# Patient Record
Sex: Female | Born: 1935 | Race: Black or African American | Hispanic: No | Smoking: Former smoker
Health system: Southern US, Community
[De-identification: ages and names within clinical notes are randomized; demographics above are authoritative.]

## PROBLEM LIST (undated history)

## (undated) DIAGNOSIS — K5792 Diverticulitis of intestine, part unspecified, without perforation or abscess without bleeding: Secondary | ICD-10-CM

## (undated) DIAGNOSIS — I350 Nonrheumatic aortic (valve) stenosis: Secondary | ICD-10-CM

## (undated) DIAGNOSIS — D649 Anemia, unspecified: Secondary | ICD-10-CM

## (undated) DIAGNOSIS — R351 Nocturia: Secondary | ICD-10-CM

## (undated) DIAGNOSIS — M2550 Pain in unspecified joint: Secondary | ICD-10-CM

## (undated) DIAGNOSIS — N183 Chronic kidney disease, stage 3 unspecified: Secondary | ICD-10-CM

## (undated) DIAGNOSIS — Z9289 Personal history of other medical treatment: Secondary | ICD-10-CM

## (undated) DIAGNOSIS — IMO0001 Reserved for inherently not codable concepts without codable children: Secondary | ICD-10-CM

## (undated) DIAGNOSIS — K219 Gastro-esophageal reflux disease without esophagitis: Secondary | ICD-10-CM

## (undated) DIAGNOSIS — R609 Edema, unspecified: Secondary | ICD-10-CM

## (undated) DIAGNOSIS — R39198 Other difficulties with micturition: Secondary | ICD-10-CM

## (undated) DIAGNOSIS — M2540 Effusion, unspecified joint: Secondary | ICD-10-CM

## (undated) DIAGNOSIS — M1990 Unspecified osteoarthritis, unspecified site: Secondary | ICD-10-CM

## (undated) DIAGNOSIS — E7800 Pure hypercholesterolemia, unspecified: Secondary | ICD-10-CM

## (undated) DIAGNOSIS — R600 Localized edema: Secondary | ICD-10-CM

## (undated) DIAGNOSIS — Z8719 Personal history of other diseases of the digestive system: Secondary | ICD-10-CM

## (undated) DIAGNOSIS — K921 Melena: Secondary | ICD-10-CM

## (undated) DIAGNOSIS — I10 Essential (primary) hypertension: Secondary | ICD-10-CM

## (undated) DIAGNOSIS — R200 Anesthesia of skin: Secondary | ICD-10-CM

## (undated) HISTORY — PX: ABCESS DRAINAGE: SHX399

## (undated) HISTORY — PX: OTHER SURGICAL HISTORY: SHX169

## (undated) HISTORY — PX: CHOLECYSTECTOMY: SHX55

## (undated) HISTORY — PX: DILATION AND CURETTAGE OF UTERUS: SHX78

---

## 1997-05-21 ENCOUNTER — Other Ambulatory Visit: Admission: RE | Admit: 1997-05-21 | Discharge: 1997-05-21 | Payer: Self-pay | Admitting: Family Medicine

## 1997-06-28 ENCOUNTER — Ambulatory Visit (HOSPITAL_COMMUNITY): Admission: RE | Admit: 1997-06-28 | Discharge: 1997-06-28 | Payer: Self-pay | Admitting: Family Medicine

## 1997-06-28 ENCOUNTER — Other Ambulatory Visit: Admission: RE | Admit: 1997-06-28 | Discharge: 1997-06-28 | Payer: Self-pay | Admitting: Family Medicine

## 1997-08-04 ENCOUNTER — Ambulatory Visit (HOSPITAL_COMMUNITY): Admission: RE | Admit: 1997-08-04 | Discharge: 1997-08-04 | Payer: Self-pay | Admitting: Family Medicine

## 1997-08-27 ENCOUNTER — Encounter: Admission: RE | Admit: 1997-08-27 | Discharge: 1997-11-25 | Payer: Self-pay | Admitting: Specialist

## 1998-03-09 ENCOUNTER — Inpatient Hospital Stay (HOSPITAL_COMMUNITY): Admission: EM | Admit: 1998-03-09 | Discharge: 1998-03-13 | Payer: Self-pay | Admitting: Emergency Medicine

## 1998-03-09 ENCOUNTER — Encounter: Payer: Self-pay | Admitting: Emergency Medicine

## 1998-03-11 ENCOUNTER — Encounter: Payer: Self-pay | Admitting: Internal Medicine

## 1998-05-26 ENCOUNTER — Encounter: Payer: Self-pay | Admitting: Internal Medicine

## 1998-05-26 ENCOUNTER — Ambulatory Visit (HOSPITAL_COMMUNITY): Admission: RE | Admit: 1998-05-26 | Discharge: 1998-05-26 | Payer: Self-pay | Admitting: Internal Medicine

## 1998-10-04 ENCOUNTER — Ambulatory Visit (HOSPITAL_COMMUNITY): Admission: RE | Admit: 1998-10-04 | Discharge: 1998-10-04 | Payer: Self-pay | Admitting: Gastroenterology

## 1999-02-25 ENCOUNTER — Emergency Department (HOSPITAL_COMMUNITY): Admission: EM | Admit: 1999-02-25 | Discharge: 1999-02-25 | Payer: Self-pay | Admitting: Emergency Medicine

## 2001-04-26 ENCOUNTER — Inpatient Hospital Stay (HOSPITAL_COMMUNITY): Admission: EM | Admit: 2001-04-26 | Discharge: 2001-04-27 | Payer: Self-pay

## 2001-05-14 ENCOUNTER — Encounter: Payer: Self-pay | Admitting: Internal Medicine

## 2001-05-14 ENCOUNTER — Ambulatory Visit (HOSPITAL_COMMUNITY): Admission: RE | Admit: 2001-05-14 | Discharge: 2001-05-14 | Payer: Self-pay | Admitting: Internal Medicine

## 2001-05-15 ENCOUNTER — Encounter: Admission: RE | Admit: 2001-05-15 | Discharge: 2001-05-15 | Payer: Self-pay | Admitting: Family Medicine

## 2001-05-21 ENCOUNTER — Encounter: Payer: Self-pay | Admitting: Cardiology

## 2001-05-21 ENCOUNTER — Ambulatory Visit (HOSPITAL_COMMUNITY): Admission: RE | Admit: 2001-05-21 | Discharge: 2001-05-22 | Payer: Self-pay | Admitting: Cardiology

## 2001-10-30 ENCOUNTER — Encounter: Admission: RE | Admit: 2001-10-30 | Discharge: 2001-11-25 | Payer: Self-pay | Admitting: Internal Medicine

## 2002-10-02 ENCOUNTER — Encounter: Admission: RE | Admit: 2002-10-02 | Discharge: 2002-10-02 | Payer: Self-pay | Admitting: Specialist

## 2002-10-02 ENCOUNTER — Encounter: Payer: Self-pay | Admitting: Specialist

## 2002-11-19 ENCOUNTER — Ambulatory Visit (HOSPITAL_COMMUNITY): Admission: RE | Admit: 2002-11-19 | Discharge: 2002-11-19 | Payer: Self-pay | Admitting: Internal Medicine

## 2002-11-19 ENCOUNTER — Encounter: Payer: Self-pay | Admitting: Internal Medicine

## 2003-05-31 ENCOUNTER — Emergency Department (HOSPITAL_COMMUNITY): Admission: EM | Admit: 2003-05-31 | Discharge: 2003-05-31 | Payer: Self-pay | Admitting: Family Medicine

## 2003-06-01 ENCOUNTER — Emergency Department (HOSPITAL_COMMUNITY): Admission: EM | Admit: 2003-06-01 | Discharge: 2003-06-01 | Payer: Self-pay | Admitting: Family Medicine

## 2003-06-04 ENCOUNTER — Emergency Department (HOSPITAL_COMMUNITY): Admission: EM | Admit: 2003-06-04 | Discharge: 2003-06-04 | Payer: Self-pay | Admitting: Family Medicine

## 2003-09-14 ENCOUNTER — Ambulatory Visit (HOSPITAL_COMMUNITY): Admission: RE | Admit: 2003-09-14 | Discharge: 2003-09-14 | Payer: Self-pay | Admitting: Internal Medicine

## 2003-12-21 ENCOUNTER — Ambulatory Visit: Payer: Self-pay | Admitting: Nurse Practitioner

## 2003-12-21 ENCOUNTER — Ambulatory Visit (HOSPITAL_COMMUNITY): Admission: RE | Admit: 2003-12-21 | Discharge: 2003-12-21 | Payer: Self-pay | Admitting: Nurse Practitioner

## 2004-01-20 ENCOUNTER — Ambulatory Visit: Payer: Self-pay | Admitting: Nurse Practitioner

## 2004-02-15 ENCOUNTER — Ambulatory Visit: Payer: Self-pay | Admitting: Nurse Practitioner

## 2004-04-21 ENCOUNTER — Ambulatory Visit (HOSPITAL_COMMUNITY): Admission: RE | Admit: 2004-04-21 | Discharge: 2004-04-21 | Payer: Self-pay | Admitting: Nurse Practitioner

## 2004-10-25 ENCOUNTER — Observation Stay (HOSPITAL_COMMUNITY): Admission: EM | Admit: 2004-10-25 | Discharge: 2004-10-27 | Payer: Self-pay | Admitting: Emergency Medicine

## 2005-01-08 ENCOUNTER — Encounter: Admission: RE | Admit: 2005-01-08 | Discharge: 2005-01-08 | Payer: Self-pay | Admitting: Cardiovascular Disease

## 2005-01-11 ENCOUNTER — Encounter: Admission: RE | Admit: 2005-01-11 | Discharge: 2005-01-11 | Payer: Self-pay | Admitting: Gastroenterology

## 2005-03-10 ENCOUNTER — Emergency Department (HOSPITAL_COMMUNITY): Admission: EM | Admit: 2005-03-10 | Discharge: 2005-03-10 | Payer: Self-pay | Admitting: Emergency Medicine

## 2005-04-11 ENCOUNTER — Ambulatory Visit (HOSPITAL_COMMUNITY): Admission: RE | Admit: 2005-04-11 | Discharge: 2005-04-11 | Payer: Self-pay | Admitting: Cardiovascular Disease

## 2005-05-22 ENCOUNTER — Ambulatory Visit (HOSPITAL_COMMUNITY): Admission: RE | Admit: 2005-05-22 | Discharge: 2005-05-23 | Payer: Self-pay | Admitting: General Surgery

## 2005-05-22 ENCOUNTER — Encounter (INDEPENDENT_AMBULATORY_CARE_PROVIDER_SITE_OTHER): Payer: Self-pay | Admitting: *Deleted

## 2005-09-21 ENCOUNTER — Ambulatory Visit: Payer: Self-pay | Admitting: Nurse Practitioner

## 2005-12-03 ENCOUNTER — Inpatient Hospital Stay (HOSPITAL_COMMUNITY): Admission: EM | Admit: 2005-12-03 | Discharge: 2005-12-05 | Payer: Self-pay | Admitting: Emergency Medicine

## 2006-05-26 ENCOUNTER — Inpatient Hospital Stay (HOSPITAL_COMMUNITY): Admission: EM | Admit: 2006-05-26 | Discharge: 2006-05-28 | Payer: Self-pay | Admitting: Emergency Medicine

## 2006-05-27 ENCOUNTER — Encounter (INDEPENDENT_AMBULATORY_CARE_PROVIDER_SITE_OTHER): Payer: Self-pay | Admitting: Specialist

## 2007-01-21 ENCOUNTER — Encounter: Admission: RE | Admit: 2007-01-21 | Discharge: 2007-01-21 | Payer: Self-pay | Admitting: Cardiovascular Disease

## 2007-03-29 ENCOUNTER — Observation Stay (HOSPITAL_COMMUNITY): Admission: EM | Admit: 2007-03-29 | Discharge: 2007-04-01 | Payer: Self-pay | Admitting: Emergency Medicine

## 2007-03-29 ENCOUNTER — Encounter: Admission: RE | Admit: 2007-03-29 | Discharge: 2007-03-29 | Payer: Self-pay | Admitting: Cardiovascular Disease

## 2007-08-25 ENCOUNTER — Encounter: Admission: RE | Admit: 2007-08-25 | Discharge: 2007-08-25 | Payer: Self-pay | Admitting: Cardiovascular Disease

## 2007-10-03 ENCOUNTER — Ambulatory Visit (HOSPITAL_COMMUNITY): Admission: RE | Admit: 2007-10-03 | Discharge: 2007-10-04 | Payer: Self-pay | Admitting: Cardiovascular Disease

## 2007-12-22 ENCOUNTER — Inpatient Hospital Stay (HOSPITAL_COMMUNITY): Admission: AD | Admit: 2007-12-22 | Discharge: 2007-12-26 | Payer: Self-pay | Admitting: Cardiovascular Disease

## 2007-12-24 ENCOUNTER — Encounter (INDEPENDENT_AMBULATORY_CARE_PROVIDER_SITE_OTHER): Payer: Self-pay | Admitting: Gastroenterology

## 2008-04-23 ENCOUNTER — Emergency Department (HOSPITAL_COMMUNITY): Admission: EM | Admit: 2008-04-23 | Discharge: 2008-04-23 | Payer: Self-pay | Admitting: Emergency Medicine

## 2008-05-06 ENCOUNTER — Inpatient Hospital Stay (HOSPITAL_COMMUNITY): Admission: EM | Admit: 2008-05-06 | Discharge: 2008-05-12 | Payer: Self-pay | Admitting: Emergency Medicine

## 2008-05-07 ENCOUNTER — Encounter (INDEPENDENT_AMBULATORY_CARE_PROVIDER_SITE_OTHER): Payer: Self-pay | Admitting: Cardiovascular Disease

## 2008-05-11 ENCOUNTER — Encounter (INDEPENDENT_AMBULATORY_CARE_PROVIDER_SITE_OTHER): Payer: Self-pay | Admitting: Gastroenterology

## 2008-07-30 ENCOUNTER — Ambulatory Visit (HOSPITAL_COMMUNITY): Admission: RE | Admit: 2008-07-30 | Discharge: 2008-07-30 | Payer: Self-pay | Admitting: *Deleted

## 2008-07-30 ENCOUNTER — Encounter (INDEPENDENT_AMBULATORY_CARE_PROVIDER_SITE_OTHER): Payer: Self-pay | Admitting: Gastroenterology

## 2009-01-05 ENCOUNTER — Inpatient Hospital Stay (HOSPITAL_COMMUNITY): Admission: EM | Admit: 2009-01-05 | Discharge: 2009-01-08 | Payer: Self-pay | Admitting: Emergency Medicine

## 2009-05-11 ENCOUNTER — Encounter: Admission: RE | Admit: 2009-05-11 | Discharge: 2009-05-11 | Payer: Self-pay | Admitting: Cardiovascular Disease

## 2009-05-21 ENCOUNTER — Encounter: Admission: RE | Admit: 2009-05-21 | Discharge: 2009-05-21 | Payer: Self-pay | Admitting: Cardiovascular Disease

## 2010-01-19 ENCOUNTER — Emergency Department (HOSPITAL_COMMUNITY): Admission: EM | Admit: 2010-01-19 | Discharge: 2009-11-20 | Payer: Self-pay | Admitting: Emergency Medicine

## 2010-04-26 ENCOUNTER — Other Ambulatory Visit: Payer: Self-pay | Admitting: Gastroenterology

## 2010-04-26 DIAGNOSIS — R195 Other fecal abnormalities: Secondary | ICD-10-CM

## 2010-04-27 LAB — POCT I-STAT, CHEM 8
BUN: 19 mg/dL (ref 6–23)
Calcium, Ion: 1.11 mmol/L — ABNORMAL LOW (ref 1.12–1.32)
Chloride: 105 mEq/L (ref 96–112)
Creatinine, Ser: 1.5 mg/dL — ABNORMAL HIGH (ref 0.4–1.2)
Glucose, Bld: 115 mg/dL — ABNORMAL HIGH (ref 70–99)
HCT: 35 % — ABNORMAL LOW (ref 36.0–46.0)
Hemoglobin: 11.9 g/dL — ABNORMAL LOW (ref 12.0–15.0)
Potassium: 3.5 mEq/L (ref 3.5–5.1)
Sodium: 140 mEq/L (ref 135–145)
TCO2: 24 mmol/L (ref 0–100)

## 2010-04-27 LAB — POCT CARDIAC MARKERS
CKMB, poc: <1 ng/mL — ABNORMAL LOW (ref 1.0–8.0)
Myoglobin, poc: 62 ng/mL (ref 12–200)
Troponin i, poc: <0.05 ng/mL (ref 0.00–0.09)

## 2010-05-02 ENCOUNTER — Ambulatory Visit
Admission: RE | Admit: 2010-05-02 | Discharge: 2010-05-02 | Disposition: A | Discharge status: Home / Self Care | Payer: Medicare Other | Source: Ambulatory Visit | Attending: Gastroenterology | Admitting: Gastroenterology

## 2010-05-02 ENCOUNTER — Other Ambulatory Visit: Payer: Self-pay | Admitting: Gastroenterology

## 2010-05-02 DIAGNOSIS — R195 Other fecal abnormalities: Secondary | ICD-10-CM

## 2010-05-17 LAB — LIPID PANEL
Cholesterol: 209 mg/dL — ABNORMAL HIGH (ref 0–200)
HDL: 56 mg/dL (ref >39)
LDL Cholesterol: 143 mg/dL — ABNORMAL HIGH (ref 0–99)
Total CHOL/HDL Ratio: 3.7 RATIO
Triglycerides: 51 mg/dL (ref <150)
VLDL: 10 mg/dL (ref 0–40)

## 2010-05-17 LAB — GLUCOSE, CAPILLARY
Glucose-Capillary: 119 mg/dL — ABNORMAL HIGH (ref 70–99)
Glucose-Capillary: 124 mg/dL — ABNORMAL HIGH (ref 70–99)
Glucose-Capillary: 158 mg/dL — ABNORMAL HIGH (ref 70–99)
Glucose-Capillary: 82 mg/dL (ref 70–99)
Glucose-Capillary: 86 mg/dL (ref 70–99)
Glucose-Capillary: 95 mg/dL (ref 70–99)
Glucose-Capillary: 98 mg/dL (ref 70–99)

## 2010-05-17 LAB — COMPREHENSIVE METABOLIC PANEL
ALT: 13 U/L (ref 0–35)
AST: 20 U/L (ref 0–37)
Albumin: 3.5 g/dL (ref 3.5–5.2)
Alkaline Phosphatase: 71 U/L (ref 39–117)
BUN: 18 mg/dL (ref 6–23)
CO2: 29 mEq/L (ref 19–32)
Calcium: 8.9 mg/dL (ref 8.4–10.5)
Chloride: 105 mEq/L (ref 96–112)
Creatinine, Ser: 1.25 mg/dL — ABNORMAL HIGH (ref 0.4–1.2)
GFR calc non Af Amer: 41 mL/min — ABNORMAL LOW (ref >60)
Glucose, Bld: 110 mg/dL — ABNORMAL HIGH (ref 70–99)
Potassium: 4.8 mEq/L (ref 3.5–5.1)
Sodium: 140 mEq/L (ref 135–145)
Total Bilirubin: 0.5 mg/dL (ref 0.3–1.2)
Total Protein: 7.1 g/dL (ref 6.0–8.3)

## 2010-05-17 LAB — CBC
HCT: 29.6 % — ABNORMAL LOW (ref 36.0–46.0)
HCT: 30.6 % — ABNORMAL LOW (ref 36.0–46.0)
HCT: 34.4 % — ABNORMAL LOW (ref 36.0–46.0)
Hemoglobin: 10.4 g/dL — ABNORMAL LOW (ref 12.0–15.0)
Hemoglobin: 11.6 g/dL — ABNORMAL LOW (ref 12.0–15.0)
Hemoglobin: 9.8 g/dL — ABNORMAL LOW (ref 12.0–15.0)
MCHC: 33 g/dL (ref 30.0–36.0)
MCHC: 33.8 g/dL (ref 30.0–36.0)
MCHC: 34 g/dL (ref 30.0–36.0)
MCV: 92.3 fL (ref 78.0–100.0)
MCV: 92.6 fL (ref 78.0–100.0)
MCV: 93.3 fL (ref 78.0–100.0)
Platelets: 251 10*3/uL (ref 150–400)
Platelets: 258 10*3/uL (ref 150–400)
Platelets: 270 10*3/uL (ref 150–400)
RBC: 3.17 MIL/uL — ABNORMAL LOW (ref 3.87–5.11)
RBC: 3.32 MIL/uL — ABNORMAL LOW (ref 3.87–5.11)
RBC: 3.72 MIL/uL — ABNORMAL LOW (ref 3.87–5.11)
RDW: 14.8 % (ref 11.5–15.5)
RDW: 14.8 % (ref 11.5–15.5)
RDW: 14.9 % (ref 11.5–15.5)
WBC: 4 10*3/uL (ref 4.0–10.5)
WBC: 4 10*3/uL (ref 4.0–10.5)
WBC: 4.2 10*3/uL (ref 4.0–10.5)

## 2010-05-17 LAB — CK TOTAL AND CKMB (NOT AT ARMC)
CK, MB: 0.7 ng/mL (ref 0.3–4.0)
CK, MB: 0.7 ng/mL (ref 0.3–4.0)
CK, MB: 0.9 ng/mL (ref 0.3–4.0)
Relative Index: INVALID (ref 0.0–2.5)
Relative Index: INVALID (ref 0.0–2.5)
Total CK: 46 U/L (ref 7–177)
Total CK: 57 U/L (ref 7–177)
Total CK: 62 U/L (ref 7–177)

## 2010-05-17 LAB — PROTIME-INR
INR: 1 (ref 0.00–1.49)
Prothrombin Time: 13.1 seconds (ref 11.6–15.2)

## 2010-05-17 LAB — BASIC METABOLIC PANEL
BUN: 18 mg/dL (ref 6–23)
BUN: 20 mg/dL (ref 6–23)
CO2: 25 mEq/L (ref 19–32)
CO2: 28 mEq/L (ref 19–32)
Calcium: 8.7 mg/dL (ref 8.4–10.5)
Calcium: 9 mg/dL (ref 8.4–10.5)
Chloride: 104 mEq/L (ref 96–112)
Chloride: 104 mEq/L (ref 96–112)
Creatinine, Ser: 1.18 mg/dL (ref 0.4–1.2)
Creatinine, Ser: 1.28 mg/dL — ABNORMAL HIGH (ref 0.4–1.2)
GFR calc Af Amer: 53 mL/min — ABNORMAL LOW (ref >60)
GFR calc non Af Amer: 40 mL/min — ABNORMAL LOW (ref >60)
GFR calc non Af Amer: 44 mL/min — ABNORMAL LOW (ref >60)
Glucose, Bld: 117 mg/dL — ABNORMAL HIGH (ref 70–99)
Glucose, Bld: 158 mg/dL — ABNORMAL HIGH (ref 70–99)
Potassium: 3.6 mEq/L (ref 3.5–5.1)
Potassium: 3.9 mEq/L (ref 3.5–5.1)
Sodium: 137 mEq/L (ref 135–145)
Sodium: 138 mEq/L (ref 135–145)

## 2010-05-17 LAB — APTT

## 2010-05-17 LAB — RETICULOCYTES
RBC.: 3.39 MIL/uL — ABNORMAL LOW (ref 3.87–5.11)
Retic Count, Absolute: 27.1 10*3/uL (ref 19.0–186.0)
Retic Ct Pct: 0.8 % (ref 0.4–3.1)

## 2010-05-17 LAB — DIFFERENTIAL
Basophils Absolute: 0 10*3/uL (ref 0.0–0.1)
Basophils Relative: 1 % (ref 0–1)
Eosinophils Absolute: 0.2 10*3/uL (ref 0.0–0.7)
Eosinophils Relative: 4 % (ref 0–5)
Lymphocytes Relative: 31 % (ref 12–46)
Lymphs Abs: 1.3 10*3/uL (ref 0.7–4.0)
Monocytes Absolute: 0.3 10*3/uL (ref 0.1–1.0)
Monocytes Relative: 8 % (ref 3–12)
Neutro Abs: 2.4 10*3/uL (ref 1.7–7.7)
Neutrophils Relative %: 57 % (ref 43–77)

## 2010-05-17 LAB — FERRITIN: Ferritin: 57 ng/mL (ref 10–291)

## 2010-05-17 LAB — TROPONIN I
Troponin I: 0.02 ng/mL (ref 0.00–0.06)
Troponin I: 0.02 ng/mL (ref 0.00–0.06)

## 2010-05-17 LAB — D-DIMER, QUANTITATIVE

## 2010-05-17 LAB — HEMOCCULT GUIAC POC 1CARD (OFFICE): Fecal Occult Bld: POSITIVE

## 2010-05-17 LAB — IRON AND TIBC
Iron: 59 ug/dL (ref 42–135)
Saturation Ratios: 26 % (ref 20–55)
TIBC: 227 ug/dL — ABNORMAL LOW (ref 250–470)
UIBC: 168 ug/dL

## 2010-05-17 LAB — HEPARIN LEVEL (UNFRACTIONATED)
Heparin Unfractionated: 0.67 IU/mL (ref 0.30–0.70)
Heparin Unfractionated: 0.75 IU/mL — ABNORMAL HIGH (ref 0.30–0.70)
Heparin Unfractionated: 1.03 IU/mL — ABNORMAL HIGH (ref 0.30–0.70)

## 2010-05-17 LAB — BRAIN NATRIURETIC PEPTIDE: Pro B Natriuretic peptide (BNP): 67 pg/mL (ref 0.0–100.0)

## 2010-05-17 LAB — HEMOGLOBIN A1C: Mean Plasma Glucose: 131 mg/dL

## 2010-05-17 LAB — VITAMIN B12: Vitamin B-12: 457 pg/mL (ref 211–911)

## 2010-05-17 LAB — FOLATE: Folate: 16.1 ng/mL

## 2010-05-25 LAB — DIFFERENTIAL
Basophils Absolute: 0 10*3/uL (ref 0.0–0.1)
Basophils Absolute: 0.1 10*3/uL (ref 0.0–0.1)
Basophils Relative: 1 % (ref 0–1)
Basophils Relative: 1 % (ref 0–1)
Eosinophils Absolute: 0.2 10*3/uL (ref 0.0–0.7)
Eosinophils Absolute: 0.1 10*3/uL (ref 0.0–0.7)
Eosinophils Relative: 3 % (ref 0–5)
Eosinophils Relative: 2 % (ref 0–5)
Lymphocytes Relative: 25 % (ref 12–46)
Lymphocytes Relative: 34 % (ref 12–46)
Lymphs Abs: 1.3 10*3/uL (ref 0.7–4.0)
Lymphs Abs: 1.8 10*3/uL (ref 0.7–4.0)
Monocytes Absolute: 0.3 10*3/uL (ref 0.1–1.0)
Monocytes Absolute: 0.4 10*3/uL (ref 0.1–1.0)
Monocytes Relative: 6 % (ref 3–12)
Monocytes Relative: 8 % (ref 3–12)
Neutro Abs: 3.4 10*3/uL (ref 1.7–7.7)
Neutro Abs: 2.8 10*3/uL (ref 1.7–7.7)
Neutrophils Relative %: 66 % (ref 43–77)
Neutrophils Relative %: 55 % (ref 43–77)

## 2010-05-25 LAB — CBC
HCT: 26 % — ABNORMAL LOW (ref 36.0–46.0)
HCT: 26.6 % — ABNORMAL LOW (ref 36.0–46.0)
HCT: 28.2 % — ABNORMAL LOW (ref 36.0–46.0)
HCT: 35.5 % — ABNORMAL LOW (ref 36.0–46.0)
HCT: 33.1 % — ABNORMAL LOW (ref 36.0–46.0)
Hemoglobin: 11.8 g/dL — ABNORMAL LOW (ref 12.0–15.0)
Hemoglobin: 8.8 g/dL — ABNORMAL LOW (ref 12.0–15.0)
Hemoglobin: 9 g/dL — ABNORMAL LOW (ref 12.0–15.0)
Hemoglobin: 9.6 g/dL — ABNORMAL LOW (ref 12.0–15.0)
Hemoglobin: 11.2 g/dL — ABNORMAL LOW (ref 12.0–15.0)
MCHC: 33.3 g/dL (ref 30.0–36.0)
MCHC: 33.9 g/dL (ref 30.0–36.0)
MCHC: 33.9 g/dL (ref 30.0–36.0)
MCHC: 34 g/dL (ref 30.0–36.0)
MCHC: 33.8 g/dL (ref 30.0–36.0)
MCV: 91.8 fL (ref 78.0–100.0)
MCV: 92.2 fL (ref 78.0–100.0)
MCV: 92.3 fL (ref 78.0–100.0)
MCV: 92.6 fL (ref 78.0–100.0)
MCV: 92.1 fL (ref 78.0–100.0)
Platelets: 255 10*3/uL (ref 150–400)
Platelets: 255 10*3/uL (ref 150–400)
Platelets: 265 10*3/uL (ref 150–400)
Platelets: 307 10*3/uL (ref 150–400)
Platelets: 319 10*3/uL (ref 150–400)
RBC: 2.82 MIL/uL — ABNORMAL LOW (ref 3.87–5.11)
RBC: 2.87 MIL/uL — ABNORMAL LOW (ref 3.87–5.11)
RBC: 3.07 MIL/uL — ABNORMAL LOW (ref 3.87–5.11)
RBC: 3.85 MIL/uL — ABNORMAL LOW (ref 3.87–5.11)
RBC: 3.6 MIL/uL — ABNORMAL LOW (ref 3.87–5.11)
RDW: 14.3 % (ref 11.5–15.5)
RDW: 14.5 % (ref 11.5–15.5)
RDW: 14.7 % (ref 11.5–15.5)
RDW: 15.3 % (ref 11.5–15.5)
RDW: 14.9 % (ref 11.5–15.5)
WBC: 4.3 10*3/uL (ref 4.0–10.5)
WBC: 4.3 10*3/uL (ref 4.0–10.5)
WBC: 4.4 10*3/uL (ref 4.0–10.5)
WBC: 5.1 10*3/uL (ref 4.0–10.5)
WBC: 5.2 10*3/uL (ref 4.0–10.5)

## 2010-05-25 LAB — BASIC METABOLIC PANEL
BUN: 15 mg/dL (ref 6–23)
BUN: 17 mg/dL (ref 6–23)
CO2: 28 mEq/L (ref 19–32)
CO2: 28 mEq/L (ref 19–32)
Calcium: 8.7 mg/dL (ref 8.4–10.5)
Calcium: 9.1 mg/dL (ref 8.4–10.5)
Chloride: 106 mEq/L (ref 96–112)
Chloride: 109 mEq/L (ref 96–112)
Creatinine, Ser: 1.03 mg/dL (ref 0.4–1.2)
Creatinine, Ser: 1.13 mg/dL (ref 0.4–1.2)
GFR calc Af Amer: 56 mL/min — ABNORMAL LOW (ref >60)
GFR calc Af Amer: >60 mL/min (ref >60)
GFR calc non Af Amer: 46 mL/min — ABNORMAL LOW (ref >60)
GFR calc non Af Amer: 51 mL/min — ABNORMAL LOW (ref >60)
Glucose, Bld: 102 mg/dL — ABNORMAL HIGH (ref 70–99)
Glucose, Bld: 90 mg/dL (ref 70–99)
Potassium: 4.1 mEq/L (ref 3.5–5.1)
Potassium: 4.5 mEq/L (ref 3.5–5.1)
Sodium: 141 mEq/L (ref 135–145)
Sodium: 141 mEq/L (ref 135–145)

## 2010-05-25 LAB — LIPID PANEL
Cholesterol: 226 mg/dL — ABNORMAL HIGH (ref 0–200)
HDL: 54 mg/dL (ref >39)
LDL Cholesterol: 154 mg/dL — ABNORMAL HIGH (ref 0–99)
Total CHOL/HDL Ratio: 4.2 RATIO
Triglycerides: 92 mg/dL (ref <150)
VLDL: 18 mg/dL (ref 0–40)

## 2010-05-25 LAB — COMPREHENSIVE METABOLIC PANEL
ALT: 11 U/L (ref 0–35)
ALT: 10 U/L (ref 0–35)
AST: 21 U/L (ref 0–37)
AST: 19 U/L (ref 0–37)
Albumin: 3.7 g/dL (ref 3.5–5.2)
Albumin: 3.5 g/dL (ref 3.5–5.2)
Alkaline Phosphatase: 77 U/L (ref 39–117)
Alkaline Phosphatase: 76 U/L (ref 39–117)
BUN: 13 mg/dL (ref 6–23)
BUN: 19 mg/dL (ref 6–23)
CO2: 25 mEq/L (ref 19–32)
CO2: 26 mEq/L (ref 19–32)
Calcium: 9.6 mg/dL (ref 8.4–10.5)
Calcium: 8.9 mg/dL (ref 8.4–10.5)
Chloride: 105 mEq/L (ref 96–112)
Chloride: 103 mEq/L (ref 96–112)
Creatinine, Ser: 0.98 mg/dL (ref 0.4–1.2)
Creatinine, Ser: 1.35 mg/dL — ABNORMAL HIGH (ref 0.4–1.2)
GFR calc Af Amer: >60 mL/min (ref >60)
GFR calc Af Amer: 45 mL/min — ABNORMAL LOW (ref >60)
GFR calc non Af Amer: 54 mL/min — ABNORMAL LOW (ref >60)
GFR calc non Af Amer: 38 mL/min — ABNORMAL LOW (ref >60)
Glucose, Bld: 112 mg/dL — ABNORMAL HIGH (ref 70–99)
Glucose, Bld: 100 mg/dL — ABNORMAL HIGH (ref 70–99)
Potassium: 3.7 mEq/L (ref 3.5–5.1)
Potassium: 4.7 mEq/L (ref 3.5–5.1)
Sodium: 137 mEq/L (ref 135–145)
Sodium: 137 mEq/L (ref 135–145)
Total Bilirubin: 0.5 mg/dL (ref 0.3–1.2)
Total Bilirubin: 0.3 mg/dL (ref 0.3–1.2)
Total Protein: 6.8 g/dL (ref 6.0–8.3)
Total Protein: 6.7 g/dL (ref 6.0–8.3)

## 2010-05-25 LAB — URINALYSIS, ROUTINE W REFLEX MICROSCOPIC
Bilirubin Urine: NEGATIVE
Glucose, UA: NEGATIVE mg/dL
Hgb urine dipstick: NEGATIVE
Ketones, ur: NEGATIVE mg/dL
Nitrite: NEGATIVE
Protein, ur: NEGATIVE mg/dL
Specific Gravity, Urine: 1.006 (ref 1.005–1.030)
Urobilinogen, UA: 0.2 mg/dL (ref 0.0–1.0)
pH: 6.5 (ref 5.0–8.0)

## 2010-05-25 LAB — URINE CULTURE: Colony Count: 9000

## 2010-05-25 LAB — POCT I-STAT, CHEM 8
BUN: 17 mg/dL (ref 6–23)
BUN: 23 mg/dL (ref 6–23)
Calcium, Ion: 1.21 mmol/L (ref 1.12–1.32)
Calcium, Ion: 1.14 mmol/L (ref 1.12–1.32)
Chloride: 106 mEq/L (ref 96–112)
Chloride: 105 mEq/L (ref 96–112)
Creatinine, Ser: 1.2 mg/dL (ref 0.4–1.2)
Creatinine, Ser: 1.7 mg/dL — ABNORMAL HIGH (ref 0.4–1.2)
Glucose, Bld: 84 mg/dL (ref 70–99)
Glucose, Bld: 100 mg/dL — ABNORMAL HIGH (ref 70–99)
HCT: 33 % — ABNORMAL LOW (ref 36.0–46.0)
HCT: 35 % — ABNORMAL LOW (ref 36.0–46.0)
Hemoglobin: 11.2 g/dL — ABNORMAL LOW (ref 12.0–15.0)
Hemoglobin: 11.9 g/dL — ABNORMAL LOW (ref 12.0–15.0)
Potassium: 4.9 mEq/L (ref 3.5–5.1)
Potassium: 4.8 mEq/L (ref 3.5–5.1)
Sodium: 139 mEq/L (ref 135–145)
Sodium: 137 mEq/L (ref 135–145)
TCO2: 25 mmol/L (ref 0–100)
TCO2: 26 mmol/L (ref 0–100)

## 2010-05-25 LAB — POCT CARDIAC MARKERS
CKMB, poc: <1 ng/mL — ABNORMAL LOW (ref 1.0–8.0)
Myoglobin, poc: 58.6 ng/mL (ref 12–200)
Troponin i, poc: <0.05 ng/mL (ref 0.00–0.09)

## 2010-05-25 LAB — CARDIAC PANEL(CRET KIN+CKTOT+MB+TROPI)
CK, MB: 0.6 ng/mL (ref 0.3–4.0)
CK, MB: 0.7 ng/mL (ref 0.3–4.0)
CK, MB: 0.8 ng/mL (ref 0.3–4.0)
Relative Index: INVALID (ref 0.0–2.5)
Relative Index: INVALID (ref 0.0–2.5)
Relative Index: INVALID (ref 0.0–2.5)
Total CK: 41 U/L (ref 7–177)
Total CK: 52 U/L (ref 7–177)
Total CK: 82 U/L (ref 7–177)
Troponin I: <0.01 ng/mL (ref 0.00–0.06)
Troponin I: <0.01 ng/mL (ref 0.00–0.06)
Troponin I: <0.01 ng/mL (ref 0.00–0.06)

## 2010-05-25 LAB — TSH: TSH: 1.144 u[IU]/mL (ref 0.350–4.500)

## 2010-05-25 LAB — HEMOCCULT GUIAC POC 1CARD (OFFICE)
Fecal Occult Bld: NEGATIVE
Fecal Occult Bld: POSITIVE
Fecal Occult Bld: POSITIVE

## 2010-05-25 LAB — BRAIN NATRIURETIC PEPTIDE: Pro B Natriuretic peptide (BNP): 97 pg/mL (ref 0.0–100.0)

## 2010-05-25 LAB — PROTIME-INR
INR: 1 (ref 0.00–1.49)
INR: 1 (ref 0.00–1.49)
Prothrombin Time: 13 seconds (ref 11.6–15.2)
Prothrombin Time: 12.9 seconds (ref 11.6–15.2)

## 2010-05-25 LAB — APTT: aPTT: 31 seconds (ref 24–37)

## 2010-06-27 NOTE — Op Note (Signed)
NAME:  DOMINIQUE, RESSEL                ACCOUNT NO.:  [id number]   MEDICAL RECORD NO.:  [id number]          PATIENT TYPE:  AMB   LOCATION:  ENDO                         FACILITY:  [facility]   PHYSICIAN:  James L. Malon Kindle., M.D.DATE OF BIRTH:  [birth year]   DATE OF PROCEDURE:  07/30/2008  DATE OF DISCHARGE:  07/30/2008                               OPERATIVE REPORT   PROCEDURE:  Colonoscopy and biopsy.   MEDICATIONS:  1. Fentanyl 100 mcg.  2. Versed 10 mg IV.   INDICATION:  An 77 year old woman with heme-positive stools, has known  severe diverticular disease.  Her hemoglobin had come back up with iron,  but her stools had remained positive.  Colonoscopy in 2008 revealed a  lipoma in the ascending colon and possibly AVMs.  Due to her continued  positive stools, colonoscopy is repeated.   DESCRIPTION OF PROCEDURE:  Procedure explained to the patient and  consent obtained.  In the left lateral decubitus position, the pediatric  colonoscope was inserted and advanced.  The prep was adequate and really  fairly good.  There were just some retained hard balls of stool from her  diverticular disease.  The patient had severe 4/4 diverticular disease  particularly severe in the left colon.  It took approximately 20 minutes  in different positions to get through the sigmoid and left colon.  She  had pandiverticulosis with diverticuli even in the cecum.  We reached  the cecum, identified the appendiceal orifice and entered the ileocecal  valve for the short distance of approximately 2 cm and could not advance  further.  The patient had a massive lipoma approximately 2-2.5 cm  probably on a small stalk above the ileocecal valve.  This had a  positive pillow sign and I photographed it and took some biopsies to be  certain, there was not anything else.  It was not ulcerated.  The scope  was withdrawn and no polyps or other lesions were seen.  Again, severe  diverticular disease throughout the entire  colon.  Internal hemorrhoids  seen into the rectum.  The scope was withdrawn.  The patient tolerated  the procedure well.   ASSESSMENT:  1. Severe diverticular disease.  2. Ascending colon polyp approximately 2-2.5 cm, very likely a lipoma      has been there for some time.  3. Heme-positive stools.   PLAN:  We will check path, see her back in the office in 2-3 months and  will give a diverticulosis handout try to keep her stools soft.           ______________________________  Llana Aliment. Malon Kindle., M.D.     Waldron Session  D:  07/30/2008  T:  07/31/2008  Job:  [id number]   cc:   Ricki Rodriguez, M.D.

## 2010-06-27 NOTE — Consult Note (Signed)
[name], [name] NO.:  [id number]   MEDICAL RECORD NO.:  [id number]          PATIENT TYPE:  INP   LOCATION:  4738                         FACILITY:  [facility]   PHYSICIAN:  Danise Edge, M.D.   DATE OF BIRTH:  11/12/1923   DATE OF CONSULTATION:  05/10/2008  DATE OF DISCHARGE:                                 CONSULTATION   We were asked to see [name] today in consultation for heme-positive  anemia by Dr. Orpah Cobb.   HISTORY OF PRESENT ILLNESS:  This is an 77 year old female with a  history of chronic anemia.  She also has a history of large cecal  lipoma.  She was out paying her bills and developed dizziness.  She went  to urgent care and found that she was orthostatic.  She came to [facility], was admitted by Dr. Algie Coffer who has requested that we see  her for rectal bleeding.  The patient reports some blood on her tissue  over the last few days, as well as some epigastric pain that started  this morning.  With regards to the bleeding, the patient has daily bowel  movement with no constipation, no pain.  She has a good appetite.  She  is on iron for chronic anemia.  She tells me that she changed the type  of iron that she takes, and her rectal bleeding started.  She then  switched back to her regional type of iron.  With regards to the  epigastric pain, she has known GERD, for which she takes Nexium.  The  epigastric pain started this morning.  She does have a large hiatal  hernia.  She denies any NSAID use.   Her last colonoscopy was in November 2009 by Dr. Madilyn Fireman who found the  cecal lipoma and pan diverticulosis.  Her last upper endoscopy was in  April 2008, by Dr. Matthias Hughs who found a large hiatal hernia.  Her primary  gastroenterologist is Dr. Carman Ching.   Her past medical history is significant for:  1. GI bleeding.  2. Intra-cecal lipoma.  3. Hypertension.  4. Hyperlipidemia.  5. Obesity.  6. Coronary artery disease.   SURGERIES:  1. Cholecystectomy in 2007.  2. Knee surgery in 1997.  3. Abdominal abscess surgery in 1969.   She has a drug allergy to LIPITOR.   CURRENT MEDICATIONS:  Nexium, Micardis, potassium, isosorbide  mononitrate, Tylenol, nitroglycerin sublingual, Percocet, amlodipine,  and ferrous sulfate.  She is not on an 81 mg aspirin.  She takes no  NSAIDs.   REVIEW OF SYSTEMS:  Negative for weight loss.  Negative for palpitations  or shortness of breath but is positive for dizziness.   SOCIAL HISTORY:  She is an ex-smoker.  She has a 60-pack year smoking  history.  She denies ever drinking alcohol.   FAMILY HISTORY:  Significant for a brother who had multiple abdominal  surgeries, possibly for ulcers.   PHYSICAL EXAMINATION:  GENERAL:  She is alert and oriented, pleasant to  speak with.  VITAL SIGNS:  Her temperature is 98, pulse 57, respirations  19, blood  pressure is 102/69.  HEART:  Regular rate and rhythm.  LUNGS:  Clear.  ABDOMEN:  Obese, nontender, nondistended with good bowel sounds.  RECTAL:  External rectal exam shows no frank blood.  She does have a  skin tag.  There are no obvious hernias.   LABORATORY DATA:  Hemoglobin of 9.6 with an MCV value of 91.8.  Looking  back through E-chart, it appears that her baseline hemoglobin is  somewhere between 11 and 11.4, hematocrit 28.2, white count 4.4,  platelets 265,000.  BUN 15, creatinine 1.03, glucose 102.  She has had 2  guaiac-positive stools in the hospital.   ASSESSMENT:  Dr. Danise Edge has seen and examined the patient,  collected history, and reviewed her chart.  His impression is that this  is an 77 year old female with heme-positive anemia, likely caused by  erosions from her large hiatal hernia.  We will proceed tomorrow with an  upper endoscopy and flexible sigmoidoscopy to evaluate her heme-positive  anemia.   Thanks very much for this consultation.      Stephani Police, PA     ______________________________  Danise Edge, M.D.    MLY/MEDQ  D:  05/10/2008  T:  05/11/2008  Job:  [id number]   cc:   Fayrene Fearing L. Malon Kindle., M.D.  Ricki Rodriguez, M.D.

## 2010-06-27 NOTE — Consult Note (Signed)
NAME:  MARCHE, HOTTENSTEIN                ACCOUNT NO.:  [id number]   MEDICAL RECORD NO.:  [id number]          PATIENT TYPE:  INP   LOCATION:  4743                         FACILITY:  [facility]   PHYSICIAN:  John C. Madilyn Fireman, M.D.    DATE OF BIRTH:  Aug 12, 1923   DATE OF CONSULTATION:  12/23/2007  DATE OF DISCHARGE:                                 CONSULTATION   We were asked to see [name] today in consultation for GI bleeding by  Dr. Orpah Cobb.   HISTORY OF PRESENT ILLNESS:  This is an 77 year old female with a  history of chronic iron deficiency anemia, large hiatal hernia, and  large cecal polyp who reports minimal lower abdominal pain and passing  of large dark red jelly-like bowel movements that started Sunday at  6:00 p.m., she has been having about twice a day since.  She admits to  taking two aspirin yesterday even though she has an allergy to aspirin  but states that she has not taken more than that.  She denies anorexia,  recent illness, vomiting, heartburn, and weight loss.  Her bowel  movements are usually brown and relatively easy to pass.  She has  chronic GERD that is controlled with Nexium.  Her gastroenterologist is  Dr. Carman Ching.   PAST MEDICAL HISTORY:  1. Significant for coronary artery disease.  2. Hypertension.  3. Hyperlipidemia.  4. Obesity.  5. Chronic back pain.  6. Chronic iron deficiency anemia.   She has had a cholecystectomy in 2007.  She had knee surgery in 1997 and  she had an abdominal abscess surgery in 1969.  Her last colonoscopy was  done in April 2008 by Dr. Bernette Redbird.  This was for GI bleed.  He  found severe sigmoid diverticulosis, also large cecal polyp that was 6-8  cm in length and 3 cm across.  Biopsies of this polyp revealed that it  was a polypoid mass, hyperplastic in nature.  Her last upper endoscopy  was the same day May 27, 2006, it was normal other than a large hiatal  hernia.   CURRENT MEDICATIONS:  Include Nexium,  Micardis, Centrum, Norvasc,  isosorbide mononitrate, iron, hydrocodone, and potassium.  [name]  reports that she took two aspirin yesterday because she does not like  the way the hydrocodone makes her feel.   She has allergies to with LIPITOR, LOPID, CELEBREX, ZETIA, and CRESTOR.   REVIEW OF SYSTEMS:  Negative other than HPI.   SOCIAL HISTORY:  Positive for 60-pack-year of tobacco history.  However,  she no longer smokes.  She denies using any alcohol.   Family history is significant for a brother who had abdominal surgeries.  She thinks this may have been due to ulcers, but is not sure.   PHYSICAL EXAM:  She is alert and oriented in no apparent distress.  Her  temperature is 98.5, pulse is 78, respirations 20, blood pressure is  124/69.  Her heart has a regular rate and rhythm with no murmurs, rubs  or gallops appreciated.  Her lungs are clear to auscultation.  Her  abdomen is soft, mildly tender in the left lower quadrant, it is not  distended.  She has normal bowel sounds.  The room does smell of bloody  stool.  Hemoglobin when she was admitted was 11.0, it is now 9.6.  Her  hematocrit 29.3, white count 4.3, platelets 282,000.  Her MCV value is  94.1.  Her BMET is within normal limits.  She has a BUN of 15,  creatinine 1.18.  PT is 13.3, INR 1.0, cholesterol 276.  She is guaiac  positive.   ASSESSMENT AND PLAN:  Dr. Dorena Cookey was seen and examined the patient,  collected a history and reviewed her chart.  This is an 77 year old  female with a gastrointestinal bleed and acute blood loss anemia.  We  are not entirely certain if the gastrointestinal bleed as upper or  lower, but suspect it may be due to her polypoid cecal mass.  Plan will  give the patient clear liquids and began gentle colon clean out.  We  will consider CT scan versus colonoscopy to further evaluate the  gastrointestinal bleed.  May consider a surgical consult inpatient if  the cecal mass does turn out to be  the culprit.  Thanks very much for  this consultation.      Stephani Police, PA    ______________________________  Everardo All Madilyn Fireman, M.D.    MLY/MEDQ  D:  12/23/2007  T:  12/23/2007  Job:  [id number]   cc:   Ricki Rodriguez, M.D.  James L. Malon Kindle., M.D.

## 2010-06-27 NOTE — Discharge Summary (Signed)
NAMEBRITTONY, [name]                ACCOUNT NO.:  [id number]   MEDICAL RECORD NO.:  [id number]          PATIENT TYPE:  OIB   LOCATION:  5504                         FACILITY:  [facility]   PHYSICIAN:  Ricki Rodriguez, M.D.  DATE OF BIRTH:  [birth year]   DATE OF ADMISSION:  10/03/2007  DATE OF DISCHARGE:  10/04/2007                               DISCHARGE SUMMARY   FINAL DIAGNOSES:  1. Multivessel native vessel coronary artery disease.  2. Exertional dyspnea.  3. Hypertensive heart disease.  4. Obesity.  5. Hyperlipidemia.  6. Hypertension  7. Chronic back pain.  8. Chronic iron-deficiency anemia.   DISCHARGE MEDICATIONS:  1. Amlodipine 5 mg 1 daily.  2. Imdur 30 mg 1 daily.  3. Nexium 40 mg 1 daily.  4. Micardis/HCT 80/25 mg half daily.  5. KCl (potassium) 10 mEq 1 daily.  6. Ferrous sulfate 1 daily in a.m.  7. Multivitamin 1 daily in p.m.  8. Calcium plus vitamin D 2 daily in p.m.  9. Patanol eye drops as directed.  10.Flexeril 10 mg half twice daily as needed.  11.Vicodin 5/500 mg 1 twice daily as needed.   CONDITION ON DISCHARGE:  Stable.   DISCHARGE DIET:  Low-sodium, heart-healthy diet.   DISCHARGE ACTIVITY:  The patient to increase activity slowly.   WOUND CARE:  The patient to notify right groin pain, swelling, or  discharge.   SPECIAL INSTRUCTION:  The patient to stop any activity that causes chest  pain, shortness of breath, dizziness, sweating, or excessive weakness.   FOLLOWUP:  By Dr. Orpah Cobb in 2 weeks.  The patient to call [phone]  for appointment.   HISTORY:  This 77 year old black female presented with exertional  dyspnea.  She had a known history of coronary artery disease.  She had  hypertension, hyperlipidemia, and abnormal EKG.   PHYSICAL EXAMINATION:  VITAL SIGNS:  Pulse 70, respirations 14, blood  pressure 120/70, height 5 feet 4 inches, weight 213 pounds, and BMI of  33.  GENERAL:  The patient is well-built, well-nourished black female in  no  acute distress.  HEENT:  The patient is normocephalic and atraumatic.  Has brown eyes.  Positive arcus senilis.  Left lens implant.  Full upper and lower  dentures.  NECK:  No JVD.  LUNGS:  Clear bilaterally.  HEART:  Normal S1 and S2 with grade 2/6 systolic murmur.  ABDOMEN:  Soft and mildly distended.  EXTREMITIES:  No edema.  CNS:  Bilateral equal grips.  The patient moves all 4 extremities.  She  is right-handed.  Cranial nerves grossly intact.   LABORATORY DATA:  Hemoglobin low at 10.8 and hematocrit 33.1, unchanged  from 6 months.  Normal WBC count and platelet count.  Electrolytes:  BUN  19, creatinine borderline at 1.4, sugar borderline at 101, cholesterol  elevated at 292, LDL cholesterol of 205, HDL cholesterol and  triglycerides were normal.   EKG revealed a normal sinus rhythm with a possible septal infarct.   Cardiac catheterization showed mild multivessel native vessel coronary  artery disease.   HOSPITAL COURSE:  The patient was  taken to the cardiac catheterization  laboratory.  Cardiac cath was performed which showed noncritical  coronary artery disease.  Because of the patient's advanced age and no  assistance at home, she was placed in observation for 24 hours and with  no additional complaints.  The patient was discharged in satisfactory  condition with follow up by me in 2 weeks.      Ricki Rodriguez, M.D.  Electronically Signed     ASK/MEDQ  D:  10/04/2007  T:  10/04/2007  Job:  [id number]

## 2010-06-27 NOTE — Discharge Summary (Signed)
[name], [name]                ACCOUNT NO.:  [id number]   MEDICAL RECORD NO.:  [id number]          PATIENT TYPE:  INP   LOCATION:  4738                         FACILITY:  [facility]   PHYSICIAN:  Ricki Rodriguez, M.D.  DATE OF BIRTH:  1923/03/19   DATE OF ADMISSION:  05/06/2008  DATE OF DISCHARGE:  05/12/2008                               DISCHARGE SUMMARY   FINAL DIAGNOSES:  1. Dehydration.  2. Chronic iron deficiency anemia.  3. Chronic lower gastrointestinal bleed.  4. Hypertension.  5. Obesity.   DISCHARGE MEDICATIONS:  1. Ferrous sulfate 325 mg one daily.  2. Multivitamin one daily.  3. Meclizine 12.5 mg one twice daily.  4. Metoprolol 25 mg one twice daily.  5. Protonix 40 mg one daily.  6. Nitroglycerin 0.4 mg tablet one sublingual every 5 minutes x 3 as      needed for chest pain.  7. Tylenol 500 mg 4 times daily as needed for pain.  8. Colace 100 mg one twice daily as needed for constipation.  9. Micardis/hydrochlorothiazide 80/25 mg. half daily.  10.KCl 10 meq. one daily.  11.Albuterol meter dose inhaler 2 puffs four times daily as needed.  12.Patanol 0.1% eye drops 1 drop each eye twice daily as needed.  13.Ciprodex otic solution 1 drop each ear 3 times daily as needed.   DISCHARGE DIET:  Low-sodium, heart-healthy diet.   DISCHARGE ACTIVITY:  The patient to increase activity slowly.   SPECIAL INSTRUCTION:  The patient to stop any activity that causes chest  pain, shortness of breath, dizziness, sweating or excessive weakness.   FOLLOWUP:  By Dr. Orpah Cobb in 1 week, the patient to call [phone]  for appointment and by Dr. Carman Ching in 1 month.   HISTORY:  This 77 year old black female presented with weakness and  dizziness.  The patient had some orthostatic changes at the [facility] the [facility].   She has a past medical history of:  1. Hypertension.  2. Iron deficiency anemia.  3. Chronic GI bleed.   She had past surgical history  of cholecystectomy in 2007, knee surgery  in 1997, abdominal abscess surgery in 1969.  She had a colonoscopy done  in April 2008 and November 2009.   PHYSICAL EXAMINATION:  VITAL SIGNS:  Temperature 98.1, pulse 87,  respirations 16, blood pressure 144/80, and oxygen saturation 97% on  room air.  GENERAL:  The patient is well-built, well-nourished black female in no  acute distress.  HEENT:  The patient is normocephalic and atraumatic.  Pupils equally  reacting to light.  Extraocular movement intact.  Sclerae clear.  Conjunctivae pink.  Eyes brown.  NECK:  Supple.  No JVD.  LUNGS:  Clear bilaterally.  HEART:  Normal S1 and S2.  ABDOMEN:  Soft and nontender.  EXTREMITIES:  No edema, cyanosis, or clubbing.  NEUROLOGIC:  Cranial nerves grossly intact.  The patient is alert and  oriented x 3 and she moves all 4 extremities.  She is right-handed.   LABORATORY DATA:  Hemoglobin 11.8, hematocrit 35.5.  Normal WBC count  and  platelet count.  Normal electrolytes, BUN, and creatinine.  Normal  liver enzymes.  Thyroid stimulating hormone was 1.14.  CK-MB and  troponin I normal.  Post IV fluids, hemoglobin came down to 9.8,  subsequent hemoglobin was 9.   EGD showed normal esophagus, large hiatal hernia of the stomach,  duodenum normal and flex sigmoidoscopy examination of the sigmoid colon  revealed internal hemorrhoids, otherwise normal.   HOSPITAL COURSE:  The patient was admitted to telemetry unit.  She  received IV fluids for her dehydration.  Her condition gradually  improved, but her hemoglobin dropped to 9.8 from 11.8, hence GI consult  was obtained.  For her positional vertigo, she got Antivert at 12.5 mg  twice daily with significant improvement.  Her GI workup was essentially  unremarkable.  Her hemoglobin remained around 9 mg/dL.  Her activity was  gradually increased, and she was discharged home in satisfactory  condition with follow up by GI doctor in 1 month and by me in 1  week.      Ricki Rodriguez, M.D.  Electronically Signed     ASK/MEDQ  D:  05/12/2008  T:  05/13/2008  Job:  [id number]

## 2010-06-27 NOTE — Op Note (Signed)
NAME:  [name], [name]                ACCOUNT NO.:  [id number]   MEDICAL RECORD NO.:  [id number]          PATIENT TYPE:  INP   LOCATION:  4743                         FACILITY:  [facility]   PHYSICIAN:  John C. Madilyn Fireman, M.D.    DATE OF BIRTH:  [birth year]   DATE OF PROCEDURE:  12/24/2007  DATE OF DISCHARGE:                               OPERATIVE REPORT   INDICATION FOR PROCEDURE:  GI bleeding in a patient with history of  vaguely characterized cecal polyp or mass on colonoscopy 2 years ago  with biopsies inconclusive for etiology felt possibly to represent a  lipoma at that time.   PROCEDURE:  The patient was placed in the left lateral decubitus  position and placed on the pulse monitor with continuous low-flow oxygen  delivered by nasal cannula.  She was sedated with 60 mcg IV fentanyl and  6 mg IV Versed.  The Olympus video colonoscope was inserted into the  rectum and advanced to the cecum, confirmed by transillumination of  McBurney's point and visualization of the ileocecal valve and  appendiceal orifice.  The prep was somewhat suboptimal but overall  allowed for adequate visualization of the colon.  Within the cecum,  there was a broad stalk terminating in an oblong ovoid shaped protruding  lesion with no clear delineation between the stalk and the tip which  appeared to be covered by normal mucosa.  I did not see any definite  submucosal yellow fat to directly suggest a lipoma but it was soft and  pillowy.  I did take several biopsies of the tip.  It was also felt this  could represent some other submucosal lesion such as a lipoma.  There  was no ulceration or any stigma of hemorrhage associated with it.  The  remainder of the cecum, ascending, transverse, descending and sigmoid  colon was mainly remarkable for diverticulosis throughout especially in  the sigmoid and descending colon but not limited to the segment.  There  was some possible narrowing with some resistance to passage of  the scope  through the sigmoid colon but there was no definite appreciation of any  neoplasm or inflammation as had been possibly suggested on CT scan.  The  rectum appeared normal and retroflexed view of the anus revealed no  obvious internal hemorrhoids.  The scope was then withdrawn and the  patient returned to the recovery room in stable condition.  She  tolerated the procedure well, and there were no immediate complications.   IMPRESSION:  1. Atypical cecal polyp versus lipoma, biopsied.  2. Pan diverticulosis.  3. No evidence of active bleeding.   PLAN:  We will await biopsy results and observe for any signs of further  blood loss.           ______________________________  Everardo All Madilyn Fireman, M.D.     JCH/MEDQ  D:  12/24/2007  T:  12/24/2007  Job:  [id number]   cc:   Ricki Rodriguez, M.D.

## 2010-06-27 NOTE — Op Note (Signed)
NAMEGRETEL, CANTU NO.:  [id number]   MEDICAL RECORD NO.:  [id number]          PATIENT TYPE:  INP   LOCATION:  4738                         FACILITY:  [facility]   PHYSICIAN:  Danise Edge, M.D.   DATE OF BIRTH:  1923/08/12   DATE OF PROCEDURE:  05/11/2008  DATE OF DISCHARGE:                               OPERATIVE REPORT   REFERRING PHYSICIAN:  Fayrene Fearing L. Randa Evens, MD   PROCEDURE INDICATIONS:  [name] is an 77 year old female born  on 13-Jul-1923.  Ms. Tafolla has chronic anemia and chronic  gastrointestinal bleeding.  In April 2008, her  esophagogastroduodenoscopy revealed a large hiatal hernia.  Her  colonoscopy revealed a large lipoma in the cecum and colonic  diverticulosis.   In November 2009, her colonoscopy revealed a large lipoma in the cecum  and universal colonic diverticulosis.   The patient is experiencing some mild epigastric discomfort and spots a  small amount of blood on the toilet tissue with bowel movements.  For  evaluation, she is scheduled for an esophagogastroduodenoscopy and  flexible proctosigmoidoscopy.   MEDICATIONS:  1. Nexium.  2. Micardis.  3. Potassium chloride.  4. Isosorbide.  5. Tylenol.  6. Nitroglycerin.  7. Amlodipine.  8. Iron sulfate.  9. Percocet.   PAST MEDICAL HISTORY:  1. Chronic gastrointestinal bleeding.  2. Large lipoma in the cecum of her colon.  3. Hypertension.  4. Obesity.  5. Coronary artery disease.  6. Cholecystectomy.  7. Knee surgery.  8. Abdominal abscess surgery performed in 1969.   ENDOSCOPIST:  Danise Edge, MD   PREMEDICATION:  Fentanyl 25 mcg and Versed 5 mg.   PROCEDURES:  Diagnostic esophagogastroduodenoscopy:  After obtaining  informed consent, Ms. Matson was placed in the left lateral decubitus  position.  I administered intravenous fentanyl and intravenous Versed to  achieve conscious sedation for the procedure.  The patient's blood  pressure, oxygen saturation, and  cardiac rhythm were monitored  throughout the procedure and documented in the medical record.   The Pentax gastroscope was passed through the posterior hypopharynx into  the proximal esophagus without difficulty.  The vocal cords were not  visualized.   Esophagoscopy:  The proximal mid and lower segments of the esophageal  mucosa appeared normal.  The squamocolumnar junction and esophagogastric  junction are noted at approximately 32 cm from the incisor teeth.  There  is no endoscopic evidence for the presence of erosive esophagitis,  Barrett esophagus, or esophageal varices.   Gastroscopy:  The patient has a large hiatal hernia.  Mucosa within the  hernia is somewhat pale compared to the normal mucosa and somewhat  friable.  There are prominent flat bluish-appearing veins in the hiatal  hernia.  The mucosa was biopsied.  I do not detect Cameron erosions at  the diaphragmatic hiatus.  The gastric body, antrum, and pylorus  appeared normal.   Duodenoscopy:  The duodenal bulb and descending duodenum appeared  normal.   ASSESSMENT:  1. Large hiatal hernia associated with somewhat friable pale overlying      mucosa and prominent flat veins.  Hiatal hernia mucosa was      biopsied.  2. Otherwise normal esophagogastroduodenoscopy.   PROCEDURE:  Diagnostic flexible proctosigmoidoscopy to 30 cm.  The  patient was given a Fleet enema prior to undergoing flexible  proctosigmoidoscopy.  Only the rectum and very distal sigmoid colon was  adequately cleansed for evaluation.  Anal inspection and digital rectal  exam were normal.  Endoscopic appearance of the rectum and distal  sigmoid colon was normal.  Retroflex view of the distal rectum revealed  moderate-sized nonbleeding internal hemorrhoids.   RECOMMENDATIONS:  The patient should be on a proton pump inhibitor.           ______________________________  Danise Edge, M.D.     MJ/MEDQ  D:  05/11/2008  T:  05/11/2008  Job:   [id number]   cc:   Fayrene Fearing L. Malon Kindle., M.D.

## 2010-06-30 NOTE — Op Note (Signed)
[name], RAFFEL NO.:  [id number]   MEDICAL RECORD NO.:  [id number]          PATIENT TYPE:  INP   LOCATION:  4705                         FACILITY:  [facility]   PHYSICIAN:  Bernette Redbird, M.D.   DATE OF BIRTH:  1923/03/15   DATE OF PROCEDURE:  05/27/2006  DATE OF DISCHARGE:                               OPERATIVE REPORT   PROCEDURE:  Colonoscopy with biopsy.   INDICATIONS:  An 77 year old female with anemia,  heme-positive stool,  and recent small-volume hematochezia.   FINDINGS:  Moderate internal hemorrhoids.  Severe diverticulosis,  especially in the sigmoid region.  Pedunculated mass near the cecum,  suggestive of a lipoma.  Diminutive polyp, not biopsied.   PROCEDURE:  The nature, purpose, risks of the procedure had been  reviewed with the patient, who provided written consent.  The Pentax  pediatric video colonoscope was advanced with enormous difficulty  through very strictured, somewhat angulated sigmoid region with  extensive diverticular change but by water irrigation, turning the  patient in the supine position, and persistence, I was able to gradually  advance through that area, which took approximately 30-40 minutes.  Thereafter, I could easily advanced to the cecum as identified by  visualization of the appendiceal orifice and I even entered the terminal  ileum for short distance and it appeared normal except for probably some  diverticular change there too.  Pullback was then performed.   Adjacent to the ileocecal valve was a large, long, smooth, pedunculated  lesion suggestive of a lipoma, probably measuring about 6-8 cm in  length, perhaps even longer, and about 3-4 cm across.  The surface of  this in some areas was slightly erythematous but not frankly ulcerated.  Overall it was very smooth and I feel it was most likely a lipoma, but  several biopsies were obtained to help confirm the absence of any  neoplastic change.  Nearby was a 2-mm  sessile diminutive polyp, which I  did not biopsy since it was not felt to be of concern in this 82-year-  old patient and I was concerned it might even be an inverted  diverticulum, which would be dangerous to biopsy.   There were diverticula throughout the colon, although not with the same  density or severe degree of muscular hypertrophy and stricturing as was  seen in the sigmoid region.   No other polyps or masses were seen, although realistically, the exam of  the colon was suboptimal due to fixation and residual stool from  diverticular extrusion.  Nonetheless, it is not felt that any large  polyps or masses would have been missed.   Retroflexion in the rectum showed moderate internal hemorrhoids, also  seen during pullout through the anal canal.   The patient tolerated this procedure quite well and there no apparent  complications.   IMPRESSION:  1. No definite source of heme positivity or anemia evident on this      exam.  2. Polypoid mass, probably a lipoma, in the cecal region.  3. Extensive diverticular change.  4. Possible diminutive proximal colonic polyp, which I elected  not to      biopsy.   PLAN:  Await pathology on the biopsies of the cecal mass.           ______________________________  Bernette Redbird, M.D.     RB/MEDQ  D:  05/27/2006  T:  05/28/2006  Job:  [id number]   cc:   Fayrene Fearing L. Malon Kindle., M.D.  Ricki Rodriguez, M.D.

## 2010-06-30 NOTE — H&P (Signed)
[name], [name] NO.:  [id number]   MEDICAL RECORD NO.:  [id number]          PATIENT TYPE:  INP   LOCATION:  6703                         FACILITY:  [facility]   PHYSICIAN:  Ricki Rodriguez, M.D.  DATE OF BIRTH:  05-17-1923   DATE OF ADMISSION:  10/25/2004  DATE OF DISCHARGE:                                HISTORY & PHYSICAL   HOSPITAL LOCATION:  6703, Bed 1.   CHIEF COMPLAINT:  Nausea, vomiting.   HISTORY OF PRESENT ILLNESS:  This 77 year old black female complained of  nausea, vomiting since Tuesday night and some weird feeling in the stomach  but denies pain, fever, coughing, cold or urinary symptoms.  Admitted to no  bowel movement for 2 days.   PAST MEDICAL HISTORY:  Negative for diabetes.  Positive for hypertension,  hyperlipidemia.  Recently quit smoking for the last 1 year, after smoking  for 50-60 years.  Negative history of alcohol intake and no family history  of premature coronary artery disease.  No history of drug use.  Positive  history of hiatal hernia and iron deficiency anemia.   PAST SURGICAL HISTORY:  1.  Spontaneous abortion with complications in 1950.  2.  Abdominal abscess surgery in 1969.  3.  Knee surgery in 1997.  4.  Cardiac catheterization in 2003.   CURRENT MEDICATIONS:  Include:  1.  Norvasc 5 mg one p.o. daily.  2.  Nexium 40 mg one daily.  3.  Hydrochlorothiazide 25/triamterene 37.5 1/2 tablet daily.  4.  Potassium 10 mEq twice daily.  5.  Celebrex 200 mg daily.  6.  Nitroglycerin 0.4 mg as needed.  7.  Flexeril 10 mg at bedtime has been discontinued.  8.  Aspirin 81 mg twice daily.  9.  Albuterol metered dose inhaler two puffs at bedtime.  10. Calcium and Vitamin D daily.   ALLERGY:  1.  CRESTOR.  2.  FLOVENT.  3.  GEMFIBROZIL.  4.  LIPITOR.  5.  VICODIN.  6.  ZETIA.   REVIEW OF SYSTEMS:  Negative for headache, tinnitus, dysphagia, runny nose.  Positive for nausea, abdominal pain.  Negative for chest pain,  dyspnea,  palpitation.  Negative for stroke, seizure disorder or psychiatric  admissions.   SOCIAL HISTORY:  Patient lives alone.  Has been a widow since 46.  She had  4 children who all live in [place].  She is a retired Advertising copywriter.   FAMILY HISTORY:  Mother died at age 87 from diabetes and status post below  the knee amputation.  Father not known.  Nephew had diabetes.  Sister with  diabetes.  Patient had 3 brothers, all are dead, and 2 died of cancer, 1 of  unknown cause.  Patient had six sisters, 3 of whom have died and 1 sister is  living with cancer and other 2 are living but have advanced age.   PHYSICAL EXAMINATION:  Temperature 98.7, pulse 88, respirations 23, blood  pressure 138/70, oxygen saturation 99% on room air.  Patient is alert,  oriented x3.  HEENT:  Patient is normocephalic, atraumatic with brown eyes.  Conjunctiva  pink, sclera nonicteric.  NECK:  Supple, no lymphadenopathy.  Mild left sided carotid bruit.  LUNGS:  Clear bilaterally.  HEART:  Normal S1, S2 with grade 2/6 systolic murmur.  ABDOMEN:  Soft, nontender but slightly distended.  EXTREMITIES:  Negative edema, cyanosis, clubbing.  NEUROLOGICALLY:  Cranial nerves grossly intact and patient moves all four  extremities.   LABORATORY DATA:  Normal WBC count with hemoglobin borderline at 11.7,  hematocrit 34.9, normal platelet count, normal sodium, potassium, BUN  borderline at 21, creatinine borderline at 1.3, glucose elevated at 189.  Liver enzymes normal.  Urinalysis negative.  X-ray abdomen:  Nonspecific  bowel gas pattern.   PLAN:  Is to give IV fluid, use Zofran for nausea, vomiting and if condition  persists consider GI evaluation, otherwise outpatient workup.      Ricki Rodriguez, M.D.  Electronically Signed     ASK/MEDQ  D:  10/25/2004  T:  10/26/2004  Job:  [id number]

## 2010-06-30 NOTE — Cardiovascular Report (Signed)
[place]. [facility]  Patient:    [name], [name] Visit Number: [id number] MRN: [id number]          Service Type: CAT Location: 6500 [id number] Attending Physician:  Rollene Rotunda Dictated by:   Daisey Must, M.D. [facility] Proc. Date: 05/21/01 Admit Date:  05/21/2001   CC:         Charolette Forward, M.D.  Rollene Rotunda, M.D. [facility]  Cardiac Catheterization Laboratory   Cardiac Catheterization  PROCEDURES PERFORMED: Intravascular ultrasound of the distal right coronary artery.  INDICATIONS: The patient is a 77 year old woman with chest pain. A Cardiolite scan showed inferior ischemia. Cardiac catheterization by Dr. Antoine Poche revealed questionable plaque in the distal right coronary artery with an area of abnormal flow. It was not clear whether this was due to plaque versus thrombus versus layering of contrast. Because this is a territory where ischemia was seen on the Cardiolite, at the request of Dr. Antoine Poche, we opted to proceed with intravascular ultrasound of the distal vessel.  DESCRIPTION OF PROCEDURE: We utilized a preexisting 6 French sheath in the right femoral artery. Heparin was administered to achieve an ACT of greater than 200 seconds. We used a 6 [place] JR4 guiding catheter and a BMW wire. Intravascular ultrasound was performed with an Atlantis catheter. Images were recorded on video tape while the catheter was slowly pulled back by the automatic pullback mechanism. At the conclusion of the procedure, the IVUS catheter and guide wire were removed. Final angiographic images revealed patency of the right coronary artery with no evidence of vessel damage.  COMPLICATIONS: None.  RESULTS: Intravascular ultrasound of the distal right coronary artery revealing the presence of mild concentric plaque in the distal vessel which was not obstructed. In the mid to distal vessel, there was mild to moderate degree of eccentric plaque with significant vessel  remodeling, but an adequate vessel lumen. There were no obstructive lesions identified.  PLAN: The patient will be managed medically. Dictated by:   Daisey Must, M.D. [facility] Attending Physician:  Rollene Rotunda DD:  05/21/01 TD:  05/22/01 Job: [id number]

## 2010-06-30 NOTE — Consult Note (Signed)
NAME:  RONNAE, KASER NO.:  [id number]   MEDICAL RECORD NO.:  [id number]          PATIENT TYPE:  INP   LOCATION:  1823                         FACILITY:  [facility]   PHYSICIAN:  Bernette Redbird, M.D.   DATE OF BIRTH:  November 02, 1923   DATE OF CONSULTATION:  05/26/2006  DATE OF DISCHARGE:                                 CONSULTATION   Dr. Sharyn Lull, covering for Dr. Algie Coffer, asked me see this 77 year old  African-American female because of rectal bleeding.  She is known to my  partner, Dr. Vilinda Boehringer, who apparently did a colonoscopy on her  perhaps 5-7 years ago, although the findings of that are not currently  known.  There is nothing recent on the electronic chart.  He also has  seen her for GERD, hiatal hernia and gallstones which were removed via  cholecystectomy a year ago.   The patient has no prior history of GI bleeding or bleeding ulcer.  With  that background, last night she had what felt like was a bowel movement  but nothing but liquid red blood came out.  The same thing happened  later yesterday evening and again several times today, at which time it  was mixed with stool to some degree.  There is been no associated  abdominal pain nor any weakness, dizziness, chest pain or shortness of  breath, and in the emergency room, her hemoglobin was 11.5.  Nonetheless, she had some further rectal bleeding in the emergency room,  and she expressed a desire to have this evaluated before going home.  Accordingly, arrangements were made for admission.   Note that the patient did take one BC powder yesterday before the  bleeding started, but there is no history of any ongoing exposure to  ulcerogenic medications.   PAST MEDICAL HISTORY:  Allergies or intolerance to multiple medications  including STATINS..   OUTPATIENT MEDICATIONS:  Micardis, Nexium, KCL, Imdur, Norvasc, Feosol,  Vicodin.   OPERATIONS:  1. Knee replacement.  2. Laparoscopic cholecystectomy.  3.  Abdominal wall abscess remotely.  4. Apparently also multiple gynecologic operations following a      stillbirth many years ago.   MEDICAL ILLNESSES:  1. Hypertension.  2. Dyslipidemia.  3. GERD.  4. Hiatal hernia.  5. Previous smoking.   HABITS:  Stopped smoking several years ago, nondrinker.   FAMILY HISTORY:  Per review of the electronic chart, it appears negative  for GI illnesses.   SOCIAL HISTORY:  Widowed, retired Advertising copywriter.   REVIEW OF SYSTEMS:  No ongoing GI tract symptoms.  GERD is well  controlled by Nexium.  She does not periodically experience rectal  bleeding ordinarily.   PHYSICAL EXAMINATION:  GENERAL:  A pleasant, healthy-appearing well-  nourished African-American female in no evident distress.  Neither  anxious nor depressed.  VITAL SIGNS:  Blood pressure 140/78, pulse 66, afebrile.  HEENT: Anicteric.  No frank pallor.  CHEST:  Few rhonchi.  HEART:  1/6 systolic ejection murmur, possibly.  Regular rhythm.  No  gallops heard.  ABDOMEN:  Normal bowel sounds.  No organomegaly, mass or tenderness.  RECTAL:  No obvious perianal disease.  The stool was liquid or very  soft, brown, without any visible blood, but does test hemoccult  positive.   IMPRESSION:  A suspect that this is rectal outlet bleeding or perhaps a  diverticular hemorrhage which has already started to resolve.  Note that  there is a discrepancy in reported hemoglobin levels.  The printout I  have on the emergency room on this patient for today shows a hemoglobin  of 11.5, whereas on the electronic chart, there is a hemoglobin of 12.6.  Both of these were CBCs, not hemoglobin and hematocrits.  The hemogram  is otherwise the same for things like white count and platelet count, so  I am at a loss to explain the variance in hemoglobin.  In any event, the  patient had a discharge hemoglobin of 10.4 last October following her  laparoscopic cholecystectomy, but her baseline hemoglobin was 14.3  just  prior to surgery.  Her MCV is normal at 90, and RDW this is minimally  elevated 14.4.   PLAN:  I feel the patient should have colonoscopy and probably should  have endoscopic evaluation as well given the heme positivity and her  prior history of GERD and hiatal hernia.  The nature, purpose, risks of  these tests were reviewed with the patient and her daughter, who is at  bedside, and they are agreeable to proceed.  We will initiate the prep  this evening and plan to do the procedures tomorrow afternoon as long as  the prep goes okay.  I do not see indication for urgent endoscopic  evaluation at this time.   I appreciate the opportunity of seeing this patient consultation with  you.           ______________________________  Bernette Redbird, M.D.     RB/MEDQ  D:  05/26/2006  T:  05/26/2006  Job:  [id number]   cc:   Ricki Rodriguez, M.D.  James L. Malon Kindle., M.D.

## 2010-06-30 NOTE — Discharge Summary (Signed)
[name], [name]                ACCOUNT NO.:  [id number]   MEDICAL RECORD NO.:  [id number]          PATIENT TYPE:  INP   LOCATION:  4733                         FACILITY:  [facility]   PHYSICIAN:  Ricki Rodriguez, M.D.  DATE OF BIRTH:  [birth year]   DATE OF ADMISSION:  12/03/2005  DATE OF DISCHARGE:  12/05/2005                                 DISCHARGE SUMMARY   PRINCIPAL DIAGNOSES:  1. Food poisoning.  2. Nausea and vomiting.  3. Dehydration.  4. Hypokalemia.  5. Hypertension.   DISCHARGE MEDICATIONS:  1. Nexium 40 mg 1 p.o. daily.  2. Norvasc 5 mg 1 p.o. daily.  3. Micardis HCT 80/1.5 mg 1 daily.  4. KCl 10 mEq 1 daily.  5. Imdur 30 mg 1 daily.   DISCHARGE ACTIVITY:  As tolerated.   DISCHARGE DIET:  Low-fat, low-salt diet.   CONDITION ON DISCHARGE:  Improved.   FOLLOWUP:  Dr. Orpah Cobb in 2 weeks.   HISTORY:  This 77 year old black female presented with nausea and vomiting  after eating frozen food imported from [place].  The patient had mild  epigastric discomfort and has a history of gastroduodenitis with a similar  complaint.   PHYSICAL EXAMINATION:  VITAL SIGNS:  Temperature 100.3, pulse 77,  respirations 22, blood pressure 115/61.  GENERAL:  Patient is 5 feet, 6 inches tall and weighs approximately 209  pounds.  HEENT:  Patient is normocephalic, atraumatic with brown eyes.  NECK:  No JVD.  No carotid bruit.  LUNGS:  Clear bilaterally.  HEART:  Normal S1, S2.  ABDOMEN:  Mild epigastric tenderness.  EXTREMITIES:  No edema, cyanosis, or clubbing.   LABORATORY DATA:  Hemoglobin 12.5, hematocrit 38.7, normal WBC count and  platelet count.  Normal electrolytes, BUN, creatinine, potassium borderline  at 3.4.  Creatinine 1.2.  Urinalysis unremarkable.  Subsequent potassium,  after replacement, was 4.9.   Chest x-ray of the abdomen:  No acute abnormality.   EKG.  Normal sinus rhythm with nonspecific T-wave changes.   HOSPITAL COURSE:  The patient was admitted to  the telemetry unit.  She was  given IV fluids, potassium replacement, her diet was gradually advanced as  tolerated and, on December 05, 2005, she was discharged home in satisfactory  condition with resuming her home medications, diet, and activity.      Ricki Rodriguez, M.D.  Electronically Signed     ASK/MEDQ  D:  12/05/2005  T:  12/06/2005  Job:  [id number]

## 2010-06-30 NOTE — Discharge Summary (Signed)
[place]. [facility]  Patient:    [name], [name] Visit Number: [id number] MRN: [id number]          Service Type: CAT Location: 6500 [id number] Attending Physician:  Rollene Rotunda Dictated by:   Lavella Hammock, P.A. Admit Date:  05/21/2001 Disc. Date: 05/22/01   CC:         Juluis Mire, M.D.   Discharge Summary  DATE OF BIRTH: 1924/01/02  PROCEDURES:  1. Cardiac catheterization.  2. Coronary arteriogram.  3. Left ventriculogram.  HISTORY OF PRESENT ILLNESS: [name] is a 77 year old female, with no known history of coronary artery disease, who has risk factors that include tobacco use, hypertension, and hyperlipidemia.  She had hospitalization for chest pain in mid March (2003) and ruled out for an MI at that time.  An outpatient Cardiolite scan was arranged and done on May 01, 2001.  It showed mild ischemia in the inferior wall at the mid ventricular level and at the apex, with an EF of 78%.  She was seen in the office on May 16, 2001 and scheduled for cardiac catheterization on May 21, 2001.  HOSPITAL COURSE: She came in for cardiac catheterization on May 21, 2001 and it showed a left main with some luminal irregularities and an LAD with diffuse luminal irregularities, with proximal and mid 25% stenosis.  The circumflex and the RCA both had diffuse luminal irregularities, with the possibility of a plaque in the distal RCA proximal to the PDA.  There was some question about this lesion so she had IVUS of the RCA.  It shoulder mild nonobstructive plaque in the mid to distal RCA.  The patient tolerated the procedure well, and the sheath was removed without difficulty.  However, she developed a hematoma at the catheterization site and complained of burning in her leg.  Pressure was applied for approximately 25 minutes with resolution of the hematoma.  There was some tenderness at the site but pulses were intact.  She rested comfortably  overnight and her leg the next day had some ecchymosis but was without hematoma or bruit.  The patient was ambulating without difficulty and considered stable for discharge on May 22, 2001.  DISCHARGE CONDITION: Stable.  DISCHARGE DIAGNOSES:  1. Chest pain.  No significant coronary artery disease by catheterization     this admission.  2. Preserved left ventricular function with an ejection fraction of 78% by     Cardiolite.  3. Current history of tobacco use.  4. Hypertension.  5. Hyperlipidemia.  6. History of hiatal hernia with gastroesophageal reflux disease symptoms.  7. History of pancreatitis.  8. Iron deficiency anemia, with negative colonoscopy.  9. Abdominal abscess in 1969. 10. Knee surgery in 1997.  DISCHARGE INSTRUCTIONS:  1. Her activity level is to include no driving, sexual activity, or strenuous     activity for two days.  2. She is to stick to a low-fat diet.  3. She is to call the office for bleeding, swelling, or drainage at the     catheterization site.  4. She has an appointment with the P.A. for Dr. Antoine Poche on June 05, 2001 at     9:15 a.m.  5. She is to follow up with Dr. Emeline Darling as scheduled.  DISCHARGE MEDICATIONS:  1. Celebrex 200 mg q.d.  2. Hydrochlorothiazide 25 mg q.d.  3. Norvasc 2.5 mg q.d.  4. Nexium 40 mg q.d.  5. Iron q.d.  6. Aspirin 81 mg q.d.  7.  Zocor 20 mg q.d.  8. Lopid 600 mg b.i.d. Dictated by:   Lavella Hammock, P.A. Attending Physician:  Rollene Rotunda DD:  05/22/01 TD:  05/22/01 Job: [id number]

## 2010-06-30 NOTE — Op Note (Signed)
NAMEMARIANGEL, RINGLEY NO.:  [id number]   MEDICAL RECORD NO.:  [id number]          PATIENT TYPE:  INP   LOCATION:  4705                         FACILITY:  [facility]   PHYSICIAN:  Bernette Redbird, M.D.   DATE OF BIRTH:  [birth year]   DATE OF PROCEDURE:  05/27/2006  DATE OF DISCHARGE:                               OPERATIVE REPORT   PROCEDURE:  Upper endoscopy.   INDICATIONS:  An 77 year old female with anemia and heme-positive stool.  Minimal aspirin exposure.   FINDINGS:  Normal except for 10 cm hiatal hernia.   PROCEDURE:  The nature, purpose, risks of the procedure had been  reviewed with the patient who provided written consent.  Sedation was  fentanyl 50 mcg and Versed 6 mg IV without arrhythmias or desaturation.  The Pentax video endoscope was passed under direct vision.  The vocal  cords were not well seen.  The esophagus was readily entered.  There was  some free reflux occurring in the distal esophagus.  However, there was  no reflux esophagitis nor any evidence of Barrett's esophagus, varices,  infection, neoplasia, or any Mallory Weiss tear.  There was a fairly  sharp Z-line and perhaps a minimal Schatzki's ring.  This was at 30 cm.  Below this was a 10 cm very capacious hiatal hernia.  The diaphragmatic  hiatus was very patulous.  I did not see any evidence of Cameron lesions  or erosions.  The stomach contained no significant residual, no blood or  coffee-ground material.  There was no antral gastritis.  No erosions,  ulcers, polyps or masses were seen anywhere in the stomach and the  pylorus, duodenal bulb and proximal second duodenum looked normal.   The scope was removed from the patient.  She tolerated the procedure  well.  No biopsies were obtained.  There were no apparent complications.   IMPRESSION:  1. Large hiatal hernia.  2. Heme-positive stool, without any evident source seen on current      examination (792.1).   PLAN:  Proceed with  colonoscopic evaluation today.           ______________________________  Bernette Redbird, M.D.     RB/MEDQ  D:  05/27/2006  T:  05/28/2006  Job:  [id number]   cc:   Ricki Rodriguez, M.D.  James L. Malon Kindle., M.D.

## 2010-06-30 NOTE — Cardiovascular Report (Signed)
[place]  Patient:    [name], [name] Visit Number: [id number] MRN: [id number]          Service Type: CAT Location: [facility] 2855 [id number] Attending Physician:  Rollene Rotunda Dictated by:   Rollene Rotunda, M.D. [facility] Proc. Date: 05/21/01 Admit Date:  05/21/2001   CC:         Juluis Mire, M.D.   Cardiac Catheterization  DATE OF BIRTH:  1923/06/11  PRIMARY CARE PHYSICIAN:  Juluis Mire, M.D.  PROCEDURE:  Left heart catheterization/coronary arteriography.  INDICATIONS:  Evaluate patient with chest pain and a Cardiolite suggesting mild inferior ischemia.  PROCEDURAL NOTE:  Left heart catheterization was performed via the right femoral artery.  The artery was cannulated using an anterior wall puncture.  A #6 French arterial sheath was inserted via the modified Seldinger technique. Preformed Judkins and a pigtail catheter were utilized.  The patient tolerated the procedure well.  HEMODYNAMICS: 1. LV 109/12. 2. AO 113/65.  CORONARIES: 1. The left main had luminal irregularities.  2. The LAD had diffuse luminal irregularities.  There were focal proximal and    mid 25% lesions.  3. The circumflex had diffuse luminal irregularities.  4. The right coronary artery had diffuse luminal irregularities.  There was    questionable obstructive plaque in the distal RCA proximal to the PDA.    This could simply represent layering of blood flow; however, it was    apparent on multiple images in different views with some haziness.  LEFT VENTRICULOGRAM:  The left ventriculogram was obtained in the RAO projection.  The EF was 65% with normal wall motion.  CONCLUSIONS:  Diffuse coronary plaques with mildly ectatic coronary arteries and slow flow.  Questionable obstructive right coronary artery lesion.  PLAN:  The patient will have intravascular ultrasound to evaluate the distal right coronary artery. Dictated by:   Rollene Rotunda, M.D. [facility] Attending  Physician:  Rollene Rotunda DD:  05/21/01 TD:  05/21/01 Job: [id number]

## 2010-06-30 NOTE — Op Note (Signed)
NAME:  [name], [name]                ACCOUNT NO.:  [id number]   MEDICAL RECORD NO.:  [id number]          PATIENT TYPE:  OIB   LOCATION:  1007                         [facility]ACILITY:  [facility]   PHYSICIAN:  Angelia Mould. Derrell Lolling, M.D.DATE OF BIRTH:  November 30, 1923   DATE OF PROCEDURE:  05/22/2005  DATE OF DISCHARGE:                                 OPERATIVE REPORT   PREOPERATIVE DIAGNOSIS:  Chronic cholecystitis with cholelithiasis.   POSTOPERATIVE DIAGNOSIS:  Chronic cholecystitis with cholelithiasis.   OPERATION PERFORMED:  Laparoscopic cholecystectomy with intraoperative  cholangiogram.   SURGEON:  Angelia Mould. Derrell Lolling, M.D.   FIRST ASSISTANT:  Wilmon Arms. Tsuei, M.D.   OPERATIVE INDICATIONS:  This is a 77 year old black female with coronary  artery disease, hypertension, gastroesophageal reflux disease and multiple  pelvic operations.  For the past two years, she has had intermittent  episodes of upper abdominal pain, occasionally has had nausea and vomiting.  She has an undocumented episode of being hospitalized for pancreatitis.  Because of continued symptoms, she was evaluated by Dr. Carman Ching and  ultrasound was obtained which shows multiple gallstones and a normal common  bile duct.  Her liver function tests are normal.  Because of ongoing  symptoms, she was counseled as an outpatient regarding elective  cholecystectomy.  She is operated upon electively.   OPERATIVE FINDINGS:  The gallbladder actually was thin-walled and did not  look severely inflamed.  The anatomy of the cystic duct, cystic artery and  common bile duct were conventional.  The cholangiogram was normal showing  normal intrahepatic and extrahepatic bile ducts, no filling defects, and no  obstruction with good flow of contrast into the duodenum.  She had fairly  extensive adhesions in the lower half the abdomen, but the right upper  quadrant was free of adhesions, allowing for laparoscopic procedure.  The  liver, stomach,  duodenum and omentum otherwise looked normal.   OPERATIVE TECHNIQUE:  Following induction of general endotracheal  anesthesia, the patient's abdomen was prepped and draped in a sterile  fashion.  Marcaine 0.5% with epinephrine was used as a local infiltration  anesthetic.  A vertically oriented incision was made at the upper rim of the  umbilicus.  The fascia was incised in the midline and the abdominal cavity  entered under direct vision.  I carefully examined with my finger and found  that there were no adhesions in the right upper quadrant, but there were  some adhesions to the left and below.  A 10 mm Hassan trocar was carefully  inserted and secured with a pursestring suture of 0 Vicryl.  Pneumoperitoneum was created.  Video camera was inserted with visualization  and findings as described above.  Around the umbilicus, there were only  omental adhesions, but in the lower abdomen and we could see small bowel  adhesions to the anterior abdominal wall.  A 10 mm trocar was placed in the  subxiphoid region and two 5 mm trocars placed in the right mid abdomen.  The  gallbladder fundus elevated, the infundibulum was dissected free and  retracted laterally.  We dissected out the  cystic duct and the cystic  artery.  We isolated the cystic artery, secured it with multiple metal clips  and divided it as it went onto the wall of the gallbladder.  The cystic duct  was then further dissected and we created a large window behind the  gallbladder with a good critical view of the cystic duct.  The cystic duct  was then secured with a metal clip close to the infundibulum.  A  cholangiogram catheter was inserted into the cystic duct.  Cholangiogram was  obtained using the C-arm.  This was a normal cholangiogram showing no  filling defects, no obstruction and good representation of the intrahepatic  and extrahepatic biliary anatomy.  We were able to see both the right and  left hepatic ducts as well as  the common hepatic duct and common bile duct.  Cholangiogram catheter was removed.  The cystic duct was secured with  multiple metal clips and divided.  Gallbladder was dissected from its bed  with electrocautery, placed in the specimen bag and removed.  We made one  tiny hole in the gallbladder spilling some bile and so we spent some time  irrigating that out until the irrigation fluid was crystal clear.  We then  inspected the operative field and there was no bleeding and no bile leak  whatsoever.  After evacuating all the irrigation fluid, we removed the  trocars under direct vision.  There was no bleeding from the trocar sites.  The pneumoperitoneum was released.  The fascia at the umbilicus was closed  with two sutures of 0 Vicryl, one as a pursestring and one as an interrupted  suture.  The wounds were irrigated with saline.  Skin incisions were closed  with subcuticular sutures of 4-0 Monocryl and Steri-Strips.  Clean bandages  were placed and the patient was taken to the recovery room in stable  condition.   ESTIMATED BLOOD LOSS:  About 10-15 mL.   COMPLICATIONS:  None   Sponge, needle and instrument counts were correct.      Angelia Mould. Derrell Lolling, M.D.  Electronically Signed     HMI/MEDQ  D:  05/22/2005  T:  05/22/2005  Job:  [id number]   cc:   Fayrene Fearing L. Malon Kindle., M.D.  Fax: [phone]   Ricki Rodriguez, M.D.  Fax: [phone]

## 2010-06-30 NOTE — Discharge Summary (Signed)
[name], CHIUSANO                ACCOUNT NO.:  [id number]   MEDICAL RECORD NO.:  [id number]          PATIENT TYPE:  OBV   LOCATION:  6703                         FACILITY:  [facility]   PHYSICIAN:  Ricki Rodriguez, M.D.  DATE OF BIRTH:  [birth year]   DATE OF ADMISSION:  10/25/2004  DATE OF DISCHARGE:  10/27/2004                                 DISCHARGE SUMMARY   PRINCIPAL DIAGNOSES:  1.  Gastroduodenitis.  2.  Dehydration.  3.  Chronic anemia.  4.  Hypertension.  5.  Arthritis.   DISCHARGE MEDICATIONS:  1.  Norvasc 5 mg daily.  2.  Triamterine/HCTZ 37.5/25 1/2 tablet daily.  3.  Albuterol metered dose inhaler two puffs at bedtime.  4.  K-Dur 10 mg one daily.  5.  Nitroglycerin 0.4 mg tablet one sublingual q.5 minutes x3 as needed for      chest pain and as directed.  6.  Multivitamin one daily.  7.  Calcium plus vitamin D 600 mg one daily.  8.  Ferrous sulfate 325 mg one on Monday, Wednesday and Friday only.  9.  Tylenol 650 mg four times daily.   SPECIAL INSTRUCTIONS:  The patient to stop aspirin and Celebrex until  hemoglobin is rechecked in 1-2 months.   CONDITION ON DISCHARGE:  Improved.   DISCHARGE DIET:  Low fat low salt diet.   ACTIVITY:  Increase activity slowly as tolerated.   HISTORY:  This 77 year old black female presented with acute onset of nausea  and vomiting along with black-colored vomitus and black stools.  The patient  has been on iron pills for some time, had similar episode one year ago and  she has undergone colonoscopy four years ago, but was told no abnormality  was found however, those records cannot be verified in the computer and she  had no fever, urinary symptoms, or chest pain.   HISTORY:  Temperature 98, pulse 67, blood pressure 100/57, oxygen saturation  95% on room air.  The patient is well-developed black female in no acute distress.  HEENT:  Grossly unremarkable.  LUNGS:  Clear to auscultation bilaterally.  HEART:  Normal S1, S2, and  regular rhythm.  ABDOMEN:  Soft and nontender, active bowel sounds, healed surgical scar in  lower midline.  EXTREMITIES:  No edema.   LABORATORY DATA:  Hemoglobin 9.9, hematocrit 29 from 39, platelet count  291,000, white count 5300, sodium 138, potassium 3.5, subsequent potassium  4.2, chloride normal, BUN 16, creatinine 1.5, subsequent creatinine 1.4,  glucose elevated at 122, iron level normal, saturation 51%,  esophagogastroduodenoscopy by Dr. Luther Parody showed mild gastroduodenitis  with a CLOtest pending.  She also has 8-9 cm inflamed hiatal hernia.   HOSPITAL COURSE:  The patient was admitted to the regular floor, she was  started on IV fluids, bowel rest was given, she had a GI consult with Dr.  Roosvelt Harps. She had EGD which showed a mild duodenitis and inflamed  hiatal hernia.  She was treated with IV Protonix and her aspirin and  Celebrex were held.  Her condition improved in 24-48 hours and her diet  was  advanced to low fat low salt diet as tolerated, and she was discharged home  in satisfactory condition with followup by me in 1-2 weeks, and by GI  doctors as arranged.      Ricki Rodriguez, M.D.  Electronically Signed     ASK/MEDQ  D:  10/27/2004  T:  10/28/2004  Job:  [id number]

## 2010-06-30 NOTE — Discharge Summary (Signed)
[name], [name]                ACCOUNT NO.:  [id number]   MEDICAL RECORD NO.:  [id number]          PATIENT TYPE:  INP   LOCATION:  4743                         FACILITY:  [facility]   PHYSICIAN:  Ricki Rodriguez, M.D.  DATE OF BIRTH:  1923/08/26   DATE OF ADMISSION:  12/22/2007  DATE OF DISCHARGE:  12/26/2007                               DISCHARGE SUMMARY   HOSPITAL LOCATION:  4743, bed 1.   FINAL DIAGNOSES:  1. Acute gastrointestinal hemorrhage, nonspecific.  2. Intra-cecal lipoma.  3. Post hemorrhagic anemia.  4. Diverticulosis.  5. Iron-deficiency anemia.  6. Hypertension.  7. Hyperlipidemia.  8. Obesity.  9. Nonspecific backache.   DISCHARGE MEDICATIONS:  1. Micardis/HCT 80/25 half daily.  2. Nexium 40 mg 1 daily.  3. Centrum Silver 1 daily.  4. Perfect Iron 1 daily.  5. Hydrocodone 5/500 mg 1 twice daily as needed.  6. Potassium 10 mEq 1 daily.  7. Patanol eye drops twice daily as needed.  8. Ciprodex Otic solution 1 drop each ear twice daily as needed.  9. Tylenol Arthritis twice daily as needed.  10.Isosorbide mononitrate 60 mg half a tablet daily after 1 week.  11.Amlodipine 5 mg half a tablet daily after 1 week.   Followup by Dr. Orpah Cobb in 2 weeks.  The patient will call [phone]  and by GI physician from [facility] in 1 month.   DISCHARGE DIET:  Low-sodium heart-healthy diet.   DISCHARGE ACTIVITY:  The patient is to increase activity slowly.   SPECIAL INSTRUCTIONS:  The patient is to stop any activity that causes  chest pain, shortness of breath, dizziness, sweating, or excessive  weakness.   HISTORY:  This 77 year old black female presented with bleeding from  rectum without abdominal pain, fever, or nausea.  The patient did admit  to taking aspirin for 1 day.   PHYSICAL EXAMINATION:  VITAL SIGNS:  Pulse 55, respirations 18, blood  pressure 118/70, height 5 feet 4 inches, weight 210 pounds.  HEENT:  The patient is normocephalic, atraumatic.  Eyes,  brown eyes,  wears reading glasses, and has upper and lower full dentures.  NECK:  No JVD.  No positive bruits.  Full range of motion.  LUNGS:  Clear bilaterally.  HEART:  Normal S1 and S2 with grade 2/6 systolic murmur.  ABDOMEN:  Soft.  Left lower quadrant tenderness.  BACK:  Mild lower back tenderness.  RECTAL:  Dark stool, positive for blood.  EXTREMITIES:  Left knee scar without edema, cyanosis, or clubbing.  CNS: Cranial nerves grossly intact.  Bilateral equal grips.  The patient  is right handed, and she is alert and oriented x3.   LABORATORY DATA:  Normal WBC count, platelet count, slightly low  hemoglobin of 11, hematocrit 33.4.  Electrolytes normal.  BUN and  creatinine normal.  Amylase and lipase normal.   Colonoscopy revealed cecal lipoma as before without any evidence of  hemorrhage.   EKG revealed sinus rhythm with sinus arrhythmia with occasional  premature ventricular complexes.   Chest x-ray revealed no acute disease with a positive hiatal hernia.   HOSPITAL  COURSE:  The patient was admitted to telemetry unit.  She  received IV fluids.  She had a GI consult.  She underwent colonoscopy  that showed stable cecal colonic benign mass.  There was no evidence of  active hemorrhage and diverticular bleed was suspected.  The patient's  condition remained stable, and she was discharged home on December 26, 2007, with follow up by me in 2 weeks.      Ricki Rodriguez, M.D.  Electronically Signed     ASK/MEDQ  D:  02/20/2008  T:  02/20/2008  Job:  [id number]

## 2010-06-30 NOTE — Consult Note (Signed)
[name], [name] NO.:  [id number]   MEDICAL RECORD NO.:  [id number]          PATIENT TYPE:  INP   LOCATION:  6703                         FACILITY:  [facility]   PHYSICIAN:  Shirley Friar, MDDATE OF BIRTH:  May 16, 1923   DATE OF CONSULTATION:  10/26/2004  DATE OF DISCHARGE:                                   CONSULTATION   REFERRING PHYSICIAN:  Dr. Ricki Rodriguez.   BRIEF INDICATION/REASON OF CONSULTATION:  Nausea and vomiting.   HISTORY OF PRESENT ILLNESS:  The patient is a 77 year old black female with  past medical history of gastroesophageal reflux disease, iron deficiency  anemia, osteoarthritis, hypertension, hypercholesterolemia, who presented on  October 25, 2004 with acute onset of nausea and vomiting.  She states that  she was in her usual state of health when she had the acute onset of  vomiting black-colored vomitus and this was in association with dizziness  and lightheadedness.  She states that the vomiting was at least a cupful,  each episode.  She denied any abdominal pain, melena, hematochezia, fever,  chills or night sweats.   She reports a similar episode a year ago, but states at that time she had  bright red blood, vomiting, but does not recall having an upper endoscopy at  that time.  She does report having a colonoscopy 4 years ago, but was not  told of any abnormalities based on that.  She takes Celebrex chronically  along with baby aspirin, but denies any other NSAIDS.  She is chronically on  Nexium for several years to help with her reflux which she has had for  approximately 6 years.  She denies any dysphagia, odynophagia or weight  loss.   In the emergency department, she had hemodynamically stable vital signs with  initial vitals of pulse of 91, systolic blood pressure of 143 with diastolic  of 78.  She was not hypoxic and had a temperature of 99.6.  She was noted to  have a BUN of 21 with creatinine of 1.3 on admission  with hematocrit of 39,  hemoglobin 13.3 and normal platelet count.  Her LFTs were normal and her  albumin is 3.9.   PAST MEDICAL HISTORY:  1.  Gastroesophageal reflux disease.  2.  Hypertension.  3.  Hypercholesterolemia.  4.  Osteoarthritis.  5.  Iron deficiency anemia.   PAST SURGICAL HISTORY:  1.  Status post abdominal abscess surgery in 1969.  2.  Status post knee surgery in 1997.  3.  Status post spontaneous abortion with complications in 1950.  4.  Status post cardiac catheterization in 2003.   MEDICATIONS ON ADMISSION:  1.  Norvasc 5 mg daily.  2.  Nexium 40 mg daily.  3.  Hydrochlorothiazide 12.5 mg daily.  4.  Celebrex 200 mg daily.  5.  Nitroglycerin p.r.n.  6.  Iron sulfate 325 mg daily.  7.  Flexeril 5 mg nightly (not taking).  8.  Aspirin 81 mg daily.  9.  Albuterol two puffs 4 times daily.  10. Calcium and vitamin D daily.  11. Potassium chloride 10 mEq  b.i.d. (not taking).   ALLERGIES:  FLOVENT, VICODIN, LIPITOR, CRESTOR, GEMFIBROZIL, ZETIA.   FAMILY HISTORY:  Family history positive for stomach cancer in her brother,  denies colorectal cancer.   SOCIAL HISTORY:  Former alcohol abuse, quit 1 year ago, former tobacco  abuse, quit 1 year ago, widowed, worked as a Advertising copywriter.   PHYSICAL EXAMINATION:  VITAL SIGNS:  Temperature 98.4, pulse 67, blood  pressure 100/57.  O2 SATS 95% on room air.  GENERAL:  Well-developed, in no acute distress, pleasant.  HEENT:  Oropharynx clear.  CHEST:  Clear to auscultation bilaterally.  CARDIOVASCULAR:  Regular rate and rhythm without murmurs.  ABDOMEN:  Soft, nontender and non-distended, active bowel sounds, healed  surgical scar in lower midline.  EXTREMITIES:  No edema.   LABORATORY DATA:  Hemoglobin 9.9, hematocrit 29, that is down from  hematocrit of 39, MCV 91, platelet count 291,000, white blood count 5.3.  Sodium 138, potassium 3.5, chloride 106, CO2 25, BUN 16, creatinine 1.5,  glucose 122, calcium of 8.6.  Iron  level 116, ferritin 78, TIBC 227, percent  saturation 51%.   ASSESSMENT:  This is a 77 year old black female who presents with acute  onset of nausea and vomiting with coffee-grounds emesis in the setting of  chronic nonsteroidal anti-inflammatory drug use and chronic gastroesophageal  reflux disease.  Differential diagnosis includes peptic ulcer disease versus  esophagitis versus hemorrhagic gastritis versus arteriovenous malformation  versus Mallory-Weiss tear versus malignancy.  I suspect the patient may have  gastritis versus peptic ulcer disease as a cause of her symptoms.  She  denies any retching prior to the vomiting, which makes Mallory-Weiss tear  less likely, although not impossible.  She does have risk factors for  malignancy, based on her alcohol and tobacco history as well as her  ethnicity, although I think this is less likely due to the fact that she is  not having dysphagia or weight loss.  I do feel that she is in need of an  upper endoscopy prior to discharge and will recommend that along with the  following:   PLAN:  1.  Will EGD in a.m. to further assess the cause of this upper GI bleeding.  2.  Continue IV PPI tonight and then change over to oral after her      procedure.  3.  Continue to hold NSAIDS.  4.  N.p.o. after midnight, except medicines.     Shirley Friar, MD  Electronically Signed    VCS/MEDQ  D:  10/26/2004  T:  10/27/2004  Job:  [id number]

## 2010-06-30 NOTE — H&P (Signed)
. [facility]  Patient:    [name], [name] Visit Number: [id number] MRN: [id number]          Service Type: MED Location: [phone] Attending Physician:  Doneta Public Dictated by:   Vear Clock, M.D. Admit Date:  04/26/2001   CC:         Rocco Pauls, M.D.   History and Physical  PRIMARY PHYSICIAN:  [facility], Dr. Rocco Pauls, M.D.  CHIEF COMPLAINT:  Chest pain.  HISTORY OF PRESENT ILLNESS:  This is a 77 year old African-American female who awoke from sleep this evening with left-sided chest pain that started approximately 11:30 p.m. and stopped several hours later and was described as tightness "hard to breath".  The patient has had problems with this in the past, several times last about a month ago.  The patient was not seen at that time.  Denies history of cardiac disease.  Denies diaphoresis.  Pain worsened but there was no radiation.  The patient did have shortness of breath which resolved with 02.  The pain has now resolved.  RISK FACTORS: 1. The patients risk factors include hypertension. 2. Hypercholesterolemia, with poor control. 3. Significant tobacco history. 4. Unsure of paternal family history. 5. Postmenopausal female and her age which gives her approximately 26%    coronary artery disease risk and her a high probability.  PAST MEDICAL HISTORY: 1. Hiatal hernia with gastroesophageal reflux disease. 2. Pancreatitis in 1997. 3. Hypercholesterolemia with poor control with cholesterol staying at    approximately 240. 4. Hypertension for approximately 10 years for greater than 10 years with    good control. 5. Iron deficiency anemia.  The patient did have a negative colonoscopy in    2002, and serial guaiacs that were within normal limits.  PAST SURGICAL HISTORY: 1. Spontaneous abortion with complications in 1950. 2. Some kind of abdominal abscess in 1969. 3. Knee surgery in  1997.  MEDICATIONS: 1. Zocor 20 mg p.o. q. day. 2. Celebrex 200 mg p.o. b.i.d. p.r.n. 3. Gemfibrozil 600 mg p.o. b.i.d. 4. Hydrochlorothiazide 25 mg p.o. q. day. 5. Norvasc 2.5 mg p.o. q. day. 6. Nexium 40 mg p.o. q. day. 7. Premarin 0.625 mg p.o. q. day. 8. FeSO4 p.o. ______ 325 mg.  ALLERGIES:  No known drug allergies.  REVIEW OF SYSTEMS:  Negative for headache, tinnitus, dysphagia, runny nose, nausea or vomiting, diarrhea, abdominal pain or blood stools.  The patient does note vision problems, cough, minimal exertional shortness of breath and arthritis.  SOCIAL HISTORY:  The patient lives alone and has been widow since 73.  She has 4 children who all live in [place].  Positive tobacco history 1 pack a day for 60 years.  The patient is trying to decrease this and is now at 1/3 pack a day. Denies alcohol or drug use. Retired Advertising copywriter.  FAMILY HISTORY:  Mother deceased at age 32 from diabetes, status post BKA. Paternal history unknown.  Nephew with diabetes, sister with diabetes, deceased.  PHYSICAL EXAMINATION:  VITAL SIGNS:  Afebrile, pulse 85, blood pressure 151/76, respirations 20, 99% on room air.  GENERAL:  Comfortable appearing, sleepy but conversant.  HEENT:  TMs clear bilaterally.  Dentures in place.  Oropharynx without erythema.  NECK:  Supple no lymphadenopathy.  Left carotid bruit.  Mild.  CARDIOVASCULAR:  Regular rate and rhythm, 2/6 systolic murmur.  LUNGS:  End expiratory wheezes with a few scattered crackles.  ABDOMEN:  Soft, nontender, nondistended.  Slightly obese.  NEUROLOGIC:  Cranial nerves grossly intact.  Strength, tone and range of motion within normal limits.  LABORATORY DATA:  CBC:  WBCs 4.8, hemoglobin 10.8, platelets 317.  Sodium 141, potassium 3.9, chloride 106, CO2 28, BUN 25, creatinine 1.2.  Glucose 133, calcium 8.9, CK 75, MB 0.7.  Troponin 0.01.  CHEST X-RAY:  Shows mild COPD.  EKG: Shows normal sinus rhythm with first  degree AV block.  ASSESSMENT: 1. CHEST PAIN:  A 77 year old African-American female with chest pain    recurrent, now resolved.  Will plan to admit for rule out myocardial    infarction, serials, cardiac enzymes, a.m. EKG, fasting lipid panel.    Observe on telemetry.  The patient is a high risk for coronary disease yet    her symptoms and objective data are reassuring.  The patient likely needs    a stress test but this can probably be set up as an outpatient if the    patient is ruled out. 2. RESPIRATORY:  The patient with likely new onset, mild COPD.  Will give    albuterol and Atrovent nebulizers at arrival on the floor and then p.r.n..    The patient may need nebulizers versus MDIs on a long term basis as well. 3. HYPERGLYCEMIA:  The patient is a African-American female, elderly, with a    family history positive for diabetes.  She may well have diabetes.  Will    monitor while she is an inpatient.  Check hemoglobin A1c and follow up as    an outpatient. 4. OTHER MEDICAL ISSUES: Stable. Dictated by:   Vear Clock, M.D. Attending Physician:  Doneta Public DD:  04/26/01 TD:  04/28/01 Job: [id number]

## 2010-06-30 NOTE — Discharge Summary (Signed)
NAMEKHRYSTYNA, [name]                ACCOUNT NO.:  [id number]   MEDICAL RECORD NO.:  [id number]          PATIENT TYPE:  INP   LOCATION:  4705                         FACILITY:  [facility]   PHYSICIAN:  Ricki Rodriguez, M.D.  DATE OF BIRTH:  Oct 25, 1923   DATE OF ADMISSION:  05/26/2006  DATE OF DISCHARGE:  05/28/2006                               DISCHARGE SUMMARY   Patient admitted by Dr. Sharyn Lull.  The patient discharged by Dr. Orpah Cobb.   PRIMARY CARE PHYSICIAN:  Dr. Algie Coffer.   PRINCIPAL DIAGNOSES:  1. Acute on-chronic gastrointestinal bleed.  2. Severe diverticulosis of the colon.  3. Hypertension.  4. Anemia of blood loss.   PRINCIPAL PROCEDURE:  Colonoscopy done by Dr. Matthias Hughs on May 27, 2006.   DISCHARGE MEDICATIONS:  1. Nexium 40 mg one twice daily.  2. Norvasc 5 mg one daily.  3. Imdur 30 mg one daily.  4. Ferrous sulfate 325 mg, or Feosol one daily.  5. Robitussin DM, two teaspoons 4 times daily as needed for cough.  6. Docusate sodium 100 mg one daily, as stool softener.   DISCHARGE DIET:  Low-sodium heart-healthy diet.   DISCHARGE ACTIVITY:  The patient to increase activity slowly as  tolerated.   PATIENT INSTRUCTIONS:  The patient to avoid any activity that may cause  chest pain, shortness of breath or dizziness or sweating.  Follow-up by Dr. Orpah Cobb in 2-4 weeks and by [facility] in 1 month,  as arranged.   CONDITION ON DISCHARGE:  Improved.   HISTORY:  This 77 year old black female presented with fresh bleeding  from rectum, without abdominal pain, nausea, vomiting.  The patient had  chronic history of anemia, along with hypertension.   PHYSICAL EXAMINATION:  GENERAL:  The patient was alert, oriented x3 and  in no acute distress.  VITAL SIGNS:  Blood pressure was 123 x 79, pulse was 89.  HEENT:  Conjunctivae pink.  NECK:  Supple, negative JVD, negative bruits.  LUNGS:  Clear to auscultation.  HEART:  Normal S1-S2 and regular rhythm.  Systolic  murmur at left  sternal border, no S3 or S4 gallop.  ABDOMEN:  Soft.  Bowel sounds present.  EXTREMITIES:  No edema, cyanosis, clubbing.   LABORATORY DATA:  Revealed a hemoglobin of 11.5, hematocrit of 34.6, WBC  count 4000, platelet count 366,000, creatinine borderline at 1.3,  electrolytes normal.  Sugar borderline at 107.  Liver enzymes normal,  INR and PTT normal, blood group O+.   X-ray of the abdomen:  Diffuse diverticulosis, otherwise unremarkable.  EKG revealed normal sinus rhythm.  Colonoscopy revealed severe  diverticulosis and colonic polyp.   HOSPITAL COURSE:  The patient was admitted to telemetry unit.  She had  GI consult.  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  The patient remained stable throughout her hospital stay.  She was discharged home in satisfactory condition on May 28, 2006 with  follow-up by me in 2-4 weeks and by GI doctor in one month as arranged.  Ricki Rodriguez, M.D.  Electronically Signed     ASK/MEDQ  D:  05/28/2006  T:  05/28/2006  Job:  [id number]

## 2010-06-30 NOTE — Discharge Summary (Signed)
[facility]  Patient:    [name], MURDY Visit Number: [id number] MRN: [id number]          Service Type: MED Location: [phone] Attending Physician:  Doneta Public Dictated by:   Ocie Doyne, M.D. Admit Date:  04/26/2001 Discharge Date: 04/27/2001   CC:         Theron Arista C. Eden Emms, M.D. [facility]  Dr. Emeline Darling, [facility]   Discharge Summary  DISCHARGE DIAGNOSES: 1. Chest pain, noncardiac. 2. Hypercholesterolemia. 3. Hypertension. 4. Iron deficiency anemia. 5. Chronic obstructive pulmonary disease.  DISCHARGE MEDICATIONS: 1. Zocor 20 mg p.o. q.d. 2. Celebrex 200 mg p.o. b.i.d. 3. Gemfibrozil 600 mg p.o. b.i.d. 4. Hydrochlorothiazide 25 mg p.o. q.d. 5. Norvasc 2.5 mg p.o. q.d. 6. Nexium 40 mg p.o. q.h.s. 7. Ferrous sulfate 325 mg p.o. t.i.d. 8. Aspirin 81 mg p.o. q.d. 9. Premarin taper 0.3 mg p.o. q.d. for three weeks, then discontinue.  HISTORY OF PRESENT ILLNESS:  A 77 year old African-American female awoke from sleep with left-sided chest pain lasting several hours.  She described a tightness in her chest accompanied by dyspnea.  She has had several episodes of chest pain over the last one month but has not been evaluated.  She has no history of cardiac disease, no diaphoresis.  The pain was not radiating.  Her dyspnea resolved with oxygen, and the pain has completely resolved by the time she was evaluated in the emergency department.  Her risk factors include age, postmenopausal status although she is on hormone replacement therapy, history of tobacco abuse, hypertension, hypercholesterolemia, and she was noted to have hyperglycemia on admission, concerning for diabetes.  She does have a strong family history.  ADMISSION PHYSICAL EXAMINATION:  VITAL SIGNS:  Temperature 98.1, blood pressure 151/76, heart rate 85, respiratory rate 20, oxygen saturation 99% on room air.  GENERAL:  She appeared comfortable.  CHEST:  Few  scattered crackles, end-expiratory wheezes, few rhonchi.  CARDIAC:  Regular rate and rhythm with a 2/6 systolic ejection murmur.  ABDOMEN:  Soft, nontender, nondistended.  ADMISSION LABORATORY DATA:  White blood cells 4.8, hemoglobin 10.8, platelets 317.  BUN 25, creatinine 1.2, glucose 133.  Chest x-ray showed mild COPD.  EKG showed normal sinus rhythm with a first degree AV block.  HOSPITAL COURSE:  Ms. Walthour was admitted to telemetry and followed with serial cardiac enzymes and EKGs.  Her EKG remained in normal sinus.  She did not have any ST-T segment changes or Q-waves.  Her cardiac enzymes were unremarkable throughout.  A fasting lipid panel showed total cholesterol of 199, triglycerides of 113, HDL of 84, and LDL of 92.  Her hemoglobin a1C was 6.2, which may suggest borderline glucose intolerance.  She was treated with albuterol and Atrovent nebulizers for some dyspnea and responded well to these.  At the time of discharge she was saturating well on room air with no dyspnea.  Further history revealed that she has had claudication-type symptoms concerning for peripheral vascular disease.  Due to her multiple risk factors, I asked [facility] to see her for risk stratification.  Because her hospital course was unremarkable, they felt that this could be done as an outpatient, and [facility] will contact her on Monday to schedule an outpatient adenosine Cardiolite.  Her medication regimen was continued throughout with the exception of Premarin.  Based on the results of the [facility] study and her personal risk factors, I feel that the risk of cardiac disease outweighs the preventive effects  of hormone replacement therapy and for osteoporosis.  She denies having had severe hot flushes in some time, and multiple other treatment modalities are now becoming available for the treatment of vasomotor symptoms from estrogen withdrawal.  I have asked her to change to a  lower dose of Premarin, and she will take two weeks of 0.3 mg, then discontinue this medication.  If she experiences vasomotor symptoms from estrogen withdrawal, she will contact her primary physician at [facility] for a slower taper.  Given the chest x-ray showing hyperinflation consistent with COPD, may also want to consider outpatient pulmonary function tests to determine the extent of her pulmonary disease.  FOLLOW-UP: 1. [facility] will contact her when the office opens tomorrow to    schedule an outpatient adenosine Cardiolite. 2. The patient will contact [facility] for an appointment with Dr. Emeline Darling.    Issues to follow up include tapering hormone replacement therapy and    following up the results of her adenosine Cardiolite at [facility]. Dictated by:   Ocie Doyne, M.D. Attending Physician:  Doneta Public DD:  04/27/01 TD:  04/28/01 Job: [id number]

## 2010-06-30 NOTE — H&P (Signed)
NAMEANNASTON, UPHAM                ACCOUNT NO.:  [id number]   MEDICAL RECORD NO.:  [id number]          PATIENT TYPE:  INP   LOCATION:  1823                         FACILITY:  [facility]   PHYSICIAN:  Ricki Rodriguez, M.D.  DATE OF BIRTH:  1923/04/26   DATE OF ADMISSION:  12/03/2005  DATE OF DISCHARGE:                                HISTORY & PHYSICAL   CHIEF COMPLAINT:  Nausea and vomiting.   HISTORY OF PRESENT ILLNESS:  This 77 year old African American female  complains of nausea and vomiting since last 24 hours with mild epigastric  area discomfort.  No fever, cough, cold or urinary symptoms.   PAST MEDICAL HISTORY:  Negative for diabetes.  Positive for hypertension,  hyperlipidemia.  Patient recently quit smoking for the last 2 years after  smoking for 50 to 60 years.  No history of alcohol intake and no family  history of premature coronary artery disease.  No history of drug use.  The  patient has a positive history of hiatal hernia and iron deficiency anemia.   PAST SURGICAL HISTORY:  1. Spontaneous abortion with complications in 1950.  2. Abdominal abscess surgery in 1969.  3. Knee surgery in 1997.  4. Cardiac catheterization in 2003.  5. Gallbladder surgery in October of 2007.   CURRENT MEDICATIONS:  1. Amlodipine 5 mg daily.  2. Imdur 30 mg daily.  3. Omeprazole 40 mg daily.  4. Pepcid 20 mg in the evening.  5. Micardis/HCT 80/12.5 mg half daily.  6. KCL 10 mEq daily.  7. Nitroglycerin 0.4 mg sublingual every 5 mints x3 as needed for chest      pain.  8. Ferrous sulfate 325 mg two daily.  9. Promethazine 25 mg one daily as needed for nausea.  10.Hyoscyamine 0.15 mg sublingual as needed for stomach pain and spasm.  11.Albuterol metered dose inhaler two puffs 4 times daily as needed for      asthma.  12.Calcium 250 mg plus vitamin D 125 IU two daily.  13.Multivitamin one daily.  14.Vicodin/APAP 5/500 mg half of one daily as needed.   ALLERGIES:  INCLUDE:  1.  FLOVENT CAUSING SORE MOUTH.  2. LIPITOR.  3. CRESTOR.  4. GEMFIBROZIL.  5. ZETIA.  6. ASPIRIN AND CELEBREX CAUSING GI BLEED.  7. XANAX GIVING BAD DREAMS.   SOCIAL HISTORY:  Patient lives alone, has been widowed since 48.  Patient  has 4 children who all live in [place].  She is retired Advertising copywriter.   FAMILY HISTORY:  Mother died at age 66 from diabetes and status post below  the knee amputation.  Father not known.  Nephew had diabetes.  Sister has  diabetes.  Patient had 3 brothers, all of whom are dead.  Two died of  cancer, one of unknown cause.  Patient had 6 sisters, three of whom have  died.  One sister is living with cancer and two others are living but have  advanced age.   REVIEW OF SYSTEMS:  Patient denies recent weight gain, weight loss.  No  headache, tinnitus, dysphagia or runny nose.  Positive for nausea,  abdominal  pain.  Negative for chest pain, dyspnea, palpitations, stroke, seizure or  psychiatric admissions.  Also negative for kidney stone and positive for  joint pains.   PHYSICAL EXAMINATION:  VITAL SIGNS:  Temperature 97.0.  Pulse 80.  Respirations 20.  Blood pressure 130/70.  Oxygen saturation 98% on room air.  GENERAL:  The patient is alert and oriented x3.  HEENT:  Patient is normocephalic, atraumatic with brown eyes.  Conjunctivae  is pink.  Sclerae nonicteric.  Tongue pink and dry.  NECK:  Supple.  No  lymphadenopathy.  Mild left-sided carotid bruit.  LUNGS:  Clear bilaterally.  HEART:  Normal S1 and S2 with a grade 2/6 systolic murmur.  ABDOMEN:  Soft, nontender, except in epigastric area.  EXTREMITIES:  Negative edema, cyanosis or clubbing.  NEUROLOGICAL:  Cranial nerves grossly intact.  The patient moves all 4  extremities.   LABORATORY DATA:  Normal WBC count, hemoglobin and hematocrit.  Normal  electrolytes.  BUN and creatinine borderline.  Glucose slightly high.  Liver  enzymes normal.  X-ray of abdomen, nonspecific bowel gas pattern.   Urinalysis pending.   IMPRESSION:  1. Nausea, vomiting, rule out abdominal obstruction.  2. Dehydration.  3. Hypertension.  4. Hyperlipidemia.  5. Gastroesophagitis.  6. Chronic obstructive lung disease.   PLAN:  To admit the patient to telemetry bed, give IV fluids.      Ricki Rodriguez, M.D.  Electronically Signed     ASK/MEDQ  D:  12/03/2005  T:  12/04/2005  Job:  [id number]

## 2010-11-06 LAB — COMPREHENSIVE METABOLIC PANEL
ALT: 12
AST: 14
Albumin: 3.7
Alkaline Phosphatase: 69
BUN: 16
CO2: 28
Calcium: 9.1
Chloride: 105
Creatinine, Ser: 1.18
GFR calc Af Amer: 53 — ABNORMAL LOW
GFR calc non Af Amer: 44 — ABNORMAL LOW
Glucose, Bld: 141 — ABNORMAL HIGH
Potassium: 3.5
Sodium: 139
Total Bilirubin: 0.4
Total Protein: 6.9

## 2010-11-06 LAB — LIPID PANEL
Cholesterol: 238 — ABNORMAL HIGH
HDL: 46
LDL Cholesterol: 176 — ABNORMAL HIGH
Total CHOL/HDL Ratio: 5.2
Triglycerides: 79
VLDL: 16

## 2010-11-06 LAB — IRON AND TIBC
Iron: 41 — ABNORMAL LOW
Saturation Ratios: 16 — ABNORMAL LOW
TIBC: 254
UIBC: 213

## 2010-11-06 LAB — I-STAT 8, (EC8 V) (CONVERTED LAB)
BUN: 19
Bicarbonate: 28 — ABNORMAL HIGH
Chloride: 107
Glucose, Bld: 105 — ABNORMAL HIGH
HCT: 38
Hemoglobin: 12.9
Operator id: 192461
Potassium: 4.2
Sodium: 141
TCO2: 30
pCO2, Ven: 59.3 — ABNORMAL HIGH
pH, Ven: 7.282

## 2010-11-06 LAB — POCT CARDIAC MARKERS
CKMB, poc: <1 — ABNORMAL LOW
Myoglobin, poc: 52.4
Operator id: 192461
Troponin i, poc: <0.05

## 2010-11-06 LAB — MAGNESIUM: Magnesium: 1.7

## 2010-11-06 LAB — CBC
HCT: 32.9 — ABNORMAL LOW
HCT: 34.8 — ABNORMAL LOW
Hemoglobin: 10.8 — ABNORMAL LOW
Hemoglobin: 11.5 — ABNORMAL LOW
MCHC: 32.7
MCHC: 33.1
MCV: 91.4
MCV: 91.7
Platelets: 282
Platelets: 299
RBC: 3.59 — ABNORMAL LOW
RBC: 3.81 — ABNORMAL LOW
RDW: 14.6
RDW: 14.7
WBC: 3.7 — ABNORMAL LOW
WBC: 4.5

## 2010-11-06 LAB — CARDIAC PANEL(CRET KIN+CKTOT+MB+TROPI)
CK, MB: 0.7
CK, MB: 0.8
Relative Index: INVALID
Relative Index: INVALID
Total CK: 54
Total CK: 55
Troponin I: 0.03
Troponin I: 0.04

## 2010-11-06 LAB — DIFFERENTIAL
Basophils Absolute: 0
Basophils Relative: 0
Eosinophils Absolute: 0.1
Eosinophils Relative: 3
Lymphocytes Relative: 29
Lymphs Abs: 1.3
Monocytes Absolute: 0.3
Monocytes Relative: 7
Neutro Abs: 2.8
Neutrophils Relative %: 61

## 2010-11-06 LAB — POCT I-STAT CREATININE
Creatinine, Ser: 1.4 — ABNORMAL HIGH
Operator id: 192461

## 2010-11-06 LAB — CK TOTAL AND CKMB (NOT AT ARMC)
CK, MB: 0.9
Relative Index: INVALID
Total CK: 63

## 2010-11-06 LAB — BASIC METABOLIC PANEL
BUN: 14
CO2: 27
Calcium: 8.7
Chloride: 103
Creatinine, Ser: 1.2
GFR calc Af Amer: 52 — ABNORMAL LOW
GFR calc non Af Amer: 43 — ABNORMAL LOW
Glucose, Bld: 150 — ABNORMAL HIGH
Potassium: 4.3
Sodium: 136

## 2010-11-06 LAB — FERRITIN: Ferritin: 66 (ref 10–291)

## 2010-11-06 LAB — FOLATE: Folate: 10

## 2010-11-06 LAB — RETICULOCYTES
RBC.: 3.74 — ABNORMAL LOW
Retic Count, Absolute: 26.2
Retic Ct Pct: 0.7

## 2010-11-06 LAB — HEMOGLOBIN A1C
Hgb A1c MFr Bld: 6.3 — ABNORMAL HIGH
Mean Plasma Glucose: 147

## 2010-11-06 LAB — TROPONIN I: Troponin I: 0.04

## 2010-11-06 LAB — PROTIME-INR
INR: 0.9
Prothrombin Time: 12.3

## 2010-11-06 LAB — APTT: aPTT: 28

## 2010-11-06 LAB — TSH: TSH: 0.98

## 2010-11-15 LAB — CBC
HCT: 26.3 — ABNORMAL LOW
HCT: 28.8 — ABNORMAL LOW
HCT: 29.3 — ABNORMAL LOW
HCT: 29.7 — ABNORMAL LOW
HCT: 30.3 — ABNORMAL LOW
HCT: 33.4 — ABNORMAL LOW
Hemoglobin: 10 — ABNORMAL LOW
Hemoglobin: 10 — ABNORMAL LOW
Hemoglobin: 11 — ABNORMAL LOW
Hemoglobin: 8.7 — ABNORMAL LOW
Hemoglobin: 9.6 — ABNORMAL LOW
Hemoglobin: 9.6 — ABNORMAL LOW
MCHC: 32.8
MCHC: 33
MCHC: 33
MCHC: 33.2
MCHC: 33.5
MCHC: 33.7
MCV: 93.8
MCV: 93.9
MCV: 94.1
MCV: 94.3
MCV: 95.1
MCV: 95.6
Platelets: 133 — ABNORMAL LOW
Platelets: 249
Platelets: 271
Platelets: 282
Platelets: 291
Platelets: 299
RBC: 2.75 — ABNORMAL LOW
RBC: 3.07 — ABNORMAL LOW
RBC: 3.08 — ABNORMAL LOW
RBC: 3.17 — ABNORMAL LOW
RBC: 3.21 — ABNORMAL LOW
RBC: 3.55 — ABNORMAL LOW
RDW: 14.2
RDW: 14.2
RDW: 14.2
RDW: 14.2
RDW: 14.4
RDW: 14.5
WBC: 3.7 — ABNORMAL LOW
WBC: 4.2
WBC: 4.3
WBC: 4.3
WBC: 4.5
WBC: 5.1

## 2010-11-15 LAB — BASIC METABOLIC PANEL
BUN: 10
BUN: 11
BUN: 15
BUN: 6
CO2: 25
CO2: 27
CO2: 27
CO2: 28
Calcium: 8.5
Calcium: 8.7
Calcium: 9
Calcium: 9.1
Chloride: 105
Chloride: 108
Chloride: 109
Chloride: 96
Creatinine, Ser: 0.94
Creatinine, Ser: 1
Creatinine, Ser: 1.12
Creatinine, Ser: 1.18
GFR calc Af Amer: 53 — ABNORMAL LOW
GFR calc Af Amer: 56 — ABNORMAL LOW
GFR calc Af Amer: >60
GFR calc Af Amer: >60
GFR calc non Af Amer: 44 — ABNORMAL LOW
GFR calc non Af Amer: 47 — ABNORMAL LOW
GFR calc non Af Amer: 53 — ABNORMAL LOW
GFR calc non Af Amer: 57 — ABNORMAL LOW
Glucose, Bld: 106 — ABNORMAL HIGH
Glucose, Bld: 107 — ABNORMAL HIGH
Glucose, Bld: 116 — ABNORMAL HIGH
Glucose, Bld: 95
Potassium: 3.9
Potassium: 4.2
Potassium: 4.3
Potassium: 4.7
Sodium: 127 — ABNORMAL LOW
Sodium: 139
Sodium: 140
Sodium: 140

## 2010-11-15 LAB — DIFFERENTIAL
Basophils Absolute: 0
Basophils Relative: 1
Eosinophils Absolute: 0.1
Eosinophils Relative: 3
Lymphocytes Relative: 29
Lymphs Abs: 1.5
Monocytes Absolute: 0.3
Monocytes Relative: 5
Neutro Abs: 3.2
Neutrophils Relative %: 63

## 2010-11-15 LAB — LIPID PANEL
Cholesterol: 276 — ABNORMAL HIGH
HDL: 52
LDL Cholesterol: 210 — ABNORMAL HIGH
Total CHOL/HDL Ratio: 5.3
Triglycerides: 69
VLDL: 14

## 2010-11-15 LAB — COMPREHENSIVE METABOLIC PANEL
ALT: 11
AST: 17
Albumin: 3.6
Alkaline Phosphatase: 63
BUN: 18
CO2: 27
Calcium: 9.6
Chloride: 106
Creatinine, Ser: 1.19
GFR calc Af Amer: 53 — ABNORMAL LOW
GFR calc non Af Amer: 43 — ABNORMAL LOW
Glucose, Bld: 88
Potassium: 4.8
Sodium: 138
Total Bilirubin: 0.4
Total Protein: 6.4

## 2010-11-15 LAB — PROTIME-INR
INR: 1
Prothrombin Time: 13.3

## 2010-11-15 LAB — OCCULT BLOOD X 1 CARD TO LAB, STOOL
Fecal Occult Bld: POSITIVE
Fecal Occult Bld: POSITIVE

## 2010-11-15 LAB — AMYLASE: Amylase: 154 — ABNORMAL HIGH

## 2010-11-15 LAB — LIPASE, BLOOD: Lipase: 36

## 2010-11-15 LAB — APTT: aPTT: 25

## 2011-05-29 ENCOUNTER — Emergency Department (HOSPITAL_COMMUNITY)
Admission: EM | Admit: 2011-05-29 | Discharge: 2011-05-29 | Disposition: A | Discharge status: Home / Self Care | Payer: Medicare Other | Attending: Emergency Medicine | Admitting: Emergency Medicine

## 2011-05-29 ENCOUNTER — Emergency Department (HOSPITAL_COMMUNITY): Payer: Medicare Other

## 2011-05-29 ENCOUNTER — Encounter (HOSPITAL_COMMUNITY): Payer: Self-pay | Admitting: Emergency Medicine

## 2011-05-29 DIAGNOSIS — R112 Nausea with vomiting, unspecified: Secondary | ICD-10-CM | POA: Insufficient documentation

## 2011-05-29 DIAGNOSIS — R1110 Vomiting, unspecified: Secondary | ICD-10-CM

## 2011-05-29 DIAGNOSIS — Z79899 Other long term (current) drug therapy: Secondary | ICD-10-CM | POA: Insufficient documentation

## 2011-05-29 DIAGNOSIS — R10819 Abdominal tenderness, unspecified site: Secondary | ICD-10-CM | POA: Insufficient documentation

## 2011-05-29 DIAGNOSIS — R109 Unspecified abdominal pain: Secondary | ICD-10-CM | POA: Insufficient documentation

## 2011-05-29 LAB — COMPREHENSIVE METABOLIC PANEL
ALT: 8 U/L (ref 0–35)
AST: 26 U/L (ref 0–37)
Albumin: 3.7 g/dL (ref 3.5–5.2)
Alkaline Phosphatase: 67 U/L (ref 39–117)
BUN: 20 mg/dL (ref 6–23)
CO2: 25 mEq/L (ref 19–32)
Calcium: 9.4 mg/dL (ref 8.4–10.5)
Chloride: 101 mEq/L (ref 96–112)
Creatinine, Ser: 1.27 mg/dL — ABNORMAL HIGH (ref 0.50–1.10)
GFR calc Af Amer: 43 mL/min — ABNORMAL LOW (ref >90)
GFR calc non Af Amer: 37 mL/min — ABNORMAL LOW (ref >90)
Glucose, Bld: 169 mg/dL — ABNORMAL HIGH (ref 70–99)
Potassium: 3.9 mEq/L (ref 3.5–5.1)
Sodium: 138 mEq/L (ref 135–145)
Total Bilirubin: 0.2 mg/dL — ABNORMAL LOW (ref 0.3–1.2)
Total Protein: 7.3 g/dL (ref 6.0–8.3)

## 2011-05-29 LAB — CBC
HCT: 34.8 % — ABNORMAL LOW (ref 36.0–46.0)
Hemoglobin: 11.6 g/dL — ABNORMAL LOW (ref 12.0–15.0)
MCH: 30.5 pg (ref 26.0–34.0)
MCHC: 33.3 g/dL (ref 30.0–36.0)
MCV: 91.6 fL (ref 78.0–100.0)
Platelets: 326 10*3/uL (ref 150–400)
RBC: 3.8 MIL/uL — ABNORMAL LOW (ref 3.87–5.11)
RDW: 13.9 % (ref 11.5–15.5)
WBC: 5.1 10*3/uL (ref 4.0–10.5)

## 2011-05-29 LAB — LIPASE, BLOOD: Lipase: 127 U/L — ABNORMAL HIGH (ref 11–59)

## 2011-05-29 MED ORDER — ONDANSETRON HCL 4 MG/2ML IJ SOLN
INTRAMUSCULAR | Status: AC
  Administered 2011-05-29: 4 mg
  Filled 2011-05-29: qty 2

## 2011-05-29 MED ORDER — MORPHINE SULFATE 4 MG/ML IJ SOLN
6.0000 mg | Freq: Once | INTRAMUSCULAR | Status: AC
  Administered 2011-05-29: 6 mg via INTRAVENOUS
  Filled 2011-05-29: qty 2

## 2011-05-29 MED ORDER — IOHEXOL 300 MG/ML  SOLN
100.0000 mL | Freq: Once | INTRAMUSCULAR | Status: AC | PRN
  Administered 2011-05-29: 100 mL via INTRAVENOUS

## 2011-05-29 MED ORDER — MORPHINE SULFATE 4 MG/ML IJ SOLN
4.0000 mg | Freq: Once | INTRAMUSCULAR | Status: AC
  Administered 2011-05-29: 4 mg via INTRAVENOUS
  Filled 2011-05-29: qty 1

## 2011-05-29 MED ORDER — SODIUM CHLORIDE 0.9 % IV BOLUS (SEPSIS): 1000.0000 mL | Freq: Once | INTRAVENOUS | Status: AC

## 2011-05-29 MED ORDER — ONDANSETRON HCL 4 MG/2ML IJ SOLN
4.0000 mg | Freq: Once | INTRAMUSCULAR | Status: AC
  Administered 2011-05-29: 4 mg via INTRAVENOUS
  Filled 2011-05-29: qty 2

## 2011-05-29 MED ORDER — ONDANSETRON HCL 4 MG PO TABS: 4.0000 mg | ORAL_TABLET | Freq: Four times a day (QID) | ORAL | Status: DC

## 2011-05-29 MED ORDER — ONDANSETRON 8 MG/NS 50 ML IVPB
8.0000 mg | Freq: Once | INTRAVENOUS | Status: DC
  Filled 2011-05-29: qty 8

## 2011-05-29 MED ORDER — OXYCODONE-ACETAMINOPHEN 5-325 MG PO TABS: 1.0000 | ORAL_TABLET | ORAL | Status: AC | PRN

## 2011-05-29 NOTE — ED Provider Notes (Signed)
History    78 year old female with vomiting. Acute onset around 2:00 this morning. Nonbloody. Multiple episodes since onset. Diffuse abdominal pain. Patient cannot remember what started first, the pain or vomiting. No fevers or chills. Patient went to bed in her usual state of health. No sick contacts. No diarrhea.  CSN: [id number]  Arrival date & time 05/29/11  0604   First MD Initiated Contact with Patient 05/29/11 0701      Chief Complaint  Patient presents with  . Emesis    (Consider location/radiation/quality/duration/timing/severity/associated sxs/prior treatment) HPI  History reviewed. No pertinent past medical history.  History reviewed. No pertinent past surgical history.  History reviewed. No pertinent family history.  History  Substance Use Topics  . Smoking status: Not on file  . Smokeless tobacco: Not on file  . Alcohol Use: Not on file    OB History    Grav Para Term Preterm Abortions TAB SAB Ect Mult Living                  Review of Systems   Review of symptoms negative unless otherwise noted in HPI.   Allergies  Review of patient's allergies indicates no known allergies.  Home Medications   Current Outpatient Rx  Name Route Sig Dispense Refill  . ACETAMINOPHEN 325 MG PO TABS Oral Take 650 mg by mouth every 6 (six) hours as needed.    Marland Kitchen VITAMIN D 1000 UNITS PO TABS Oral Take 2,000 Units by mouth daily.    Marland Kitchen CIPROFLOXACIN-DEXAMETHASONE 0.3-0.1 % OT SUSP Both Ears Place 4 drops into both ears 2 (two) times daily as needed.    Marland Kitchen ESOMEPRAZOLE MAGNESIUM 40 MG PO CPDR Oral Take 40 mg by mouth daily before breakfast.    . FERROUS SULFATE 325 (65 FE) MG PO TABS Oral Take 325 mg by mouth daily with breakfast.    . HYDROCHLOROTHIAZIDE 25 MG PO TABS Oral Take 12.5 mg by mouth daily.    Marland Kitchen HYDROCODONE-ACETAMINOPHEN 5-500 MG PO TABS Oral Take 1 tablet by mouth daily as needed.    Marland Kitchen LOSARTAN POTASSIUM 100 MG PO TABS Oral Take 100 mg by mouth daily.    Marland Kitchen  METOPROLOL TARTRATE 25 MG PO TABS Oral Take 25 mg by mouth 2 (two) times daily.    . ADULT MULTIVITAMIN W/MINERALS CH Oral Take 1 tablet by mouth daily.    Marland Kitchen NITROGLYCERIN 0.4 MG SL SUBL Sublingual Place 0.4 mg under the tongue every 5 (five) minutes as needed.    Marland Kitchen POLYETHYLENE GLYCOL 3350 PO PACK Oral Take 17 g by mouth daily.    Marland Kitchen POTASSIUM CHLORIDE ER 10 MEQ PO TBCR Oral Take 10 mEq by mouth daily.      BP 180/82  Resp 20  Physical Exam  Nursing note and vitals reviewed. Constitutional: She appears well-developed and well-nourished. No distress.       Sitting up in bed. Mildly uncomfortable appearing but not toxic.  HENT:  Head: Normocephalic and atraumatic.  Eyes: Conjunctivae are normal. Pupils are equal, round, and reactive to light. Right eye exhibits no discharge. Left eye exhibits no discharge.  Neck: Neck supple.  Cardiovascular: Normal rate, regular rhythm and normal heart sounds.  Exam reveals no gallop and no friction rub.   No murmur heard. Pulmonary/Chest: Effort normal and breath sounds normal. No respiratory distress.  Abdominal: Soft. She exhibits no distension and no mass. There is tenderness. There is no rebound and no guarding.  Musculoskeletal: She exhibits no edema and no  tenderness.  Neurological: She is alert.  Skin: Skin is warm and dry. She is not diaphoretic.  Psychiatric: She has a normal mood and affect. Her behavior is normal. Thought content normal.    ED Course  Procedures (including critical care time)  Labs Reviewed  LIPASE, BLOOD - Abnormal; Notable for the following:    Lipase 127 (*)    All other components within normal limits  COMPREHENSIVE METABOLIC PANEL - Abnormal; Notable for the following:    Glucose, Bld 169 (*)    Creatinine, Ser 1.27 (*)    Total Bilirubin 0.2 (*)    GFR calc non Af Amer 37 (*)    GFR calc Af Amer 43 (*)    All other components within normal limits  CBC - Abnormal; Notable for the following:    RBC 3.80 (*)      Hemoglobin 11.6 (*)    HCT 34.8 (*)    All other components within normal limits  URINALYSIS, ROUTINE W REFLEX MICROSCOPIC   No results found.  Ct Abdomen Pelvis W Contrast  05/29/2011  *RADIOLOGY REPORT*  Clinical Data: Abdominal pain, nausea and vomiting.  CT ABDOMEN AND PELVIS WITH CONTRAST  Technique:  Multidetector CT imaging of the abdomen and pelvis was performed following the standard protocol during bolus administration of intravenous contrast.  Contrast: OMNIPAQUE IOHEXOL 300 MG/ML  SOLN  Comparison: Multiple exams, including 05/02/2010 and 05/26/2006  Findings: Coronary artery atherosclerotic calcification noted with a moderate sized hiatal hernia.  There is linear subsegmental atelectasis in the right lower lobe medially.  There is nodularity in the posterior basal segment right lower lobe which is less striking on the multiplanar reconstructed images and likely postinflammatory, but with a nodular component measuring 0.4 x 0.7 cm on image 6 of series 4. This is stable from 01/05/2009.  Small cysts noted in the lateral segment left hepatic lobe, stable. There is mild focal fatty infiltration in the right hepatic lobe on image 27 of series 2.  Minimal intrahepatic biliary dilatation noted.  Prior cholecystectomy is present.  Borderline prominent dorsal pancreatic duct noted.  Several renal cysts are present.  Vascular calcification noted in the right renal hilum.  There is also likely a 2 mm right kidney lower pole nonobstructive calculus.  No definite ureteral calculus, hydroureter, or hydronephrosis.  Aortoiliac atherosclerotic calcification noted.  Intraluminal lipoma noted in the proximal ascending colon.  Diffuse colonic diverticulosis is present.  No pathologic retroperitoneal or porta hepatis adenopathy is identified.  No pathologic pelvic adenopathy is identified.  No free pelvic fluid noted.  Urinary bladder appears unremarkable.  There is severe degenerative arthropathy of the  left hip. Considerable lumbar spondylosis noted.  IMPRESSION:  1.  Cecal lipoma likely accounts for the finding on recent barium enema. 2.  Considerable lumbar spondylosis and degenerative disc disease.  3.  Severe degenerative arthropathy of the left hip. 4.  Hiatal hernia. 5.  Bilateral renal cysts.  Hepatic cyst noted. 6.  Increase in mild intrahepatic biliary dilatation and slight dilatation of the dorsal pancreatic duct, of uncertain etiology. 7.  Atherosclerosis. 8.  Diffuse colonic diverticulosis. 9.  Nonobstructive right kidney lower pole nephrolithiasis. 10.  Small pulmonary nodule in the posterior basal segment right lower lobe is stable from 01/05/2009 and accordingly likely benign.  Original Report Authenticated By: Dellia Cloud, M.D.   1. Vomiting       MDM  87yF with vomiting and diffuse crampy abdominal pain. Mild tenderness on exam. Doubt surgical  abdomen. CT unrevealing as to etiology.  Labs fairly unremarkable. Clinically hydrated. Return precautions discussed. Outpt fu otherwise.        Raeford Razor, MD 06/07/11 [phone]

## 2011-05-29 NOTE — ED Notes (Signed)
Per ems: Pt comes from home with c/o nausea and vomiting which began around 2am this morning. Pt is vomiting yellowish-green sputum. Pt describes stomach discomfort as cramping.

## 2011-05-29 NOTE — ED Notes (Signed)
Called [facility] for transport home.  

## 2011-05-29 NOTE — ED Notes (Signed)
EAV:[id number]> Expected date:<BR> Expected time:<BR> Means of arrival:<BR> Comments:<BR> Elderly/nausea and vomiting

## 2011-05-29 NOTE — Discharge Instructions (Signed)
Nausea and Vomiting   Nausea means you feel sick to your stomach. Throwing up (vomiting) is a reflex where stomach contents come out of your mouth.   HOME CARE   Take medicine as told by your doctor.   Do not force yourself to eat. However, you do need to drink fluids.   If you feel like eating, eat a normal diet as told by your doctor.   Eat rice, wheat, potatoes, bread, lean meats, yogurt, fruits, and vegetables.   Avoid high-fat foods.   Drink enough fluids to keep your pee (urine) clear or pale yellow.   Ask your doctor how to replace body fluid losses (rehydrate). Signs of body fluid loss (dehydration) include:   Feeling very thirsty.   Dry lips and mouth.   Feeling dizzy.   Dark pee.   Peeing less than normal.   Feeling confused.   Fast breathing or heart rate.   GET HELP RIGHT AWAY IF:   You have blood in your throw up.   You have black or bloody poop (stool).   You have a bad headache or stiff neck.   You feel confused.   You have bad belly (abdominal) pain.   You have chest pain or trouble breathing.   You do not pee at least once every 8 hours.   You have cold, clammy skin.   You keep throwing up after 24 to 48 hours.   You have a fever.   MAKE SURE YOU:   Understand these instructions.   Will watch your condition.   Will get help right away if you are not doing well or get worse.   Document Released: 07/18/2007 Document Revised: 01/18/2011 Document Reviewed: 06/30/2010   [employer] Patient Information 2012 [employer].

## 2011-05-29 NOTE — ED Notes (Signed)
Pt c/o NV since 2am this morning. Pt states she "felt fine" before going to bed.  Pt states she was awoken this morning with hip pain so she went to get a drink of water and then vomited a few minutes afterwards. Pt is currently spitting up but no vomiting witnessed during assessment. Pt is A&O x4.

## 2011-11-29 ENCOUNTER — Other Ambulatory Visit: Payer: Self-pay | Admitting: Gastroenterology

## 2011-11-29 DIAGNOSIS — R1032 Left lower quadrant pain: Secondary | ICD-10-CM

## 2011-12-03 ENCOUNTER — Ambulatory Visit
Admission: RE | Admit: 2011-12-03 | Discharge: 2011-12-03 | Disposition: A | Discharge status: Home / Self Care | Payer: Medicare Other | Source: Ambulatory Visit | Attending: Gastroenterology | Admitting: Gastroenterology

## 2011-12-03 DIAGNOSIS — R1032 Left lower quadrant pain: Secondary | ICD-10-CM

## 2011-12-03 MED ORDER — IOHEXOL 300 MG/ML  SOLN
125.0000 mL | Freq: Once | INTRAMUSCULAR | Status: AC | PRN
  Administered 2011-12-03: 125 mL via INTRAVENOUS

## 2012-02-23 ENCOUNTER — Inpatient Hospital Stay (HOSPITAL_COMMUNITY)
Admission: EM | Admit: 2012-02-23 | Discharge: 2012-02-25 | DRG: 379 | Disposition: A | Discharge status: Home / Self Care | Payer: Medicare Other | Attending: Internal Medicine | Admitting: Internal Medicine

## 2012-02-23 ENCOUNTER — Encounter (HOSPITAL_COMMUNITY): Payer: Self-pay | Admitting: Emergency Medicine

## 2012-02-23 ENCOUNTER — Emergency Department (HOSPITAL_COMMUNITY): Payer: Medicare Other

## 2012-02-23 DIAGNOSIS — K922 Gastrointestinal hemorrhage, unspecified: Secondary | ICD-10-CM | POA: Diagnosis present

## 2012-02-23 DIAGNOSIS — K625 Hemorrhage of anus and rectum: Secondary | ICD-10-CM

## 2012-02-23 DIAGNOSIS — R6889 Other general symptoms and signs: Secondary | ICD-10-CM

## 2012-02-23 DIAGNOSIS — K5731 Diverticulosis of large intestine without perforation or abscess with bleeding: Principal | ICD-10-CM | POA: Diagnosis present

## 2012-02-23 DIAGNOSIS — Z8719 Personal history of other diseases of the digestive system: Secondary | ICD-10-CM

## 2012-02-23 DIAGNOSIS — Z79899 Other long term (current) drug therapy: Secondary | ICD-10-CM

## 2012-02-23 DIAGNOSIS — K5730 Diverticulosis of large intestine without perforation or abscess without bleeding: Secondary | ICD-10-CM

## 2012-02-23 DIAGNOSIS — K5790 Diverticulosis of intestine, part unspecified, without perforation or abscess without bleeding: Secondary | ICD-10-CM | POA: Diagnosis present

## 2012-02-23 DIAGNOSIS — I10 Essential (primary) hypertension: Secondary | ICD-10-CM | POA: Diagnosis present

## 2012-02-23 HISTORY — DX: Essential (primary) hypertension: I10

## 2012-02-23 HISTORY — DX: Diverticulosis of intestine, part unspecified, without perforation or abscess without bleeding: K57.90

## 2012-02-23 HISTORY — DX: Personal history of other diseases of the digestive system: Z87.19

## 2012-02-23 HISTORY — DX: Diverticulitis of intestine, part unspecified, without perforation or abscess without bleeding: K57.92

## 2012-02-23 LAB — CBC WITH DIFFERENTIAL/PLATELET
Basophils Absolute: 0 10*3/uL (ref 0.0–0.1)
Basophils Relative: 1 % (ref 0–1)
Eosinophils Absolute: 0.1 10*3/uL (ref 0.0–0.7)
Eosinophils Relative: 3 % (ref 0–5)
HCT: 34.4 % — ABNORMAL LOW (ref 36.0–46.0)
Hemoglobin: 11.4 g/dL — ABNORMAL LOW (ref 12.0–15.0)
Lymphocytes Relative: 34 % (ref 12–46)
Lymphs Abs: 1.4 10*3/uL (ref 0.7–4.0)
MCH: 29.8 pg (ref 26.0–34.0)
MCHC: 33.1 g/dL (ref 30.0–36.0)
MCV: 90.1 fL (ref 78.0–100.0)
Monocytes Absolute: 0.3 10*3/uL (ref 0.1–1.0)
Monocytes Relative: 8 % (ref 3–12)
Neutro Abs: 2.2 10*3/uL (ref 1.7–7.7)
Neutrophils Relative %: 54 % (ref 43–77)
Platelets: 269 10*3/uL (ref 150–400)
RBC: 3.82 MIL/uL — ABNORMAL LOW (ref 3.87–5.11)
RDW: 13.7 % (ref 11.5–15.5)
WBC: 4.1 10*3/uL (ref 4.0–10.5)

## 2012-02-23 LAB — TYPE AND SCREEN
ABO/RH(D): B POS
Antibody Screen: NEGATIVE

## 2012-02-23 LAB — POCT I-STAT, CHEM 8
BUN: 18 mg/dL (ref 6–23)
Calcium, Ion: 1.22 mmol/L (ref 1.13–1.30)
Chloride: 102 mEq/L (ref 96–112)
Creatinine, Ser: 1.2 mg/dL — ABNORMAL HIGH (ref 0.50–1.10)
Glucose, Bld: 109 mg/dL — ABNORMAL HIGH (ref 70–99)
HCT: 36 % (ref 36.0–46.0)
Hemoglobin: 12.2 g/dL (ref 12.0–15.0)
Potassium: 4.1 mEq/L (ref 3.5–5.1)
Sodium: 138 mEq/L (ref 135–145)
TCO2: 27 mmol/L (ref 0–100)

## 2012-02-23 LAB — HEMOGLOBIN AND HEMATOCRIT, BLOOD
HCT: 35 % — ABNORMAL LOW (ref 36.0–46.0)
HCT: 32.5 % — ABNORMAL LOW (ref 36.0–46.0)
HCT: 29.1 % — ABNORMAL LOW (ref 36.0–46.0)
Hemoglobin: 11.6 g/dL — ABNORMAL LOW (ref 12.0–15.0)
Hemoglobin: 10.6 g/dL — ABNORMAL LOW (ref 12.0–15.0)
Hemoglobin: 9.8 g/dL — ABNORMAL LOW (ref 12.0–15.0)

## 2012-02-23 LAB — OCCULT BLOOD, POC DEVICE: Fecal Occult Bld: POSITIVE — AB

## 2012-02-23 LAB — PROTIME-INR
INR: 0.98 (ref 0.00–1.49)
Prothrombin Time: 12.9 seconds (ref 11.6–15.2)

## 2012-02-23 LAB — APTT: aPTT: 31 seconds (ref 24–37)

## 2012-02-23 MED ORDER — ASPIRIN EC 81 MG PO TBEC: 81.0000 mg | DELAYED_RELEASE_TABLET | Freq: Every day | ORAL | Status: DC

## 2012-02-23 MED ORDER — METOPROLOL TARTRATE 12.5 MG HALF TABLET
12.5000 mg | ORAL_TABLET | Freq: Two times a day (BID) | ORAL | Status: DC
  Administered 2012-02-23 – 2012-02-25 (×5): 12.5 mg via ORAL
  Filled 2012-02-23 (×6): qty 1

## 2012-02-23 MED ORDER — SODIUM CHLORIDE 0.9 % IV BOLUS (SEPSIS)
500.0000 mL | Freq: Once | INTRAVENOUS | Status: AC
  Administered 2012-02-23: 500 mL via INTRAVENOUS

## 2012-02-23 MED ORDER — FERROUS SULFATE 325 (65 FE) MG PO TABS
325.0000 mg | ORAL_TABLET | Freq: Every day | ORAL | Status: DC
  Administered 2012-02-23 – 2012-02-25 (×3): 325 mg via ORAL
  Filled 2012-02-23 (×4): qty 1

## 2012-02-23 MED ORDER — SODIUM CHLORIDE 0.9 % IV SOLN
INTRAVENOUS | Status: DC
  Administered 2012-02-23 (×2): via INTRAVENOUS

## 2012-02-23 MED ORDER — PANTOPRAZOLE SODIUM 40 MG PO TBEC
80.0000 mg | DELAYED_RELEASE_TABLET | Freq: Every day | ORAL | Status: DC
  Administered 2012-02-24 – 2012-02-25 (×2): 80 mg via ORAL
  Filled 2012-02-23 (×3): qty 2

## 2012-02-23 MED ORDER — CIPROFLOXACIN-DEXAMETHASONE 0.3-0.1 % OT SUSP: 2.0000 [drp] | Freq: Every day | OTIC | Status: DC | PRN

## 2012-02-23 MED ORDER — OLOPATADINE HCL 0.1 % OP SOLN: 1.0000 [drp] | Freq: Every day | OPHTHALMIC | Status: DC | PRN

## 2012-02-23 MED ORDER — POLYETHYLENE GLYCOL 3350 17 G PO PACK
17.0000 g | PACK | Freq: Every day | ORAL | Status: DC | PRN
  Filled 2012-02-23: qty 1

## 2012-02-23 MED ORDER — LOSARTAN POTASSIUM 50 MG PO TABS
100.0000 mg | ORAL_TABLET | Freq: Every day | ORAL | Status: DC
  Administered 2012-02-23 – 2012-02-25 (×3): 100 mg via ORAL
  Filled 2012-02-23 (×3): qty 2

## 2012-02-23 MED ORDER — HYDROCODONE-ACETAMINOPHEN 5-325 MG PO TABS: 1.0000 | ORAL_TABLET | Freq: Four times a day (QID) | ORAL | Status: DC | PRN

## 2012-02-23 MED ORDER — ACETAMINOPHEN 325 MG PO TABS: 650.0000 mg | ORAL_TABLET | Freq: Four times a day (QID) | ORAL | Status: DC | PRN

## 2012-02-23 MED ORDER — METOPROLOL TARTRATE 25 MG PO TABS
25.0000 mg | ORAL_TABLET | Freq: Two times a day (BID) | ORAL | Status: DC
  Filled 2012-02-23 (×2): qty 1

## 2012-02-23 MED ORDER — SODIUM CHLORIDE 0.9 % IJ SOLN
3.0000 mL | Freq: Two times a day (BID) | INTRAMUSCULAR | Status: DC
  Administered 2012-02-23 – 2012-02-25 (×3): 3 mL via INTRAVENOUS

## 2012-02-23 NOTE — ED Notes (Signed)
Attempt times 4 to start IV.  Unable to start IV by 2 staff members

## 2012-02-23 NOTE — H&P (Signed)
[facility] History and Physical  [name]:[id number] DOB: January 12, 1924 DOA: 02/23/2012  Referring physician: ED PCP: No primary provider on file.  Specialists: Dr. Randa Evens GI  Chief Complaint: BRBPR  HPI: [name] is a 79 y.o. female who presents with 2 episodes of maroon colored stool over the past 48 hours with the most recent episode at 9:30 pm last night.  The patient has a long history of diverticulosis with previous diverticular bleeds, last episode of this occuring about 2 years ago.  Dr. Randa Evens has done work up in the past.  The patient knew to come in to the hospital after this episode because of her long history of the same.  She is having no abdominal pain at this time, no changes in eating habits, stool described as soft, no constipation at this time.  In the ED patient was confirmed to be guiac positive, thankfully her HGB looked very good on initial lab workup (12.2 which is the highest it has been since Feb of 2009).  Hospitalist has been asked to admit the patient.  Review of Systems: 12 systems reviewed and negative.  Past Medical History  Diagnosis Date  . Diverticulitis    History reviewed. No pertinent past surgical history. Social History:  reports that she has never smoked. She has never used smokeless tobacco. She reports that she does not drink alcohol or use illicit drugs.   Allergies  Allergen Reactions  . Ezetimibe Anaphylaxis  . Cholestyramine Nausea And Vomiting  . Lipitor (Atorvastatin) Swelling  . Pravachol (Pravastatin) Swelling  . Statins Swelling    Swelling of mouth and lips  . Zocor (Simvastatin) Swelling    History reviewed. No pertinent family history.   Prior to Admission medications   Medication Sig Start Date End Date Taking? Authorizing Provider  acetaminophen (TYLENOL) 325 MG tablet Take 650 mg by mouth every 6 (six) hours as needed. pain   Yes Historical Provider, MD  aspirin EC 81 MG tablet Take 81 mg by mouth  daily.   Yes Historical Provider, MD  cholecalciferol (VITAMIN D) 1000 UNITS tablet Take 2,000 Units by mouth daily.   Yes Historical Provider, MD  ciprofloxacin-dexamethasone (CIPRODEX) otic suspension Place 2 drops into both ears daily as needed. For ear dryness and discomfort   Yes Historical Provider, MD  esomeprazole (NEXIUM) 40 MG capsule Take 40 mg by mouth daily before breakfast.   Yes Historical Provider, MD  ferrous sulfate 325 (65 FE) MG tablet Take 325 mg by mouth daily with breakfast.   Yes Historical Provider, MD  hydrochlorothiazide (HYDRODIURIL) 25 MG tablet Take 12.5 mg by mouth daily.   Yes Historical Provider, MD  HYDROcodone-acetaminophen (VICODIN) 5-500 MG per tablet Take 1 tablet by mouth daily as needed. pain   Yes Historical Provider, MD  losartan (COZAAR) 100 MG tablet Take 100 mg by mouth daily.   Yes Historical Provider, MD  metoprolol tartrate (LOPRESSOR) 25 MG tablet Take 25 mg by mouth 2 (two) times daily.   Yes Historical Provider, MD  Multiple Vitamin (MULITIVITAMIN WITH MINERALS) TABS Take 1 tablet by mouth daily.   Yes Historical Provider, MD  nitroGLYCERIN (NITROSTAT) 0.4 MG SL tablet Place 0.4 mg under the tongue every 5 (five) minutes as needed.   Yes Historical Provider, MD  Olopatadine HCl (PATADAY OP) Apply 1 drop to eye daily as needed. For allergy dryness   Yes Historical Provider, MD  polyethylene glycol (MIRALAX / GLYCOLAX) packet Take 17 g by mouth daily as  needed. constipation   Yes Historical Provider, MD  potassium chloride (K-DUR) 10 MEQ tablet Take 10 mEq by mouth daily.   Yes Historical Provider, MD   Physical Exam: Filed Vitals:   02/23/12 0258 02/23/12 0259 02/23/12 0301 02/23/12 0302  BP: 134/89 134/89 149/61 130/70  Pulse: 51 51 53 58  Temp: 98.5 F (36.9 C)     TempSrc: Oral     Resp: 20     SpO2: 99%       General:  NAD, resting comfortably in bed Eyes: PEERLA EOMI ENT: mucous membranes moist Neck: supple w/o JVD Cardiovascular:  RRR w/o MRG Respiratory: CTA B Abdomen: soft, nt, nd, bs+ Skin: no rash nor lesion Musculoskeletal: MAE, full ROM all 4 extremities Psychiatric: normal tone and affect Neurologic: AAOx3, grossly non-focal  Labs on Admission:  Basic Metabolic Panel:  Lab 02/23/12 1610  NA 138  K 4.1  CL 102  CO2 --  GLUCOSE 109*  BUN 18  CREATININE 1.20*  CALCIUM --  MG --  PHOS --   Liver Function Tests: No results found for this basename: AST:5,ALT:5,ALKPHOS:5,BILITOT:5,PROT:5,ALBUMIN:5 in the last 168 hours No results found for this basename: LIPASE:5,AMYLASE:5 in the last 168 hours No results found for this basename: AMMONIA:5 in the last 168 hours CBC:  Lab 02/23/12 0536 02/23/12 0124 02/23/12 0106  WBC -- -- 4.1  NEUTROABS -- -- 2.2  HGB 11.6* 12.2 11.4*  HCT 35.0* 36.0 34.4*  MCV -- -- 90.1  PLT -- -- 269   Cardiac Enzymes: No results found for this basename: CKTOTAL:5,CKMB:5,CKMBINDEX:5,TROPONINI:5 in the last 168 hours  BNP (last 3 results) No results found for this basename: PROBNP:3 in the last 8760 hours CBG: No results found for this basename: GLUCAP:5 in the last 168 hours  Radiological Exams on Admission: Dg Abd Acute W/chest  02/23/2012  *RADIOLOGY REPORT*  Clinical Data: Bloody stool.  ACUTE ABDOMEN SERIES (ABDOMEN 2 VIEW & CHEST 1 VIEW)  Comparison: CT 12/03/2011  Findings: Heart is normal size.  Small to moderate sized hiatal hernia.  Tortuosity of the thoracic aorta.  Lungs are clear.  No effusions.  Nonobstructive bowel gas pattern.  No free air.  No organomegaly or suspicious calcification.  Prior cholecystectomy.  Degenerative changes in the lumbar spine and hips.  IMPRESSION: No evidence of bowel obstruction or free air.  No acute findings.   Original Report Authenticated By: Charlett Nose, M.D.     EKG: Independently reviewed.  Assessment/Plan Principal Problem:  *Lower GI bleed Active Problems:  History of GI diverticular bleed   Diverticulosis   1. LGIB - admitting patient, q8h H/Hs, NS at 75 cc/hr, holding her HCTZ, NPO except for ice chips, getting 500 cc bolus in ED, not hypotensive at all at present time, no evidence of tachycardia but will put on tele monitor to be cautious.  No evidence to transfuse at this point with HGB of 12.2.  Will hold her ASA 81 as well. 2. History of diverticular bleeds - long and recurrent history of diverticular bleeds in the past, very likely that this is also a diverticular bleed.  Consider GI consultation if needed but apparently per patient they are unable to do a colonoscopy on her.  Last time they performed a barium enema which demonstrated 1 polyp and numerous diverticula. 3. HTN - continue home meds    Code Status: Full Code (must indicate code status--if unknown or must be presumed, indicate so) Family Communication: No family in room (indicate person spoken  with, if applicable, with phone number if by telephone) Disposition Plan: Admit to obs (indicate anticipated LOS)  Time spent: 50 min  Lexis Potenza M. [facility] Pager [phone]  If 7PM-7AM, please contact night-coverage [email or web address] Password [email or web address] 02/23/2012, 6:06 AM

## 2012-02-23 NOTE — ED Notes (Signed)
Patient states that she has been having bloody stools off and on.  Tonight she noticed some dark red and bright red blood in her stool

## 2012-02-23 NOTE — ED Notes (Signed)
Pt states she is having dark rectal bleeding. Pt states she has had this before a year ago and they diagnosed her with diverticulitis.  Pt is ambulatory, alert and oriented. No signs of distress. Pt denies abdominal pain.

## 2012-02-23 NOTE — Progress Notes (Signed)
Pt had one BM earlier today-stool brownish/greenish with halo of dark pink in hat. Tol liquids well without complaints

## 2012-02-23 NOTE — ED Provider Notes (Signed)
History     CSN: [id number]  Arrival date & time 02/23/12  0014   First MD Initiated Contact with Patient 02/23/12 0017      Chief Complaint  Patient presents with  . Rectal Bleeding    (Consider location/radiation/quality/duration/timing/severity/associated sxs/prior treatment) Patient is a 79 y.o. female presenting with hematochezia. The history is provided by the patient. No language interpreter was used.  Rectal Bleeding  The current episode started yesterday. The onset was sudden. The problem occurs continuously. The problem has been unchanged. The patient is experiencing no pain. The stool is described as soft. There was no prior successful therapy. There was no prior unsuccessful therapy. Pertinent negatives include no anorexia, no abdominal pain, no diarrhea and no hemorrhoids. She has been behaving normally. She has been eating and drinking normally. Her past medical history does not include inflammatory bowel disease or recent abdominal injury. There were no sick contacts. She has received no recent medical care.    Past Medical History  Diagnosis Date  . Diverticulitis     History reviewed. No pertinent past surgical history.  History reviewed. No pertinent family history.  History  Substance Use Topics  . Smoking status: Never Smoker   . Smokeless tobacco: Never Used  . Alcohol Use: No    OB History    Grav Para Term Preterm Abortions TAB SAB Ect Mult Living                  Review of Systems  Gastrointestinal: Positive for hematochezia and anal bleeding. Negative for abdominal pain, diarrhea, constipation, anorexia and hemorrhoids.  All other systems reviewed and are negative.    Allergies  Review of patient's allergies indicates no known allergies.  Home Medications   Current Outpatient Rx  Name  Route  Sig  Dispense  Refill  . ACETAMINOPHEN 325 MG PO TABS   Oral   Take 650 mg by mouth every 6 (six) hours as needed.         Marland Kitchen VITAMIN D 1000  UNITS PO TABS   Oral   Take 2,000 Units by mouth daily.         Marland Kitchen CIPROFLOXACIN-DEXAMETHASONE 0.3-0.1 % OT SUSP   Both Ears   Place 4 drops into both ears 2 (two) times daily as needed.         Marland Kitchen ESOMEPRAZOLE MAGNESIUM 40 MG PO CPDR   Oral   Take 40 mg by mouth daily before breakfast.         . FERROUS SULFATE 325 (65 FE) MG PO TABS   Oral   Take 325 mg by mouth daily with breakfast.         . HYDROCHLOROTHIAZIDE 25 MG PO TABS   Oral   Take 12.5 mg by mouth daily.         Marland Kitchen HYDROCODONE-ACETAMINOPHEN 5-500 MG PO TABS   Oral   Take 1 tablet by mouth daily as needed.         Marland Kitchen LOSARTAN POTASSIUM 100 MG PO TABS   Oral   Take 100 mg by mouth daily.         Marland Kitchen METOPROLOL TARTRATE 25 MG PO TABS   Oral   Take 25 mg by mouth 2 (two) times daily.         . ADULT MULTIVITAMIN W/MINERALS CH   Oral   Take 1 tablet by mouth daily.         Marland Kitchen NITROGLYCERIN 0.4 MG SL SUBL  Sublingual   Place 0.4 mg under the tongue every 5 (five) minutes as needed.         Marland Kitchen POLYETHYLENE GLYCOL 3350 PO PACK   Oral   Take 17 g by mouth daily.         Marland Kitchen POTASSIUM CHLORIDE ER 10 MEQ PO TBCR   Oral   Take 10 mEq by mouth daily.           BP 164/77  Pulse 59  Temp 99.2 F (37.3 C)  Resp 20  SpO2 100%  Physical Exam  Constitutional: She is oriented to person, place, and time. She appears well-developed and well-nourished. No distress.  HENT:  Head: Normocephalic and atraumatic.  Mouth/Throat: Oropharynx is clear and moist.  Eyes: Conjunctivae normal are normal. Pupils are equal, round, and reactive to light.  Neck: Normal range of motion. Neck supple.  Cardiovascular: Normal rate, regular rhythm and intact distal pulses.   Pulmonary/Chest: Effort normal and breath sounds normal. She has no wheezes.  Abdominal: Soft. Bowel sounds are normal. There is no tenderness. There is no rebound and no guarding.  Genitourinary: Guaiac positive stool.  Musculoskeletal: Normal  range of motion.  Neurological: She is alert and oriented to person, place, and time.  Skin: Skin is warm and dry.  Psychiatric: She has a normal mood and affect.    ED Course  Procedures (including critical care time)   Labs Reviewed  CBC WITH DIFFERENTIAL  TYPE AND SCREEN   No results found.   No diagnosis found.    MDM  H/o of diverticular bleeding will keep NPO and admit        Dimitrious Micciche K Harlow Carrizales-Rasch, MD 02/23/12 1610

## 2012-02-23 NOTE — Progress Notes (Signed)
[facility] PROGRESS NOTE  [name] ZOX:[id number] DOB: 1923-09-01 DOA: 02/23/2012 PCP: No primary provider on file.  Assessment/Plan: 1. LCIB: probably a diverticular bleed.  - admitted to telemetry  And getting serial H&H . Currently her hemoglobin is stable at between 10 to 11. . She is on IV fluids, and clear liquid diet. She was previously worked up by Dr Randa Evens. As per the conversation, she was not a candidate for colonoscopy. If her h&H drops dramatically, we will call GI for further recommendations.   2. Hypertension: controlled.   3. DVT prophylaxis: SCD'S  Code Status: full code Family Communication: none at bedside Disposition Plan: pending.    Consultants:  none  Procedures:  none  HPI/Subjective: NO new complaints.   Objective: Filed Vitals:   02/23/12 0615 02/23/12 0630 02/23/12 0645 02/23/12 0812  BP: 135/65 125/74 128/59 152/68  Pulse: 53 55 54 57  Temp:    97.8 F (36.6 C)  TempSrc:    Oral  Resp:    20  Height:    5\' 4"  (1.626 m)  Weight:    93.713 kg (206 lb 9.6 oz)  SpO2: 100% 100% 99% 100%   No intake or output data in the 24 hours ending 02/23/12 0902 Filed Weights   02/23/12 0812  Weight: 93.713 kg (206 lb 9.6 oz)    Exam:   General:  Alert afebrile comfortable  Cardiovascular: s1s2  Respiratory: CTAB  Abdomen: soft NT NDBS+  Data Reviewed: Basic Metabolic Panel:  Lab 02/23/12 8119  NA 138  K 4.1  CL 102  CO2 --  GLUCOSE 109*  BUN 18  CREATININE 1.20*  CALCIUM --  MG --  PHOS --   Liver Function Tests: No results found for this basename: AST:5,ALT:5,ALKPHOS:5,BILITOT:5,PROT:5,ALBUMIN:5 in the last 168 hours No results found for this basename: LIPASE:5,AMYLASE:5 in the last 168 hours No results found for this basename: AMMONIA:5 in the last 168 hours CBC:  Lab 02/23/12 0536 02/23/12 0124 02/23/12 0106  WBC -- -- 4.1  NEUTROABS -- -- 2.2  HGB 11.6* 12.2 11.4*  HCT 35.0* 36.0 34.4*  MCV -- -- 90.1    PLT -- -- 269   Cardiac Enzymes: No results found for this basename: CKTOTAL:5,CKMB:5,CKMBINDEX:5,TROPONINI:5 in the last 168 hours BNP (last 3 results) No results found for this basename: PROBNP:3 in the last 8760 hours CBG: No results found for this basename: GLUCAP:5 in the last 168 hours  No results found for this or any previous visit (from the past 240 hour(s)).   Studies: Dg Abd Acute W/chest  02/23/2012  *RADIOLOGY REPORT*  Clinical Data: Bloody stool.  ACUTE ABDOMEN SERIES (ABDOMEN 2 VIEW & CHEST 1 VIEW)  Comparison: CT 12/03/2011  Findings: Heart is normal size.  Small to moderate sized hiatal hernia.  Tortuosity of the thoracic aorta.  Lungs are clear.  No effusions.  Nonobstructive bowel gas pattern.  No free air.  No organomegaly or suspicious calcification.  Prior cholecystectomy.  Degenerative changes in the lumbar spine and hips.  IMPRESSION: No evidence of bowel obstruction or free air.  No acute findings.   Original Report Authenticated By: Charlett Nose, M.D.     Scheduled Meds:   . ferrous sulfate  325 mg Oral Q breakfast  . losartan  100 mg Oral Daily  . metoprolol tartrate  25 mg Oral BID  . pantoprazole  80 mg Oral Q1200  . sodium chloride  3 mL Intravenous Q12H   Continuous Infusions:   .  sodium chloride 75 mL/hr at 02/23/12 1610    Principal Problem:  *Lower GI bleed Active Problems:  History of GI diverticular bleed  Diverticulosis        [facility]  [facility] Pager [phone]. If 8PM-8AM, please contact night-coverage at [email or web address], password [facility] 02/23/2012, 9:02 AM  LOS: 0 days

## 2012-02-24 ENCOUNTER — Encounter (HOSPITAL_COMMUNITY): Payer: Self-pay | Admitting: Gastroenterology

## 2012-02-24 LAB — BASIC METABOLIC PANEL WITH GFR
BUN: 11 mg/dL (ref 6–23)
CO2: 21 meq/L (ref 19–32)
Calcium: 8.6 mg/dL (ref 8.4–10.5)
Chloride: 106 meq/L (ref 96–112)
Creatinine, Ser: 1 mg/dL (ref 0.50–1.10)
GFR calc Af Amer: 57 mL/min — ABNORMAL LOW
GFR calc non Af Amer: 50 mL/min — ABNORMAL LOW
Glucose, Bld: 84 mg/dL (ref 70–99)
Potassium: 3.6 meq/L (ref 3.5–5.1)
Sodium: 138 meq/L (ref 135–145)

## 2012-02-24 LAB — HEMOGLOBIN AND HEMATOCRIT, BLOOD
HCT: 29.7 % — ABNORMAL LOW (ref 36.0–46.0)
HCT: 30 % — ABNORMAL LOW (ref 36.0–46.0)
Hemoglobin: 9.8 g/dL — ABNORMAL LOW (ref 12.0–15.0)
Hemoglobin: 9.9 g/dL — ABNORMAL LOW (ref 12.0–15.0)

## 2012-02-24 LAB — CBC
HCT: 29.5 % — ABNORMAL LOW (ref 36.0–46.0)
Hemoglobin: 9.8 g/dL — ABNORMAL LOW (ref 12.0–15.0)
MCH: 30.2 pg (ref 26.0–34.0)
MCHC: 33.2 g/dL (ref 30.0–36.0)
MCV: 90.8 fL (ref 78.0–100.0)
Platelets: 248 10*3/uL (ref 150–400)
RBC: 3.25 MIL/uL — ABNORMAL LOW (ref 3.87–5.11)
RDW: 14 % (ref 11.5–15.5)
WBC: 3 10*3/uL — ABNORMAL LOW (ref 4.0–10.5)

## 2012-02-24 LAB — GLUCOSE, CAPILLARY: Glucose-Capillary: 90 mg/dL (ref 70–99)

## 2012-02-24 NOTE — Care Management (Signed)
UR completed 

## 2012-02-24 NOTE — Progress Notes (Signed)
[facility] PROGRESS NOTE  [name] ZOX:[id number] DOB: 06/07/1923 DOA: 02/23/2012 PCP: No primary provider on file.  Assessment/Plan: 1. LCIB: probably a diverticular bleed.  - admitted to telemetry  And getting serial H&H . She is on IV fluids, and clear liquid diet. She was previously worked up by Dr Randa Evens. As per the conversation, she was not a candidate for colonoscopy. Her H&h dropped to 9.8 from 12.2. As per the patient and the RN, she had one bloody bowel movement early yesterday morning and a normal BM this am. If she doesn't have any more bleeding per rectum and her H&H doesn't drop will advance her diet, but if she continues to bleed, we will order RBC scan later today. GI recommendations from Dr Ewing Schlein appreciated.   2. Hypertension: controlled.   3. DVT prophylaxis: SCD'S  Code Status: full code Family Communication: none at bedside Disposition Plan: pending.    Consultants:  none  Procedures:  none  HPI/Subjective: NO new complaints.   Objective: Filed Vitals:   02/24/12 0125 02/24/12 0404 02/24/12 0900 02/24/12 1300  BP: 102/55 104/52 122/55 116/61  Pulse: 59 56  58  Temp: 98.1 F (36.7 C) 98 F (36.7 C) 98.2 F (36.8 C) 98.6 F (37 C)  TempSrc: Oral Oral Oral Oral  Resp: 20 20 20 20   Height:      Weight:  95.029 kg (209 lb 8 oz)    SpO2: 100% 100% 100% 98%    Intake/Output Summary (Last 24 hours) at 02/24/12 1540 Last data filed at 02/24/12 1413  Gross per 24 hour  Intake    843 ml  Output   1151 ml  Net   -308 ml   Filed Weights   02/23/12 0812 02/24/12 0404  Weight: 93.713 kg (206 lb 9.6 oz) 95.029 kg (209 lb 8 oz)    Exam:   General:  Alert afebrile comfortable  Cardiovascular: s1s2  Respiratory: CTAB  Abdomen: soft NT NDBS+  Data Reviewed: Basic Metabolic Panel:  Lab 02/24/12 8119 02/23/12 0124  NA 138 138  K 3.6 4.1  CL 106 102  CO2 21 --  GLUCOSE 84 109*  BUN 11 18  CREATININE 1.00 1.20*  CALCIUM 8.6 --    MG -- --  PHOS -- --   Liver Function Tests: No results found for this basename: AST:5,ALT:5,ALKPHOS:5,BILITOT:5,PROT:5,ALBUMIN:5 in the last 168 hours No results found for this basename: LIPASE:5,AMYLASE:5 in the last 168 hours No results found for this basename: AMMONIA:5 in the last 168 hours CBC:  Lab 02/24/12 1424 02/24/12 0545 02/23/12 2102 02/23/12 1217 02/23/12 0536 02/23/12 0106  WBC -- 3.0* -- -- -- 4.1  NEUTROABS -- -- -- -- -- 2.2  HGB 9.8* 9.8* 9.8* 10.6* 11.6* --  HCT 29.7* 29.5* 29.1* 32.5* 35.0* --  MCV -- 90.8 -- -- -- 90.1  PLT -- 248 -- -- -- 269   Cardiac Enzymes: No results found for this basename: CKTOTAL:5,CKMB:5,CKMBINDEX:5,TROPONINI:5 in the last 168 hours BNP (last 3 results) No results found for this basename: PROBNP:3 in the last 8760 hours CBG:  Lab 02/24/12 0559  GLUCAP 90    No results found for this or any previous visit (from the past 240 hour(s)).   Studies: Dg Abd Acute W/chest  02/23/2012  *RADIOLOGY REPORT*  Clinical Data: Bloody stool.  ACUTE ABDOMEN SERIES (ABDOMEN 2 VIEW & CHEST 1 VIEW)  Comparison: CT 12/03/2011  Findings: Heart is normal size.  Small to moderate sized hiatal hernia.  Tortuosity  of the thoracic aorta.  Lungs are clear.  No effusions.  Nonobstructive bowel gas pattern.  No free air.  No organomegaly or suspicious calcification.  Prior cholecystectomy.  Degenerative changes in the lumbar spine and hips.  IMPRESSION: No evidence of bowel obstruction or free air.  No acute findings.   Original Report Authenticated By: Charlett Nose, M.D.     Scheduled Meds:    . ferrous sulfate  325 mg Oral Q breakfast  . losartan  100 mg Oral Daily  . metoprolol tartrate  12.5 mg Oral BID  . pantoprazole  80 mg Oral Q1200  . sodium chloride  3 mL Intravenous Q12H   Continuous Infusions:   Principal Problem:  *Lower GI bleed Active Problems:  History of GI diverticular bleed  Diverticulosis        [facility]  [facility] Pager [phone]. If 8PM-8AM, please contact night-coverage at [email or web address], password [facility] 02/24/2012, 3:40 PM  LOS: 1 day

## 2012-02-24 NOTE — Consult Note (Signed)
Reason for Consult: Lower GI bleeding Referring Physician: Hospital team  [name] is an 78 y.o. female.  HPI: Patient seen at the request of the hospital team for almost certainly diverticular bleeding. She has a long history of diverticular problems and unfortunately her sigmoid is still filled with diverticuli that on last colonoscopy attempt in 2012 or 13 my partner Dr. Randa Evens was unable to get past it and a barium enema was done which was okay and in the past colonoscopies have shown a lipoma on the right side but no other obvious abnormalities. She's had no pain no nausea or vomiting and no hemorrhoidal complaints and her blood was all bright red and mixed in her stool but she has not passed any blood since he's been here and wants to know when she can advance her diet and when she came home. Her family history is negative but she was on an aspirin a day at home but no other blood thinners and she has no other complaints. Past Medical History  Diagnosis Date  . Diverticulitis   . History of GI diverticular bleed   . HTN (hypertension)     History reviewed. No pertinent past surgical history.  History reviewed. No pertinent family history.  Social History:  reports that she has never smoked. She has never used smokeless tobacco. She reports that she does not drink alcohol or use illicit drugs.  Allergies:  Allergies  Allergen Reactions  . Ezetimibe Anaphylaxis  . Cholestyramine Nausea And Vomiting  . Lipitor (Atorvastatin) Swelling  . Pravachol (Pravastatin) Swelling  . Statins Swelling    Swelling of mouth and lips  . Zocor (Simvastatin) Swelling    Medications: I have reviewed the patient's current medications.  Results for orders placed during the hospital encounter of 02/23/12 (from the past 48 hour(s))  TYPE AND SCREEN     Status: Normal   Collection Time   02/23/12  1:04 AM      Component Value Range Comment   ABO/RH(D) B POS      Antibody Screen NEG      Sample  Expiration 02/26/2012     CBC WITH DIFFERENTIAL     Status: Abnormal   Collection Time   02/23/12  1:06 AM      Component Value Range Comment   WBC 4.1  4.0 - 10.5 K/uL    RBC 3.82 (*) 3.87 - 5.11 MIL/uL    Hemoglobin 11.4 (*) 12.0 - 15.0 g/dL    HCT 16.1 (*) 09.6 - 46.0 %    MCV 90.1  78.0 - 100.0 fL    MCH 29.8  26.0 - 34.0 pg    MCHC 33.1  30.0 - 36.0 g/dL    RDW 04.5  40.9 - 81.1 %    Platelets 269  150 - 400 K/uL    Neutrophils Relative 54  43 - 77 %    Neutro Abs 2.2  1.7 - 7.7 K/uL    Lymphocytes Relative 34  12 - 46 %    Lymphs Abs 1.4  0.7 - 4.0 K/uL    Monocytes Relative 8  3 - 12 %    Monocytes Absolute 0.3  0.1 - 1.0 K/uL    Eosinophils Relative 3  0 - 5 %    Eosinophils Absolute 0.1  0.0 - 0.7 K/uL    Basophils Relative 1  0 - 1 %    Basophils Absolute 0.0  0.0 - 0.1 K/uL   POCT I-STAT, CHEM  8     Status: Abnormal   Collection Time   02/23/12  1:24 AM      Component Value Range Comment   Sodium 138  135 - 145 mEq/L    Potassium 4.1  3.5 - 5.1 mEq/L    Chloride 102  96 - 112 mEq/L    BUN 18  6 - 23 mg/dL    Creatinine, Ser 1.30 (*) 0.50 - 1.10 mg/dL    Glucose, Bld 865 (*) 70 - 99 mg/dL    Calcium, Ion 7.84  6.96 - 1.30 mmol/L    TCO2 27  0 - 100 mmol/L    Hemoglobin 12.2  12.0 - 15.0 g/dL    HCT 29.5  28.4 - 13.2 %   OCCULT BLOOD, POC DEVICE     Status: Abnormal   Collection Time   02/23/12  1:26 AM      Component Value Range Comment   Fecal Occult Bld POSITIVE (*) NEGATIVE   HEMOGLOBIN AND HEMATOCRIT, BLOOD     Status: Abnormal   Collection Time   02/23/12  5:36 AM      Component Value Range Comment   Hemoglobin 11.6 (*) 12.0 - 15.0 g/dL    HCT 44.0 (*) 10.2 - 46.0 %   PROTIME-INR     Status: Normal   Collection Time   02/23/12  5:36 AM      Component Value Range Comment   Prothrombin Time 12.9  11.6 - 15.2 seconds    INR 0.98  0.00 - 1.49   APTT     Status: Normal   Collection Time   02/23/12  5:36 AM      Component Value Range Comment   aPTT 31  24  - 37 seconds   HEMOGLOBIN AND HEMATOCRIT, BLOOD     Status: Abnormal   Collection Time   02/23/12 12:17 PM      Component Value Range Comment   Hemoglobin 10.6 (*) 12.0 - 15.0 g/dL    HCT 72.5 (*) 36.6 - 46.0 %   HEMOGLOBIN AND HEMATOCRIT, BLOOD     Status: Abnormal   Collection Time   02/23/12  9:02 PM      Component Value Range Comment   Hemoglobin 9.8 (*) 12.0 - 15.0 g/dL    HCT 44.0 (*) 34.7 - 46.0 %   CBC     Status: Abnormal   Collection Time   02/24/12  5:45 AM      Component Value Range Comment   WBC 3.0 (*) 4.0 - 10.5 K/uL    RBC 3.25 (*) 3.87 - 5.11 MIL/uL    Hemoglobin 9.8 (*) 12.0 - 15.0 g/dL    HCT 42.5 (*) 95.6 - 46.0 %    MCV 90.8  78.0 - 100.0 fL    MCH 30.2  26.0 - 34.0 pg    MCHC 33.2  30.0 - 36.0 g/dL    RDW 38.7  56.4 - 33.2 %    Platelets 248  150 - 400 K/uL   BASIC METABOLIC PANEL     Status: Abnormal   Collection Time   02/24/12  5:45 AM      Component Value Range Comment   Sodium 138  135 - 145 mEq/L    Potassium 3.6  3.5 - 5.1 mEq/L    Chloride 106  96 - 112 mEq/L    CO2 21  19 - 32 mEq/L    Glucose, Bld 84  70 - 99 mg/dL  BUN 11  6 - 23 mg/dL    Creatinine, Ser 2.95  0.50 - 1.10 mg/dL    Calcium 8.6  8.4 - 62.1 mg/dL    GFR calc non Af Amer 50 (*) >90 mL/min    GFR calc Af Amer 57 (*) >90 mL/min   GLUCOSE, CAPILLARY     Status: Normal   Collection Time   02/24/12  5:59 AM      Component Value Range Comment   Glucose-Capillary 90  70 - 99 mg/dL    Comment 1 Notify RN       Dg Abd Acute W/chest  02/23/2012  *RADIOLOGY REPORT*  Clinical Data: Bloody stool.  ACUTE ABDOMEN SERIES (ABDOMEN 2 VIEW & CHEST 1 VIEW)  Comparison: CT 12/03/2011  Findings: Heart is normal size.  Small to moderate sized hiatal hernia.  Tortuosity of the thoracic aorta.  Lungs are clear.  No effusions.  Nonobstructive bowel gas pattern.  No free air.  No organomegaly or suspicious calcification.  Prior cholecystectomy.  Degenerative changes in the lumbar spine and hips.   IMPRESSION: No evidence of bowel obstruction or free air.  No acute findings.   Original Report Authenticated By: Charlett Nose, M.D.     ROS negative except above Blood pressure 122/55, pulse 56, temperature 98.2 F (36.8 C), temperature source Oral, resp. rate 20, height 5\' 4"  (1.626 m), weight 95.029 kg (209 lb 8 oz), SpO2 100.00%. Physical Exam vital signs stable afebrile no acute distress abdomen is soft nontender hospital computer and office computer reviewed and case discussed with hospital team hemoglobin fairly stable BUN and creatinine normal  Assessment/Plan: Diverticular bleeding currently stable Plan: Continue clear liquids for now but if no signs of bleeding today can advance diet either this evening or tomorrow when necessary signs of increased bleeding would probably proceed with a nuclear scan first and will follow with you and my partner Dr. Randa Evens happy to see back when necessary and as an aside we might reevaluate whether she needs an aspirin a day and when to restart it and I would schedule her for a followup chest CT later in January or February based on her last abdominal CT report [facility] E 02/24/2012, 10:32 AM

## 2012-02-25 LAB — HEMOGLOBIN AND HEMATOCRIT, BLOOD
HCT: 29.3 % — ABNORMAL LOW (ref 36.0–46.0)
HCT: 31.7 % — ABNORMAL LOW (ref 36.0–46.0)
Hemoglobin: 9.6 g/dL — ABNORMAL LOW (ref 12.0–15.0)
Hemoglobin: 10.5 g/dL — ABNORMAL LOW (ref 12.0–15.0)

## 2012-02-25 LAB — GLUCOSE, CAPILLARY: Glucose-Capillary: 88 mg/dL (ref 70–99)

## 2012-02-25 NOTE — Discharge Summary (Signed)
Physician Discharge Summary  [name] ZOX:[id number] DOB: Jun 15, 1923 DOA: 02/23/2012  PCP: No primary provider on file.  Admit date: 02/23/2012 Discharge date: 02/25/2012  Time spent: 30 minutes  Recommendations for Outpatient Follow-up:  1. Follow up with Dr Randa Evens in one week and evaluate to see if she can be restarted on aspirin.   Discharge Diagnoses:  Principal Problem:  *Lower GI bleed Active Problems:  History of GI diverticular bleed  Diverticulosis   Discharge Condition: stable  Diet recommendation: low salt diet  Filed Weights   02/23/12 0812 02/24/12 0404 02/25/12 0514  Weight: 93.713 kg (206 lb 9.6 oz) 95.029 kg (209 lb 8 oz) 94.394 kg (208 lb 1.6 oz)    History of present illness:  [name] is a 79 y.o. female who presents with 2 episodes of maroon colored stool over the past 48 hours with the most recent episode at 9:30 pm last night. The patient has a long history of diverticulosis with previous diverticular bleeds, last episode of this occuring about 2 years ago. Dr. Randa Evens has done work up in the past. The patient knew to come in to the hospital after this episode because of her long history of the same. She is having no abdominal pain at this time, no changes in eating habits, stool described as soft, no constipation at this time.  In the ED patient was confirmed to be guiac positive, thankfully her HGB looked very good on initial lab workup (12.2 which is the highest it has been since Feb of 2009). Hospitalist has been asked to admit the patient.   Hospital Course:    1. LCIB: probably a diverticular bleed.  - admitted to telemetry . She was on IV fluids, and clear liquid diet. She was previously worked up by Dr Randa Evens. As per the conversation, she was not a candidate for colonoscopy. Her H&h dropped to 9.8 from 12.2. As per the patient and the RN, she had one bloody bowel movement on the day of admission morning and a normal BM this am. she didn't  have any more bleeding per rectum and her H&H did not drop,,her diet was advanced and she tolerated regular diet without any issues. GI recommendations from Dr Ewing Schlein called. Recommended to follow up with Dr Randa Evens as outpatient, if there is no bleeding.  We will hold aspirin for now .  2. Hypertension: controlled.    Consultations:  GI  Discharge Exam: Filed Vitals:   02/24/12 1300 02/24/12 2044 02/25/12 0514 02/25/12 1037  BP: 116/61 121/57 124/64 122/55  Pulse: 58 65 63 60  Temp: 98.6 F (37 C) 98.2 F (36.8 C) 98 F (36.7 C) 98.4 F (36.9 C)  TempSrc: Oral Oral Oral Oral  Resp: 20 18 18    Height:      Weight:   94.394 kg (208 lb 1.6 oz)   SpO2: 98% 100% 97%     General: alert afebrile comfortable Cardiovascular: s1s2 RRR Respiratory: CTAB Abdomen : soft NT ND BS+ Extremties: no pedal edema.   Discharge Instructions  Discharge Orders    Future Orders Please Complete By Expires   Diet - low sodium heart healthy      Discharge instructions      Comments:   Follow up with Dr Randa Evens i one week.   Activity as tolerated - No restrictions      Call MD for:  persistant nausea and vomiting          Medication List  As of 02/25/2012  3:28 PM    STOP taking these medications         aspirin EC 81 MG tablet      TAKE these medications         acetaminophen 325 MG tablet   Commonly known as: TYLENOL   Take 650 mg by mouth every 6 (six) hours as needed. pain      cholecalciferol 1000 UNITS tablet   Commonly known as: VITAMIN D   Take 2,000 Units by mouth daily.      ciprofloxacin-dexamethasone otic suspension   Commonly known as: CIPRODEX   Place 2 drops into both ears daily as needed. For ear dryness and discomfort      esomeprazole 40 MG capsule   Commonly known as: NEXIUM   Take 40 mg by mouth daily before breakfast.      ferrous sulfate 325 (65 FE) MG tablet   Take 325 mg by mouth daily with breakfast.      hydrochlorothiazide 25 MG tablet    Commonly known as: HYDRODIURIL   Take 12.5 mg by mouth daily.      HYDROcodone-acetaminophen 5-500 MG per tablet   Commonly known as: VICODIN   Take 1 tablet by mouth daily as needed. pain      losartan 100 MG tablet   Commonly known as: COZAAR   Take 100 mg by mouth daily.      metoprolol tartrate 25 MG tablet   Commonly known as: LOPRESSOR   Take 25 mg by mouth 2 (two) times daily.      multivitamin with minerals Tabs   Take 1 tablet by mouth daily.      nitroGLYCERIN 0.4 MG SL tablet   Commonly known as: NITROSTAT   Place 0.4 mg under the tongue every 5 (five) minutes as needed.      PATADAY OP   Apply 1 drop to eye daily as needed. For allergy dryness      polyethylene glycol packet   Commonly known as: MIRALAX / GLYCOLAX   Take 17 g by mouth daily as needed. constipation      potassium chloride 10 MEQ tablet   Commonly known as: K-DUR   Take 10 mEq by mouth daily.          The results of significant diagnostics from this hospitalization (including imaging, microbiology, ancillary and laboratory) are listed below for reference.    Significant Diagnostic Studies: Dg Abd Acute W/chest  02/23/2012  *RADIOLOGY REPORT*  Clinical Data: Bloody stool.  ACUTE ABDOMEN SERIES (ABDOMEN 2 VIEW & CHEST 1 VIEW)  Comparison: CT 12/03/2011  Findings: Heart is normal size.  Small to moderate sized hiatal hernia.  Tortuosity of the thoracic aorta.  Lungs are clear.  No effusions.  Nonobstructive bowel gas pattern.  No free air.  No organomegaly or suspicious calcification.  Prior cholecystectomy.  Degenerative changes in the lumbar spine and hips.  IMPRESSION: No evidence of bowel obstruction or free air.  No acute findings.   Original Report Authenticated By: Charlett Nose, M.D.     Microbiology: No results found for this or any previous visit (from the past 240 hour(s)).   Labs: Basic Metabolic Panel:  Lab 02/24/12 1610 02/23/12 0124  NA 138 138  K 3.6 4.1  CL 106 102  CO2 21  --  GLUCOSE 84 109*  BUN 11 18  CREATININE 1.00 1.20*  CALCIUM 8.6 --  MG -- --  PHOS -- --   Liver  Function Tests: No results found for this basename: AST:5,ALT:5,ALKPHOS:5,BILITOT:5,PROT:5,ALBUMIN:5 in the last 168 hours No results found for this basename: LIPASE:5,AMYLASE:5 in the last 168 hours No results found for this basename: AMMONIA:5 in the last 168 hours CBC:  Lab 02/25/12 1336 02/25/12 0536 02/24/12 2112 02/24/12 1424 02/24/12 0545 02/23/12 0106  WBC -- -- -- -- 3.0* 4.1  NEUTROABS -- -- -- -- -- 2.2  HGB 10.5* 9.6* 9.9* 9.8* 9.8* --  HCT 31.7* 29.3* 30.0* 29.7* 29.5* --  MCV -- -- -- -- 90.8 90.1  PLT -- -- -- -- 248 269   Cardiac Enzymes: No results found for this basename: CKTOTAL:5,CKMB:5,CKMBINDEX:5,TROPONINI:5 in the last 168 hours BNP: BNP (last 3 results) No results found for this basename: PROBNP:3 in the last 8760 hours CBG:  Lab 02/25/12 0649 02/24/12 0559  GLUCAP 88 90       Signed:  Maydell Knoebel  [facility] 02/25/2012, 3:28 PM

## 2012-02-25 NOTE — Progress Notes (Signed)
Patient seen earlier today, doing well with a stable hemoglobin, in fact a slight increase from 9.9 to a current level of 10.5. No bowel movements for the past 48 hours. Agree with plan for discharge.  Florencia Reasons, M.D. [phone]

## 2012-02-25 NOTE — Progress Notes (Signed)
[name] to be D/C'd Home per MD order.  Discussed with the patient and all questions fully answered.   [name], [name]  Home Medication Instructions [id number]   Printed on:02/25/12 1837  Medication Information                    metoprolol tartrate (LOPRESSOR) 25 MG tablet Take 25 mg by mouth 2 (two) times daily.           potassium chloride (K-DUR) 10 MEQ tablet Take 10 mEq by mouth daily.           losartan (COZAAR) 100 MG tablet Take 100 mg by mouth daily.           ferrous sulfate 325 (65 FE) MG tablet Take 325 mg by mouth daily with breakfast.           cholecalciferol (VITAMIN D) 1000 UNITS tablet Take 2,000 Units by mouth daily.           hydrochlorothiazide (HYDRODIURIL) 25 MG tablet Take 12.5 mg by mouth daily.           esomeprazole (NEXIUM) 40 MG capsule Take 40 mg by mouth daily before breakfast.           Multiple Vitamin (MULITIVITAMIN WITH MINERALS) TABS Take 1 tablet by mouth daily.           polyethylene glycol (MIRALAX / GLYCOLAX) packet Take 17 g by mouth daily as needed. constipation           ciprofloxacin-dexamethasone (CIPRODEX) otic suspension Place 2 drops into both ears daily as needed. For ear dryness and discomfort           HYDROcodone-acetaminophen (VICODIN) 5-500 MG per tablet Take 1 tablet by mouth daily as needed. pain           nitroGLYCERIN (NITROSTAT) 0.4 MG SL tablet Place 0.4 mg under the tongue every 5 (five) minutes as needed.           acetaminophen (TYLENOL) 325 MG tablet Take 650 mg by mouth every 6 (six) hours as needed. pain           Olopatadine HCl (PATADAY OP) Apply 1 drop to eye daily as needed. For allergy dryness             VVS, Skin clean, dry and intact without evidence of skin break down, no evidence of skin tears noted. IV catheter discontinued intact. Site without signs and symptoms of complications. Dressing and pressure applied.  An After Visit Summary was printed and given to the  patient. Patient escorted via WC, and D/C home via private auto.  [name] 02/25/2012 6:37 PM

## 2012-11-13 ENCOUNTER — Observation Stay (HOSPITAL_COMMUNITY): Payer: Medicare Other

## 2012-11-13 ENCOUNTER — Encounter (HOSPITAL_COMMUNITY): Payer: Self-pay | Admitting: *Deleted

## 2012-11-13 ENCOUNTER — Inpatient Hospital Stay (HOSPITAL_COMMUNITY)
Admission: EM | Admit: 2012-11-13 | Discharge: 2012-11-17 | DRG: 378 | Disposition: A | Discharge status: Home Health | Payer: Medicare Other | Attending: Internal Medicine | Admitting: Internal Medicine

## 2012-11-13 DIAGNOSIS — K219 Gastro-esophageal reflux disease without esophagitis: Secondary | ICD-10-CM | POA: Diagnosis present

## 2012-11-13 DIAGNOSIS — K625 Hemorrhage of anus and rectum: Secondary | ICD-10-CM

## 2012-11-13 DIAGNOSIS — E7800 Pure hypercholesterolemia, unspecified: Secondary | ICD-10-CM | POA: Diagnosis present

## 2012-11-13 DIAGNOSIS — K449 Diaphragmatic hernia without obstruction or gangrene: Secondary | ICD-10-CM | POA: Diagnosis present

## 2012-11-13 DIAGNOSIS — I10 Essential (primary) hypertension: Secondary | ICD-10-CM | POA: Diagnosis present

## 2012-11-13 DIAGNOSIS — K5790 Diverticulosis of intestine, part unspecified, without perforation or abscess without bleeding: Secondary | ICD-10-CM | POA: Diagnosis present

## 2012-11-13 DIAGNOSIS — K5731 Diverticulosis of large intestine without perforation or abscess with bleeding: Principal | ICD-10-CM | POA: Diagnosis present

## 2012-11-13 DIAGNOSIS — K922 Gastrointestinal hemorrhage, unspecified: Secondary | ICD-10-CM

## 2012-11-13 DIAGNOSIS — K5730 Diverticulosis of large intestine without perforation or abscess without bleeding: Secondary | ICD-10-CM

## 2012-11-13 DIAGNOSIS — J209 Acute bronchitis, unspecified: Secondary | ICD-10-CM | POA: Diagnosis present

## 2012-11-13 DIAGNOSIS — D500 Iron deficiency anemia secondary to blood loss (chronic): Secondary | ICD-10-CM | POA: Diagnosis present

## 2012-11-13 DIAGNOSIS — Z79899 Other long term (current) drug therapy: Secondary | ICD-10-CM

## 2012-11-13 DIAGNOSIS — Z8719 Personal history of other diseases of the digestive system: Secondary | ICD-10-CM

## 2012-11-13 DIAGNOSIS — D62 Acute posthemorrhagic anemia: Secondary | ICD-10-CM | POA: Diagnosis present

## 2012-11-13 DIAGNOSIS — Z23 Encounter for immunization: Secondary | ICD-10-CM

## 2012-11-13 DIAGNOSIS — Z7982 Long term (current) use of aspirin: Secondary | ICD-10-CM

## 2012-11-13 HISTORY — DX: Gastro-esophageal reflux disease without esophagitis: K21.9

## 2012-11-13 HISTORY — DX: Hemorrhage of anus and rectum: K62.5

## 2012-11-13 HISTORY — DX: Pure hypercholesterolemia, unspecified: E78.00

## 2012-11-13 LAB — CBC
HCT: 31.4 % — ABNORMAL LOW (ref 36.0–46.0)
Hemoglobin: 10.3 g/dL — ABNORMAL LOW (ref 12.0–15.0)
MCH: 29.9 pg (ref 26.0–34.0)
MCHC: 32.8 g/dL (ref 30.0–36.0)
MCV: 91 fL (ref 78.0–100.0)
Platelets: 266 10*3/uL (ref 150–400)
RBC: 3.45 MIL/uL — ABNORMAL LOW (ref 3.87–5.11)
RDW: 15 % (ref 11.5–15.5)
WBC: 5.3 10*3/uL (ref 4.0–10.5)

## 2012-11-13 LAB — COMPREHENSIVE METABOLIC PANEL
ALT: 7 U/L (ref 0–35)
AST: 17 U/L (ref 0–37)
Albumin: 3.6 g/dL (ref 3.5–5.2)
Alkaline Phosphatase: 67 U/L (ref 39–117)
BUN: 21 mg/dL (ref 6–23)
CO2: 26 mEq/L (ref 19–32)
Calcium: 9.4 mg/dL (ref 8.4–10.5)
Chloride: 104 mEq/L (ref 96–112)
Creatinine, Ser: 1.23 mg/dL — ABNORMAL HIGH (ref 0.50–1.10)
GFR calc Af Amer: 45 mL/min — ABNORMAL LOW (ref >90)
GFR calc non Af Amer: 39 mL/min — ABNORMAL LOW (ref >90)
Glucose, Bld: 107 mg/dL — ABNORMAL HIGH (ref 70–99)
Potassium: 4.5 mEq/L (ref 3.5–5.1)
Sodium: 139 mEq/L (ref 135–145)
Total Bilirubin: 0.3 mg/dL (ref 0.3–1.2)
Total Protein: 7.1 g/dL (ref 6.0–8.3)

## 2012-11-13 LAB — PROTIME-INR
INR: 1.02 (ref 0.00–1.49)
Prothrombin Time: 13.2 seconds (ref 11.6–15.2)

## 2012-11-13 MED ORDER — IOHEXOL 300 MG/ML  SOLN
80.0000 mL | Freq: Once | INTRAMUSCULAR | Status: AC | PRN
  Administered 2012-11-13: 80 mL via INTRAVENOUS

## 2012-11-13 MED ORDER — SODIUM CHLORIDE 0.9 % IJ SOLN
3.0000 mL | Freq: Two times a day (BID) | INTRAMUSCULAR | Status: DC
  Administered 2012-11-14 – 2012-11-16 (×4): 3 mL via INTRAVENOUS

## 2012-11-13 MED ORDER — METOPROLOL TARTRATE 25 MG PO TABS
25.0000 mg | ORAL_TABLET | Freq: Two times a day (BID) | ORAL | Status: DC
  Administered 2012-11-13 – 2012-11-14 (×2): 25 mg via ORAL
  Filled 2012-11-13 (×3): qty 1

## 2012-11-13 MED ORDER — HYDROCODONE-ACETAMINOPHEN 5-325 MG PO TABS
1.0000 | ORAL_TABLET | Freq: Four times a day (QID) | ORAL | Status: DC | PRN
  Administered 2012-11-14 – 2012-11-16 (×3): 1 via ORAL
  Filled 2012-11-13 (×3): qty 1

## 2012-11-13 MED ORDER — ONDANSETRON HCL 4 MG PO TABS: 4.0000 mg | ORAL_TABLET | Freq: Four times a day (QID) | ORAL | Status: DC | PRN

## 2012-11-13 MED ORDER — NITROGLYCERIN 0.4 MG SL SUBL: 0.4000 mg | SUBLINGUAL_TABLET | SUBLINGUAL | Status: DC | PRN

## 2012-11-13 MED ORDER — IOHEXOL 300 MG/ML  SOLN
25.0000 mL | INTRAMUSCULAR | Status: DC
  Administered 2012-11-13: 25 mL via ORAL

## 2012-11-13 MED ORDER — SODIUM CHLORIDE 0.9 % IV SOLN
250.0000 mL | INTRAVENOUS | Status: DC | PRN
  Administered 2012-11-13: 250 mL via INTRAVENOUS

## 2012-11-13 MED ORDER — PANTOPRAZOLE SODIUM 40 MG IV SOLR
40.0000 mg | Freq: Two times a day (BID) | INTRAVENOUS | Status: DC
  Administered 2012-11-13 – 2012-11-16 (×7): 40 mg via INTRAVENOUS
  Filled 2012-11-13 (×9): qty 40

## 2012-11-13 MED ORDER — ONDANSETRON HCL 4 MG/2ML IJ SOLN
4.0000 mg | Freq: Four times a day (QID) | INTRAMUSCULAR | Status: DC | PRN
  Administered 2012-11-14: 4 mg via INTRAVENOUS
  Filled 2012-11-13: qty 2

## 2012-11-13 MED ORDER — SODIUM CHLORIDE 0.9 % IJ SOLN
3.0000 mL | INTRAMUSCULAR | Status: DC | PRN
  Administered 2012-11-14 – 2012-11-16 (×2): 3 mL via INTRAVENOUS

## 2012-11-13 MED ORDER — INFLUENZA VAC SPLIT QUAD 0.5 ML IM SUSP
0.5000 mL | INTRAMUSCULAR | Status: AC
  Administered 2012-11-14: 0.5 mL via INTRAMUSCULAR
  Filled 2012-11-13: qty 0.5

## 2012-11-13 NOTE — Consult Note (Signed)
Referring Provider: Dr. Penny Pia ([facility]) Primary Care Physician:  No primary provider on file. Primary Gastroenterologist:  Dr. Carman Ching  Reason for Consultation:  GI bleeding  HPI: [name] is a 79 y.o. female with a history of recurrent diverticular hemorrhage, most recently in January of this year, never requiring transfusion. She has had several such episodes in recent years.   She has documented severe, extensive, pancolonic diverticulosis by multiple previous colonoscopies. Her most recent attempted colonoscopy, in March of 2012, was unsuccessful due to severe sigmoid stricturing associated with her diverticular disease, but she had colonoscopy in April of 2008 and again in June of 2010, negative for any polyps or definite vascular ectasia, but positive only for her extensive diverticulosis and a large, pedunculated lipomatous lesion (biopsies negative in 2008) in the region of the ileal cecal valve.  Of note, the patient has been repeatedly Hemoccult negative in the past, including following her most recent bleed in January of this year.  With that background, the patient had 2 bloody bowel movements last night, initially somewhat darker in color, but brighter red when she had several additional episodes this morning. She has had, in addition, a couple of bouts of since coming into the hospital, most recently several hours ago. She has not had any associated syncope or significant abdominal pain, just some low-grade abdominal "rumbling" prior to her bowel movements.  The patient does take a daily 81 mg aspirin, uses small tear in gel a fair amount for hip pain, and has a known large hiatal hernia.   Her admission hemoglobin was 10.3.   Past Medical History  Diagnosis Date  . Diverticulitis   . History of GI diverticular bleed   . HTN (hypertension)   . High cholesterol   . Acid reflux     History reviewed. No pertinent past surgical history.  Prior to  Admission medications   Medication Sig Start Date End Date Taking? Authorizing Provider  acetaminophen (TYLENOL) 325 MG tablet Take 650 mg by mouth every 6 (six) hours as needed. pain   Yes Historical Provider, MD  aspirin EC 81 MG tablet Take 81 mg by mouth daily.   Yes Historical Provider, MD  cholecalciferol (VITAMIN D) 1000 UNITS tablet Take 2,000 Units by mouth daily.   Yes Historical Provider, MD  ciprofloxacin-dexamethasone (CIPRODEX) otic suspension Place 2 drops into both ears daily as needed. For ear dryness and discomfort   Yes Historical Provider, MD  esomeprazole (NEXIUM) 40 MG capsule Take 40 mg by mouth daily before breakfast.   Yes Historical Provider, MD  ferrous sulfate 325 (65 FE) MG tablet Take 325 mg by mouth daily with breakfast.   Yes Historical Provider, MD  HYDROcodone-acetaminophen (VICODIN) 5-500 MG per tablet Take 1 tablet by mouth daily as needed. pain   Yes Historical Provider, MD  losartan-hydrochlorothiazide (HYZAAR) 100-12.5 MG per tablet Take 1 tablet by mouth daily.   Yes Historical Provider, MD  metoprolol tartrate (LOPRESSOR) 25 MG tablet Take 25 mg by mouth 2 (two) times daily.   Yes Historical Provider, MD  Multiple Vitamin (MULITIVITAMIN WITH MINERALS) TABS Take 1 tablet by mouth daily.   Yes Historical Provider, MD  nitroGLYCERIN (NITROSTAT) 0.4 MG SL tablet Place 0.4 mg under the tongue every 5 (five) minutes as needed.   Yes Historical Provider, MD  Olopatadine HCl (PATADAY OP) Apply 1 drop to eye daily as needed. For allergy dryness   Yes Historical Provider, MD  polyethylene glycol (MIRALAX / GLYCOLAX)  packet Take 17 g by mouth daily as needed. constipation   Yes Historical Provider, MD  potassium chloride (K-DUR) 10 MEQ tablet Take 10 mEq by mouth daily.   Yes Historical Provider, MD    Current Facility-Administered Medications  Medication Dose Route Frequency Provider Last Rate Last Dose  . 0.9 %  sodium chloride infusion  250 mL Intravenous PRN  Penny Pia, MD 10 mL/hr at 11/13/12 1556 250 mL at 11/13/12 1556  . HYDROcodone-acetaminophen (NORCO/VICODIN) 5-325 MG per tablet 1 tablet  1 tablet Oral Q6H PRN Penny Pia, MD      . Melene Muller ON 11/14/2012] influenza vac split quadrivalent PF (FLUARIX) injection 0.5 mL  0.5 mL Intramuscular Tomorrow-1000 Penny Pia, MD      . metoprolol tartrate (LOPRESSOR) tablet 25 mg  25 mg Oral BID Penny Pia, MD      . nitroGLYCERIN (NITROSTAT) SL tablet 0.4 mg  0.4 mg Sublingual Q5 min PRN Penny Pia, MD      . ondansetron (ZOFRAN) tablet 4 mg  4 mg Oral Q6H PRN Penny Pia, MD       Or  . ondansetron (ZOFRAN) injection 4 mg  4 mg Intravenous Q6H PRN Penny Pia, MD      . pantoprazole (PROTONIX) injection 40 mg  40 mg Intravenous Q12H Penny Pia, MD   40 mg at 11/13/12 1723  . sodium chloride 0.9 % injection 3 mL  3 mL Intravenous Q12H Penny Pia, MD      . sodium chloride 0.9 % injection 3 mL  3 mL Intravenous PRN Penny Pia, MD        Allergies as of 11/13/2012 - Review Complete 11/13/2012  Allergen Reaction Noted  . Ezetimibe Anaphylaxis 02/23/2012  . Cholestyramine Nausea And Vomiting 02/23/2012  . Lipitor [atorvastatin] Swelling 02/23/2012  . Pravachol [pravastatin] Swelling 02/23/2012  . Statins Swelling 02/23/2012  . Zocor [simvastatin] Swelling 02/23/2012    History reviewed. No pertinent family history.  History   Social History  . Marital Status: Widowed    Spouse Name: N/A    Number of Children: N/A  . Years of Education: N/A   Occupational History  . Not on file.   Social History Main Topics  . Smoking status: Never Smoker   . Smokeless tobacco: Never Used  . Alcohol Use: No  . Drug Use: No  . Sexual Activity: Not Currently   Other Topics Concern  . Not on file   Social History Narrative  . No narrative on file    Review of Systems: The patient denies constipation, using MiraLAX several times a week to help keep soft stools. No problem with ongoing  upper tract symptoms. .  Physical Exam: Vital signs in last 24 hours: Temp:  [97.7 F (36.5 C)-98.5 F (36.9 C)] 97.7 F (36.5 C) (10/02 1539) Pulse Rate:  [52-92] 65 (10/02 1539) Resp:  [18] 18 (10/02 1539) BP: (122-163)/(51-84) 163/77 mmHg (10/02 1539) SpO2:  [91 %-100 %] 100 % (10/02 1539) Weight:  [96.163 kg (212 lb)-96.5 kg (212 lb 11.9 oz)] 96.163 kg (212 lb) (10/02 1215) Last BM Date: 11/13/12 General:   Alert,  Well-developed, well preserved, well-nourished, pleasant and cooperative African American female in NAD Head:  Normocephalic and atraumatic. Eyes:  Sclera clear, no icterus.   Conjunctiva pink. Mouth:   No ulcerations or lesions.  Oropharynx pink & moist. Lungs:  Clear throughout to auscultation.   No wheezes, crackles, or rhonchi. No evident respiratory distress. Heart:   Regular rate and  rhythm; no murmurs, clicks, rubs,  or gallops. Abdomen:  Soft, nontender, nontympanitic, and nondistended. No masses, hepatosplenomegaly or ventral hernias noted. Normal bowel sounds, without bruits, guarding, or rebound.  Mild tympany present. Msk:   Symmetrical without gross deformities. Pulses:  Normal radial pulse noted. Extremities:   Without clubbing, cyanosis, or edema. Neurologic:  Alert and coherent;  grossly normal neurologically. Skin:  Intact without significant lesions or rashes. The skin is slightly cool but not clammy. Psych:   Alert and cooperative. Normal mood and affect.  Intake/Output from previous day:   Intake/Output this shift:    Lab Results:  Recent Labs  11/13/12 1230  WBC 5.3  HGB 10.3*  HCT 31.4*  PLT 266   BMET  Recent Labs  11/13/12 1230  NA 139  K 4.5  CL 104  CO2 26  GLUCOSE 107*  BUN 21  CREATININE 1.23*  CALCIUM 9.4   LFT  Recent Labs  11/13/12 1230  PROT 7.1  ALBUMIN 3.6  AST 17  ALT 7  ALKPHOS 67  BILITOT 0.3   PT/INR  Recent Labs  11/13/12 1220  LABPROT 13.2  INR 1.02     Studies/Results: Ct Abdomen  Pelvis W Contrast  11/13/2012   CLINICAL DATA:  79 year old female with blood in stool. Rectal bleeding and left lower quadrant pain. Initial encounter.  EXAM: CT ABDOMEN AND PELVIS WITH CONTRAST  TECHNIQUE: Multidetector CT imaging of the abdomen and pelvis was performed using the standard protocol following bolus administration of intravenous contrast.  CONTRAST:  80mL OMNIPAQUE IOHEXOL 300 MG/ML  SOLN  COMPARISON:  CT Abdomen and Pelvis 12/03/2011.  FINDINGS: Chronic moderate to large retrocardiac gastric hernia not significantly changed. No pericardial or pleural effusion. Negative lung bases.  Diffuse degenerative disc and endplate changes in the visible spine. No acute osseous abnormality identified. advanced degenerative changes at the left hip.  Mildly distended but otherwise negative bladder. No pelvic free fluid. Rectum within normal limits. Uterus surgically absent.  Severe diverticulosis of the sigmoid colon which also is redundant. No active inflammation. Severe diverticulosis throughout the descending and transverse colon segments. Severe diverticulosis continues to the cecum. No focal colonic inflammation.  Mildly if loculated material in the terminal ileum. There also appear to be several distal ileum diverticula, unchanged (series 2, image 46, 48, 54). Oral contrast has just reached the distal small bowel. No dilated small bowel. Negative intra abdominal stomach. Negative duodenum.  Gallbladder surgically absent. Stable liver with small subcapsular cyst in the left lobe. Negative spleen, pancreas, and portal venous system. Stable adrenal hyperplasia. Stable kidneys with multiple small low-density areas compatible with simple cysts and nonobstructing right nephrolithiasis versus is calcified atherosclerosis.  No abdominal free fluid. Major arterial structures in the abdomen and pelvis are patent despite widespread atherosclerosis.  IMPRESSION: 1. Diffuse severe colonic diverticulosis. No active  inflammation to suggest diverticulitis, but consider diverticular bleeding as a source of lower GI blood.  2. Otherwise stable abdomen and pelvis.   Electronically Signed   By: Augusto Gamble M.D.   On: 11/13/2012 15:08    Impression: 1. Hematochezia, most likely of diverticular origin. Based on previous evaluation, neoplasia or vascular ectasia are very unlikely. Other possibilities would include ulceration of her lipoma or proximal colonic mucosal ulcerations from exposure to her Voltaren gel, but I think these are also much less likely than diverticular bleeding. 2. I doubt that this patient has an upper tract source of bleeding, despite her large hiatal hernia and history of  exposure to aspirin and Voltaren gel, and consideration of her normal BUN on admission, and her hemodynamic stability despite hematochezia. 3. Posthemorrhagic anemia  Plan: 1. Observation, with transfusion support as necessary. Diverticular bleeding, as well as most of her alternative potential etiologies, would tend to be self-limited.  2. I do not feel that endoscopic evaluation to rule out an upper tract source as needed, see above discussion.  3. Most recently, approximately a year ago, this patient could not be colonoscoped even with the pediatric colonoscope, because of her severe sigmoid stricturing. However, it is conceivable that she could be successfully colonoscoped by use of the new, ultraslim colonoscope but I do not feel that such a procedure would be necessary unless she has persistent, recurring, or destabilizing hemorrhage on this admission.   LOS: 0 days   Roye Gustafson V  11/13/2012, 7:23 PM

## 2012-11-13 NOTE — ED Provider Notes (Signed)
CSN: [id number]     Arrival date & time 11/13/12  0865 History   First MD Initiated Contact with Patient 11/13/12 1121     Chief Complaint  Patient presents with  . Rectal Bleeding    HPI  PCP Dr. Howie Ill.  GI DR. Edwards.  Elderly female for history of diverticulitis and diverticuli in several episodes of diverticular bleeding. Most recently in January she was admitted. Her bleeding stopped. She did not require a procedure for transfusions. Has been in her normal state of health until last night. She noted bright red blood per. Fairly frequently but small volume. She is not dizzy lightheaded syncopal or presyncopal. She is in chronic pain in her left back hip and left leg is unchanged. She states she has a little bit of pain in her left lower abdomen she is uncertain when this started. She denies fever slightly decreased appetite for a few days but no nausea or vomiting. shakes chills. She denies urinary symptoms.   Past Medical History  Diagnosis Date  . Diverticulitis   . History of GI diverticular bleed   . HTN (hypertension)   . High cholesterol   . Acid reflux    History reviewed. No pertinent past surgical history. History reviewed. No pertinent family history. History  Substance Use Topics  . Smoking status: Never Smoker   . Smokeless tobacco: Never Used  . Alcohol Use: No   OB History   Grav Para Term Preterm Abortions TAB SAB Ect Mult Living                 Review of Systems  Constitutional: Negative for fever, chills, diaphoresis, appetite change and fatigue.  HENT: Negative for sore throat, mouth sores and trouble swallowing.   Eyes: Negative for visual disturbance.  Respiratory: Negative for cough, chest tightness, shortness of breath and wheezing.   Cardiovascular: Negative for chest pain.  Gastrointestinal: Positive for abdominal pain, blood in stool and anal bleeding. Negative for nausea, vomiting, diarrhea, abdominal distention and rectal pain.  Endocrine:  Negative for polydipsia, polyphagia and polyuria.  Genitourinary: Negative for dysuria, frequency and hematuria.  Musculoskeletal: Positive for myalgias and back pain. Negative for gait problem.  Skin: Negative for color change, pallor and rash.  Neurological: Negative for dizziness, syncope, light-headedness and headaches.  Hematological: Does not bruise/bleed easily.  Psychiatric/Behavioral: Negative for behavioral problems and confusion.    Allergies  Ezetimibe; Cholestyramine; Lipitor; Pravachol; Statins; and Zocor  Home Medications   Current Outpatient Rx  Name  Route  Sig  Dispense  Refill  . acetaminophen (TYLENOL) 325 MG tablet   Oral   Take 650 mg by mouth every 6 (six) hours as needed. pain         . aspirin EC 81 MG tablet   Oral   Take 81 mg by mouth daily.         . cholecalciferol (VITAMIN D) 1000 UNITS tablet   Oral   Take 2,000 Units by mouth daily.         . ciprofloxacin-dexamethasone (CIPRODEX) otic suspension   Both Ears   Place 2 drops into both ears daily as needed. For ear dryness and discomfort         . esomeprazole (NEXIUM) 40 MG capsule   Oral   Take 40 mg by mouth daily before breakfast.         . ferrous sulfate 325 (65 FE) MG tablet   Oral   Take 325 mg by mouth daily  with breakfast.         . HYDROcodone-acetaminophen (VICODIN) 5-500 MG per tablet   Oral   Take 1 tablet by mouth daily as needed. pain         . losartan-hydrochlorothiazide (HYZAAR) 100-12.5 MG per tablet   Oral   Take 1 tablet by mouth daily.         . metoprolol tartrate (LOPRESSOR) 25 MG tablet   Oral   Take 25 mg by mouth 2 (two) times daily.         . Multiple Vitamin (MULITIVITAMIN WITH MINERALS) TABS   Oral   Take 1 tablet by mouth daily.         . nitroGLYCERIN (NITROSTAT) 0.4 MG SL tablet   Sublingual   Place 0.4 mg under the tongue every 5 (five) minutes as needed.         . Olopatadine HCl (PATADAY OP)   Ophthalmic   Apply 1  drop to eye daily as needed. For allergy dryness         . polyethylene glycol (MIRALAX / GLYCOLAX) packet   Oral   Take 17 g by mouth daily as needed. constipation         . potassium chloride (K-DUR) 10 MEQ tablet   Oral   Take 10 mEq by mouth daily.          BP 134/84  Pulse 67  Temp(Src) 98.5 F (36.9 C) (Oral)  Resp 18  Ht 5\' 5"  (1.651 m)  Wt 212 lb (96.163 kg)  BMI 35.28 kg/m2  SpO2 100% Physical Exam  Constitutional: She is oriented to person, place, and time. She appears well-developed and well-nourished. No distress.  HENT:  Head: Normocephalic.  Eyes: Conjunctivae are normal. Pupils are equal, round, and reactive to light. No scleral icterus.  Conjunctiva are not pale  Neck: Normal range of motion. Neck supple. No thyromegaly present.  Cardiovascular: Normal rate and regular rhythm.  Exam reveals no gallop and no friction rub.   No murmur heard. Pulmonary/Chest: Effort normal and breath sounds normal. No respiratory distress. She has no wheezes. She has no rales.  Abdominal: Soft. Bowel sounds are normal. She exhibits no distension. There is no tenderness. There is no rebound.  Minimal left lower quadrant tenderness no rebound.  Musculoskeletal: Normal range of motion.  Neurological: She is alert and oriented to person, place, and time.  Skin: Skin is warm and dry. No rash noted.  Nailbeds are not pale  Psychiatric: She has a normal mood and affect. Her behavior is normal.    ED Course  Procedures (including critical care time) Labs Review Labs Reviewed  CBC - Abnormal; Notable for the following:    RBC 3.45 (*)    Hemoglobin 10.3 (*)    HCT 31.4 (*)    All other components within normal limits  COMPREHENSIVE METABOLIC PANEL - Abnormal; Notable for the following:    Glucose, Bld 107 (*)    Creatinine, Ser 1.23 (*)    GFR calc non Af Amer 39 (*)    GFR calc Af Amer 45 (*)    All other components within normal limits  PROTIME-INR  TYPE AND SCREEN    Imaging Review No results found.  MDM   1. GI bleed    Hemoglobins at 10.3 which is her baseline. She is not hypotensive or tachycardic. Discussed this with Dr. Cena Benton try hospitalist. He is agreeable to admission for serial hemoglobins. She's done well and has not  required transfusions procedures for her episodes of bleeding. Hopefully this will be the case this admission as well. She is stable here    Roney Marion, MD 11/13/12 1404

## 2012-11-13 NOTE — ED Notes (Signed)
Pt reports large amounts of dark red rectal bleeding that started last night and continues this morning, reports stomach feels sick and uneasy but no pain or n/v. Reports hx of same that resolved itself.

## 2012-11-13 NOTE — ED Notes (Signed)
Pt reports she has been "pooping blood" that started last night, and has brought a sample in a cup with her today.

## 2012-11-13 NOTE — ED Notes (Signed)
Pt is finished drinking her contrast. CT staff Britta Mccreedy made aware.

## 2012-11-13 NOTE — H&P (Signed)
[facility] History and Physical  AUNESTI PELLEGRINO ZOX:[id number] DOB: 02/06/1924 DOA: 11/13/2012  Referring physician: Dr. Fayrene Fearing PCP: No primary provider on file.  Specialists: Dr. Ross Ludwig  Chief Complaint: Saw blood in BM  HPI: [name] is a 79 y.o. female  With h/o diverticulosis and prior admission for lower GI bleeding who presents to the hospital complaining of painless bloody BMs for the past 2 days.  Nothing she is aware of has made it better or worse.  She has had this in the past and the bleeding has resolved spontaneously before.  She denies any nausea, fever, or chills.  States that she takes aspirin daily.  Reports noticing red blood from her rectum with bowel movement.  In the ED patient was evaluated and found to have a hgb of 10.3.  Given her presenting complaint we were consulted for further evaluation and recommendations regarding her painless GI bleed  Review of Systems: 10 point review of system reviewed   Past Medical History  Diagnosis Date  . Diverticulitis   . History of GI diverticular bleed   . HTN (hypertension)   . High cholesterol   . Acid reflux    History reviewed. No pertinent past surgical history. Social History:  reports that she has never smoked. She has never used smokeless tobacco. She reports that she does not drink alcohol or use illicit drugs. Pt lives at home  Can patient participate in ADLs? yes  Allergies  Allergen Reactions  . Ezetimibe Anaphylaxis  . Cholestyramine Nausea And Vomiting  . Lipitor [Atorvastatin] Swelling  . Pravachol [Pravastatin] Swelling  . Statins Swelling    Swelling of mouth and lips  . Zocor [Simvastatin] Swelling    History reviewed. No pertinent family history. None other reported  Prior to Admission medications   Medication Sig Start Date End Date Taking? Authorizing Provider  acetaminophen (TYLENOL) 325 MG tablet Take 650 mg by mouth every 6 (six) hours as needed. pain   Yes Historical  Provider, MD  aspirin EC 81 MG tablet Take 81 mg by mouth daily.   Yes Historical Provider, MD  cholecalciferol (VITAMIN D) 1000 UNITS tablet Take 2,000 Units by mouth daily.   Yes Historical Provider, MD  ciprofloxacin-dexamethasone (CIPRODEX) otic suspension Place 2 drops into both ears daily as needed. For ear dryness and discomfort   Yes Historical Provider, MD  esomeprazole (NEXIUM) 40 MG capsule Take 40 mg by mouth daily before breakfast.   Yes Historical Provider, MD  ferrous sulfate 325 (65 FE) MG tablet Take 325 mg by mouth daily with breakfast.   Yes Historical Provider, MD  HYDROcodone-acetaminophen (VICODIN) 5-500 MG per tablet Take 1 tablet by mouth daily as needed. pain   Yes Historical Provider, MD  losartan-hydrochlorothiazide (HYZAAR) 100-12.5 MG per tablet Take 1 tablet by mouth daily.   Yes Historical Provider, MD  metoprolol tartrate (LOPRESSOR) 25 MG tablet Take 25 mg by mouth 2 (two) times daily.   Yes Historical Provider, MD  Multiple Vitamin (MULITIVITAMIN WITH MINERALS) TABS Take 1 tablet by mouth daily.   Yes Historical Provider, MD  nitroGLYCERIN (NITROSTAT) 0.4 MG SL tablet Place 0.4 mg under the tongue every 5 (five) minutes as needed.   Yes Historical Provider, MD  Olopatadine HCl (PATADAY OP) Apply 1 drop to eye daily as needed. For allergy dryness   Yes Historical Provider, MD  polyethylene glycol (MIRALAX / GLYCOLAX) packet Take 17 g by mouth daily as needed. constipation   Yes Historical Provider,  MD  potassium chloride (K-DUR) 10 MEQ tablet Take 10 mEq by mouth daily.   Yes Historical Provider, MD   Physical Exam: Filed Vitals:   11/13/12 1500  BP: 122/51  Pulse: 73  Temp:   Resp:      General:  Pt in NAD, Alert and awake  Eyes: EOMI, non icteric  ENT: normal exterior appearance, no masses on visual examination  Neck: supple, no goiter  Cardiovascular: RRR, no MRG  Respiratory: CTA BL, no wheezes  Abdomen: soft, NT, ND  Skin: warm and  dry  Musculoskeletal: no cyanosis or clubbing  Psychiatric: mood and affect appropriate  Neurologic: answers questions appropriately and moves all extremities equally  Labs on Admission:  Basic Metabolic Panel:  Recent Labs Lab 11/13/12 1230  NA 139  K 4.5  CL 104  CO2 26  GLUCOSE 107*  BUN 21  CREATININE 1.23*  CALCIUM 9.4   Liver Function Tests:  Recent Labs Lab 11/13/12 1230  AST 17  ALT 7  ALKPHOS 67  BILITOT 0.3  PROT 7.1  ALBUMIN 3.6   No results found for this basename: LIPASE, AMYLASE,  in the last 168 hours No results found for this basename: AMMONIA,  in the last 168 hours CBC:  Recent Labs Lab 11/13/12 1230  WBC 5.3  HGB 10.3*  HCT 31.4*  MCV 91.0  PLT 266   Cardiac Enzymes: No results found for this basename: CKTOTAL, CKMB, CKMBINDEX, TROPONINI,  in the last 168 hours  BNP (last 3 results) No results found for this basename: PROBNP,  in the last 8760 hours CBG: No results found for this basename: GLUCAP,  in the last 168 hours  Radiological Exams on Admission: Ct Abdomen Pelvis W Contrast  11/13/2012   CLINICAL DATA:  79 year old female with blood in stool. Rectal bleeding and left lower quadrant pain. Initial encounter.  EXAM: CT ABDOMEN AND PELVIS WITH CONTRAST  TECHNIQUE: Multidetector CT imaging of the abdomen and pelvis was performed using the standard protocol following bolus administration of intravenous contrast.  CONTRAST:  80mL OMNIPAQUE IOHEXOL 300 MG/ML  SOLN  COMPARISON:  CT Abdomen and Pelvis 12/03/2011.  FINDINGS: Chronic moderate to large retrocardiac gastric hernia not significantly changed. No pericardial or pleural effusion. Negative lung bases.  Diffuse degenerative disc and endplate changes in the visible spine. No acute osseous abnormality identified. advanced degenerative changes at the left hip.  Mildly distended but otherwise negative bladder. No pelvic free fluid. Rectum within normal limits. Uterus surgically absent.   Severe diverticulosis of the sigmoid colon which also is redundant. No active inflammation. Severe diverticulosis throughout the descending and transverse colon segments. Severe diverticulosis continues to the cecum. No focal colonic inflammation.  Mildly if loculated material in the terminal ileum. There also appear to be several distal ileum diverticula, unchanged (series 2, image 46, 48, 54). Oral contrast has just reached the distal small bowel. No dilated small bowel. Negative intra abdominal stomach. Negative duodenum.  Gallbladder surgically absent. Stable liver with small subcapsular cyst in the left lobe. Negative spleen, pancreas, and portal venous system. Stable adrenal hyperplasia. Stable kidneys with multiple small low-density areas compatible with simple cysts and nonobstructing right nephrolithiasis versus is calcified atherosclerosis.  No abdominal free fluid. Major arterial structures in the abdomen and pelvis are patent despite widespread atherosclerosis.  IMPRESSION: 1. Diffuse severe colonic diverticulosis. No active inflammation to suggest diverticulitis, but consider diverticular bleeding as a source of lower GI blood.  2. Otherwise stable abdomen and pelvis.  Electronically Signed   By: Augusto Gamble M.D.   On: 11/13/2012 15:08    Assessment/Plan Active Problems:   History of GI diverticular bleed   Diverticulosis   Hypertension   1. BRBPR - Most likely due to diverticular bleed given history - at this point will hold aspirin - check cbc daily. No active bleeding currently as such will check cbc next am. - Consulted GI (discussed with Dr. Matthias Hughs) - monitor on telemetry  2. HTN - plan on continuing metoprolol - continue to monitor blood pressures  3. DVT prophylaxis - SCD's   Code Status: full Family Communication: discussed with patient at bedside Disposition Plan: Pending cessation of bleeding and plans by specialist  Time spent: > 60 minutes  Penny Pia [facility] Pager [phone]  If 7PM-7AM, please contact night-coverage [email or web address] Password [facility] 11/13/2012, 3:18 PM

## 2012-11-14 DIAGNOSIS — I10 Essential (primary) hypertension: Secondary | ICD-10-CM

## 2012-11-14 DIAGNOSIS — Z8719 Personal history of other diseases of the digestive system: Secondary | ICD-10-CM

## 2012-11-14 DIAGNOSIS — K625 Hemorrhage of anus and rectum: Secondary | ICD-10-CM

## 2012-11-14 DIAGNOSIS — K922 Gastrointestinal hemorrhage, unspecified: Secondary | ICD-10-CM

## 2012-11-14 LAB — CBC
HCT: 22.6 % — ABNORMAL LOW (ref 36.0–46.0)
HCT: 24.3 % — ABNORMAL LOW (ref 36.0–46.0)
HCT: 30.2 % — ABNORMAL LOW (ref 36.0–46.0)
Hemoglobin: 7.8 g/dL — ABNORMAL LOW (ref 12.0–15.0)
Hemoglobin: 8.1 g/dL — ABNORMAL LOW (ref 12.0–15.0)
Hemoglobin: 10.2 g/dL — ABNORMAL LOW (ref 12.0–15.0)
MCH: 30.7 pg (ref 26.0–34.0)
MCH: 30 pg (ref 26.0–34.0)
MCH: 29.7 pg (ref 26.0–34.0)
MCHC: 34.5 g/dL (ref 30.0–36.0)
MCHC: 33.3 g/dL (ref 30.0–36.0)
MCHC: 33.8 g/dL (ref 30.0–36.0)
MCV: 89 fL (ref 78.0–100.0)
MCV: 90 fL (ref 78.0–100.0)
MCV: 87.8 fL (ref 78.0–100.0)
Platelets: 222 10*3/uL (ref 150–400)
Platelets: 207 10*3/uL (ref 150–400)
Platelets: 204 10*3/uL (ref 150–400)
RBC: 2.54 MIL/uL — ABNORMAL LOW (ref 3.87–5.11)
RBC: 2.7 MIL/uL — ABNORMAL LOW (ref 3.87–5.11)
RBC: 3.44 MIL/uL — ABNORMAL LOW (ref 3.87–5.11)
RDW: 14.8 % (ref 11.5–15.5)
RDW: 14.7 % (ref 11.5–15.5)
RDW: 14.7 % (ref 11.5–15.5)
WBC: 3.7 10*3/uL — ABNORMAL LOW (ref 4.0–10.5)
WBC: 3.4 10*3/uL — ABNORMAL LOW (ref 4.0–10.5)
WBC: 6 10*3/uL (ref 4.0–10.5)

## 2012-11-14 LAB — BASIC METABOLIC PANEL
BUN: 20 mg/dL (ref 6–23)
CO2: 26 mEq/L (ref 19–32)
Calcium: 8.7 mg/dL (ref 8.4–10.5)
Chloride: 104 mEq/L (ref 96–112)
Creatinine, Ser: 1.26 mg/dL — ABNORMAL HIGH (ref 0.50–1.10)
GFR calc Af Amer: 43 mL/min — ABNORMAL LOW (ref >90)
GFR calc non Af Amer: 37 mL/min — ABNORMAL LOW (ref >90)
Glucose, Bld: 92 mg/dL (ref 70–99)
Potassium: 4.2 mEq/L (ref 3.5–5.1)
Sodium: 138 mEq/L (ref 135–145)

## 2012-11-14 LAB — PREPARE RBC (CROSSMATCH)

## 2012-11-14 LAB — OCCULT BLOOD X 1 CARD TO LAB, STOOL: Fecal Occult Bld: POSITIVE — AB

## 2012-11-14 MED ORDER — FUROSEMIDE 10 MG/ML IJ SOLN
20.0000 mg | Freq: Once | INTRAMUSCULAR | Status: AC
  Administered 2012-11-14: 20 mg via INTRAVENOUS

## 2012-11-14 MED ORDER — FUROSEMIDE 10 MG/ML IJ SOLN
INTRAMUSCULAR | Status: AC
  Filled 2012-11-14: qty 4

## 2012-11-14 MED ORDER — SODIUM CHLORIDE 0.9 % IV SOLN
INTRAVENOUS | Status: AC
  Administered 2012-11-14: 21:00:00 via INTRAVENOUS

## 2012-11-14 MED ORDER — MENTHOL 3 MG MT LOZG
1.0000 | LOZENGE | OROMUCOSAL | Status: DC | PRN
  Administered 2012-11-14: 3 mg via ORAL
  Filled 2012-11-14 (×2): qty 9

## 2012-11-14 MED ORDER — BENZONATATE 100 MG PO CAPS
100.0000 mg | ORAL_CAPSULE | Freq: Three times a day (TID) | ORAL | Status: DC | PRN
  Administered 2012-11-15 – 2012-11-16 (×2): 100 mg via ORAL
  Filled 2012-11-14 (×4): qty 1

## 2012-11-14 MED ORDER — PHENOL 1.4 % MT LIQD
1.0000 | OROMUCOSAL | Status: DC | PRN
  Administered 2012-11-14: 1 via OROMUCOSAL
  Filled 2012-11-14 (×2): qty 177

## 2012-11-14 NOTE — Progress Notes (Addendum)
[facility] PROGRESS NOTE  [name] WUJ:[id number] DOB: 1923/07/05 DOA: 11/13/2012 PCP: No primary provider on file.  HPI/Subjective: 79 yo AAF with pertinent history of recurrent diverticular hemorrhage, never requiring transfusion, with the most recent episode in January 2014.  She also has history of HTN, large hiatal hernia with GERD, and hyperlipidemia.  Presented to [facility] 11/14/12 due to multiple episodes of BRBPR over the last two days.  ED labs revealed iron-def anemia and CT abdomen showed severe pancolonic diverticulosis without inflammation, thus she was admitted for observation.  She has no complaints today of pain, nausea, or vomiting.  She reports continuation of bloody bowel movements with the most recent one being at 0600 on 11/14/12.  Assessment/Plan:  Hematochezia/Diverticulosis: - Secondary to severe diverticular disease noted on CT - D/C ASA until bleeding subsides - IVF - GI consult appreciated (Dr. Matthias Hughs)- agree with observation and attempt colonoscopy if bleeding persists or if she becomes hemodynamically unstable. - Transfuse 2 units PRBC 11/14/12; HgB decreased to 7.8 from 10.3 on admission - Will continue to monitor CBC and transfuse as necessary  Hypertension: - Stable - hold home med Metoprolol for now.  Will resume when BP is stronger. - Will continue to monitor  Hiatal Hernia/GERD: - Stable - IV Protonix 40mg  q 12h - Appreciate GI consult and agree - Will continue to monitor;  EGD unlikely, but may be necessary if signs of upper GI bleed present due to ASA and Voltaren gel exposures.  DVT Prophylaxis: - GI Bleed - SCDs as can tolerate  Code Status: Full Family Communication: None at bedside  Disposition Plan: Remain inpatient   Consultants:  GI  Procedures:  None  Antibiotics:  None    Objective: Filed Vitals:   11/14/12 0944  BP: 114/49  Pulse: 60  Temp: 98.6 F (37 C)  Resp: 16   No intake or output data in the 24  hours ending 11/14/12 1039 Filed Weights   11/13/12 0958 11/13/12 1215  Weight: 96.5 kg (212 lb 11.9 oz) 96.163 kg (212 lb)    Exam:   General:  Resting comfortably in bed and in no apparent distress  Cardiovascular: RRR, No murmurs, gallops, or rubs. Distal pulses intact and symmetrical.  Cap refill < 3 sec.  Respiratory: Clear to auscultation bilaterally.  No rales, rhonchi, or wheezes.  Abdomen: Soft, non-tender.  No masses or hernias felt.  No organomegaly.  Bowel sounds heard in all 4 quadrants.  Musculoskeletal: Full ROM.  Strength 5/5 all extremities.    Psychiatric: Alert and oriented x 3.  Good affect.  Responds appropriately.  Smiling, very cooperative.   Data Reviewed: Basic Metabolic Panel:  Recent Labs Lab 11/13/12 1230 11/14/12 0451  NA 139 138  K 4.5 4.2  CL 104 104  CO2 26 26  GLUCOSE 107* 92  BUN 21 20  CREATININE 1.23* 1.26*  CALCIUM 9.4 8.7   Liver Function Tests:  Recent Labs Lab 11/13/12 1230  AST 17  ALT 7  ALKPHOS 67  BILITOT 0.3  PROT 7.1  ALBUMIN 3.6   CBC:  Recent Labs Lab 11/13/12 1230 11/14/12 0451 11/14/12 0809  WBC 5.3 3.7* 3.4*  HGB 10.3* 7.8* 8.1*  HCT 31.4* 22.6* 24.3*  MCV 91.0 89.0 90.0  PLT 266 222 207     Studies: Ct Abdomen Pelvis W Contrast  11/13/2012   CLINICAL DATA:  79 year old female with blood in stool. Rectal bleeding and left lower quadrant pain. Initial encounter.  EXAM: CT ABDOMEN AND  PELVIS WITH CONTRAST  TECHNIQUE: Multidetector CT imaging of the abdomen and pelvis was performed using the standard protocol following bolus administration of intravenous contrast.  CONTRAST:  80mL OMNIPAQUE IOHEXOL 300 MG/ML  SOLN  COMPARISON:  CT Abdomen and Pelvis 12/03/2011.  FINDINGS: Chronic moderate to large retrocardiac gastric hernia not significantly changed. No pericardial or pleural effusion. Negative lung bases.  Diffuse degenerative disc and endplate changes in the visible spine. No acute osseous abnormality  identified. advanced degenerative changes at the left hip.  Mildly distended but otherwise negative bladder. No pelvic free fluid. Rectum within normal limits. Uterus surgically absent.  Severe diverticulosis of the sigmoid colon which also is redundant. No active inflammation. Severe diverticulosis throughout the descending and transverse colon segments. Severe diverticulosis continues to the cecum. No focal colonic inflammation.  Mildly if loculated material in the terminal ileum. There also appear to be several distal ileum diverticula, unchanged (series 2, image 46, 48, 54). Oral contrast has just reached the distal small bowel. No dilated small bowel. Negative intra abdominal stomach. Negative duodenum.  Gallbladder surgically absent. Stable liver with small subcapsular cyst in the left lobe. Negative spleen, pancreas, and portal venous system. Stable adrenal hyperplasia. Stable kidneys with multiple small low-density areas compatible with simple cysts and nonobstructing right nephrolithiasis versus is calcified atherosclerosis.  No abdominal free fluid. Major arterial structures in the abdomen and pelvis are patent despite widespread atherosclerosis.  IMPRESSION: 1. Diffuse severe colonic diverticulosis. No active inflammation to suggest diverticulitis, but consider diverticular bleeding as a source of lower GI blood.  2. Otherwise stable abdomen and pelvis.   Electronically Signed   By: Augusto Gamble M.D.   On: 11/13/2012 15:08    Scheduled Meds: . influenza vac split quadrivalent PF  0.5 mL Intramuscular Tomorrow-1000  . metoprolol tartrate  25 mg Oral BID  . pantoprazole (PROTONIX) IV  40 mg Intravenous Q12H  . sodium chloride  3 mL Intravenous Q12H    Active Problems:   History of GI diverticular bleed   Diverticulosis   Hypertension   Bright red blood per rectum    Joycelyn Man, PA-S  Algis Downs PA-C [facility] Pager [phone]. If 7PM-7AM, please contact night-coverage at  [email or web address], password [facility] 11/14/2012, 10:39 AM  LOS: 1 day     Addendum  Patient seen and examined, chart and data base reviewed.  I agree with the above assessment and plan.  For full details please see Mrs. Algis Downs PA note.   Clint Lipps, MD [facility] Pager: [phone] 11/14/2012, 12:55 PM

## 2012-11-14 NOTE — Progress Notes (Signed)
UR COMPLETED  

## 2012-11-14 NOTE — Progress Notes (Signed)
It appears that the patient's bleeding is slowing down. Although she has had several bloody bowel movements over the past 12 hours, the amount seems to be becoming smaller. Her hemoglobin has leveled off at 8.1, but she is receiving a transfusion of a unit of packed cells at this time.  Please see my note from yesterday regarding differential diagnosis and recommendations. I still feel that we are doing the correct thing by observing and supporting, rather than doing diagnostic testing. However, we may need to adjust that strategy, if she continues to bleed.  Florencia Reasons, M.D. [phone]

## 2012-11-14 NOTE — Progress Notes (Signed)
Pt complaining of a cough, nausea , and throat pain. MD ordered tessalon PRN. Pt to be given something for pain and nausea. RN will continue to monitor pt. Blood transfusionis now complete.

## 2012-11-15 ENCOUNTER — Inpatient Hospital Stay (HOSPITAL_COMMUNITY): Payer: Medicare Other

## 2012-11-15 DIAGNOSIS — K5730 Diverticulosis of large intestine without perforation or abscess without bleeding: Secondary | ICD-10-CM

## 2012-11-15 DIAGNOSIS — J209 Acute bronchitis, unspecified: Secondary | ICD-10-CM

## 2012-11-15 DIAGNOSIS — Z8719 Personal history of other diseases of the digestive system: Secondary | ICD-10-CM

## 2012-11-15 DIAGNOSIS — K625 Hemorrhage of anus and rectum: Secondary | ICD-10-CM

## 2012-11-15 DIAGNOSIS — I10 Essential (primary) hypertension: Secondary | ICD-10-CM

## 2012-11-15 LAB — CBC
HCT: 30.8 % — ABNORMAL LOW (ref 36.0–46.0)
HCT: 31.1 % — ABNORMAL LOW (ref 36.0–46.0)
Hemoglobin: 10.6 g/dL — ABNORMAL LOW (ref 12.0–15.0)
Hemoglobin: 10.6 g/dL — ABNORMAL LOW (ref 12.0–15.0)
MCH: 30.5 pg (ref 26.0–34.0)
MCH: 30.2 pg (ref 26.0–34.0)
MCHC: 34.4 g/dL (ref 30.0–36.0)
MCHC: 34.1 g/dL (ref 30.0–36.0)
MCV: 88.8 fL (ref 78.0–100.0)
MCV: 88.6 fL (ref 78.0–100.0)
Platelets: 228 10*3/uL (ref 150–400)
Platelets: 235 10*3/uL (ref 150–400)
RBC: 3.47 MIL/uL — ABNORMAL LOW (ref 3.87–5.11)
RBC: 3.51 MIL/uL — ABNORMAL LOW (ref 3.87–5.11)
RDW: 15 % (ref 11.5–15.5)
RDW: 14.6 % (ref 11.5–15.5)
WBC: 8.7 10*3/uL (ref 4.0–10.5)
WBC: 9.8 10*3/uL (ref 4.0–10.5)

## 2012-11-15 LAB — TYPE AND SCREEN
ABO/RH(D): B POS
Antibody Screen: NEGATIVE
Unit division: 0
Unit division: 0

## 2012-11-15 LAB — PRO B NATRIURETIC PEPTIDE: Pro B Natriuretic peptide (BNP): 133.2 pg/mL (ref 0–450)

## 2012-11-15 MED ORDER — IOHEXOL 300 MG/ML  SOLN
64.0000 mL | Freq: Once | INTRAMUSCULAR | Status: AC | PRN
  Administered 2012-11-15: 64 mL via INTRAVENOUS

## 2012-11-15 NOTE — Progress Notes (Signed)
Patient ID: [name], female   DOB: [birth year], 79 y.o.   MRN: [id number] [facility] Gastroenterology Progress Note  [name] 79 y.o. 07/10/1923   Subjective: Reports last bloody stool was yesterday evening. Coughing a lot. Denies abdominal pain.  Objective: Vital signs in last 24 hours: Filed Vitals:   11/15/12 1421  BP: 115/69  Pulse: 67  Temp: 99.1 F (37.3 C)  Resp: 18    Physical Exam: Gen: alert, no acute distress Abd: soft, nontender, nondistended, +BS  Lab Results:  Recent Labs  11/13/12 1230 11/14/12 0451  NA 139 138  K 4.5 4.2  CL 104 104  CO2 26 26  GLUCOSE 107* 92  BUN 21 20  CREATININE 1.23* 1.26*  CALCIUM 9.4 8.7    Recent Labs  11/13/12 1230  AST 17  ALT 7  ALKPHOS 67  BILITOT 0.3  PROT 7.1  ALBUMIN 3.6    Recent Labs  11/14/12 2232 11/15/12 0800  WBC 6.0 8.7  HGB 10.2* 10.6*  HCT 30.2* 30.8*  MCV 87.8 88.8  PLT 204 228    Recent Labs  11/13/12 1220  LABPROT 13.2  INR 1.02      Assessment/Plan: Resolving diverticular bleed - Hgb stable from yesterday and s/p transfusion yesterday when Hgb was 8.1 (10.2). Continue supportive care. Cough per primary team and patient was about to go to get a chest CT when I saw her. Ok to advance diet as tolerated.   Dalesha Stanback C. 11/15/2012, 4:12 PM

## 2012-11-15 NOTE — Progress Notes (Signed)
[facility] PROGRESS NOTE  [name] WUJ:[id number] DOB: May 24, 1923 DOA: 11/13/2012 PCP: No primary provider on file.  HPI/Subjective: 79 yo AAF with pertinent history of recurrent diverticular hemorrhage, never requiring transfusion, with the most recent episode in January 2014.  She also has history of HTN, large hiatal hernia with GERD, and hyperlipidemia.  Presented to [facility] 11/14/12 due to multiple episodes of BRBPR over the last two days.  ED labs revealed iron-def anemia and CT abdomen showed severe pancolonic diverticulosis without inflammation, thus she was admitted for observation.  She has no complaints today of pain, nausea, or vomiting.  She reports continuation of bloody bowel movements with the most recent one being at 0600 on 11/14/12.  Assessment/Plan:  Hematochezia/Diverticulosis: - Secondary to severe diverticular disease noted on CT - D/C ASA until bleeding subsides - IVF - GI consult appreciated (Dr. Matthias Hughs)- agree with observation and attempt colonoscopy if bleeding persists or if she becomes hemodynamically unstable. - Transfuse 2 units PRBC 11/14/12; HgB decreased to 7.8 from 10.3 on admission - Will continue to monitor CBC and transfuse as necessary - Today the hb is 10.6  Cough - Patient says she has been coughing after she got blood transfusion - BNP is pending - CXR shows no edema but has a upper thoracic  radiographic density, and radiologist recommend to get CT Chest - Will get CT chest   Hypertension: - Stable - hold home med Metoprolol for now.  Will resume when BP is stronger. - Will continue to monitor  Hiatal Hernia/GERD: - Stable - IV Protonix 40mg  q 12h - Appreciate GI consult and agree - Will continue to monitor;  EGD unlikely, but may be necessary if signs of upper GI bleed present due to ASA and Voltaren gel exposures.  DVT Prophylaxis: - GI Bleed - SCDs as can tolerate  Code Status: Full Family Communication: None at bedside   Disposition Plan: Remain inpatient   Consultants:  GI  Procedures:  None  Antibiotics:  None    Objective: Filed Vitals:   11/15/12 0602  BP: 119/62  Pulse: 63  Temp: 98.9 F (37.2 C)  Resp: 22    Intake/Output Summary (Last 24 hours) at 11/15/12 1256 Last data filed at 11/15/12 0636  Gross per 24 hour  Intake 1067.5 ml  Output      0 ml  Net 1067.5 ml   Filed Weights   11/13/12 0958 11/13/12 1215  Weight: 96.5 kg (212 lb 11.9 oz) 96.163 kg (212 lb)    Exam:   General:  Patient starts coughing while talking  Cardiovascular: RRR  Respiratory: Clear to auscultation bilaterally.  No rales, rhonchi, or wheezes.  Abdomen: Soft, non-tender.  No masses No organomegaly  Musculoskeletal: Full ROM.  Strength 5/5 all extremities.      Data Reviewed: Basic Metabolic Panel:  Recent Labs Lab 11/13/12 1230 11/14/12 0451  NA 139 138  K 4.5 4.2  CL 104 104  CO2 26 26  GLUCOSE 107* 92  BUN 21 20  CREATININE 1.23* 1.26*  CALCIUM 9.4 8.7   Liver Function Tests:  Recent Labs Lab 11/13/12 1230  AST 17  ALT 7  ALKPHOS 67  BILITOT 0.3  PROT 7.1  ALBUMIN 3.6   CBC:  Recent Labs Lab 11/13/12 1230 11/14/12 0451 11/14/12 0809 11/14/12 2232 11/15/12 0800  WBC 5.3 3.7* 3.4* 6.0 8.7  HGB 10.3* 7.8* 8.1* 10.2* 10.6*  HCT 31.4* 22.6* 24.3* 30.2* 30.8*  MCV 91.0 89.0 90.0 87.8 88.8  PLT 266 222 207 204 228     Studies: Dg Chest 2 View  11/15/2012   CLINICAL DATA:  Cough  EXAM: CHEST  2 VIEW  COMPARISON:  Chest radiograph 02/23/2012  FINDINGS: Stable enlarged cardiac silhouette. No effusion, infiltrate, or pneumothorax. Moderate hiatal hernia noted. Ectatic aorta.  There is extra density over the upper mediastinum above the aorta. Additionally, there is small gas collection left adjacent to the trachea at the level of the thoracic inlet corresponds to a probable Zenker's diverticulum seen on CT of 01/05/2009. Marland Kitchen  IMPRESSION: Extra density in upper  mediastinum. This may be projectional. Recommend attention on followup radiographs or CT thorax.  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   Electronically Signed   By: Genevive Bi M.D.   On: 11/15/2012 10:43   Ct Abdomen Pelvis W Contrast  11/13/2012   CLINICAL DATA:  79 year old female with blood in stool. Rectal bleeding and left lower quadrant pain. Initial encounter.  EXAM: CT ABDOMEN AND PELVIS WITH CONTRAST  TECHNIQUE: Multidetector CT imaging of the abdomen and pelvis was performed using the standard protocol following bolus administration of intravenous contrast.  CONTRAST:  80mL OMNIPAQUE IOHEXOL 300 MG/ML  SOLN  COMPARISON:  CT Abdomen and Pelvis 12/03/2011.  FINDINGS: Chronic moderate to large retrocardiac gastric hernia not significantly changed. No pericardial or pleural effusion. Negative lung bases.  Diffuse degenerative disc and endplate changes in the visible spine. No acute osseous abnormality identified. advanced degenerative changes at the left hip.  Mildly distended but otherwise negative bladder. No pelvic free fluid. Rectum within normal limits. Uterus surgically absent.  Severe diverticulosis of the sigmoid colon which also is redundant. No active inflammation. Severe diverticulosis throughout the descending and transverse colon segments. Severe diverticulosis continues to the cecum. No focal colonic inflammation.  Mildly if loculated material in the terminal ileum. There also appear to be several distal ileum diverticula, unchanged (series 2, image 46, 48, 54). Oral contrast has just reached the distal small bowel. No dilated small bowel. Negative intra abdominal stomach. Negative duodenum.  Gallbladder surgically absent. Stable liver with small subcapsular cyst in the left lobe. Negative spleen, pancreas, and portal venous system. Stable adrenal hyperplasia. Stable kidneys with multiple small low-density areas compatible with simple  cysts and nonobstructing right nephrolithiasis versus is calcified atherosclerosis.  No abdominal free fluid. Major arterial structures in the abdomen and pelvis are patent despite widespread atherosclerosis.  IMPRESSION: 1. Diffuse severe colonic diverticulosis. No active inflammation to suggest diverticulitis, but consider diverticular bleeding as a source of lower GI blood.  2. Otherwise stable abdomen and pelvis.   Electronically Signed   By: Augusto Gamble M.D.   On: 11/13/2012 15:08    Scheduled Meds: . pantoprazole (PROTONIX) IV  40 mg Intravenous Q12H  . sodium chloride  3 mL Intravenous Q12H    Principal Problem:   Bright red blood per rectum Active Problems:   History of GI diverticular bleed   Diverticulosis   Hypertension    Mauro Kaufmann [facility] Pager [phone]. If 7PM-7AM, please contact night-coverage at [email or web address], password [facility] 11/15/2012, 12:56 PM  LOS: 2 days

## 2012-11-16 DIAGNOSIS — J209 Acute bronchitis, unspecified: Secondary | ICD-10-CM | POA: Diagnosis present

## 2012-11-16 LAB — BASIC METABOLIC PANEL
BUN: 12 mg/dL (ref 6–23)
CO2: 28 mEq/L (ref 19–32)
Calcium: 8.5 mg/dL (ref 8.4–10.5)
Chloride: 104 mEq/L (ref 96–112)
Creatinine, Ser: 1.23 mg/dL — ABNORMAL HIGH (ref 0.50–1.10)
GFR calc Af Amer: 44 mL/min — ABNORMAL LOW (ref >90)
GFR calc non Af Amer: 38 mL/min — ABNORMAL LOW (ref >90)
Glucose, Bld: 107 mg/dL — ABNORMAL HIGH (ref 70–99)
Potassium: 4 mEq/L (ref 3.5–5.1)
Sodium: 138 mEq/L (ref 135–145)

## 2012-11-16 LAB — CBC
HCT: 27.9 % — ABNORMAL LOW (ref 36.0–46.0)
HCT: 31.9 % — ABNORMAL LOW (ref 36.0–46.0)
Hemoglobin: 9.5 g/dL — ABNORMAL LOW (ref 12.0–15.0)
Hemoglobin: 10.8 g/dL — ABNORMAL LOW (ref 12.0–15.0)
MCH: 30.4 pg (ref 26.0–34.0)
MCH: 30.3 pg (ref 26.0–34.0)
MCHC: 34.1 g/dL (ref 30.0–36.0)
MCHC: 33.9 g/dL (ref 30.0–36.0)
MCV: 89.4 fL (ref 78.0–100.0)
MCV: 89.6 fL (ref 78.0–100.0)
Platelets: 227 10*3/uL (ref 150–400)
Platelets: 243 10*3/uL (ref 150–400)
RBC: 3.12 MIL/uL — ABNORMAL LOW (ref 3.87–5.11)
RBC: 3.56 MIL/uL — ABNORMAL LOW (ref 3.87–5.11)
RDW: 14.8 % (ref 11.5–15.5)
RDW: 14.5 % (ref 11.5–15.5)
WBC: 7.6 10*3/uL (ref 4.0–10.5)
WBC: 8.1 10*3/uL (ref 4.0–10.5)

## 2012-11-16 MED ORDER — IPRATROPIUM BROMIDE 0.02 % IN SOLN
0.5000 mg | Freq: Two times a day (BID) | RESPIRATORY_TRACT | Status: DC
  Administered 2012-11-17: 0.5 mg via RESPIRATORY_TRACT
  Filled 2012-11-16: qty 2.5

## 2012-11-16 MED ORDER — IPRATROPIUM BROMIDE 0.02 % IN SOLN: 0.5000 mg | Freq: Two times a day (BID) | RESPIRATORY_TRACT | Status: DC

## 2012-11-16 MED ORDER — GUAIFENESIN ER 600 MG PO TB12
600.0000 mg | ORAL_TABLET | Freq: Two times a day (BID) | ORAL | Status: DC
  Administered 2012-11-16 – 2012-11-17 (×3): 600 mg via ORAL
  Filled 2012-11-16 (×6): qty 1

## 2012-11-16 MED ORDER — LEVOFLOXACIN 500 MG PO TABS
500.0000 mg | ORAL_TABLET | Freq: Every day | ORAL | Status: DC
  Administered 2012-11-16 – 2012-11-17 (×2): 500 mg via ORAL
  Filled 2012-11-16 (×2): qty 1

## 2012-11-16 MED ORDER — FUROSEMIDE 10 MG/ML IJ SOLN
20.0000 mg | Freq: Once | INTRAMUSCULAR | Status: AC
  Administered 2012-11-16: 20 mg via INTRAVENOUS
  Filled 2012-11-16: qty 2

## 2012-11-16 MED ORDER — ALBUTEROL SULFATE (5 MG/ML) 0.5% IN NEBU: 2.5000 mg | INHALATION_SOLUTION | RESPIRATORY_TRACT | Status: DC

## 2012-11-16 MED ORDER — PANTOPRAZOLE SODIUM 40 MG PO TBEC
40.0000 mg | DELAYED_RELEASE_TABLET | Freq: Two times a day (BID) | ORAL | Status: DC
  Administered 2012-11-17: 40 mg via ORAL
  Filled 2012-11-16: qty 1

## 2012-11-16 MED ORDER — ALBUTEROL SULFATE (5 MG/ML) 0.5% IN NEBU
2.5000 mg | INHALATION_SOLUTION | Freq: Two times a day (BID) | RESPIRATORY_TRACT | Status: DC
  Administered 2012-11-17: 2.5 mg via RESPIRATORY_TRACT
  Filled 2012-11-16: qty 0.5

## 2012-11-16 MED ORDER — ALBUTEROL SULFATE (5 MG/ML) 0.5% IN NEBU
2.5000 mg | INHALATION_SOLUTION | RESPIRATORY_TRACT | Status: DC
  Administered 2012-11-16 (×3): 2.5 mg via RESPIRATORY_TRACT
  Filled 2012-11-16 (×3): qty 0.5

## 2012-11-16 MED ORDER — IPRATROPIUM BROMIDE 0.02 % IN SOLN
0.5000 mg | RESPIRATORY_TRACT | Status: DC
  Administered 2012-11-16 (×3): 0.5 mg via RESPIRATORY_TRACT
  Filled 2012-11-16 (×3): qty 2.5

## 2012-11-16 NOTE — Progress Notes (Signed)
[facility] PROGRESS NOTE  [name] WUX:[id number] DOB: 12-Jan-1924 DOA: 11/13/2012 PCP: No primary provider on file.  HPI/Subjective: 79 yo AAF with pertinent history of recurrent diverticular hemorrhage, never requiring transfusion, with the most recent episode in January 2014.  She also has history of HTN, large hiatal hernia with GERD, and hyperlipidemia.  Presented to [facility] 11/14/12 due to multiple episodes of BRBPR over the last two days.  ED labs revealed iron-def anemia and CT abdomen showed severe pancolonic diverticulosis without inflammation, thus she was admitted for observation.  Patient complains of cough, and clear phlegm.  Assessment/Plan:  Hematochezia/Diverticulosis: - Secondary to severe diverticular disease noted on CT - D/C ASA until bleeding subsides - IVF - GI consult appreciated (Dr. Matthias Hughs)- agree with observation and attempt colonoscopy if bleeding persists or if she becomes hemodynamically unstable. - Transfuse 2 units PRBC 11/14/12; HgB decreased to 7.8 from 10.3 on admission - Will continue to monitor CBC and transfuse as necessary - Today the hb is 9.5  Acute Bronchitis - Patient says she has been coughing after she got blood transfusion - CXR shows no edema but has a upper thoracic  radiographic density, and radiologist recommend to get CT Chest -CT chest showed 4-5 mm benign nodule over the left apex. Recommend 12 month follow up CT chest. - She has bilateral wheezing, will start Duonebs, levaquin.  Hypertension: - Stable - hold home med Metoprolol for now.  Will resume when BP is stronger. - Will continue to monitor  Hiatal Hernia/GERD: - Stable - IV Protonix 40mg  q 12h - Appreciate GI consult and agree -.  DVT Prophylaxis: - GI Bleed - SCDs as can tolerate  Code Status: Full Family Communication: None at bedside  Disposition Plan: Remain  inpatient   Consultants:  GI  Procedures:  None  Antibiotics:  None    Objective: Filed Vitals:   11/16/12 1434  BP: 121/70  Pulse: 106  Temp: 98.8 F (37.1 C)  Resp: 20    Intake/Output Summary (Last 24 hours) at 11/16/12 1511 Last data filed at 11/16/12 0600  Gross per 24 hour  Intake    180 ml  Output      0 ml  Net    180 ml   Filed Weights   11/13/12 0958 11/13/12 1215  Weight: 96.5 kg (212 lb 11.9 oz) 96.163 kg (212 lb)    Exam:   General:  Patient starts coughing while talking  Cardiovascular: RRR  Respiratory: Bilateral wheezing.  Abdomen: Soft, non-tender.  No masses No organomegaly  Musculoskeletal: Full ROM.  Strength 5/5 all extremities.      Data Reviewed: Basic Metabolic Panel:  Recent Labs Lab 11/13/12 1230 11/14/12 0451 11/16/12 0442  NA 139 138 138  K 4.5 4.2 4.0  CL 104 104 104  CO2 26 26 28   GLUCOSE 107* 92 107*  BUN 21 20 12   CREATININE 1.23* 1.26* 1.23*  CALCIUM 9.4 8.7 8.5   Liver Function Tests:  Recent Labs Lab 11/13/12 1230  AST 17  ALT 7  ALKPHOS 67  BILITOT 0.3  PROT 7.1  ALBUMIN 3.6   CBC:  Recent Labs Lab 11/14/12 0809 11/14/12 2232 11/15/12 0800 11/15/12 1811 11/16/12 0810  WBC 3.4* 6.0 8.7 9.8 7.6  HGB 8.1* 10.2* 10.6* 10.6* 9.5*  HCT 24.3* 30.2* 30.8* 31.1* 27.9*  MCV 90.0 87.8 88.8 88.6 89.4  PLT 207 204 228 235 227     Studies: Dg Chest 2 View  11/15/2012   CLINICAL  DATA:  Cough  EXAM: CHEST  2 VIEW  COMPARISON:  Chest radiograph 02/23/2012  FINDINGS: Stable enlarged cardiac silhouette. No effusion, infiltrate, or pneumothorax. Moderate hiatal hernia noted. Ectatic aorta.  There is extra density over the upper mediastinum above the aorta. Additionally, there is small gas collection left adjacent to the trachea at the level of the thoracic inlet corresponds to a probable Zenker's diverticulum seen on CT of 01/05/2009. Marland Kitchen  IMPRESSION: Extra density in upper mediastinum. This may be  projectional. Recommend attention on followup radiographs or CT thorax.  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   Electronically Signed   By: Genevive Bi M.D.   On: 11/15/2012 10:43   Ct Chest W Contrast  11/15/2012   CLINICAL DATA:  Cough, upper mediastinal density on recent chest x-ray.  EXAM: CT CHEST WITH CONTRAST  TECHNIQUE: Multidetector CT imaging of the chest was performed during intravenous contrast administration.  CONTRAST:  64mL OMNIPAQUE IOHEXOL 300 MG/ML  SOLN  COMPARISON:  CT chest 01/05/2009 and CT abdomen 11/13/2012.  FINDINGS: Lungs are adequately inflated. There is a 4-5 mm nodule over the left apex. The remainder of the lungs are clear. Item there is a moderate size hiatal hernia. There is an air-fluid level over the distal esophagus likely due to reflux. Heart is normal in size. There is calcification of the coronary arteries. Remaining mediastinal structures are within normal. There is no evidence of a superior mediastinal mass.  Images through the upper abdomen demonstrate diverticulosis of the colon as well as bilateral renal cysts unchanged. There is spondylosis of the spine.  IMPRESSION: No acute findings. No focal abnormality over the upper mediastinum.  4-5 mm nodule over the left apex likely benign. If the patient is at high risk for bronchogenic carcinoma, follow-up chest CT at 6-12 months is recommended. If the patient is at low risk for bronchogenic carcinoma, follow-up chest CT at 12 months is recommended. This recommendation follows the consensus statement: Guidelines for Management of Small Pulmonary Nodules Detected on CT Scans: A Statement from the Fleischner Society as published in Radiology 2005;237:395-400.  Bilateral renal cysts.  Diverticulosis of the colon.  Moderate size hiatal hernia.   Electronically Signed   By: Elberta Fortis M.D.   On: 11/15/2012 16:28    Scheduled Meds: . ipratropium  0.5 mg Nebulization Q4H    And  . albuterol  2.5 mg Nebulization Q4H  . guaiFENesin  600 mg Oral BID  . levofloxacin  500 mg Oral Daily  . pantoprazole (PROTONIX) IV  40 mg Intravenous Q12H  . sodium chloride  3 mL Intravenous Q12H    Principal Problem:   Bright red blood per rectum Active Problems:   History of GI diverticular bleed   Diverticulosis   Hypertension    Mauro Kaufmann [facility] Pager [phone]. If 7PM-7AM, please contact night-coverage at [email or web address], password [facility] 11/16/2012, 3:11 PM  LOS: 3 days

## 2012-11-16 NOTE — Progress Notes (Signed)
Patient ID: [name], female   DOB: May 05, 1923, 79 y.o.   MRN: [id number] [facility] Gastroenterology Progress Note  [name] 79 y.o. [birth year]   Subjective: Wants to eat. Feels fair. Denies any BMs overnight (1 BM recorded -appearance not known)  Objective: Vital signs: Filed Vitals:   11/16/12 1434  BP: 121/70  Pulse: 106  Temp: 98.8 F (37.1 C)  Resp: 20    Physical Exam: Gen: alert, no acute distress  Abd: soft, nontender, nondistended  Lab Results:  Recent Labs  11/14/12 0451 11/16/12 0442  NA 138 138  K 4.2 4.0  CL 104 104  CO2 26 28  GLUCOSE 92 107*  BUN 20 12  CREATININE 1.26* 1.23*  CALCIUM 8.7 8.5   No results found for this basename: AST, ALT, ALKPHOS, BILITOT, PROT, ALBUMIN,  in the last 72 hours  Recent Labs  11/15/12 1811 11/16/12 0810  WBC 9.8 7.6  HGB 10.6* 9.5*  HCT 31.1* 27.9*  MCV 88.6 89.4  PLT 235 227      Assessment/Plan: Resolving diverticular bleed - advance diet. Hgb 9.5 (10.6). Reports hiccups with pudding but denies any problems eating at home. Will give soft diet and can advance further if tolerated. Will sign off. Call if questions.   [name] C. 11/16/2012, 4:12 PM

## 2012-11-17 DIAGNOSIS — K5730 Diverticulosis of large intestine without perforation or abscess without bleeding: Secondary | ICD-10-CM

## 2012-11-17 DIAGNOSIS — J209 Acute bronchitis, unspecified: Secondary | ICD-10-CM

## 2012-11-17 LAB — CBC
HCT: 32.6 % — ABNORMAL LOW (ref 36.0–46.0)
Hemoglobin: 11.1 g/dL — ABNORMAL LOW (ref 12.0–15.0)
MCH: 30.6 pg (ref 26.0–34.0)
MCHC: 34 g/dL (ref 30.0–36.0)
MCV: 89.8 fL (ref 78.0–100.0)
Platelets: 255 10*3/uL (ref 150–400)
RBC: 3.63 MIL/uL — ABNORMAL LOW (ref 3.87–5.11)
RDW: 14.6 % (ref 11.5–15.5)
WBC: 8.6 10*3/uL (ref 4.0–10.5)

## 2012-11-17 MED ORDER — GUAIFENESIN ER 600 MG PO TB12: 1200.0000 mg | ORAL_TABLET | Freq: Two times a day (BID) | ORAL | Status: DC

## 2012-11-17 MED ORDER — LEVOFLOXACIN 500 MG PO TABS: 500.0000 mg | ORAL_TABLET | Freq: Every day | ORAL | Status: DC

## 2012-11-17 NOTE — Progress Notes (Signed)
[name] to be D/C'd Home per MD order.  Discussed with the patient and all questions fully answered.    Medication List         acetaminophen 325 MG tablet  Commonly known as:  TYLENOL  Take 650 mg by mouth every 6 (six) hours as needed. pain     aspirin EC 81 MG tablet  Take 81 mg by mouth daily.     cholecalciferol 1000 UNITS tablet  Commonly known as:  VITAMIN D  Take 2,000 Units by mouth daily.     ciprofloxacin-dexamethasone otic suspension  Commonly known as:  CIPRODEX  Place 2 drops into both ears daily as needed. For ear dryness and discomfort     esomeprazole 40 MG capsule  Commonly known as:  NEXIUM  Take 40 mg by mouth daily before breakfast.     ferrous sulfate 325 (65 FE) MG tablet  Take 325 mg by mouth daily with breakfast.     guaiFENesin 600 MG 12 hr tablet  Commonly known as:  MUCINEX  Take 2 tablets (1,200 mg total) by mouth 2 (two) times daily.     HYDROcodone-acetaminophen 5-500 MG per tablet  Commonly known as:  VICODIN  Take 1 tablet by mouth daily as needed. pain     levofloxacin 500 MG tablet  Commonly known as:  LEVAQUIN  Take 1 tablet (500 mg total) by mouth daily.     losartan-hydrochlorothiazide 100-12.5 MG per tablet  Commonly known as:  HYZAAR  Take 1 tablet by mouth daily.     metoprolol tartrate 25 MG tablet  Commonly known as:  LOPRESSOR  Take 25 mg by mouth 2 (two) times daily.     multivitamin with minerals Tabs tablet  Take 1 tablet by mouth daily.     nitroGLYCERIN 0.4 MG SL tablet  Commonly known as:  NITROSTAT  Place 0.4 mg under the tongue every 5 (five) minutes as needed.     PATADAY OP  Apply 1 drop to eye daily as needed. For allergy dryness     polyethylene glycol packet  Commonly known as:  MIRALAX / GLYCOLAX  Take 17 g by mouth daily as needed. constipation     potassium chloride 10 MEQ tablet  Commonly known as:  K-DUR  Take 10 mEq by mouth daily.        VVS, Skin clean, dry and intact without  evidence of skin break down, no evidence of skin tears noted.   An After Visit Summary was printed and given to the patient. Patient escorted via WC, and D/C home via private auto.  Burt Ek 11/17/2012 2:34 PM

## 2012-11-17 NOTE — Discharge Summary (Signed)
Physician Discharge Summary  [name] ZOX:[id number] DOB: 1923-08-29 DOA: 11/13/2012  PCP: No primary provider on file.  Admit date: 11/13/2012 Discharge date: 11/17/2012  Time spent: 40 minutes  Recommendations for Outpatient Follow-up:  1. Followup with primary care physician within one week. 2. Check CBC in one week to ensure stable hemoglobin.  Discharge Diagnoses:  Principal Problem:   Bright red blood per rectum Active Problems:   History of GI diverticular bleed   Diverticulosis   Hypertension   Acute bronchitis   Discharge Condition: Stable  Diet recommendation: Heart healthy  Filed Weights   11/13/12 0958 11/13/12 1215  Weight: 96.5 kg (212 lb 11.9 oz) 96.163 kg (212 lb)    History of present illness:  [name] is a 79 y.o. female  With h/o diverticulosis and prior admission for lower GI bleeding who presents to the hospital complaining of painless bloody BMs for the past 2 days. Nothing she is aware of has made it better or worse. She has had this in the past and the bleeding has resolved spontaneously before. She denies any nausea, fever, or chills. States that she takes aspirin daily. Reports noticing red blood from her rectum with bowel movement.  In the ED patient was evaluated and found to have a hgb of 10.3. Given her presenting complaint we were consulted for further evaluation and recommendations regarding her painless GI bleed  Hospital Course:   1. Rectal bleeding: Likely secondary to diverticular disease noted on CT scan. Patient came to the hospital on aspirin that was discontinued and GI was consulted. Recommended observation and only time, his bleeding persists. Bleeding improved and hemoglobin currently stable as well or her vitals, so patient was felt stable to be discharged home  2. Anemia: Acute blood loss anemia on top of chronic anemia, status post transfusion of 2 units of packed RBCs on 11/14/2012, hemoglobin hit a nadir of 7.8, today  is 11.1.  3. Acute bronchitis: While patient in the hospital she developed some cough and shortness of breath, this is thought initially to be fluid overload but chest x-ray did not show any fluids. Patient also has. Mucous no antibiotics with Mucinex was started. Please note that patient has 45 mm benign nodule on the left fracture, recommend a CT scan followup in 12 months.  4. Hypertension: Stable, all medications all the time of admission restarted on discharge.  5. Hiatal hernia: Stable, patient was on Protonix IV and restarted back on her home medications.  Procedures:  None  Consultations:  [facility]  Discharge Exam: Filed Vitals:   11/17/12 0515  BP: 114/64  Pulse: 84  Temp: 98.6 F (37 C)  Resp: 20   General: Alert and awake, oriented x3, not in any acute distress. HEENT: anicteric sclera, pupils reactive to light and accommodation, EOMI CVS: S1-S2 clear, no murmur rubs or gallops Chest: clear to auscultation bilaterally, no wheezing, rales or rhonchi Abdomen: soft nontender, nondistended, normal bowel sounds, no organomegaly Extremities: no cyanosis, clubbing or edema noted bilaterally Neuro: Cranial nerves II-XII intact, no focal neurological deficits  Discharge Instructions  Discharge Orders   Future Orders Complete By Expires   Diet - low sodium heart healthy  As directed    Increase activity slowly  As directed        Medication List         acetaminophen 325 MG tablet  Commonly known as:  TYLENOL  Take 650 mg by mouth every 6 (six) hours as needed.  pain     aspirin EC 81 MG tablet  Take 81 mg by mouth daily.     cholecalciferol 1000 UNITS tablet  Commonly known as:  VITAMIN D  Take 2,000 Units by mouth daily.     ciprofloxacin-dexamethasone otic suspension  Commonly known as:  CIPRODEX  Place 2 drops into both ears daily as needed. For ear dryness and discomfort     esomeprazole 40 MG capsule  Commonly known as:  NEXIUM  Take 40 mg by mouth  daily before breakfast.     ferrous sulfate 325 (65 FE) MG tablet  Take 325 mg by mouth daily with breakfast.     guaiFENesin 600 MG 12 hr tablet  Commonly known as:  MUCINEX  Take 2 tablets (1,200 mg total) by mouth 2 (two) times daily.     HYDROcodone-acetaminophen 5-500 MG per tablet  Commonly known as:  VICODIN  Take 1 tablet by mouth daily as needed. pain     levofloxacin 500 MG tablet  Commonly known as:  LEVAQUIN  Take 1 tablet (500 mg total) by mouth daily.     losartan-hydrochlorothiazide 100-12.5 MG per tablet  Commonly known as:  HYZAAR  Take 1 tablet by mouth daily.     metoprolol tartrate 25 MG tablet  Commonly known as:  LOPRESSOR  Take 25 mg by mouth 2 (two) times daily.     multivitamin with minerals Tabs tablet  Take 1 tablet by mouth daily.     nitroGLYCERIN 0.4 MG SL tablet  Commonly known as:  NITROSTAT  Place 0.4 mg under the tongue every 5 (five) minutes as needed.     PATADAY OP  Apply 1 drop to eye daily as needed. For allergy dryness     polyethylene glycol packet  Commonly known as:  MIRALAX / GLYCOLAX  Take 17 g by mouth daily as needed. constipation     potassium chloride 10 MEQ tablet  Commonly known as:  K-DUR  Take 10 mEq by mouth daily.       Allergies  Allergen Reactions  . Ezetimibe Anaphylaxis  . Cholestyramine Nausea And Vomiting  . Lipitor [Atorvastatin] Swelling  . Pravachol [Pravastatin] Swelling  . Statins Swelling    Swelling of mouth and lips  . Zocor [Simvastatin] Swelling      The results of significant diagnostics from this hospitalization (including imaging, microbiology, ancillary and laboratory) are listed below for reference.    Significant Diagnostic Studies: Dg Chest 2 View  11/15/2012   CLINICAL DATA:  Cough  EXAM: CHEST  2 VIEW  COMPARISON:  Chest radiograph 02/23/2012  FINDINGS: Stable enlarged cardiac silhouette. No effusion, infiltrate, or pneumothorax. Moderate hiatal hernia noted. Ectatic aorta.   There is extra density over the upper mediastinum above the aorta. Additionally, there is small gas collection left adjacent to the trachea at the level of the thoracic inlet corresponds to a probable Zenker's diverticulum seen on CT of 01/05/2009. Marland Kitchen  IMPRESSION: Extra density in upper mediastinum. This may be projectional. Recommend attention on followup radiographs or CT thorax.  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   Electronically Signed   By: Genevive Bi M.D.   On: 11/15/2012 10:43   Ct Chest W Contrast  11/15/2012   CLINICAL DATA:  Cough, upper mediastinal density on recent chest x-ray.  EXAM: CT CHEST WITH CONTRAST  TECHNIQUE: Multidetector CT imaging of the chest was performed during intravenous contrast administration.  CONTRAST:  64mL OMNIPAQUE  IOHEXOL 300 MG/ML  SOLN  COMPARISON:  CT chest 01/05/2009 and CT abdomen 11/13/2012.  FINDINGS: Lungs are adequately inflated. There is a 4-5 mm nodule over the left apex. The remainder of the lungs are clear. Item there is a moderate size hiatal hernia. There is an air-fluid level over the distal esophagus likely due to reflux. Heart is normal in size. There is calcification of the coronary arteries. Remaining mediastinal structures are within normal. There is no evidence of a superior mediastinal mass.  Images through the upper abdomen demonstrate diverticulosis of the colon as well as bilateral renal cysts unchanged. There is spondylosis of the spine.  IMPRESSION: No acute findings. No focal abnormality over the upper mediastinum.  4-5 mm nodule over the left apex likely benign. If the patient is at high risk for bronchogenic carcinoma, follow-up chest CT at 6-12 months is recommended. If the patient is at low risk for bronchogenic carcinoma, follow-up chest CT at 12 months is recommended. This recommendation follows the consensus statement: Guidelines for Management of Small Pulmonary Nodules Detected on  CT Scans: A Statement from the Fleischner Society as published in Radiology 2005;237:395-400.  Bilateral renal cysts.  Diverticulosis of the colon.  Moderate size hiatal hernia.   Electronically Signed   By: Elberta Fortis M.D.   On: 11/15/2012 16:28   Ct Abdomen Pelvis W Contrast  11/13/2012   CLINICAL DATA:  79 year old female with blood in stool. Rectal bleeding and left lower quadrant pain. Initial encounter.  EXAM: CT ABDOMEN AND PELVIS WITH CONTRAST  TECHNIQUE: Multidetector CT imaging of the abdomen and pelvis was performed using the standard protocol following bolus administration of intravenous contrast.  CONTRAST:  80mL OMNIPAQUE IOHEXOL 300 MG/ML  SOLN  COMPARISON:  CT Abdomen and Pelvis 12/03/2011.  FINDINGS: Chronic moderate to large retrocardiac gastric hernia not significantly changed. No pericardial or pleural effusion. Negative lung bases.  Diffuse degenerative disc and endplate changes in the visible spine. No acute osseous abnormality identified. advanced degenerative changes at the left hip.  Mildly distended but otherwise negative bladder. No pelvic free fluid. Rectum within normal limits. Uterus surgically absent.  Severe diverticulosis of the sigmoid colon which also is redundant. No active inflammation. Severe diverticulosis throughout the descending and transverse colon segments. Severe diverticulosis continues to the cecum. No focal colonic inflammation.  Mildly if loculated material in the terminal ileum. There also appear to be several distal ileum diverticula, unchanged (series 2, image 46, 48, 54). Oral contrast has just reached the distal small bowel. No dilated small bowel. Negative intra abdominal stomach. Negative duodenum.  Gallbladder surgically absent. Stable liver with small subcapsular cyst in the left lobe. Negative spleen, pancreas, and portal venous system. Stable adrenal hyperplasia. Stable kidneys with multiple small low-density areas compatible with simple cysts and  nonobstructing right nephrolithiasis versus is calcified atherosclerosis.  No abdominal free fluid. Major arterial structures in the abdomen and pelvis are patent despite widespread atherosclerosis.  IMPRESSION: 1. Diffuse severe colonic diverticulosis. No active inflammation to suggest diverticulitis, but consider diverticular bleeding as a source of lower GI blood.  2. Otherwise stable abdomen and pelvis.   Electronically Signed   By: Augusto Gamble M.D.   On: 11/13/2012 15:08    Microbiology: No results found for this or any previous visit (from the past 240 hour(s)).   Labs: Basic Metabolic Panel:  Recent Labs Lab 11/13/12 1230 11/14/12 0451 11/16/12 0442  NA 139 138 138  K 4.5 4.2 4.0  CL 104 104 104  CO2 26 26 28   GLUCOSE 107* 92 107*  BUN 21 20 12   CREATININE 1.23* 1.26* 1.23*  CALCIUM 9.4 8.7 8.5   Liver Function Tests:  Recent Labs Lab 11/13/12 1230  AST 17  ALT 7  ALKPHOS 67  BILITOT 0.3  PROT 7.1  ALBUMIN 3.6   No results found for this basename: LIPASE, AMYLASE,  in the last 168 hours No results found for this basename: AMMONIA,  in the last 168 hours CBC:  Recent Labs Lab 11/15/12 0800 11/15/12 1811 11/16/12 0810 11/16/12 1838 11/17/12 0921  WBC 8.7 9.8 7.6 8.1 8.6  HGB 10.6* 10.6* 9.5* 10.8* 11.1*  HCT 30.8* 31.1* 27.9* 31.9* 32.6*  MCV 88.8 88.6 89.4 89.6 89.8  PLT 228 235 227 243 255   Cardiac Enzymes: No results found for this basename: CKTOTAL, CKMB, CKMBINDEX, TROPONINI,  in the last 168 hours BNP: BNP (last 3 results)  Recent Labs  11/15/12 1220  PROBNP 133.2   CBG: No results found for this basename: GLUCAP,  in the last 168 hours     Signed:  Malka Bocek A  [facility] 11/17/2012, 11:02 AM

## 2012-11-17 NOTE — Progress Notes (Signed)
Patient has slept through-out night with easy respirations.

## 2012-11-17 NOTE — Progress Notes (Signed)
Patient to be discharged this afternoon.  Patient states that she needs a "bath bench" and a "cane" before she goes home.  PT consulted and case worker notified.

## 2012-11-17 NOTE — Care Management Note (Signed)
    Page 1 of 2   11/17/2012     2:47:02 PM   CARE MANAGEMENT NOTE 11/17/2012  Patient:  [name], [name]   Account Number:  [id number]  Date Initiated:  11/17/2012  Documentation initiated by:  Letha Cape  Subjective/Objective Assessment:   dx gib  admit- lives alone.     Action/Plan:   pt eval-   Anticipated DC Date:  11/17/2012   Anticipated DC Plan:  HOME W HOME HEALTH SERVICES      DC Planning Services  CM consult      Choice offered to / List presented to:     DME arranged  CANE  SHOWER STOOL      DME agency  [facility]     HH arranged  HH-2 PT  HH-4 NURSE'S AIDE  HH-6 SOCIAL WORKER      HH agency  [facility]   Status of service:  Completed, signed off Medicare Important Message given?   (If response is "NO", the following Medicare IM given date fields will be blank) Date Medicare IM given:   Date Additional Medicare IM given:    Discharge Disposition:  HOME W HOME HEALTH SERVICES  Per UR Regulation:  Reviewed for med. necessity/level of care/duration of stay  If discussed at Long Length of Stay Meetings, dates discussed:    Comments:  11/17/12 13:47 Letha Cape RN,BSN [phone] patient lives alone, await pt eval, patien is for dc today, Patient has medication coverage and transportation at dc. Per physical therapy patient has a rolling walker at home. Set pt up with [facility] for HHpt, aide and social worker

## 2012-11-17 NOTE — Evaluation (Signed)
Physical Therapy One Time Evaluation Patient Details Name: [name] MRN: [id number] DOB: 03-28-1923 Today's Date: 11/17/2012 Time: 1351-1410 PT Time Calculation (min): 19 min  PT Assessment / Plan / Recommendation History of Present Illness  Pt is a 79 y/o female with h/o diverticulosis and prior admission for lower GI bleeding who presents to the hospital complaining of painless bloody BMs for the past 2 days. Nothing she is aware of has made it better or worse. She has had this in the past and the bleeding has resolved spontaneously before. She denies any nausea, fever, or chills. States that she takes aspirin daily. Reports noticing red blood from her rectum with bowel movement.    Clinical Impression  Pt admitted with GI bleed. Pt currently with functional limitations due to the deficits listed below ( see PT Problem List). Pt able to ambulate with supervision with RW, however, pt requesting SPC at home because she is unable to use walker inside house due to "not enough room, there's stuff all over". PT recommended ST-SNF in order to improve safety and functional mobility as pt states she is not able to cook for herself and has a difficult time getting around but "does the best she can" and she does not want to go to rehab.  Therefore, PT recommending HHPT to improve LE strength and endurance.  Pt states she does not want people in her home, so PT recommend OPPT if pt denies HHPT.  Since pt discharging today and recommend follow-up with HHPT or OPPT for deficits listed below.    PT Assessment  All further PT needs can be met in the next venue of care    Follow Up Recommendations  Home health PT    Does the patient have the potential to tolerate intense rehabilitation      Barriers to Discharge Decreased caregiver support      Equipment Recommendations  Cane;Other (comment) (shower chair)    Recommendations for Other Services     Frequency      Precautions / Restrictions  Precautions Precautions: Fall Restrictions Weight Bearing Restrictions: No   Pertinent Vitals/Pain Pt reports L hip/knee pain at rest with no increase during ambulation, but unable to rate. Pt states L LE pain is always present and that she is follow-up with PCP.  Pt positioned to comfort at end of session.      Mobility  Bed Mobility Bed Mobility: Supine to Sit;Sitting - Scoot to Edge of Bed Supine to Sit: 6: Modified independent (Device/Increase time) Sitting - Scoot to Edge of Bed: 6: Modified independent (Device/Increase time) Details for Bed Mobility Assistance: Pt able to perform bed mobility at MOD I level in safe manner. Transfers Transfers: Sit to Stand;Stand to Sit Sit to Stand: From bed;With upper extremity assist;5: Supervision Stand to Sit: 5: Supervision;To chair/3-in-1;With upper extremity assist Details for Transfer Assistance: Supervision to ensure safety. Ambulation/Gait Ambulation/Gait Assistance: 4: Min guard Ambulation Distance (Feet): 150 Feet Assistive device: Rolling walker Ambulation/Gait Assistance Details: Pt trialed 1 HHA (R hand) with min guard during first 10' of ambulation, pt grabbing counter and other objects to steady herself. PT encouraged pt to use RW instead and pt able to progress to supervision as pt was much more steady with RW vs. 1 HHA. PT educated pt on the importance of using standard walker at home for safety vs. cane, however, pt reports she does not have enough space at home to use walker and requests SPC. Gait Pattern: Step-through pattern;Antalgic;Decreased stride length;Trunk  flexed Gait velocity: Decreased    Exercises     PT Diagnosis: Difficulty walking;Generalized weakness  PT Problem List: Decreased strength;Decreased activity tolerance;Decreased mobility;Decreased knowledge of use of DME;Pain PT Treatment Interventions:       PT Goals(Current goals can be found in the care plan section) Acute Rehab PT Goals PT Goal  Formulation: No goals set, d/c therapy  Visit Information  Last PT Received On: 11/17/12 Assistance Needed: +1 History of Present Illness: Pt is a 79 y/o female with h/o diverticulosis and prior admission for lower GI bleeding who presents to the hospital complaining of painless bloody BMs for the past 2 days. Nothing she is aware of has made it better or worse. She has had this in the past and the bleeding has resolved spontaneously before. She denies any nausea, fever, or chills. States that she takes aspirin daily. Reports noticing red blood from her rectum with bowel movement.         Prior Functioning  Home Living Family/patient expects to be discharged to:: Private residence Living Arrangements: Alone Type of Home: House Home Access: Stairs to enter [employer] of Steps: 4 Entrance Stairs-Rails: Can reach both Home Layout: One level Home Equipment: Walker - standard;Grab bars - tub/shower;Shower seat (shower seat legs are buckling) Prior Function Level of Independence: Independent with assistive device(s) Communication Communication: No difficulties    Cognition  Cognition Arousal/Alertness: Awake/alert Behavior During Therapy: WFL for tasks assessed/performed Overall Cognitive Status: Within Functional Limits for tasks assessed    Extremity/Trunk Assessment Lower Extremity Assessment Lower Extremity Assessment: LLE deficits/detail;RLE deficits/detail RLE Deficits / Details: Pt reports bil numbness/tingling in feet when standing for prolonged periods of time. LLE Deficits / Details: Pt reports LLE sensation is decreased, unable to assess MMT due to pain. Pt reports bil numbness/tingling in feet when standing for prolonged periods of time. LLE: Unable to fully assess due to pain LLE Sensation: decreased light touch   Balance    End of Session PT - End of Session Equipment Utilized During Treatment: Gait belt Activity Tolerance: Patient limited by fatigue;Patient  limited by pain Patient left: in chair;with call bell/phone within reach Nurse Communication: Mobility status  GP     Sol Blazing 11/17/2012, 2:37 PM

## 2012-11-17 NOTE — Evaluation (Signed)
I have reviewed this note and agree with all findings. Kati Galya Dunnigan, PT, DPT Pager: [phone]   

## 2014-09-08 ENCOUNTER — Emergency Department (HOSPITAL_COMMUNITY)
Admission: EM | Admit: 2014-09-08 | Discharge: 2014-09-08 | Disposition: A | Discharge status: Home / Self Care | Payer: Medicare Other | Attending: Emergency Medicine | Admitting: Emergency Medicine

## 2014-09-08 ENCOUNTER — Encounter (HOSPITAL_COMMUNITY): Payer: Self-pay | Admitting: Emergency Medicine

## 2014-09-08 DIAGNOSIS — Z8639 Personal history of other endocrine, nutritional and metabolic disease: Secondary | ICD-10-CM | POA: Diagnosis not present

## 2014-09-08 DIAGNOSIS — K922 Gastrointestinal hemorrhage, unspecified: Secondary | ICD-10-CM | POA: Insufficient documentation

## 2014-09-08 DIAGNOSIS — Z7982 Long term (current) use of aspirin: Secondary | ICD-10-CM | POA: Insufficient documentation

## 2014-09-08 DIAGNOSIS — I10 Essential (primary) hypertension: Secondary | ICD-10-CM | POA: Diagnosis not present

## 2014-09-08 DIAGNOSIS — R109 Unspecified abdominal pain: Secondary | ICD-10-CM | POA: Diagnosis present

## 2014-09-08 DIAGNOSIS — K219 Gastro-esophageal reflux disease without esophagitis: Secondary | ICD-10-CM | POA: Insufficient documentation

## 2014-09-08 DIAGNOSIS — M25552 Pain in left hip: Secondary | ICD-10-CM | POA: Insufficient documentation

## 2014-09-08 DIAGNOSIS — Z79899 Other long term (current) drug therapy: Secondary | ICD-10-CM | POA: Insufficient documentation

## 2014-09-08 DIAGNOSIS — R42 Dizziness and giddiness: Secondary | ICD-10-CM | POA: Insufficient documentation

## 2014-09-08 LAB — CBC WITH DIFFERENTIAL/PLATELET
Basophils Absolute: 0 10*3/uL (ref 0.0–0.1)
Basophils Relative: 0 % (ref 0–1)
Eosinophils Absolute: 0.2 10*3/uL (ref 0.0–0.7)
Eosinophils Relative: 4 % (ref 0–5)
HCT: 34.5 % — ABNORMAL LOW (ref 36.0–46.0)
Hemoglobin: 11.1 g/dL — ABNORMAL LOW (ref 12.0–15.0)
Lymphocytes Relative: 30 % (ref 12–46)
Lymphs Abs: 1.4 10*3/uL (ref 0.7–4.0)
MCH: 30.2 pg (ref 26.0–34.0)
MCHC: 32.2 g/dL (ref 30.0–36.0)
MCV: 93.8 fL (ref 78.0–100.0)
Monocytes Absolute: 0.3 10*3/uL (ref 0.1–1.0)
Monocytes Relative: 7 % (ref 3–12)
Neutro Abs: 2.9 10*3/uL (ref 1.7–7.7)
Neutrophils Relative %: 59 % (ref 43–77)
Platelets: 248 10*3/uL (ref 150–400)
RBC: 3.68 MIL/uL — ABNORMAL LOW (ref 3.87–5.11)
RDW: 14.9 % (ref 11.5–15.5)
WBC: 4.8 10*3/uL (ref 4.0–10.5)

## 2014-09-08 LAB — URINALYSIS, ROUTINE W REFLEX MICROSCOPIC
Bilirubin Urine: NEGATIVE
Glucose, UA: NEGATIVE mg/dL
Ketones, ur: NEGATIVE mg/dL
Leukocytes, UA: NEGATIVE
Nitrite: NEGATIVE
Protein, ur: NEGATIVE mg/dL
Specific Gravity, Urine: 1.018 (ref 1.005–1.030)
Urobilinogen, UA: 1 mg/dL (ref 0.0–1.0)
pH: 7 (ref 5.0–8.0)

## 2014-09-08 LAB — URINE MICROSCOPIC-ADD ON

## 2014-09-08 LAB — COMPREHENSIVE METABOLIC PANEL
ALT: 12 U/L — ABNORMAL LOW (ref 14–54)
AST: 24 U/L (ref 15–41)
Albumin: 3.6 g/dL (ref 3.5–5.0)
Alkaline Phosphatase: 60 U/L (ref 38–126)
Anion gap: 9 (ref 5–15)
BUN: 22 mg/dL — ABNORMAL HIGH (ref 6–20)
CO2: 23 mmol/L (ref 22–32)
Calcium: 9.2 mg/dL (ref 8.9–10.3)
Chloride: 108 mmol/L (ref 101–111)
Creatinine, Ser: 1.25 mg/dL — ABNORMAL HIGH (ref 0.44–1.00)
GFR calc Af Amer: 43 mL/min — ABNORMAL LOW (ref >60)
GFR calc non Af Amer: 37 mL/min — ABNORMAL LOW (ref >60)
Glucose, Bld: 97 mg/dL (ref 65–99)
Potassium: 4.3 mmol/L (ref 3.5–5.1)
Sodium: 140 mmol/L (ref 135–145)
Total Bilirubin: 0.3 mg/dL (ref 0.3–1.2)
Total Protein: 6.7 g/dL (ref 6.5–8.1)

## 2014-09-08 LAB — POC OCCULT BLOOD, ED: Fecal Occult Bld: POSITIVE — AB

## 2014-09-08 MED ORDER — SODIUM CHLORIDE 0.9 % IV BOLUS (SEPSIS)
500.0000 mL | Freq: Once | INTRAVENOUS | Status: AC
  Administered 2014-09-08: 500 mL via INTRAVENOUS

## 2014-09-08 NOTE — Discharge Instructions (Signed)
°Emergency Department Resource Guide °1) Find a Doctor and Pay Out of Pocket °Although you won't have to find out who is covered by your insurance plan, it is a good idea to ask around and get recommendations. You will then need to call the office and see if the doctor you have chosen will accept you as a new patient and what types of options they offer for patients who are self-pay. Some doctors offer discounts or will set up payment plans for their patients who do not have insurance, but you will need to ask so you aren't surprised when you get to your appointment. ° °2) Contact Your Local Health Department °Not all health departments have doctors that can see patients for sick visits, but many do, so it is worth a call to see if yours does. If you don't know where your local health department is, you can check in your phone book. The CDC also has a tool to help you locate your state's health department, and many state websites also have listings of all of their local health departments. ° °3) Find a Walk-in Clinic °If your illness is not likely to be very severe or complicated, you may want to try a walk in clinic. These are popping up all over the country in pharmacies, drugstores, and shopping centers. They're usually staffed by nurse practitioners or physician assistants that have been trained to treat common illnesses and complaints. They're usually fairly quick and inexpensive. However, if you have serious medical issues or chronic medical problems, these are probably not your best option. ° °No Primary Care Doctor: °- Call [facility] at  832-8000 - they can help you locate a primary care doctor that  accepts your insurance, provides certain services, etc. °- Physician Referral Service- 1-800-533-3463 ° °Chronic Pain Problems: °Organization         Address  Phone   Notes  °[facility]  (336) 297-2271 Patients need to be referred by their primary care doctor.  ° °Medication  Assistance: °Organization         Address  Phone   Notes  °[place] [facility] [address] °[place], [place] [address] (336) 641-8030 --Must be a resident of [place] °-- Must have NO insurance coverage whatsoever (no Medicaid/ Medicare, etc.) °-- The pt. MUST have a primary care doctor that directs their care regularly and follows them in the community °  °[facility]  (866) 331-1348   °[employer]  (888) 892-1162   ° °Agencies that provide inexpensive medical care: °Organization         Address  Phone   Notes  °[facility]  (336) 832-8035   °[facility]    (336) 832-7272   °[facility] [address] °[place], [place] [address] (336) 832-4777   °[facility] [address], °[place] (336) 271-4999   °[facility]    (336) 373-0678   °[facility]    (336) 272-1050   °[facility] ° [address], [place] Phone:  (336) 832-4444, Fax:  (336) 832-4440 Hours of Operation:  9 am - 6 pm, M-F.  Also accepts Medicaid/Medicare and self-pay.  °[facility] ° [address], [place] Phone: (336) 832-3150, Fax: (336) [phone]. Hours of Operation:  8:30 am - 5:30 pm, M-F.  Also accepts Medicaid and self-pay.  °[facility] [place] [address], [place] Phone: (336) 878-6027   °[facility] [address], [place], [place] (336)723-1848, Ext. 123 Mondays & Thursdays: 7-9 AM.  First 15 patients are seen on a first come, first serve basis. °  ° °Medicaid-accepting [place] Providers: ° °Organization         Address  Phone   Notes  °[facility] [address], [place] (336) 641-2100 Also accepts self-pay patients.  °[facility] [address], [place] ° (336) 856-9996   °[facility] [address], [place]  (336) 288-8857   °[facility] [address] [place] [address], [place] (336) 299-7000   °Veita Bland [address], [place]  ° (336) 373-1557 Only accepts [place] Access Medicaid patients after they have their name applied to their card.  ° °Self-Pay (no insurance) in [place]: ° °Organization         Address  Phone   Notes  °Sickle Cell Patients, [facility] [address], [place] (336) 832-1970   °[facility] [address], [place] (336) 832-4400   °[facility] ° [address], [place] (336) 992-4800   °[facility]/Dr. Osei-Bonsu ° [address] [place] [address], [place] or [address], [place] (336) [phone] Phone number for both [place] and [place] locations is the same.  °[facility] [address], [place] (336) 299-0000   °[facility] [address] [place] [address], [place] or [address] (336) 852-7530 °(336) 878-2260   °[facility] [address], [place] (336) 350-1642, phone; (336) 294-5005, fax Sees patients 1st and 3rd Saturday of every month.  Must not qualify for public or private insurance (i.e. Medicaid, Medicare, [place] Health Choice, Veterans' Benefits) • Household income should be no more than 200% of the poverty level •The clinic cannot treat you if you are pregnant or think you are pregnant • Sexually transmitted diseases are not treated at the clinic.  ° ° °Dental Care: °Organization         Address  Phone  Notes  °[place] [facility] [address], [place] (336) 641-6152 Accepts children up to age 21 who are enrolled in Medicaid or [place] Health Choice; pregnant women with a Medicaid card; and children who have applied for Medicaid or [place] Health Choice, but were declined, whose parents can pay a reduced fee at time of service.  °[place]  [facility] [place]  [address], [place] (336) 641-7733 Accepts children up to age 21 who are enrolled in Medicaid or [place] Health Choice; pregnant women with a Medicaid card; and children who have applied for Medicaid or [place] Health Choice, but were declined, whose parents can pay a reduced fee at time of service.  °[facility] ° [address], [place] (336) 641-4533 Patients are seen by appointment only. Walk-ins are not accepted. [facility] will see patients 18 years of age and older. °Monday - Tuesday (8am-5pm) °Most Wednesdays (8:30-5pm) °$30 per visit, cash only  °[facility] ° [address], [place] (336) 641-4533 Patients are seen by appointment only. Walk-ins are not accepted. [facility] will see patients 18 years of age and older. °One   Wednesday Evening (Monthly: Volunteer Based).  $30 per visit, cash only  °[facility]  (919) 537-3737 for adults; Children under age 4, call Graduate Pediatric Dentistry at (919) [phone]. Children aged 4-14, please call (919) [phone] to request a pediatric application. ° Dental services are provided in all areas of dental care including fillings, crowns and bridges, complete and partial dentures, implants, gum treatment, root canals, and extractions. Preventive care is also provided. Treatment is provided to both adults and children. °Patients are selected via a lottery and there is often a waiting list. °  °[facility] [address], °[place] ° (336) 763-8833 [email or web address] °  °[facility] [address], [place], [place] (336)723-1848, Ext. 123 Second and Fourth Thursday of each month, opens at 6:30 AM; Clinic ends at 9 AM.  Patients are seen on a first-come first-served basis, and a limited number are seen during each clinic.  ° °[facility] ° [address], [place], [place] (336) 723-7904    Eligibility Requirements °You must have lived in [place], or [place] counties for at least the last three months. °  You cannot be eligible for state or federal sponsored healthcare insurance, including Veterans Administration, Medicaid, or Medicare. °  You generally cannot be eligible for healthcare insurance through your employer.  °  How to apply: °Eligibility screenings are held every Tuesday and Wednesday afternoon from 1:00 pm until 4:00 pm. You do not need an appointment for the interview!  °[facility] [address], [place], [place] 336-631-2330   °[facility]  336-342-8273   °[facility]  336-703-3100   °[facility]  336-570-6415   ° °Behavioral Health Resources in the Community: °Intensive Outpatient Programs °Organization         Address  Phone  Notes  °[facility] [address], [place], [place] 336-878-6098   °[facility] [address], [place], [place] 336-832-9800   °[facility] [address], [place], [place] ° 336-882-2125   °[facility] [address],  °[place], [place] 1-800-853-5163 or 336-641-4981   °Substance Abuse Resources °Organization         Address  Phone  Notes  °[facility]  336-882-2125   °[facility]  336-784-9470   °[facility]  336-285-9073   °[facility]  336-845-3988   °Residential & Outpatient Substance Abuse Program  1-800-659-3381   °Psychological Services °Organization         Address  Phone  Notes  °[facility]  336- 832-9600   °[facility]  336- 378-7881   °[facility] [address], [place] 1-800-853-5163 or 336-641-4981   ° °Mobile Crisis Teams °Organization         Address  Phone  Notes  °[facility], Mobile Crisis Care Unit  1-877-626-1772   °Assertive °[facility] ° [address]  [place], [place] 336-834-9664   °Sharon DeEsch [address] °[place] 336-554-5454   ° °Self-Help/Support Groups °Organization         Address  Phone             Notes  °[facility]  - variety of support groups  336- 373-1402 Call for more information  °Narcotics Anonymous (NA), [facility] [address], °[place]  2 meetings at this location  ° °  Residential Treatment Programs Organization         Address  Phone  Notes  [facility] [address]    [place]  1-610-960-4540   [facility]  [address] [place] [address], [place], [place] 478-295-6213   [facility] [address] [place], [place] 086-578-4696 Admissions: 8am-3pm M-F  [facility] [address],    [place], [place] 295-284-1324   [facility] [address] [place], [place], [place] 401-027-2536   [facility] [address]  [place], [place] 644-034-7425   [facility] Intensive Outpatient [address], [place], [place] 956-387-5643   [facility] ([facility] [address] [place]  [place], [place] 3-295-188-4166 or (234)491-6268   [facility] [address] [place], [place] 323-557-3220 Accepts Medicaid  [facility] [address]  [place] 2-542-706-2376 Substance Abuse/Addiction Treatment   [facility] Organization         Address  Phone  Notes  [facility]  (670) 293-8794   Angie Fava, PhD [address] [place], [place]   870-225-3833 or 480-453-4026   [facility]   [address] [place], [place] 684-448-3952   [facility] [address], [place], [place] (769)203-9681 Insurance/Medicaid/sponsorship through [facility] and Families [address]                                    [place], [place] [phone] Therapy/tele-psych/case    [facility] [address], [place] [phone]    Dr. Lolly Mustache  6180955219   [facility]  [employer] [facility] Dept. 1) [address] [place] 2) [address], [place] 3)  [address], [place] [phone] 361-347-1904  513 217 3848   [facility] Child Abuse Hotline (416)314-4656 or (708) 626-5411 (After Hours)       Bloody Stools Bloody stools means there is blood in your poop (stool). It is a sign that there is a problem somewhere in the digestive system. It is important for your doctor to find the cause of your bleeding, so the problem can be treated.  HOME CARE  Only take medicine as told by your doctor.  Eat foods with fiber (prunes, bran cereals).  Drink enough fluids to keep your pee (urine) clear or pale yellow.  Sit in warm water (sitz bath) for 10 to 15 minutes as told by your doctor.  Know how to take your medicines (enemas, suppositories) if advised by your doctor.  Watch for signs that you are getting better or getting worse. GET HELP RIGHT AWAY IF:   You are not getting better.  You start to get better but then get worse again.  You have new problems.  You have severe bleeding from the place where poop comes out (rectum) that does not stop.  You throw up (vomit) blood.  You feel weak or pass out (faint).  You have a fever. MAKE SURE YOU:   Understand these instructions.  Will watch your condition.  Will get help right away if you are not doing well or get worse. Document Released: 01/17/2009 Document Revised: 04/23/2011 Document Reviewed: 06/16/2010 [facility] Patient Information 2015 [place], [place]. This information is  not intended to replace advice given to you by your health care provider. Make sure you discuss any questions you have with your health care provider. ° °

## 2014-09-08 NOTE — ED Provider Notes (Signed)
CSN: [id number]     Arrival date & time 09/08/14  1112 History   First MD Initiated Contact with Patient 09/08/14 1307     Chief Complaint  Patient presents with  . Abdominal Pain  . Blood In Stools  . Hip Pain     (Consider location/radiation/quality/duration/timing/severity/associated sxs/prior Treatment) HPI [name] is an 81 yo AA female presenting to the emergency department today with complaint of dark colored stool since Sunday.  Also complains of intermittment left sided abdominal pain during this time.  She reports recently seeing her PCP on 7/14 for hip pain and was prescribed diclofenac which she has been taking since and discontinued on Sunday.  Reports that eating and drinking have increased her need to have a bowel movement so she has not been eating much at all the past few days.  Reports some dizziness that is probably related to lack of oral intake.  Patient denies any n/v, urinary complaints, chest pain, shortness of breath.   Past Medical History  Diagnosis Date  . Diverticulitis   . History of GI diverticular bleed   . HTN (hypertension)   . High cholesterol   . Acid reflux    History reviewed. No pertinent past surgical history. No family history on file. History  Substance Use Topics  . Smoking status: Never Smoker   . Smokeless tobacco: Never Used  . Alcohol Use: No   OB History    No data available     Review of Systems  Constitutional: Negative for fever and chills.  Respiratory: Negative for cough and shortness of breath.   Cardiovascular: Positive for leg swelling. Negative for chest pain.  Gastrointestinal: Positive for abdominal pain and blood in stool. Negative for nausea and vomiting.  Genitourinary: Negative for dysuria, flank pain and difficulty urinating.  Musculoskeletal:       Left hip pain  Skin: Negative for pallor.  Neurological: Positive for dizziness. Negative for facial asymmetry, speech difficulty, weakness and numbness.       Allergies  Ezetimibe; Cholestyramine; Lipitor; Pravachol; Statins; and Zocor  Home Medications   Prior to Admission medications   Medication Sig Start Date End Date Taking? Authorizing Provider  acetaminophen (TYLENOL) 325 MG tablet Take 650 mg by mouth every 6 (six) hours as needed. pain   Yes Historical Provider, MD  aspirin EC 81 MG tablet Take 81 mg by mouth daily.   Yes Historical Provider, MD  cholecalciferol (VITAMIN D) 1000 UNITS tablet Take 2,000 Units by mouth daily.   Yes Historical Provider, MD  ciprofloxacin-dexamethasone (CIPRODEX) otic suspension Place 2 drops into both ears daily as needed. For ear dryness and discomfort   Yes Historical Provider, MD  esomeprazole (NEXIUM) 40 MG capsule Take 40 mg by mouth daily before breakfast.   Yes Historical Provider, MD  ferrous sulfate 325 (65 FE) MG tablet Take 325 mg by mouth daily with breakfast.   Yes Historical Provider, MD  guaiFENesin (MUCINEX) 600 MG 12 hr tablet Take 2 tablets (1,200 mg total) by mouth 2 (two) times daily. Patient not taking: Reported on 09/08/2014 11/17/12   Clydia Llano, MD  HYDROcodone-acetaminophen (VICODIN) 5-500 MG per tablet Take 1 tablet by mouth daily as needed. pain   Yes Historical Provider, MD  levofloxacin (LEVAQUIN) 500 MG tablet Take 1 tablet (500 mg total) by mouth daily. Patient not taking: Reported on 09/08/2014 11/17/12   Clydia Llano, MD  losartan-hydrochlorothiazide (HYZAAR) 100-12.5 MG per tablet Take 1 tablet by mouth daily.  Yes Historical Provider, MD  metoprolol tartrate (LOPRESSOR) 25 MG tablet Take 25 mg by mouth 2 (two) times daily.   Yes Historical Provider, MD  Multiple Vitamin (MULITIVITAMIN WITH MINERALS) TABS Take 1 tablet by mouth daily.   Yes Historical Provider, MD  nitroGLYCERIN (NITROSTAT) 0.4 MG SL tablet Place 0.4 mg under the tongue every 5 (five) minutes as needed.   Yes Historical Provider, MD  Olopatadine HCl (PATADAY OP) Apply 1 drop to eye daily as needed. For  allergy dryness   Yes Historical Provider, MD  polyethylene glycol (MIRALAX / GLYCOLAX) packet Take 17 g by mouth daily as needed. constipation   Yes Historical Provider, MD  potassium chloride (K-DUR) 10 MEQ tablet Take 10 mEq by mouth daily.   Yes Historical Provider, MD   BP 129/93 mmHg  Pulse 88  Temp(Src) 98.3 F (36.8 C) (Oral)  Resp 16  Ht 5\' 5"  (1.651 m)  Wt 212 lb 5 oz (96.304 kg)  BMI 35.33 kg/m2  SpO2 100% Physical Exam  Constitutional: She is oriented to person, place, and time. She appears well-developed and well-nourished.  HENT:  Head: Normocephalic and atraumatic.  Eyes: EOM are normal.  Cardiovascular: Normal rate and regular rhythm.   Bilateral lower extremity edema noted.  Pulmonary/Chest: Effort normal and breath sounds normal.  Abdominal: Soft. There is tenderness.  Neurological: She is alert and oriented to person, place, and time. She has normal strength. No cranial nerve deficit.  Pt able to ambulate well with use of cane.  No symptoms upon going from lying to standing.    ED Course  Procedures (including critical care time) Labs Review Labs Reviewed  CBC WITH DIFFERENTIAL/PLATELET - Abnormal; Notable for the following:    RBC 3.68 (*)    Hemoglobin 11.1 (*)    HCT 34.5 (*)    All other components within normal limits  COMPREHENSIVE METABOLIC PANEL - Abnormal; Notable for the following:    BUN 22 (*)    Creatinine, Ser 1.25 (*)    ALT 12 (*)    GFR calc non Af Amer 37 (*)    GFR calc Af Amer 43 (*)    All other components within normal limits  URINALYSIS, ROUTINE W REFLEX MICROSCOPIC (NOT AT [facility]) - Abnormal; Notable for the following:    Hgb urine dipstick TRACE (*)    All other components within normal limits  URINE MICROSCOPIC-ADD ON - Abnormal; Notable for the following:    Squamous Epithelial / LPF FEW (*)    Bacteria, UA FEW (*)    All other components within normal limits  POC OCCULT BLOOD, ED - Abnormal; Notable for the following:     Fecal Occult Bld POSITIVE (*)    All other components within normal limits    Imaging Review No results found.   EKG Interpretation None      MDM   Final diagnoses:  Lower GI bleed   81 yo AA female presenting with dark color stool since Sunday with associated dizziness and left sided abdominal pain.  Pt is hemodynamically stable, H/H of 11.1/34.5, WBC 4.8.  Hemoccult cards positive for fecal occult blood.  Urinalysis without evidence of any infection.  Think left sided pain maybe gas because pt reported having pain and then having flatus after.  Less concern for infection with pt being afebrile, normal WBC.  No CN II-XII deficits, strength 5/5 in upper and lower extremities, pt alert and oriented x 3.  Will give NS fluids for  repletion, discontinue diclofenac and recommend close outpatient follow up with PCP.  Understand pt needs to be seen within next few days by PCP but reports she is unhappy with current PCP.  Will provide resource guide at discharge as well as have social work place phone call in the next couple days to help with placement of new PCP.    Gwynn Burly, DO 09/08/14 1639  Marily Memos, MD 09/08/14 [phone]

## 2014-09-08 NOTE — ED Notes (Signed)
Patient states L abdominal pain and blood in stool since Sunday.   Patient states she has been taking pain killers for hip pain and "they make me bleed".  Patient complains of blood in stool that is dark maroon color.  Patient states a lot in stool on Sunday, but has lessened since that time.

## 2014-09-08 NOTE — ED Notes (Signed)
Pt left with all personal belongings including cell phone, home meds, clothing and cane.

## 2014-09-08 NOTE — ED Provider Notes (Signed)
I saw and evaluated the patient, reviewed the resident's note and I agree with the findings and plan.  81 year old female with some dark stools for the last 5-6 days. Hemodynamically stable, no conjunctival pallor normal cap refill. Vital signs normal without any tachycardia or hypotension. Patient has some intermittent dizziness when walking but is able to ambulate without difficulty. Myoglobin similar to previous no other evidence of end organ damage. The patient is taking diclofenac and this is a recent prescription and likely contributing to her dark colored stools. She is unhappy with her current primary care provider so we have given her a list of other providers in the area and social work will call to make sure she get a follow-up appointment within a week. Patient is otherwise stable for discharge.   EKG Interpretation None        Marily Memos, MD 09/08/14 1746

## 2015-02-21 ENCOUNTER — Observation Stay (HOSPITAL_COMMUNITY)
Admission: EM | Admit: 2015-02-21 | Discharge: 2015-02-24 | Disposition: A | Discharge status: Home / Self Care | Payer: Medicare Other | Attending: Internal Medicine | Admitting: Internal Medicine

## 2015-02-21 ENCOUNTER — Encounter (HOSPITAL_COMMUNITY): Payer: Self-pay

## 2015-02-21 DIAGNOSIS — K219 Gastro-esophageal reflux disease without esophagitis: Secondary | ICD-10-CM | POA: Diagnosis not present

## 2015-02-21 DIAGNOSIS — I10 Essential (primary) hypertension: Secondary | ICD-10-CM | POA: Diagnosis present

## 2015-02-21 DIAGNOSIS — K5791 Diverticulosis of intestine, part unspecified, without perforation or abscess with bleeding: Secondary | ICD-10-CM | POA: Diagnosis not present

## 2015-02-21 DIAGNOSIS — K259 Gastric ulcer, unspecified as acute or chronic, without hemorrhage or perforation: Secondary | ICD-10-CM | POA: Diagnosis not present

## 2015-02-21 DIAGNOSIS — Z7982 Long term (current) use of aspirin: Secondary | ICD-10-CM | POA: Insufficient documentation

## 2015-02-21 DIAGNOSIS — Z6838 Body mass index (BMI) 38.0-38.9, adult: Secondary | ICD-10-CM | POA: Insufficient documentation

## 2015-02-21 DIAGNOSIS — E7800 Pure hypercholesterolemia, unspecified: Secondary | ICD-10-CM | POA: Insufficient documentation

## 2015-02-21 DIAGNOSIS — K922 Gastrointestinal hemorrhage, unspecified: Principal | ICD-10-CM | POA: Diagnosis present

## 2015-02-21 DIAGNOSIS — K449 Diaphragmatic hernia without obstruction or gangrene: Secondary | ICD-10-CM | POA: Diagnosis not present

## 2015-02-21 DIAGNOSIS — Z8719 Personal history of other diseases of the digestive system: Secondary | ICD-10-CM | POA: Diagnosis not present

## 2015-02-21 DIAGNOSIS — D62 Acute posthemorrhagic anemia: Secondary | ICD-10-CM | POA: Insufficient documentation

## 2015-02-21 LAB — COMPREHENSIVE METABOLIC PANEL
ALT: 11 U/L — ABNORMAL LOW (ref 14–54)
AST: 20 U/L (ref 15–41)
Albumin: 3.2 g/dL — ABNORMAL LOW (ref 3.5–5.0)
Alkaline Phosphatase: 61 U/L (ref 38–126)
Anion gap: 11 (ref 5–15)
BUN: 17 mg/dL (ref 6–20)
CO2: 26 mmol/L (ref 22–32)
Calcium: 9.4 mg/dL (ref 8.9–10.3)
Chloride: 104 mmol/L (ref 101–111)
Creatinine, Ser: 1.4 mg/dL — ABNORMAL HIGH (ref 0.44–1.00)
GFR calc Af Amer: 37 mL/min — ABNORMAL LOW (ref >60)
GFR calc non Af Amer: 32 mL/min — ABNORMAL LOW (ref >60)
Glucose, Bld: 128 mg/dL — ABNORMAL HIGH (ref 65–99)
Potassium: 4.3 mmol/L (ref 3.5–5.1)
Sodium: 141 mmol/L (ref 135–145)
Total Bilirubin: 0.3 mg/dL (ref 0.3–1.2)
Total Protein: 6.1 g/dL — ABNORMAL LOW (ref 6.5–8.1)

## 2015-02-21 LAB — CBC WITH DIFFERENTIAL/PLATELET
Basophils Absolute: 0 10*3/uL (ref 0.0–0.1)
Basophils Relative: 0 %
Eosinophils Absolute: 0.1 10*3/uL (ref 0.0–0.7)
Eosinophils Relative: 3 %
HCT: 27.8 % — ABNORMAL LOW (ref 36.0–46.0)
Hemoglobin: 9 g/dL — ABNORMAL LOW (ref 12.0–15.0)
Lymphocytes Relative: 25 %
Lymphs Abs: 0.9 10*3/uL (ref 0.7–4.0)
MCH: 30 pg (ref 26.0–34.0)
MCHC: 32.4 g/dL (ref 30.0–36.0)
MCV: 92.7 fL (ref 78.0–100.0)
Monocytes Absolute: 0.4 10*3/uL (ref 0.1–1.0)
Monocytes Relative: 10 %
Neutro Abs: 2.2 10*3/uL (ref 1.7–7.7)
Neutrophils Relative %: 62 %
Platelets: 296 10*3/uL (ref 150–400)
RBC: 3 MIL/uL — ABNORMAL LOW (ref 3.87–5.11)
RDW: 14.9 % (ref 11.5–15.5)
WBC: 3.6 10*3/uL — ABNORMAL LOW (ref 4.0–10.5)

## 2015-02-21 LAB — CBC
HCT: 26.6 % — ABNORMAL LOW (ref 36.0–46.0)
Hemoglobin: 8.6 g/dL — ABNORMAL LOW (ref 12.0–15.0)
MCH: 29.9 pg (ref 26.0–34.0)
MCHC: 32.3 g/dL (ref 30.0–36.0)
MCV: 92.4 fL (ref 78.0–100.0)
Platelets: 289 10*3/uL (ref 150–400)
RBC: 2.88 MIL/uL — ABNORMAL LOW (ref 3.87–5.11)
RDW: 14.9 % (ref 11.5–15.5)
WBC: 3.3 10*3/uL — ABNORMAL LOW (ref 4.0–10.5)

## 2015-02-21 LAB — BASIC METABOLIC PANEL
Anion gap: 8 (ref 5–15)
BUN: 14 mg/dL (ref 6–20)
CO2: 25 mmol/L (ref 22–32)
Calcium: 9.1 mg/dL (ref 8.9–10.3)
Chloride: 108 mmol/L (ref 101–111)
Creatinine, Ser: 1.19 mg/dL — ABNORMAL HIGH (ref 0.44–1.00)
GFR calc Af Amer: 46 mL/min — ABNORMAL LOW (ref >60)
GFR calc non Af Amer: 39 mL/min — ABNORMAL LOW (ref >60)
Glucose, Bld: 88 mg/dL (ref 65–99)
Potassium: 3.8 mmol/L (ref 3.5–5.1)
Sodium: 141 mmol/L (ref 135–145)

## 2015-02-21 LAB — POC OCCULT BLOOD, ED: Fecal Occult Bld: POSITIVE — AB

## 2015-02-21 LAB — PROTIME-INR
INR: 1.18 (ref 0.00–1.49)
Prothrombin Time: 15.2 seconds (ref 11.6–15.2)

## 2015-02-21 MED ORDER — SODIUM CHLORIDE 0.9 % IV BOLUS (SEPSIS)
1000.0000 mL | Freq: Once | INTRAVENOUS | Status: AC
  Administered 2015-02-21: 1000 mL via INTRAVENOUS

## 2015-02-21 MED ORDER — HYDROCODONE-ACETAMINOPHEN 5-325 MG PO TABS: 1.0000 | ORAL_TABLET | Freq: Four times a day (QID) | ORAL | Status: DC | PRN

## 2015-02-21 MED ORDER — HYDROCHLOROTHIAZIDE 12.5 MG PO CAPS
12.5000 mg | ORAL_CAPSULE | Freq: Every day | ORAL | Status: DC
Start: 2015-02-21 — End: 2015-02-24
  Administered 2015-02-21 – 2015-02-24 (×4): 12.5 mg via ORAL
  Filled 2015-02-21 (×5): qty 1

## 2015-02-21 MED ORDER — PANTOPRAZOLE SODIUM 40 MG PO TBEC
80.0000 mg | DELAYED_RELEASE_TABLET | Freq: Every day | ORAL | Status: DC
  Administered 2015-02-21 – 2015-02-24 (×3): 80 mg via ORAL
  Filled 2015-02-21 (×4): qty 2

## 2015-02-21 MED ORDER — LOSARTAN POTASSIUM 50 MG PO TABS
100.0000 mg | ORAL_TABLET | Freq: Every day | ORAL | Status: DC
  Administered 2015-02-21 – 2015-02-24 (×4): 100 mg via ORAL
  Filled 2015-02-21 (×6): qty 2

## 2015-02-21 MED ORDER — METOPROLOL TARTRATE 25 MG PO TABS
25.0000 mg | ORAL_TABLET | Freq: Two times a day (BID) | ORAL | Status: DC
  Administered 2015-02-21 – 2015-02-23 (×6): 25 mg via ORAL
  Filled 2015-02-21 (×6): qty 1

## 2015-02-21 MED ORDER — FERROUS SULFATE 325 (65 FE) MG PO TABS
325.0000 mg | ORAL_TABLET | Freq: Every day | ORAL | Status: DC
  Administered 2015-02-21 – 2015-02-22 (×2): 325 mg via ORAL
  Filled 2015-02-21 (×4): qty 1

## 2015-02-21 NOTE — H&P (Signed)
History and Physical  Patient Name: Brenda GladeBertha C Husak     NWG:[id number]    DOB: 02/19/1923    DOA: 02/21/2015 Referring physician: Dutch Quinthris Pollina, MD PCP: No primary care provider on file.      Chief Complaint: Rectal bleeding  HPI: [name] is a 82 y.o. female with a past medical history significant for HTN and recurrent diverticular bleeding with extensive diverticulosis and known stricturing from this disease who presents with 7 days painless rectal bleeding.  The patient has extensive diverticulosis last colonoscopy attempted in 2012 unsuccessful due to severe sigmoid stricturing associated with diverticular disease (previous colonoscopies showed known vascular ectasia and a pedunculated lipomatous lesion near the ileocecal valve years ago).  She has had numerous diverticular bleeds over the years, only once (in 2014) requiring transfusion. Her last hospitalization was 2 years ago. Her last bleed was 6 months ago (at which time she got IV fluids in the ER and was sent home). Her bleeding episodes typically last about 3 days, are painless bright red bleeding in the toilet bowl, and resolve spontaneously.  The current episode started a week ago Monday, with painless rectal bleeding. She wanted to wait it out, but in the last 3 days she has had 3 bright red blood stools per day and now new weakness, dizziness, unsteadiness on her feet, shortness of breath, and feeling winded with her cane and walker, so she came to get checked out.  In the ED, her hemoglobin was 9 from a baseline in the mid 11's and she was hemodynamically stable.  TRH were asked to evaluate for admission.  She is hoping to be evaluated soon for right hip replacement.      Review of Systems:  Pt complains of rectal bleeding, weakness, unsteady gait, dizziness, shortness of breath, windedness, chronic hip pain. Pt denies any abdominal pain, hematemesis, NSAID use.  All other systems negative except as just noted or noted  in the history of present illness.  Allergies  Allergen Reactions  . Ezetimibe Anaphylaxis  . Cholestyramine Nausea And Vomiting  . Lipitor [Atorvastatin] Swelling  . Pravachol [Pravastatin] Swelling  . Statins Swelling    Swelling of mouth and lips  . Zocor [Simvastatin] Swelling    Prior to Admission medications   Medication Sig Start Date End Date Taking? Authorizing Provider  acetaminophen (TYLENOL) 325 MG tablet Take 650 mg by mouth every 6 (six) hours as needed. pain    Historical Provider, MD  aspirin EC 81 MG tablet Take 81 mg by mouth daily.    Historical Provider, MD  cholecalciferol (VITAMIN D) 1000 UNITS tablet Take 2,000 Units by mouth daily.    Historical Provider, MD  ciprofloxacin-dexamethasone (CIPRODEX) otic suspension Place 2 drops into both ears daily as needed. For ear dryness and discomfort    Historical Provider, MD  esomeprazole (NEXIUM) 40 MG capsule Take 40 mg by mouth daily before breakfast.    Historical Provider, MD  ferrous sulfate 325 (65 FE) MG tablet Take 325 mg by mouth daily with breakfast.    Historical Provider, MD  guaiFENesin (MUCINEX) 600 MG 12 hr tablet Take 2 tablets (1,200 mg total) by mouth 2 (two) times daily. Patient not taking: Reported on 09/08/2014 11/17/12   Clydia LlanoMutaz Elmahi, MD  HYDROcodone-acetaminophen (VICODIN) 5-500 MG per tablet Take 1 tablet by mouth daily as needed. pain    Historical Provider, MD  levofloxacin (LEVAQUIN) 500 MG tablet Take 1 tablet (500 mg total) by mouth daily. Patient not taking: Reported  on 09/08/2014 11/17/12   Clydia Llano, MD  losartan-hydrochlorothiazide (HYZAAR) 100-12.5 MG per tablet Take 1 tablet by mouth daily.    Historical Provider, MD  metoprolol tartrate (LOPRESSOR) 25 MG tablet Take 25 mg by mouth 2 (two) times daily.    Historical Provider, MD  Multiple Vitamin (MULITIVITAMIN WITH MINERALS) TABS Take 1 tablet by mouth daily.    Historical Provider, MD  nitroGLYCERIN (NITROSTAT) 0.4 MG SL tablet Place 0.4  mg under the tongue every 5 (five) minutes as needed.    Historical Provider, MD  Olopatadine HCl (PATADAY OP) Apply 1 drop to eye daily as needed. For allergy dryness    Historical Provider, MD  polyethylene glycol (MIRALAX / GLYCOLAX) packet Take 17 g by mouth daily as needed. constipation    Historical Provider, MD  potassium chloride (K-DUR) 10 MEQ tablet Take 10 mEq by mouth daily.    Historical Provider, MD    Past Medical History  Diagnosis Date  . Diverticulitis   . History of GI diverticular bleed   . HTN (hypertension)   . High cholesterol   . Acid reflux     History reviewed. No pertinent past surgical history.  Family history: family history includes Cancer in her other; Diabetes in her other; Heart disease in her other; Hypertension in her other.  Social History: Patient lives alone. She has 2 children [place]. She is from [place] originally. She is not a smoker. She used to work and must service. Does not drink alcohol.       Physical Exam: BP 121/57 mmHg  Pulse 59  Temp(Src) 98.5 F (36.9 C)  Resp 16  SpO2 100% General appearance: Well-developed, elderly female, alert and in no acute distress.   Eyes: Anicteric, conjunctiva pink, lids and lashes normal.     ENT: No nasal deformity, discharge, or epistaxis.  OP moist without lesions.  Dentures. Lymph: No cervical, supraclavicular lymphadenopathy. Skin: Warm and dry.   Cardiac: RRR, nl S1-S2, soft SEM.  Capillary refill is brisk.  JVP normal.  No LE edema.  Radial pulses 2+ and symmetric. Respiratory: Normal respiratory rate and rhythm.  CTAB without rales or wheezes. Abdomen: Abdomen soft without rigidity.  Diffuse mild TTP without guarding. No ascites, distension.  Many large old scars. MSK: No deformities or effusions. Neuro: Sensorium intact and responding to questions, attention normal.  Speech is fluent.  Moves all extremities equally and with normal coordination.    Psych: Behavior appropriate.  Affect  normal.  No evidence of aural or visual hallucinations or delusions.       Labs on Admission:  The metabolic panel shows normal electrolytes.  Chronic renal disease, stable. Transaminases and bilirubin are normal. The complete blood count shows mild leukopenia and anemia.   EKG: Independently reviewed. Rate 59, sinus, no STTW changes.    Assessment/Plan 1. Recurrent diverticular bleeding with dizziness:  This is a chronic not acute problem, for which the patient presents with >6 days bleeding, and new onset dizziness and lightheadedness.   -I recommend observation in med-surg bed given dizziness and gait instability -Check orthostatic VS -Fluid resuscitation -Repeat Hgb after fluids -If still dizzy after fluids, or if Hgb <7 g/dL after fluids, will transfuse, hold diet and consult GI, although if history is any guide, this may not be necessary   2. HTN:  Normotensive at admission. -Continue home losartan-HCTZ and metoprolol -Hold aspirin at admission, consider discontinuing permanently  3. GERD and hiatal hernia:  -Continue home PPI  DVT PPx: SCDs Diet: Regular Consultants: None Code Status: Full Family Communication: None  Medical decision making: What exists of the patient's previous chart was reviewed in depth and the case was discussed with Dr. Blinda Leatherwood. Patient seen 4:21 AM on 02/21/2015.  Disposition Plan:  Admit to med surg for observation of chronic diverticular bleeding, IV fluids and repeat Hgb.  If Hgb stable and dizziness resolved, home tomorrow.      Alberteen Sam [facility] Pager [phone]

## 2015-02-21 NOTE — Progress Notes (Signed)
[facility] PROGRESS NOTE   [name] ZOX:[id number] DOB: 1923-06-13 DOA: 02/21/2015 PCP: No primary care provider on file.  HPI/Subjective: No bloody bowel movements since yesterday.  Assessment/Plan: Principal Problem:   Lower GI bleed Active Problems:   History of GI diverticular bleed   Hypertension   Patient admitted earlier today by my colleague Dr. Maryfrances Bunnell, this is a no charge note. Patient seen and examined, and data base reviewed. Patient presented with painless rectal bleeding for about 7 days. Hemoglobin initially was 9.0, decreased to 8.6 after initiation of IV fluids. Check hemoglobin in AM  Code Status: Full Code Family Communication: Plan discussed with the patient. Disposition Plan: Remains inpatient Diet: Diet regular Room service appropriate?: Yes; Fluid consistency:: Thin  Consultants:  GI  Procedures:  None  Antibiotics:  None   Objective: Filed Vitals:   02/21/15 0815 02/21/15 0935  BP:  146/54  Pulse:  60  Temp: 98.2 F (36.8 C) 98 F (36.7 C)  Resp:  18    Intake/Output Summary (Last 24 hours) at 02/21/15 1408 Last data filed at 02/21/15 0900  Gross per 24 hour  Intake      0 ml  Output      0 ml  Net      0 ml   Filed Weights   02/21/15 0935  Weight: 95.2 kg (209 lb 14.1 oz)    Exam: General: Alert and awake, oriented x3, not in any acute distress. HEENT: anicteric sclera, pupils reactive to light and accommodation, EOMI CVS: S1-S2 clear, no murmur rubs or gallops Chest: clear to auscultation bilaterally, no wheezing, rales or rhonchi Abdomen: soft nontender, nondistended, normal bowel sounds, no organomegaly Extremities: no cyanosis, clubbing or edema noted bilaterally Neuro: Cranial nerves II-XII intact, no focal neurological deficits  Data Reviewed: Basic Metabolic Panel:  Recent Labs Lab 02/21/15 0210 02/21/15 1054  NA 141 141  K 4.3 3.8  CL 104 108  CO2 26 25  GLUCOSE 128* 88  BUN 17 14    CREATININE 1.40* 1.19*  CALCIUM 9.4 9.1   Liver Function Tests:  Recent Labs Lab 02/21/15 0210  AST 20  ALT 11*  ALKPHOS 61  BILITOT 0.3  PROT 6.1*  ALBUMIN 3.2*   No results for input(s): LIPASE, AMYLASE in the last 168 hours. No results for input(s): AMMONIA in the last 168 hours. CBC:  Recent Labs Lab 02/21/15 0210 02/21/15 1054  WBC 3.6* 3.3*  NEUTROABS 2.2  --   HGB 9.0* 8.6*  HCT 27.8* 26.6*  MCV 92.7 92.4  PLT 296 289   Cardiac Enzymes: No results for input(s): CKTOTAL, CKMB, CKMBINDEX, TROPONINI in the last 168 hours. BNP (last 3 results) No results for input(s): BNP in the last 8760 hours.  ProBNP (last 3 results) No results for input(s): PROBNP in the last 8760 hours.  CBG: No results for input(s): GLUCAP in the last 168 hours.  Micro No results found for this or any previous visit (from the past 240 hour(s)).   Studies: No results found.  Scheduled Meds: . ferrous sulfate  325 mg Oral Q breakfast  . losartan  100 mg Oral Daily   And  . hydrochlorothiazide  12.5 mg Oral Daily  . metoprolol tartrate  25 mg Oral BID  . pantoprazole  80 mg Oral Q1200   Continuous Infusions:      Time spent: 35 minutes    [facility]  [facility] Pager [phone] If 7PM-7AM, please contact night-coverage at [email or web address], password  [email or web address] 02/21/2015, 2:08 PM

## 2015-02-21 NOTE — ED Notes (Signed)
Admitting MD at bedside.

## 2015-02-21 NOTE — ED Notes (Signed)
Per EMS pt has had dark red stool everyday since January 2nd; Pt feels BM is more blood than actually stool; pt started getting weak; pt has hx of the same which lasted 3 days; pt has hx of blood transfusion, Symtoms started while at PCP;Pt denies pain on arrival; Pt a&ox 4 on arrival.

## 2015-02-21 NOTE — ED Notes (Signed)
MD at bedside. 

## 2015-02-21 NOTE — ED Notes (Addendum)
Pt has live bedbugs crawling on her; sample collected and taken to charge by EMT

## 2015-02-21 NOTE — ED Provider Notes (Addendum)
CSN: [id number]     Arrival date & time 02/21/15  0131 History   First MD Initiated Contact with Patient 02/21/15 0133     Chief Complaint  Patient presents with  . Rectal Bleeding     (Consider location/radiation/quality/duration/timing/severity/associated sxs/prior Treatment) HPI Comments: Patient presents to the emergency department for evaluation of rectal bleeding. Patient reports that she has had recurrent rectal bleeding in the past. She has required blood transfusions previously. Patient reports that symptoms began one week ago. She does report that sometimes she has bleeding for several days and then it stops, but this is longer than she is used to having bleeding. She has now started to develop mild weakness, dizziness and shortness of breath with ambulation. This concerns her that she may becoming anemic and may need blood transfusion. There is no chest pain. Patient reports dark red maroon rectal bleeding without pain.  Patient is a 82 y.o. female presenting with hematochezia.  Rectal Bleeding   Past Medical History  Diagnosis Date  . Diverticulitis   . History of GI diverticular bleed   . HTN (hypertension)   . High cholesterol   . Acid reflux    History reviewed. No pertinent past surgical history. History reviewed. No pertinent family history. Social History  Substance Use Topics  . Smoking status: Never Smoker   . Smokeless tobacco: Never Used  . Alcohol Use: No   OB History    No data available     Review of Systems  Gastrointestinal: Positive for blood in stool, hematochezia and anal bleeding.  All other systems reviewed and are negative.     Allergies  Ezetimibe; Cholestyramine; Lipitor; Pravachol; Statins; and Zocor  Home Medications   Prior to Admission medications   Medication Sig Start Date End Date Taking? Authorizing Provider  acetaminophen (TYLENOL) 325 MG tablet Take 650 mg by mouth every 6 (six) hours as needed. pain    Historical Provider,  MD  aspirin EC 81 MG tablet Take 81 mg by mouth daily.    Historical Provider, MD  cholecalciferol (VITAMIN D) 1000 UNITS tablet Take 2,000 Units by mouth daily.    Historical Provider, MD  ciprofloxacin-dexamethasone (CIPRODEX) otic suspension Place 2 drops into both ears daily as needed. For ear dryness and discomfort    Historical Provider, MD  esomeprazole (NEXIUM) 40 MG capsule Take 40 mg by mouth daily before breakfast.    Historical Provider, MD  ferrous sulfate 325 (65 FE) MG tablet Take 325 mg by mouth daily with breakfast.    Historical Provider, MD  guaiFENesin (MUCINEX) 600 MG 12 hr tablet Take 2 tablets (1,200 mg total) by mouth 2 (two) times daily. Patient not taking: Reported on 09/08/2014 11/17/12   Clydia Llano, MD  HYDROcodone-acetaminophen (VICODIN) 5-500 MG per tablet Take 1 tablet by mouth daily as needed. pain    Historical Provider, MD  levofloxacin (LEVAQUIN) 500 MG tablet Take 1 tablet (500 mg total) by mouth daily. Patient not taking: Reported on 09/08/2014 11/17/12   Clydia Llano, MD  losartan-hydrochlorothiazide (HYZAAR) 100-12.5 MG per tablet Take 1 tablet by mouth daily.    Historical Provider, MD  metoprolol tartrate (LOPRESSOR) 25 MG tablet Take 25 mg by mouth 2 (two) times daily.    Historical Provider, MD  Multiple Vitamin (MULITIVITAMIN WITH MINERALS) TABS Take 1 tablet by mouth daily.    Historical Provider, MD  nitroGLYCERIN (NITROSTAT) 0.4 MG SL tablet Place 0.4 mg under the tongue every 5 (five) minutes as needed.  Historical Provider, MD  Olopatadine HCl (PATADAY OP) Apply 1 drop to eye daily as needed. For allergy dryness    Historical Provider, MD  polyethylene glycol (MIRALAX / GLYCOLAX) packet Take 17 g by mouth daily as needed. constipation    Historical Provider, MD  potassium chloride (K-DUR) 10 MEQ tablet Take 10 mEq by mouth daily.    Historical Provider, MD   BP 121/57 mmHg  Pulse 59  Temp(Src) 98.5 F (36.9 C)  Resp 16  SpO2 100% Physical Exam   Constitutional: She is oriented to person, place, and time. She appears well-developed and well-nourished. No distress.  HENT:  Head: Normocephalic and atraumatic.  Right Ear: Hearing normal.  Left Ear: Hearing normal.  Nose: Nose normal.  Mouth/Throat: Oropharynx is clear and moist and mucous membranes are normal.  Eyes: Conjunctivae and EOM are normal. Pupils are equal, round, and reactive to light.  Neck: Normal range of motion. Neck supple.  Cardiovascular: Regular rhythm, S1 normal and S2 normal.  Exam reveals no gallop and no friction rub.   No murmur heard. Pulmonary/Chest: Effort normal and breath sounds normal. No respiratory distress. She exhibits no tenderness.  Abdominal: Soft. Normal appearance and bowel sounds are normal. There is no hepatosplenomegaly. There is no tenderness. There is no rebound, no guarding, no tenderness at McBurney's point and negative Murphy's sign. No hernia.  Genitourinary: Rectal exam shows no mass and anal tone normal. Guaiac positive stool (gross blood, dark/maroon).  Musculoskeletal: Normal range of motion.  Neurological: She is alert and oriented to person, place, and time. She has normal strength. No cranial nerve deficit or sensory deficit. Coordination normal. GCS eye subscore is 4. GCS verbal subscore is 5. GCS motor subscore is 6.  Skin: Skin is warm, dry and intact. No rash noted. No cyanosis.  Psychiatric: She has a normal mood and affect. Her speech is normal and behavior is normal. Thought content normal.  Nursing note and vitals reviewed.   ED Course  Procedures (including critical care time) Labs Review Labs Reviewed  CBC WITH DIFFERENTIAL/PLATELET - Abnormal; Notable for the following:    WBC 3.6 (*)    RBC 3.00 (*)    Hemoglobin 9.0 (*)    HCT 27.8 (*)    All other components within normal limits  COMPREHENSIVE METABOLIC PANEL - Abnormal; Notable for the following:    Glucose, Bld 128 (*)    Creatinine, Ser 1.40 (*)    Total  Protein 6.1 (*)    Albumin 3.2 (*)    ALT 11 (*)    GFR calc non Af Amer 32 (*)    GFR calc Af Amer 37 (*)    All other components within normal limits  PROTIME-INR  POC OCCULT BLOOD, ED  TYPE AND SCREEN    Imaging Review No results found. I have personally reviewed and evaluated these images and lab results as part of my medical decision-making.   EKG Interpretation None      ED ECG REPORT   Date: 02/21/2015  Rate: 89  Rhythm: normal sinus rhythm  QRS Axis: normal  Intervals: PR prolonged  ST/T Wave abnormalities: nonspecific T wave changes  Conduction Disutrbances:none  Narrative Interpretation:   Old EKG Reviewed: none available  I have personally reviewed the EKG tracing and agree with the computerized printout as noted.   MDM   Final diagnoses:  None   GI bleed  Patient presents to the ER for evaluation of one week of rectal bleeding. Patient does  have a history of diverticular bleed in the past. She has required blood transfusions previously. Patient reports that over the last 24-48 hours she has continued to bleed and has now noticed dizziness, weakness and shortness of breath with walking. She is not hypotensive or tachycardic here in the ER. CBC reveals hemoglobin of 9. Reviewing her records reveals that her baseline is 11. Based on her symptomatology and 2 g drop in hemoglobin, will have hospitalist to observe her overnight for repeat H&H to ensure that she does not drop further require transfusion.    Gilda Creasehristopher J Elizibeth Breau, MD 02/21/15 [id number]  Gilda Creasehristopher J Sylina Henion, MD 02/21/15 [phone]

## 2015-02-22 DIAGNOSIS — K922 Gastrointestinal hemorrhage, unspecified: Secondary | ICD-10-CM | POA: Diagnosis not present

## 2015-02-22 DIAGNOSIS — I10 Essential (primary) hypertension: Secondary | ICD-10-CM | POA: Diagnosis not present

## 2015-02-22 DIAGNOSIS — Z8719 Personal history of other diseases of the digestive system: Secondary | ICD-10-CM | POA: Diagnosis not present

## 2015-02-22 LAB — CBC
HCT: 24.1 % — ABNORMAL LOW (ref 36.0–46.0)
Hemoglobin: 7.7 g/dL — ABNORMAL LOW (ref 12.0–15.0)
MCH: 30 pg (ref 26.0–34.0)
MCHC: 32 g/dL (ref 30.0–36.0)
MCV: 93.8 fL (ref 78.0–100.0)
Platelets: 266 10*3/uL (ref 150–400)
RBC: 2.57 MIL/uL — ABNORMAL LOW (ref 3.87–5.11)
RDW: 15.1 % (ref 11.5–15.5)
WBC: 4.2 10*3/uL (ref 4.0–10.5)

## 2015-02-22 LAB — COMPREHENSIVE METABOLIC PANEL
ALT: 12 U/L — ABNORMAL LOW (ref 14–54)
AST: 19 U/L (ref 15–41)
Albumin: 2.7 g/dL — ABNORMAL LOW (ref 3.5–5.0)
Alkaline Phosphatase: 49 U/L (ref 38–126)
Anion gap: 7 (ref 5–15)
BUN: 18 mg/dL (ref 6–20)
CO2: 27 mmol/L (ref 22–32)
Calcium: 8.3 mg/dL — ABNORMAL LOW (ref 8.9–10.3)
Chloride: 108 mmol/L (ref 101–111)
Creatinine, Ser: 1.48 mg/dL — ABNORMAL HIGH (ref 0.44–1.00)
GFR calc Af Amer: 35 mL/min — ABNORMAL LOW (ref >60)
GFR calc non Af Amer: 30 mL/min — ABNORMAL LOW (ref >60)
Glucose, Bld: 109 mg/dL — ABNORMAL HIGH (ref 65–99)
Potassium: 4 mmol/L (ref 3.5–5.1)
Sodium: 142 mmol/L (ref 135–145)
Total Bilirubin: 0.3 mg/dL (ref 0.3–1.2)
Total Protein: 4.8 g/dL — ABNORMAL LOW (ref 6.5–8.1)

## 2015-02-22 LAB — PREPARE RBC (CROSSMATCH)

## 2015-02-22 MED ORDER — POLYETHYLENE GLYCOL 3350 17 G PO PACK
17.0000 g | PACK | Freq: Three times a day (TID) | ORAL | Status: DC
Start: 2015-02-22 — End: 2015-02-23
  Administered 2015-02-22 – 2015-02-23 (×2): 17 g via ORAL
  Filled 2015-02-22 (×2): qty 1

## 2015-02-22 MED ORDER — SODIUM CHLORIDE 0.9 % IV SOLN
Freq: Once | INTRAVENOUS | Status: AC
  Administered 2015-02-22: 12:00:00 via INTRAVENOUS

## 2015-02-22 NOTE — Progress Notes (Signed)
Consulted with Okey Regalarol from [facility], pt is being dc'd from [facility], Consultant says bed bugs only require standard precautions along with bath and full linen change. [employer] consulted says bugs found in room around window (per night NT) are drain flies, NOT bed bugs.

## 2015-02-22 NOTE — Consult Note (Signed)
[facility] Reason for consult: G.I. bleeding Referring Physician: [facility]. PCP: Dr.Avbuerre. Primary G.I.: Dr. Shelbie Proctor is an 82 y.o. female.  HPI: we have been seeing her for some time. She has had chronic problems with G.I. bleeding. 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 She also has had extensive diverticular disease. Very minimum 2012 showed lipoma in the cecum extensive diverticular disease. She also has had multiple EGD showing hiatal hernia. She's had quite a few episodes of G.I. bleeding assumed to be due to diverticulosis. She had a recent episode of bleeding started off with maroon stools subsequently turn dark with no significant abdominal pain. Her hemoglobin had been 11 and was down to 9 and she has been hemodynamically stable. Drop today to 7.7 and she is being transfused. She takes hydrocodone for her hip pain and denies taking any NSAIDs on a regular basis. She denies constipation with the regular use of Miralax.  Past Medical History  Diagnosis Date  . Diverticulitis   . History of GI diverticular bleed   . HTN (hypertension)   . High cholesterol   . Acid reflux     History reviewed. No pertinent past surgical history.  Family History  Problem Relation Age of Onset  . Diabetes Other   . Hypertension Other   . Cancer Other   . Heart disease Other     Social History:  reports that she has never smoked. She has never used smokeless tobacco. She reports that she does not drink alcohol or use illicit drugs.  Allergies:  Allergies  Allergen Reactions  . Ezetimibe Anaphylaxis  . Cholestyramine Nausea And Vomiting  . Lipitor [Atorvastatin] Swelling  . Pravachol [Pravastatin] Swelling  . Statins Swelling    Swelling of mouth and lips  . Zocor [Simvastatin]  Swelling    Medications; Prior to Admission medications   Medication Sig Start Date End Date Taking? Authorizing Provider  acetaminophen (TYLENOL) 325 MG tablet Take 650 mg by mouth every 6 (six) hours as needed. pain   Yes Historical Provider, MD  aspirin EC 81 MG tablet Take 81 mg by mouth daily.   Yes Historical Provider, MD  cholecalciferol (VITAMIN D) 1000 UNITS tablet Take 2,000 Units by mouth daily.   Yes Historical Provider, MD  ciprofloxacin-dexamethasone (CIPRODEX) otic suspension Place 2 drops into both ears daily as needed. For ear dryness and discomfort   Yes Historical Provider, MD  esomeprazole (NEXIUM) 40 MG capsule Take 40 mg by mouth daily before breakfast.   Yes Historical Provider, MD  ferrous sulfate 325 (65 FE) MG tablet Take 325 mg by mouth daily with breakfast.   Yes Historical Provider, MD  HYDROcodone-acetaminophen (VICODIN) 5-500 MG per tablet Take 1 tablet by mouth daily as needed. pain   Yes Historical Provider, MD  losartan-hydrochlorothiazide (HYZAAR) 100-12.5 MG per tablet Take 1 tablet by mouth daily.   Yes Historical Provider, MD  metoprolol tartrate (LOPRESSOR) 25 MG tablet Take 25 mg by mouth 2 (two) times daily.   Yes Historical Provider, MD  Multiple Vitamin (MULITIVITAMIN WITH MINERALS) TABS Take 1 tablet by mouth daily.   Yes Historical Provider, MD  nitroGLYCERIN (NITROSTAT) 0.4 MG SL tablet Place 0.4 mg under the tongue every 5 (five) minutes as needed.   Yes Historical Provider, MD  Olopatadine HCl (PATADAY OP) Apply  1 drop to eye daily as needed. For allergy dryness   Yes Historical Provider, MD  polyethylene glycol (MIRALAX / GLYCOLAX) packet Take 17 g by mouth daily as needed. constipation   Yes Historical Provider, MD  potassium chloride (K-DUR) 10 MEQ tablet Take 10 mEq by mouth daily.   Yes Historical Provider, MD   . losartan  100 mg Oral Daily   And  . hydrochlorothiazide  12.5 mg Oral Daily  . metoprolol tartrate  25 mg Oral BID  .  pantoprazole  80 mg Oral Q1200   PRN Meds HYDROcodone-acetaminophen Results for orders placed or performed during the hospital encounter of 02/21/15 (from the past 48 hour(s))  CBC with Differential/Platelet     Status: Abnormal   Collection Time: 02/21/15  2:10 AM  Result Value Ref Range   WBC 3.6 (L) 4.0 - 10.5 K/uL   RBC 3.00 (L) 3.87 - 5.11 MIL/uL   Hemoglobin 9.0 (L) 12.0 - 15.0 g/dL   HCT 27.8 (L) 36.0 - 46.0 %   MCV 92.7 78.0 - 100.0 fL   MCH 30.0 26.0 - 34.0 pg   MCHC 32.4 30.0 - 36.0 g/dL   RDW 14.9 11.5 - 15.5 %   Platelets 296 150 - 400 K/uL   Neutrophils Relative % 62 %   Neutro Abs 2.2 1.7 - 7.7 K/uL   Lymphocytes Relative 25 %   Lymphs Abs 0.9 0.7 - 4.0 K/uL   Monocytes Relative 10 %   Monocytes Absolute 0.4 0.1 - 1.0 K/uL   Eosinophils Relative 3 %   Eosinophils Absolute 0.1 0.0 - 0.7 K/uL   Basophils Relative 0 %   Basophils Absolute 0.0 0.0 - 0.1 K/uL  Comprehensive metabolic panel     Status: Abnormal   Collection Time: 02/21/15  2:10 AM  Result Value Ref Range   Sodium 141 135 - 145 mmol/L   Potassium 4.3 3.5 - 5.1 mmol/L   Chloride 104 101 - 111 mmol/L   CO2 26 22 - 32 mmol/L   Glucose, Bld 128 (H) 65 - 99 mg/dL   BUN 17 6 - 20 mg/dL   Creatinine, Ser 1.40 (H) 0.44 - 1.00 mg/dL   Calcium 9.4 8.9 - 10.3 mg/dL   Total Protein 6.1 (L) 6.5 - 8.1 g/dL   Albumin 3.2 (L) 3.5 - 5.0 g/dL   AST 20 15 - 41 U/L   ALT 11 (L) 14 - 54 U/L   Alkaline Phosphatase 61 38 - 126 U/L   Total Bilirubin 0.3 0.3 - 1.2 mg/dL   GFR calc non Af Amer 32 (L) >60 mL/min   GFR calc Af Amer 37 (L) >60 mL/min    Comment: (NOTE) The eGFR has been calculated using the CKD EPI equation. This calculation has not been validated in all clinical situations. eGFR's persistently <60 mL/min signify possible Chronic Kidney Disease.    Anion gap 11 5 - 15  Protime-INR     Status: None   Collection Time: 02/21/15  2:10 AM  Result Value Ref Range   Prothrombin Time 15.2 11.6 - 15.2  seconds   INR 1.18 0.00 - 1.49  Type and screen     Status: None (Preliminary result)   Collection Time: 02/21/15  2:20 AM  Result Value Ref Range   ABO/RH(D) B POS    Antibody Screen NEG    Sample Expiration 02/24/2015    Unit Number [id number]    Blood Component Type RED CELLS,LR    Unit division  00    Status of Unit ISSUED    Transfusion Status OK TO TRANSFUSE    Crossmatch Result Compatible   POC occult blood, ED Provider will collect     Status: Abnormal   Collection Time: 02/21/15  3:10 AM  Result Value Ref Range   Fecal Occult Bld POSITIVE (A) NEGATIVE  CBC     Status: Abnormal   Collection Time: 02/21/15 10:54 AM  Result Value Ref Range   WBC 3.3 (L) 4.0 - 10.5 K/uL   RBC 2.88 (L) 3.87 - 5.11 MIL/uL   Hemoglobin 8.6 (L) 12.0 - 15.0 g/dL   HCT 26.6 (L) 36.0 - 46.0 %   MCV 92.4 78.0 - 100.0 fL   MCH 29.9 26.0 - 34.0 pg   MCHC 32.3 30.0 - 36.0 g/dL   RDW 14.9 11.5 - 15.5 %   Platelets 289 150 - 400 K/uL  Basic metabolic panel     Status: Abnormal   Collection Time: 02/21/15 10:54 AM  Result Value Ref Range   Sodium 141 135 - 145 mmol/L   Potassium 3.8 3.5 - 5.1 mmol/L   Chloride 108 101 - 111 mmol/L   CO2 25 22 - 32 mmol/L   Glucose, Bld 88 65 - 99 mg/dL   BUN 14 6 - 20 mg/dL   Creatinine, Ser 1.19 (H) 0.44 - 1.00 mg/dL   Calcium 9.1 8.9 - 10.3 mg/dL   GFR calc non Af Amer 39 (L) >60 mL/min   GFR calc Af Amer 46 (L) >60 mL/min    Comment: (NOTE) The eGFR has been calculated using the CKD EPI equation. This calculation has not been validated in all clinical situations. eGFR's persistently <60 mL/min signify possible Chronic Kidney Disease.    Anion gap 8 5 - 15  CBC     Status: Abnormal   Collection Time: 02/22/15  5:44 AM  Result Value Ref Range   WBC 4.2 4.0 - 10.5 K/uL   RBC 2.57 (L) 3.87 - 5.11 MIL/uL   Hemoglobin 7.7 (L) 12.0 - 15.0 g/dL   HCT 24.1 (L) 36.0 - 46.0 %   MCV 93.8 78.0 - 100.0 fL   MCH 30.0 26.0 - 34.0 pg   MCHC 32.0 30.0 - 36.0 g/dL    RDW 15.1 11.5 - 15.5 %   Platelets 266 150 - 400 K/uL  Comprehensive metabolic panel     Status: Abnormal   Collection Time: 02/22/15  5:44 AM  Result Value Ref Range   Sodium 142 135 - 145 mmol/L   Potassium 4.0 3.5 - 5.1 mmol/L   Chloride 108 101 - 111 mmol/L   CO2 27 22 - 32 mmol/L   Glucose, Bld 109 (H) 65 - 99 mg/dL   BUN 18 6 - 20 mg/dL   Creatinine, Ser 1.48 (H) 0.44 - 1.00 mg/dL   Calcium 8.3 (L) 8.9 - 10.3 mg/dL   Total Protein 4.8 (L) 6.5 - 8.1 g/dL   Albumin 2.7 (L) 3.5 - 5.0 g/dL   AST 19 15 - 41 U/L   ALT 12 (L) 14 - 54 U/L   Alkaline Phosphatase 49 38 - 126 U/L   Total Bilirubin 0.3 0.3 - 1.2 mg/dL   GFR calc non Af Amer 30 (L) >60 mL/min   GFR calc Af Amer 35 (L) >60 mL/min    Comment: (NOTE) The eGFR has been calculated using the CKD EPI equation. This calculation has not been validated in all clinical situations. eGFR's persistently <60 mL/min  signify possible Chronic Kidney Disease.    Anion gap 7 5 - 15  Prepare RBC     Status: None   Collection Time: 02/22/15 12:05 PM  Result Value Ref Range   Order Confirmation ORDER PROCESSED BY BLOOD BANK     No results found.             Blood pressure 109/76, pulse 84, temperature 98.5 F (36.9 C), temperature source Oral, resp. rate 18, height 5' 2.5" (1.588 m), weight 95.2 kg (209 lb 14.1 oz), SpO2 100 %.  Physical exam:   General-- pleasant African-American female no acute distress ENT-- nonicteric Neck-- the lymphadenopathy Heart-- regular rate and rhythm without murmurs or gallops Lungs-- clear Abdomen-- soft and nontender nondistended Psych-- alert and oriented answers questions appropriately appears younger than her stated age   Assessment: 1. Recurrent G.I. bleeding. This is probably diverticular but it's been sometime since she has been evaluated. I think given the degree of bleeding this should be repeated  Plan: 1. We will plan EGD in the morning time still to be determined. We  will likely schedule colonoscopy with the ultrathin colonoscope which is available here in the hospital due to the problems passing larger colonoscope's due to her severe diverticular disease. Have discussed this plan with the patient she is agreeable 2. Would transfuse as you are 3. Will go ahead and start on Miralax.   Milbert Bixler JR,Braydan Marriott L 02/22/2015, 4:36 PM   Pager: [phone] If no answer or after hours call [phone]

## 2015-02-22 NOTE — Progress Notes (Signed)
[facility] PROGRESS NOTE   Brenda GladeBertha C Bagent [id number] DOB: 06/11/1923 DOA: 02/21/2015 PCP: No primary care provider on file.  HPI/Subjective: No bloody bowel movements since yesterday.  Assessment/Plan: Principal Problem:   Lower GI bleed Active Problems:   History of GI diverticular bleed   Hypertension   Recurrent diverticular bleeding with dizziness:  -Passing bright red blood for 6 days prior to admission, and now developed new onset dizziness and lightheadedness.  -I recommend observation in med-surg bed given dizziness and gait instability -Still has rectal bleeding with bowel movement, keep on clear liquids. -I'll notify GI that patient in the hospital.  Anemia -Acute blood loss anemia secondary to GI bleed. -Patient has chronic anemia with baseline of 11 from July 2016. Patient presented with symptoms of anemia with lightheadedness. -Presented with hemoglobin of 9.0, hemoglobin down to 7.7, I will transfuse 1 unit of packed RBCs.  HTN:  -Normotensive at admission. -Continue home losartan-HCTZ and metoprolol -Hold aspirin at admission, consider discontinuing permanently  GERD and hiatal hernia:  -Continue home PPI   Code Status: Full Code Family Communication: Plan discussed with the patient. Disposition Plan: Remains inpatient Diet: Diet clear liquid Room service appropriate?: Yes; Fluid consistency:: Thin  Consultants:  GI  Procedures:  None  Antibiotics:  None   Objective: Filed Vitals:   02/22/15 0633 02/22/15 0902  BP: 101/46 124/60  Pulse: 58   Temp: 98.4 F (36.9 C)   Resp: 20     Intake/Output Summary (Last 24 hours) at 02/22/15 1144 Last data filed at 02/22/15 0634  Gross per 24 hour  Intake      0 ml  Output    400 ml  Net   -400 ml   Filed Weights   02/21/15 0935  Weight: 95.2 kg (209 lb 14.1 oz)    Exam: General: Alert and awake, oriented x3, not in any acute distress. HEENT: anicteric sclera, pupils  reactive to light and accommodation, EOMI CVS: S1-S2 clear, no murmur rubs or gallops Chest: clear to auscultation bilaterally, no wheezing, rales or rhonchi Abdomen: soft nontender, nondistended, normal bowel sounds, no organomegaly Extremities: no cyanosis, clubbing or edema noted bilaterally Neuro: Cranial nerves II-XII intact, no focal neurological deficits  Data Reviewed: Basic Metabolic Panel:  Recent Labs Lab 02/21/15 0210 02/21/15 1054 02/22/15 0544  NA 141 141 142  K 4.3 3.8 4.0  CL 104 108 108  CO2 26 25 27   GLUCOSE 128* 88 109*  BUN 17 14 18   CREATININE 1.40* 1.19* 1.48*  CALCIUM 9.4 9.1 8.3*   Liver Function Tests:  Recent Labs Lab 02/21/15 0210 02/22/15 0544  AST 20 19  ALT 11* 12*  ALKPHOS 61 49  BILITOT 0.3 0.3  PROT 6.1* 4.8*  ALBUMIN 3.2* 2.7*   No results for input(s): LIPASE, AMYLASE in the last 168 hours. No results for input(s): AMMONIA in the last 168 hours. CBC:  Recent Labs Lab 02/21/15 0210 02/21/15 1054 02/22/15 0544  WBC 3.6* 3.3* 4.2  NEUTROABS 2.2  --   --   HGB 9.0* 8.6* 7.7*  HCT 27.8* 26.6* 24.1*  MCV 92.7 92.4 93.8  PLT 296 289 266   Cardiac Enzymes: No results for input(s): CKTOTAL, CKMB, CKMBINDEX, TROPONINI in the last 168 hours. BNP (last 3 results) No results for input(s): BNP in the last 8760 hours.  ProBNP (last 3 results) No results for input(s): PROBNP in the last 8760 hours.  CBG: No results for input(s): GLUCAP in the last 168 hours.  Micro No results found for this or any previous visit (from the past 240 hour(s)).   Studies: No results found.  Scheduled Meds: . ferrous sulfate  325 mg Oral Q breakfast  . losartan  100 mg Oral Daily   And  . hydrochlorothiazide  12.5 mg Oral Daily  . metoprolol tartrate  25 mg Oral BID  . pantoprazole  80 mg Oral Q1200   Continuous Infusions:      Time spent: 35 minutes    [facility] A  [facility] Pager [phone] If 7PM-7AM, please contact  night-coverage at [email or web address], password [facility] 02/22/2015, 11:44 AM

## 2015-02-22 NOTE — Progress Notes (Signed)
Pt refuses SCDs for VTE prophylaxis, MD made aware. No new orders obtained. Will continue to monitor pt for signs and symptoms of worsening condition. Will ambulate pt routinely, as tolerated.   Ok AnisSanders,Sirron Francesconi A, RN 02/22/2015 8:19 AM

## 2015-02-23 ENCOUNTER — Observation Stay (HOSPITAL_COMMUNITY): Payer: Medicare Other | Admitting: Anesthesiology

## 2015-02-23 ENCOUNTER — Encounter (HOSPITAL_COMMUNITY): Payer: Self-pay

## 2015-02-23 ENCOUNTER — Encounter (HOSPITAL_COMMUNITY)
Admission: EM | Disposition: A | Discharge status: Home / Self Care | Payer: Self-pay | Source: Home / Self Care | Attending: Emergency Medicine

## 2015-02-23 DIAGNOSIS — I10 Essential (primary) hypertension: Secondary | ICD-10-CM

## 2015-02-23 DIAGNOSIS — K922 Gastrointestinal hemorrhage, unspecified: Secondary | ICD-10-CM | POA: Diagnosis not present

## 2015-02-23 DIAGNOSIS — K259 Gastric ulcer, unspecified as acute or chronic, without hemorrhage or perforation: Secondary | ICD-10-CM | POA: Diagnosis not present

## 2015-02-23 DIAGNOSIS — K5791 Diverticulosis of intestine, part unspecified, without perforation or abscess with bleeding: Secondary | ICD-10-CM | POA: Diagnosis not present

## 2015-02-23 HISTORY — PX: ESOPHAGOGASTRODUODENOSCOPY: SHX5428

## 2015-02-23 LAB — BASIC METABOLIC PANEL
Anion gap: 9 (ref 5–15)
BUN: 14 mg/dL (ref 6–20)
CO2: 23 mmol/L (ref 22–32)
Calcium: 8.6 mg/dL — ABNORMAL LOW (ref 8.9–10.3)
Chloride: 107 mmol/L (ref 101–111)
Creatinine, Ser: 1.19 mg/dL — ABNORMAL HIGH (ref 0.44–1.00)
GFR calc Af Amer: 46 mL/min — ABNORMAL LOW (ref >60)
GFR calc non Af Amer: 39 mL/min — ABNORMAL LOW (ref >60)
Glucose, Bld: 88 mg/dL (ref 65–99)
Potassium: 4.2 mmol/L (ref 3.5–5.1)
Sodium: 139 mmol/L (ref 135–145)

## 2015-02-23 LAB — TYPE AND SCREEN
ABO/RH(D): B POS
Antibody Screen: NEGATIVE
Unit division: 0

## 2015-02-23 LAB — CBC
HCT: 27.6 % — ABNORMAL LOW (ref 36.0–46.0)
Hemoglobin: 8.9 g/dL — ABNORMAL LOW (ref 12.0–15.0)
MCH: 28.7 pg (ref 26.0–34.0)
MCHC: 32.2 g/dL (ref 30.0–36.0)
MCV: 89 fL (ref 78.0–100.0)
Platelets: 282 10*3/uL (ref 150–400)
RBC: 3.1 MIL/uL — ABNORMAL LOW (ref 3.87–5.11)
RDW: 18.2 % — ABNORMAL HIGH (ref 11.5–15.5)
WBC: 4.1 10*3/uL (ref 4.0–10.5)

## 2015-02-23 SURGERY — EGD (ESOPHAGOGASTRODUODENOSCOPY)
Anesthesia: Monitor Anesthesia Care

## 2015-02-23 MED ORDER — PROPOFOL 10 MG/ML IV BOLUS
INTRAVENOUS | Status: DC | PRN
  Administered 2015-02-23 (×2): 20 mg via INTRAVENOUS

## 2015-02-23 MED ORDER — BUTAMBEN-TETRACAINE-BENZOCAINE 2-2-14 % EX AERO
INHALATION_SPRAY | CUTANEOUS | Status: DC | PRN
  Administered 2015-02-23: 2 via TOPICAL

## 2015-02-23 MED ORDER — LACTATED RINGERS IV SOLN
INTRAVENOUS | Status: DC | PRN
  Administered 2015-02-23: 12:00:00 via INTRAVENOUS

## 2015-02-23 MED ORDER — SODIUM CHLORIDE 0.9 % IV SOLN
INTRAVENOUS | Status: DC
  Administered 2015-02-24: 500 mL via INTRAVENOUS

## 2015-02-23 MED ORDER — PROPOFOL 500 MG/50ML IV EMUL
INTRAVENOUS | Status: DC | PRN
  Administered 2015-02-23: 50 ug/kg/min via INTRAVENOUS

## 2015-02-23 MED ORDER — PEG 3350-KCL-NA BICARB-NACL 420 G PO SOLR
4000.0000 mL | Freq: Once | ORAL | Status: AC
  Administered 2015-02-23: 4000 mL via ORAL
  Filled 2015-02-23: qty 4000

## 2015-02-23 NOTE — Transfer of Care (Signed)
Immediate Anesthesia Transfer of Care Note  Patient: [name]  Procedure(s) Performed: Procedure(s): ESOPHAGOGASTRODUODENOSCOPY (EGD) (N/A)  Patient Location: PACU  Anesthesia Type:MAC  Level of Consciousness: awake and alert   Airway & Oxygen Therapy: Patient Spontanous Breathing  Post-op Assessment: Report given to RN  Post vital signs: Reviewed  Last Vitals:  Filed Vitals:   02/23/15 1210 02/23/15 1220  BP: 117/42 119/34  Pulse: 52 55  Temp:    Resp: 15 14    Complications: No apparent anesthesia complications

## 2015-02-23 NOTE — Interval H&P Note (Signed)
History and Physical Interval Note:  02/23/2015 11:39 AM  [name]  has presented today for surgery, with the diagnosis of GI bleed  The various methods of treatment have been discussed with the patient and family. After consideration of risks, benefits and other options for treatment, the patient has consented to  Procedure(s): ESOPHAGOGASTRODUODENOSCOPY (EGD) (N/A) as a surgical intervention .  The patient's history has been reviewed, patient examined, no change in status, stable for surgery.  I have reviewed the patient's chart and labs.  Questions were answered to the patient's satisfaction.     Yossef Gilkison JR,Trellis Vanoverbeke L

## 2015-02-23 NOTE — Progress Notes (Signed)
PROGRESS NOTE  [name] ZOX:[id number] DOB: [birth year] DOA: 02/21/2015 PCP: No primary care provider on file.  HPI/Recap of past 25 hours:  82 year old female past medical history significant for hypertension and recurrent diverticular bleeding admitted on 1/9 for 7 days of black tarry stool. Patient's baseline hemoglobin around 11 and initially admitted with hemoglobin of 9 but by following day had come down to 7.7. Patient transfused 1 unit packed red blood cells.  Seen by GI and patient underwent EGD on 1/11 noting only some old gastric ulceration, but no significant bleeding noted.  Patient seen status post EGD. No complaints. No abdominal pain.  Assessment/Plan: Principal Problem:   Lower GI bleed: No significant findings per EGD. For colonoscopy in the morning Active Problems:   History of GI diverticular bleed   Hypertension: Blood pressure stable.   Code Status: Full code  Family Communication: Sister at the bedside   Disposition Plan: Potential discharge tomorrow colonoscopy negative    Consultants:  Gastroenterology   Procedures:  Status post EGD done 1/11: Several small old gastric ulcerations, hiatal hernia   Antibiotics:  None   Objective: BP 119/34 mmHg  Pulse 55  Temp(Src) 98.3 F (36.8 C) (Oral)  Resp 14  Ht 5' 2.5" (1.588 m)  Wt 95.4 kg (210 lb 5.1 oz)  BMI 37.83 kg/m2  SpO2 99%  Intake/Output Summary (Last 24 hours) at 02/23/15 1638 Last data filed at 02/23/15 0900  Gross per 24 hour  Intake      0 ml  Output      0 ml  Net      0 ml   Filed Weights   02/21/15 0935 02/22/15 2156  Weight: 95.2 kg (209 lb 14.1 oz) 95.4 kg (210 lb 5.1 oz)    Exam:   General:  Alert and oriented 3, no acute distress   Cardiovascular: Regular rate and rhythm, S1-S2, 2/6 systolic ejection murmur   Respiratory: Clear to auscultation bilaterally   Abdomen: Soft, nontender, nondistended, hyperactive bowel sounds  Musculoskeletal: No clubbing or  cyanosis or edema  Data Reviewed: Basic Metabolic Panel:  Recent Labs Lab 02/21/15 0210 02/21/15 1054 02/22/15 0544 02/23/15 0503  NA 141 141 142 139  K 4.3 3.8 4.0 4.2  CL 104 108 108 107  CO2 26 25 27 23   GLUCOSE 128* 88 109* 88  BUN 17 14 18 14   CREATININE 1.40* 1.19* 1.48* 1.19*  CALCIUM 9.4 9.1 8.3* 8.6*   Liver Function Tests:  Recent Labs Lab 02/21/15 0210 02/22/15 0544  AST 20 19  ALT 11* 12*  ALKPHOS 61 49  BILITOT 0.3 0.3  PROT 6.1* 4.8*  ALBUMIN 3.2* 2.7*   No results for input(s): LIPASE, AMYLASE in the last 168 hours. No results for input(s): AMMONIA in the last 168 hours. CBC:  Recent Labs Lab 02/21/15 0210 02/21/15 1054 02/22/15 0544 02/23/15 0503  WBC 3.6* 3.3* 4.2 4.1  NEUTROABS 2.2  --   --   --   HGB 9.0* 8.6* 7.7* 8.9*  HCT 27.8* 26.6* 24.1* 27.6*  MCV 92.7 92.4 93.8 89.0  PLT 296 289 266 282   Cardiac Enzymes:   No results for input(s): CKTOTAL, CKMB, CKMBINDEX, TROPONINI in the last 168 hours. BNP (last 3 results) No results for input(s): BNP in the last 8760 hours.  ProBNP (last 3 results) No results for input(s): PROBNP in the last 8760 hours.  CBG: No results for input(s): GLUCAP in the last 168 hours.  No results found  for this or any previous visit (from the past 240 hour(s)).   Studies: No results found.  Scheduled Meds: . losartan  100 mg Oral Daily   And  . hydrochlorothiazide  12.5 mg Oral Daily  . metoprolol tartrate  25 mg Oral BID  . pantoprazole  80 mg Oral Q1200  . polyethylene glycol-electrolytes  4,000 mL Oral Once    Continuous Infusions: . sodium chloride       Time spent: 15 minutes   Hollice EspyKRISHNAN,Jaydrian Corpening K  [facility] Pager [phone] . If 7PM-7AM, please contact night-coverage at [email or web address], password [email or web address] 02/23/2015, 4:38 PM

## 2015-02-23 NOTE — Op Note (Signed)
[facility] [address] [place], [id number]   ENDOSCOPY PROCEDURE REPORT  PATIENT: [name], [name]  MR#: [id number] BIRTHDATE: [birth year] , 89  yrs. old GENDER: female ENDOSCOPIST:Shenouda Genova Randa EvensEdwards, MD REFERRED BY: [facility]. PCP: Dr. Concepcion ElkAvbuere PROCEDURE DATE:  02/23/2015 PROCEDURE:   EGD and biopsy ASA CLASS:    class III INDICATIONS: anemia and positive stool MEDICATION: propofol 185 mg IV TOPICAL ANESTHETIC:   cetacaine spray  DESCRIPTION OF PROCEDURE:   After the risks and benefits of the procedure were explained, informed consent was obtained.  The Pentax Gastroscope [id number]  endoscope was introduced through the mouth  and advanced to the second portion of the duodenum .  The instrument was slowly withdrawn as the mucosa was fully examined. Estimated blood loss is zero unless otherwise noted in this procedure report. The 2nd duodenum and duodenal bulb are normal. The pyloric channel in [place] were normal. The patient had a very large hiatal hernia in the diaphragm was approximately 42 cm with a Z line approximately 30 cm. In the body of the stomach was very small erosion/ulcerations or biopsied. These were near the diaphragm. They were very soft and localized. The distal and proximal esophagus were endoscopically normal other than the large hiatal hernia.    The scope was then withdrawn from the patient and the procedure completed.  COMPLICATIONS: There were no immediate complications.  ENDOSCOPIC IMPRESSION: 1. Small Gastric Ulceration/Erosions. This could clearly be the cause of her positive stools and chronic anemia and may be a result of her large hiatal hernia. 2. Large Hiatal Hernia RECOMMENDATIONS: 1.  we will go ahead and attentive colonoscopy tomorrow with the ultrathin colonoscope. The patient has not had a colonoscopy in some time in her symptoms to suggest more of a lower G.I. bleed. 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 I have discussed this with her will try to get this range while she is here.   _______________________________ eSigned:  Carman ChingJames Adeel Guiffre, MD 02/23/2015 12:19 PM   cc: Dr Concepcion ElkAvbuere  cc:  CPT CODES: ICD CODES:  The ICD and CPT codes recommended by this software are interpretations from the data that the clinical staff has captured with the software.  The verification of the translation of this report to the ICD and CPT codes and modifiers is the sole responsibility of the health care institution and practicing physician where this report was generated.  [employer] will not be held responsible for the validity of the ICD and CPT codes included on this report.  AMA assumes no liability for data contained or not contained herein. CPT is a Publishing rights managerregistered trademark of the [employer].  PATIENT NAME:  [name], [name] MR#: [id number]

## 2015-02-23 NOTE — Anesthesia Postprocedure Evaluation (Signed)
Anesthesia Post Note  Patient: [name]  Procedure(s) Performed: Procedure(s) (LRB): ESOPHAGOGASTRODUODENOSCOPY (EGD) (N/A)  Patient location during evaluation: PACU Anesthesia Type: MAC Level of consciousness: awake and alert Pain management: pain level controlled Vital Signs Assessment: post-procedure vital signs reviewed and stable Respiratory status: spontaneous breathing Cardiovascular status: blood pressure returned to baseline Anesthetic complications: no    Last Vitals:  Filed Vitals:   02/23/15 1210 02/23/15 1220  BP: 117/42 119/34  Pulse: 52 55  Temp:    Resp: 15 14    Last Pain:  Filed Vitals:   02/23/15 1222  PainSc: 0-No pain                 Kennieth RadFitzgerald, [place]ult[place] Merry E

## 2015-02-23 NOTE — H&P (View-Only) (Signed)
[facility] Reason for consult: G.I. bleeding Referring Physician: [facility]. PCP: Dr.Avbuerre. Primary G.I.: Dr. Shelbie Proctor is an 82 y.o. female.  HPI: we have been seeing her for some time. She has had chronic problems with G.I. bleeding. 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 She also has had extensive diverticular disease. Very minimum 2012 showed lipoma in the cecum extensive diverticular disease. She also has had multiple EGD showing hiatal hernia. She's had quite a few episodes of G.I. bleeding assumed to be due to diverticulosis. She had a recent episode of bleeding started off with maroon stools subsequently turn dark with no significant abdominal pain. Her hemoglobin had been 11 and was down to 9 and she has been hemodynamically stable. Drop today to 7.7 and she is being transfused. She takes hydrocodone for her hip pain and denies taking any NSAIDs on a regular basis. She denies constipation with the regular use of Miralax.  Past Medical History  Diagnosis Date  . Diverticulitis   . History of GI diverticular bleed   . HTN (hypertension)   . High cholesterol   . Acid reflux     History reviewed. No pertinent past surgical history.  Family History  Problem Relation Age of Onset  . Diabetes Other   . Hypertension Other   . Cancer Other   . Heart disease Other     Social History:  reports that she has never smoked. She has never used smokeless tobacco. She reports that she does not drink alcohol or use illicit drugs.  Allergies:  Allergies  Allergen Reactions  . Ezetimibe Anaphylaxis  . Cholestyramine Nausea And Vomiting  . Lipitor [Atorvastatin] Swelling  . Pravachol [Pravastatin] Swelling  . Statins Swelling    Swelling of mouth and lips  . Zocor [Simvastatin]  Swelling    Medications; Prior to Admission medications   Medication Sig Start Date End Date Taking? Authorizing Provider  acetaminophen (TYLENOL) 325 MG tablet Take 650 mg by mouth every 6 (six) hours as needed. pain   Yes Historical Provider, MD  aspirin EC 81 MG tablet Take 81 mg by mouth daily.   Yes Historical Provider, MD  cholecalciferol (VITAMIN D) 1000 UNITS tablet Take 2,000 Units by mouth daily.   Yes Historical Provider, MD  ciprofloxacin-dexamethasone (CIPRODEX) otic suspension Place 2 drops into both ears daily as needed. For ear dryness and discomfort   Yes Historical Provider, MD  esomeprazole (NEXIUM) 40 MG capsule Take 40 mg by mouth daily before breakfast.   Yes Historical Provider, MD  ferrous sulfate 325 (65 FE) MG tablet Take 325 mg by mouth daily with breakfast.   Yes Historical Provider, MD  HYDROcodone-acetaminophen (VICODIN) 5-500 MG per tablet Take 1 tablet by mouth daily as needed. pain   Yes Historical Provider, MD  losartan-hydrochlorothiazide (HYZAAR) 100-12.5 MG per tablet Take 1 tablet by mouth daily.   Yes Historical Provider, MD  metoprolol tartrate (LOPRESSOR) 25 MG tablet Take 25 mg by mouth 2 (two) times daily.   Yes Historical Provider, MD  Multiple Vitamin (MULITIVITAMIN WITH MINERALS) TABS Take 1 tablet by mouth daily.   Yes Historical Provider, MD  nitroGLYCERIN (NITROSTAT) 0.4 MG SL tablet Place 0.4 mg under the tongue every 5 (five) minutes as needed.   Yes Historical Provider, MD  Olopatadine HCl (PATADAY OP) Apply  1 drop to eye daily as needed. For allergy dryness   Yes Historical Provider, MD  polyethylene glycol (MIRALAX / GLYCOLAX) packet Take 17 g by mouth daily as needed. constipation   Yes Historical Provider, MD  potassium chloride (K-DUR) 10 MEQ tablet Take 10 mEq by mouth daily.   Yes Historical Provider, MD   . losartan  100 mg Oral Daily   And  . hydrochlorothiazide  12.5 mg Oral Daily  . metoprolol tartrate  25 mg Oral BID  .  pantoprazole  80 mg Oral Q1200   PRN Meds HYDROcodone-acetaminophen Results for orders placed or performed during the hospital encounter of 02/21/15 (from the past 48 hour(s))  CBC with Differential/Platelet     Status: Abnormal   Collection Time: 02/21/15  2:10 AM  Result Value Ref Range   WBC 3.6 (L) 4.0 - 10.5 K/uL   RBC 3.00 (L) 3.87 - 5.11 MIL/uL   Hemoglobin 9.0 (L) 12.0 - 15.0 g/dL   HCT 27.8 (L) 36.0 - 46.0 %   MCV 92.7 78.0 - 100.0 fL   MCH 30.0 26.0 - 34.0 pg   MCHC 32.4 30.0 - 36.0 g/dL   RDW 14.9 11.5 - 15.5 %   Platelets 296 150 - 400 K/uL   Neutrophils Relative % 62 %   Neutro Abs 2.2 1.7 - 7.7 K/uL   Lymphocytes Relative 25 %   Lymphs Abs 0.9 0.7 - 4.0 K/uL   Monocytes Relative 10 %   Monocytes Absolute 0.4 0.1 - 1.0 K/uL   Eosinophils Relative 3 %   Eosinophils Absolute 0.1 0.0 - 0.7 K/uL   Basophils Relative 0 %   Basophils Absolute 0.0 0.0 - 0.1 K/uL  Comprehensive metabolic panel     Status: Abnormal   Collection Time: 02/21/15  2:10 AM  Result Value Ref Range   Sodium 141 135 - 145 mmol/L   Potassium 4.3 3.5 - 5.1 mmol/L   Chloride 104 101 - 111 mmol/L   CO2 26 22 - 32 mmol/L   Glucose, Bld 128 (H) 65 - 99 mg/dL   BUN 17 6 - 20 mg/dL   Creatinine, Ser 1.40 (H) 0.44 - 1.00 mg/dL   Calcium 9.4 8.9 - 10.3 mg/dL   Total Protein 6.1 (L) 6.5 - 8.1 g/dL   Albumin 3.2 (L) 3.5 - 5.0 g/dL   AST 20 15 - 41 U/L   ALT 11 (L) 14 - 54 U/L   Alkaline Phosphatase 61 38 - 126 U/L   Total Bilirubin 0.3 0.3 - 1.2 mg/dL   GFR calc non Af Amer 32 (L) >60 mL/min   GFR calc Af Amer 37 (L) >60 mL/min    Comment: (NOTE) The eGFR has been calculated using the CKD EPI equation. This calculation has not been validated in all clinical situations. eGFR's persistently <60 mL/min signify possible Chronic Kidney Disease.    Anion gap 11 5 - 15  Protime-INR     Status: None   Collection Time: 02/21/15  2:10 AM  Result Value Ref Range   Prothrombin Time 15.2 11.6 - 15.2  seconds   INR 1.18 0.00 - 1.49  Type and screen     Status: None (Preliminary result)   Collection Time: 02/21/15  2:20 AM  Result Value Ref Range   ABO/RH(D) B POS    Antibody Screen NEG    Sample Expiration 02/24/2015    Unit Number [id number]    Blood Component Type RED CELLS,LR    Unit division  00    Status of Unit ISSUED    Transfusion Status OK TO TRANSFUSE    Crossmatch Result Compatible   POC occult blood, ED Provider will collect     Status: Abnormal   Collection Time: 02/21/15  3:10 AM  Result Value Ref Range   Fecal Occult Bld POSITIVE (A) NEGATIVE  CBC     Status: Abnormal   Collection Time: 02/21/15 10:54 AM  Result Value Ref Range   WBC 3.3 (L) 4.0 - 10.5 K/uL   RBC 2.88 (L) 3.87 - 5.11 MIL/uL   Hemoglobin 8.6 (L) 12.0 - 15.0 g/dL   HCT 26.6 (L) 36.0 - 46.0 %   MCV 92.4 78.0 - 100.0 fL   MCH 29.9 26.0 - 34.0 pg   MCHC 32.3 30.0 - 36.0 g/dL   RDW 14.9 11.5 - 15.5 %   Platelets 289 150 - 400 K/uL  Basic metabolic panel     Status: Abnormal   Collection Time: 02/21/15 10:54 AM  Result Value Ref Range   Sodium 141 135 - 145 mmol/L   Potassium 3.8 3.5 - 5.1 mmol/L   Chloride 108 101 - 111 mmol/L   CO2 25 22 - 32 mmol/L   Glucose, Bld 88 65 - 99 mg/dL   BUN 14 6 - 20 mg/dL   Creatinine, Ser 1.19 (H) 0.44 - 1.00 mg/dL   Calcium 9.1 8.9 - 10.3 mg/dL   GFR calc non Af Amer 39 (L) >60 mL/min   GFR calc Af Amer 46 (L) >60 mL/min    Comment: (NOTE) The eGFR has been calculated using the CKD EPI equation. This calculation has not been validated in all clinical situations. eGFR's persistently <60 mL/min signify possible Chronic Kidney Disease.    Anion gap 8 5 - 15  CBC     Status: Abnormal   Collection Time: 02/22/15  5:44 AM  Result Value Ref Range   WBC 4.2 4.0 - 10.5 K/uL   RBC 2.57 (L) 3.87 - 5.11 MIL/uL   Hemoglobin 7.7 (L) 12.0 - 15.0 g/dL   HCT 24.1 (L) 36.0 - 46.0 %   MCV 93.8 78.0 - 100.0 fL   MCH 30.0 26.0 - 34.0 pg   MCHC 32.0 30.0 - 36.0 g/dL    RDW 15.1 11.5 - 15.5 %   Platelets 266 150 - 400 K/uL  Comprehensive metabolic panel     Status: Abnormal   Collection Time: 02/22/15  5:44 AM  Result Value Ref Range   Sodium 142 135 - 145 mmol/L   Potassium 4.0 3.5 - 5.1 mmol/L   Chloride 108 101 - 111 mmol/L   CO2 27 22 - 32 mmol/L   Glucose, Bld 109 (H) 65 - 99 mg/dL   BUN 18 6 - 20 mg/dL   Creatinine, Ser 1.48 (H) 0.44 - 1.00 mg/dL   Calcium 8.3 (L) 8.9 - 10.3 mg/dL   Total Protein 4.8 (L) 6.5 - 8.1 g/dL   Albumin 2.7 (L) 3.5 - 5.0 g/dL   AST 19 15 - 41 U/L   ALT 12 (L) 14 - 54 U/L   Alkaline Phosphatase 49 38 - 126 U/L   Total Bilirubin 0.3 0.3 - 1.2 mg/dL   GFR calc non Af Amer 30 (L) >60 mL/min   GFR calc Af Amer 35 (L) >60 mL/min    Comment: (NOTE) The eGFR has been calculated using the CKD EPI equation. This calculation has not been validated in all clinical situations. eGFR's persistently <60 mL/min  signify possible Chronic Kidney Disease.    Anion gap 7 5 - 15  Prepare RBC     Status: None   Collection Time: 02/22/15 12:05 PM  Result Value Ref Range   Order Confirmation ORDER PROCESSED BY BLOOD BANK     No results found.             Blood pressure 109/76, pulse 84, temperature 98.5 F (36.9 C), temperature source Oral, resp. rate 18, height 5' 2.5" (1.588 m), weight 95.2 kg (209 lb 14.1 oz), SpO2 100 %.  Physical exam:   General-- pleasant African-American female no acute distress ENT-- nonicteric Neck-- the lymphadenopathy Heart-- regular rate and rhythm without murmurs or gallops Lungs-- clear Abdomen-- soft and nontender nondistended Psych-- alert and oriented answers questions appropriately appears younger than her stated age   Assessment: 1. Recurrent G.I. bleeding. This is probably diverticular but it's been sometime since she has been evaluated. I think given the degree of bleeding this should be repeated  Plan: 1. We will plan EGD in the morning time still to be determined. We  will likely schedule colonoscopy with the ultrathin colonoscope which is available here in the hospital due to the problems passing larger colonoscope's due to her severe diverticular disease. Have discussed this plan with the patient she is agreeable 2. Would transfuse as you are 3. Will go ahead and start on Miralax.   Tanaiya Kolarik JR,Yuki Brunsman L 02/22/2015, 4:36 PM   Pager: [phone] If no answer or after hours call [phone]

## 2015-02-23 NOTE — Anesthesia Procedure Notes (Signed)
Procedure Name: MAC Date/Time: 02/23/2015 11:42 AM Performed by: Coralee RudFLORES, Lizzie Cokley

## 2015-02-23 NOTE — Anesthesia Preprocedure Evaluation (Addendum)
Anesthesia Evaluation  Patient identified by MRN, date of birth, ID band Patient awake    Reviewed: Allergy & Precautions, NPO status , Patient's Chart, lab work & pertinent test results  Airway Mallampati: II  TM Distance: >3 FB Neck ROM: Limited    Dental  (+) Dental Advisory Given   Pulmonary neg pulmonary ROS,    breath sounds clear to auscultation       Cardiovascular hypertension, Pt. on medications and Pt. on home beta blockers  Rhythm:Regular Rate:Normal     Neuro/Psych negative neurological ROS     GI/Hepatic Neg liver ROS, GERD  ,  Endo/Other  Morbid obesity  Renal/GU Renal InsufficiencyRenal disease     Musculoskeletal   Abdominal   Peds  Hematology  (+) anemia ,   Anesthesia Other Findings   Reproductive/Obstetrics                           Lab Results  Component Value Date   WBC 4.1 02/23/2015   HGB 8.9* 02/23/2015   HCT 27.6* 02/23/2015   MCV 89.0 02/23/2015   PLT 282 02/23/2015   Lab Results  Component Value Date   CREATININE 1.19* 02/23/2015   BUN 14 02/23/2015   NA 139 02/23/2015   K 4.2 02/23/2015   CL 107 02/23/2015   CO2 23 02/23/2015    Anesthesia Physical Anesthesia Plan  ASA: III  Anesthesia Plan: MAC   Post-op Pain Management:    Induction: Intravenous  Airway Management Planned: Natural Airway and Simple Face Mask  Additional Equipment:   Intra-op Plan:   Post-operative Plan:   Informed Consent: I have reviewed the patients History and Physical, chart, labs and discussed the procedure including the risks, benefits and alternatives for the proposed anesthesia with the patient or authorized representative who has indicated his/her understanding and acceptance.     Plan Discussed with: CRNA  Anesthesia Plan Comments:         Anesthesia Quick Evaluation

## 2015-02-24 ENCOUNTER — Encounter (HOSPITAL_COMMUNITY)
Admission: EM | Disposition: A | Discharge status: Home / Self Care | Payer: Self-pay | Source: Home / Self Care | Attending: Emergency Medicine

## 2015-02-24 ENCOUNTER — Encounter (HOSPITAL_COMMUNITY): Payer: Self-pay

## 2015-02-24 DIAGNOSIS — Z8719 Personal history of other diseases of the digestive system: Secondary | ICD-10-CM | POA: Diagnosis not present

## 2015-02-24 DIAGNOSIS — I10 Essential (primary) hypertension: Secondary | ICD-10-CM | POA: Diagnosis not present

## 2015-02-24 DIAGNOSIS — K922 Gastrointestinal hemorrhage, unspecified: Secondary | ICD-10-CM | POA: Diagnosis not present

## 2015-02-24 HISTORY — PX: FLEXIBLE SIGMOIDOSCOPY: SHX5431

## 2015-02-24 LAB — CBC
HCT: 28.3 % — ABNORMAL LOW (ref 36.0–46.0)
Hemoglobin: 9 g/dL — ABNORMAL LOW (ref 12.0–15.0)
MCH: 28.2 pg (ref 26.0–34.0)
MCHC: 31.8 g/dL (ref 30.0–36.0)
MCV: 88.7 fL (ref 78.0–100.0)
Platelets: 297 10*3/uL (ref 150–400)
RBC: 3.19 MIL/uL — ABNORMAL LOW (ref 3.87–5.11)
RDW: 17.3 % — ABNORMAL HIGH (ref 11.5–15.5)
WBC: 5 10*3/uL (ref 4.0–10.5)

## 2015-02-24 SURGERY — SIGMOIDOSCOPY, FLEXIBLE
Anesthesia: Moderate Sedation

## 2015-02-24 MED ORDER — FENTANYL CITRATE (PF) 100 MCG/2ML IJ SOLN
INTRAMUSCULAR | Status: DC | PRN
  Administered 2015-02-24 (×2): 25 ug via INTRAVENOUS

## 2015-02-24 MED ORDER — SODIUM CHLORIDE 0.9 % IV SOLN: INTRAVENOUS | Status: DC

## 2015-02-24 MED ORDER — FENTANYL CITRATE (PF) 100 MCG/2ML IJ SOLN
INTRAMUSCULAR | Status: AC
  Filled 2015-02-24: qty 2

## 2015-02-24 MED ORDER — MIDAZOLAM HCL 5 MG/5ML IJ SOLN
INTRAMUSCULAR | Status: DC | PRN
  Administered 2015-02-24: 1 mg via INTRAVENOUS
  Administered 2015-02-24: 2 mg via INTRAVENOUS
  Administered 2015-02-24: 1 mg via INTRAVENOUS

## 2015-02-24 MED ORDER — MIDAZOLAM HCL 5 MG/ML IJ SOLN
INTRAMUSCULAR | Status: AC
  Filled 2015-02-24: qty 2

## 2015-02-24 NOTE — Discharge Summary (Signed)
Discharge Summary  ALMER LITTLETON ZOX:[id number] DOB: 10-Nov-1923  PCP: No primary care provider on file.  Admit date: 02/21/2015 Discharge date: 02/24/2015  Time spent: 25 minutes  Recommendations for Outpatient Follow-up:  1. Virtual colonoscopy scheduled for 1/17 at 11:15 AM. Patient will go to [facility] radiology days beforehand to pickup prep 2. Dr. Randa Evens will follow-up on results  Discharge Diagnoses:  Active Hospital Problems   Diagnosis Date Noted  . Lower GI bleed 02/23/2012  . Hypertension 11/13/2012  . History of GI diverticular bleed 02/23/2012    Resolved Hospital Problems   Diagnosis Date Noted Date Resolved  No resolved problems to display.    Discharge Condition: Improved, being discharged home   Diet recommendation: Low-sodium   Filed Weights   02/21/15 0935 02/22/15 2156 02/23/15 2100  Weight: 95.2 kg (209 lb 14.1 oz) 95.4 kg (210 lb 5.1 oz) 96.2 kg (212 lb 1.3 oz)    History of present illness:  82 year old female past medical history significant for hypertension and recurrent diverticular bleeding admitted on 1/9 for 7 days of black tarry stool. Patient's baseline hemoglobin around 11 and initially admitted with hemoglobin of 9 but by following day had come down to 7.7  Hospital Course:  Principal Problem:   Lower GI bleed: Patient transfused 1 unit packed red blood cells. Seen by GI and patient underwent EGD on 1/11 noting only some old gastric ulceration, but no significant bleeding noted.  She then went for attempted colonoscopy on 1/12, but due to severe diverticulosis, unable to pass even small stroke through. Decision was then made for patient to get virtual colonoscopy which will need to be done as outpatient. Given that hemoglobin remained stable at 9.0 for 24 hours, patient felt to be safe for discharge on 1/12 Active Problems:   History of GI diverticular bleed   Hypertension: Blood pressure stable, restarted on oral  medications   Consultants:  Gastroenterology  Procedures:  Status post EGD done 1/11: Several small old gastric ulcerations, hiatal hernia   attempted colonoscopy 1/12: Unable to panel even small scope through due to severe diverticulosis  Discharge Exam: BP 130/56 mmHg  Pulse 58  Temp(Src) 97.9 F (36.6 C) (Oral)  Resp 16  Ht 5' 2.5" (1.588 m)  Wt 96.2 kg (212 lb 1.3 oz)  BMI 38.15 kg/m2  SpO2 100%  General: Alert and oriented 3, no acute distress   Cardiovascular: Regular rate and rhythm, S1 and S2 Respiratory: Clear to auscultation bilaterally   Discharge Instructions You were cared for by a hospitalist during your hospital stay. If you have any questions about your discharge medications or the care you received while you were in the hospital after you are discharged, you can call the unit and asked to speak with the hospitalist on call if the hospitalist that took care of you is not available. Once you are discharged, your primary care physician will handle any further medical issues. Please note that NO REFILLS for any discharge medications will be authorized once you are discharged, as it is imperative that you return to your primary care physician (or establish a relationship with a primary care physician if you do not have one) for your aftercare needs so that they can reassess your need for medications and monitor your lab values.  Discharge Instructions    Diet - low sodium heart healthy    Complete by:  As directed      Increase activity slowly    Complete by:  As directed  Medication List    TAKE these medications        acetaminophen 325 MG tablet  Commonly known as:  TYLENOL  Take 650 mg by mouth every 6 (six) hours as needed. pain     aspirin EC 81 MG tablet  Take 81 mg by mouth daily.     cholecalciferol 1000 units tablet  Commonly known as:  VITAMIN D  Take 2,000 Units by mouth daily.     ciprofloxacin-dexamethasone otic suspension   Commonly known as:  CIPRODEX  Place 2 drops into both ears daily as needed. For ear dryness and discomfort     esomeprazole 40 MG capsule  Commonly known as:  NEXIUM  Take 40 mg by mouth daily before breakfast.     ferrous sulfate 325 (65 FE) MG tablet  Take 325 mg by mouth daily with breakfast.     HYDROcodone-acetaminophen 5-500 MG tablet  Commonly known as:  VICODIN  Take 1 tablet by mouth daily as needed. pain     losartan-hydrochlorothiazide 100-12.5 MG tablet  Commonly known as:  HYZAAR  Take 1 tablet by mouth daily.     metoprolol tartrate 25 MG tablet  Commonly known as:  LOPRESSOR  Take 25 mg by mouth 2 (two) times daily.     multivitamin with minerals Tabs tablet  Take 1 tablet by mouth daily.     nitroGLYCERIN 0.4 MG SL tablet  Commonly known as:  NITROSTAT  Place 0.4 mg under the tongue every 5 (five) minutes as needed.     PATADAY OP  Apply 1 drop to eye daily as needed. For allergy dryness     polyethylene glycol packet  Commonly known as:  MIRALAX / GLYCOLAX  Take 17 g by mouth daily as needed. constipation     potassium chloride 10 MEQ tablet  Commonly known as:  K-DUR  Take 10 mEq by mouth daily.       Allergies  Allergen Reactions  . Ezetimibe Anaphylaxis  . Cholestyramine Nausea And Vomiting  . Lipitor [Atorvastatin] Swelling  . Pravachol [Pravastatin] Swelling  . Statins Swelling    Swelling of mouth and lips  . Zocor [Simvastatin] Swelling       Follow-up Information    Follow up with Virtual Colonoscopy On 03/01/2015.   Why:  Appt at 11:15.  Come by the office anytime few days before for the medicine to prep the colon   Contact information:   [address] [place], [place] [phone]       The results of significant diagnostics from this hospitalization (including imaging, microbiology, ancillary and laboratory) are listed below for reference.    Significant Diagnostic Studies: No results  found.  Microbiology: No results found for this or any previous visit (from the past 240 hour(s)).   Labs: Basic Metabolic Panel:  Recent Labs Lab 02/21/15 0210 02/21/15 1054 02/22/15 0544 02/23/15 0503  NA 141 141 142 139  K 4.3 3.8 4.0 4.2  CL 104 108 108 107  CO2 26 25 27 23   GLUCOSE 128* 88 109* 88  BUN 17 14 18 14   CREATININE 1.40* 1.19* 1.48* 1.19*  CALCIUM 9.4 9.1 8.3* 8.6*   Liver Function Tests:  Recent Labs Lab 02/21/15 0210 02/22/15 0544  AST 20 19  ALT 11* 12*  ALKPHOS 61 49  BILITOT 0.3 0.3  PROT 6.1* 4.8*  ALBUMIN 3.2* 2.7*   No results for input(s): LIPASE, AMYLASE in the last 168 hours. No results for  input(s): AMMONIA in the last 168 hours. CBC:  Recent Labs Lab 02/21/15 0210 02/21/15 1054 02/22/15 0544 02/23/15 0503 02/24/15 0650  WBC 3.6* 3.3* 4.2 4.1 5.0  NEUTROABS 2.2  --   --   --   --   HGB 9.0* 8.6* 7.7* 8.9* 9.0*  HCT 27.8* 26.6* 24.1* 27.6* 28.3*  MCV 92.7 92.4 93.8 89.0 88.7  PLT 296 289 266 282 297   Cardiac Enzymes: No results for input(s): CKTOTAL, CKMB, CKMBINDEX, TROPONINI in the last 168 hours. BNP: BNP (last 3 results) No results for input(s): BNP in the last 8760 hours.  ProBNP (last 3 results) No results for input(s): PROBNP in the last 8760 hours.  CBG: No results for input(s): GLUCAP in the last 168 hours.     Signed:  Hollice Espy  [facility] 02/24/2015, 1:56 PM

## 2015-02-24 NOTE — Op Note (Signed)
[facility] [address] [place], [id number]   COLONOSCOPY PROCEDURE REPORT  PATIENT: Brenda GladeDavis, Brenda C  MR#: [id number] BIRTHDATE: 21-May-1923 , 89  yrs. old GENDER: female ENDOSCOPIST: Carman ChingJames Navin Dogan, MD REFERRED BY:  [facility] Dr Concepcion ElkAvbuere PROCEDURE DATE:  02/24/2015 PROCEDURE:   colonoscopy aborted ASA CLASS:   class III INDICATIONS:history of severe diverticulosis preventing colonoscopy with recurrent bleeding MEDICATIONS: fentanyl 50 g versed 4 mg IV  DESCRIPTION OF PROCEDURE:   After the risks and benefits and of the procedure were explained, informed consent was obtained.  digital exam was normal         The Pentax Ultra Slim [id number]  endoscope was introduced through the anus and advanced to the sigmoid colon and extensive diverticular disease prevented advancing the scope further. We place the patient in the supine position and despite all maneuvers we were unable to advance beyond the sigmoid: was fixed in a mobile with severe diverticular disease.      .  The quality of the prep was    .  The instrument was then slowly withdrawn as the colon was fully examined. Estimated blood loss is zero unless otherwise noted in this procedure report. there was no signs of active bleeding. No polyps or other gross lesion were seen in the sigmoid or rectum upon removal of the scope. Extensive diverticular disease.   normal retro flex view          The scope was then withdrawn from the patient and the procedure completed.  WITHDRAWAL TIME:  COMPLICATIONS: There were no immediate complications. ENDOSCOPIC IMPRESSION: 1. G.I. bleeding. Probably due to diverticular disease. Due to extensive diverticular disease in a mobile: even with the ultrathin scope we were unable to pass the sigmoid. RECOMMENDATIONS: will go ahead with virtual colonoscopy rule out a gross lesion of the right colon.  REPEAT EXAM:  cc:Dr  Avbuere  _______________________________ eSignedCarman Ching:  Ishmail Mcmanamon, MD 02/24/2015 8:37 AM   CPT CODES: ICD CODES:  The ICD and CPT codes recommended by this software are interpretations from the data that the clinical staff has captured with the software.  The verification of the translation of this report to the ICD and CPT codes and modifiers is the sole responsibility of the health care institution and practicing physician where this report was generated.  [employer] will not be held responsible for the validity of the ICD and CPT codes included on this report.  AMA assumes no liability for data contained or not contained herein. CPT is a Publishing rights managerregistered trademark of the [employer].

## 2015-02-24 NOTE — Progress Notes (Signed)
Per radiology virtual colonoscopy is not done in the hospital.  Out patient procedure.  MD notified.

## 2015-02-24 NOTE — Progress Notes (Signed)
Discharge instructions and medications discussed with patient.  Handout on Diverticulitis given to patient.  All questions answered.

## 2015-02-24 NOTE — Interval H&P Note (Signed)
History and Physical Interval Note:  02/24/2015 7:57 AM  [name]  has presented today for surgery, with the diagnosis of GI Bleed  The various methods of treatment have been discussed with the patient and family. After consideration of risks, benefits and other options for treatment, the patient has consented to  Procedure(s) with comments: COLONOSCOPY (N/A) - scheduled at 8 AM on 1/12 as a surgical intervention .  The patient's history has been reviewed, patient examined, no change in status, stable for surgery.  I have reviewed the patient's chart and labs.  Questions were answered to the patient's satisfaction.     Magaly Pollina JR,Lyssa Hackley L

## 2015-02-25 ENCOUNTER — Encounter (HOSPITAL_COMMUNITY): Payer: Self-pay | Admitting: Gastroenterology

## 2015-03-01 ENCOUNTER — Other Ambulatory Visit: Payer: Medicare Other

## 2015-03-07 ENCOUNTER — Other Ambulatory Visit: Payer: Self-pay | Admitting: Gastroenterology

## 2015-03-07 DIAGNOSIS — K922 Gastrointestinal hemorrhage, unspecified: Secondary | ICD-10-CM

## 2015-03-07 DIAGNOSIS — K5710 Diverticulosis of small intestine without perforation or abscess without bleeding: Secondary | ICD-10-CM

## 2015-03-08 ENCOUNTER — Other Ambulatory Visit: Payer: Medicare Other

## 2015-03-08 ENCOUNTER — Ambulatory Visit
Admission: RE | Admit: 2015-03-08 | Discharge: 2015-03-08 | Disposition: A | Discharge status: Home / Self Care | Payer: Medicare Other | Source: Ambulatory Visit | Attending: Gastroenterology | Admitting: Gastroenterology

## 2015-03-08 DIAGNOSIS — K5710 Diverticulosis of small intestine without perforation or abscess without bleeding: Secondary | ICD-10-CM

## 2015-03-08 DIAGNOSIS — K922 Gastrointestinal hemorrhage, unspecified: Secondary | ICD-10-CM

## 2015-03-16 DIAGNOSIS — Z9289 Personal history of other medical treatment: Secondary | ICD-10-CM

## 2015-03-16 DIAGNOSIS — K921 Melena: Secondary | ICD-10-CM

## 2015-03-16 HISTORY — DX: Personal history of other medical treatment: Z92.89

## 2015-03-16 HISTORY — DX: Melena: K92.1

## 2015-04-28 ENCOUNTER — Other Ambulatory Visit: Payer: Self-pay | Admitting: Physician Assistant

## 2015-05-05 ENCOUNTER — Encounter (HOSPITAL_COMMUNITY): Payer: Self-pay

## 2015-05-05 ENCOUNTER — Encounter (HOSPITAL_COMMUNITY)
Admission: RE | Admit: 2015-05-05 | Discharge: 2015-05-05 | Disposition: A | Discharge status: Home / Self Care | Payer: Medicare Other | Source: Ambulatory Visit | Attending: Orthopaedic Surgery | Admitting: Orthopaedic Surgery

## 2015-05-05 DIAGNOSIS — Z79899 Other long term (current) drug therapy: Secondary | ICD-10-CM | POA: Diagnosis not present

## 2015-05-05 DIAGNOSIS — Z01818 Encounter for other preprocedural examination: Secondary | ICD-10-CM | POA: Diagnosis present

## 2015-05-05 DIAGNOSIS — K219 Gastro-esophageal reflux disease without esophagitis: Secondary | ICD-10-CM | POA: Diagnosis not present

## 2015-05-05 DIAGNOSIS — Z01812 Encounter for preprocedural laboratory examination: Secondary | ICD-10-CM | POA: Diagnosis not present

## 2015-05-05 DIAGNOSIS — E7800 Pure hypercholesterolemia, unspecified: Secondary | ICD-10-CM | POA: Insufficient documentation

## 2015-05-05 DIAGNOSIS — M1612 Unilateral primary osteoarthritis, left hip: Secondary | ICD-10-CM | POA: Diagnosis not present

## 2015-05-05 DIAGNOSIS — Z87891 Personal history of nicotine dependence: Secondary | ICD-10-CM | POA: Diagnosis not present

## 2015-05-05 DIAGNOSIS — Z7982 Long term (current) use of aspirin: Secondary | ICD-10-CM | POA: Diagnosis not present

## 2015-05-05 DIAGNOSIS — I10 Essential (primary) hypertension: Secondary | ICD-10-CM | POA: Diagnosis not present

## 2015-05-05 HISTORY — DX: Unspecified osteoarthritis, unspecified site: M19.90

## 2015-05-05 HISTORY — DX: Nonrheumatic aortic (valve) stenosis: I35.0

## 2015-05-05 HISTORY — DX: Other difficulties with micturition: R39.198

## 2015-05-05 HISTORY — DX: Effusion, unspecified joint: M25.40

## 2015-05-05 HISTORY — DX: Nocturia: R35.1

## 2015-05-05 HISTORY — DX: Personal history of other medical treatment: Z92.89

## 2015-05-05 HISTORY — DX: Melena: K92.1

## 2015-05-05 HISTORY — DX: Anemia, unspecified: D64.9

## 2015-05-05 HISTORY — DX: Edema, unspecified: R60.9

## 2015-05-05 HISTORY — DX: Localized edema: R60.0

## 2015-05-05 HISTORY — DX: Pain in unspecified joint: M25.50

## 2015-05-05 HISTORY — DX: Anesthesia of skin: R20.0

## 2015-05-05 HISTORY — DX: Reserved for inherently not codable concepts without codable children: IMO0001

## 2015-05-05 LAB — CBC
HCT: 33.2 % — ABNORMAL LOW (ref 36.0–46.0)
Hemoglobin: 10.5 g/dL — ABNORMAL LOW (ref 12.0–15.0)
MCH: 29.1 pg (ref 26.0–34.0)
MCHC: 31.6 g/dL (ref 30.0–36.0)
MCV: 92 fL (ref 78.0–100.0)
Platelets: 301 10*3/uL (ref 150–400)
RBC: 3.61 MIL/uL — ABNORMAL LOW (ref 3.87–5.11)
RDW: 15.1 % (ref 11.5–15.5)
WBC: 5.6 10*3/uL (ref 4.0–10.5)

## 2015-05-05 LAB — BASIC METABOLIC PANEL
Anion gap: 9 (ref 5–15)
BUN: 27 mg/dL — ABNORMAL HIGH (ref 6–20)
CO2: 25 mmol/L (ref 22–32)
Calcium: 9.6 mg/dL (ref 8.9–10.3)
Chloride: 107 mmol/L (ref 101–111)
Creatinine, Ser: 1.56 mg/dL — ABNORMAL HIGH (ref 0.44–1.00)
GFR calc Af Amer: 33 mL/min — ABNORMAL LOW (ref >60)
GFR calc non Af Amer: 28 mL/min — ABNORMAL LOW (ref >60)
Glucose, Bld: 97 mg/dL (ref 65–99)
Potassium: 5.3 mmol/L — ABNORMAL HIGH (ref 3.5–5.1)
Sodium: 141 mmol/L (ref 135–145)

## 2015-05-05 LAB — SURGICAL PCR SCREEN
MRSA, PCR: NEGATIVE
Staphylococcus aureus: NEGATIVE

## 2015-05-05 MED ORDER — CHLORHEXIDINE GLUCONATE 4 % EX LIQD: 60.0000 mL | Freq: Once | CUTANEOUS | Status: DC

## 2015-05-05 NOTE — Pre-Procedure Instructions (Signed)
[name]  05/05/2015      [facility] - [place], [place] - [address] [facility] [address] [place] [id number] Phone: [phone] Fax: [phone]    Your procedure is scheduled on Tues, April 4 @ 3:30 PM  [facility] at 1:30 PM  Call this number if you have problems the morning of surgery:  [phone]   Remember:  Do not eat food or drink liquids after midnight.  Take these medicines the morning of surgery with A SIP OF WATER Nexium(Esomeprazole) and Metoprolol(Lopressor)             Stop taking your Aspirin along with any Vitamins or Herbal Medications a week prior to surgery. No Goody's,BC's,Aleve,Advil,Motrin,Ibuprofen,or Fish Oil.   Do not wear jewelry, make-up or nail polish.  Do not wear lotions, powders, or perfumes.  You may wear deodorant.  Do not shave 48 hours prior to surgery.    Do not bring valuables to the hospital.  [facility] is not responsible for any belongings or valuables.  Contacts, dentures or bridgework may not be worn into surgery.  Leave your suitcase in the car.  After surgery it may be brought to your room.  For patients admitted to the hospital, discharge time will be determined by your treatment team.  Patients discharged the day of surgery will not be allowed to drive home.    Special instructions:  [name] - Preparing for Surgery  Before surgery, you can play an important role.  Because skin is not sterile, your skin needs to be as free of germs as possible.  You can reduce the number of germs on you skin by washing with CHG (chlorahexidine gluconate) soap before surgery.  CHG is an antiseptic cleaner which kills germs and bonds with the skin to continue killing germs even after washing.  Please DO NOT use if you have an allergy to CHG or antibacterial soaps.  If your skin becomes reddened/irritated stop using the CHG and inform your nurse when you arrive at Short  Stay.  Do not shave (including legs and underarms) for at least 48 hours prior to the first CHG shower.  You may shave your face.  Please follow these instructions carefully:   1.  Shower with CHG Soap the night before surgery and the                                morning of Surgery.  2.  If you choose to wash your hair, wash your hair first as usual with your       normal shampoo.  3.  After you shampoo, rinse your hair and body thoroughly to remove the                      Shampoo.  4.  Use CHG as you would any other liquid soap.  You can apply chg directly       to the skin and wash gently with scrungie or a clean washcloth.  5.  Apply the CHG Soap to your body ONLY FROM THE NECK DOWN.        Do not use on open wounds or open sores.  Avoid contact with your eyes,       ears, mouth and genitals (private parts).  Wash genitals (private parts)       with your normal  soap.  6.  Wash thoroughly, paying special attention to the area where your surgery        will be performed.  7.  Thoroughly rinse your body with warm water from the neck down.  8.  DO NOT shower/wash with your normal soap after using and rinsing off       the CHG Soap.  9.  Pat yourself dry with a clean towel.            10.  Wear clean pajamas.            11.  Place clean sheets on your bed the night of your first shower and do not        sleep with pets.  Day of Surgery  Do not apply any lotions/deoderants the morning of surgery.  Please wear clean clothes to the hospital/surgery center.    Please read over the following fact sheets that you were given. Pain Booklet, Coughing and Deep Breathing, MRSA Information and Surgical Site Infection Prevention

## 2015-05-05 NOTE — Progress Notes (Addendum)
Cardiologist is Dr.Ganji=to request last OV as well as the echo that was done for clearance.Pt states another EKG was done and to request that  Medical Md is Dr.Edwin Avbuere  Echo report in epic from 2010  Stress test reports in epic from 2007/2009  EKG in epic from 02-21-15  CXR denies in past yr  Sleep study denies

## 2015-05-06 ENCOUNTER — Encounter (HOSPITAL_COMMUNITY): Payer: Self-pay

## 2015-05-06 NOTE — Progress Notes (Addendum)
Anesthesia Chart Review: Patient is a 82 year old female scheduled for left THA on 05/17/15 by Dr. Maureen Ralphs. [name].  History includes former smoker, HTN, mild AS (by 03/2015 echo), hypercholesterolemia, GERD, SOB, anemia, GI bleed admission 02/2015 (EGD mild chronic active gastric inflammation; unsuccessful colonoscopy; virtual colonoscopy showed large hiatal hernia, aortic atherosclerosis, stable bilateral renal cysts, and diffuse colonic diverticulosis), diverticulitis, peripheral edema, numbness (legs), cholecystectomy. PCP is Dr. Fleet ContrasEdwin [name].   Cardiologist is Dr. Jacinto [name] with [facility]. He last saw patient on 03/28/15 for follow-up echo and carotid U/s. He wrote that he saw patient the previous much for cardiac clearance and had cleared patient, "As patient is asymptomatic without clinical evidence of heart failure, no significant cardiac vascular risk, due to poor to poor quality of life issues without hip surgery.."  Meds include ASA, Nexium, losartan-HCTZ (Hyzaar), 65 Fe, Lopressor, Nitro, Kdur.  PAT Vitals: BP 168/65, HR 59, RR 20, T 36.7C, O2 sat 100%.  03/03/15 EKG (Dr. Jacinto [name]): SB at 57 bpm, first degree AVB, normal axis, low voltage, poor R wave progression, cannot exclude old anterior infarct.  03/16/15 Echo (Dr. Jacinto [name]): Conclusion: 1. LV cavity is normal in size. Moderate concentric LVH. Normal global wall motion. Diastolic dysfunction findings suggests elevated LV/LV end diastolic pressure. Calculated EF 66%. 2. LA cavity is mildly dilated. RA cavity is mildly dilated. 3. RV cavity is normal in size. Mild concentric hypertrophy of the RV. Normal RV function. 4. Mild calcification on the AV annulus. Mild AV leaflet calcification. No AR. Mild AS with peak gradient of 18 and mean gradient of 9 mmHg, AVA 1.32 cm.  5. Mild MR. 6. Mild to moderate TR. No evidence of pulmonary hypertension. 7. Compared to the study done on 09/04/12, no significant change, mild to moderate TR and RVH is  new.   03/16/15 Carotid U/S (Dr. Jacinto [name]): No hemodynamically significant arterial disease in the ICAs bilaterally. Antegrade bilateral vertebral artery flow.  03/21/07 Nuclear stress test: IMPRESSION:  No evidence for infarction or ischemia. Estimated ejection fraction is 65%.  Preoperative labs noted. K 5.3 (no mention of hemolysis), BUN 27, Cr 1.56 (previously 1.19-1.48 in 02/2015). H/H 10.5/33.2. I will forward labs to Dr. Concepcion ElkAvbuere for review and any additional preoperative recommendations since her K+ is elevated and Cr seems a little above her baseline when compared to 02/2015 results. If labs aren't checked before her surgery date, then I will order an ISTAT8 for the day of surgery to re-evaluate.   Brenda Ochsllison [name], PA-C [facility]/Anesthesiology Phone [phone] 05/06/2015 5:32 PM  Addendum: I spoke with Dr. Albertina ParrAvbuere's nurse yesterday to confirm receipt of labs and knowledge of surgery plans for 05/17/15. Patient had labs repeated today at his office which showed a K 5.0, Cr 1.47. Dr. Concepcion ElkAvbuere commented that results had improved. He also called me. He is stopping her KCl. Apparently, she was also taking Lasix PRN, and he felt that may have dehydrated her, so he asked her to hold that as well. He thinks her current labs are acceptable for surgery next week. I'll discontinue my ISTAT8 order for the day of surgery. Further evaluation of patient on the day of surgery by her surgeon and anesthesiologist to ensure no acute changes.  Brenda Ochsllison Sheehan Stacey, PA-C [facility]iology Phone [phone] 05/13/2015 5:22 PM

## 2015-05-16 MED ORDER — CEFAZOLIN SODIUM-DEXTROSE 2-4 GM/100ML-% IV SOLN
2.0000 g | INTRAVENOUS | Status: AC
  Administered 2015-05-17: 2 g via INTRAVENOUS
  Filled 2015-05-16: qty 100

## 2015-05-17 ENCOUNTER — Encounter (HOSPITAL_COMMUNITY)
Admission: RE | Disposition: A | Discharge status: Skilled Nursing Facility | Payer: Self-pay | Source: Ambulatory Visit | Attending: Orthopaedic Surgery

## 2015-05-17 ENCOUNTER — Inpatient Hospital Stay (HOSPITAL_COMMUNITY): Payer: Medicare Other

## 2015-05-17 ENCOUNTER — Inpatient Hospital Stay (HOSPITAL_COMMUNITY): Payer: Medicare Other | Admitting: Vascular Surgery

## 2015-05-17 ENCOUNTER — Inpatient Hospital Stay (HOSPITAL_COMMUNITY)
Admission: RE | Admit: 2015-05-17 | Discharge: 2015-05-20 | DRG: 470 | Disposition: A | Discharge status: Skilled Nursing Facility | Payer: Medicare Other | Source: Ambulatory Visit | Attending: Orthopaedic Surgery | Admitting: Orthopaedic Surgery

## 2015-05-17 ENCOUNTER — Inpatient Hospital Stay (HOSPITAL_COMMUNITY): Payer: Medicare Other | Admitting: Certified Registered Nurse Anesthetist

## 2015-05-17 ENCOUNTER — Encounter (HOSPITAL_COMMUNITY): Payer: Self-pay | Admitting: *Deleted

## 2015-05-17 DIAGNOSIS — Z8249 Family history of ischemic heart disease and other diseases of the circulatory system: Secondary | ICD-10-CM | POA: Diagnosis not present

## 2015-05-17 DIAGNOSIS — I350 Nonrheumatic aortic (valve) stenosis: Secondary | ICD-10-CM | POA: Diagnosis present

## 2015-05-17 DIAGNOSIS — Z87892 Personal history of anaphylaxis: Secondary | ICD-10-CM | POA: Diagnosis not present

## 2015-05-17 DIAGNOSIS — E7800 Pure hypercholesterolemia, unspecified: Secondary | ICD-10-CM | POA: Diagnosis present

## 2015-05-17 DIAGNOSIS — Z6836 Body mass index (BMI) 36.0-36.9, adult: Secondary | ICD-10-CM

## 2015-05-17 DIAGNOSIS — D62 Acute posthemorrhagic anemia: Secondary | ICD-10-CM | POA: Diagnosis not present

## 2015-05-17 DIAGNOSIS — M1612 Unilateral primary osteoarthritis, left hip: Secondary | ICD-10-CM

## 2015-05-17 DIAGNOSIS — Z87891 Personal history of nicotine dependence: Secondary | ICD-10-CM

## 2015-05-17 DIAGNOSIS — Z96642 Presence of left artificial hip joint: Secondary | ICD-10-CM

## 2015-05-17 DIAGNOSIS — Z419 Encounter for procedure for purposes other than remedying health state, unspecified: Secondary | ICD-10-CM

## 2015-05-17 DIAGNOSIS — D649 Anemia, unspecified: Secondary | ICD-10-CM | POA: Diagnosis present

## 2015-05-17 DIAGNOSIS — I10 Essential (primary) hypertension: Secondary | ICD-10-CM | POA: Diagnosis present

## 2015-05-17 DIAGNOSIS — K219 Gastro-esophageal reflux disease without esophagitis: Secondary | ICD-10-CM | POA: Diagnosis present

## 2015-05-17 DIAGNOSIS — Z888 Allergy status to other drugs, medicaments and biological substances status: Secondary | ICD-10-CM | POA: Diagnosis not present

## 2015-05-17 HISTORY — DX: Presence of left artificial hip joint: Z96.642

## 2015-05-17 HISTORY — DX: Unilateral primary osteoarthritis, left hip: M16.12

## 2015-05-17 HISTORY — PX: TOTAL HIP ARTHROPLASTY: SHX124

## 2015-05-17 SURGERY — ARTHROPLASTY, HIP, TOTAL, ANTERIOR APPROACH
Anesthesia: General | Laterality: Left

## 2015-05-17 MED ORDER — HYDROCHLOROTHIAZIDE 12.5 MG PO CAPS
12.5000 mg | ORAL_CAPSULE | Freq: Every day | ORAL | Status: DC
  Administered 2015-05-17 – 2015-05-20 (×4): 12.5 mg via ORAL
  Filled 2015-05-17 (×4): qty 1

## 2015-05-17 MED ORDER — EPHEDRINE SULFATE 50 MG/ML IJ SOLN
INTRAMUSCULAR | Status: DC | PRN
  Administered 2015-05-17 (×2): 5 mg via INTRAVENOUS

## 2015-05-17 MED ORDER — METOPROLOL TARTRATE 25 MG PO TABS
25.0000 mg | ORAL_TABLET | Freq: Two times a day (BID) | ORAL | Status: DC
  Administered 2015-05-17 – 2015-05-20 (×6): 25 mg via ORAL
  Filled 2015-05-17 (×6): qty 1

## 2015-05-17 MED ORDER — HYDROCODONE-ACETAMINOPHEN 5-325 MG PO TABS
1.0000 | ORAL_TABLET | ORAL | Status: DC | PRN
  Administered 2015-05-18 (×2): 2 via ORAL
  Administered 2015-05-18: 1 via ORAL
  Administered 2015-05-19: 2 via ORAL
  Administered 2015-05-19: 1 via ORAL
  Filled 2015-05-17: qty 1
  Filled 2015-05-17 (×2): qty 2
  Filled 2015-05-17: qty 1
  Filled 2015-05-17: qty 2

## 2015-05-17 MED ORDER — ONDANSETRON HCL 4 MG/2ML IJ SOLN: 4.0000 mg | Freq: Four times a day (QID) | INTRAMUSCULAR | Status: DC | PRN

## 2015-05-17 MED ORDER — FENTANYL CITRATE (PF) 100 MCG/2ML IJ SOLN
INTRAMUSCULAR | Status: AC
  Filled 2015-05-17: qty 2

## 2015-05-17 MED ORDER — CEFAZOLIN SODIUM 1-5 GM-% IV SOLN
1.0000 g | Freq: Four times a day (QID) | INTRAVENOUS | Status: AC
  Administered 2015-05-17 – 2015-05-18 (×2): 1 g via INTRAVENOUS
  Filled 2015-05-17 (×3): qty 50

## 2015-05-17 MED ORDER — HYDRALAZINE HCL 20 MG/ML IJ SOLN
INTRAMUSCULAR | Status: AC
  Filled 2015-05-17: qty 1

## 2015-05-17 MED ORDER — METHOCARBAMOL 500 MG PO TABS
500.0000 mg | ORAL_TABLET | Freq: Four times a day (QID) | ORAL | Status: DC | PRN
  Administered 2015-05-17: 500 mg via ORAL

## 2015-05-17 MED ORDER — ADULT MULTIVITAMIN W/MINERALS CH
1.0000 | ORAL_TABLET | Freq: Every day | ORAL | Status: DC
  Administered 2015-05-18 – 2015-05-20 (×3): 1 via ORAL
  Filled 2015-05-17 (×3): qty 1

## 2015-05-17 MED ORDER — NITROGLYCERIN 0.4 MG SL SUBL
0.4000 mg | SUBLINGUAL_TABLET | SUBLINGUAL | Status: DC | PRN
  Administered 2015-05-19: 0.4 mg via SUBLINGUAL
  Filled 2015-05-17: qty 1

## 2015-05-17 MED ORDER — ACETAMINOPHEN 650 MG RE SUPP: 650.0000 mg | Freq: Four times a day (QID) | RECTAL | Status: DC | PRN

## 2015-05-17 MED ORDER — METHOCARBAMOL 1000 MG/10ML IJ SOLN
500.0000 mg | Freq: Four times a day (QID) | INTRAVENOUS | Status: DC | PRN
  Filled 2015-05-17: qty 5

## 2015-05-17 MED ORDER — DIPHENHYDRAMINE HCL 12.5 MG/5ML PO ELIX
12.5000 mg | ORAL_SOLUTION | ORAL | Status: DC | PRN
  Filled 2015-05-17: qty 10

## 2015-05-17 MED ORDER — ONDANSETRON HCL 4 MG/2ML IJ SOLN
INTRAMUSCULAR | Status: DC | PRN
  Administered 2015-05-17: 4 mg via INTRAVENOUS

## 2015-05-17 MED ORDER — ASPIRIN EC 325 MG PO TBEC
325.0000 mg | DELAYED_RELEASE_TABLET | Freq: Two times a day (BID) | ORAL | Status: DC
  Administered 2015-05-18 – 2015-05-20 (×5): 325 mg via ORAL
  Filled 2015-05-17 (×5): qty 1

## 2015-05-17 MED ORDER — 0.9 % SODIUM CHLORIDE (POUR BTL) OPTIME
TOPICAL | Status: DC | PRN
  Administered 2015-05-17: 1000 mL

## 2015-05-17 MED ORDER — GLYCOPYRROLATE 0.2 MG/ML IJ SOLN
INTRAMUSCULAR | Status: DC | PRN
  Administered 2015-05-17: 0.4 mg via INTRAVENOUS

## 2015-05-17 MED ORDER — SODIUM CHLORIDE 0.9 % IV SOLN
INTRAVENOUS | Status: DC
  Administered 2015-05-17 – 2015-05-19 (×3): via INTRAVENOUS

## 2015-05-17 MED ORDER — POTASSIUM CHLORIDE CRYS ER 10 MEQ PO TBCR
10.0000 meq | EXTENDED_RELEASE_TABLET | Freq: Every day | ORAL | Status: DC
  Administered 2015-05-18 – 2015-05-20 (×3): 10 meq via ORAL
  Filled 2015-05-17 (×4): qty 1

## 2015-05-17 MED ORDER — HYDROMORPHONE HCL 1 MG/ML IJ SOLN
0.5000 mg | INTRAMUSCULAR | Status: DC | PRN
  Administered 2015-05-17 – 2015-05-18 (×3): 0.5 mg via INTRAVENOUS
  Filled 2015-05-17 (×3): qty 1

## 2015-05-17 MED ORDER — LIDOCAINE HCL (CARDIAC) 20 MG/ML IV SOLN
INTRAVENOUS | Status: DC | PRN
  Administered 2015-05-17: 50 mg via INTRAVENOUS

## 2015-05-17 MED ORDER — ALUM & MAG HYDROXIDE-SIMETH 200-200-20 MG/5ML PO SUSP: 30.0000 mL | ORAL | Status: DC | PRN

## 2015-05-17 MED ORDER — PANTOPRAZOLE SODIUM 40 MG PO TBEC
80.0000 mg | DELAYED_RELEASE_TABLET | Freq: Every day | ORAL | Status: DC
  Filled 2015-05-17: qty 2

## 2015-05-17 MED ORDER — LACTATED RINGERS IV SOLN
INTRAVENOUS | Status: DC
  Administered 2015-05-17 (×2): via INTRAVENOUS

## 2015-05-17 MED ORDER — METOCLOPRAMIDE HCL 10 MG PO TABS: 5.0000 mg | ORAL_TABLET | Freq: Three times a day (TID) | ORAL | Status: DC | PRN

## 2015-05-17 MED ORDER — LOSARTAN POTASSIUM-HCTZ 100-12.5 MG PO TABS
1.0000 | ORAL_TABLET | Freq: Every day | ORAL | Status: DC
Start: 2015-05-17 — End: 2015-05-17

## 2015-05-17 MED ORDER — HYDRALAZINE HCL 20 MG/ML IJ SOLN
INTRAMUSCULAR | Status: DC | PRN
  Administered 2015-05-17: 2 mg via INTRAVENOUS

## 2015-05-17 MED ORDER — PROPOFOL 10 MG/ML IV BOLUS
INTRAVENOUS | Status: DC | PRN
  Administered 2015-05-17: 100 mg via INTRAVENOUS

## 2015-05-17 MED ORDER — FENTANYL CITRATE (PF) 100 MCG/2ML IJ SOLN
25.0000 ug | INTRAMUSCULAR | Status: DC | PRN
  Administered 2015-05-17 (×3): 25 ug via INTRAVENOUS
  Administered 2015-05-17: 50 ug via INTRAVENOUS

## 2015-05-17 MED ORDER — PHENYLEPHRINE 40 MCG/ML (10ML) SYRINGE FOR IV PUSH (FOR BLOOD PRESSURE SUPPORT)
PREFILLED_SYRINGE | INTRAVENOUS | Status: AC
  Filled 2015-05-17: qty 20

## 2015-05-17 MED ORDER — ROCURONIUM BROMIDE 100 MG/10ML IV SOLN
INTRAVENOUS | Status: DC | PRN
  Administered 2015-05-17: 30 mg via INTRAVENOUS

## 2015-05-17 MED ORDER — ONDANSETRON HCL 4 MG PO TABS: 4.0000 mg | ORAL_TABLET | Freq: Four times a day (QID) | ORAL | Status: DC | PRN

## 2015-05-17 MED ORDER — METOCLOPRAMIDE HCL 5 MG/ML IJ SOLN: 5.0000 mg | Freq: Three times a day (TID) | INTRAMUSCULAR | Status: DC | PRN

## 2015-05-17 MED ORDER — NEOSTIGMINE METHYLSULFATE 10 MG/10ML IV SOLN
INTRAVENOUS | Status: DC | PRN
  Administered 2015-05-17: 3 mg via INTRAVENOUS

## 2015-05-17 MED ORDER — OXYCODONE HCL 5 MG/5ML PO SOLN: 5.0000 mg | Freq: Once | ORAL | Status: AC | PRN

## 2015-05-17 MED ORDER — PHENYLEPHRINE HCL 10 MG/ML IJ SOLN
INTRAMUSCULAR | Status: DC | PRN
  Administered 2015-05-17: 120 ug via INTRAVENOUS

## 2015-05-17 MED ORDER — PHENOL 1.4 % MT LIQD: 1.0000 | OROMUCOSAL | Status: DC | PRN

## 2015-05-17 MED ORDER — METHOCARBAMOL 500 MG PO TABS
ORAL_TABLET | ORAL | Status: AC
  Filled 2015-05-17: qty 1

## 2015-05-17 MED ORDER — VITAMIN D 1000 UNITS PO TABS
2000.0000 [IU] | ORAL_TABLET | Freq: Every day | ORAL | Status: DC
  Administered 2015-05-18 – 2015-05-20 (×3): 2000 [IU] via ORAL
  Filled 2015-05-17 (×4): qty 2

## 2015-05-17 MED ORDER — LOSARTAN POTASSIUM 50 MG PO TABS
100.0000 mg | ORAL_TABLET | Freq: Every day | ORAL | Status: DC
  Administered 2015-05-17 – 2015-05-20 (×4): 100 mg via ORAL
  Filled 2015-05-17 (×4): qty 2

## 2015-05-17 MED ORDER — OXYCODONE HCL 5 MG PO TABS
ORAL_TABLET | ORAL | Status: AC
  Filled 2015-05-17: qty 1

## 2015-05-17 MED ORDER — FERROUS SULFATE 325 (65 FE) MG PO TABS
325.0000 mg | ORAL_TABLET | Freq: Every day | ORAL | Status: DC
  Administered 2015-05-18 – 2015-05-20 (×2): 325 mg via ORAL
  Filled 2015-05-17 (×3): qty 1

## 2015-05-17 MED ORDER — FENTANYL CITRATE (PF) 100 MCG/2ML IJ SOLN
INTRAMUSCULAR | Status: DC | PRN
  Administered 2015-05-17: 25 ug via INTRAVENOUS
  Administered 2015-05-17: 50 ug via INTRAVENOUS
  Administered 2015-05-17: 100 ug via INTRAVENOUS
  Administered 2015-05-17: 50 ug via INTRAVENOUS
  Administered 2015-05-17: 25 ug via INTRAVENOUS

## 2015-05-17 MED ORDER — ACETAMINOPHEN 325 MG PO TABS: 650.0000 mg | ORAL_TABLET | Freq: Four times a day (QID) | ORAL | Status: DC | PRN

## 2015-05-17 MED ORDER — DOCUSATE SODIUM 100 MG PO CAPS
100.0000 mg | ORAL_CAPSULE | Freq: Two times a day (BID) | ORAL | Status: DC
  Administered 2015-05-17 – 2015-05-20 (×6): 100 mg via ORAL
  Filled 2015-05-17 (×6): qty 1

## 2015-05-17 MED ORDER — FENTANYL CITRATE (PF) 250 MCG/5ML IJ SOLN
INTRAMUSCULAR | Status: AC
  Filled 2015-05-17: qty 5

## 2015-05-17 MED ORDER — MENTHOL 3 MG MT LOZG: 1.0000 | LOZENGE | OROMUCOSAL | Status: DC | PRN

## 2015-05-17 MED ORDER — OXYCODONE HCL 5 MG PO TABS
5.0000 mg | ORAL_TABLET | Freq: Once | ORAL | Status: AC | PRN
  Administered 2015-05-17: 5 mg via ORAL

## 2015-05-17 SURGICAL SUPPLY — 49 items
APL SKNCLS STERI-STRIP NONHPOA (GAUZE/BANDAGES/DRESSINGS) ×1
BENZOIN TINCTURE PRP APPL 2/3 (GAUZE/BANDAGES/DRESSINGS) ×2 IMPLANT
BLADE SAW SGTL 18X1.27X75 (BLADE) ×2 IMPLANT
BLADE SURG ROTATE 9660 (MISCELLANEOUS) IMPLANT
CAPT HIP TOTAL 2 ×1 IMPLANT
CELLS DAT CNTRL 66122 CELL SVR (MISCELLANEOUS) ×1 IMPLANT
COVER SURGICAL LIGHT HANDLE (MISCELLANEOUS) ×2 IMPLANT
DRAPE C-ARM 42X72 X-RAY (DRAPES) ×2 IMPLANT
DRAPE STERI IOBAN 125X83 (DRAPES) ×2 IMPLANT
DRAPE U-SHAPE 47X51 STRL (DRAPES) ×6 IMPLANT
DRSG AQUACEL AG ADV 3.5X10 (GAUZE/BANDAGES/DRESSINGS) ×2 IMPLANT
DURAPREP 26ML APPLICATOR (WOUND CARE) ×2 IMPLANT
ELECT BLADE 4.0 EZ CLEAN MEGAD (MISCELLANEOUS) ×2
ELECT BLADE 6.5 EXT (BLADE) IMPLANT
ELECT REM PT RETURN 9FT ADLT (ELECTROSURGICAL) ×2
ELECTRODE BLDE 4.0 EZ CLN MEGD (MISCELLANEOUS) ×1 IMPLANT
ELECTRODE REM PT RTRN 9FT ADLT (ELECTROSURGICAL) ×1 IMPLANT
FACESHIELD WRAPAROUND (MASK) ×6 IMPLANT
FACESHIELD WRAPAROUND OR TEAM (MASK) ×2 IMPLANT
GAUZE XEROFORM 5X9 LF (GAUZE/BANDAGES/DRESSINGS) ×1 IMPLANT
GLOVE BIOGEL PI IND STRL 8 (GLOVE) ×2 IMPLANT
GLOVE BIOGEL PI INDICATOR 8 (GLOVE) ×2
GLOVE ECLIPSE 8.0 STRL XLNG CF (GLOVE) ×2 IMPLANT
GLOVE ORTHO TXT STRL SZ7.5 (GLOVE) ×4 IMPLANT
GOWN STRL REUS W/ TWL LRG LVL3 (GOWN DISPOSABLE) IMPLANT
GOWN STRL REUS W/TWL LRG LVL3 (GOWN DISPOSABLE) ×8
HANDPIECE INTERPULSE COAX TIP (DISPOSABLE) ×2
KIT BASIN OR (CUSTOM PROCEDURE TRAY) ×2 IMPLANT
KIT ROOM TURNOVER OR (KITS) ×2 IMPLANT
MANIFOLD NEPTUNE II (INSTRUMENTS) ×2 IMPLANT
NS IRRIG 1000ML POUR BTL (IV SOLUTION) ×2 IMPLANT
PACK TOTAL JOINT (CUSTOM PROCEDURE TRAY) ×2 IMPLANT
PAD ARMBOARD 7.5X6 YLW CONV (MISCELLANEOUS) ×2 IMPLANT
RETRACTOR WND ALEXIS 18 MED (MISCELLANEOUS) ×1 IMPLANT
RTRCTR WOUND ALEXIS 18CM MED (MISCELLANEOUS) ×2
SET HNDPC FAN SPRY TIP SCT (DISPOSABLE) ×1 IMPLANT
STAPLER VISISTAT 35W (STAPLE) IMPLANT
STRIP CLOSURE SKIN 1/2X4 (GAUZE/BANDAGES/DRESSINGS) ×4 IMPLANT
SUT ETHIBOND NAB CT1 #1 30IN (SUTURE) ×2 IMPLANT
SUT MNCRL AB 4-0 PS2 18 (SUTURE) IMPLANT
SUT VIC AB 0 CT1 27 (SUTURE) ×2
SUT VIC AB 0 CT1 27XBRD ANBCTR (SUTURE) ×1 IMPLANT
SUT VIC AB 1 CT1 27 (SUTURE) ×2
SUT VIC AB 1 CT1 27XBRD ANBCTR (SUTURE) ×1 IMPLANT
SUT VIC AB 2-0 CT1 27 (SUTURE) ×2
SUT VIC AB 2-0 CT1 TAPERPNT 27 (SUTURE) ×1 IMPLANT
TOWEL OR 17X24 6PK STRL BLUE (TOWEL DISPOSABLE) ×2 IMPLANT
TOWEL OR 17X26 10 PK STRL BLUE (TOWEL DISPOSABLE) ×2 IMPLANT
TRAY FOLEY CATH 16FRSI W/METER (SET/KITS/TRAYS/PACK) ×1 IMPLANT

## 2015-05-17 NOTE — Brief Op Note (Signed)
05/17/2015  5:06 PM  PATIENT:  [name]  82 y.o. female  PRE-OPERATIVE DIAGNOSIS:  Left hip osteoarthritis  POST-OPERATIVE DIAGNOSIS:  Left hip osteoarthritis  PROCEDURE:  Procedure(s): LEFT TOTAL HIP ARTHROPLASTY ANTERIOR APPROACH (Left)  SURGEON:  Surgeon(s) and Role:    * Kathryne Hitchhristopher Y Onia Shiflett, MD - Primary    * Kathryne Hitchhristopher Y Summit Borchardt, MD  PHYSICIAN ASSISTANT: Rexene EdisonGil clark, PA-C  ANESTHESIA:   general  EBL:  Total I/O In: -  Out: 250 [Blood:250]  COUNTS:  YES  DICTATION: .Other Dictation: Dictation Number [id number]  PLAN OF CARE: Admit to inpatient   PATIENT DISPOSITION:  PACU - hemodynamically stable.   Delay start of Pharmacological VTE agent (>24hrs) due to surgical blood loss or risk of bleeding: no

## 2015-05-17 NOTE — Anesthesia Preprocedure Evaluation (Signed)
Anesthesia Evaluation  Patient identified by MRN, date of birth, ID band Patient awake    Reviewed: Allergy & Precautions, NPO status , Patient's Chart, lab work & pertinent test results  Airway Mallampati: II   Neck ROM: full    Dental   Pulmonary shortness of breath, former smoker,    breath sounds clear to auscultation       Cardiovascular hypertension, + Valvular Problems/Murmurs AS  Rhythm:regular Rate:Normal     Neuro/Psych    GI/Hepatic GERD  ,  Endo/Other  obese  Renal/GU      Musculoskeletal  (+) Arthritis ,   Abdominal   Peds  Hematology   Anesthesia Other Findings   Reproductive/Obstetrics                             Anesthesia Physical Anesthesia Plan  ASA: III  Anesthesia Plan: General   Post-op Pain Management:    Induction: Intravenous  Airway Management Planned: Oral ETT  Additional Equipment:   Intra-op Plan:   Post-operative Plan: Extubation in OR  Informed Consent: I have reviewed the patients History and Physical, chart, labs and discussed the procedure including the risks, benefits and alternatives for the proposed anesthesia with the patient or authorized representative who has indicated his/her understanding and acceptance.     Plan Discussed with: CRNA, Anesthesiologist and Surgeon  Anesthesia Plan Comments:         Anesthesia Quick Evaluation

## 2015-05-17 NOTE — Transfer of Care (Signed)
Immediate Anesthesia Transfer of Care Note  Patient: [name]  Procedure(s) Performed: Procedure(s): LEFT TOTAL HIP ARTHROPLASTY ANTERIOR APPROACH (Left)  Patient Location: PACU  Anesthesia Type:General  Level of Consciousness: awake, alert , oriented and patient cooperative  Airway & Oxygen Therapy: Patient Spontanous Breathing and Patient connected to face mask oxygen  Post-op Assessment: Report given to RN, Post -op Vital signs reviewed and stable and Patient moving all extremities X 4  Post vital signs: Reviewed and stable  Last Vitals:  Filed Vitals:   05/17/15 1340  BP: 161/61  Pulse: 62  Temp: 36.9 C  Resp: 20    Complications: No apparent anesthesia complications

## 2015-05-17 NOTE — Anesthesia Procedure Notes (Signed)
Procedure Name: Intubation Date/Time: 05/17/2015 3:39 PM Performed by: Adonis HousekeeperNGELL, Keawe Marcello M Pre-anesthesia Checklist: Patient identified, Emergency Drugs available, Suction available and Patient being monitored Patient Re-evaluated:Patient Re-evaluated prior to inductionOxygen Delivery Method: Circle system utilized Preoxygenation: Pre-oxygenation with 100% oxygen Intubation Type: IV induction Ventilation: Mask ventilation without difficulty Laryngoscope Size: Miller and 2 Grade View: Grade I Tube type: Oral Tube size: 7.0 mm Number of attempts: 1 Airway Equipment and Method: Stylet Placement Confirmation: ETT inserted through vocal cords under direct vision,  positive ETCO2 and breath sounds checked- equal and bilateral Secured at: 21 cm Tube secured with: Tape Dental Injury: Teeth and Oropharynx as per pre-operative assessment

## 2015-05-17 NOTE — H&P (Signed)
TOTAL HIP ADMISSION H&P  Patient is admitted for left total hip arthroplasty.  Subjective:  Chief Complaint: left hip pain  HPI: [name], 82 y.o. female, has a history of pain and functional disability in the left hip(s) due to arthritis and patient has failed non-surgical conservative treatments for greater than 12 weeks to include NSAID's and/or analgesics, corticosteriod injections, supervised PT with diminished ADL's post treatment, use of assistive devices and activity modification.  Onset of symptoms was gradual starting 5 years ago with gradually worsening course since that time.The patient noted no past surgery on the left hip(s).  Patient currently rates pain in the left hip at 10 out of 10 with activity. Patient has night pain, worsening of pain with activity and weight bearing, trendelenberg gait, pain that interfers with activities of daily living, pain with passive range of motion and crepitus. Patient has evidence of subchondral cysts, subchondral sclerosis, periarticular osteophytes and joint space narrowing by imaging studies. This condition presents safety issues increasing the risk of falls.  There is no current active infection.  Patient Active Problem List   Diagnosis Date Noted  . Osteoarthritis of left hip 05/17/2015  . GI bleed 02/21/2015  . Acute bronchitis 11/16/2012  . Hypertension 11/13/2012  . Bright red blood per rectum 11/13/2012  . Lower GI bleed 02/23/2012  . History of GI diverticular bleed 02/23/2012  . Diverticulosis 02/23/2012   Past Medical History  Diagnosis Date  . Diverticulitis   . History of GI diverticular bleed   . High cholesterol     can't take the meds  . HTN (hypertension)     takes Losartan-HCTZ and Metoprolol daily  . Anemia     takes Ferrous Sulfate daily  . Acid reflux     takes Nexium daily  . Shortness of breath dyspnea     occasionally and with exertion  . Numbness     in legs  . Arthritis   . Joint pain   . Joint  swelling   . Peripheral edema     occasionally  . Slow urinary stream     at times  . Nocturia   . Hematochezia 03/2015  . History of blood transfusion 03/2015    no abnormal reaction noted  . Aortic stenosis     mild AS pk grad 18, mean grad 9, AVA 1.32 cm 03/2015 echo (Dr. Jacinto HalimGanji)    Past Surgical History  Procedure Laterality Date  . Abcess drainage      abdominal abcess  . Cholecystectomy    . Dilation and curettage of uterus    . Esophagogastroduodenoscopy N/A 02/23/2015    Procedure: ESOPHAGOGASTRODUODENOSCOPY (EGD);  Surgeon: Carman ChingJames Edwards, MD;  [facility];  Service: Endoscopy;  Laterality: N/A;  . Flexible sigmoidoscopy N/A 02/24/2015    Procedure: FLEXIBLE SIGMOIDOSCOPY;  Surgeon: Carman ChingJames Edwards, MD;  [facility];  Service: Endoscopy;  Laterality: N/A;  . Cataract surgery Bilateral   . Left knee surgery      No prescriptions prior to admission   Allergies  Allergen Reactions  . Ezetimibe Anaphylaxis  . Lipitor [Atorvastatin] Swelling  . Pravachol [Pravastatin] Swelling  . Statins Swelling and Other (See Comments)    Swelling of mouth and lips  . Zocor [Simvastatin] Swelling  . Cholestyramine Nausea And Vomiting    Social History  Substance Use Topics  . Smoking status: Former Games developermoker  . Smokeless tobacco: Never Used     Comment: quit smoking 8+ yrs ago  . Alcohol  Use: No     Comment: quit yrs ago    Family History  Problem Relation Age of Onset  . Diabetes Other   . Hypertension Other   . Cancer Other   . Heart disease Other      Review of Systems  Musculoskeletal: Positive for joint pain.  All other systems reviewed and are negative.   Objective:  Physical Exam  Constitutional: She is oriented to person, place, and time. She appears well-developed and well-nourished.  HENT:  Head: Normocephalic and atraumatic.  Eyes: EOM are normal. Pupils are equal, round, and reactive to light.  Neck: Normal range of motion. Neck supple.   Cardiovascular: Normal rate.   Respiratory: Effort normal and breath sounds normal.  GI: Soft. Bowel sounds are normal.  Musculoskeletal:       Left hip: She exhibits decreased range of motion, decreased strength, tenderness, bony tenderness and crepitus.  Neurological: She is alert and oriented to person, place, and time.  Skin: Skin is warm and dry.  Psychiatric: She has a normal mood and affect.    Vital signs in last 24 hours:    Labs:   Estimated body mass index is 38.15 kg/(m^2) as calculated from the following:   Height as of 02/21/15: 5' 2.5" (1.588 m).   Weight as of 02/21/15: 96.2 kg (212 lb 1.3 oz).   Imaging Review Plain radiographs demonstrate severe degenerative joint disease of the left hip(s). The bone quality appears to be good for age and reported activity level.  Assessment/Plan:  End stage arthritis, left hip(s)  The patient history, physical examination, clinical judgement of the provider and imaging studies are consistent with end stage degenerative joint disease of the left hip(s) and total hip arthroplasty is deemed medically necessary. The treatment options including medical management, injection therapy, arthroscopy and arthroplasty were discussed at length. The risks and benefits of total hip arthroplasty were presented and reviewed. The risks due to aseptic loosening, infection, stiffness, dislocation/subluxation,  thromboembolic complications and other imponderables were discussed.  The patient acknowledged the explanation, agreed to proceed with the plan and consent was signed. Patient is being admitted for inpatient treatment for surgery, pain control, PT, OT, prophylactic antibiotics, VTE prophylaxis, progressive ambulation and ADL's and discharge planning.The patient is planning to be discharged home with home health services

## 2015-05-18 ENCOUNTER — Encounter (HOSPITAL_COMMUNITY): Payer: Self-pay | Admitting: Orthopaedic Surgery

## 2015-05-18 LAB — BASIC METABOLIC PANEL
Anion gap: 12 (ref 5–15)
BUN: 17 mg/dL (ref 6–20)
CO2: 26 mmol/L (ref 22–32)
Calcium: 8.8 mg/dL — ABNORMAL LOW (ref 8.9–10.3)
Chloride: 101 mmol/L (ref 101–111)
Creatinine, Ser: 1.07 mg/dL — ABNORMAL HIGH (ref 0.44–1.00)
GFR calc Af Amer: 52 mL/min — ABNORMAL LOW (ref >60)
GFR calc non Af Amer: 45 mL/min — ABNORMAL LOW (ref >60)
Glucose, Bld: 124 mg/dL — ABNORMAL HIGH (ref 65–99)
Potassium: 4.1 mmol/L (ref 3.5–5.1)
Sodium: 139 mmol/L (ref 135–145)

## 2015-05-18 LAB — CBC
HCT: 27.9 % — ABNORMAL LOW (ref 36.0–46.0)
Hemoglobin: 9.3 g/dL — ABNORMAL LOW (ref 12.0–15.0)
MCH: 30.5 pg (ref 26.0–34.0)
MCHC: 33.3 g/dL (ref 30.0–36.0)
MCV: 91.5 fL (ref 78.0–100.0)
Platelets: 201 10*3/uL (ref 150–400)
RBC: 3.05 MIL/uL — ABNORMAL LOW (ref 3.87–5.11)
RDW: 14.5 % (ref 11.5–15.5)
WBC: 6.1 10*3/uL (ref 4.0–10.5)

## 2015-05-18 NOTE — Evaluation (Signed)
Physical Therapy Evaluation Patient Details Name: [name] MRN: [id number] DOB: 01/29/1924 Today's Date: 05/18/2015   History of Present Illness  82 yo female s/p LTHR direct anterior approach. PMHx:LTKR, HTN, SOB on exertion  Clinical Impression  Pt is s/p THA resulting in the deficits listed below (see PT Problem List). Pt ambulated 7445' with RW and min A. Moving well but lives alone and experiences DOE with ambulation, recommend SNF before home alone. Pt will benefit from skilled PT to increase their independence and safety with mobility to allow discharge to the venue listed below.      Follow Up Recommendations SNF    Equipment Recommendations  None recommended by PT    Recommendations for Other Services       Precautions / Restrictions Precautions Precautions: Fall Restrictions Weight Bearing Restrictions: No LLE Weight Bearing: Weight bearing as tolerated      Mobility  Bed Mobility Overal bed mobility: Needs Assistance Bed Mobility: Supine to Sit     Supine to sit: HOB elevated;Supervision     General bed mobility comments: coming out on right hand side of bed, able to pivot without physical assist  Transfers Overall transfer level: Needs assistance Equipment used: Rolling walker (2 wheeled) Transfers: Sit to/from Stand Sit to Stand: Min guard         General transfer comment: VCs for safe hand placement  Ambulation/Gait Ambulation/Gait assistance: Min assist Ambulation Distance (Feet): 45 Feet Assistive device: Rolling walker (2 wheeled) Gait Pattern/deviations: Step-to pattern;Decreased weight shift to left;Decreased stance time - left;Decreased step length - left Gait velocity: decr   General Gait Details: vc's for sequencing and posture.   Stairs            Wheelchair Mobility    Modified Rankin (Stroke Patients Only)       Balance Overall balance assessment: Needs assistance Sitting-balance support: No upper extremity  supported Sitting balance-Leahy Scale: Good     Standing balance support: Bilateral upper extremity supported Standing balance-Leahy Scale: Poor                               Pertinent Vitals/Pain Pain Assessment: Faces Faces Pain Scale: Hurts little more Pain Location: left hip Pain Descriptors / Indicators: Grimacing Pain Intervention(s): Limited activity within patient's tolerance;Monitored during session;Repositioned    Home Living Family/patient expects to be discharged to:: Skilled nursing facility Living Arrangements: Alone Available Help at Discharge: Family Type of Home: House Home Access: Stairs to enter Entrance Stairs-Rails: Can reach both;Right;Left Entrance Stairs-Number of Steps: 2 Home Layout: One level Home Equipment: Walker - 2 wheels;Shower seat Additional Comments: pt does not think that she has family available to come stay with her when she gets home.     Prior Function Level of Independence: Independent with assistive device(s)         Comments: reports that she has been using a cane or walker since [facility]     Hand Dominance   Dominant Hand: Right    Extremity/Trunk Assessment   Upper Extremity Assessment: Defer to OT evaluation           Lower Extremity Assessment: LLE deficits/detail   LLE Deficits / Details: knee flex / ext 3/5, hip flex 3-/5  Cervical / Trunk Assessment: Kyphotic  Communication   Communication: No difficulties  Cognition Arousal/Alertness: Awake/alert Behavior During Therapy: WFL for tasks assessed/performed Overall Cognitive Status: Within Functional Limits  for tasks assessed                      General Comments General comments (skin integrity, edema, etc.): pt with 2/4 DOE, O2 sats 96% on RA    Exercises Total Joint Exercises Ankle Circles/Pumps: AROM;Both;10 reps;Seated Quad Sets: AROM;Both;10 reps;Seated Gluteal Sets: AROM;Both;10 reps;Seated       Assessment/Plan    PT Assessment Patient needs continued PT services  PT Diagnosis Difficulty walking;Abnormality of gait;Acute pain   PT Problem List Decreased strength;Decreased activity tolerance;Decreased balance;Decreased mobility;Decreased knowledge of use of DME;Decreased knowledge of precautions;Pain;Cardiopulmonary status limiting activity  PT Treatment Interventions DME instruction;Gait training;Functional mobility training;Therapeutic exercise;Therapeutic activities;Balance training;Patient/family education   PT Goals (Current goals can be found in the Care Plan section) Acute Rehab PT Goals Patient Stated Goal: to maybe go to rehab for a week and then home PT Goal Formulation: With patient Time For Goal Achievement: 05/25/15 Potential to Achieve Goals: Good    Frequency 7X/week   Barriers to discharge Decreased caregiver support lives alone with empty house on one side and apts on other    Co-evaluation PT/OT/SLP Co-Evaluation/Treatment: Yes Reason for Co-Treatment: For patient/therapist safety PT goals addressed during session: Mobility/safety with mobility;Balance;Proper use of DME OT goals addressed during session: ADL's and self-care;Strengthening/ROM       End of Session Equipment Utilized During Treatment: Gait belt Activity Tolerance: Patient tolerated treatment well Patient left: in chair;with call bell/phone within reach;with chair alarm set Nurse Communication: Mobility status         Time: 1610-9604 PT Time Calculation (min) (ACUTE ONLY): 20 min   Charges:   PT Evaluation $PT Eval Moderate Complexity: 1 Procedure     PT G Codes:      Lyanne Co, PT  Acute Rehab Services  [phone]   Lyanne Co 05/18/2015, 11:39 AM

## 2015-05-18 NOTE — Anesthesia Postprocedure Evaluation (Signed)
Anesthesia Post Note  Patient: [name]  Procedure(s) Performed: Procedure(s) (LRB): LEFT TOTAL HIP ARTHROPLASTY ANTERIOR APPROACH (Left)  Patient location during evaluation: PACU Anesthesia Type: General Level of consciousness: awake and alert and patient cooperative Pain management: pain level controlled Vital Signs Assessment: post-procedure vital signs reviewed and stable Respiratory status: spontaneous breathing and respiratory function stable Cardiovascular status: stable Anesthetic complications: no    Last Vitals:  Filed Vitals:   05/18/15 0115 05/18/15 0403  BP: 140/73 132/62  Pulse: 57 71  Temp: 36.5 C 36.8 C  Resp: 16 18    Last Pain:  Filed Vitals:   05/18/15 0748  PainSc: 5                  Aura Bibby S

## 2015-05-18 NOTE — Evaluation (Signed)
Occupational Therapy Evaluation  Patient Details Name: [name] MRN: [id number] DOB: 1923/10/21 Today's Date: 05/18/2015    History of Present Illness 82 yo female s/p LTHR direct anterior approach. PMHx:LTKR, HTN, SOB on exertion   Clinical Impression   This 82 yo female admitted with above presents to acute OT with deficits below affecting her ability to care for herself at an Independent to Mod I level as she was pta. She will benefit from acute OT with follow up OT at SNF to get back to her PLOF.    Follow Up Recommendations  SNF    Equipment Recommendations  3 in 1 bedside comode       Precautions / Restrictions Precautions Precautions: Fall Restrictions Weight Bearing Restrictions: No LLE Weight Bearing: Weight bearing as tolerated      Mobility Bed Mobility Overal bed mobility: Needs Assistance Bed Mobility: Supine to Sit     Supine to sit: HOB elevated;Supervision     General bed mobility comments: coming out on right hand side of bed  Transfers Overall transfer level: Needs assistance   Transfers: Sit to/from Stand Sit to Stand: Min guard         General transfer comment: VCs for safe hand placement         ADL Overall ADL's : Needs assistance/impaired Eating/Feeding: Independent;Sitting   Grooming: Set up;Sitting   Upper Body Bathing: Set up;Sitting   Lower Body Bathing: Min guard;Sit to/from stand   Upper Body Dressing : Set up;Sitting   Lower Body Dressing: Min guard;Sit to/from stand   Toilet Transfer: Minimal assistance;Ambulation;RW (bed>hallway>sit in recliner)   Toileting- Clothing Manipulation and Hygiene: Min guard;Sit to/from stand                         Pertinent Vitals/Pain Pain Assessment: Faces Faces Pain Scale: Hurts little more Pain Location: operative site Pain Descriptors / Indicators: Aching;Sore Pain Intervention(s): Monitored during session;Repositioned     Hand Dominance Right    Extremity/Trunk Assessment Upper Extremity Assessment Upper Extremity Assessment: Overall WFL for tasks assessed   Lower Extremity Assessment Lower Extremity Assessment: Defer to PT evaluation       Communication Communication Communication: No difficulties   Cognition Arousal/Alertness: Awake/alert Behavior During Therapy: WFL for tasks assessed/performed Overall Cognitive Status: Within Functional Limits for tasks assessed                                Home Living Family/patient expects to be discharged to:: Skilled nursing facility Living Arrangements: Alone Available Help at Discharge: Family Type of Home: House Home Access: Stairs to enter [employer] of Steps: 4 Entrance Stairs-Rails: Can reach both Home Layout: One level     Bathroom Shower/Tub: Tub/shower unit;Curtain Shower/tub characteristics: Engineer, building services: Handicapped height     Home Equipment: Environmental consultant - 2 wheels;Shower seat          Prior Functioning/Environment Level of Independence: Independent with assistive device(s)             OT Diagnosis: Generalized weakness;Acute pain   OT Problem List: Decreased strength;Decreased range of motion;Impaired balance (sitting and/or standing);Pain;Decreased knowledge of use of DME or AE   OT Treatment/Interventions: Self-care/ADL training;Patient/family education;Balance training;Therapeutic activities;DME and/or AE instruction    OT Goals(Current goals can be found in the care plan section) Acute Rehab OT Goals Patient Stated Goal: to maybe go to rehab for a week and  then home OT Goal Formulation: With patient Time For Goal Achievement: 05/25/15 Potential to Achieve Goals: Good  OT Frequency: Min 2X/week   Barriers to D/C: Decreased caregiver support          Co-evaluation PT/OT/SLP Co-Evaluation/Treatment: Yes Reason for Co-Treatment: For patient/therapist safety   OT goals addressed during session: ADL's and  self-care;Strengthening/ROM      End of Session Equipment Utilized During Treatment: Gait belt;Rolling walker  Activity Tolerance: Patient tolerated treatment well Patient left: in chair;with call bell/phone within reach;with chair alarm set   Time: [id number] OT Time Calculation (min): 18 min Charges:  OT General Charges $OT Visit: 1 Procedure OT Evaluation $OT Eval Moderate Complexity: 1 Procedure  Evette GeorgesLeonard, Rudolfo Brandow [name] [phone] 05/18/2015, 10:14 AM

## 2015-05-18 NOTE — Progress Notes (Signed)
Physical Therapy Treatment Patient Details Name: [name] MRN: [id number] DOB: 05/25/1923 Today's Date: 05/18/2015    History of Present Illness 82 yo female s/p LTHR direct anterior approach. PMHx:LTKR, HTN, SOB on exertion    PT Comments    Pt progressing with mobility, ambulated 90' with RW and min A and performed bed exercises.  PT will continue to follow.   Follow Up Recommendations  SNF     Equipment Recommendations  None recommended by PT    Recommendations for Other Services       Precautions / Restrictions Precautions Precautions: Fall Restrictions Weight Bearing Restrictions: No LLE Weight Bearing: Weight bearing as tolerated    Mobility  Bed Mobility Overal bed mobility: Needs Assistance Bed Mobility: Sit to Supine     Supine to sit: HOB elevated;Supervision Sit to supine: Min assist   General bed mobility comments: min A to LLE for return to supine  Transfers Overall transfer level: Needs assistance Equipment used: Rolling walker (2 wheeled) Transfers: Sit to/from Stand Sit to Stand: Min guard         General transfer comment: pt rocks to gain momentum to stand, min A to steady and shift wt fwd  Ambulation/Gait Ambulation/Gait assistance: Min guard Ambulation Distance (Feet): 90 Feet Assistive device: Rolling walker (2 wheeled) Gait Pattern/deviations: Step-through pattern;Decreased weight shift to left;Decreased step length - left Gait velocity: decr   General Gait Details: vc's for erect posture. Pt reports stabbing pain left thigh when wt placed through LLE   Stairs            Wheelchair Mobility    Modified Rankin (Stroke Patients Only)       Balance Overall balance assessment: Needs assistance Sitting-balance support: No upper extremity supported Sitting balance-Leahy Scale: Good     Standing balance support: Bilateral upper extremity supported Standing balance-Leahy Scale: Poor                       Cognition Arousal/Alertness: Lethargic;Suspect due to medications Behavior During Therapy: [facility]FL for tasks assessed/performed Overall Cognitive Status: Within Functional Limits for tasks assessed                      Exercises Total Joint Exercises Ankle Circles/Pumps: AROM;Both;10 reps;Seated Quad Sets: AROM;Both;10 reps;Seated Gluteal Sets: AROM;Both;10 reps;Seated Heel Slides: AAROM;Left;10 reps;Supine Hip ABduction/ADduction: AAROM;Left;10 reps;Supine Straight Leg Raises: AAROM;Left;10 reps;Supine    General Comments General comments (skin integrity, edema, etc.): VSS      Pertinent Vitals/Pain Pain Assessment: Faces Faces Pain Scale: Hurts even more Pain Location: left thigh with WB'ing Pain Descriptors / Indicators: Stabbing Pain Intervention(s): Limited activity within patient's tolerance;Monitored during session;Premedicated before session    Home Living Family/patient expects to be discharged to:: Skilled nursing facility Living Arrangements: Alone Available Help at Discharge: Family Type of Home: House Home Access: Stairs to enter Entrance Stairs-Rails: Can reach both;Right;Left Home Layout: One level Home Equipment: Environmental consultantWalker - 2 wheels;Shower seat Additional Comments: pt does not think that she has family available to come stay with her when she gets home.     Prior Function Level of Independence: Independent with assistive device(s)      Comments: reports that she has been using a cane or walker since [facility]   PT Goals (current goals can now be found in the care plan section) Acute Rehab PT Goals Patient Stated Goal: to maybe go to rehab for a week and then  home PT Goal Formulation: With patient Time For Goal Achievement: 05/25/15 Potential to Achieve Goals: Good Progress towards PT goals: Progressing toward goals    Frequency  7X/week    PT Plan Current plan remains appropriate    Co-evaluation PT/OT/SLP  Co-Evaluation/Treatment: Yes Reason for Co-Treatment: For patient/therapist safety PT goals addressed during session: Mobility/safety with mobility;Balance;Proper use of DME       End of Session Equipment Utilized During Treatment: Gait belt Activity Tolerance: Patient tolerated treatment well Patient left: with call bell/phone within reach;in bed;with bed alarm set     Time: 4098-1191 PT Time Calculation (min) (ACUTE ONLY): 16 min  Charges:  $Gait Training: 8-22 mins                    G Codes:     Lyanne Co, PT  Acute Rehab Services  [phone]  Lyanne Co 05/18/2015, 2:31 PM

## 2015-05-18 NOTE — Op Note (Signed)
NAMENICHA, HEMANN NO.:  [id number]  MEDICAL RECORD NO.:  [id number]  LOCATION:  5W38C                        FACILITY:  [facility]  PHYSICIAN:  Vanita Panda. Magnus Ivan, M.D.DATE OF BIRTH:  1924/01/14  DATE OF PROCEDURE:  05/17/2015 DATE OF DISCHARGE:                              OPERATIVE REPORT   PREOPERATIVE DIAGNOSIS:  Severe primary osteoarthritis and degenerative joint disease, left hip.  POSTOPERATIVE DIAGNOSIS:  Severe primary osteoarthritis and degenerative joint disease, left hip.  PROCEDURE:  Left total hip arthroplasty through direct anterior approach.  IMPLANTS:  DePuy Sector Gription acetabular component size 54 with a single screw, size 36+ 0 polyethylene liner, size 12 Corail femoral component with standard offset, size 36+ 1.5 ceramic hip ball.  SURGEON:  Vanita Panda. Magnus Ivan, M.D.  ASSISTANT:  Richardean Canal, PA-C.  ANESTHESIA:  General.  ANTIBIOTICS:  2 g of IV Ancef.  BLOOD LOSS:  Less than 300 mL.  COMPLICATIONS:  None.  INDICATIONS:  Ms. Onalee Hua is a very active 82 year old with severe debilitating arthritis involving her left hip.  She has x-rays that shows almost a protrusio of the femoral head within the acetabulum.  Her pain is daily, it is detrimentally affected her activities of daily living and her quality of life.  She ambulates with an assistive device and her pain has gotten so severe at this point, and her quality of life was demand, so she wished to proceed with a total hip arthroplasty.  I did receive a clearance from her primary care physician for this surgery and she does wish to proceed.  She understands the risk of acute blood loss anemia, nerve and vessel injury, fracture, infection, dislocation, DVT.  She understands her goals are decreased pain, improved mobility, and overall improved quality of life.  PROCEDURE DESCRIPTION:  After informed consent was obtained, appropriate left hip was marked.  She was  brought to the operating room and general anesthesia was obtained while she was on her stretcher.  A Foley catheter was placed and then traction boots were placed on both of her feet.  Next, she was placed supine on the Hana fracture table with the perineal post in place and both legs in inline skeletal traction devices, but no traction applied.  Her left operative hip was then prepped and draped with DuraPrep and sterile drapes.  A time-out was called and she was identified as correct patient and correct left hip. We then made an incision inferior and posterior to the anterior superior iliac spine and carried this obliquely down the leg.  We dissected down the tensor fascia lata muscle and tensor fascia was then divided longitudinally, so we could proceed with direct anterior approach to the hip.  We identified and cauterized the circumflex vessels and then opened up the hip capsule in an L-type format finding significant periarticular osteophytes and a moderate joint effusion.  We placed the cobra retractors within joint capsule and then made our femoral neck cut with an oscillating saw proximal to the lesser trochanter and completed this with an osteotome.  We placed a corkscrew guide in the femoral head and removed the femoral head in its entirety and then cleaned the acetabulum  and remnants of the acetabular labrum and other debris.  We placed a bent Hohmann over the medial acetabular rim and then began reaming just touching it lightly with size 42 reamer up to a size 54 with all reamers under direct visualization and the last two reamers under direct fluoroscopy, so we could obtain our depth of reaming, our inclination, and anteversion.  We placed the real DePuy Sector Gription acetabular component, size 54 under direct fluoroscopy and visualization without difficulty and did place a single screw for extra support.  We placed a 36+ 0 polyethylene liner for that size acetabular  component. Attention was then turned to the femur.  With the leg externally rotated to 100 degrees, extended and abducted, we were able to place a Mueller retractor medially and Hohmann retractor behind the greater trochanter. We released the lateral joint capsule and used a box cutting osteotome to enter the femoral canal and a rongeur to lateralize.  We then began broaching using the Corail broaching system from a size 8 broach up to a size 12.  With the size 12 in place, we trialed a standard offset femoral neck and a 36+ 1.5 trial hip ball.  We brought the leg back over and up with traction and rotation reducing the pelvis.  We were pleased with leg length, offset, and stability and range of motion.  We then dislocated the hip and removed the trial components.  We were able to place the real Corail femoral component, size 12 with standard offset followed by the real 36+ 1.5 ceramic hip ball.  We reduced this in the pelvis and again it was stable.  We were able to copiously irrigated the soft tissue with normal saline solution using pulsatile lavage.  We closed the joint capsule tightly with interrupted #1 Ethibond suture followed by running #1 Vicryl in the tensor fascia, 0 Vicryl in the deep tissue, 2-0 Vicryl in the subcutaneous tissue, and interrupted staples on the skin.  Xeroform and Aquacel dressing were applied.  She was taken off the Hana table, awakened, extubated, and taken to the recovery room in stable condition.  All final counts were correct.  There were no complications noted.  Of note, Richardean CanalGilbert Clark, PA-C assisted in the entire case.  His assistance was crucial for facilitating all aspects of this case.     Vanita Pandahristopher Y. Magnus IvanBlackman, M.D.     CYB/MEDQ  D:  05/17/2015  T:  05/18/2015  Job:  [id number]

## 2015-05-18 NOTE — Progress Notes (Signed)
Subjective: 1 Day Post-Op Procedure(s) (LRB): LEFT TOTAL HIP ARTHROPLASTY ANTERIOR APPROACH (Left) Patient reports pain as moderate.    Objective: Vital signs in last 24 hours: Temp:  [97.4 F (36.3 C)-98.5 F (36.9 C)] 98.2 F (36.8 C) (04/05 0403) Pulse Rate:  [46-71] 71 (04/05 0403) Resp:  [13-20] 18 (04/05 0403) BP: (128-163)/(58-90) 132/62 mmHg (04/05 0403) SpO2:  [94 %-100 %] 100 % (04/05 0403) Weight:  [93.441 kg (206 lb)] 93.441 kg (206 lb) (04/04 1353)  Intake/Output from previous day: 04/04 0701 - 04/05 0700 In: 2308.8 [P.O.:510; I.V.:1798.8] Out: 950 [Urine:700; Blood:250] Intake/Output this shift:    No results for input(s): HGB in the last 72 hours. No results for input(s): WBC, RBC, HCT, PLT in the last 72 hours. No results for input(s): NA, K, CL, CO2, BUN, CREATININE, GLUCOSE, CALCIUM in the last 72 hours. No results for input(s): LABPT, INR in the last 72 hours.  Sensation intact distally Intact pulses distally Dorsiflexion/Plantar flexion intact Incision: dressing C/D/I  Assessment/Plan: 1 Day Post-Op Procedure(s) (LRB): LEFT TOTAL HIP ARTHROPLASTY ANTERIOR APPROACH (Left) Up with therapy  [name] 05/18/2015, 7:15 AM

## 2015-05-19 LAB — CBC
HCT: 21.5 % — ABNORMAL LOW (ref 36.0–46.0)
HCT: 28.8 % — ABNORMAL LOW (ref 36.0–46.0)
Hemoglobin: 7 g/dL — ABNORMAL LOW (ref 12.0–15.0)
Hemoglobin: 9.4 g/dL — ABNORMAL LOW (ref 12.0–15.0)
MCH: 29.3 pg (ref 26.0–34.0)
MCH: 28.8 pg (ref 26.0–34.0)
MCHC: 32.6 g/dL (ref 30.0–36.0)
MCHC: 32.6 g/dL (ref 30.0–36.0)
MCV: 90 fL (ref 78.0–100.0)
MCV: 88.3 fL (ref 78.0–100.0)
Platelets: 198 10*3/uL (ref 150–400)
Platelets: 207 10*3/uL (ref 150–400)
RBC: 2.39 MIL/uL — ABNORMAL LOW (ref 3.87–5.11)
RBC: 3.26 MIL/uL — ABNORMAL LOW (ref 3.87–5.11)
RDW: 14.5 % (ref 11.5–15.5)
RDW: 14.8 % (ref 11.5–15.5)
WBC: 5.1 10*3/uL (ref 4.0–10.5)
WBC: 6.4 10*3/uL (ref 4.0–10.5)

## 2015-05-19 LAB — PREPARE RBC (CROSSMATCH)

## 2015-05-19 MED ORDER — FUROSEMIDE 10 MG/ML IJ SOLN
20.0000 mg | Freq: Once | INTRAMUSCULAR | Status: AC
  Administered 2015-05-19: 20 mg via INTRAVENOUS
  Filled 2015-05-19: qty 2

## 2015-05-19 MED ORDER — DIPHENHYDRAMINE HCL 25 MG PO CAPS
25.0000 mg | ORAL_CAPSULE | Freq: Once | ORAL | Status: AC
  Administered 2015-05-19: 25 mg via ORAL
  Filled 2015-05-19: qty 1

## 2015-05-19 MED ORDER — ACETAMINOPHEN 325 MG PO TABS
650.0000 mg | ORAL_TABLET | Freq: Once | ORAL | Status: AC
  Administered 2015-05-19: 650 mg via ORAL
  Filled 2015-05-19: qty 2

## 2015-05-19 MED ORDER — SODIUM CHLORIDE 0.9 % IV SOLN
Freq: Once | INTRAVENOUS | Status: AC
  Administered 2015-05-19: 13:00:00 via INTRAVENOUS

## 2015-05-19 NOTE — NC FL2 (Addendum)
[place] MEDICAID FL2 LEVEL OF CARE SCREENING TOOL     IDENTIFICATION  Patient Name: [name] Birthdate: [birth year] Sex: female Admission Date (Current Location): 05/17/2015  [facility] and [place] Number:  Producer, television/film/video and Address:  [facility], [address], [place], [place] [address]      Provider Number: [id number]  Attending Physician Name and Address:  Kathryne Hitch, *  Relative Name and Phone Number:   Helen Hashimoto ( [phone]))    Current Level of Care: Hospital Recommended Level of Care: Skilled Nursing Facility Prior Approval Number:    Date Approved/Denied:   PASRR Number:   [id number]   Discharge Plan: SNF    Current Diagnoses: Patient Active Problem List   Diagnosis Date Noted  . Osteoarthritis of left hip 05/17/2015  . 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 GI bleed 02/21/2015  . Acute bronchitis 11/16/2012  . Hypertension 11/13/2012  . Bright red blood per rectum 11/13/2012  . Lower GI bleed 02/23/2012  . History of GI diverticular bleed 02/23/2012  . Diverticulosis 02/23/2012    Orientation RESPIRATION BLADDER Height & Weight     Self, Time, Situation, Place  Normal Continent Weight: 206 lb (93.441 kg) Height:  5' 3.5" (161.3 cm)  BEHAVIORAL SYMPTOMS/MOOD NEUROLOGICAL BOWEL NUTRITION STATUS      Continent Diet (regular)  AMBULATORY STATUS COMMUNICATION OF NEEDS Skin   Limited Assist Verbally Normal                       Personal Care Assistance Level of Assistance  Bathing, Feeding, Dressing Bathing Assistance: Limited assistance Feeding assistance: Limited assistance Dressing Assistance: Limited assistance     Functional Limitations Info  Sight, Hearing, Speech Sight Info: Adequate Hearing Info: Adequate Speech Info: Adequate    SPECIAL CARE FACTORS FREQUENCY  PT (By licensed PT), OT (By licensed OT)     PT Frequency: 7X/week OT Frequency: Min 2X/week              Contractures      Additional Factors Info  Code Status, Allergies Code Status Info: prior Allergies Info: Ezetimibe, Lipitor, Pravachol, Statins, Zocor, Cholestyramine           Current Medications (05/19/2015):  This is the current hospital active medication list Current Facility-Administered Medications  Medication Dose Route Frequency Provider Last Rate Last Dose  . 0.9 %  sodium chloride infusion   Intravenous Continuous Kathryne Hitch, MD 75 mL/hr at 05/19/15 0409    . 0.9 %  sodium chloride infusion   Intravenous Once Kirtland Bouchard, PA-C      . acetaminophen (TYLENOL) tablet 650 mg  650 mg Oral Q6H PRN Kathryne Hitch, MD       Or  . acetaminophen (TYLENOL) suppository 650 mg  650 mg Rectal Q6H PRN Kathryne Hitch, MD      . acetaminophen (TYLENOL) tablet 650 mg  650 mg Oral Once Kirtland Bouchard, PA-C      . alum & mag hydroxide-simeth (MAALOX/MYLANTA) 200-200-20 MG/5ML suspension 30 mL  30 mL Oral Q4H PRN Kathryne Hitch, MD      . aspirin EC tablet 325 mg  325 mg Oral BID PC Kathryne Hitch, MD   325 mg at 05/18/15 1706  . cholecalciferol (VITAMIN D) tablet 2,000 Units  2,000 Units Oral Daily Kathryne Hitch, MD   2,000 Units at 05/18/15 [phone]  . diphenhydrAMINE (  BENADRYL) 12.5 MG/5ML elixir 12.5-25 mg  12.5-25 mg Oral Q4H PRN Kathryne Hitchhristopher Y Blackman, MD      . diphenhydrAMINE (BENADRYL) capsule 25 mg  25 mg Oral Once Kirtland BouchardGilbert W Clark, PA-C      . docusate sodium (COLACE) capsule 100 mg  100 mg Oral BID Kathryne Hitchhristopher Y Blackman, MD   100 mg at 05/18/15 2120  . ferrous sulfate tablet 325 mg  325 mg Oral Q breakfast Kathryne Hitchhristopher Y Blackman, MD   325 mg at 05/18/15 0858  . furosemide (LASIX) injection 20 mg  20 mg Intravenous Once Kirtland BouchardGilbert W Clark, PA-C      . losartan (COZAAR) tablet 100 mg  100 mg Oral Daily Kathryne Hitchhristopher Y Blackman, MD   100 mg at 05/18/15 0859   And  . hydrochlorothiazide (MICROZIDE) capsule 12.5 mg  12.5 mg  Oral Daily Kathryne Hitchhristopher Y Blackman, MD   12.5 mg at 05/18/15 0859  . HYDROcodone-acetaminophen (NORCO/VICODIN) 5-325 MG per tablet 1-2 tablet  1-2 tablet Oral Q4H PRN Kathryne Hitchhristopher Y Blackman, MD   2 tablet at 05/18/15 2120  . HYDROmorphone (DILAUDID) injection 0.5 mg  0.5 mg Intravenous Q2H PRN Kathryne Hitchhristopher Y Blackman, MD   0.5 mg at 05/18/15 0854  . menthol-cetylpyridinium (CEPACOL) lozenge 3 mg  1 lozenge Oral PRN Kathryne Hitchhristopher Y Blackman, MD       Or  . phenol (CHLORASEPTIC) mouth spray 1 spray  1 spray Mouth/Throat PRN Kathryne Hitchhristopher Y Blackman, MD      . methocarbamol (ROBAXIN) tablet 500 mg  500 mg Oral Q6H PRN Kathryne Hitchhristopher Y Blackman, MD   500 mg at 05/17/15 1737   Or  . methocarbamol (ROBAXIN) 500 mg in dextrose 5 % 50 mL IVPB  500 mg Intravenous Q6H PRN Kathryne Hitchhristopher Y Blackman, MD      . metoCLOPramide (REGLAN) tablet 5-10 mg  5-10 mg Oral Q8H PRN Kathryne Hitchhristopher Y Blackman, MD       Or  . metoCLOPramide (REGLAN) injection 5-10 mg  5-10 mg Intravenous Q8H PRN Kathryne Hitchhristopher Y Blackman, MD      . metoprolol tartrate (LOPRESSOR) tablet 25 mg  25 mg Oral BID Kathryne Hitchhristopher Y Blackman, MD   25 mg at 05/18/15 2120  . multivitamin with minerals tablet 1 tablet  1 tablet Oral Daily Kathryne Hitchhristopher Y Blackman, MD   1 tablet at 05/18/15 [phone]  . nitroGLYCERIN (NITROSTAT) SL tablet 0.4 mg  0.4 mg Sublingual Q5 min PRN Kathryne Hitchhristopher Y Blackman, MD      . ondansetron [facility](ZOFRAN) tablet 4 mg  4 mg Oral Q6H PRN Kathryne Hitchhristopher Y Blackman, MD       Or  . ondansetron [facility](ZOFRAN) injection 4 mg  4 mg Intravenous Q6H PRN Kathryne Hitchhristopher Y Blackman, MD      . pantoprazole (PROTONIX) EC tablet 80 mg  80 mg Oral Q1200 Kathryne Hitchhristopher Y Blackman, MD   80 mg at 05/18/15 1218  . potassium chloride (K-DUR,KLOR-CON) CR tablet 10 mEq  10 mEq Oral Daily Kathryne Hitchhristopher Y Blackman, MD   10 mEq at 05/18/15 [phone]     Discharge Medications: Please see discharge summary for a list of discharge medications.  Relevant Imaging Results:  Relevant Lab  Results:   Additional Information SS# [id number]  Catheryn BaconCharlean McNeil  BSW intern  [phone]

## 2015-05-19 NOTE — Progress Notes (Signed)
Occupational Therapy Treatment Patient Details Name: [name] MRN: [id number] DOB: 11/15/1923 Today's Date: 05/19/2015    History of present illness 82 yo female s/p LTHR direct anterior approach. PMHx:LTKR, HTN, SOB on exertion   OT comments  Patient progressing nicely towards OT goals, continue plan of care for now. Patient's hgb=7.0, asymptomatic and willing to work with therapist this morning. Pt eager to go to rehab "for about a week until I get strong enough to go home".    Follow Up Recommendations  SNF;Supervision/Assistance - 24 hour    Equipment Recommendations  Other (comment) (TBD next venue of care)    Recommendations for Other Services  None at this time   Precautions / Restrictions Precautions Precautions: Fall Restrictions Weight Bearing Restrictions: Yes LLE Weight Bearing: Weight bearing as tolerated    Mobility Bed Mobility Overal bed mobility: Needs Assistance Bed Mobility: Supine to Sit     Supine to sit: HOB elevated;Supervision     General bed mobility comments: Supervision for safety, pt able to manage LLE with extra time  Transfers Overall transfer level: Needs assistance Equipment used: Rolling walker (2 wheeled) Transfers: Sit to/from Stand Sit to Stand: Min guard;Supervision         General transfer comment: Supervision to min guard for safety. Very minimal cueing needed for safety and technique.     Balance Overall balance assessment: Needs assistance Sitting-balance support: No upper extremity supported;Feet supported Sitting balance-Leahy Scale: Good     Standing balance support: During functional activity;Single extremity supported Standing balance-Leahy Scale: Fair Standing balance comment: Pt standing at sink for grooming tasks with one UE supported   ADL Overall ADL's : Needs assistance/impaired Eating/Feeding: Set up;Bed level   Grooming: Supervision/safety;Set up;Standing Grooming Details (indicate cue type and  reason): at sink Upper Body Bathing: Set up;Supervision/ safety;Standing Upper Body Bathing Details (indicate cue type and reason): at sink Lower Body Bathing: Min guard;Sit to/from stand   Upper Body Dressing : Set up;Sitting   Lower Body Dressing: Min guard;Sit to/from stand   Toilet Transfer: Min guard;Supervision/safety;RW;Ambulation;BSC   Toileting- ArchitectClothing Manipulation and Hygiene: Min guard;Sit to/from Nurse, children'sstand     Tub/Shower Transfer Details (indicate cue type and reason): did not occur   General ADL Comments: Pt motivated and willing to go to SNF. Pt states she has been ambulating to/from BR prn with staff present. Pt able to ambulate to/from BR with OT with supervision to min guard assist, very minimal cueing for safety and technique.  Pt able to perform LB ADLs with no AE needed, but minimally increased pain. Depending on progress and pain management, pt may need AE.      Cognition   Behavior During Therapy: WFL for tasks assessed/performed Overall Cognitive Status: Within Functional Limits for tasks assessed                 Pertinent Vitals/ Pain       Pain Assessment: 0-10 Pain Score: 4  Pain Location: LLE/hip Pain Descriptors / Indicators: Aching;Grimacing;Guarding Pain Intervention(s): Limited activity within patient's tolerance;Monitored during session;Repositioned   Frequency Min 2X/week     Progress Toward Goals  OT Goals(current goals can now befound in the care plan section)  Progress towards OT goals: Progressing toward goals  Acute Rehab OT Goals Patient Stated Goal: go to rehab for a week and then home OT Goal Formulation: With patient Time For Goal Achievement: 05/25/15 Potential to Achieve Goals: Good  Plan Discharge plan remains appropriate    End of Session  Equipment Utilized During Treatment: Gait belt;Rolling walker   Activity Tolerance Patient tolerated treatment well   Patient Left in chair;with call bell/phone within reach;with  chair alarm set    Time: [phone] OT Time Calculation (min): 19 min  Charges: OT General Charges $OT Visit: 1 Procedure OT Treatments $Self Care/Home Management : 8-22 mins  Edwin Cap , MS, OTR/L, CLT Pager: [phone]  05/19/2015, 9:33 AM

## 2015-05-19 NOTE — Clinical Documentation Improvement (Signed)
Orthopedic  Can the diagnosis of obesity be further specified?   Obesity with BMI of 38 to 3.9  Other  Clinically Undetermined  Document any associated diagnoses/conditions.   Supporting Information: BMI 38.15   Please exercise your independent, professional judgment when responding. A specific answer is not anticipated or expected.   Thank Modesta MessingYou,  Keeshia Sanderlin L [facility] [place] [phone]

## 2015-05-19 NOTE — Progress Notes (Signed)
UR COMPLETED  

## 2015-05-19 NOTE — Progress Notes (Addendum)
Physical Therapy Treatment Patient Details Name: [name] MRN: [id number] DOB: 1923/03/30 Today's Date: 05/19/2015    History of Present Illness 82 yo female s/p LTHR direct anterior approach. PMHx:LTKR, HTN, SOB on exertion.    PT Comments    Pt Hgb 7.0. Pt in recliner from OT session this AM. She was c/o increased pain L hip. Pt agreeable to participation in PT. After ambulation, she returned to bed for exercises. RN gave pain meds during session.  Follow Up Recommendations  SNF     Equipment Recommendations  None recommended by PT    Recommendations for Other Services       Precautions / Restrictions Precautions Precautions: Fall Restrictions Weight Bearing Restrictions: Yes LLE Weight Bearing: Weight bearing as tolerated    Mobility  Bed Mobility Overal bed mobility: Needs Assistance Bed Mobility: Supine to Sit     Supine to sit: HOB elevated;Supervision Sit to supine: Min assist   General bed mobility comments: assist with LLE into bed  Transfers Overall transfer level: Needs assistance Equipment used: Rolling walker (2 wheeled) Transfers: Sit to/from Stand Sit to Stand: Min guard         General transfer comment: Verbal cues for hand placement and sequencing. Multi attempts to stand at min guard assist level.  Ambulation/Gait Ambulation/Gait assistance: Min guard Ambulation Distance (Feet): 100 Feet Assistive device: Rolling walker (2 wheeled) Gait Pattern/deviations: Step-through pattern;Decreased weight shift to left;Decreased stance time - left;Trunk flexed Gait velocity: decreased   General Gait Details: verbal cues for posture and safe RW management   Stairs            Wheelchair Mobility    Modified Rankin (Stroke Patients Only)       Balance Overall balance assessment: Needs assistance Sitting-balance support: No upper extremity supported;Feet supported Sitting balance-Leahy Scale: Good     Standing balance support:  During functional activity;Single extremity supported Standing balance-Leahy Scale: Fair Standing balance comment: Pt standing at sink for grooming tasks with one UE supported                    Cognition Arousal/Alertness: Awake/alert Behavior During Therapy: WFL for tasks assessed/performed Overall Cognitive Status: Within Functional Limits for tasks assessed                      Exercises Total Joint Exercises Ankle Circles/Pumps: AROM;Both;10 reps;Supine Quad Sets: AROM;Both;10 reps;Supine Gluteal Sets: AROM;Both;10 reps;Supine Hip ABduction/ADduction: AAROM;Left;10 reps;Supine Straight Leg Raises: AAROM;Left;5 reps;Supine    General Comments        Pertinent Vitals/Pain Pain Assessment: 0-10 Pain Score: 8  Pain Location: L hip Pain Descriptors / Indicators: Aching;Sore;Grimacing Pain Intervention(s): Limited activity within patient's tolerance;Monitored during session;Repositioned;RN gave pain meds during session    Home Living Family/patient expects to be discharged to:: Skilled nursing facility Living Arrangements: Alone Available Help at Discharge: Family;Available PRN/intermittently Type of Home: House Home Access: Stairs to enter Entrance Stairs-Rails: Can reach both;Right;Left Home Layout: One level        Prior Function            PT Goals (current goals can now be found in the care plan section) Acute Rehab PT Goals Patient Stated Goal: go to rehab for a week and then home PT Goal Formulation: With patient Time For Goal Achievement: 05/25/15 Potential to Achieve Goals: Good Progress towards PT goals: Progressing toward goals    Frequency  7X/week    PT Plan Current plan remains appropriate  Co-evaluation             End of Session Equipment Utilized During Treatment: Gait belt Activity Tolerance: Patient tolerated treatment well Patient left: in bed;with bed alarm set;with call bell/phone within reach     Time:  [id number] PT Time Calculation (min) (ACUTE ONLY): 24 min  Charges:  $Gait Training: 8-22 mins $Therapeutic Exercise: 8-22 mins                    G Codes:      [name], [name] 05/19/2015, 11:26 AM

## 2015-05-19 NOTE — Progress Notes (Signed)
Subjective: 2 Days Post-Op Procedure(s) (LRB): LEFT TOTAL HIP ARTHROPLASTY ANTERIOR APPROACH (Left) Patient reports pain as moderate.  Less pain today left hip. Reports dizziness with standing and increased SOB with activity.  Objective: Vital signs in last 24 hours: Temp:  [98 F (36.7 C)-99.5 F (37.5 C)] 99.5 F (37.5 C) (04/06 0448) Pulse Rate:  [63-82] 65 (04/06 0448) Resp:  [16-20] 16 (04/06 0448) BP: (107-125)/(41-47) 107/43 mmHg (04/06 0448) SpO2:  [97 %-100 %] 100 % (04/06 0448)  Intake/Output from previous day: 04/05 0701 - 04/06 0700 In: 1497.5 [P.O.:720; I.V.:777.5] Out: -  Intake/Output this shift:     Recent Labs  05/18/15 0611 05/19/15 0412  HGB 9.3* 7.0*    Recent Labs  05/18/15 0611 05/19/15 0412  WBC 6.1 5.1  RBC 3.05* 2.39*  HCT 27.9* 21.5*  PLT 201 198    Recent Labs  05/18/15 0611  NA 139  K 4.1  CL 101  CO2 26  BUN 17  CREATININE 1.07*  GLUCOSE 124*  CALCIUM 8.8*   No results for input(s): LABPT, INR in the last 72 hours.   Left lower extremity: Sensation intact distally Intact pulses distally Dorsiflexion/Plantar flexion intact Incision: scant drainage Compartment soft  Assessment/Plan: 2 Days Post-Op Procedure(s) (LRB): LEFT TOTAL HIP ARTHROPLASTY ANTERIOR APPROACH (Left)  Acute on chronic anemia will transfuse PRBC's today.   [name] 05/19/2015, 9:22 AM

## 2015-05-19 NOTE — Clinical Social Work Note (Signed)
Clinical Social Work Assessment  Patient Details  Name: [name] MRN: [id number] Date of Birth: 09/28/1923  Date of referral:  05/19/15               Reason for consult:  Facility Placement                Permission sought to share information with:  Facility Medical sales representativeContact Representative, Case Manager, Family Supports Permission granted to share information::  Yes, Release of Information Signed  Name::      Helen Hashimoto(Carolyn Ransom)  Agency::     Relationship::   (daughter)  Contact Information:   [phone])  Housing/Transportation Living arrangements for the past 2 months:  Single Family Home Source of Information:  Patient Patient Interpreter Needed:  None Criminal Activity/Legal Involvement Pertinent to Current Situation/Hospitalization:  No - Comment as needed Significant Relationships:  Adult Children Lives with:  Self Do you feel safe going back to the place where you live?  Yes Need for family participation in patient care:  Yes (Comment)  Care giving concerns:  Patient just had hip surgery and does not have 24 care at home.    Social Worker assessment / plan:  BSW intern enters patient room, patient is alert and oriented up watching TV. BSW intern explained PT recommendation and referral process. Patient is agreeable to SNF with hopes to return home after rehab. Patient does live alone but has a daughter and son that come by and check on her.  Patient does use walker. Patient daughter Helen Hashimoto(Carolyn Ransom) [phone] is the patient emergency contact person.   Employment status:  Retired Health and safety inspectornsurance information:  Medicare PT Recommendations:  Skilled Nursing Facility Information / Referral to community resources:  Acute Rehab  Patient/Family's Response to care:  Patient has good response to care.  Patient/Family's Understanding of and Emotional Response to Diagnosis, Current Treatment, and Prognosis:  Patient has a good understanding of current condition and d/c plans.  Emotional  Assessment Appearance:  Appears stated age Attitude/Demeanor/Rapport:   (welcoming, friendly) Affect (typically observed):  Accepting, Pleasant, Calm Orientation:  Oriented to Self, Oriented to Place, Oriented to  Time, Oriented to Situation Alcohol / Substance use:  Never Used Psych involvement (Current and /or in the community):  No (Comment)  Discharge Needs  Concerns to be addressed:  No discharge needs identified Readmission within the last 30 days:  No Current discharge risk:  None Barriers to Discharge:  No Barriers Identified   Catheryn Baconharlean McNeil  BSW intern  [phone]

## 2015-05-19 NOTE — Clinical Documentation Improvement (Signed)
Orthopedic  Can the diagnosis of blood loss anemia be further specified?   Acute Blood Loss Anemia, from hip surgery  Other  Clinically Undetermined  Document any associated diagnoses/conditions. - already documented as acute on chronic anemia   Supporting Information: Hgb on admission was 9.3 and on 05/19/15 is 7.0 and is receiving blood.   Please exercise your independent, professional judgment when responding. A specific answer is not anticipated or expected.   Thank Modesta MessingYou,  Omri Bertran L [facility] Information Management [place] [phone]

## 2015-05-19 NOTE — Clinical Social Work Placement (Addendum)
   CLINICAL SOCIAL WORK PLACEMENT  NOTE  Date:  05/19/2015  Patient Details  Name: [name] MRN: [id number] Date of Birth: 06/19/1923  Clinical Social Work is seeking post-discharge placement for this patient at the Skilled  Nursing Facility level of care (*CSW will initial, date and re-position this form in  chart as items are completed):      Patient/family provided with [facility] Clinical Social Work Department's list of facilities offering this level of care within the geographic area requested by the patient (or if unable, by the patient's family).      Patient/family informed of their freedom to choose among providers that offer the needed level of care, that participate in Medicare, Medicaid or managed care program needed by the patient, have an available bed and are willing to accept the patient.      Patient/family informed of [facility] ownership interest in [facility] and [facility], as well as of the fact that they are under no obligation to receive care at these facilities.  PASRR submitted to EDS on 05/19/15     PASRR number received on 05/19/15     Existing PASRR number confirmed on       FL2 transmitted to all facilities in geographic area requested by pt/family on 05/19/15     FL2 transmitted to all facilities within larger geographic area on       Patient informed that his/her managed care company has contracts with or will negotiate with certain facilities, including the following:            Patient/family informed of bed offers received. 4/6  [facility]  Physician recommends and patient chooses bed at      [facility] 4[facility]  Patient to be transferred to facility by     [facility]  Patient family notified on  4/7 of transfer.   Name of family member notified:      [name]  PHYSICIAN Please sign FL2     Additional Comment:     _______________________________________________ Mearl LatinNadia S Rayyan, LCSWA 05/19/2015, 6:13 PM   Merlyn LotJenna Holoman, LCSWA Clinical Social Worker [phone]

## 2015-05-20 LAB — TYPE AND SCREEN
ABO/RH(D): B POS
Antibody Screen: NEGATIVE
Unit division: 0
Unit division: 0

## 2015-05-20 LAB — CBC
HCT: 24.7 % — ABNORMAL LOW (ref 36.0–46.0)
Hemoglobin: 8.6 g/dL — ABNORMAL LOW (ref 12.0–15.0)
MCH: 30.6 pg (ref 26.0–34.0)
MCHC: 34.8 g/dL (ref 30.0–36.0)
MCV: 87.9 fL (ref 78.0–100.0)
Platelets: 188 10*3/uL (ref 150–400)
RBC: 2.81 MIL/uL — ABNORMAL LOW (ref 3.87–5.11)
RDW: 15.2 % (ref 11.5–15.5)
WBC: 6.1 10*3/uL (ref 4.0–10.5)

## 2015-05-20 MED ORDER — HYDROCODONE-ACETAMINOPHEN 5-325 MG PO TABS: 1.0000 | ORAL_TABLET | ORAL | Status: DC | PRN

## 2015-05-20 MED ORDER — METHOCARBAMOL 500 MG PO TABS: 500.0000 mg | ORAL_TABLET | Freq: Four times a day (QID) | ORAL | Status: DC | PRN

## 2015-05-20 MED ORDER — ASPIRIN 325 MG PO TBEC: 325.0000 mg | DELAYED_RELEASE_TABLET | Freq: Every day | ORAL | Status: DC

## 2015-05-20 NOTE — Progress Notes (Signed)
Physical Therapy Treatment Patient Details Name: Brenda GladeBertha C [name] MRN: [id number] DOB: 05/09/1923 Today's Date: 05/20/2015    History of Present Illness 82 yo female s/p LTHR direct anterior approach. PMHx:LTKR, HTN, SOB on exertion.    PT Comments    Pt performed increased gait distance with fatigue noted and mild pain in left hip.  Pt performed increased therapeutic exercise.  Pt set for d/c to SNF today.    Follow Up Recommendations  SNF     Equipment Recommendations  None recommended by PT    Recommendations for Other Services       Precautions / Restrictions Precautions Precautions: Fall Restrictions Weight Bearing Restrictions: Yes LLE Weight Bearing: Weight bearing as tolerated    Mobility  Bed Mobility Overal bed mobility: Needs Assistance Bed Mobility: Supine to Sit     Supine to sit: HOB elevated;Supervision Sit to supine: Supervision   General bed mobility comments: assist with LLE into bed  Transfers Overall transfer level: Needs assistance Equipment used: Rolling walker (2 wheeled) Transfers: Sit to/from Stand Sit to Stand: Min guard         General transfer comment: Verbal cues for hand placement and sequencing. Multi attempts to stand at min guard assist level.  Ambulation/Gait Ambulation/Gait assistance: Min guard Ambulation Distance (Feet): 180 Feet Assistive device: Rolling walker (2 wheeled) Gait Pattern/deviations: Step-through pattern;Decreased stride length;Trunk flexed Gait velocity: decreased   General Gait Details: Cues for sequencing and Rw placement, cues for forward gaze.  Pt able to advance gait distance.     Stairs            Wheelchair Mobility    Modified Rankin (Stroke Patients Only)       Balance Overall balance assessment: Needs assistance Sitting-balance support: No upper extremity supported;Feet unsupported Sitting balance-Leahy Scale: Good       Standing balance-Leahy Scale: Fair                       Cognition Arousal/Alertness: Awake/alert Behavior During Therapy: WFL for tasks assessed/performed Overall Cognitive Status: Within Functional Limits for tasks assessed                      Exercises Total Joint Exercises Ankle Circles/Pumps: AROM;Both;10 reps;Supine Quad Sets: AROM;Both;10 reps;Supine Gluteal Sets: AROM;Both;10 reps;Supine Heel Slides: AAROM;Left;10 reps;Supine Hip ABduction/ADduction: AAROM;Left;10 reps;Supine Straight Leg Raises: AAROM;Left;Supine;10 reps    General Comments        Pertinent Vitals/Pain Pain Assessment: Faces Faces Pain Scale: Hurts little more Pain Location: L hip report staples feel like they are pulling.  Pain Intervention(s): Limited activity within patient's tolerance;Monitored during session;Repositioned;RN gave pain meds during session    Home Living                      Prior Function            PT Goals (current goals can now be found in the care plan section) Acute Rehab PT Goals Patient Stated Goal: go to rehab for a week and then home Potential to Achieve Goals: Good Progress towards PT goals: Progressing toward goals    Frequency  7X/week    PT Plan Current plan remains appropriate    Co-evaluation             End of Session Equipment Utilized During Treatment: Gait belt Activity Tolerance: Patient tolerated treatment well Patient left: with call bell/phone within reach;in chair;with chair alarm set  Time: 1610-9604 PT Time Calculation (min) (ACUTE ONLY): 23 min  Charges:  $Gait Training: 8-22 mins $Therapeutic Exercise: 8-22 mins                    G Codes:      [name] 2015-05-23, 9:16 AM  [name], PTA pager [phone]

## 2015-05-20 NOTE — Care Management Note (Signed)
Case Management Note  Patient Details  Name: Wilder GladeBertha C Hymon MRN: [id number] Date of Birth: 04/25/1923  Subjective/Objective:               Admitted with osteoarthritis of L hip,  s/p  Left total hip arthroplasty 05/17/15.   Action/Plan: Plan to d/c to SNF/REHAB today.  Expected Discharge Date:    05/20/2015            Expected Discharge Plan:  Skilled Nursing Facility  In-House Referral:  Clinical Social Work  Discharge planning Services  CM Consult  Post Acute Care Choice:    Choice offered to:     DME Arranged:    DME Agency:     HH Arranged:    HH Agency:     Status of Service:  Completed, signed off  Medicare Important Message Given:  Yes Date Medicare IM Given:    Medicare IM give by:    Date Additional Medicare IM Given:    Additional Medicare Important Message give by:     If discussed at Long Length of Stay Meetings, dates discussed:    Additional Comments:  Epifanio LeschesCole, Kanen Mottola Hudson, ArizonaRN,BSN,CM [phone] 05/20/2015, 11:50 AM

## 2015-05-20 NOTE — Care Management Important Message (Signed)
Important Message  Patient Details  Name: [name] MRN: [id number] Date of Birth: 05/20/1923   Medicare Important Message Given:  Yes    Kyla BalzarineShealy, Dryden Tapley Abena 05/20/2015, 10:33 AM

## 2015-05-20 NOTE — Progress Notes (Signed)
Nsg Discharge Note  Admit Date:  05/17/2015 Discharge date: 05/20/2015   [name] to be D/C'd [facility] per MD order.  AVS completed.  Copy for chart, and copy for patient signed, and dated. Patient/caregiver able to verbalize understanding.  Discharge Medication:   Medication List    STOP taking these medications        diclofenac sodium 1 % Gel  Commonly known as:  VOLTAREN      TAKE these medications        acetaminophen 325 MG tablet  Commonly known as:  TYLENOL  Take 650 mg by mouth every 6 (six) hours as needed. pain     aspirin 325 MG EC tablet  Take 1 tablet (325 mg total) by mouth daily.     esomeprazole 40 MG capsule  Commonly known as:  NEXIUM  Take 40 mg by mouth daily before breakfast.     ferrous sulfate 325 (65 FE) MG tablet  Take 325 mg by mouth daily with breakfast.     HYDROcodone-acetaminophen 5-325 MG tablet  Commonly known as:  NORCO/VICODIN  Take 1-2 tablets by mouth every 4 (four) hours as needed (breakthrough pain).     losartan-hydrochlorothiazide 100-12.5 MG tablet  Commonly known as:  HYZAAR  Take 1 tablet by mouth daily.     methocarbamol 500 MG tablet  Commonly known as:  ROBAXIN  Take 1 tablet (500 mg total) by mouth every 6 (six) hours as needed for muscle spasms.     metoprolol tartrate 25 MG tablet  Commonly known as:  LOPRESSOR  Take 25 mg by mouth 2 (two) times daily.     multivitamin with minerals Tabs tablet  Take 1 tablet by mouth daily.     nitroGLYCERIN 0.4 MG SL tablet  Commonly known as:  NITROSTAT  Place 0.4 mg under the tongue every 5 (five) minutes as needed for chest pain.     potassium chloride 10 MEQ tablet  Commonly known as:  K-DUR  Take 10 mEq by mouth daily.     Vitamin D3 2000 units Tabs  Take 2,000 Units by mouth daily.        Discharge Assessment: Filed Vitals:   05/19/15 2112 05/20/15 0427  BP: 103/58 113/64  Pulse: 61 70  Temp: 98 F (36.7 C) 98.9 F (37.2 C)  Resp: 18 14   Skin clean, dry and intact without evidence of skin break down, no evidence of skin tears noted. IV catheter discontinued intact. Site without signs and symptoms of complications - no redness or edema noted at insertion site, patient denies c/o pain - only slight tenderness at site.  Dressing with slight pressure applied.  D/c Instructions-Education: Discharge instructions given to patient/family with verbalized understanding. D/c education completed with patient/family including follow up instructions, medication list, d/c activities limitations if indicated, with other d/c instructions as indicated by MD - patient able to verbalize understanding, all questions fully answered. Patient instructed to return to ED, call 911, or call MD for any changes in condition.  [facility] Report given to nurse receiving patient.  Camillo FlamingVicki L Domitila Stetler, RN 05/20/2015 11:20 AM

## 2015-05-20 NOTE — Progress Notes (Signed)
[facility] Anticipated discharge date:4/7 Family notified: pt dtr, Designer, jewelleryCarolyn Transportation by [employer]- 11:30am  CSW signing off.  Merlyn LotJenna Holoman, LCSWA Clinical Social Worker [phone]

## 2015-05-20 NOTE — Discharge Summary (Signed)
Patient ID: [name] MRN: [id number] DOB/AGE: [birth year] 82 y.o.  Admit date: 05/17/2015 Discharge date: 05/20/2015  Admission Diagnoses:  Principal Problem:   Osteoarthritis of left hip Active Problems:   Status post total replacement of left hip   Discharge Diagnoses:  Same  Past Medical History  Diagnosis Date  . Diverticulitis   . History of GI diverticular bleed   . High cholesterol     can't take the meds  . HTN (hypertension)     takes Losartan-HCTZ and Metoprolol daily  . Anemia     takes Ferrous Sulfate daily  . Acid reflux     takes Nexium daily  . Shortness of breath dyspnea     occasionally and with exertion  . Numbness     in legs  . Arthritis   . Joint pain   . Joint swelling   . Peripheral edema     occasionally  . Slow urinary stream     at times  . Nocturia   . Hematochezia 03/2015  . History of blood transfusion 03/2015    no abnormal reaction noted  . Aortic stenosis     mild AS pk grad 18, mean grad 9, AVA 1.32 cm 03/2015 echo (Dr. Jacinto Halim)    Surgeries: Procedure(s): LEFT TOTAL HIP ARTHROPLASTY ANTERIOR APPROACH on 05/17/2015   Consultants:    Discharged Condition: Improved  Hospital Course: [name] is an 82 y.o. female who was admitted 05/17/2015 for operative treatment ofOsteoarthritis of left hip. Patient has severe unremitting pain that affects sleep, daily activities, and work/hobbies. After pre-op clearance the patient was taken to the operating room on 05/17/2015 and underwent  Procedure(s): LEFT TOTAL HIP ARTHROPLASTY ANTERIOR APPROACH.    Patient was given perioperative antibiotics: Anti-infectives    Start     Dose/Rate Route Frequency Ordered Stop   05/17/15 2100  ceFAZolin (ANCEF) IVPB 1 g/50 mL premix     1 g 100 mL/hr over 30 Minutes Intravenous Every 6 hours 05/17/15 1933 05/18/15 0235   05/17/15 1500  ceFAZolin (ANCEF) IVPB 2g/100 mL premix     2 g 200 mL/hr over 30 Minutes Intravenous To ShortStay Surgical 05/16/15  1301 05/17/15 1545       Patient was given sequential compression devices, early ambulation, and chemoprophylaxis to prevent DVT.  Patient benefited maximally from hospital stay and there were no complications.  She did receive a transfusion due to symptomatic acute blood loss anemia.  Recent vital signs: Patient Vitals for the past 24 hrs:  BP Temp Temp src Pulse Resp SpO2  05/20/15 0427 113/64 mmHg 98.9 F (37.2 C) - 70 14 100 %  05/19/15 2112 (!) 103/58 mmHg 98 F (36.7 C) Oral 61 18 100 %  05/19/15 1925 (!) 137/53 mmHg 98.2 F (36.8 C) Oral 75 20 100 %  05/19/15 1720 (!) 125/56 mmHg 98.7 F (37.1 C) Oral 65 18 100 %  05/19/15 1644 (!) 102/58 mmHg 98.7 F (37.1 C) Oral 60 20 100 %  05/19/15 1537 (!) 110/51 mmHg 98.9 F (37.2 C) Oral 63 16 100 %  05/19/15 1334 (!) 110/49 mmHg 97.8 F (36.6 C) Oral 63 18 98 %  05/19/15 1303 (!) 98/45 mmHg 98.8 F (37.1 C) Oral (!) 59 18 100 %     Recent laboratory studies:  Recent Labs  05/18/15 0611  05/19/15 2100 05/20/15 0525  WBC 6.1  < > 6.4 6.1  HGB 9.3*  < > 9.4* 8.6*  HCT 27.9*  < >  28.8* 24.7*  PLT 201  < > 207 188  NA 139  --   --   --   K 4.1  --   --   --   CL 101  --   --   --   CO2 26  --   --   --   BUN 17  --   --   --   CREATININE 1.07*  --   --   --   GLUCOSE 124*  --   --   --   CALCIUM 8.8*  --   --   --   < > = values in this interval not displayed.   Discharge Medications:     Medication List    STOP taking these medications        diclofenac sodium 1 % Gel  Commonly known as:  VOLTAREN      TAKE these medications        acetaminophen 325 MG tablet  Commonly known as:  TYLENOL  Take 650 mg by mouth every 6 (six) hours as needed. pain     aspirin 325 MG EC tablet  Take 1 tablet (325 mg total) by mouth daily.     esomeprazole 40 MG capsule  Commonly known as:  NEXIUM  Take 40 mg by mouth daily before breakfast.     ferrous sulfate 325 (65 FE) MG tablet  Take 325 mg by mouth daily with  breakfast.     HYDROcodone-acetaminophen 5-325 MG tablet  Commonly known as:  NORCO/VICODIN  Take 1-2 tablets by mouth every 4 (four) hours as needed (breakthrough pain).     losartan-hydrochlorothiazide 100-12.5 MG tablet  Commonly known as:  HYZAAR  Take 1 tablet by mouth daily.     methocarbamol 500 MG tablet  Commonly known as:  ROBAXIN  Take 1 tablet (500 mg total) by mouth every 6 (six) hours as needed for muscle spasms.     metoprolol tartrate 25 MG tablet  Commonly known as:  LOPRESSOR  Take 25 mg by mouth 2 (two) times daily.     multivitamin with minerals Tabs tablet  Take 1 tablet by mouth daily.     nitroGLYCERIN 0.4 MG SL tablet  Commonly known as:  NITROSTAT  Place 0.4 mg under the tongue every 5 (five) minutes as needed for chest pain.     potassium chloride 10 MEQ tablet  Commonly known as:  K-DUR  Take 10 mEq by mouth daily.     Vitamin D3 2000 units Tabs  Take 2,000 Units by mouth daily.        Diagnostic Studies: Dg Hip Port Unilat With Pelvis 1v Left  05/17/2015  CLINICAL DATA:  Immediate postop (postop day 0) left total hip arthroplasty, anterior approach. EXAM: DG HIP (WITH OR WITHOUT PELVIS) 1V PORT LEFT 1731 hr: COMPARISON:  Intraoperative left hip x-rays earlier today 1648 hr. FINDINGS: Anatomic alignment post left total hip arthroplasty. No acute complicating features. Gas in the overlying soft tissues as expected. No visible intra-articular gas. IMPRESSION: Anatomic alignment post left total hip arthroplasty without acute complicating features. Electronically Signed   By: Hulan Saas M.D.   On: 05/17/2015 17:53   Dg Hip Operative Unilat W Or W/o Pelvis Left  05/17/2015  CLINICAL DATA:  Left total hip arthroplasty anterior approach EXAM: OPERATIVE LEFT HIP (WITH PELVIS IF PERFORMED) 2 VIEWS TECHNIQUE: Fluoroscopic spot image(s) were submitted for interpretation post-operatively. COMPARISON:  None. FINDINGS: Fluoroscopy Time:  0 minutes  12 seconds  Number of Acquired Images:  2 Components of total hip replacement appear in anticipated position. IMPRESSION: Fluoroscopic confirmation of total hip replacement components Electronically Signed   By: Esperanza Heiraymond  Rubner M.D.   On: 05/17/2015 17:02    Disposition: Skilled Nursing Facility      Discharge Instructions    Discharge patient    Complete by:  As directed            Follow-up Information    Follow up with Kathryne HitchBLACKMAN,Beni Turrell Y, MD In 2 weeks.   Specialty:  Orthopedic Surgery   Contact information:   [address] [place] [address] [phone]        Signed: Kathryne HitchBLACKMAN,Audi Conover Y 05/20/2015, 6:56 AM

## 2015-05-20 NOTE — Progress Notes (Signed)
Subjective: 3 Days Post-Op Procedure(s) (LRB): LEFT TOTAL HIP ARTHROPLASTY ANTERIOR APPROACH (Left) Patient reports pain as moderate.  Did well with transfusion yesterday.  Working with PT.  SNF recommended.  Objective: Vital signs in last 24 hours: Temp:  [97.8 F (36.6 C)-98.9 F (37.2 C)] 98.9 F (37.2 C) (04/07 0427) Pulse Rate:  [59-75] 70 (04/07 0427) Resp:  [14-20] 14 (04/07 0427) BP: (98-137)/(45-64) 113/64 mmHg (04/07 0427) SpO2:  [98 %-100 %] 100 % (04/07 0427)  Intake/Output from previous day: 04/06 0701 - 04/07 0700 In: 2219 [P.O.:760; I.V.:775; Blood:684] Out: -  Intake/Output this shift: Total I/O In: 1142.5 [P.O.:300; I.V.:525; Blood:317.5] Out: -    Recent Labs  05/18/15 0611 05/19/15 0412 05/19/15 2100 05/20/15 0525  HGB 9.3* 7.0* 9.4* 8.6*    Recent Labs  05/19/15 2100 05/20/15 0525  WBC 6.4 6.1  RBC 3.26* 2.81*  HCT 28.8* 24.7*  PLT 207 188    Recent Labs  05/18/15 0611  NA 139  K 4.1  CL 101  CO2 26  BUN 17  CREATININE 1.07*  GLUCOSE 124*  CALCIUM 8.8*   No results for input(s): LABPT, INR in the last 72 hours.  Sensation intact distally Intact pulses distally Dorsiflexion/Plantar flexion intact Incision: scant drainage  Assessment/Plan: 3 Days Post-Op Procedure(s) (LRB): LEFT TOTAL HIP ARTHROPLASTY ANTERIOR APPROACH (Left) Up with therapy Discharge to SNF today.  [name] Y 05/20/2015, 6:53 AM

## 2015-05-20 NOTE — Discharge Instructions (Signed)

## 2015-05-23 ENCOUNTER — Encounter: Payer: Self-pay | Admitting: Adult Health

## 2015-05-23 ENCOUNTER — Non-Acute Institutional Stay (SKILLED_NURSING_FACILITY): Payer: Medicare Other | Admitting: Adult Health

## 2015-05-23 DIAGNOSIS — K274 Chronic or unspecified peptic ulcer, site unspecified, with hemorrhage: Secondary | ICD-10-CM | POA: Diagnosis not present

## 2015-05-23 DIAGNOSIS — M1612 Unilateral primary osteoarthritis, left hip: Secondary | ICD-10-CM | POA: Diagnosis not present

## 2015-05-23 DIAGNOSIS — D62 Acute posthemorrhagic anemia: Secondary | ICD-10-CM | POA: Diagnosis not present

## 2015-05-23 DIAGNOSIS — I10 Essential (primary) hypertension: Secondary | ICD-10-CM | POA: Diagnosis not present

## 2015-05-23 DIAGNOSIS — K219 Gastro-esophageal reflux disease without esophagitis: Secondary | ICD-10-CM

## 2015-05-23 DIAGNOSIS — Z96649 Presence of unspecified artificial hip joint: Secondary | ICD-10-CM

## 2015-05-23 DIAGNOSIS — Z96642 Presence of left artificial hip joint: Secondary | ICD-10-CM

## 2015-05-23 HISTORY — DX: Gastro-esophageal reflux disease without esophagitis: K21.9

## 2015-05-23 NOTE — Progress Notes (Signed)
Patient ID: [name], female   DOB: 08/19/1923, 82 y.o.   MRN: [id number]   [facility]       Allergies  Allergen Reactions  . Ezetimibe Anaphylaxis  . Lipitor [Atorvastatin] Swelling  . Pravachol [Pravastatin] Swelling  . Statins Swelling and Other (See Comments)    Swelling of mouth and lips  . Zocor [Simvastatin] Swelling  . Cholestyramine Nausea And Vomiting    Chief Complaint  Patient presents with  . Hospitalization Follow-up    Hospital Follow up    HPI:  She has been hospitalized for a left hip replacement. She did require a transfusion while hospitalized. She is here for short term rehab with her goal to return back home. She is not voicing any concerns at this time. There are no nursing concerns at this time.    Past Medical History  Diagnosis Date  . Diverticulitis   . History of GI diverticular bleed   . High cholesterol     can't take the meds  . HTN (hypertension)     takes Losartan-HCTZ and Metoprolol daily  . Anemia     takes Ferrous Sulfate daily  . Acid reflux     takes Nexium daily  . Shortness of breath dyspnea     occasionally and with exertion  . Numbness     in legs  . Arthritis   . Joint pain   . Joint swelling   . Peripheral edema     occasionally  . Slow urinary stream     at times  . Nocturia   . Hematochezia 03/2015  . History of blood transfusion 03/2015    no abnormal reaction noted  . Aortic stenosis     mild AS pk grad 18, mean grad 9, AVA 1.32 cm 03/2015 echo (Dr. Jacinto HalimGanji)    Past Surgical History  Procedure Laterality Date  . Abcess drainage      abdominal abcess  . Cholecystectomy    . Dilation and curettage of uterus    . Esophagogastroduodenoscopy N/A 02/23/2015    Procedure: ESOPHAGOGASTRODUODENOSCOPY (EGD);  Surgeon: Carman ChingJames Edwards, MD;  [facility];  Service: Endoscopy;  Laterality: N/A;  . Flexible sigmoidoscopy N/A 02/24/2015    Procedure: FLEXIBLE SIGMOIDOSCOPY;  Surgeon: Carman ChingJames Edwards,  MD;  [facility];  Service: Endoscopy;  Laterality: N/A;  . Cataract surgery Bilateral   . Left knee surgery    . Total hip arthroplasty Left 05/17/2015    Procedure: LEFT TOTAL HIP ARTHROPLASTY ANTERIOR APPROACH;  Surgeon: Kathryne Hitchhristopher Y Blackman, MD;  [facility];  Service: Orthopedics;  Laterality: Left;    Family History  Problem Relation Age of Onset  . Diabetes Other   . Hypertension Other   . Cancer Other   . Heart disease Other      Social History   Social History  . Marital Status: Widowed    Spouse Name: N/A  . Number of Children: N/A  . Years of Education: N/A   Occupational History  . Not on file.   Social History Main Topics  . Smoking status: Former Games developermoker  . Smokeless tobacco: Never Used     Comment: quit smoking 8+ yrs ago  . Alcohol Use: No     Comment: quit yrs ago  . Drug Use: No  . Sexual Activity: Not Currently   Other Topics Concern  . Not on file   Social History Narrative     VITAL SIGNS BP 109/59 mmHg  Pulse  52  Temp(Src) 98.2 F (36.8 C) (Oral)  Resp 20  Ht  (1.651 m)  Wt 200 lb 4 oz (90.833 kg)  BMI 33.32 kg/m2  SpO2 95%  Patient's Medications  New Prescriptions   No medications on file  Previous Medications   ACETAMINOPHEN (TYLENOL) 325 MG TABLET    Take 650 mg by mouth every 6 (six) hours as needed. pain   ASPIRIN EC 325 MG EC TABLET    Take 1 tablet (325 mg total) by mouth daily.   CHOLECALCIFEROL (VITAMIN D3) 2000 UNITS TABS    Take 2,000 Units by mouth daily.   ESOMEPRAZOLE (NEXIUM) 40 MG CAPSULE    Take 40 mg by mouth daily before breakfast.   FERROUS SULFATE 325 (65 FE) MG TABLET    Take 325 mg by mouth daily with breakfast.   HYDROCODONE-ACETAMINOPHEN (NORCO/VICODIN) 5-325 MG TABLET    Take 1-2 tablets by mouth every 4 (four) hours as needed (breakthrough pain).   LOSARTAN-HYDROCHLOROTHIAZIDE (HYZAAR) 100-12.5 MG TABLET    Take 1 tablet by mouth daily.   METHOCARBAMOL (ROBAXIN) 500 MG TABLET    Take 1  tablet (500 mg total) by mouth every 6 (six) hours as needed for muscle spasms.   METOPROLOL TARTRATE (LOPRESSOR) 25 MG TABLET    Take 25 mg by mouth 2 (two) times daily.   MULTIPLE VITAMIN (MULITIVITAMIN WITH MINERALS) TABS    Take 1 tablet by mouth daily.   NITROGLYCERIN (NITROSTAT) 0.4 MG SL TABLET    Place 0.4 mg under the tongue every 5 (five) minutes as needed for chest pain.    POTASSIUM CHLORIDE (K-DUR) 10 MEQ TABLET    Take 10 mEq by mouth daily.  Modified Medications   No medications on file  Discontinued Medications   No medications on file     SIGNIFICANT DIAGNOSTIC EXAMS  05-17-15: left hip and pelvis x-ray: Anatomic alignment post left total hip arthroplasty without acute complicating features.    LABS REVIEWED;   05-18-15: wbc 6.1 ;hgb 9.3; hct 27.9'; mcv 91.5 ;plt 201; glucose 124; bun 17; creat 1.07; k+ 4.1; na++139 05-19-15: wbc 6.4; hgb 9.4; hct 28.8; mcv 88.3 plt 2076 05-20-15; wbc 6.1; hgb 8.6; hct 24.7; mcv 87.9; plt 188    Review of Systems  Constitutional: Negative for malaise/fatigue.  Respiratory: Negative for cough and shortness of breath.   Cardiovascular: Negative for chest pain, palpitations and leg swelling.  Gastrointestinal: Negative for heartburn, abdominal pain and constipation.  Musculoskeletal: Negative for myalgias, back pain and joint pain.       Is status post left hip replacement   Skin: Negative.   Neurological: Negative for dizziness.  Psychiatric/Behavioral: The patient is not nervous/anxious.      Physical Exam  Constitutional: She is oriented to person, place, and time. She appears well-developed and well-nourished. No distress.  Overweight   Eyes: Conjunctivae are normal.  Neck: Neck supple. No JVD present. No thyromegaly present.  Cardiovascular: Normal rate, regular rhythm and intact distal pulses.   Respiratory: Effort normal and breath sounds normal. No respiratory distress. She has no wheezes.  GI: Soft. Bowel sounds are normal.  She exhibits no distension. There is no tenderness.  Musculoskeletal: She exhibits no edema.  Able to move all extremities  Is status post left hip replacement   Lymphadenopathy:    She has no cervical adenopathy.  Neurological: She is alert and oriented to person, place, and time.  Skin: Skin is warm and dry. She is not diaphoretic.  Left hip without signs of infection/inflammation present   Psychiatric: She has a normal mood and affect.      ASSESSMENT/ PLAN:  1. Hypertension: will continue hyzaar 100/12.5 mg daily lopressor 25 mg twice daily will continue asa 325 mg daily and has ntg prn as needed for chest pain  2. Genella Rife: is status post GI bleed will continue nexium 40 mg daily   3. Hypokalemia: will continue k+ 10 meq daily   4. Osteoarthritis left hip: is status post left hip replacement: will continue therapy as directed and will follow up with orthopedics. Will continue robaxin 500 mg every 6 hours as needed and vicodin 5/325 mg 1 or 2 tabs every four hours as needed for pain   5. Acute blood loss anemia: hgb is 8.6 will continue iron daily will repeat cbc     Time spent with patient  50   minutes >50% time spent counseling; reviewing medical record; tests; labs; and developing future plan of care       Synthia Innocent NP [facility]  Contact [phone] Monday through Friday 8am- 5pm  After hours call [phone]

## 2015-05-24 ENCOUNTER — Non-Acute Institutional Stay (SKILLED_NURSING_FACILITY): Payer: Medicare Other | Admitting: Internal Medicine

## 2015-05-24 ENCOUNTER — Encounter: Payer: Self-pay | Admitting: Internal Medicine

## 2015-05-24 DIAGNOSIS — K219 Gastro-esophageal reflux disease without esophagitis: Secondary | ICD-10-CM

## 2015-05-24 DIAGNOSIS — E785 Hyperlipidemia, unspecified: Secondary | ICD-10-CM | POA: Diagnosis not present

## 2015-05-24 DIAGNOSIS — Z96642 Presence of left artificial hip joint: Secondary | ICD-10-CM

## 2015-05-24 DIAGNOSIS — I10 Essential (primary) hypertension: Secondary | ICD-10-CM | POA: Diagnosis not present

## 2015-05-24 DIAGNOSIS — I350 Nonrheumatic aortic (valve) stenosis: Secondary | ICD-10-CM | POA: Insufficient documentation

## 2015-05-24 DIAGNOSIS — D62 Acute posthemorrhagic anemia: Secondary | ICD-10-CM

## 2015-05-24 DIAGNOSIS — Z8719 Personal history of other diseases of the digestive system: Secondary | ICD-10-CM

## 2015-05-24 DIAGNOSIS — Z96649 Presence of unspecified artificial hip joint: Secondary | ICD-10-CM

## 2015-05-24 NOTE — Progress Notes (Signed)
Patient ID: [name], female   DOB: 17-Jan-1924, 82 y.o.   MRN: [id number]    HISTORY AND PHYSICAL   DATE: 05/24/15  Location:  [facility]    Place of Service: SNF [phone])   Extended Emergency Contact Information Primary Emergency Contact: Ransom,Carolyn          [place] [address] [place] Phone: [phone] Relation: Daughter  Advanced Directive information    Chief Complaint  Patient presents with  . New Admit To SNF    HPI:  82 yo female seen today as a new admission into SNF following hospital stay for left hip OA, HTN, hyperlipidemia, anemia with hx GIB 2/2 diverticular source, GERD, arthritis, nocturia, AS. She underwent left THR on 05/17/15. She rec'd perioperative abx Ancef. No immdte post op complications. hgb dropped to 7 and she was symptomatic and thus rec'd 2 units PRBC. hgb at d/c 9.3. She is on ASA for DVT prophylaxis. She has robaxin and vicodin for pain. She presents for short term rehab  today she c/o left thigh swelling. Pain controlled. No falls. No nursing issues. No falls.  Hypertension/AS - stable on hyzaar 100/12.5 mg daily; lopressor 25 mg twice daily; asa 325 mg daily; ntg prn as needed for chest pain. Followed by cardiology  GERD -  Stable on nexium 40 mg daily   Hypokalemia -  will continue k+ 10 meq daily   Anemia - takes iron supplements  Past Medical History  Diagnosis Date  . Diverticulitis   . History of GI diverticular bleed   . High cholesterol     can't take the meds  . HTN (hypertension)     takes Losartan-HCTZ and Metoprolol daily  . Anemia     takes Ferrous Sulfate daily  . Acid reflux     takes Nexium daily  . Shortness of breath dyspnea     occasionally and with exertion  . Numbness     in legs  . Arthritis   . Joint pain   . Joint swelling   . Peripheral edema     occasionally  . Slow urinary stream     at times  . Nocturia   . Hematochezia 03/2015  . History of blood  transfusion 03/2015    no abnormal reaction noted  . Aortic stenosis     mild AS pk grad 18, mean grad 9, AVA 1.32 cm 03/2015 echo (Dr. Einar Gip)    Past Surgical History  Procedure Laterality Date  . Abcess drainage      abdominal abcess  . Cholecystectomy    . Dilation and curettage of uterus    . Esophagogastroduodenoscopy N/A 02/23/2015    Procedure: ESOPHAGOGASTRODUODENOSCOPY (EGD);  Surgeon: Laurence Spates, MD;  Location: [facility] ENDOSCOPY;  Service: Endoscopy;  Laterality: N/A;  . Flexible sigmoidoscopy N/A 02/24/2015    Procedure: FLEXIBLE SIGMOIDOSCOPY;  Surgeon: Laurence Spates, MD;  Location: [facility];  Service: Endoscopy;  Laterality: N/A;  . Cataract surgery Bilateral   . Left knee surgery    . Total hip arthroplasty Left 05/17/2015    Procedure: LEFT TOTAL HIP ARTHROPLASTY ANTERIOR APPROACH;  Surgeon: Mcarthur Rossetti, MD;  Location: [place];  Service: Orthopedics;  Laterality: Left;    Patient Care Team: Nolene Ebbs, MD as PCP - General (Internal Medicine)  Social History   Social History  . Marital Status: Widowed    Spouse Name: N/A  . Number of Children: N/A  . Years of Education: N/A  Occupational History  . Not on file.   Social History Main Topics  . Smoking status: Former Research scientist (life sciences)  . Smokeless tobacco: Never Used     Comment: quit smoking 8+ yrs ago  . Alcohol Use: No     Comment: quit yrs ago  . Drug Use: No  . Sexual Activity: Not Currently   Other Topics Concern  . Not on file   Social History Narrative     reports that she has quit smoking. She has never used smokeless tobacco. She reports that she does not drink alcohol or use illicit drugs.  Family History  Problem Relation Age of Onset  . Diabetes Other   . Hypertension Other   . Cancer Other   . Heart disease Other    No family status information on file.    Immunization History  Administered Date(s) Administered  . Influenza,inj,Quad PF,36+ Mos 11/14/2012  .  Influenza-Unspecified 11/12/2013  . PPD Test 05/20/2015    Allergies  Allergen Reactions  . Ezetimibe Anaphylaxis  . Lipitor [Atorvastatin] Swelling  . Pravachol [Pravastatin] Swelling  . Statins Swelling and Other (See Comments)    Swelling of mouth and lips  . Zocor [Simvastatin] Swelling  . Cholestyramine Nausea And Vomiting    Medications: Patient's Medications  New Prescriptions   No medications on file  Previous Medications   ACETAMINOPHEN (TYLENOL) 325 MG TABLET    Take 650 mg by mouth every 6 (six) hours as needed. pain   ASPIRIN EC 325 MG EC TABLET    Take 1 tablet (325 mg total) by mouth daily.   CHOLECALCIFEROL (VITAMIN D3) 2000 UNITS TABS    Take 2,000 Units by mouth daily.   ESOMEPRAZOLE (NEXIUM) 40 MG CAPSULE    Take 40 mg by mouth daily before breakfast.   FERROUS SULFATE 325 (65 FE) MG TABLET    Take 325 mg by mouth daily with breakfast.   HYDROCODONE-ACETAMINOPHEN (NORCO/VICODIN) 5-325 MG TABLET    Take 1-2 tablets by mouth every 4 (four) hours as needed (breakthrough pain).   LOSARTAN-HYDROCHLOROTHIAZIDE (HYZAAR) 100-12.5 MG TABLET    Take 1 tablet by mouth daily.   METHOCARBAMOL (ROBAXIN) 500 MG TABLET    Take 1 tablet (500 mg total) by mouth every 6 (six) hours as needed for muscle spasms.   METOPROLOL TARTRATE (LOPRESSOR) 25 MG TABLET    Take 25 mg by mouth 2 (two) times daily.   MULTIPLE VITAMIN (MULITIVITAMIN WITH MINERALS) TABS    Take 1 tablet by mouth daily.   NITROGLYCERIN (NITROSTAT) 0.4 MG SL TABLET    Place 0.4 mg under the tongue every 5 (five) minutes as needed for chest pain.    POTASSIUM CHLORIDE (K-DUR) 10 MEQ TABLET    Take 10 mEq by mouth daily.  Modified Medications   No medications on file  Discontinued Medications   No medications on file    Review of Systems  Respiratory: Negative for chest tightness.   Cardiovascular: Positive for leg swelling.  Musculoskeletal: Positive for arthralgias and gait problem.  All other systems reviewed  and are negative.   Filed Vitals:   05/24/15 1336  BP: 130/80  Pulse: 72  Temp: 98.3 F (36.8 C)  TempSrc: Oral  Resp: 20  Height: '5\' 4"'  (1.626 m)  Weight: 210 lb 3.2 oz (95.346 kg)   Body mass index is 36.06 kg/(m^2).  Physical Exam  Constitutional: She is oriented to person, place, and time. She appears well-developed and well-nourished.  Sitting in w/c in  NAD  HENT:  Mouth/Throat: Oropharynx is clear and moist. No oropharyngeal exudate.  Eyes: Pupils are equal, round, and reactive to light. No scleral icterus.  Neck: Neck supple. Carotid bruit is present (systolic). No tracheal deviation present. Thyromegaly present.  Cardiovascular: Normal rate, regular rhythm and intact distal pulses.  Exam reveals no gallop and no friction rub.   Murmur (2/6 SEM) heard. L>R +1 pitting LE edema  Pulmonary/Chest: Effort normal and breath sounds normal. No stridor. No respiratory distress. She has no wheezes. She has no rales.  Abdominal: Soft. Bowel sounds are normal. She exhibits no distension and no mass. There is no hepatomegaly. There is no tenderness. There is no rebound and no guarding.  Musculoskeletal: She exhibits edema (min swelling lateral thigh-->foot).  lateral thigh dsg intact but dried blood noted  Lymphadenopathy:    She has no cervical adenopathy.  Neurological: She is alert and oriented to person, place, and time. She has normal reflexes.  Skin: Skin is warm and dry. No rash noted.  Psychiatric: She has a normal mood and affect. Her behavior is normal. Judgment and thought content normal.     Labs reviewed: Admission on 05/17/2015, Discharged on 05/20/2015  Component Date Value Ref Range Status  . WBC 05/18/2015 6.1  4.0 - 10.5 K/uL Final  . RBC 05/18/2015 3.05* 3.87 - 5.11 MIL/uL Final  . Hemoglobin 05/18/2015 9.3* 12.0 - 15.0 g/dL Final  . HCT 05/18/2015 27.9* 36.0 - 46.0 % Final  . MCV 05/18/2015 91.5  78.0 - 100.0 fL Final  . MCH 05/18/2015 30.5  26.0 - 34.0 pg  Final  . MCHC 05/18/2015 33.3  30.0 - 36.0 g/dL Final  . RDW 05/18/2015 14.5  11.5 - 15.5 % Final  . Platelets 05/18/2015 201  150 - 400 K/uL Final  . Sodium 05/18/2015 139  135 - 145 mmol/L Final  . Potassium 05/18/2015 4.1  3.5 - 5.1 mmol/L Final  . Chloride 05/18/2015 101  101 - 111 mmol/L Final  . CO2 05/18/2015 26  22 - 32 mmol/L Final  . Glucose, Bld 05/18/2015 124* 65 - 99 mg/dL Final  . BUN 05/18/2015 17  6 - 20 mg/dL Final  . Creatinine, Ser 05/18/2015 1.07* 0.44 - 1.00 mg/dL Final  . Calcium 05/18/2015 8.8* 8.9 - 10.3 mg/dL Final  . GFR calc non Af Amer 05/18/2015 45* >60 mL/min Final  . GFR calc Af Amer 05/18/2015 52* >60 mL/min Final   Comment: (NOTE) The eGFR has been calculated using the CKD EPI equation. This calculation has not been validated in all clinical situations. eGFR's persistently <60 mL/min signify possible Chronic Kidney Disease.   . Anion gap 05/18/2015 12  5 - 15 Final  . WBC 05/19/2015 5.1  4.0 - 10.5 K/uL Final  . RBC 05/19/2015 2.39* 3.87 - 5.11 MIL/uL Final  . Hemoglobin 05/19/2015 7.0* 12.0 - 15.0 g/dL Final   Comment: REPEATED TO VERIFY SPECIMEN CHECKED FOR CLOTS   . HCT 05/19/2015 21.5* 36.0 - 46.0 % Final  . MCV 05/19/2015 90.0  78.0 - 100.0 fL Final  . MCH 05/19/2015 29.3  26.0 - 34.0 pg Final  . MCHC 05/19/2015 32.6  30.0 - 36.0 g/dL Final  . RDW 05/19/2015 14.5  11.5 - 15.5 % Final  . Platelets 05/19/2015 198  150 - 400 K/uL Final  . ABO/RH(D) 05/19/2015 B POS   Final  . Antibody Screen 05/19/2015 NEG   Final  . Sample Expiration 05/19/2015 05/22/2015   Final  .  Unit Number 05/19/2015 [id number]   Final  . Blood Component Type 05/19/2015 RED CELLS,LR   Final  . Unit division 05/19/2015 00   Final  . Status of Unit 05/19/2015 ISSUED,FINAL   Final  . Transfusion Status 05/19/2015 OK TO TRANSFUSE   Final  . Crossmatch Result 05/19/2015 Compatible   Final  . Unit Number 05/19/2015 [id number]   Final  . Blood Component Type  05/19/2015 RBC LR PHER2   Final  . Unit division 05/19/2015 00   Final  . Status of Unit 05/19/2015 ISSUED,FINAL   Final  . Transfusion Status 05/19/2015 OK TO TRANSFUSE   Final  . Crossmatch Result 05/19/2015 Compatible   Final  . Order Confirmation 05/19/2015 ORDER PROCESSED BY BLOOD BANK   Final  . WBC 05/20/2015 6.1  4.0 - 10.5 K/uL Final  . RBC 05/20/2015 2.81* 3.87 - 5.11 MIL/uL Final  . Hemoglobin 05/20/2015 8.6* 12.0 - 15.0 g/dL Final  . HCT 05/20/2015 24.7* 36.0 - 46.0 % Final  . MCV 05/20/2015 87.9  78.0 - 100.0 fL Final  . MCH 05/20/2015 30.6  26.0 - 34.0 pg Final  . MCHC 05/20/2015 34.8  30.0 - 36.0 g/dL Final  . RDW 05/20/2015 15.2  11.5 - 15.5 % Final  . Platelets 05/20/2015 188  150 - 400 K/uL Final  . WBC 05/19/2015 6.4  4.0 - 10.5 K/uL Final  . RBC 05/19/2015 3.26* 3.87 - 5.11 MIL/uL Final  . Hemoglobin 05/19/2015 9.4* 12.0 - 15.0 g/dL Final   Comment: REPEATED TO VERIFY POST TRANSFUSION SPECIMEN   . HCT 05/19/2015 28.8* 36.0 - 46.0 % Final  . MCV 05/19/2015 88.3  78.0 - 100.0 fL Final  . MCH 05/19/2015 28.8  26.0 - 34.0 pg Final  . MCHC 05/19/2015 32.6  30.0 - 36.0 g/dL Final  . RDW 05/19/2015 14.8  11.5 - 15.5 % Final  . Platelets 05/19/2015 207  150 - 400 K/uL Final  Hospital Outpatient Visit on 05/05/2015  Component Date Value Ref Range Status  . MRSA, PCR 05/05/2015 NEGATIVE  NEGATIVE Final  . Staphylococcus aureus 05/05/2015 NEGATIVE  NEGATIVE Final   Comment:        The Xpert SA Assay (FDA approved for NASAL specimens in patients over 82 years of age), is one component of a comprehensive surveillance program.  Test performance has been validated by [facility] for patients greater than or equal to 82 year old. It is not intended to diagnose infection nor to guide or monitor treatment.   . Sodium 05/05/2015 141  135 - 145 mmol/L Final  . Potassium 05/05/2015 5.3* 3.5 - 5.1 mmol/L Final  . Chloride 05/05/2015 107  101 - 111 mmol/L Final  . CO2  05/05/2015 25  22 - 32 mmol/L Final  . Glucose, Bld 05/05/2015 97  65 - 99 mg/dL Final  . BUN 05/05/2015 27* 6 - 20 mg/dL Final  . Creatinine, Ser 05/05/2015 1.56* 0.44 - 1.00 mg/dL Final  . Calcium 05/05/2015 9.6  8.9 - 10.3 mg/dL Final  . GFR calc non Af Amer 05/05/2015 28* >60 mL/min Final  . GFR calc Af Amer 05/05/2015 33* >60 mL/min Final   Comment: (NOTE) The eGFR has been calculated using the CKD EPI equation. This calculation has not been validated in all clinical situations. eGFR's persistently <60 mL/min signify possible Chronic Kidney Disease.   . Anion gap 05/05/2015 9  5 - 15 Final  . WBC 05/05/2015 5.6  4.0 - 10.5 K/uL Final  .  RBC 05/05/2015 3.61* 3.87 - 5.11 MIL/uL Final  . Hemoglobin 05/05/2015 10.5* 12.0 - 15.0 g/dL Final  . HCT 05/05/2015 33.2* 36.0 - 46.0 % Final  . MCV 05/05/2015 92.0  78.0 - 100.0 fL Final  . MCH 05/05/2015 29.1  26.0 - 34.0 pg Final  . MCHC 05/05/2015 31.6  30.0 - 36.0 g/dL Final  . RDW 05/05/2015 15.1  11.5 - 15.5 % Final  . Platelets 05/05/2015 301  150 - 400 K/uL Final  Admission on 02/21/2015, Discharged on 02/24/2015  Component Date Value Ref Range Status  . WBC 02/21/2015 3.6* 4.0 - 10.5 K/uL Final  . RBC 02/21/2015 3.00* 3.87 - 5.11 MIL/uL Final  . Hemoglobin 02/21/2015 9.0* 12.0 - 15.0 g/dL Final  . HCT 02/21/2015 27.8* 36.0 - 46.0 % Final  . MCV 02/21/2015 92.7  78.0 - 100.0 fL Final  . MCH 02/21/2015 30.0  26.0 - 34.0 pg Final  . MCHC 02/21/2015 32.4  30.0 - 36.0 g/dL Final  . RDW 02/21/2015 14.9  11.5 - 15.5 % Final  . Platelets 02/21/2015 296  150 - 400 K/uL Final  . Neutrophils Relative % 02/21/2015 62   Final  . Neutro Abs 02/21/2015 2.2  1.7 - 7.7 K/uL Final  . Lymphocytes Relative 02/21/2015 25   Final  . Lymphs Abs 02/21/2015 0.9  0.7 - 4.0 K/uL Final  . Monocytes Relative 02/21/2015 10   Final  . Monocytes Absolute 02/21/2015 0.4  0.1 - 1.0 K/uL Final  . Eosinophils Relative 02/21/2015 3   Final  . Eosinophils Absolute  02/21/2015 0.1  0.0 - 0.7 K/uL Final  . Basophils Relative 02/21/2015 0   Final  . Basophils Absolute 02/21/2015 0.0  0.0 - 0.1 K/uL Final  . Sodium 02/21/2015 141  135 - 145 mmol/L Final  . Potassium 02/21/2015 4.3  3.5 - 5.1 mmol/L Final  . Chloride 02/21/2015 104  101 - 111 mmol/L Final  . CO2 02/21/2015 26  22 - 32 mmol/L Final  . Glucose, Bld 02/21/2015 128* 65 - 99 mg/dL Final  . BUN 02/21/2015 17  6 - 20 mg/dL Final  . Creatinine, Ser 02/21/2015 1.40* 0.44 - 1.00 mg/dL Final  . Calcium 02/21/2015 9.4  8.9 - 10.3 mg/dL Final  . Total Protein 02/21/2015 6.1* 6.5 - 8.1 g/dL Final  . Albumin 02/21/2015 3.2* 3.5 - 5.0 g/dL Final  . AST 02/21/2015 20  15 - 41 U/L Final  . ALT 02/21/2015 11* 14 - 54 U/L Final  . Alkaline Phosphatase 02/21/2015 61  38 - 126 U/L Final  . Total Bilirubin 02/21/2015 0.3  0.3 - 1.2 mg/dL Final  . GFR calc non Af Amer 02/21/2015 32* >60 mL/min Final  . GFR calc Af Amer 02/21/2015 37* >60 mL/min Final   Comment: (NOTE) The eGFR has been calculated using the CKD EPI equation. This calculation has not been validated in all clinical situations. eGFR's persistently <60 mL/min signify possible Chronic Kidney Disease.   . Anion gap 02/21/2015 11  5 - 15 Final  . Prothrombin Time 02/21/2015 15.2  11.6 - 15.2 seconds Final  . INR 02/21/2015 1.18  0.00 - 1.49 Final  . ABO/RH(D) 02/21/2015 B POS   Final  . Antibody Screen 02/21/2015 NEG   Final  . Sample Expiration 02/21/2015 02/24/2015   Final  . Unit Number 02/21/2015 [id number]   Final  . Blood Component Type 02/21/2015 RED CELLS,LR   Final  . Unit division 02/21/2015 00   Final  .  Status of Unit 02/21/2015 ISSUED,FINAL   Final  . Transfusion Status 02/21/2015 OK TO TRANSFUSE   Final  . Crossmatch Result 02/21/2015 Compatible   Final  . Fecal Occult Bld 02/21/2015 POSITIVE* NEGATIVE Final  . WBC 02/21/2015 3.3* 4.0 - 10.5 K/uL Final  . RBC 02/21/2015 2.88* 3.87 - 5.11 MIL/uL Final  . Hemoglobin  02/21/2015 8.6* 12.0 - 15.0 g/dL Final  . HCT 02/21/2015 26.6* 36.0 - 46.0 % Final  . MCV 02/21/2015 92.4  78.0 - 100.0 fL Final  . MCH 02/21/2015 29.9  26.0 - 34.0 pg Final  . MCHC 02/21/2015 32.3  30.0 - 36.0 g/dL Final  . RDW 02/21/2015 14.9  11.5 - 15.5 % Final  . Platelets 02/21/2015 289  150 - 400 K/uL Final  . Sodium 02/21/2015 141  135 - 145 mmol/L Final  . Potassium 02/21/2015 3.8  3.5 - 5.1 mmol/L Final  . Chloride 02/21/2015 108  101 - 111 mmol/L Final  . CO2 02/21/2015 25  22 - 32 mmol/L Final  . Glucose, Bld 02/21/2015 88  65 - 99 mg/dL Final  . BUN 02/21/2015 14  6 - 20 mg/dL Final  . Creatinine, Ser 02/21/2015 1.19* 0.44 - 1.00 mg/dL Final  . Calcium 02/21/2015 9.1  8.9 - 10.3 mg/dL Final  . GFR calc non Af Amer 02/21/2015 39* >60 mL/min Final  . GFR calc Af Amer 02/21/2015 46* >60 mL/min Final   Comment: (NOTE) The eGFR has been calculated using the CKD EPI equation. This calculation has not been validated in all clinical situations. eGFR's persistently <60 mL/min signify possible Chronic Kidney Disease.   . Anion gap 02/21/2015 8  5 - 15 Final  . WBC 02/22/2015 4.2  4.0 - 10.5 K/uL Final  . RBC 02/22/2015 2.57* 3.87 - 5.11 MIL/uL Final  . Hemoglobin 02/22/2015 7.7* 12.0 - 15.0 g/dL Final  . HCT 02/22/2015 24.1* 36.0 - 46.0 % Final  . MCV 02/22/2015 93.8  78.0 - 100.0 fL Final  . MCH 02/22/2015 30.0  26.0 - 34.0 pg Final  . MCHC 02/22/2015 32.0  30.0 - 36.0 g/dL Final  . RDW 02/22/2015 15.1  11.5 - 15.5 % Final  . Platelets 02/22/2015 266  150 - 400 K/uL Final  . Sodium 02/22/2015 142  135 - 145 mmol/L Final  . Potassium 02/22/2015 4.0  3.5 - 5.1 mmol/L Final  . Chloride 02/22/2015 108  101 - 111 mmol/L Final  . CO2 02/22/2015 27  22 - 32 mmol/L Final  . Glucose, Bld 02/22/2015 109* 65 - 99 mg/dL Final  . BUN 02/22/2015 18  6 - 20 mg/dL Final  . Creatinine, Ser 02/22/2015 1.48* 0.44 - 1.00 mg/dL Final  . Calcium 02/22/2015 8.3* 8.9 - 10.3 mg/dL Final  . Total  Protein 02/22/2015 4.8* 6.5 - 8.1 g/dL Final  . Albumin 02/22/2015 2.7* 3.5 - 5.0 g/dL Final  . AST 02/22/2015 19  15 - 41 U/L Final  . ALT 02/22/2015 12* 14 - 54 U/L Final  . Alkaline Phosphatase 02/22/2015 49  38 - 126 U/L Final  . Total Bilirubin 02/22/2015 0.3  0.3 - 1.2 mg/dL Final  . GFR calc non Af Amer 02/22/2015 30* >60 mL/min Final  . GFR calc Af Amer 02/22/2015 35* >60 mL/min Final   Comment: (NOTE) The eGFR has been calculated using the CKD EPI equation. This calculation has not been validated in all clinical situations. eGFR's persistently <60 mL/min signify possible Chronic Kidney Disease.   Georgiann Hahn  gap 02/22/2015 7  5 - 15 Final  . Order Confirmation 02/22/2015 ORDER PROCESSED BY BLOOD BANK   Final  . Sodium 02/23/2015 139  135 - 145 mmol/L Final  . Potassium 02/23/2015 4.2  3.5 - 5.1 mmol/L Final  . Chloride 02/23/2015 107  101 - 111 mmol/L Final  . CO2 02/23/2015 23  22 - 32 mmol/L Final  . Glucose, Bld 02/23/2015 88  65 - 99 mg/dL Final  . BUN 02/23/2015 14  6 - 20 mg/dL Final  . Creatinine, Ser 02/23/2015 1.19* 0.44 - 1.00 mg/dL Final  . Calcium 02/23/2015 8.6* 8.9 - 10.3 mg/dL Final  . GFR calc non Af Amer 02/23/2015 39* >60 mL/min Final  . GFR calc Af Amer 02/23/2015 46* >60 mL/min Final   Comment: (NOTE) The eGFR has been calculated using the CKD EPI equation. This calculation has not been validated in all clinical situations. eGFR's persistently <60 mL/min signify possible Chronic Kidney Disease.   . Anion gap 02/23/2015 9  5 - 15 Final  . WBC 02/23/2015 4.1  4.0 - 10.5 K/uL Final  . RBC 02/23/2015 3.10* 3.87 - 5.11 MIL/uL Final  . Hemoglobin 02/23/2015 8.9* 12.0 - 15.0 g/dL Final  . HCT 02/23/2015 27.6* 36.0 - 46.0 % Final  . MCV 02/23/2015 89.0  78.0 - 100.0 fL Final  . MCH 02/23/2015 28.7  26.0 - 34.0 pg Final  . MCHC 02/23/2015 32.2  30.0 - 36.0 g/dL Final  . RDW 02/23/2015 18.2* 11.5 - 15.5 % Final  . Platelets 02/23/2015 282  150 - 400 K/uL Final    . WBC 02/24/2015 5.0  4.0 - 10.5 K/uL Final  . RBC 02/24/2015 3.19* 3.87 - 5.11 MIL/uL Final  . Hemoglobin 02/24/2015 9.0* 12.0 - 15.0 g/dL Final  . HCT 02/24/2015 28.3* 36.0 - 46.0 % Final  . MCV 02/24/2015 88.7  78.0 - 100.0 fL Final  . MCH 02/24/2015 28.2  26.0 - 34.0 pg Final  . MCHC 02/24/2015 31.8  30.0 - 36.0 g/dL Final  . RDW 02/24/2015 17.3* 11.5 - 15.5 % Final  . Platelets 02/24/2015 297  150 - 400 K/uL Final    Dg Hip Port Unilat With Pelvis 1v Left  05/17/2015  CLINICAL DATA:  Immediate postop (postop day 0) left total hip arthroplasty, anterior approach. EXAM: DG HIP (WITH OR WITHOUT PELVIS) 1V PORT LEFT 1731 hr: COMPARISON:  Intraoperative left hip x-rays earlier today 1648 hr. FINDINGS: Anatomic alignment post left total hip arthroplasty. No acute complicating features. Gas in the overlying soft tissues as expected. No visible intra-articular gas. IMPRESSION: Anatomic alignment post left total hip arthroplasty without acute complicating features. Electronically Signed   By: Evangeline Dakin M.D.   On: 05/17/2015 17:53   Dg Hip Operative Unilat W Or W/o Pelvis Left  05/17/2015  CLINICAL DATA:  Left total hip arthroplasty anterior approach EXAM: OPERATIVE LEFT HIP (WITH PELVIS IF PERFORMED) 2 VIEWS TECHNIQUE: Fluoroscopic spot image(s) were submitted for interpretation post-operatively. COMPARISON:  None. FINDINGS: Fluoroscopy Time:  0 minutes 12 seconds Number of Acquired Images:  2 Components of total hip replacement appear in anticipated position. IMPRESSION: Fluoroscopic confirmation of total hip replacement components Electronically Signed   By: Skipper Cliche M.D.   On: 05/17/2015 17:02     Assessment/Plan   ICD-9-CM ICD-10-CM   1. Status post total replacement of left hip V43.64 Z96.649   2. GERD without esophagitis 530.81 K21.9   3. Essential hypertension 401.9 I10   4. Postoperative anemia  due to acute blood loss 285.1 D62   5. History of GI diverticular bleed V12.79  Z87.19   6. Hyperlipidemia 272.4 E78.5   7. Aortic stenosis 424.1 I35.0     Cont current meds as ordered  Follow CBC  PT/OT as ordered  F/u with Ortho as scheduled  GOAL: short term rehab and d/c home when medically appropriate. Communicated with pt and nursing.  Will follow  Meris Reede S. Perlie Gold  [facility] [address] [place], [place] [address] [phone] Cell (Monday-Friday 8 AM - 5 PM) [phone] After 5 PM and follow prompts

## 2015-05-31 ENCOUNTER — Non-Acute Institutional Stay (SKILLED_NURSING_FACILITY): Payer: Medicare Other | Admitting: Internal Medicine

## 2015-05-31 ENCOUNTER — Encounter: Payer: Self-pay | Admitting: Internal Medicine

## 2015-05-31 DIAGNOSIS — K219 Gastro-esophageal reflux disease without esophagitis: Secondary | ICD-10-CM

## 2015-05-31 DIAGNOSIS — E785 Hyperlipidemia, unspecified: Secondary | ICD-10-CM

## 2015-05-31 DIAGNOSIS — D62 Acute posthemorrhagic anemia: Secondary | ICD-10-CM | POA: Diagnosis not present

## 2015-05-31 DIAGNOSIS — I350 Nonrheumatic aortic (valve) stenosis: Secondary | ICD-10-CM

## 2015-05-31 DIAGNOSIS — I10 Essential (primary) hypertension: Secondary | ICD-10-CM | POA: Diagnosis not present

## 2015-05-31 DIAGNOSIS — Z96642 Presence of left artificial hip joint: Secondary | ICD-10-CM

## 2015-05-31 DIAGNOSIS — Z96649 Presence of unspecified artificial hip joint: Secondary | ICD-10-CM | POA: Diagnosis not present

## 2015-05-31 NOTE — Progress Notes (Signed)
Patient ID: [name], female   DOB: 1924/02/07, 82 y.o.   MRN: [id number]    DATE: 05/31/15  Location:  [facility]    Place of Service: SNF [phone])   Extended Emergency Contact Information Primary Emergency Contact: [place] [address] [place] Phone: [phone] Relation: Daughter  Advanced Directive information    Chief Complaint  Patient presents with  . Discharge Note    HPI:   82 yo female seen today for d/c from SNF after completing short term rehab. She has tolerated PT/OT. No nursing issues. No falls. SHE REPORTS FEELING WELL. SHE HAD STAPLES REMOVED AT ORTHO OFFICE THIS AM. NEXT F/U APPT IN 1 MONTH. She will be going home with [facility] PT/OT and nursing. No DME req'd  BRIEF SUMMARY OF HOSPITAL STAY: She was admitted to hospital for eft hip OA, HTN, hyperlipidemia, anemia with hx GIB 2/2 diverticular source, GERD, arthritis, nocturia, AS. She underwent left THR on 05/17/15. She rec'd perioperative abx Ancef. No immdte post op complications. hgb dropped to 7 and she was symptomatic and thus rec'd 2 units PRBC. hgb at d/c 9.3. She is on ASA for DVT prophylaxis. She has robaxin and vicodin for pain.   Hypertension/AS - stable overall on hyzaar 100/12.5 mg daily; lopressor 25 mg twice daily; asa 325 mg daily; ntg prn as needed for chest pain. Followed by cardiology  GERD -  Stable on nexium 40 mg daily   Hypokalemia -  will continue k+ 10 meq daily   Anemia - takes iron supplements  Past Medical History  Diagnosis Date  . Diverticulitis   . History of GI diverticular bleed   . High cholesterol     can't take the meds  . HTN (hypertension)     takes Losartan-HCTZ and Metoprolol daily  . Anemia     takes Ferrous Sulfate daily  . Acid reflux     takes Nexium daily  . Shortness of breath dyspnea     occasionally and with exertion  . Numbness     in legs  . Arthritis   . Joint pain   . Joint swelling   .  Peripheral edema     occasionally  . Slow urinary stream     at times  . Nocturia   . Hematochezia 03/2015  . History of blood transfusion 03/2015    no abnormal reaction noted  . Aortic stenosis     mild AS pk grad 18, mean grad 9, AVA 1.32 cm 03/2015 echo (Dr. Einar Gip)    Past Surgical History  Procedure Laterality Date  . Abcess drainage      abdominal abcess  . Cholecystectomy    . Dilation and curettage of uterus    . Esophagogastroduodenoscopy N/A 02/23/2015    Procedure: ESOPHAGOGASTRODUODENOSCOPY (EGD);  Surgeon: Laurence Spates, MD;  [facility]: [facility] ENDOSCOPY;  Service: Endoscopy;  Laterality: N/A;  . Flexible sigmoidoscopy N/A 02/24/2015    Procedure: FLEXIBLE SIGMOIDOSCOPY;  Surgeon: Laurence Spates, MD;  Location: [place];  Service: Endoscopy;  Laterality: N/A;  . Cataract surgery Bilateral   . Left knee surgery    . Total hip arthroplasty Left 05/17/2015    Procedure: LEFT TOTAL HIP ARTHROPLASTY ANTERIOR APPROACH;  Surgeon: Mcarthur Rossetti, MD;  Location: [place];  Service: Orthopedics;  Laterality: Left;    Patient Care Team: Nolene Ebbs, MD as PCP - General (Internal Medicine)  Social History  Social History  . Marital Status: Widowed    Spouse Name: N/A  . Number of Children: N/A  . Years of Education: N/A   Occupational History  . Not on file.   Social History Main Topics  . Smoking status: Former Research scientist (life sciences)  . Smokeless tobacco: Never Used     Comment: quit smoking 8+ yrs ago  . Alcohol Use: No     Comment: quit yrs ago  . Drug Use: No  . Sexual Activity: Not Currently   Other Topics Concern  . Not on file   Social History Narrative     reports that she has quit smoking. She has never used smokeless tobacco. She reports that she does not drink alcohol or use illicit drugs.  Immunization History  Administered Date(s) Administered  . Influenza,inj,Quad PF,36+ Mos 11/14/2012  . Influenza-Unspecified 11/12/2013  . PPD Test 05/20/2015     Allergies  Allergen Reactions  . Ezetimibe Anaphylaxis  . Lipitor [Atorvastatin] Swelling  . Pravachol [Pravastatin] Swelling  . Statins Swelling and Other (See Comments)    Swelling of mouth and lips  . Zocor [Simvastatin] Swelling  . Cholestyramine Nausea And Vomiting    Medications: Patient's Medications  New Prescriptions   No medications on file  Previous Medications   ACETAMINOPHEN (TYLENOL) 325 MG TABLET    Take 650 mg by mouth every 6 (six) hours as needed. pain   ASPIRIN EC 325 MG EC TABLET    Take 1 tablet (325 mg total) by mouth daily.   CHOLECALCIFEROL (VITAMIN D3) 2000 UNITS TABS    Take 2,000 Units by mouth daily.   ESOMEPRAZOLE (NEXIUM) 40 MG CAPSULE    Take 40 mg by mouth daily before breakfast.   FERROUS SULFATE 325 (65 FE) MG TABLET    Take 325 mg by mouth daily with breakfast.   HYDROCODONE-ACETAMINOPHEN (NORCO/VICODIN) 5-325 MG TABLET    Take 1-2 tablets by mouth every 4 (four) hours as needed (breakthrough pain).   LOSARTAN-HYDROCHLOROTHIAZIDE (HYZAAR) 100-12.5 MG TABLET    Take 1 tablet by mouth daily.   METHOCARBAMOL (ROBAXIN) 500 MG TABLET    Take 1 tablet (500 mg total) by mouth every 6 (six) hours as needed for muscle spasms.   METOPROLOL TARTRATE (LOPRESSOR) 25 MG TABLET    Take 25 mg by mouth 2 (two) times daily.   MULTIPLE VITAMIN (MULITIVITAMIN WITH MINERALS) TABS    Take 1 tablet by mouth daily.   NITROGLYCERIN (NITROSTAT) 0.4 MG SL TABLET    Place 0.4 mg under the tongue every 5 (five) minutes as needed for chest pain.    POTASSIUM CHLORIDE (K-DUR) 10 MEQ TABLET    Take 10 mEq by mouth daily.  Modified Medications   No medications on file  Discontinued Medications   No medications on file    Review of Systems  Respiratory: Negative for chest tightness.   Cardiovascular: Positive for leg swelling.  Musculoskeletal: Positive for arthralgias and gait problem.  All other systems reviewed and are negative.   Filed Vitals:   05/31/15 1411   BP: 158/72  Pulse: 72  Temp: 97.7 F (36.5 C)  Weight: 213 lb (96.616 kg)  SpO2: 97%   Body mass index is 36.54 kg/(m^2).  Physical Exam  Constitutional: She is oriented to person, place, and time. She appears well-developed and well-nourished. No distress.  Musculoskeletal: She exhibits edema.  Neurological: She is alert and oriented to person, place, and time.  Skin: Skin is warm and dry. No rash noted.  Psychiatric: She has a normal mood and affect. Her behavior is normal. Judgment and thought content normal.     Labs reviewed: Admission on 05/17/2015, Discharged on 05/20/2015  Component Date Value Ref Range Status  . WBC 05/18/2015 6.1  4.0 - 10.5 K/uL Final  . RBC 05/18/2015 3.05* 3.87 - 5.11 MIL/uL Final  . Hemoglobin 05/18/2015 9.3* 12.0 - 15.0 g/dL Final  . HCT 05/18/2015 27.9* 36.0 - 46.0 % Final  . MCV 05/18/2015 91.5  78.0 - 100.0 fL Final  . MCH 05/18/2015 30.5  26.0 - 34.0 pg Final  . MCHC 05/18/2015 33.3  30.0 - 36.0 g/dL Final  . RDW 05/18/2015 14.5  11.5 - 15.5 % Final  . Platelets 05/18/2015 201  150 - 400 K/uL Final  . Sodium 05/18/2015 139  135 - 145 mmol/L Final  . Potassium 05/18/2015 4.1  3.5 - 5.1 mmol/L Final  . Chloride 05/18/2015 101  101 - 111 mmol/L Final  . CO2 05/18/2015 26  22 - 32 mmol/L Final  . Glucose, Bld 05/18/2015 124* 65 - 99 mg/dL Final  . BUN 05/18/2015 17  6 - 20 mg/dL Final  . Creatinine, Ser 05/18/2015 1.07* 0.44 - 1.00 mg/dL Final  . Calcium 05/18/2015 8.8* 8.9 - 10.3 mg/dL Final  . GFR calc non Af Amer 05/18/2015 45* >60 mL/min Final  . GFR calc Af Amer 05/18/2015 52* >60 mL/min Final   Comment: (NOTE) The eGFR has been calculated using the CKD EPI equation. This calculation has not been validated in all clinical situations. eGFR's persistently <60 mL/min signify possible Chronic Kidney Disease.   . Anion gap 05/18/2015 12  5 - 15 Final  . WBC 05/19/2015 5.1  4.0 - 10.5 K/uL Final  . RBC 05/19/2015 2.39* 3.87 - 5.11  MIL/uL Final  . Hemoglobin 05/19/2015 7.0* 12.0 - 15.0 g/dL Final   Comment: REPEATED TO VERIFY SPECIMEN CHECKED FOR CLOTS   . HCT 05/19/2015 21.5* 36.0 - 46.0 % Final  . MCV 05/19/2015 90.0  78.0 - 100.0 fL Final  . MCH 05/19/2015 29.3  26.0 - 34.0 pg Final  . MCHC 05/19/2015 32.6  30.0 - 36.0 g/dL Final  . RDW 05/19/2015 14.5  11.5 - 15.5 % Final  . Platelets 05/19/2015 198  150 - 400 K/uL Final  . ABO/RH(D) 05/19/2015 B POS   Final  . Antibody Screen 05/19/2015 NEG   Final  . Sample Expiration 05/19/2015 05/22/2015   Final  . Unit Number 05/19/2015 [id number]   Final  . Blood Component Type 05/19/2015 RED CELLS,LR   Final  . Unit division 05/19/2015 00   Final  . Status of Unit 05/19/2015 ISSUED,FINAL   Final  . Transfusion Status 05/19/2015 OK TO TRANSFUSE   Final  . Crossmatch Result 05/19/2015 Compatible   Final  . Unit Number 05/19/2015 [id number]   Final  . Blood Component Type 05/19/2015 RBC LR PHER2   Final  . Unit division 05/19/2015 00   Final  . Status of Unit 05/19/2015 ISSUED,FINAL   Final  . Transfusion Status 05/19/2015 OK TO TRANSFUSE   Final  . Crossmatch Result 05/19/2015 Compatible   Final  . Order Confirmation 05/19/2015 ORDER PROCESSED BY BLOOD BANK   Final  . WBC 05/20/2015 6.1  4.0 - 10.5 K/uL Final  . RBC 05/20/2015 2.81* 3.87 - 5.11 MIL/uL Final  . Hemoglobin 05/20/2015 8.6* 12.0 - 15.0 g/dL Final  . HCT 05/20/2015 24.7* 36.0 - 46.0 % Final  . MCV 05/20/2015 87.9  78.0 - 100.0 fL Final  . MCH 05/20/2015 30.6  26.0 - 34.0 pg Final  . MCHC 05/20/2015 34.8  30.0 - 36.0 g/dL Final  . RDW 05/20/2015 15.2  11.5 - 15.5 % Final  . Platelets 05/20/2015 188  150 - 400 K/uL Final  . WBC 05/19/2015 6.4  4.0 - 10.5 K/uL Final  . RBC 05/19/2015 3.26* 3.87 - 5.11 MIL/uL Final  . Hemoglobin 05/19/2015 9.4* 12.0 - 15.0 g/dL Final   Comment: REPEATED TO VERIFY POST TRANSFUSION SPECIMEN   . HCT 05/19/2015 28.8* 36.0 - 46.0 % Final  . MCV 05/19/2015 88.3  78.0  - 100.0 fL Final  . MCH 05/19/2015 28.8  26.0 - 34.0 pg Final  . MCHC 05/19/2015 32.6  30.0 - 36.0 g/dL Final  . RDW 05/19/2015 14.8  11.5 - 15.5 % Final  . Platelets 05/19/2015 207  150 - 400 K/uL Final  Hospital Outpatient Visit on 05/05/2015  Component Date Value Ref Range Status  . MRSA, PCR 05/05/2015 NEGATIVE  NEGATIVE Final  . Staphylococcus aureus 05/05/2015 NEGATIVE  NEGATIVE Final   Comment:        The Xpert SA Assay (FDA approved for NASAL specimens in patients over 49 years of age), is one component of a comprehensive surveillance program.  Test performance has been validated by [facility] for patients greater than or equal to 45 year old. It is not intended to diagnose infection nor to guide or monitor treatment.   . Sodium 05/05/2015 141  135 - 145 mmol/L Final  . Potassium 05/05/2015 5.3* 3.5 - 5.1 mmol/L Final  . Chloride 05/05/2015 107  101 - 111 mmol/L Final  . CO2 05/05/2015 25  22 - 32 mmol/L Final  . Glucose, Bld 05/05/2015 97  65 - 99 mg/dL Final  . BUN 05/05/2015 27* 6 - 20 mg/dL Final  . Creatinine, Ser 05/05/2015 1.56* 0.44 - 1.00 mg/dL Final  . Calcium 05/05/2015 9.6  8.9 - 10.3 mg/dL Final  . GFR calc non Af Amer 05/05/2015 28* >60 mL/min Final  . GFR calc Af Amer 05/05/2015 33* >60 mL/min Final   Comment: (NOTE) The eGFR has been calculated using the CKD EPI equation. This calculation has not been validated in all clinical situations. eGFR's persistently <60 mL/min signify possible Chronic Kidney Disease.   . Anion gap 05/05/2015 9  5 - 15 Final  . WBC 05/05/2015 5.6  4.0 - 10.5 K/uL Final  . RBC 05/05/2015 3.61* 3.87 - 5.11 MIL/uL Final  . Hemoglobin 05/05/2015 10.5* 12.0 - 15.0 g/dL Final  . HCT 05/05/2015 33.2* 36.0 - 46.0 % Final  . MCV 05/05/2015 92.0  78.0 - 100.0 fL Final  . MCH 05/05/2015 29.1  26.0 - 34.0 pg Final  . MCHC 05/05/2015 31.6  30.0 - 36.0 g/dL Final  . RDW 05/05/2015 15.1  11.5 - 15.5 % Final  . Platelets 05/05/2015 301   150 - 400 K/uL Final    Dg Hip Port Unilat With Pelvis 1v Left  05/17/2015  CLINICAL DATA:  Immediate postop (postop day 0) left total hip arthroplasty, anterior approach. EXAM: DG HIP (WITH OR WITHOUT PELVIS) 1V PORT LEFT 1731 hr: COMPARISON:  Intraoperative left hip x-rays earlier today 1648 hr. FINDINGS: Anatomic alignment post left total hip arthroplasty. No acute complicating features. Gas in the overlying soft tissues as expected. No visible intra-articular gas. IMPRESSION: Anatomic alignment post left total hip arthroplasty without acute complicating features. Electronically Signed   By: Evangeline Dakin  M.D.   On: 05/17/2015 17:53   Dg Hip Operative Unilat W Or W/o Pelvis Left  05/17/2015  CLINICAL DATA:  Left total hip arthroplasty anterior approach EXAM: OPERATIVE LEFT HIP (WITH PELVIS IF PERFORMED) 2 VIEWS TECHNIQUE: Fluoroscopic spot image(s) were submitted for interpretation post-operatively. COMPARISON:  None. FINDINGS: Fluoroscopy Time:  0 minutes 12 seconds Number of Acquired Images:  2 Components of total hip replacement appear in anticipated position. IMPRESSION: Fluoroscopic confirmation of total hip replacement components Electronically Signed   By: Skipper Cliche M.D.   On: 05/17/2015 17:02     Assessment/Plan   ICD-9-CM ICD-10-CM   1. Status post total replacement of left hip - due to OA V43.64 Z96.649   2. Essential hypertension 401.9 I10   3. Postoperative anemia due to acute blood loss 285.1 D62   4. Aortic stenosis 424.1 I35.0   5. Hyperlipidemia 272.4 E78.5   6. GERD without esophagitis 530.81 K21.9    Patient is being discharged with home health services:  Nursing, PT/OT  Patient is being discharged with the following durable medical equipment:  None   Patient has been advised to f/u with their PCP in 1-2 weeks to bring them up to date on their rehab stay.  They were provided with a 30 day supply of scripts for prescription medications and refills must be obtained  from their PCP.  TIME SPENT (MINUTES): Sasser. Perlie Gold  [facility] [address] [place], [place] [address] [phone] Cell (Monday-Friday 8 AM - 5 PM) [phone] After 5 PM and follow prompts

## 2016-01-02 ENCOUNTER — Ambulatory Visit (INDEPENDENT_AMBULATORY_CARE_PROVIDER_SITE_OTHER): Payer: Medicare Other

## 2016-01-02 ENCOUNTER — Ambulatory Visit (INDEPENDENT_AMBULATORY_CARE_PROVIDER_SITE_OTHER): Payer: Medicare Other | Admitting: Orthopaedic Surgery

## 2016-01-02 DIAGNOSIS — Z96642 Presence of left artificial hip joint: Secondary | ICD-10-CM | POA: Diagnosis not present

## 2016-01-02 NOTE — Progress Notes (Signed)
[name] is now 8 months status post a left total hip arthroplasty. She is 82 years old now. She says it hurts from time to time and she points the trochanteric areas source of her pain does radiate down occasionally to the IT bands were she points by the knee she denies any groin pains that she is doing well over all.  On exam she'll to put her hip on the left side through internal right rotation without any problems at all. She had does have pain to palpation of the trochanteric area.  At this point she will follow-up as needed which I agree with. Offered her injection trochanteric area but she does not want to have one today. She said she'll let me know if it worsens with come in to see us. She'll also follow up as needed.

## 2016-01-19 ENCOUNTER — Other Ambulatory Visit: Payer: Self-pay

## 2016-08-06 ENCOUNTER — Ambulatory Visit (INDEPENDENT_AMBULATORY_CARE_PROVIDER_SITE_OTHER): Payer: Self-pay

## 2016-08-06 ENCOUNTER — Ambulatory Visit (INDEPENDENT_AMBULATORY_CARE_PROVIDER_SITE_OTHER): Payer: Medicare Other

## 2016-08-06 ENCOUNTER — Ambulatory Visit (INDEPENDENT_AMBULATORY_CARE_PROVIDER_SITE_OTHER): Payer: Medicare Other | Admitting: Orthopaedic Surgery

## 2016-08-06 DIAGNOSIS — M25552 Pain in left hip: Secondary | ICD-10-CM

## 2016-08-06 MED ORDER — METHYLPREDNISOLONE 4 MG PO TABS: ORAL_TABLET | ORAL | 0 refills | Status: DC

## 2016-08-06 NOTE — Progress Notes (Signed)
Office Visit Note   Patient: [name]           Date of Birth: [birth year]           MRN: [id number] Visit Date: 08/06/2016              Requested by: Fleet Contras, MD [address] [place], [place] [address] PCP: Fleet Contras, MD   Assessment & Plan: Visit Diagnoses:  1. Pain in left hip     Plan: I do feel this is more of a trochanteric bursitis of her left hip. It does radiate down the IT band but it does not seem to back related either. I explained to her in detail her symptoms and signs and recommended at least a six-day steroid taper. She does have Voltaren gel order try this as well. She should still ambulate with a cane in her opposite hand. Another treatment option at this point would be to consider a trochanteric bursa injection at her left hip and I offered her this but she is deferred injection. We'll see if the steroid taper and time will help.  Follow-Up Instructions: Return if symptoms worsen or fail to improve.   Orders:  Orders Placed This Encounter  Procedures  . XR HIP UNILAT W OR W/O PELVIS 1V LEFT   Meds ordered this encounter  Medications  . methylPREDNISolone (MEDROL) 4 MG tablet    Sig: Medrol dose pack. Take as instructed    Dispense:  21 tablet    Refill:  0      Procedures: No procedures performed   Clinical Data: No additional findings.   Subjective: No chief complaint on file. The patient is a 83 year old female who is 15 months out from a left total hip arthroplasty. She says her left hip still gives her trouble when she points the trochanteric areas source of her pain. She does ambulate with a cane and she has bilateral ankle swelling as well. She denies any pain in her groin.  HPI  Review of Systems She denies any headache, chest pain, short of breath, fever, chills, nausea, vomiting.  Objective: Vital Signs: There were no vitals taken for this visit.  Physical Exam She is alert and oriented 3 and in no acute  distress Ortho Exam Examination of her left hip shows fluid internal/external rotation with no pain in the groin at all. Her incisions well-healed. She has bilateral ankle and foot swelling. She does have pain to palpation over just the trochanteric area of her left hip. Specialty Comments:  No specialty comments available.  Imaging: Xr Hip Unilat W Or W/o Pelvis 1v Left  Result Date: 08/06/2016 An AP pelvis and a lateral of her left hip show well-seated implant on the left side with no, getting features. There is otherwise no acute findings.    PMFS History: Patient Active Problem List   Diagnosis Date Noted  . Hyperlipidemia 05/24/2015  . Aortic stenosis 05/24/2015  . GERD without esophagitis 05/23/2015  . Postoperative anemia due to acute blood loss 05/23/2015  . Osteoarthritis of left hip 05/17/2015  . 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 GI bleed 02/21/2015  . Acute bronchitis 11/16/2012  . Hypertension 11/13/2012  . Bright red blood per rectum 11/13/2012  . Lower GI bleed 02/23/2012  . History of GI diverticular bleed 02/23/2012  . Diverticulosis 02/23/2012   Past Medical History:  Diagnosis Date  . Acid reflux    takes Nexium daily  . Anemia  takes Ferrous Sulfate daily  . Aortic stenosis    mild AS pk grad 18, mean grad 9, AVA 1.32 cm 03/2015 echo (Dr. Jacinto HalimGanji)  . Arthritis   . Diverticulitis   . Hematochezia 03/2015  . High cholesterol    can't take the meds  . History of blood transfusion 03/2015   no abnormal reaction noted  . History of GI diverticular bleed   . HTN (hypertension)    takes Losartan-HCTZ and Metoprolol daily  . Joint pain   . Joint swelling   . Nocturia   . Numbness    in legs  . Peripheral edema    occasionally  . Shortness of breath dyspnea    occasionally and with exertion  . Slow urinary stream    at times    Family History  Problem Relation Age of Onset  . Diabetes Other   . Hypertension Other   . Cancer  Other   . Heart disease Other     Past Surgical History:  Procedure Laterality Date  . ABCESS DRAINAGE     abdominal abcess  . cataract surgery Bilateral   . CHOLECYSTECTOMY    . DILATION AND CURETTAGE OF UTERUS    . ESOPHAGOGASTRODUODENOSCOPY N/A 02/23/2015   Procedure: ESOPHAGOGASTRODUODENOSCOPY (EGD);  Surgeon: Carman ChingJames Edwards, MD;  [facility];  Service: Endoscopy;  Laterality: N/A;  . FLEXIBLE SIGMOIDOSCOPY N/A 02/24/2015   Procedure: FLEXIBLE SIGMOIDOSCOPY;  Surgeon: Carman ChingJames Edwards, MD;  [facility];  Service: Endoscopy;  Laterality: N/A;  . left knee surgery    . TOTAL HIP ARTHROPLASTY Left 05/17/2015   Procedure: LEFT TOTAL HIP ARTHROPLASTY ANTERIOR APPROACH;  Surgeon: Kathryne Hitchhristopher Y Brentley Landfair, MD;  Location: [facility] OR;  Service: Orthopedics;  Laterality: Left;   Social History   Occupational History  . Not on file.   Social History Main Topics  . Smoking status: Former Games developermoker  . Smokeless tobacco: Never Used     Comment: quit smoking 8+ yrs ago  . Alcohol use No     Comment: quit yrs ago  . Drug use: No  . Sexual activity: Not Currently

## 2016-09-28 ENCOUNTER — Emergency Department (HOSPITAL_COMMUNITY)
Admission: EM | Admit: 2016-09-28 | Discharge: 2016-09-28 | Disposition: A | Discharge status: Home / Self Care | Payer: Medicare Other | Attending: Emergency Medicine | Admitting: Emergency Medicine

## 2016-09-28 DIAGNOSIS — Z7982 Long term (current) use of aspirin: Secondary | ICD-10-CM | POA: Diagnosis not present

## 2016-09-28 DIAGNOSIS — Z79899 Other long term (current) drug therapy: Secondary | ICD-10-CM | POA: Diagnosis not present

## 2016-09-28 DIAGNOSIS — Z87891 Personal history of nicotine dependence: Secondary | ICD-10-CM | POA: Insufficient documentation

## 2016-09-28 DIAGNOSIS — K625 Hemorrhage of anus and rectum: Secondary | ICD-10-CM | POA: Insufficient documentation

## 2016-09-28 DIAGNOSIS — I10 Essential (primary) hypertension: Secondary | ICD-10-CM | POA: Diagnosis not present

## 2016-09-28 LAB — POC OCCULT BLOOD, ED: Fecal Occult Bld: NEGATIVE

## 2016-09-28 LAB — COMPREHENSIVE METABOLIC PANEL
ALT: 10 U/L — ABNORMAL LOW (ref 14–54)
AST: 21 U/L (ref 15–41)
Albumin: 3.7 g/dL (ref 3.5–5.0)
Alkaline Phosphatase: 53 U/L (ref 38–126)
Anion gap: 8 (ref 5–15)
BUN: 24 mg/dL — ABNORMAL HIGH (ref 6–20)
CO2: 26 mmol/L (ref 22–32)
Calcium: 9.5 mg/dL (ref 8.9–10.3)
Chloride: 108 mmol/L (ref 101–111)
Creatinine, Ser: 1.54 mg/dL — ABNORMAL HIGH (ref 0.44–1.00)
GFR calc Af Amer: 33 mL/min — ABNORMAL LOW (ref >60)
GFR calc non Af Amer: 29 mL/min — ABNORMAL LOW (ref >60)
Glucose, Bld: 101 mg/dL — ABNORMAL HIGH (ref 65–99)
Potassium: 4.7 mmol/L (ref 3.5–5.1)
Sodium: 142 mmol/L (ref 135–145)
Total Bilirubin: 0.6 mg/dL (ref 0.3–1.2)
Total Protein: 6.7 g/dL (ref 6.5–8.1)

## 2016-09-28 LAB — TYPE AND SCREEN
ABO/RH(D): B POS
Antibody Screen: NEGATIVE

## 2016-09-28 LAB — CBC
HCT: 29.2 % — ABNORMAL LOW (ref 36.0–46.0)
Hemoglobin: 9.5 g/dL — ABNORMAL LOW (ref 12.0–15.0)
MCH: 29.5 pg (ref 26.0–34.0)
MCHC: 32.5 g/dL (ref 30.0–36.0)
MCV: 90.7 fL (ref 78.0–100.0)
Platelets: 251 10*3/uL (ref 150–400)
RBC: 3.22 MIL/uL — ABNORMAL LOW (ref 3.87–5.11)
RDW: 14.6 % (ref 11.5–15.5)
WBC: 3.1 10*3/uL — ABNORMAL LOW (ref 4.0–10.5)

## 2016-09-28 NOTE — ED Notes (Signed)
Bed: WA06 Expected date:  Expected time:  Means of arrival:  Comments: 

## 2016-09-28 NOTE — ED Provider Notes (Signed)
[facility]-EMERGENCY DEPT Provider Note   CSN: [id number] Arrival date & time: 09/28/16  0957     History   Chief Complaint Chief Complaint  Patient presents with  . Rectal Bleeding    HPI [name] is a 83 y.o. female.  HPI Patient is a 83 year old female presents emergency department complaining of rectal bleeding.  She states she's had a small amount of bleeding over the past 5 days that seems to be decreasing.  She called her doctor who recommended that she come to the ER.  She states some occasional cramping in her abdomen but no pain now.  She reports mainly stool in her bowel movement today with chest pain blood.  She felt weak and therefore her doctor was concerned that she could require blood transfusion     Past Medical History:  Diagnosis Date  . Acid reflux    takes Nexium daily  . Anemia    takes Ferrous Sulfate daily  . Aortic stenosis    mild AS pk grad 18, mean grad 9, AVA 1.32 cm 03/2015 echo (Dr. Jacinto Halim)  . Arthritis   . Diverticulitis   . Hematochezia 03/2015  . High cholesterol    can't take the meds  . History of blood transfusion 03/2015   no abnormal reaction noted  . History of GI diverticular bleed   . HTN (hypertension)    takes Losartan-HCTZ and Metoprolol daily  . Joint pain   . Joint swelling   . Nocturia   . Numbness    in legs  . Peripheral edema    occasionally  . Shortness of breath dyspnea    occasionally and with exertion  . Slow urinary stream    at times    Patient Active Problem List   Diagnosis Date Noted  . Hyperlipidemia 05/24/2015  . Aortic stenosis 05/24/2015  . GERD without esophagitis 05/23/2015  . Postoperative anemia due to acute blood loss 05/23/2015  . Osteoarthritis of left hip 05/17/2015  . 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 GI bleed 02/21/2015  . Acute bronchitis 11/16/2012  . Hypertension 11/13/2012  . Bright red blood per rectum 11/13/2012  . Lower GI bleed 02/23/2012  . History  of GI diverticular bleed 02/23/2012  . Diverticulosis 02/23/2012    Past Surgical History:  Procedure Laterality Date  . ABCESS DRAINAGE     abdominal abcess  . cataract surgery Bilateral   . CHOLECYSTECTOMY    . DILATION AND CURETTAGE OF UTERUS    . ESOPHAGOGASTRODUODENOSCOPY N/A 02/23/2015   Procedure: ESOPHAGOGASTRODUODENOSCOPY (EGD);  Surgeon: Carman Ching, MD;  Location: [facility] ENDOSCOPY;  Service: Endoscopy;  Laterality: N/A;  . FLEXIBLE SIGMOIDOSCOPY N/A 02/24/2015   Procedure: FLEXIBLE SIGMOIDOSCOPY;  Surgeon: Carman Ching, MD;  Location: [facility] ENDOSCOPY;  Service: Endoscopy;  Laterality: N/A;  . left knee surgery    . TOTAL HIP ARTHROPLASTY Left 05/17/2015   Procedure: LEFT TOTAL HIP ARTHROPLASTY ANTERIOR APPROACH;  Surgeon: Kathryne Hitch, MD;  Location: [facility] OR;  Service: Orthopedics;  Laterality: Left;    OB History    No data available       Home Medications    Prior to Admission medications   Medication Sig Start Date End Date Taking? Authorizing Provider  acetaminophen (TYLENOL) 325 MG tablet Take 325 mg by mouth every 6 (six) hours as needed. pain   Yes [provider]  aspirin EC 81 MG tablet Take 81 mg by mouth daily.   Yes [provider]  Cholecalciferol (VITAMIN D3) 2000 units TABS Take 2,000 Units by mouth daily.   Yes [provider]  esomeprazole (NEXIUM) 40 MG capsule Take 40 mg by mouth daily before breakfast.   Yes [provider]  ferrous sulfate 325 (65 FE) MG tablet Take 325 mg by mouth daily with breakfast.   Yes [provider]  HYDROcodone-acetaminophen (NORCO/VICODIN) 5-325 MG tablet Take 1-2 tablets by mouth every 4 (four) hours as needed (breakthrough pain). 05/20/15  Yes Kathryne Hitch, MD  losartan-hydrochlorothiazide (HYZAAR) 100-12.5 MG tablet Take 1 tablet by mouth daily.   Yes [provider]  metoprolol tartrate (LOPRESSOR) 25 MG tablet Take 25 mg by mouth 2 (two) times daily.    Yes [provider]  Multiple Vitamin (MULITIVITAMIN WITH MINERALS) TABS Take 1 tablet by mouth daily.   Yes [provider]  nitroGLYCERIN (NITROSTAT) 0.4 MG SL tablet Place 0.4 mg under the tongue every 5 (five) minutes as needed for chest pain.    Yes [provider]  polyethylene glycol powder (GLYCOLAX/MIRALAX) powder Take 17 g by mouth daily as needed for mild constipation.   Yes [provider]  aspirin EC 325 MG EC tablet Take 1 tablet (325 mg total) by mouth daily. Patient not taking: Reported on 09/28/2016 05/20/15   Kathryne Hitch, MD  methocarbamol (ROBAXIN) 500 MG tablet Take 1 tablet (500 mg total) by mouth every 6 (six) hours as needed for muscle spasms. Patient not taking: Reported on 09/28/2016 05/20/15   Kathryne Hitch, MD  methylPREDNISolone (MEDROL) 4 MG tablet Medrol dose pack. Take as instructed Patient not taking: Reported on 09/28/2016 08/06/16   Kathryne Hitch, MD    Family History Family History  Problem Relation Age of Onset  . Diabetes Other   . Hypertension Other   . Cancer Other   . Heart disease Other     Social History Social History  Substance Use Topics  . Smoking status: Former Games developer  . Smokeless tobacco: Never Used     Comment: quit smoking 8+ yrs ago  . Alcohol use No     Comment: quit yrs ago     Allergies   Ezetimibe; Lipitor [atorvastatin]; Pravachol [pravastatin]; Statins; Zocor [simvastatin]; and Cholestyramine   Review of Systems Review of Systems  All other systems reviewed and are negative.    Physical Exam Updated Vital Signs BP 138/75 (BP Location: Left Arm)   Pulse (!) 52   Temp 98 F (36.7 C) (Oral)   Resp 16   Ht 5\' 5"  (1.651 m)   Wt 91.6 kg (202 lb)   SpO2 100%   BMI 33.61 kg/m   Physical Exam  Constitutional: She is oriented to person, place, and time. She appears well-developed and well-nourished. No distress.  HENT:  Head: Normocephalic and  atraumatic.  Eyes: EOM are normal.  Neck: Normal range of motion.  Cardiovascular: Normal rate, regular rhythm and normal heart sounds.   Pulmonary/Chest: Effort normal and breath sounds normal.  Abdominal: Soft. She exhibits no distension. There is no tenderness.  Musculoskeletal: Normal range of motion.  Neurological: She is alert and oriented to person, place, and time.  Skin: Skin is warm and dry.  Psychiatric: She has a normal mood and affect. Judgment normal.  Nursing note and vitals reviewed.    ED Treatments / Results  Labs (all labs ordered are listed, but only abnormal results are displayed) Labs Reviewed  COMPREHENSIVE METABOLIC PANEL - Abnormal; Notable for the  following:       Result Value   Glucose, Bld 101 (*)    BUN 24 (*)    Creatinine, Ser 1.54 (*)    ALT 10 (*)    GFR calc non Af Amer 29 (*)    GFR calc Af Amer 33 (*)    All other components within normal limits  CBC - Abnormal; Notable for the following:    WBC 3.1 (*)    RBC 3.22 (*)    Hemoglobin 9.5 (*)    HCT 29.2 (*)    All other components within normal limits  POC OCCULT BLOOD, ED  TYPE AND SCREEN  ABO/RH   Hemoglobin  Date Value Ref Range Status  09/28/2016 9.5 (L) 12.0 - 15.0 g/dL Final  16/11/9602 8.6 (L) 12.0 - 15.0 g/dL Final  54/10/8117 9.4 (L) 12.0 - 15.0 g/dL Final    Comment:    REPEATED TO VERIFY POST TRANSFUSION SPECIMEN   05/19/2015 7.0 (L) 12.0 - 15.0 g/dL Final    Comment:    REPEATED TO VERIFY SPECIMEN CHECKED FOR CLOTS      EKG  EKG Interpretation None       Radiology No results found.    Procedures Procedures (including critical care time)  Medications Ordered in ED Medications - No data to display   Initial Impression / Assessment and Plan / ED Course  I have reviewed the triage vital signs and the nursing notes.  Pertinent labs & imaging results that were available during my care of the patient were reviewed by me and considered in my medical  decision making (see chart for details).     No indication for blood transfusion now.  It sounds like a rectal bleeding is decreasing.  She is well appearing.  No tenderness on her abdominal exam.  Orthostatics normal.  She understands return to the ER for new or worsening symptoms.  I asked that she return to the ER if she continued having rectal bleeding and/or increasing fatigue  Final Clinical Impressions(s) / ED Diagnoses   Final diagnoses:  Rectal bleeding    New Prescriptions New Prescriptions   No medications on file     Azalia Bilis, MD 09/28/16 1407

## 2016-09-28 NOTE — ED Triage Notes (Signed)
Patient is alert and oriented x4.  She is being seen for rectal bleeding.  Patient states that she called her PCP and was instructed to come to the ED due to her past Hx of diverticulosis.  Currently she rates her pain 3 of 10 with cramping.  Patient describes her stool as maroon.

## 2016-09-28 NOTE — ED Notes (Signed)
Hemocult card at bedside 

## 2016-09-29 LAB — ABO/RH: ABO/RH(D): B POS

## 2016-11-27 ENCOUNTER — Emergency Department (HOSPITAL_COMMUNITY): Payer: Medicare Other

## 2016-11-27 ENCOUNTER — Observation Stay (HOSPITAL_COMMUNITY)
Admission: EM | Admit: 2016-11-27 | Discharge: 2016-11-28 | Disposition: A | Discharge status: Home / Self Care | Payer: Medicare Other | Attending: Internal Medicine | Admitting: Internal Medicine

## 2016-11-27 ENCOUNTER — Encounter (HOSPITAL_COMMUNITY): Payer: Self-pay | Admitting: Emergency Medicine

## 2016-11-27 DIAGNOSIS — K219 Gastro-esophageal reflux disease without esophagitis: Secondary | ICD-10-CM | POA: Insufficient documentation

## 2016-11-27 DIAGNOSIS — M542 Cervicalgia: Secondary | ICD-10-CM | POA: Diagnosis not present

## 2016-11-27 DIAGNOSIS — I447 Left bundle-branch block, unspecified: Secondary | ICD-10-CM | POA: Insufficient documentation

## 2016-11-27 DIAGNOSIS — Z888 Allergy status to other drugs, medicaments and biological substances status: Secondary | ICD-10-CM | POA: Insufficient documentation

## 2016-11-27 DIAGNOSIS — Z79899 Other long term (current) drug therapy: Secondary | ICD-10-CM | POA: Insufficient documentation

## 2016-11-27 DIAGNOSIS — K449 Diaphragmatic hernia without obstruction or gangrene: Secondary | ICD-10-CM | POA: Diagnosis not present

## 2016-11-27 DIAGNOSIS — M1990 Unspecified osteoarthritis, unspecified site: Secondary | ICD-10-CM | POA: Insufficient documentation

## 2016-11-27 DIAGNOSIS — I110 Hypertensive heart disease with heart failure: Secondary | ICD-10-CM | POA: Insufficient documentation

## 2016-11-27 DIAGNOSIS — Z87891 Personal history of nicotine dependence: Secondary | ICD-10-CM | POA: Diagnosis not present

## 2016-11-27 DIAGNOSIS — I509 Heart failure, unspecified: Secondary | ICD-10-CM | POA: Insufficient documentation

## 2016-11-27 DIAGNOSIS — I10 Essential (primary) hypertension: Secondary | ICD-10-CM | POA: Diagnosis not present

## 2016-11-27 DIAGNOSIS — Z7982 Long term (current) use of aspirin: Secondary | ICD-10-CM | POA: Insufficient documentation

## 2016-11-27 DIAGNOSIS — R079 Chest pain, unspecified: Principal | ICD-10-CM | POA: Diagnosis present

## 2016-11-27 LAB — BASIC METABOLIC PANEL
Anion gap: 7 (ref 5–15)
BUN: 34 mg/dL — ABNORMAL HIGH (ref 6–20)
CO2: 26 mmol/L (ref 22–32)
Calcium: 9 mg/dL (ref 8.9–10.3)
Chloride: 104 mmol/L (ref 101–111)
Creatinine, Ser: 1.58 mg/dL — ABNORMAL HIGH (ref 0.44–1.00)
GFR calc Af Amer: 32 mL/min — ABNORMAL LOW (ref >60)
GFR calc non Af Amer: 27 mL/min — ABNORMAL LOW (ref >60)
Glucose, Bld: 98 mg/dL (ref 65–99)
Potassium: 4.1 mmol/L (ref 3.5–5.1)
Sodium: 137 mmol/L (ref 135–145)

## 2016-11-27 LAB — CBC
HCT: 31.4 % — ABNORMAL LOW (ref 36.0–46.0)
Hemoglobin: 10.2 g/dL — ABNORMAL LOW (ref 12.0–15.0)
MCH: 30.2 pg (ref 26.0–34.0)
MCHC: 32.5 g/dL (ref 30.0–36.0)
MCV: 92.9 fL (ref 78.0–100.0)
Platelets: 262 10*3/uL (ref 150–400)
RBC: 3.38 MIL/uL — ABNORMAL LOW (ref 3.87–5.11)
RDW: 14.5 % (ref 11.5–15.5)
WBC: 4.4 10*3/uL (ref 4.0–10.5)

## 2016-11-27 LAB — I-STAT TROPONIN, ED: Troponin i, poc: 0 ng/mL (ref 0.00–0.08)

## 2016-11-27 MED ORDER — FERROUS SULFATE 325 (65 FE) MG PO TABS
325.0000 mg | ORAL_TABLET | Freq: Every day | ORAL | Status: DC
  Administered 2016-11-28: 325 mg via ORAL
  Filled 2016-11-27: qty 1

## 2016-11-27 MED ORDER — ONDANSETRON HCL 4 MG/2ML IJ SOLN
4.0000 mg | Freq: Four times a day (QID) | INTRAMUSCULAR | Status: DC | PRN
Start: 2016-11-27 — End: 2016-11-28

## 2016-11-27 MED ORDER — PANTOPRAZOLE SODIUM 40 MG PO TBEC
80.0000 mg | DELAYED_RELEASE_TABLET | Freq: Every day | ORAL | Status: DC
  Administered 2016-11-28: 80 mg via ORAL
  Filled 2016-11-27: qty 2

## 2016-11-27 MED ORDER — ADULT MULTIVITAMIN W/MINERALS CH
1.0000 | ORAL_TABLET | Freq: Every day | ORAL | Status: DC
  Administered 2016-11-28: 1 via ORAL
  Filled 2016-11-27: qty 1

## 2016-11-27 MED ORDER — ACETAMINOPHEN 325 MG PO TABS: 325.0000 mg | ORAL_TABLET | Freq: Four times a day (QID) | ORAL | Status: DC | PRN

## 2016-11-27 MED ORDER — ASPIRIN EC 81 MG PO TBEC
81.0000 mg | DELAYED_RELEASE_TABLET | Freq: Every day | ORAL | Status: DC
  Administered 2016-11-28: 81 mg via ORAL
  Filled 2016-11-27: qty 1

## 2016-11-27 MED ORDER — VITAMIN D 1000 UNITS PO TABS
2000.0000 [IU] | ORAL_TABLET | Freq: Every day | ORAL | Status: DC
  Administered 2016-11-28: 2000 [IU] via ORAL
  Filled 2016-11-27: qty 2

## 2016-11-27 MED ORDER — LOSARTAN POTASSIUM-HCTZ 100-12.5 MG PO TABS: 1.0000 | ORAL_TABLET | Freq: Every day | ORAL | Status: DC

## 2016-11-27 MED ORDER — POLYETHYLENE GLYCOL 3350 17 G PO PACK: 17.0000 g | PACK | Freq: Every day | ORAL | Status: DC | PRN

## 2016-11-27 MED ORDER — METOPROLOL TARTRATE 25 MG PO TABS
25.0000 mg | ORAL_TABLET | Freq: Two times a day (BID) | ORAL | Status: DC
  Administered 2016-11-28: 25 mg via ORAL
  Filled 2016-11-27: qty 1

## 2016-11-27 MED ORDER — ENOXAPARIN SODIUM 30 MG/0.3ML ~~LOC~~ SOLN: 30.0000 mg | Freq: Every day | SUBCUTANEOUS | Status: DC

## 2016-11-27 MED ORDER — ACETAMINOPHEN 325 MG PO TABS: 650.0000 mg | ORAL_TABLET | ORAL | Status: DC | PRN

## 2016-11-27 NOTE — ED Provider Notes (Signed)
[facility] EMERGENCY DEPARTMENT Provider Note   CSN: [id number] Arrival date & time: 11/27/16  2120   History   Chief Complaint Chief Complaint  Patient presents with  . Chest Pain    HPI [name] is a 83 y.o. female.  The patient is an independent 83 year old female with a past medical history significant for aortic stenosis, hypertension, hyperlipidemia, diverticulitis, chronic anemia, and GERD, who presents to the ED complaining of chest pain.  The patient's pain has been present since this morning at ~8am.  It began suddenly and radiates to her left arm and neck.  She took x2 nitroglycerin with some relief of her pain, and subsequently called EMS for transportation to the ED. 325 mg aspirin was administered by EMS. The patient is chest pain-free at this time, although she can still feel pain in her left lateral neck and left upper extremity. She also reports an abnormal sensation in her throat that is causing her to cough; she feels like she needs to cough up phlegm, but is having a difficult time getting it out. Lastly, the patient reports that she has a "vein problem" to her neck; she is unsure which side is affected, but states she was diagnosed with this last year when having her hip surgery.  No fevers, diaphoresis, nausea, or vomiting.   The history is provided by the patient and medical records. No language interpreter was used.   Past Medical History:  Diagnosis Date  . Acid reflux    takes Nexium daily  . Anemia    takes Ferrous Sulfate daily  . Aortic stenosis    mild AS pk grad 18, mean grad 9, AVA 1.32 cm 03/2015 echo (Dr. Jacinto Halim)  . Arthritis   . Diverticulitis   . Hematochezia 03/2015  . High cholesterol    can't take the meds  . History of blood transfusion 03/2015   no abnormal reaction noted  . History of GI diverticular bleed   . HTN (hypertension)    takes Losartan-HCTZ and Metoprolol daily  . Joint pain   . Joint swelling   .  Nocturia   . Numbness    in legs  . Peripheral edema    occasionally  . Shortness of breath dyspnea    occasionally and with exertion  . Slow urinary stream    at times    Patient Active Problem List   Diagnosis Date Noted  . Hyperlipidemia 05/24/2015  . Aortic stenosis 05/24/2015  . GERD without esophagitis 05/23/2015  . Postoperative anemia due to acute blood loss 05/23/2015  . Osteoarthritis of left hip 05/17/2015  . 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 GI bleed 02/21/2015  . Acute bronchitis 11/16/2012  . Hypertension 11/13/2012  . Bright red blood per rectum 11/13/2012  . Lower GI bleed 02/23/2012  . History of GI diverticular bleed 02/23/2012  . Diverticulosis 02/23/2012    Past Surgical History:  Procedure Laterality Date  . ABCESS DRAINAGE     abdominal abcess  . cataract surgery Bilateral   . CHOLECYSTECTOMY    . DILATION AND CURETTAGE OF UTERUS    . ESOPHAGOGASTRODUODENOSCOPY N/A 02/23/2015   Procedure: ESOPHAGOGASTRODUODENOSCOPY (EGD);  Surgeon: Carman Ching, MD;  Location: [facility] ENDOSCOPY;  Service: Endoscopy;  Laterality: N/A;  . FLEXIBLE SIGMOIDOSCOPY N/A 02/24/2015   Procedure: FLEXIBLE SIGMOIDOSCOPY;  Surgeon: Carman Ching, MD;  Location: [facility] ENDOSCOPY;  Service: Endoscopy;  Laterality: N/A;  . left knee surgery    .  TOTAL HIP ARTHROPLASTY Left 05/17/2015   Procedure: LEFT TOTAL HIP ARTHROPLASTY ANTERIOR APPROACH;  Surgeon: Kathryne Hitch, MD;  Location: [facility] OR;  Service: Orthopedics;  Laterality: Left;    OB History    No data available       Home Medications    Prior to Admission medications   Medication Sig Start Date End Date Taking? Authorizing Provider  acetaminophen (TYLENOL) 325 MG tablet Take 325 mg by mouth every 6 (six) hours as needed. pain   Yes [provider]  aspirin EC 81 MG tablet Take 81 mg by mouth daily.   Yes [provider]  Cholecalciferol (VITAMIN D3) 2000 units TABS Take 2,000 Units  by mouth daily.   Yes [provider]  esomeprazole (NEXIUM) 40 MG capsule Take 40 mg by mouth daily before breakfast.   Yes [provider]  ferrous sulfate 325 (65 FE) MG tablet Take 325 mg by mouth daily with breakfast.   Yes [provider]  HYDROcodone-acetaminophen (NORCO/VICODIN) 5-325 MG tablet Take 1-2 tablets by mouth every 4 (four) hours as needed (breakthrough pain). 05/20/15  Yes Kathryne Hitch, MD  losartan-hydrochlorothiazide (HYZAAR) 100-12.5 MG tablet Take 1 tablet by mouth daily.   Yes [provider]  metoprolol tartrate (LOPRESSOR) 25 MG tablet Take 25 mg by mouth 2 (two) times daily.   Yes [provider]  Multiple Vitamin (MULITIVITAMIN WITH MINERALS) TABS Take 1 tablet by mouth daily.   Yes [provider]  nitroGLYCERIN (NITROSTAT) 0.4 MG SL tablet Place 0.4 mg under the tongue every 5 (five) minutes as needed for chest pain.    Yes [provider]  polyethylene glycol powder (GLYCOLAX/MIRALAX) powder Take 17 g by mouth daily as needed for mild constipation.   Yes [provider]    Family History Family History  Problem Relation Age of Onset  . Diabetes Other   . Hypertension Other   . Cancer Other   . Heart disease Other     Social History Social History  Substance Use Topics  . Smoking status: Former Games developer  . Smokeless tobacco: Never Used     Comment: quit smoking 8+ yrs ago  . Alcohol use No     Comment: quit yrs ago     Allergies   Ezetimibe; Lipitor [atorvastatin]; Pravachol [pravastatin]; Statins; Zocor [simvastatin]; and Cholestyramine   Review of Systems Review of Systems  Constitutional: Negative for chills and fever.  Respiratory: Positive for cough (non-productive).   Cardiovascular: Positive for chest pain and leg swelling (chronic; stable). Negative for palpitations.  Gastrointestinal: Negative for abdominal pain, constipation, diarrhea, nausea and vomiting.    Genitourinary: Negative.   Musculoskeletal: Negative.   Skin: Negative.   Allergic/Immunologic: Negative for immunocompromised state.  Neurological: Negative for dizziness and light-headedness.  Hematological: Negative.   Psychiatric/Behavioral: Negative.   All other systems reviewed and are negative.  Physical Exam Updated Vital Signs BP (!) 148/68 (BP Location: Right Arm)   Pulse (!) 54   Temp 98.3 F (36.8 C) (Oral)   Resp 20   Ht  (1.6 m)   Wt 92.5 kg (204 lb)   SpO2 100%   BMI 36.14 kg/m   Physical Exam  Constitutional: She is oriented to person, place, and time. She appears well-developed and well-nourished. No distress.  HENT:  Head: Normocephalic and atraumatic.  Eyes: Conjunctivae and EOM are normal.  Neck: Neck supple.  Cardiovascular: Normal rate and regular rhythm.   Murmur heard. Pulmonary/Chest: Effort  normal and breath sounds normal. No stridor. No respiratory distress. She has no wheezes.  Abdominal: Soft. She exhibits no mass. There is no tenderness. There is no guarding.  Musculoskeletal: She exhibits edema (bilateral pitting edema to lower extremities).  Neurological: She is alert and oriented to person, place, and time.  Skin: Skin is warm and dry.  Psychiatric: She has a normal mood and affect. Her behavior is normal. Judgment and thought content normal.  Nursing note and vitals reviewed.  ED Treatments / Results  Labs (all labs ordered are listed, but only abnormal results are displayed) Labs Reviewed  BASIC METABOLIC PANEL  CBC  I-STAT TROPONIN, ED   EKG  EKG Interpretation  Date/Time:  Tuesday November 27 2016 21:32:01 EDT Ventricular Rate:  56 PR Interval:    QRS Duration: 146 QT Interval:  470 QTC Calculation: 454 R Axis:   113 Text Interpretation:  Sinus rhythm Prolonged PR interval Left bundle branch block When compared with ECG of 02/21/2015, Left bundle branch block is now present Confirmed by Dione Booze ([id number]) on 11/27/2016  9:35:42 PM      Radiology No results found.  Procedures Procedures (including critical care time)  Medications Ordered in ED Medications - No data to display   Initial Impression / Assessment and Plan / ED Course  I have reviewed the triage vital signs and the nursing notes.  Pertinent labs & imaging results that were available during my care of the patient were reviewed by me and considered in my medical decision making (see chart for details).   Initial differential diagnosis included ACS, PE, pneumonia, PTX, pleural effusion, bronchitis, and musculoskeletal strain/sprain.  Pertinent labs included normocytic anemia (Hgb 10.2, greater than recent studies) and an elevated serum creatinine 1.58, consistent with baseline.  Initial troponin negative.  EKG with normal sinus rhythm and RAD.  Prolonged PR interval, narrow QRS, and normal QTc.  New LBBB when compared to EKG from 02/21/2015.  Imaging studies notable for cardiomegaly, however no acute cardiopulmonary processes.  Aortic atherosclerosis noted.    The patient's HEAR score was 7 on my calculation.  Given this and her concerning story, I believe she will benefit from further work-up and evaluation including an echocardiogram and stress test.  The patient was admitted to the Hospitalist service for further evaluation and treatment.  The patient was in stable condition at the time of admission.  Final Clinical Impressions(s) / ED Diagnoses   Final diagnoses:  Chest pain, unspecified type   New Prescriptions New Prescriptions   No medications on file     Levester Fresh, MD 11/28/16 0006    Dione Booze, MD 11/29/16 [phone]

## 2016-11-27 NOTE — H&P (Signed)
History and Physical    [name]:[id number] DOB: [birth year] DOA: 11/27/2016  PCP: Fleet Contras, MD  Patient coming from: Home  I have personally briefly reviewed patient's old medical records in [facility]  Chief Complaint: Chest pain  HPI: [name] is a 83 y.o. female with medical history significant of HTN.  Patient developed CP this morning.  Was relieved with NTG.  This evening developed neck pain with radiation to L shoulder and arm, this also was improved with NTG.  Called for ambulance, they gave ASA, CP resolved completely.  No associated nausea, dyspnea.   ED Course: EKG shows LBBB that wasn't present on last EKG in Jan 2017.  Trop neg.   Review of Systems: As per HPI otherwise 10 point review of systems negative.   Past Medical History:  Diagnosis Date  . Acid reflux    takes Nexium daily  . Anemia    takes Ferrous Sulfate daily  . Aortic stenosis    mild AS pk grad 18, mean grad 9, AVA 1.32 cm 03/2015 echo (Dr. Jacinto Halim)  . Arthritis   . Diverticulitis   . Hematochezia 03/2015  . High cholesterol    can't take the meds  . History of blood transfusion 03/2015   no abnormal reaction noted  . History of GI diverticular bleed   . HTN (hypertension)    takes Losartan-HCTZ and Metoprolol daily  . Joint pain   . Joint swelling   . Nocturia   . Numbness    in legs  . Peripheral edema    occasionally  . Shortness of breath dyspnea    occasionally and with exertion  . Slow urinary stream    at times    Past Surgical History:  Procedure Laterality Date  . ABCESS DRAINAGE     abdominal abcess  . cataract surgery Bilateral   . CHOLECYSTECTOMY    . DILATION AND CURETTAGE OF UTERUS    . ESOPHAGOGASTRODUODENOSCOPY N/A 02/23/2015   Procedure: ESOPHAGOGASTRODUODENOSCOPY (EGD);  Surgeon: Carman Ching, MD;  Location: [facility] ENDOSCOPY;  Service: Endoscopy;  Laterality: N/A;  . FLEXIBLE SIGMOIDOSCOPY N/A 02/24/2015   Procedure: FLEXIBLE SIGMOIDOSCOPY;   Surgeon: Carman Ching, MD;  Location: [facility] ENDOSCOPY;  Service: Endoscopy;  Laterality: N/A;  . left knee surgery    . TOTAL HIP ARTHROPLASTY Left 05/17/2015   Procedure: LEFT TOTAL HIP ARTHROPLASTY ANTERIOR APPROACH;  Surgeon: Kathryne Hitch, MD;  Location: [facility] OR;  Service: Orthopedics;  Laterality: Left;     reports that she has quit smoking. She has never used smokeless tobacco. She reports that she does not drink alcohol or use drugs.  Allergies  Allergen Reactions  . Ezetimibe Anaphylaxis  . Lipitor [Atorvastatin] Swelling  . Pravachol [Pravastatin] Swelling  . Statins Swelling and Other (See Comments)    Swelling of mouth and lips  . Zocor [Simvastatin] Swelling  . Cholestyramine Nausea And Vomiting    Family History  Problem Relation Age of Onset  . Diabetes Other   . Hypertension Other   . Cancer Other   . Heart disease Other      Prior to Admission medications   Medication Sig Start Date End Date Taking? Authorizing Provider  acetaminophen (TYLENOL) 325 MG tablet Take 325 mg by mouth every 6 (six) hours as needed. pain   Yes [provider]  aspirin EC 81 MG tablet Take 81 mg by mouth daily.   Yes [provider]  Cholecalciferol (VITAMIN D3) 2000 units  TABS Take 2,000 Units by mouth daily.   Yes [provider]  esomeprazole (NEXIUM) 40 MG capsule Take 40 mg by mouth daily before breakfast.   Yes [provider]  ferrous sulfate 325 (65 FE) MG tablet Take 325 mg by mouth daily with breakfast.   Yes [provider]  losartan-hydrochlorothiazide (HYZAAR) 100-12.5 MG tablet Take 1 tablet by mouth daily.   Yes [provider]  metoprolol tartrate (LOPRESSOR) 25 MG tablet Take 25 mg by mouth 2 (two) times daily.   Yes [provider]  Multiple Vitamin (MULITIVITAMIN WITH MINERALS) TABS Take 1 tablet by mouth daily.   Yes [provider]  nitroGLYCERIN (NITROSTAT) 0.4 MG SL tablet Place 0.4 mg under  the tongue every 5 (five) minutes as needed for chest pain.    Yes [provider]  polyethylene glycol powder (GLYCOLAX/MIRALAX) powder Take 17 g by mouth daily as needed for mild constipation.   Yes [provider]    Physical Exam: Vitals:   11/27/16 2130 11/27/16 2138 11/27/16 2215 11/27/16 2300  BP:  (!) 148/68 132/63 122/76  Pulse:  (!) 54 (!) 55 (!) 54  Resp:  Temp: 98.3 F (36.8 C)     TempSrc: Oral     SpO2:  100% 100% 100%  Weight:      Height:        Constitutional: NAD, calm, comfortable Eyes: PERRL, lids and conjunctivae normal ENMT: Mucous membranes are moist. Posterior pharynx clear of any exudate or lesions.Normal dentition.  Neck: normal, supple, no masses, no thyromegaly Respiratory: clear to auscultation bilaterally, no wheezing, no crackles. Normal respiratory effort. No accessory muscle use.  Cardiovascular: Regular rate and rhythm, no murmurs / rubs / gallops. No extremity edema. 2+ pedal pulses. No carotid bruits.  Abdomen: no tenderness, no masses palpated. No hepatosplenomegaly. Bowel sounds positive.  Musculoskeletal: no clubbing / cyanosis. No joint deformity upper and lower extremities. Good ROM, no contractures. Normal muscle tone.  Skin: no rashes, lesions, ulcers. No induration Neurologic: CN 2-12 grossly intact. Sensation intact, DTR normal. Strength 5/5 in all 4.  Psychiatric: Normal judgment and insight. Alert and oriented x 3. Normal mood.    Labs on Admission: I have personally reviewed following labs and imaging studies  CBC:  Recent Labs Lab 11/27/16 2132  WBC 4.4  HGB 10.2*  HCT 31.4*  MCV 92.9  PLT 262   Basic Metabolic Panel:  Recent Labs Lab 11/27/16 2132  NA 137  K 4.1  CL 104  CO2 26  GLUCOSE 98  BUN 34*  CREATININE 1.58*  CALCIUM 9.0   GFR: Estimated Creatinine Clearance: 25 mL/min (A) (by C-G formula based on SCr of 1.58 mg/dL (H)). Liver Function Tests: No results for input(s): AST,  ALT, ALKPHOS, BILITOT, PROT, ALBUMIN in the last 168 hours. No results for input(s): LIPASE, AMYLASE in the last 168 hours. No results for input(s): AMMONIA in the last 168 hours. Coagulation Profile: No results for input(s): INR, PROTIME in the last 168 hours. Cardiac Enzymes: No results for input(s): CKTOTAL, CKMB, CKMBINDEX, TROPONINI in the last 168 hours. BNP (last 3 results) No results for input(s): PROBNP in the last 8760 hours. HbA1C: No results for input(s): HGBA1C in the last 72 hours. CBG: No results for input(s): GLUCAP in the last 168 hours. Lipid Profile: No results for input(s): CHOL, HDL, LDLCALC, TRIG, CHOLHDL, LDLDIRECT in the last 72 hours. Thyroid Function Tests: No results for input(s): TSH, T4TOTAL, FREET4,  T3FREE, THYROIDAB in the last 72 hours. Anemia Panel: No results for input(s): VITAMINB12, FOLATE, FERRITIN, TIBC, IRON, RETICCTPCT in the last 72 hours. Urine analysis:    Component Value Date/Time   COLORURINE YELLOW 09/08/2014 1319   APPEARANCEUR CLEAR 09/08/2014 1319   LABSPEC 1.018 09/08/2014 1319   PHURINE 7.0 09/08/2014 1319   GLUCOSEU NEGATIVE 09/08/2014 1319   HGBUR TRACE (A) 09/08/2014 1319   BILIRUBINUR NEGATIVE 09/08/2014 1319   KETONESUR NEGATIVE 09/08/2014 1319   PROTEINUR NEGATIVE 09/08/2014 1319   UROBILINOGEN 1.0 09/08/2014 1319   NITRITE NEGATIVE 09/08/2014 1319   LEUKOCYTESUR NEGATIVE 09/08/2014 1319    Radiological Exams on Admission: Dg Chest 2 View  Result Date: 11/27/2016 CLINICAL DATA:  Left chest pain extending to the left arm and left neck since our on 8 o'clock this morning. History of aortic stenosis, hypertension, shortness of breath, CHF, previous smoker. EXAM: CHEST  2 VIEW COMPARISON:  CT chest 11/15/2012.  Chest 11/15/2012. FINDINGS: Shallow inspiration. Mild cardiac enlargement. No pulmonary vascular congestion. No edema or consolidation. No blunting of costophrenic angles. No pneumothorax. Calcified and tortuous  aorta. Degenerative changes in the spine and shoulders. IMPRESSION: Cardiac enlargement. No evidence of active pulmonary disease. Aortic atherosclerosis. Electronically Signed   By: Burman Nieves M.D.   On: 11/27/2016 22:14    EKG: Independently reviewed.  Assessment/Plan Principal Problem:   Chest pain, rule out acute myocardial infarction Active Problems:   Hypertension    1. CP - 1. CP obs pathway 2. Serial trops 3. Tele monitor 4. NPO after midnight 5. Cards eval in AM 2. HTN - continue home BP meds  DVT prophylaxis: Lovenox Code Status: Full Family Communication: No family in room Disposition Plan: Home after admit Consults called: None called, put message in epic to cards for routine consult Admission status: Place in obs   Hillary Bow. DO [facility] Pager [phone]  If 7AM-7PM, please contact day team taking care of patient [email or web address] Password [email or web address]  11/27/2016, 11:45 PM

## 2016-11-27 NOTE — ED Triage Notes (Signed)
Pt from home has L arm and neck pain, pt took 2 nitro with resolution of pain. Pt does have CHF.  aspirin given

## 2016-11-28 ENCOUNTER — Encounter (HOSPITAL_COMMUNITY): Payer: Self-pay | Admitting: Cardiology

## 2016-11-28 DIAGNOSIS — I10 Essential (primary) hypertension: Secondary | ICD-10-CM | POA: Diagnosis not present

## 2016-11-28 DIAGNOSIS — K449 Diaphragmatic hernia without obstruction or gangrene: Secondary | ICD-10-CM | POA: Diagnosis not present

## 2016-11-28 DIAGNOSIS — R079 Chest pain, unspecified: Principal | ICD-10-CM

## 2016-11-28 LAB — TROPONIN I
Troponin I: <0.03 ng/mL (ref <0.03)
Troponin I: <0.03 ng/mL (ref <0.03)
Troponin I: <0.03 ng/mL (ref <0.03)

## 2016-11-28 MED ORDER — GI COCKTAIL ~~LOC~~: 30.0000 mL | Freq: Two times a day (BID) | ORAL | Status: DC | PRN

## 2016-11-28 MED ORDER — NITROGLYCERIN 0.2 MG/HR TD PT24: 0.2000 mg | MEDICATED_PATCH | Freq: Every day | TRANSDERMAL | Status: DC

## 2016-11-28 MED ORDER — HYDROCHLOROTHIAZIDE 12.5 MG PO CAPS
12.5000 mg | ORAL_CAPSULE | Freq: Every day | ORAL | Status: DC
  Filled 2016-11-28: qty 1

## 2016-11-28 MED ORDER — LOSARTAN POTASSIUM 50 MG PO TABS
100.0000 mg | ORAL_TABLET | Freq: Every day | ORAL | Status: DC
  Administered 2016-11-28: 100 mg via ORAL
  Filled 2016-11-28: qty 2

## 2016-11-28 NOTE — Plan of Care (Signed)
Problem: Activity: Goal: Risk for activity intolerance will decrease Outcome: Progressing Patient has been ambulating well, stand by assist.

## 2016-11-28 NOTE — Consult Note (Signed)
Referring Physician:  [name] is an 83 y.o. female.                       Chief Complaint: Chest pain  HPI: 83 year old female with PMH of GI bleed, anemia, hypertension, arthritis, hyperlipidemia and aortic stenosis has chest pain relieved by one SL NTG use. Troponin-Is are negative x 3. EKG shows SR + LBBB. Chest x-ray shows cardiac enlargement and aortic atherosclerosis.   Past Medical History:  Diagnosis Date  . Acid reflux    takes Nexium daily  . Anemia    takes Ferrous Sulfate daily  . Aortic stenosis    mild AS pk grad 18, mean grad 9, AVA 1.32 cm 03/2015 echo (Dr. Einar Gip)  . Arthritis   . Diverticulitis   . Hematochezia 03/2015  . High cholesterol    can't take the meds  . History of blood transfusion 03/2015   no abnormal reaction noted  . History of GI diverticular bleed   . HTN (hypertension)    takes Losartan-HCTZ and Metoprolol daily  . Joint pain   . Joint swelling   . Nocturia   . Numbness    in legs  . Peripheral edema    occasionally  . Shortness of breath dyspnea    occasionally and with exertion  . Slow urinary stream    at times      Past Surgical History:  Procedure Laterality Date  . ABCESS DRAINAGE     abdominal abcess  . cataract surgery Bilateral   . CHOLECYSTECTOMY    . DILATION AND CURETTAGE OF UTERUS    . ESOPHAGOGASTRODUODENOSCOPY N/A 02/23/2015   Procedure: ESOPHAGOGASTRODUODENOSCOPY (EGD);  Surgeon: Laurence Spates, MD;  Location: [facility] ENDOSCOPY;  Service: Endoscopy;  Laterality: N/A;  . FLEXIBLE SIGMOIDOSCOPY N/A 02/24/2015   Procedure: FLEXIBLE SIGMOIDOSCOPY;  Surgeon: Laurence Spates, MD;  Location: [place];  Service: Endoscopy;  Laterality: N/A;  . left knee surgery    . TOTAL HIP ARTHROPLASTY Left 05/17/2015   Procedure: LEFT TOTAL HIP ARTHROPLASTY ANTERIOR APPROACH;  Surgeon: Mcarthur Rossetti, MD;  Location: [place];  Service: Orthopedics;  Laterality: Left;    Family History  Problem Relation Age of Onset  . Diabetes  Other   . Hypertension Other   . Cancer Other   . Heart disease Other    Social History:  reports that she has quit smoking. She has never used smokeless tobacco. She reports that she does not drink alcohol or use drugs.  Allergies:  Allergies  Allergen Reactions  . Ezetimibe Anaphylaxis  . Lipitor [Atorvastatin] Swelling  . Pravachol [Pravastatin] Swelling  . Statins Swelling and Other (See Comments)    Swelling of mouth and lips  . Zocor [Simvastatin] Swelling  . Cholestyramine Nausea And Vomiting    Medications Prior to Admission  Medication Sig Dispense Refill  . acetaminophen (TYLENOL) 325 MG tablet Take 325 mg by mouth every 6 (six) hours as needed. pain    . aspirin EC 81 MG tablet Take 81 mg by mouth daily.    . Cholecalciferol (VITAMIN D3) 2000 units TABS Take 2,000 Units by mouth daily.    Marland Kitchen esomeprazole (NEXIUM) 40 MG capsule Take 40 mg by mouth daily before breakfast.    . ferrous sulfate 325 (65 FE) MG tablet Take 325 mg by mouth daily with breakfast.    . losartan-hydrochlorothiazide (HYZAAR) 100-12.5 MG tablet Take 1 tablet by mouth daily.    . metoprolol  tartrate (LOPRESSOR) 25 MG tablet Take 25 mg by mouth 2 (two) times daily.    . Multiple Vitamin (MULITIVITAMIN WITH MINERALS) TABS Take 1 tablet by mouth daily.    . nitroGLYCERIN (NITROSTAT) 0.4 MG SL tablet Place 0.4 mg under the tongue every 5 (five) minutes as needed for chest pain.     . polyethylene glycol powder (GLYCOLAX/MIRALAX) powder Take 17 g by mouth daily as needed for mild constipation.      Results for orders placed or performed during the hospital encounter of 11/27/16 (from the past 48 hour(s))  Basic metabolic panel     Status: Abnormal   Collection Time: 11/27/16  9:32 PM  Result Value Ref Range   Sodium 137 135 - 145 mmol/L   Potassium 4.1 3.5 - 5.1 mmol/L   Chloride 104 101 - 111 mmol/L   CO2 26 22 - 32 mmol/L   Glucose, Bld 98 65 - 99 mg/dL   BUN 34 (H) 6 - 20 mg/dL   Creatinine, Ser  1.58 (H) 0.44 - 1.00 mg/dL   Calcium 9.0 8.9 - 10.3 mg/dL   GFR calc non Af Amer 27 (L) >60 mL/min   GFR calc Af Amer 32 (L) >60 mL/min    Comment: (NOTE) The eGFR has been calculated using the CKD EPI equation. This calculation has not been validated in all clinical situations. eGFR's persistently <60 mL/min signify possible Chronic Kidney Disease.    Anion gap 7 5 - 15  CBC     Status: Abnormal   Collection Time: 11/27/16  9:32 PM  Result Value Ref Range   WBC 4.4 4.0 - 10.5 K/uL   RBC 3.38 (L) 3.87 - 5.11 MIL/uL   Hemoglobin 10.2 (L) 12.0 - 15.0 g/dL   HCT 31.4 (L) 36.0 - 46.0 %   MCV 92.9 78.0 - 100.0 fL   MCH 30.2 26.0 - 34.0 pg   MCHC 32.5 30.0 - 36.0 g/dL   RDW 14.5 11.5 - 15.5 %   Platelets 262 150 - 400 K/uL  I-stat troponin, ED     Status: None   Collection Time: 11/27/16  9:42 PM  Result Value Ref Range   Troponin i, poc 0.00 0.00 - 0.08 ng/mL   Comment 3            Comment: Due to the release kinetics of cTnI, a negative result within the first hours of the onset of symptoms does not rule out myocardial infarction with certainty. If myocardial infarction is still suspected, repeat the test at appropriate intervals.   Troponin I-serum (0, 3, 6 hours)     Status: None   Collection Time: 11/27/16 11:55 PM  Result Value Ref Range   Troponin I <0.03 <0.03 ng/mL  Troponin I-serum (0, 3, 6 hours)     Status: None   Collection Time: 11/28/16  2:18 AM  Result Value Ref Range   Troponin I <0.03 <0.03 ng/mL  Troponin I-serum (0, 3, 6 hours)     Status: None   Collection Time: 11/28/16  5:13 AM  Result Value Ref Range   Troponin I <0.03 <0.03 ng/mL   Dg Chest 2 View  Result Date: 11/27/2016 CLINICAL DATA:  Left chest pain extending to the left arm and left neck since our on 8 o'clock this morning. History of aortic stenosis, hypertension, shortness of breath, CHF, previous smoker. EXAM: CHEST  2 VIEW COMPARISON:  CT chest 11/15/2012.  Chest 11/15/2012. FINDINGS:  Shallow inspiration. Mild cardiac enlargement. No  pulmonary vascular congestion. No edema or consolidation. No blunting of costophrenic angles. No pneumothorax. Calcified and tortuous aorta. Degenerative changes in the spine and shoulders. IMPRESSION: Cardiac enlargement. No evidence of active pulmonary disease. Aortic atherosclerosis. Electronically Signed   By: Lucienne Capers M.D.   On: 11/27/2016 22:14    Review Of Systems Constitutional: No fever, chills, weight loss or gain. Eyes: No vision change, wears glasses. No discharge or pain. Ears: Mild hearing loss, No tinnitus. Respiratory: No asthma, COPD, pneumonias. Positive shortness of breath. No hemoptysis. Cardiovascular: Occasional chest pain, palpitation, leg edema. Gastrointestinal: No nausea, vomiting, diarrhea, constipation. Positive GI bleed. No hepatitis. Genitourinary: No dysuria, hematuria, kidney stone. No incontinance. Neurological: Occasional headache, no stroke, seizures.  Psychiatry: No psych facility admission for anxiety, depression, suicide. No detox. Skin: No rash. Musculoskeletal: Positive joint pain, fibromyalgia. No neck pain, back pain. Lymphadenopathy: No lymphadenopathy. Hematology: Positive anemia, no easy bruising.   Blood pressure 113/60, pulse (!) 52, temperature 97.6 F (36.4 C), temperature source Oral, resp. rate 18, height '5\' 3"'  (1.6 m), weight 90.7 kg (199 lb 14.4 oz), SpO2 100 %. Body mass index is 35.41 kg/m. General appearance: alert, cooperative, appears stated age and no distress Head: Normocephalic, atraumatic. Eyes: Brown eyes, Pale pink conjunctiva, corneas clear. PERRL, EOM's intact. Neck: No adenopathy, no carotid bruit, no JVD, supple, symmetrical, trachea midline and thyroid not enlarged. Resp: Clear to auscultation bilaterally. Cardio: Regular rate and rhythm, S1, S2 normal, III/VI systolic murmur, no click, rub or gallop GI: Soft, non-tender; bowel sounds normal; no  organomegaly. Extremities: No edema, cyanosis or clubbing. Skin: Warm and dry.  Neurologic: Alert and oriented X 3, normal strength. Gait not checked but uses walker at home.  Assessment/Plan Chest pain MI ruled out Aortic stenosis LBBB Hypertension Chronic iron deficiency anemia Chronic GI loss anemia Arthritis Chronic shortness of breath Gait abnormality  Medical treatment only. Patient can not tolerate dual antiplatelet therapy. Follow up in 1 week. Patient made aware.  Birdie Riddle, MD  11/28/2016, 1:50 PM

## 2016-11-28 NOTE — Discharge Summary (Signed)
Physician Discharge Summary  [name] Mura WUJ:[id number] DOB: 03/12/1923 DOA: 11/27/2016  PCP: Fleet ContrasAvbuere, Edwin, MD  Admit date: 11/27/2016 Discharge date: 11/28/2016   Recommendations for Outpatient Follow-Up:   1. Follow up with cardiology 1 week 2. Information about hiatal hernia given   Discharge Diagnosis:   Principal Problem:   Chest pain, rule out acute myocardial infarction Active Problems:   Hypertension   Hiatal hernia   Discharge disposition:  Home  Discharge Condition: Improved.  Diet recommendation: Low sodium, heart healthy  Wound care: None.   History of Present Illness:   [name] Gang is a 83 y.o. female with medical history significant of HTN.  Patient developed CP this morning.  Was relieved with NTG.  This evening developed neck pain with radiation to L shoulder and arm, this also was improved with NTG.  Called for ambulance, they gave ASA, CP resolved completely.  No associated nausea, dyspnea.   Hospital Course by Problem:   Chest pain with new LBBB -seen by her cardiologist -medical treatment only -suspect related to hiatal hernaia   Medical Consultants:    cards   Discharge Exam:   Vitals:   11/28/16 1207 11/28/16 1649  BP: 113/60 (!) 134/58  Pulse: (!) 52 61  Resp:    Temp:    SpO2: 100% 100%   Vitals:   11/28/16 0040 11/28/16 0500 11/28/16 1207 11/28/16 1649  BP: (!) 153/64 (!) 105/50 113/60 (!) 134/58  Pulse: 61 (!) 57 (!) 52 61  Resp: 16 18    Temp: 97.7 F (36.5 [name]) 97.6 F (36.4 [name])    TempSrc: Oral Oral    SpO2: 100% 100% 100% 100%  Weight: 90.7 kg (199 lb 14.4 oz)     Height: 5\' 3"  (1.6 m)       Gen:  NAD   The results of significant diagnostics from this hospitalization (including imaging, microbiology, ancillary and laboratory) are listed below for reference.     Procedures and Diagnostic Studies:   Dg Chest 2 View  Result Date: 11/27/2016 CLINICAL DATA:  Left chest pain extending to the left arm  and left neck since our on 8 o'clock this morning. History of aortic stenosis, hypertension, shortness of breath, CHF, previous smoker. EXAM: CHEST  2 VIEW COMPARISON:  CT chest 11/15/2012.  Chest 11/15/2012. FINDINGS: Shallow inspiration. Mild cardiac enlargement. No pulmonary vascular congestion. No edema or consolidation. No blunting of costophrenic angles. No pneumothorax. Calcified and tortuous aorta. Degenerative changes in the spine and shoulders. IMPRESSION: Cardiac enlargement. No evidence of active pulmonary disease. Aortic atherosclerosis. Electronically Signed   By: Burman NievesWilliam  Stevens M.D.   On: 11/27/2016 22:14     Labs:   Basic Metabolic Panel:  Recent Labs Lab 11/27/16 2132  NA 137  K 4.1  CL 104  CO2 26  GLUCOSE 98  BUN 34*  CREATININE 1.58*  CALCIUM 9.0   GFR Estimated Creatinine Clearance: 24.8 mL/min (A) (by [name]-G formula based on SCr of 1.58 mg/dL (H)). Liver Function Tests: No results for input(s): AST, ALT, ALKPHOS, BILITOT, PROT, ALBUMIN in the last 168 hours. No results for input(s): LIPASE, AMYLASE in the last 168 hours. No results for input(s): AMMONIA in the last 168 hours. Coagulation profile No results for input(s): INR, PROTIME in the last 168 hours.  CBC:  Recent Labs Lab 11/27/16 2132  WBC 4.4  HGB 10.2*  HCT 31.4*  MCV 92.9  PLT 262   Cardiac Enzymes:  Recent Labs Lab 11/27/16 2355  11/28/16 0218 11/28/16 0513  TROPONINI <0.03 <0.03 <0.03   BNP: Invalid input(s): POCBNP CBG: No results for input(s): GLUCAP in the last 168 hours. D-Dimer No results for input(s): DDIMER in the last 72 hours. Hgb A1c No results for input(s): HGBA1C in the last 72 hours. Lipid Profile No results for input(s): CHOL, HDL, LDLCALC, TRIG, CHOLHDL, LDLDIRECT in the last 72 hours. Thyroid function studies No results for input(s): TSH, T4TOTAL, T3FREE, THYROIDAB in the last 72 hours.  Invalid input(s): FREET3 Anemia work up No results for input(s):  VITAMINB12, FOLATE, FERRITIN, TIBC, IRON, RETICCTPCT in the last 72 hours. Microbiology No results found for this or any previous visit (from the past 240 hour(s)).   Discharge Instructions:   Discharge Instructions    Diet - low sodium heart healthy    Complete by:  As directed    Increase activity slowly    Complete by:  As directed      Allergies as of 11/28/2016      Reactions   Ezetimibe Anaphylaxis   Lipitor [atorvastatin] Swelling   Pravachol [pravastatin] Swelling   Statins Swelling, Other (See Comments)   Swelling of mouth and lips   Zocor [simvastatin] Swelling   Cholestyramine Nausea And Vomiting      Medication List    TAKE these medications   acetaminophen 325 MG tablet Commonly known as:  TYLENOL Take 325 mg by mouth every 6 (six) hours as needed. pain   aspirin EC 81 MG tablet Take 81 mg by mouth daily.   esomeprazole 40 MG capsule Commonly known as:  NEXIUM Take 40 mg by mouth daily before breakfast.   ferrous sulfate 325 (65 FE) MG tablet Take 325 mg by mouth daily with breakfast.   losartan-hydrochlorothiazide 100-12.5 MG tablet Commonly known as:  HYZAAR Take 1 tablet by mouth daily.   metoprolol tartrate 25 MG tablet Commonly known as:  LOPRESSOR Take 25 mg by mouth 2 (two) times daily.   multivitamin with minerals Tabs tablet Take 1 tablet by mouth daily.   nitroGLYCERIN 0.4 MG SL tablet Commonly known as:  NITROSTAT Place 0.4 mg under the tongue every 5 (five) minutes as needed for chest pain.   polyethylene glycol powder powder Commonly known as:  GLYCOLAX/MIRALAX Take 17 g by mouth daily as needed for mild constipation.   Vitamin D3 2000 units Tabs Take 2,000 Units by mouth daily.      Follow-up Information    Fleet Contras, MD In 1 week.   Specialty:  Internal Medicine Why:  Left message Contact information: [address] [place] [address] [phone]        Orpah Cobb, MD. Go on 12/10/2016.     Specialty:  Cardiology Why:  @2 :30pm Contact information: [address] [place] [address] [phone]            Time coordinating discharge: 25 min  Signed:  Rachna Schonberger U Talley Casco   [facility] 11/28/2016, 4:52 PM

## 2016-11-28 NOTE — Progress Notes (Addendum)
Reviewed all discharge instructions and patient stated understanding.  No voiced complaints.  Patient called daughter to give her a ride home. Patient confirmed she has cell phone, pocketbook, clothing, and bedroom shoes, and upper and lower dentures with her to take home.   Patient also taking home her cane she brought to hospital.

## 2016-12-13 ENCOUNTER — Ambulatory Visit (INDEPENDENT_AMBULATORY_CARE_PROVIDER_SITE_OTHER): Payer: Medicare Other

## 2016-12-13 ENCOUNTER — Ambulatory Visit (INDEPENDENT_AMBULATORY_CARE_PROVIDER_SITE_OTHER): Payer: Medicare Other | Admitting: Orthopaedic Surgery

## 2016-12-13 DIAGNOSIS — M25551 Pain in right hip: Secondary | ICD-10-CM

## 2016-12-13 DIAGNOSIS — Z96642 Presence of left artificial hip joint: Secondary | ICD-10-CM

## 2016-12-13 DIAGNOSIS — G8929 Other chronic pain: Secondary | ICD-10-CM

## 2016-12-13 DIAGNOSIS — M25561 Pain in right knee: Secondary | ICD-10-CM

## 2016-12-13 DIAGNOSIS — M1711 Unilateral primary osteoarthritis, right knee: Secondary | ICD-10-CM | POA: Diagnosis not present

## 2016-12-13 MED ORDER — LIDOCAINE HCL 1 % IJ SOLN
3.0000 mL | INTRAMUSCULAR | Status: AC | PRN
  Administered 2016-12-13: 3 mL

## 2016-12-13 MED ORDER — METHYLPREDNISOLONE ACETATE 40 MG/ML IJ SUSP
40.0000 mg | INTRAMUSCULAR | Status: AC | PRN
  Administered 2016-12-13: 40 mg via INTRA_ARTICULAR

## 2016-12-13 NOTE — Progress Notes (Signed)
Office Visit Note   Patient: [name]           Date of Birth: 02/24/1923           MRN: [id number] Visit Date: 12/13/2016              Requested by: Fleet Contras, MD [address] [place], [place] [address] PCP: Fleet Contras, MD   Assessment & Plan: Visit Diagnoses:  1. Chronic pain of right knee   2. History of left hip replacement   3. Status post total replacement of left hip   4. Unilateral primary osteoarthritis, right knee     Plan: Her arthritis is so severe in her right knee but there is no much we can do other than a steroid injection from time to time or consider knee replacement surgery.  I do feel that she benefit from a trochanteric injection on  her left hip area.  She did wish for injection on both sides because she is understanding surgery.  She tolerated a right knee injection in her left hip trochanteric injection.  We will see her back as needed.  She knows to wait at least 3 months between injections.  Follow-Up Instructions: Return if symptoms worsen or fail to improve.   Orders:  Orders Placed This Encounter  Procedures  . Large Joint Injection/Arthrocentesis  . XR HIP UNILAT W OR W/O PELVIS 1V LEFT  . XR Knee 1-2 Views Right   No orders of the defined types were placed in this encounter.     Procedures: Large Joint Inj Date/Time: 12/13/2016 3:20 PM Performed by: Kathryne Hitch Authorized by: Kathryne Hitch   Location:  Knee Site:  R knee Ultrasound Guidance: No   Fluoroscopic Guidance: No   Arthrogram: No   Medications:  3 mL lidocaine 1 %; 40 mg methylPREDNISolone acetate 40 MG/ML     Clinical Data: No additional findings.   Subjective: No chief complaint on file. The patient is a 83 year old who is about 17 years out from a left total knee arthroplasty and over a year and a half out from a left total hip arthroplasty.  She says her left hip hurts but she points to trochanteric area as a source for pain  and that her right knee is hurting quite a bit.  TMs with a cane.  Both these areas hurt significantly.  They have detrimentally affecting her activities daily living, quality of life, and her mobility.  HPI  Review of Systems She currently denies any headache, chest pain, shortness of breath, fever, chills, nausea, vomiting.  Objective: Vital Signs: There were no vitals taken for this visit.  Physical Exam She is alert and oriented x3 and in no acute distress Ortho Exam Examination of her left hip shows full range of motion of the hip.  Her incision is well-healed.  She has significant tenderness of the trochanteric area and the iliotibial band.  Her left knee exam is normal.  The right knee shows severe pain with flexion extension of the knee and severe crepitation.  She has a mild effusion.  She has significant medial joint tenderness and significant bone swelling. Specialty Comments:  No specialty comments available.  Imaging: Xr Hip Unilat W Or W/o Pelvis 1v Left  Result Date: 12/13/2016 An AP pelvis and lateral of her left operative hip is a well-seated total hip arthroplasty with no complicating features.  There is no acute findings.  Xr Knee 1-2 Views Right  Result Date:  12/13/2016 2 views of the right knee show severe end-stage arthritis of the right knee with joint space narrowing in all 3 compartments with complete bone-on-bone wear.  There is severe osteophytes throughout the knee.  This is the worst arthritis is seen in a long time.    PMFS History: Patient Active Problem List   Diagnosis Date Noted  . Hiatal hernia 11/28/2016  . Chest pain, rule out acute myocardial infarction 11/27/2016  . Hyperlipidemia 05/24/2015  . Aortic stenosis 05/24/2015  . GERD without esophagitis 05/23/2015  . Postoperative anemia due to acute blood loss 05/23/2015  . Osteoarthritis of left hip 05/17/2015  . 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 GI bleed 02/21/2015  .  Acute bronchitis 11/16/2012  . Hypertension 11/13/2012  . Bright red blood per rectum 11/13/2012  . Lower GI bleed 02/23/2012  . History of GI diverticular bleed 02/23/2012  . Diverticulosis 02/23/2012   Past Medical History:  Diagnosis Date  . Acid reflux    takes Nexium daily  . Anemia    takes Ferrous Sulfate daily  . Aortic stenosis    mild AS pk grad 18, mean grad 9, AVA 1.32 cm 03/2015 echo (Dr. Jacinto HalimGanji)  . Arthritis   . Diverticulitis   . Hematochezia 03/2015  . High cholesterol    can't take the meds  . History of blood transfusion 03/2015   no abnormal reaction noted  . History of GI diverticular bleed   . HTN (hypertension)    takes Losartan-HCTZ and Metoprolol daily  . Joint pain   . Joint swelling   . Nocturia   . Numbness    in legs  . Peripheral edema    occasionally  . Shortness of breath dyspnea    occasionally and with exertion  . Slow urinary stream    at times    Family History  Problem Relation Age of Onset  . Diabetes Other   . Hypertension Other   . Cancer Other   . Heart disease Other     Past Surgical History:  Procedure Laterality Date  . ABCESS DRAINAGE     abdominal abcess  . cataract surgery Bilateral   . CHOLECYSTECTOMY    . DILATION AND CURETTAGE OF UTERUS    . ESOPHAGOGASTRODUODENOSCOPY N/A 02/23/2015   Procedure: ESOPHAGOGASTRODUODENOSCOPY (EGD);  Surgeon: Carman ChingJames Edwards, MD;  [facility];  Service: Endoscopy;  Laterality: N/A;  . FLEXIBLE SIGMOIDOSCOPY N/A 02/24/2015   Procedure: FLEXIBLE SIGMOIDOSCOPY;  Surgeon: Carman ChingJames Edwards, MD;  [facility];  Service: Endoscopy;  Laterality: N/A;  . left knee surgery    . TOTAL HIP ARTHROPLASTY Left 05/17/2015   Procedure: LEFT TOTAL HIP ARTHROPLASTY ANTERIOR APPROACH;  Surgeon: Kathryne Hitchhristopher Y Darnita Woodrum, MD;  Location: [facility] OR;  Service: Orthopedics;  Laterality: Left;   Social History   Occupational History  . Not on file.   Social History Main Topics  . Smoking status:  Former Games developermoker  . Smokeless tobacco: Never Used     Comment: quit smoking 8+ yrs ago  . Alcohol use No     Comment: quit yrs ago  . Drug use: No  . Sexual activity: Not Currently

## 2017-02-06 ENCOUNTER — Ambulatory Visit (HOSPITAL_COMMUNITY)
Admission: RE | Admit: 2017-02-06 | Discharge: 2017-02-06 | Disposition: A | Discharge status: Home / Self Care | Payer: Medicare Other | Source: Ambulatory Visit | Attending: Internal Medicine | Admitting: Internal Medicine

## 2017-02-06 ENCOUNTER — Other Ambulatory Visit (HOSPITAL_COMMUNITY): Payer: Self-pay | Admitting: Internal Medicine

## 2017-02-06 DIAGNOSIS — I10 Essential (primary) hypertension: Secondary | ICD-10-CM | POA: Insufficient documentation

## 2017-02-06 DIAGNOSIS — R9389 Abnormal findings on diagnostic imaging of other specified body structures: Secondary | ICD-10-CM | POA: Insufficient documentation

## 2017-02-06 DIAGNOSIS — E785 Hyperlipidemia, unspecified: Secondary | ICD-10-CM | POA: Diagnosis not present

## 2017-02-06 DIAGNOSIS — I739 Peripheral vascular disease, unspecified: Secondary | ICD-10-CM

## 2017-02-06 DIAGNOSIS — Z87891 Personal history of nicotine dependence: Secondary | ICD-10-CM | POA: Insufficient documentation

## 2017-03-06 ENCOUNTER — Inpatient Hospital Stay (HOSPITAL_COMMUNITY): Payer: Medicare Other

## 2017-03-06 ENCOUNTER — Encounter (HOSPITAL_COMMUNITY): Payer: Self-pay | Admitting: Emergency Medicine

## 2017-03-06 ENCOUNTER — Inpatient Hospital Stay (HOSPITAL_COMMUNITY)
Admission: EM | Admit: 2017-03-06 | Discharge: 2017-03-12 | DRG: 357 | Disposition: A | Discharge status: Home / Self Care | Payer: Medicare Other | Attending: Family Medicine | Admitting: Family Medicine

## 2017-03-06 ENCOUNTER — Other Ambulatory Visit: Payer: Self-pay

## 2017-03-06 DIAGNOSIS — Z87891 Personal history of nicotine dependence: Secondary | ICD-10-CM | POA: Diagnosis not present

## 2017-03-06 DIAGNOSIS — I129 Hypertensive chronic kidney disease with stage 1 through stage 4 chronic kidney disease, or unspecified chronic kidney disease: Secondary | ICD-10-CM | POA: Diagnosis present

## 2017-03-06 DIAGNOSIS — N179 Acute kidney failure, unspecified: Secondary | ICD-10-CM | POA: Diagnosis present

## 2017-03-06 DIAGNOSIS — Z96642 Presence of left artificial hip joint: Secondary | ICD-10-CM | POA: Diagnosis present

## 2017-03-06 DIAGNOSIS — K5730 Diverticulosis of large intestine without perforation or abscess without bleeding: Secondary | ICD-10-CM | POA: Diagnosis not present

## 2017-03-06 DIAGNOSIS — E7800 Pure hypercholesterolemia, unspecified: Secondary | ICD-10-CM | POA: Diagnosis present

## 2017-03-06 DIAGNOSIS — K5731 Diverticulosis of large intestine without perforation or abscess with bleeding: Secondary | ICD-10-CM | POA: Diagnosis not present

## 2017-03-06 DIAGNOSIS — D696 Thrombocytopenia, unspecified: Secondary | ICD-10-CM | POA: Diagnosis present

## 2017-03-06 DIAGNOSIS — Z7982 Long term (current) use of aspirin: Secondary | ICD-10-CM | POA: Diagnosis not present

## 2017-03-06 DIAGNOSIS — M1990 Unspecified osteoarthritis, unspecified site: Secondary | ICD-10-CM | POA: Diagnosis present

## 2017-03-06 DIAGNOSIS — K922 Gastrointestinal hemorrhage, unspecified: Secondary | ICD-10-CM

## 2017-03-06 DIAGNOSIS — R600 Localized edema: Secondary | ICD-10-CM | POA: Diagnosis present

## 2017-03-06 DIAGNOSIS — Z888 Allergy status to other drugs, medicaments and biological substances status: Secondary | ICD-10-CM | POA: Diagnosis not present

## 2017-03-06 DIAGNOSIS — Z8249 Family history of ischemic heart disease and other diseases of the circulatory system: Secondary | ICD-10-CM | POA: Diagnosis not present

## 2017-03-06 DIAGNOSIS — K635 Polyp of colon: Secondary | ICD-10-CM | POA: Diagnosis present

## 2017-03-06 DIAGNOSIS — T80818A Extravasation of other vesicant agent, initial encounter: Secondary | ICD-10-CM | POA: Diagnosis present

## 2017-03-06 DIAGNOSIS — K449 Diaphragmatic hernia without obstruction or gangrene: Secondary | ICD-10-CM | POA: Diagnosis present

## 2017-03-06 DIAGNOSIS — D62 Acute posthemorrhagic anemia: Secondary | ICD-10-CM

## 2017-03-06 DIAGNOSIS — Z9049 Acquired absence of other specified parts of digestive tract: Secondary | ICD-10-CM | POA: Diagnosis not present

## 2017-03-06 DIAGNOSIS — N183 Chronic kidney disease, stage 3 (moderate): Secondary | ICD-10-CM | POA: Diagnosis present

## 2017-03-06 DIAGNOSIS — I350 Nonrheumatic aortic (valve) stenosis: Secondary | ICD-10-CM | POA: Diagnosis present

## 2017-03-06 DIAGNOSIS — I10 Essential (primary) hypertension: Secondary | ICD-10-CM | POA: Diagnosis present

## 2017-03-06 DIAGNOSIS — Z833 Family history of diabetes mellitus: Secondary | ICD-10-CM | POA: Diagnosis not present

## 2017-03-06 DIAGNOSIS — N912 Amenorrhea, unspecified: Secondary | ICD-10-CM | POA: Diagnosis present

## 2017-03-06 DIAGNOSIS — K219 Gastro-esophageal reflux disease without esophagitis: Secondary | ICD-10-CM | POA: Diagnosis present

## 2017-03-06 HISTORY — DX: Chronic kidney disease, stage 3 (moderate): N18.3

## 2017-03-06 HISTORY — PX: IR EMBO ART  VEN HEMORR LYMPH EXTRAV  INC GUIDE ROADMAPPING: IMG5450

## 2017-03-06 HISTORY — PX: IR ANGIOGRAM VISCERAL SELECTIVE: IMG657

## 2017-03-06 HISTORY — DX: Chronic kidney disease, stage 3 unspecified: N18.30

## 2017-03-06 HISTORY — PX: IR US GUIDE VASC ACCESS RIGHT: IMG2390

## 2017-03-06 HISTORY — DX: Acute posthemorrhagic anemia: D62

## 2017-03-06 HISTORY — PX: IR ANGIOGRAM SELECTIVE EACH ADDITIONAL VESSEL: IMG667

## 2017-03-06 HISTORY — DX: Gastrointestinal hemorrhage, unspecified: K92.2

## 2017-03-06 LAB — MRSA PCR SCREENING: MRSA by PCR: NEGATIVE

## 2017-03-06 LAB — CBC WITH DIFFERENTIAL/PLATELET
Basophils Absolute: 0 10*3/uL (ref 0.0–0.1)
Basophils Relative: 0 %
Eosinophils Absolute: 0.1 10*3/uL (ref 0.0–0.7)
Eosinophils Relative: 3 %
HCT: 26.5 % — ABNORMAL LOW (ref 36.0–46.0)
Hemoglobin: 8.7 g/dL — ABNORMAL LOW (ref 12.0–15.0)
Lymphocytes Relative: 21 %
Lymphs Abs: 0.9 10*3/uL (ref 0.7–4.0)
MCH: 30.4 pg (ref 26.0–34.0)
MCHC: 32.8 g/dL (ref 30.0–36.0)
MCV: 92.7 fL (ref 78.0–100.0)
Monocytes Absolute: 0.4 10*3/uL (ref 0.1–1.0)
Monocytes Relative: 9 %
Neutro Abs: 3 10*3/uL (ref 1.7–7.7)
Neutrophils Relative %: 67 %
Platelets: 243 10*3/uL (ref 150–400)
RBC: 2.86 MIL/uL — ABNORMAL LOW (ref 3.87–5.11)
RDW: 15 % (ref 11.5–15.5)
WBC: 4.5 10*3/uL (ref 4.0–10.5)

## 2017-03-06 LAB — COMPREHENSIVE METABOLIC PANEL
ALT: 9 U/L — ABNORMAL LOW (ref 14–54)
AST: 19 U/L (ref 15–41)
Albumin: 3 g/dL — ABNORMAL LOW (ref 3.5–5.0)
Alkaline Phosphatase: 43 U/L (ref 38–126)
Anion gap: 10 (ref 5–15)
BUN: 27 mg/dL — ABNORMAL HIGH (ref 6–20)
CO2: 24 mmol/L (ref 22–32)
Calcium: 8.9 mg/dL (ref 8.9–10.3)
Chloride: 106 mmol/L (ref 101–111)
Creatinine, Ser: 1.41 mg/dL — ABNORMAL HIGH (ref 0.44–1.00)
GFR calc Af Amer: 37 mL/min — ABNORMAL LOW (ref >60)
GFR calc non Af Amer: 32 mL/min — ABNORMAL LOW (ref >60)
Glucose, Bld: 117 mg/dL — ABNORMAL HIGH (ref 65–99)
Potassium: 4 mmol/L (ref 3.5–5.1)
Sodium: 140 mmol/L (ref 135–145)
Total Bilirubin: 0.4 mg/dL (ref 0.3–1.2)
Total Protein: 5.6 g/dL — ABNORMAL LOW (ref 6.5–8.1)

## 2017-03-06 LAB — PREPARE RBC (CROSSMATCH)

## 2017-03-06 LAB — POC OCCULT BLOOD, ED: Fecal Occult Bld: POSITIVE — AB

## 2017-03-06 MED ORDER — TECHNETIUM TC 99M-LABELED RED BLOOD CELLS IV KIT
25.4000 | PACK | Freq: Once | INTRAVENOUS | Status: AC | PRN
  Administered 2017-03-06: 25.4 via INTRAVENOUS

## 2017-03-06 MED ORDER — SODIUM CHLORIDE 0.9 % IV SOLN: Freq: Once | INTRAVENOUS | Status: DC

## 2017-03-06 MED ORDER — ONDANSETRON HCL 4 MG/2ML IJ SOLN: 4.0000 mg | Freq: Four times a day (QID) | INTRAMUSCULAR | Status: DC | PRN

## 2017-03-06 MED ORDER — FENTANYL CITRATE (PF) 100 MCG/2ML IJ SOLN
INTRAMUSCULAR | Status: AC
  Filled 2017-03-06: qty 2

## 2017-03-06 MED ORDER — LIDOCAINE HCL 1 % IJ SOLN
INTRAMUSCULAR | Status: AC | PRN
Start: 2017-03-06 — End: 2017-03-06
  Administered 2017-03-06: 5 mL

## 2017-03-06 MED ORDER — IOPAMIDOL (ISOVUE-300) INJECTION 61%
INTRAVENOUS | Status: AC
  Administered 2017-03-06: 60 mL
  Filled 2017-03-06: qty 150

## 2017-03-06 MED ORDER — MIDAZOLAM HCL 2 MG/2ML IJ SOLN
INTRAMUSCULAR | Status: AC
Start: 2017-03-06 — End: 2017-03-07
  Filled 2017-03-06: qty 2

## 2017-03-06 MED ORDER — IOPAMIDOL (ISOVUE-300) INJECTION 61%
INTRAVENOUS | Status: AC
Start: 2017-03-06 — End: 2017-03-07
  Filled 2017-03-06: qty 100

## 2017-03-06 MED ORDER — ONDANSETRON HCL 4 MG PO TABS: 4.0000 mg | ORAL_TABLET | Freq: Four times a day (QID) | ORAL | Status: DC | PRN

## 2017-03-06 MED ORDER — FENTANYL CITRATE (PF) 100 MCG/2ML IJ SOLN
INTRAMUSCULAR | Status: AC | PRN
  Administered 2017-03-06: 50 ug via INTRAVENOUS

## 2017-03-06 MED ORDER — ACETAMINOPHEN 325 MG PO TABS
650.0000 mg | ORAL_TABLET | Freq: Four times a day (QID) | ORAL | Status: DC | PRN
  Administered 2017-03-09 – 2017-03-12 (×3): 650 mg via ORAL
  Filled 2017-03-06 (×4): qty 2

## 2017-03-06 MED ORDER — ACETAMINOPHEN 650 MG RE SUPP: 650.0000 mg | Freq: Four times a day (QID) | RECTAL | Status: DC | PRN

## 2017-03-06 MED ORDER — LIDOCAINE HCL 1 % IJ SOLN
INTRAMUSCULAR | Status: AC
  Filled 2017-03-06: qty 20

## 2017-03-06 MED ORDER — MIDAZOLAM HCL 2 MG/2ML IJ SOLN
INTRAMUSCULAR | Status: AC | PRN
Start: 2017-03-06 — End: 2017-03-06
  Administered 2017-03-06: 0.5 mg via INTRAVENOUS
  Administered 2017-03-06: 1 mg via INTRAVENOUS

## 2017-03-06 MED ORDER — LIDOCAINE HCL 1 % IJ SOLN
INTRAMUSCULAR | Status: AC
Start: 2017-03-06 — End: 2017-03-07
  Filled 2017-03-06: qty 20

## 2017-03-06 MED ORDER — PANTOPRAZOLE SODIUM 40 MG IV SOLR
40.0000 mg | Freq: Once | INTRAVENOUS | Status: AC
  Administered 2017-03-06: 40 mg via INTRAVENOUS
  Filled 2017-03-06: qty 40

## 2017-03-06 MED ORDER — LIDOCAINE-EPINEPHRINE (PF) 2 %-1:200000 IJ SOLN
INTRAMUSCULAR | Status: AC | PRN
  Administered 2017-03-06: 10 mL

## 2017-03-06 MED ORDER — SODIUM CHLORIDE 0.9 % IV BOLUS (SEPSIS)
500.0000 mL | Freq: Once | INTRAVENOUS | Status: AC
  Administered 2017-03-06: 500 mL via INTRAVENOUS

## 2017-03-06 MED ORDER — IOPAMIDOL (ISOVUE-300) INJECTION 61%
INTRAVENOUS | Status: AC
  Administered 2017-03-06: 20 mL
  Filled 2017-03-06: qty 150

## 2017-03-06 MED ORDER — SODIUM CHLORIDE 0.9 % IV SOLN
INTRAVENOUS | Status: DC
  Administered 2017-03-06 – 2017-03-10 (×3): via INTRAVENOUS

## 2017-03-06 MED ORDER — SODIUM CHLORIDE 0.9 % IV SOLN
INTRAVENOUS | Status: AC | PRN
  Administered 2017-03-06: 200 mL via INTRAVENOUS

## 2017-03-06 NOTE — Sedation Documentation (Signed)
angioseal right groin

## 2017-03-06 NOTE — ED Notes (Signed)
Patient in IR

## 2017-03-06 NOTE — ED Provider Notes (Signed)
[facility] EMERGENCY DEPARTMENT Provider Note   CSN: [id number] Arrival date & time: 03/06/17  0755     History   Chief Complaint Chief Complaint  Patient presents with  . GI Bleeding    HPI [name] is a 84 y.o. female history of diverticulosis on previous colonoscopy, hypertension, aortic stenosis here presenting with bright red blood per rectum.  States that she started having lower abdominal cramps starting 11 PM.  Since then she had 4-5 episodes of bright red blood per rectum.  Patient denies any vomiting or fevers.  Patient states that she had a colonoscopy by Dr. Randa Evens before and has shown multiple polyps and diverticulosis. Patient on baby aspirin, no blood thinners or NSAIDs.   The history is provided by the patient.    Past Medical History:  Diagnosis Date  . Acid reflux    takes Nexium daily  . Anemia    takes Ferrous Sulfate daily  . Aortic stenosis    mild AS pk grad 18, mean grad 9, AVA 1.32 cm 03/2015 echo (Dr. Jacinto Halim)  . Arthritis   . Diverticulitis   . Hematochezia 03/2015  . High cholesterol    can't take the meds  . History of blood transfusion 03/2015   no abnormal reaction noted  . History of GI diverticular bleed   . HTN (hypertension)    takes Losartan-HCTZ and Metoprolol daily  . Joint pain   . Joint swelling   . Nocturia   . Numbness    in legs  . Peripheral edema    occasionally  . Shortness of breath dyspnea    occasionally and with exertion  . Slow urinary stream    at times    Patient Active Problem List   Diagnosis Date Noted  . Acute lower GI bleeding 03/06/2017  . Diverticula, colon 03/06/2017  . HTN (hypertension) 03/06/2017  . High cholesterol 03/06/2017  . Acute kidney injury (HCC) 03/06/2017  . Hiatal hernia 11/28/2016  . Chest pain, rule out acute myocardial infarction 11/27/2016  . Hyperlipidemia 05/24/2015  . Aortic stenosis 05/24/2015  . GERD without esophagitis 05/23/2015  . Postoperative  anemia due to acute blood loss 05/23/2015  . Osteoarthritis of left hip 05/17/2015  . 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 GI bleed 02/21/2015  . Acute bronchitis 11/16/2012  . Hypertension 11/13/2012  . Bright red blood per rectum 11/13/2012  . Lower GI bleed 02/23/2012  . History of GI diverticular bleed 02/23/2012  . Diverticulosis 02/23/2012    Past Surgical History:  Procedure Laterality Date  . ABCESS DRAINAGE     abdominal abcess  . cataract surgery Bilateral   . CHOLECYSTECTOMY    . DILATION AND CURETTAGE OF UTERUS    . ESOPHAGOGASTRODUODENOSCOPY N/A 02/23/2015   Procedure: ESOPHAGOGASTRODUODENOSCOPY (EGD);  Surgeon: Carman Ching, MD;  Location: [facility] ENDOSCOPY;  Service: Endoscopy;  Laterality: N/A;  . FLEXIBLE SIGMOIDOSCOPY N/A 02/24/2015   Procedure: FLEXIBLE SIGMOIDOSCOPY;  Surgeon: Carman Ching, MD;  Location: [facility] ENDOSCOPY;  Service: Endoscopy;  Laterality: N/A;  . left knee surgery    . TOTAL HIP ARTHROPLASTY Left 05/17/2015   Procedure: LEFT TOTAL HIP ARTHROPLASTY ANTERIOR APPROACH;  Surgeon: Kathryne Hitch, MD;  Location: [facility] OR;  Service: Orthopedics;  Laterality: Left;    OB History    No data available       Home Medications    Prior to Admission medications   Medication Sig Start Date End Date Taking?  Authorizing Provider  acetaminophen (TYLENOL) 325 MG tablet Take 325 mg by mouth every 6 (six) hours as needed. pain    [provider]  aspirin EC 81 MG tablet Take 81 mg by mouth daily.    [provider]  Cholecalciferol (VITAMIN D3) 2000 units TABS Take 2,000 Units by mouth daily.    [provider]  esomeprazole (NEXIUM) 40 MG capsule Take 40 mg by mouth daily before breakfast.    [provider]  ferrous sulfate 325 (65 FE) MG tablet Take 325 mg by mouth daily with breakfast.    [provider]  losartan-hydrochlorothiazide (HYZAAR) 100-12.5 MG tablet Take 1 tablet by mouth daily.     [provider]  metoprolol tartrate (LOPRESSOR) 25 MG tablet Take 25 mg by mouth 2 (two) times daily.    [provider]  Multiple Vitamin (MULITIVITAMIN WITH MINERALS) TABS Take 1 tablet by mouth daily.    [provider]  nitroGLYCERIN (NITROSTAT) 0.4 MG SL tablet Place 0.4 mg under the tongue every 5 (five) minutes as needed for chest pain.     [provider]  polyethylene glycol powder (GLYCOLAX/MIRALAX) powder Take 17 g by mouth daily as needed for mild constipation.    [provider]    Family History Family History  Problem Relation Age of Onset  . Diabetes Other   . Hypertension Other   . Cancer Other   . Heart disease Other     Social History Social History   Tobacco Use  . Smoking status: Former Games developer  . Smokeless tobacco: Never Used  . Tobacco comment: quit smoking 8+ yrs ago  Substance Use Topics  . Alcohol use: No    Comment: quit yrs ago  . Drug use: No     Allergies   Ezetimibe; Lipitor [atorvastatin]; Pravachol [pravastatin]; Statins; Zocor [simvastatin]; and Cholestyramine   Review of Systems Review of Systems  Gastrointestinal: Positive for blood in stool.  All other systems reviewed and are negative.    Physical Exam Updated Vital Signs BP 125/68 (BP Location: Left Arm)   Pulse (!) 56   Temp 97.7 F (36.5 C) (Oral)   Resp 18   SpO2 100%   Physical Exam  Constitutional: She appears well-developed.  HENT:  Head: Normocephalic.  Eyes: Conjunctivae and EOM are normal. Pupils are equal, round, and reactive to light.  Neck: Normal range of motion. Neck supple.  Cardiovascular: Normal rate, regular rhythm and normal heart sounds.  Pulmonary/Chest: Effort normal and breath sounds normal. No stridor. No respiratory distress.  Abdominal: Soft. Bowel sounds are normal. She exhibits no distension. There is no tenderness. There is no guarding.  Genitourinary:  Genitourinary Comments: Rectal- bloody  stool, no obvious hemorrhoids   Musculoskeletal: Normal range of motion.  Neurological: She is alert.  Skin: Skin is warm.  Psychiatric: She has a normal mood and affect.  Nursing note and vitals reviewed.    ED Treatments / Results  Labs (all labs ordered are listed, but only abnormal results are displayed) Labs Reviewed  COMPREHENSIVE METABOLIC PANEL - Abnormal; Notable for the following components:      Result Value   Glucose, Bld 117 (*)    BUN 27 (*)    Creatinine, Ser 1.41 (*)    Total Protein 5.6 (*)    Albumin 3.0 (*)    ALT 9 (*)    GFR calc non Af Amer 32 (*)    GFR calc Af Amer 37 (*)  All other components within normal limits  CBC WITH DIFFERENTIAL/PLATELET - Abnormal; Notable for the following components:   RBC 2.86 (*)    Hemoglobin 8.7 (*)    HCT 26.5 (*)    All other components within normal limits  POC OCCULT BLOOD, ED - Abnormal; Notable for the following components:   Fecal Occult Bld POSITIVE (*)    All other components within normal limits  TYPE AND SCREEN    EKG  EKG Interpretation None       Radiology No results found.  Procedures Procedures (including critical care time)  Medications Ordered in ED Medications  sodium chloride 0.9 % bolus 500 mL (500 mLs Intravenous New Bag/Given 03/06/17 0927)  pantoprazole (PROTONIX) injection 40 mg (40 mg Intravenous Given 03/06/17 [id number])     Initial Impression / Assessment and Plan / ED Course  I have reviewed the triage vital signs and the nursing notes.  Pertinent labs & imaging results that were available during my care of the patient were reviewed by me and considered in my medical decision making (see chart for details).     Wilder GladeBertha C Marquart is a 84 y.o. female here with blood in stool, lower abdominal pain. Likely diverticulosis. Will get labs, guiac. Will consult [facility] and likely need admission for lower GI bleed from diverticulosis.   10:29 AM Hg dropped to 8.7. BP low 100s to 90s.  Not orthostatic. Given 500 cc bolus, protonix. I called [facility] Merv[facility]. Hospitalist to admit for lower GI bleed.   Final Clinical Impressions(s) / ED Diagnoses   Final diagnoses:  None    ED Discharge Orders    None       Charlynne PanderYao, Javi Bollman Hsienta, MD 03/06/17 1032

## 2017-03-06 NOTE — Procedures (Signed)
Interventional Radiology Procedure Note  Procedure: Visceral angiogram with coil embolization of bleeding branch of the left colic artery.   Vascular Access: Right CFA, 29F --> AngioSeal  Complications: None  Estimated Blood Loss: None  Recommendations: - Bedrest x 4 hrs - Clears only tonight - Trend Cr, watch for CIN (peak risk at 48 hrs) - Follow H&H  Signed,  Sterling BigHeath K. Syre Knerr, MD

## 2017-03-06 NOTE — H&P (Signed)
History and Physical    [name]:[id number] DOB: 1923/04/14 DOA: 03/06/2017  **Will admit patient based on the expectation that the patient will need hospitalization/ hospital care that crosses at least 2 midnights  PCP: Fleet Contras, MD   Attending physician: Luberta Robertson  Patient coming from/Resides with: Private residence  Chief Complaint: Abdominal cramping and bright red blood per rectum  HPI: [name] is a 84 y.o. female with medical history significant for multiple GI bleeds (patient reports 5 episodes) with most recent about 1 year ago.  She underwent a virtual colonoscopy in 2017 that revealed polyps and diverticular disease.  Patient also has underlying hypertension, edema, arthritis and chronic anemia on iron replacement.  She also has a history of chronic peripheral edema.  Patient reports she was in her usual state of health until about 11 PM last night on 1/22 when she developed crampy lower abdominal pain and then began having dark tarry stools.  She states the stools are somewhat different than her typical dark stools from her iron tablets.  The pain intensified and she subsequently presented to the ER for treatment.  While in triage patient had a large bright red blood stool and has subsequently had another bright red blood stool while in the treatment area.  She is not hypotensive but is now complaining of dizziness with standing.  Hemoglobin is down to 8.7 from the previous reading in October 2018 was 10.2.  Patient will be admitted with lower GI bleeding likely related to diverticular disease.  ED Course:  Vital Signs: BP 125/68 (BP Location: Left Arm)   Pulse (!) 56   Temp 97.7 F (36.5 C) (Oral)   Resp 18   SpO2 100%  Lab data: Sodium 140, potassium 4.0, chloride 106, CO2 24, glucose 117, BUN 27, creatinine 1.41, anion gap 10, albumin 3.0, LFTs not elevated, white count 4500 with normal differential, hemoglobin 8.7, hematocrit 26.5, platelets 243,000  FOB positive Medications and treatments: Normal saline bolus times 500 cc, Protonix 40 mg IV x1  Review of Systems:  In addition to the HPI above,  No Fever-chills, myalgias or other constitutional symptoms No Headache, changes with Vision or hearing, new weakness, tingling, numbness in any extremity, dysarthria or word finding difficulty, gait disturbance or imbalance, tremors or seizure activity No problems swallowing food or Liquids, indigestion/reflux, choking or coughing while eating, abdominal pain with or after eating No Chest pain, Cough or Shortness of Breath, palpitations, orthopnea or DOE No N/V, constipation No dysuria, malodorous urine, hematuria or flank pain No new skin rashes, lesions, masses or bruises, No new joint pains, aches, swelling or redness No recent unintentional weight gain or loss No polyuria, polydypsia or polyphagia   Past Medical History:  Diagnosis Date  . Acid reflux    takes Nexium daily  . Anemia    takes Ferrous Sulfate daily  . Aortic stenosis    mild AS pk grad 18, mean grad 9, AVA 1.32 cm 03/2015 echo (Dr. Jacinto Halim)  . Arthritis   . Diverticulitis   . Hematochezia 03/2015  . High cholesterol    can't take the meds  . History of blood transfusion 03/2015   no abnormal reaction noted  . History of GI diverticular bleed   . HTN (hypertension)    takes Losartan-HCTZ and Metoprolol daily  . Joint pain   . Joint swelling   . Nocturia   . Numbness    in legs  . Peripheral edema  occasionally  . Shortness of breath dyspnea    occasionally and with exertion  . Slow urinary stream    at times    Past Surgical History:  Procedure Laterality Date  . ABCESS DRAINAGE     abdominal abcess  . cataract surgery Bilateral   . CHOLECYSTECTOMY    . DILATION AND CURETTAGE OF UTERUS    . ESOPHAGOGASTRODUODENOSCOPY N/A 02/23/2015   Procedure: ESOPHAGOGASTRODUODENOSCOPY (EGD);  Surgeon: Carman Ching, MD;  Location: [facility] ENDOSCOPY;  Service: Endoscopy;   Laterality: N/A;  . FLEXIBLE SIGMOIDOSCOPY N/A 02/24/2015   Procedure: FLEXIBLE SIGMOIDOSCOPY;  Surgeon: Carman Ching, MD;  Location[facility] ENDOSCOPY;  Service: Endoscopy;  Laterality: N/A;  . left knee surgery    . TOTAL HIP ARTHROPLASTY Left 05/17/2015   Procedure: LEFT TOTAL HIP ARTHROPLASTY ANTERIOR APPROACH;  Surgeon: Kathryne Hitch, MD;  Location: [facility] OR;  Service: Orthopedics;  Laterality: Left;    Social History   Socioeconomic History  . Marital status: Widowed    Spouse name: Not on file  . Number of children: Not on file  . Years of education: Not on file  . Highest education level: Not on file  Social Needs  . Financial resource strain: Not on file  . Food insecurity - worry: Not on file  . Food insecurity - inability: Not on file  . Transportation needs - medical: Not on file  . Transportation needs - non-medical: Not on file  Occupational History  . Not on file  Tobacco Use  . Smoking status: Former Games developer  . Smokeless tobacco: Never Used  . Tobacco comment: quit smoking 8+ yrs ago  Substance and Sexual Activity  . Alcohol use: No    Comment: quit yrs ago  . Drug use: No  . Sexual activity: Not Currently  Other Topics Concern  . Not on file  Social History Narrative  . Not on file    Mobility: Mostly independent occasionally requires assistive device Work history: Not obtained   Allergies  Allergen Reactions  . Ezetimibe Anaphylaxis  . Lipitor [Atorvastatin] Swelling  . Pravachol [Pravastatin] Swelling  . Statins Swelling and Other (See Comments)    Swelling of mouth and lips  . Zocor [Simvastatin] Swelling  . Cholestyramine Nausea And Vomiting    Family History  Problem Relation Age of Onset  . Diabetes Other   . Hypertension Other   . Cancer Other   . Heart disease Other       Prior to Admission medications   Medication Sig Start Date End Date Taking? Authorizing Provider  acetaminophen (TYLENOL) 325 MG tablet Take 325 mg by mouth  every 6 (six) hours as needed. pain    [provider]  aspirin EC 81 MG tablet Take 81 mg by mouth daily.    [provider]  Cholecalciferol (VITAMIN D3) 2000 units TABS Take 2,000 Units by mouth daily.    [provider]  esomeprazole (NEXIUM) 40 MG capsule Take 40 mg by mouth daily before breakfast.    [provider]  ferrous sulfate 325 (65 FE) MG tablet Take 325 mg by mouth daily with breakfast.    [provider]  losartan-hydrochlorothiazide (HYZAAR) 100-12.5 MG tablet Take 1 tablet by mouth daily.    [provider]  metoprolol tartrate (LOPRESSOR) 25 MG tablet Take 25 mg by mouth 2 (two) times daily.    [provider]  Multiple Vitamin (MULITIVITAMIN WITH MINERALS) TABS Take 1 tablet by mouth daily.    [provider]  nitroGLYCERIN (NITROSTAT) 0.4 MG SL tablet Place 0.4 mg under the tongue every 5 (five) minutes as needed for chest pain.     [provider]  polyethylene glycol powder (GLYCOLAX/MIRALAX) powder Take 17 g by mouth daily as needed for mild constipation.    [provider]    Physical Exam: Vitals:   03/06/17 0802 03/06/17 0803  BP: 125/68 125/68  Pulse: (!) 56 (!) 56  Resp: 18 18  Temp: 97.7 F (36.5 C)   TempSrc: Oral   SpO2: 100% 100%      Constitutional: NAD, calm, comfortable Eyes: PERRL, lids normal, bilateral conjunctiva pale ENMT: Mucous membranes are moist. Posterior pharynx clear of any exudate or lesions.age-appropriate dentition.  Neck: normal, supple, no masses, no thyromegaly Respiratory: clear to auscultation bilaterally, no wheezing, no crackles. Normal respiratory effort. No accessory muscle use.  Cardiovascular: Regular rate and rhythm, no murmurs / rubs / gallops. No extremity edema. 2+ pedal pulses. No carotid bruits.  Abdomen: no tenderness palpation with patient primarily reporting cramping sensation preceding bowel movement, no masses palpated. No  hepatosplenomegaly. Bowel sounds positive.  Noted with bright red blood per rectum with large clot-like bowel movement noted in bedside commode Musculoskeletal: no clubbing / cyanosis. No joint deformity upper and lower extremities. Good ROM, no contractures. Normal muscle tone.  Skin: no rashes, lesions, ulcers. No induration Neurologic: CN 2-12 grossly intact. Sensation intact, DTR normal. Strength 5/5 x all 4 extremities.  Psychiatric: Normal judgment and insight. Alert and oriented x 3. Normal mood.    Labs on Admission: I have personally reviewed following labs and imaging studies  CBC: Recent Labs  Lab 03/06/17 0839  WBC 4.5  NEUTROABS 3.0  HGB 8.7*  HCT 26.5*  MCV 92.7  PLT 243   Basic Metabolic Panel: Recent Labs  Lab 03/06/17 0839  NA 140  K 4.0  CL 106  CO2 24  GLUCOSE 117*  BUN 27*  CREATININE 1.41*  CALCIUM 8.9   GFR: CrCl cannot be calculated (Unknown ideal weight.). Liver Function Tests: Recent Labs  Lab 03/06/17 0839  AST 19  ALT 9*  ALKPHOS 43  BILITOT 0.4  PROT 5.6*  ALBUMIN 3.0*   No results for input(s): LIPASE, AMYLASE in the last 168 hours. No results for input(s): AMMONIA in the last 168 hours. Coagulation Profile: No results for input(s): INR, PROTIME in the last 168 hours. Cardiac Enzymes: No results for input(s): CKTOTAL, CKMB, CKMBINDEX, TROPONINI in the last 168 hours. BNP (last 3 results) No results for input(s): PROBNP in the last 8760 hours. HbA1C: No results for input(s): HGBA1C in the last 72 hours. CBG: No results for input(s): GLUCAP in the last 168 hours. Lipid Profile: No results for input(s): CHOL, HDL, LDLCALC, TRIG, CHOLHDL, LDLDIRECT in the last 72 hours. Thyroid Function Tests: No results for input(s): TSH, T4TOTAL, FREET4, T3FREE, THYROIDAB in the last 72 hours. Anemia Panel: No results for input(s): VITAMINB12, FOLATE, FERRITIN, TIBC, IRON, RETICCTPCT in the last 72 hours. Urine analysis:    Component Value  Date/Time   COLORURINE YELLOW 09/08/2014 1319   APPEARANCEUR CLEAR 09/08/2014 1319   LABSPEC 1.018 09/08/2014 1319   PHURINE 7.0 09/08/2014 1319   GLUCOSEU NEGATIVE 09/08/2014 1319   HGBUR TRACE (A) 09/08/2014 1319   BILIRUBINUR NEGATIVE 09/08/2014 1319   KETONESUR NEGATIVE 09/08/2014 1319   PROTEINUR NEGATIVE 09/08/2014 1319   UROBILINOGEN 1.0 09/08/2014 1319   NITRITE NEGATIVE 09/08/2014 1319   LEUKOCYTESUR NEGATIVE 09/08/2014 1319  Sepsis Labs: @LABRCNTIP (procalcitonin:4,lacticidven:4) )No results found for this or any previous visit (from the past 240 hour(s)).   Radiological Exams on Admission: No results found.   Assessment/Plan Principal Problem:   Acute lower GI bleeding.known colonic polyps and diverticula -Patient reports abrupt onset of crampy abdominal pain followed by melena now transition to bright red blood per rectum with history of recurrent GI bleeds, multiple endoscopy procedures by primary gastroenterologist with most recent virtual colonoscopy in 2017 revealing polyps and diverticular disease -Patient with bright red blood with large clots and bedside commode and nearly 2 g drop in hemoglobin therefore will proceed with stat nuclear medicine bleeding scan in the event patient can undergo transcatheter embolization -Formal GI consultation pending -NPO -Does have mild elevation in BUN-will defer to GI as to whether to continue IV PPI acutely-no epigastric or upper abdominal pain reported or reproduced on exam  **Radiologist notified attending physician of positive bleeding scan results with bleeding noted at splenic flexure of colon-we will notify GI/Dr. Levora Angel of results-unclear at this juncture if benefit from colonoscopy to address bleeding versus better candidate for transcatheter embolization.  **Discussed with GI/Dr. Jeanette Caprice 3 previous colonoscopy attempts they were unable to pass the scope past the sigmoid colon therefore recommendation is to  consult IR regarding the positive bleeding scan.  **Discussed with Dr. McCullough/IR-urgent evaluation for possible transcatheter embolization planned-we will discuss with the patient regarding low GFR and renal risks regarding dye related to arteriogram  Active Problems:   Acute blood loss anemia -Hemoglobin has drifted down from baseline of 10.2 in October to 8.7 now with acute bright red blood with large clots noted -Transfuse 2 units packed red blood cells and repeat CBC after transfusion -CBC again in a.m.-sooner if develops brisk bleeding -Hold preadmission iron acutely    HTN (hypertension) -Current blood pressure readings are suboptimal in the context of acute lower GI bleeding with anemia -Preadmission Lopressor and losartan/hydrochlorothiazide    Acute kidney injury on chronic kidney disease stage III -Baseline renal function April 2017: 17/1.07 -Current renal function: 27/1.41    Chronic bilateral lower extremity edema -Patient reports worsening soft nonpitting edema over 2 weeks and this may be related to suspected progressive anemia as above (and has chronic dark stools in the context of iron and may have been having occult bleeding prior to current presentation)    High cholesterol -Not on statin prior to admission secondary to allergy reported as swelling (ezetimibe, atorvastatin, pravastatin and Zocor)      DVT prophylaxis: SCDs Code Status: Full Family Communication: No family at bedside Disposition Plan: Home Consults called: GI/Brahmbhatt    Donalda Job L. ANP-BC [facility] Pager [phone]   If 7PM-7AM, please contact night-coverage [email or web address] Password [facility]  03/06/2017, 10:47 AM

## 2017-03-06 NOTE — Consult Note (Signed)
Chief Complaint: Patient was seen in consultation today for GI bleed  Referring Physician(s): Dr. Levora Angel  Supervising Physician: Malachy Moan  Patient Status: [facility] - In-pt  History of Present Illness: [name] is a 84 y.o. female with past medical history of GERD, HTN, CKD Stage III, and diverticulosis who presented to [facility] ED with acute rectal bleeding.  She reports bleeding started last night around 11PM.  She states she has experienced GI bleeding in the past, but that most have been well-controlled and self-resolved quickly and she thought this event would be similar.  However, this morning she was still passing BRBPR and became light-headed which prompted her to present to the ED.  GI is familiar with patient and have been consulted.  They have attempted to performed colonoscopy x2 in the past with inability to advance past the sigmoid colon due to patient's anatomy.   Nuclear medicine scan today showed: Positive for active GI bleeding. Bleeding origin appears to be the splenic flexure of colon.  IR consulted for mesenteric angiogram with possible intervention.  Case reviewed and discussed with Dr. Archer Asa who approves patient for procedure.  She has been NPO.  She does not take blood thinners.   Past Medical History:  Diagnosis Date  . Acid reflux    takes Nexium daily  . Anemia    takes Ferrous Sulfate daily  . Aortic stenosis    mild AS pk grad 18, mean grad 9, AVA 1.32 cm 03/2015 echo (Dr. Jacinto Halim)  . Arthritis   . Chronic kidney disease (CKD), stage III (moderate) (HCC)   . Diverticulitis   . Hematochezia 03/2015  . High cholesterol    can't take the meds  . History of blood transfusion 03/2015   no abnormal reaction noted  . History of GI diverticular bleed   . HTN (hypertension)    takes Losartan-HCTZ and Metoprolol daily  . Joint pain   . Joint swelling   . Nocturia   . Numbness    in legs  . Peripheral edema    occasionally  . Shortness  of breath dyspnea    occasionally and with exertion  . Slow urinary stream    at times    Past Surgical History:  Procedure Laterality Date  . ABCESS DRAINAGE     abdominal abcess  . cataract surgery Bilateral   . CHOLECYSTECTOMY    . DILATION AND CURETTAGE OF UTERUS    . ESOPHAGOGASTRODUODENOSCOPY N/A 02/23/2015   Procedure: ESOPHAGOGASTRODUODENOSCOPY (EGD);  Surgeon: Carman Ching, MD;  Location: [facility] ENDOSCOPY;  Service: Endoscopy;  Laterality: N/A;  . FLEXIBLE SIGMOIDOSCOPY N/A 02/24/2015   Procedure: FLEXIBLE SIGMOIDOSCOPY;  Surgeon: Carman Ching, MD;  Location[facility] END[facility]COPY;  Service: Endoscopy;  Laterality: N/A;  . left knee surgery    . TOTAL HIP ARTHROPLASTY Left 05/17/2015   Procedure: LEFT TOTAL HIP ARTHROPLASTY ANTERIOR APPROACH;  Surgeon: Kathryne Hitch, MD;  Location: [facility] OR;  Service: Orthopedics;  Laterality: Left;    Allergies: Ezetimibe; Lipitor [atorvastatin]; Pravachol [pravastatin]; Statins; Zocor [simvastatin]; and Cholestyramine  Medications: Prior to Admission medications   Medication Sig Start Date End Date Taking? Authorizing Provider  acetaminophen (TYLENOL) 325 MG tablet Take 325 mg by mouth every 6 (six) hours as needed. pain    [provider]  aspirin EC 81 MG tablet Take 81 mg by mouth daily.    [provider]  Cholecalciferol (VITAMIN D3) 2000 units TABS Take 2,000 Units by mouth daily.    [provider]  esomeprazole (NEXIUM) 40 MG capsule Take 40 mg by mouth daily before breakfast.    [provider]  ferrous sulfate 325 (65 FE) MG tablet Take 325 mg by mouth daily with breakfast.    [provider]  losartan-hydrochlorothiazide (HYZAAR) 100-12.5 MG tablet Take 1 tablet by mouth daily.    [provider]  metoprolol tartrate (LOPRESSOR) 25 MG tablet Take 25 mg by mouth 2 (two) times daily.    [provider]  Multiple Vitamin (MULITIVITAMIN WITH MINERALS) TABS Take 1 tablet by  mouth daily.    [provider]  nitroGLYCERIN (NITROSTAT) 0.4 MG SL tablet Place 0.4 mg under the tongue every 5 (five) minutes as needed for chest pain.     [provider]  polyethylene glycol powder (GLYCOLAX/MIRALAX) powder Take 17 g by mouth daily as needed for mild constipation.    [provider]     Family History  Problem Relation Age of Onset  . Diabetes Other   . Hypertension Other   . Cancer Other   . Heart disease Other     Social History   Socioeconomic History  . Marital status: Widowed    Spouse name: None  . Number of children: None  . Years of education: None  . Highest education level: None  Social Needs  . Financial resource strain: None  . Food insecurity - worry: None  . Food insecurity - inability: None  . Transportation needs - medical: None  . Transportation needs - non-medical: None  Occupational History  . None  Tobacco Use  . Smoking status: Former Games developer  . Smokeless tobacco: Never Used  . Tobacco comment: quit smoking 8+ yrs ago  Substance and Sexual Activity  . Alcohol use: No    Comment: quit yrs ago  . Drug use: No  . Sexual activity: Not Currently  Other Topics Concern  . None  Social History Narrative  . None    Review of Systems  Constitutional: Negative for fatigue and fever.  Respiratory: Negative for cough and shortness of breath.   Cardiovascular: Negative for chest pain.  Gastrointestinal: Positive for abdominal pain and blood in stool.  Musculoskeletal: Negative for back pain.  Psychiatric/Behavioral: Negative for behavioral problems and confusion.    Vital Signs: BP (!) 106/56   Pulse (!) 50   Temp (!) 97.5 F (36.4 C) (Oral)   Resp 19   Ht 5\' 3"  (1.6 m)   Wt 198 lb 10.2 oz (90.1 kg)   SpO2 100%   BMI 35.19 kg/m   Physical Exam  Constitutional: She is oriented to person, place, and time. She appears well-developed.  Cardiovascular: Normal rate, regular rhythm and normal heart  sounds.  Pulmonary/Chest: Effort normal and breath sounds normal. No respiratory distress.  Abdominal: Soft. There is tenderness (LLQ).  Neurological: She is alert and oriented to person, place, and time.  Skin: Skin is warm and dry.  Psychiatric: She has a normal mood and affect. Her behavior is normal. Judgment and thought content normal.  Nursing note and vitals reviewed.   Imaging: Nm Gi Blood Loss  Result Date: 03/06/2017 CLINICAL DATA:  84 year old female with GI bleeding, anemia. EXAM: NUCLEAR MEDICINE GASTROINTESTINAL BLEEDING SCAN TECHNIQUE: Sequential abdominal images were obtained following intravenous administration of Tc-19m labeled red blood cells. RADIOPHARMACEUTICALS:  25.4 mCi Tc-40m pertechnetate in-vitro labeled red cells. COMPARISON:  CT Abdomen and Pelvis 03/08/2015 FINDINGS: Physiologic blood pool activity is present, but almost immediately there is abnormal radiotracer  activity within bowel loops of the left abdomen. This appears to originate at the splenic flexure, and then transit is in both the distal transverse colon and the descending colon to the sigmoid region. Widespread diverticulosis in the large bowel on the comparison CT Abdomen and Pelvis. IMPRESSION: Positive for active GI bleeding. Bleeding origin appears to be the splenic flexure of colon. Critical Value/emergent results were called by telephone at the time of interpretation on 03/06/2017 at 1347 hr to Dr. Luberta Robertsonhatterjee in the ED, who verbally acknowledged these results. Electronically Signed   By: Odessa FlemingH  Hall M.D.   On: 03/06/2017 14:04    Labs:  CBC: Recent Labs    09/28/16 1128 11/27/16 2132 03/06/17 0839  WBC 3.1* 4.4 4.5  HGB 9.5* 10.2* 8.7*  HCT 29.2* 31.4* 26.5*  PLT 251 262 243    COAGS: No results for input(s): INR, APTT in the last 8760 hours.  BMP: Recent Labs    09/28/16 1128 11/27/16 2132 03/06/17 0839  NA 142 137 140  K 4.7 4.1 4.0  CL 108 104 106  CO2 26 26 24   GLUCOSE 101* 98  117*  BUN 24* 34* 27*  CALCIUM 9.5 9.0 8.9  CREATININE 1.54* 1.58* 1.41*  GFRNONAA 29* 27* 32*  GFRAA 33* 32* 37*    LIVER FUNCTION TESTS: Recent Labs    09/28/16 1128 03/06/17 0839  BILITOT 0.6 0.4  AST 21 19  ALT 10* 9*  ALKPHOS 53 43  PROT 6.7 5.6*  ALBUMIN 3.7 3.0*    TUMOR MARKERS: No results for input(s): AFPTM, CEA, CA199, CHROMGRNA in the last 8760 hours.  Assessment and Plan: GI bleed Patient has had multiple episodes of diverticular bleeding in the past.  This is the first to her knowledge requiring blood transfusions.  EGDs and colonoscopy have been attempted for patient due to her history of diverticulosis and polyps, however due to her anatomy none have successfully navigated past the sigmoid colon.  IR consulted for mesenteric angiogram and possible embolization.  Procedure discussed with patient and family at bedside.  Note patient's elevated SCr of 1.4 and GFR of 33. Discussed her increased risk of renal injury due to contrast use.  HgB currently 8.7.  She is getting blood transfusion.  Risks and benefits were discussed with the patient including, but not limited to bleeding, infection, vascular injury or contrast induced renal failure.  This interventional procedure involves the use of X-rays and because of the nature of the planned procedure, it is possible that we will have prolonged use of X-ray fluoroscopy.  Potential radiation risks to you include (but are not limited to) the following: - A slightly elevated risk for cancer several years later in life. This risk is typically less than 0.5% percent. This risk is low in comparison to the normal incidence of human cancer, which is 33% for women and 50% for men according to the American Cancer Society. - Radiation induced injury can include skin redness, resembling a rash, tissue breakdown / ulcers and hair loss (which can be temporary or permanent).   The likelihood of either of these occurring depends on  the difficulty of the procedure and whether you are sensitive to radiation due to previous procedures, disease, or genetic conditions.   IF your procedure requires a prolonged use of radiation, you will be notified and given written instructions for further action.  It is your responsibility to monitor the irradiated area for the 2 weeks following the procedure and to notify your  physician if you are concerned that you have suffered a radiation induced injury.    All of the patient's questions were answered, patient is agreeable to proceed.  Consent signed and in chart.  Thank you for this interesting consult.  I greatly enjoyed meeting SANTASIA REW and look forward to participating in their care.  A copy of this report was sent to the requesting provider on this date.  Electronically Signed: Hoyt Koch, PA 03/06/2017, 3:43 PM   I spent a total of 40 Minutes    in face to face in clinical consultation, greater than 50% of which was counseling/coordinating care for GI bleed.

## 2017-03-06 NOTE — Progress Notes (Signed)
-   Patient currently in radiology for Bleeding scan.  [name]- [facility] will see patient later  [name] MD, FACP 03/06/2017, 11:48 AM  Contact #  [phone]

## 2017-03-06 NOTE — Consult Note (Signed)
Referring Provider:  Dr. Silverio Lay ED Primary Care Physician:  Fleet Contras, MD Primary Gastroenterologist:  Dr. Randa Evens  Reason for Consultation:  Rectal bleeding  HPI: [name] is a 84 y.o. female appeared to the hospital for further evaluation of GI bleed. Patient with history of recurrent lower GI bleed ( presumed to be diverticular in origin) since last several years. Patient was doing fine until yesterday night when she started noticing abdominal cramps followed by multiple episodes of bright red blood per rectum. Patient denied associated nausea vomiting. Denied any weight loss. Denied any fever or chills. Upon initial evaluation in the emergency room, patient was found to have hemoglobin of 8.7. Patient's hemoglobin has been fluctuating since January 2017 with ranging between 7 to 10. Underwent bleeding scan which showed active bleeding at splenic flexure. Patient denied NSAID use. Takes baby aspirin.  Colonoscopy was aborted 11/2015 cause of difficulty in advancing scope from underlying severe diverticular disease. Patient had multiple EGD in the past. Colonoscopy in March 2012 was also aborted due to severe sigmoid diverticular disease and associated narrowing of the part of the colon.   Past Medical History:  Diagnosis Date  . Acid reflux    takes Nexium daily  . Anemia    takes Ferrous Sulfate daily  . Aortic stenosis    mild AS pk grad 18, mean grad 9, AVA 1.32 cm 03/2015 echo (Dr. Jacinto Halim)  . Arthritis   . Chronic kidney disease (CKD), stage III (moderate) (HCC)   . Diverticulitis   . Hematochezia 03/2015  . High cholesterol    can't take the meds  . History of blood transfusion 03/2015   no abnormal reaction noted  . History of GI diverticular bleed   . HTN (hypertension)    takes Losartan-HCTZ and Metoprolol daily  . Joint pain   . Joint swelling   . Nocturia   . Numbness    in legs  . Peripheral edema    occasionally  . Shortness of breath dyspnea    occasionally  and with exertion  . Slow urinary stream    at times    Past Surgical History:  Procedure Laterality Date  . ABCESS DRAINAGE     abdominal abcess  . cataract surgery Bilateral   . CHOLECYSTECTOMY    . DILATION AND CURETTAGE OF UTERUS    . ESOPHAGOGASTRODUODENOSCOPY N/A 02/23/2015   Procedure: ESOPHAGOGASTRODUODENOSCOPY (EGD);  Surgeon: Carman Ching, MD;  Location: [facility] ENDOSCOPY;  Service: Endoscopy;  Laterality: N/A;  . FLEXIBLE SIGMOIDOSCOPY N/A 02/24/2015   Procedure: FLEXIBLE SIGMOIDOSCOPY;  Surgeon: Carman Ching, MD;  Location: [facility] ENDOSCOPY;  Service: Endoscopy;  Laterality: N/A;  . left knee surgery    . TOTAL HIP ARTHROPLASTY Left 05/17/2015   Procedure: LEFT TOTAL HIP ARTHROPLASTY ANTERIOR APPROACH;  Surgeon: Kathryne Hitch, MD;  Location: [facility] OR;  Service: Orthopedics;  Laterality: Left;    Prior to Admission medications   Medication Sig Start Date End Date Taking? Authorizing Provider  acetaminophen (TYLENOL) 325 MG tablet Take 325 mg by mouth every 6 (six) hours as needed. pain    [provider]  aspirin EC 81 MG tablet Take 81 mg by mouth daily.    [provider]  Cholecalciferol (VITAMIN D3) 2000 units TABS Take 2,000 Units by mouth daily.    [provider]  esomeprazole (NEXIUM) 40 MG capsule Take 40 mg by mouth daily before breakfast.    [provider]  ferrous sulfate 325 (65 FE) MG tablet  Take 325 mg by mouth daily with breakfast.    [provider]  losartan-hydrochlorothiazide (HYZAAR) 100-12.5 MG tablet Take 1 tablet by mouth daily.    [provider]  metoprolol tartrate (LOPRESSOR) 25 MG tablet Take 25 mg by mouth 2 (two) times daily.    [provider]  Multiple Vitamin (MULITIVITAMIN WITH MINERALS) TABS Take 1 tablet by mouth daily.    [provider]  nitroGLYCERIN (NITROSTAT) 0.4 MG SL tablet Place 0.4 mg under the tongue every 5 (five) minutes as needed for chest pain.      [provider]  polyethylene glycol powder (GLYCOLAX/MIRALAX) powder Take 17 g by mouth daily as needed for mild constipation.    [provider]    Scheduled Meds: Continuous Infusions: . sodium chloride    . sodium chloride 75 mL/hr at 03/06/17 1442   PRN Meds:.acetaminophen **OR** acetaminophen, ondansetron **OR** ondansetron (ZOFRAN) IV  Allergies as of 03/06/2017 - Review Complete 03/06/2017  Allergen Reaction Noted  . Ezetimibe Anaphylaxis 02/23/2012  . Lipitor [atorvastatin] Swelling 02/23/2012  . Pravachol [pravastatin] Swelling 02/23/2012  . Statins Swelling and Other (See Comments) 02/23/2012  . Zocor [simvastatin] Swelling 02/23/2012  . Cholestyramine Nausea And Vomiting 02/23/2012    Family History  Problem Relation Age of Onset  . Diabetes Other   . Hypertension Other   . Cancer Other   . Heart disease Other     Social History   Socioeconomic History  . Marital status: Widowed    Spouse name: Not on file  . Number of children: Not on file  . Years of education: Not on file  . Highest education level: Not on file  Social Needs  . Financial resource strain: Not on file  . Food insecurity - worry: Not on file  . Food insecurity - inability: Not on file  . Transportation needs - medical: Not on file  . Transportation needs - non-medical: Not on file  Occupational History  . Not on file  Tobacco Use  . Smoking status: Former Games developer  . Smokeless tobacco: Never Used  . Tobacco comment: quit smoking 8+ yrs ago  Substance and Sexual Activity  . Alcohol use: No    Comment: quit yrs ago  . Drug use: No  . Sexual activity: Not Currently  Other Topics Concern  . Not on file  Social History Narrative  . Not on file    Review of Systems: Review of Systems  Constitutional: Negative for chills, fever and weight loss.  HENT: Negative for hearing loss and tinnitus.   Eyes: Negative for blurred vision and double vision.  Respiratory:  Negative for cough and hemoptysis.   Cardiovascular: Negative for chest pain and palpitations.  Gastrointestinal: Positive for abdominal pain and blood in stool. Negative for heartburn, nausea and vomiting.  Genitourinary: Negative for dysuria and urgency.  Musculoskeletal: Positive for joint pain. Negative for myalgias.  Skin: Negative for itching and rash.  Neurological: Negative for seizures and loss of consciousness.  Endo/Heme/Allergies: Bruises/bleeds easily.  Psychiatric/Behavioral: Negative for hallucinations and suicidal ideas.    Physical Exam: Vital signs: Vitals:   03/06/17 1300 03/06/17 1414  BP: (!) 106/56   Pulse: (!) 50   Resp: 19   Temp:  (!) 97.5 F (36.4 C)  SpO2: 100% 100%     Physical Exam  Constitutional: She is oriented to person, place, and time. She appears well-developed and well-nourished. No distress.  HENT:  Head: Normocephalic and atraumatic.  Mouth/Throat: No oropharyngeal  exudate.  Eyes: EOM are normal. No scleral icterus.  Neck: Normal range of motion. Neck supple. No thyromegaly present.  Cardiovascular: Normal rate and regular rhythm.  Pulmonary/Chest: Effort normal and breath sounds normal. No respiratory distress.  Abdominal: Soft. Bowel sounds are normal. She exhibits no distension. There is no tenderness. There is no guarding.  Scar marks from previous surgery noted  Musculoskeletal: Normal range of motion. She exhibits edema.  Neurological: She is alert and oriented to person, place, and time.  Skin: Skin is warm. No erythema.  Psychiatric: She has a normal mood and affect. Judgment and thought content normal.  Nursing note and vitals reviewed.   GI:  Lab Results: Recent Labs    03/06/17 0839  WBC 4.5  HGB 8.7*  HCT 26.5*  PLT 243   BMET Recent Labs    03/06/17 0839  NA 140  K 4.0  CL 106  CO2 24  GLUCOSE 117*  BUN 27*  CREATININE 1.41*  CALCIUM 8.9   LFT Recent Labs    03/06/17 0839  PROT 5.6*  ALBUMIN 3.0*   AST 19  ALT 9*  ALKPHOS 43  BILITOT 0.4   PT/INR No results for input(s): LABPROT, INR in the last 72 hours.   Studies/Results: Nm Gi Blood Loss  Result Date: 03/06/2017 CLINICAL DATA:  84 year old female with GI bleeding, anemia. EXAM: NUCLEAR MEDICINE GASTROINTESTINAL BLEEDING SCAN TECHNIQUE: Sequential abdominal images were obtained following intravenous administration of Tc-6772m labeled red blood cells. RADIOPHARMACEUTICALS:  25.4 mCi Tc-6772m pertechnetate in-vitro labeled red cells. COMPARISON:  CT Abdomen and Pelvis 03/08/2015 FINDINGS: Physiologic blood pool activity is present, but almost immediately there is abnormal radiotracer activity within bowel loops of the left abdomen. This appears to originate at the splenic flexure, and then transit is in both the distal transverse colon and the descending colon to the sigmoid region. Widespread diverticulosis in the large bowel on the comparison CT Abdomen and Pelvis. IMPRESSION: Positive for active GI bleeding. Bleeding origin appears to be the splenic flexure of colon. Critical Value/emergent results were called by telephone at the time of interpretation on 03/06/2017 at 1347 hr to Dr. Luberta Robertsonhatterjee in the ED, who verbally acknowledged these results. Electronically Signed   By: Odessa FlemingH  Hall M.D.   On: 03/06/2017 14:04    Impression/Plan: - Lower GI bleed with positive bleeding scan showing active bleeding at splenic flexure. - Recurrent lower GI bleed. Probably from diverticular bleed - Chronic kidney disease with mildly elevated creatinine 1.41.  Recommendations ----------------------- - Patient with history of severe diverticular disease. Colonoscopy in the past was aborted (twice) as scope was unable to advance beyond the sigmoid colon area because of severe recurrent disease related narrowing. - Discuss with admitting team. Recommend interventional radiology consult for angiography and possible embolization. - Patient  have a chronic kidney  disease but her creatinine is mildly elevated at 1.41. Risk of worsening kidney disease with angiography discussed with the patient. She verbalized understanding. - Transfuse as needed. Monitor H&H. - GI will follow     LOS: 0 days   Kathi DerParag Symphonie Schneiderman  MD, FACP 03/06/2017, 2:51 PM  Contact #  [phone]

## 2017-03-06 NOTE — ED Notes (Signed)
Unsuccessful attempt at giving report to 2C. 

## 2017-03-06 NOTE — ED Notes (Signed)
Pt transported to IR 

## 2017-03-06 NOTE — ED Triage Notes (Signed)
Pt to ER for lower abdominal pain with GI bleeding, states is dark/tarry onset last night at 11 pm, states hx of same, not anticoagulated. VSS at triage. Pt in NAD.

## 2017-03-06 NOTE — ED Notes (Signed)
Pt has moderate amount of red bleeding and large clots passed into bedpan, on bedside commode.

## 2017-03-07 ENCOUNTER — Encounter (HOSPITAL_COMMUNITY): Payer: Self-pay | Admitting: Interventional Radiology

## 2017-03-07 DIAGNOSIS — K922 Gastrointestinal hemorrhage, unspecified: Secondary | ICD-10-CM

## 2017-03-07 DIAGNOSIS — D62 Acute posthemorrhagic anemia: Secondary | ICD-10-CM

## 2017-03-07 DIAGNOSIS — K5730 Diverticulosis of large intestine without perforation or abscess without bleeding: Secondary | ICD-10-CM

## 2017-03-07 DIAGNOSIS — I10 Essential (primary) hypertension: Secondary | ICD-10-CM

## 2017-03-07 DIAGNOSIS — N179 Acute kidney failure, unspecified: Secondary | ICD-10-CM

## 2017-03-07 LAB — COMPREHENSIVE METABOLIC PANEL
ALT: 7 U/L — ABNORMAL LOW (ref 14–54)
AST: 17 U/L (ref 15–41)
Albumin: 2.5 g/dL — ABNORMAL LOW (ref 3.5–5.0)
Alkaline Phosphatase: 36 U/L — ABNORMAL LOW (ref 38–126)
Anion gap: 8 (ref 5–15)
BUN: 20 mg/dL (ref 6–20)
CO2: 22 mmol/L (ref 22–32)
Calcium: 8.1 mg/dL — ABNORMAL LOW (ref 8.9–10.3)
Chloride: 111 mmol/L (ref 101–111)
Creatinine, Ser: 1.22 mg/dL — ABNORMAL HIGH (ref 0.44–1.00)
GFR calc Af Amer: 44 mL/min — ABNORMAL LOW (ref >60)
GFR calc non Af Amer: 38 mL/min — ABNORMAL LOW (ref >60)
Glucose, Bld: 101 mg/dL — ABNORMAL HIGH (ref 65–99)
Potassium: 4.1 mmol/L (ref 3.5–5.1)
Sodium: 141 mmol/L (ref 135–145)
Total Bilirubin: 0.6 mg/dL (ref 0.3–1.2)
Total Protein: 4.6 g/dL — ABNORMAL LOW (ref 6.5–8.1)

## 2017-03-07 LAB — CBC
HCT: 25.7 % — ABNORMAL LOW (ref 36.0–46.0)
HCT: 24.4 % — ABNORMAL LOW (ref 36.0–46.0)
Hemoglobin: 8.6 g/dL — ABNORMAL LOW (ref 12.0–15.0)
Hemoglobin: 8.1 g/dL — ABNORMAL LOW (ref 12.0–15.0)
MCH: 30.5 pg (ref 26.0–34.0)
MCH: 29.7 pg (ref 26.0–34.0)
MCHC: 33.5 g/dL (ref 30.0–36.0)
MCHC: 33.2 g/dL (ref 30.0–36.0)
MCV: 91.1 fL (ref 78.0–100.0)
MCV: 89.4 fL (ref 78.0–100.0)
Platelets: 147 10*3/uL — ABNORMAL LOW (ref 150–400)
Platelets: 143 10*3/uL — ABNORMAL LOW (ref 150–400)
RBC: 2.82 MIL/uL — ABNORMAL LOW (ref 3.87–5.11)
RBC: 2.73 MIL/uL — ABNORMAL LOW (ref 3.87–5.11)
RDW: 15.5 % (ref 11.5–15.5)
RDW: 15.3 % (ref 11.5–15.5)
WBC: 8.2 10*3/uL (ref 4.0–10.5)
WBC: 8.2 10*3/uL (ref 4.0–10.5)

## 2017-03-07 LAB — HEMOGLOBIN AND HEMATOCRIT, BLOOD
HCT: 23.1 % — ABNORMAL LOW (ref 36.0–46.0)
Hemoglobin: 7.8 g/dL — ABNORMAL LOW (ref 12.0–15.0)

## 2017-03-07 NOTE — Progress Notes (Signed)
Chief Complaint: Patient was seen today for follow up angiogram/ebolization for GI beed   Supervising Physician: Irish Lack  Patient Status: [facility] - In-pt  Subjective: S/p Visceral angiogram with coil embolization of bleeding branch of the left colic artery.  Resting in bed. No c/o pain. Reports bloody BM last night about 9pm and again this am at 0300, nothing since then.  Objective: Physical Exam: BP 129/74   Pulse 62   Temp 98.4 F (36.9 C) (Oral)   Resp 13   Ht 5\' 3"  (1.6 m)   Wt 200 lb 9.9 oz (91 kg)   SpO2 100%   BMI 35.54 kg/m  Abd: soft, NT. Ext: (R)groin soft, NT, no hematoma, legs/feet warm   Current Facility-Administered Medications:  .  0.9 %  sodium chloride infusion, , Intravenous, Once, Junious Silk L, NP .  0.9 %  sodium chloride infusion, , Intravenous, Continuous, Russella Dar, NP, Last Rate: 75 mL/hr at 03/06/17 1849 .  acetaminophen (TYLENOL) tablet 650 mg, 650 mg, Oral, Q6H PRN **OR** acetaminophen (TYLENOL) suppository 650 mg, 650 mg, Rectal, Q6H PRN, Russella Dar, NP .  ondansetron (ZOFRAN) tablet 4 mg, 4 mg, Oral, Q6H PRN **OR** ondansetron (ZOFRAN) injection 4 mg, 4 mg, Intravenous, Q6H PRN, Russella Dar, NP  Labs: CBC Recent Labs    03/07/17 0310 03/07/17 0745  WBC 8.2 8.2  HGB 8.6* 8.1*  HCT 25.7* 24.4*  PLT 147* 143*   BMET Recent Labs    03/06/17 0839 03/07/17 0745  NA 140 141  K 4.0 4.1  CL 106 111  CO2 24 22  GLUCOSE 117* 101*  BUN 27* 20  CREATININE 1.41* 1.22*  CALCIUM 8.9 8.1*   LFT Recent Labs    03/07/17 0745  PROT 4.6*  ALBUMIN 2.5*  AST 17  ALT 7*  ALKPHOS 36*  BILITOT 0.6   PT/INR No results for input(s): LABPROT, INR in the last 72 hours.   Studies/Results: Nm Gi Blood Loss  Result Date: 03/06/2017 CLINICAL DATA:  84 year old female with GI bleeding, anemia. EXAM: NUCLEAR MEDICINE GASTROINTESTINAL BLEEDING SCAN TECHNIQUE: Sequential abdominal images were obtained following  intravenous administration of Tc-39m labeled red blood cells. RADIOPHARMACEUTICALS:  25.4 mCi Tc-34m pertechnetate in-vitro labeled red cells. COMPARISON:  CT Abdomen and Pelvis 03/08/2015 FINDINGS: Physiologic blood pool activity is present, but almost immediately there is abnormal radiotracer activity within bowel loops of the left abdomen. This appears to originate at the splenic flexure, and then transit is in both the distal transverse colon and the descending colon to the sigmoid region. Widespread diverticulosis in the large bowel on the comparison CT Abdomen and Pelvis. IMPRESSION: Positive for active GI bleeding. Bleeding origin appears to be the splenic flexure of colon. Critical Value/emergent results were called by telephone at the time of interpretation on 03/06/2017 at 1347 hr to Dr. Luberta Robertson in the ED, who verbally acknowledged these results. Electronically Signed   By: Odessa Fleming M.D.   On: 03/06/2017 14:04   Ir Angiogram Visceral Selective  Result Date: 03/07/2017 INDICATION: 84 year old female with acute lower GI bleed with the site of bleeding localized to the region of the splenic flexure on tagged RBC scan. She presents for visceral angiography and possible embolization. EXAM: IR EMBO ARTERIAL NOT HEMORR HEMANG INC GUIDE ROADMAPPING; SELECTIVE VISCERAL ARTERIOGRAPHY; ADDITIONAL ARTERIOGRAPHY; IR ULTRASOUND GUIDANCE VASC ACCESS RIGHT 1. Ultrasound-guided vascular access, right common femoral artery 2. Celiac artery catheterization and angiogram (first order) 3. Superior mesenteric artery catheterization and  angiogram (first order) 4. Inferior mesenteric artery catheterization and angiogram with additional sub selective arteriography as follows 5. Left colic artery catheterization and angiogram (second order) 6. Ascending branch of the left colic artery catheterization and angiogram (third order) 7. Marginal artery catheterization and angiogram (third order, additional after basic) 8. Coil  embolization of marginal artery 9. Catheterization of the descending branch of the left colic artery with angiogram (third order, additional after basic) 10. Catheterization of a distal branch of the ascending branch of the left colic artery with angiogram (third order, additional after basic) MEDICATIONS: None. ANESTHESIA/SEDATION: Moderate (conscious) sedation was employed during this procedure. A total of Versed 1.5 mg and Fentanyl 50 mcg was administered intravenously. Moderate Sedation Time: 44 minutes. The patient's level of consciousness and vital signs were monitored continuously by radiology nursing throughout the procedure under my direct supervision. CONTRAST:  Approximately 75 mL Isovue 300 FLUOROSCOPY TIME:  Fluoroscopy Time: 13 minutes 24 seconds (907 mGy). COMPLICATIONS: None immediate. PROCEDURE: Informed consent was obtained from the patient following explanation of the procedure, risks, benefits and alternatives. The patient understands, agrees and consents for the procedure. All questions were addressed. A time out was performed prior to the initiation of the procedure. Maximal barrier sterile technique utilized including caps, mask, sterile gowns, sterile gloves, large sterile drape, hand hygiene, and Betadine prep. The right common femoral artery was interrogated with ultrasound and found to be widely patent. An image was obtained and stored for the medical record. Local anesthesia was attained by infiltration with 1% lidocaine. A small dermatotomy was made. Under real-time sonographic guidance, the vessel was punctured with a 21 gauge micropuncture needle. Using standard technique, the initial micro needle was exchanged over a 0.018 micro wire for a transitional 4 [place] micro sheath. The micro sheath was then exchanged over a 0.035 wire for a 5 French vascular sheath. A Bentson wire was advanced in the abdominal aorta. A rim catheter was advanced over the wire. The rim catheter was used to  select the inferior mesenteric artery. An arteriogram was performed. There is early nonvascular extravasation of contrast material arising from what appears to be the marginal artery at the splenic flexure. This corresponds with the site of bleeding seen on the recent tagged red blood cell study. Interestingly, there is a second site of apparent extravasation arising from a distal visceral branch of the ascending and division of the left colic artery in the proximal descending colon. This extravasation is less robust than the region of extravasation in the marginal artery. A renegade ST microcatheter was then advanced over a Fathom 16 wire in used to select the left colic artery. A left colic arteriogram was performed. The anatomy of the ascending and descending branches was successfully identified. There is persistent contrast extravasation from the marginal artery. No definite contrast extravasation noted on this second angiogram in the region of the descending colon. The catheter was further advanced into the ascending branch of the left colic artery. Arteriography was performed confirming continued abnormal extravasation of contrast from the marginal artery. This has an ovoid appearance and may represent pooling within a dependent diverticulum versus fusiform aneurysmal dilatation. The microcatheter was successfully advanced into the marginal artery. Contrast demonstrates frank extravasation. Coil embolization was performed using a series of 2 and 3 mm fibered detachable interlock coils. Post embolization arteriography confirms complete occlusion of this vessel with no further contrast extravasation. No collateral filling. The microcatheter was brought back into the left colic artery and advanced into the  descending division of the left colic artery to further evaluate the other potential site of bleeding. Arteriography was performed. No evidence of bleeding in the proximal descending colon. The microcatheter  was readvanced into the ascending branch and out into the distal visceral branch that appeared to be the site of bleeding on the initial angiogram. Arteriography was performed and carried out to the venous phase. Again, there is no evidence of bleeding at this site. No definite vessel abnormality was identified. No embolization was performed at this site. The microcatheter and rim catheter were removed. A C2 cobra catheter was advanced over a Bentson wire into the abdominal aorta and used to select the celiac artery. A celiac arteriogram was performed. Normal arterial anatomy. No evidence of replaced middle colic artery. No active bleeding. The superior mesenteric artery was then catheterized. Arteriography was performed. No evidence of back bleeding from the middle colic artery where it anastomosis with the left colic artery. No additional sites of bleeding were identified. The catheter was removed. A limited right common femoral arteriogram was performed. Hemostasis was attained with the assistance of an Angio-Seal device. IMPRESSION: 1. Positive angiography with potentially 2 sites of active bleeding in the region of the splenic flexure and proximal descending colon. 2. Successful coil embolization of the more definitive site of bleeding. 3. The second potential site of bleeding could not be identified on subsequent angiography and may have been artifactual. PLAN: 1. Patient is at risk for contrast induced nephropathy given baseline stage 3 chronic kidney disease. Recommend trending BUN and creatinine over the next 48 hours. Keep patient well hydrated. 2. Continue to trend H and H and transfuse as needed. 3. If there is evidence of recurrent or persistent active lower GI bleeding, repeat angiography should be considered. Signed, Sterling Big, MD [facility] Radiology Electronically Signed   By: Malachy Moan M.D.   On: 03/07/2017 09:37   Ir Angiogram  Visceral Selective  Result Date: 03/07/2017 INDICATION: 84 year old female with acute lower GI bleed with the site of bleeding localized to the region of the splenic flexure on tagged RBC scan. She presents for visceral angiography and possible embolization. EXAM: IR EMBO ARTERIAL NOT HEMORR HEMANG INC GUIDE ROADMAPPING; SELECTIVE VISCERAL ARTERIOGRAPHY; ADDITIONAL ARTERIOGRAPHY; IR ULTRASOUND GUIDANCE VASC ACCESS RIGHT 1. Ultrasound-guided vascular access, right common femoral artery 2. Celiac artery catheterization and angiogram (first order) 3. Superior mesenteric artery catheterization and angiogram (first order) 4. Inferior mesenteric artery catheterization and angiogram with additional sub selective arteriography as follows 5. Left colic artery catheterization and angiogram (second order) 6. Ascending branch of the left colic artery catheterization and angiogram (third order) 7. Marginal artery catheterization and angiogram (third order, additional after basic) 8. Coil embolization of marginal artery 9. Catheterization of the descending branch of the left colic artery with angiogram (third order, additional after basic) 10. Catheterization of a distal branch of the ascending branch of the left colic artery with angiogram (third order, additional after basic) MEDICATIONS: None. ANESTHESIA/SEDATION: Moderate (conscious) sedation was employed during this procedure. A total of Versed 1.5 mg and Fentanyl 50 mcg was administered intravenously. Moderate Sedation Time: 44 minutes. The patient's level of consciousness and vital signs were monitored continuously by radiology nursing throughout the procedure under my direct supervision. CONTRAST:  Approximately 75 mL Isovue 300 FLUOROSCOPY TIME:  Fluoroscopy Time: 13 minutes 24 seconds (907 mGy). COMPLICATIONS: None immediate. PROCEDURE: Informed consent was obtained from the patient following explanation of the procedure, risks, benefits and  alternatives. The patient  understands, agrees and consents for the procedure. All questions were addressed. A time out was performed prior to the initiation of the procedure. Maximal barrier sterile technique utilized including caps, mask, sterile gowns, sterile gloves, large sterile drape, hand hygiene, and Betadine prep. The right common femoral artery was interrogated with ultrasound and found to be widely patent. An image was obtained and stored for the medical record. Local anesthesia was attained by infiltration with 1% lidocaine. A small dermatotomy was made. Under real-time sonographic guidance, the vessel was punctured with a 21 gauge micropuncture needle. Using standard technique, the initial micro needle was exchanged over a 0.018 micro wire for a transitional 4 [place] micro sheath. The micro sheath was then exchanged over a 0.035 wire for a 5 French vascular sheath. A Bentson wire was advanced in the abdominal aorta. A rim catheter was advanced over the wire. The rim catheter was used to select the inferior mesenteric artery. An arteriogram was performed. There is early nonvascular extravasation of contrast material arising from what appears to be the marginal artery at the splenic flexure. This corresponds with the site of bleeding seen on the recent tagged red blood cell study. Interestingly, there is a second site of apparent extravasation arising from a distal visceral branch of the ascending and division of the left colic artery in the proximal descending colon. This extravasation is less robust than the region of extravasation in the marginal artery. A renegade ST microcatheter was then advanced over a Fathom 16 wire in used to select the left colic artery. A left colic arteriogram was performed. The anatomy of the ascending and descending branches was successfully identified. There is persistent contrast extravasation from the marginal artery. No definite contrast extravasation noted on this second angiogram in the region  of the descending colon. The catheter was further advanced into the ascending branch of the left colic artery. Arteriography was performed confirming continued abnormal extravasation of contrast from the marginal artery. This has an ovoid appearance and may represent pooling within a dependent diverticulum versus fusiform aneurysmal dilatation. The microcatheter was successfully advanced into the marginal artery. Contrast demonstrates frank extravasation. Coil embolization was performed using a series of 2 and 3 mm fibered detachable interlock coils. Post embolization arteriography confirms complete occlusion of this vessel with no further contrast extravasation. No collateral filling. The microcatheter was brought back into the left colic artery and advanced into the descending division of the left colic artery to further evaluate the other potential site of bleeding. Arteriography was performed. No evidence of bleeding in the proximal descending colon. The microcatheter was readvanced into the ascending branch and out into the distal visceral branch that appeared to be the site of bleeding on the initial angiogram. Arteriography was performed and carried out to the venous phase. Again, there is no evidence of bleeding at this site. No definite vessel abnormality was identified. No embolization was performed at this site. The microcatheter and rim catheter were removed. A C2 cobra catheter was advanced over a Bentson wire into the abdominal aorta and used to select the celiac artery. A celiac arteriogram was performed. Normal arterial anatomy. No evidence of replaced middle colic artery. No active bleeding. The superior mesenteric artery was then catheterized. Arteriography was performed. No evidence of back bleeding from the middle colic artery where it anastomosis with the left colic artery. No additional sites of bleeding were identified. The catheter was removed. A limited right common femoral arteriogram was  performed. Hemostasis  was attained with the assistance of an Angio-Seal device. IMPRESSION: 1. Positive angiography with potentially 2 sites of active bleeding in the region of the splenic flexure and proximal descending colon. 2. Successful coil embolization of the more definitive site of bleeding. 3. The second potential site of bleeding could not be identified on subsequent angiography and may have been artifactual. PLAN: 1. Patient is at risk for contrast induced nephropathy given baseline stage 3 chronic kidney disease. Recommend trending BUN and creatinine over the next 48 hours. Keep patient well hydrated. 2. Continue to trend H and H and transfuse as needed. 3. If there is evidence of recurrent or persistent active lower GI bleeding, repeat angiography should be considered. Signed, Sterling Big, MD [facility] Radiology Electronically Signed   By: Malachy Moan M.D.   On: 03/07/2017 09:37   Ir Angiogram Visceral Selective  Result Date: 03/07/2017 INDICATION: 84 year old female with acute lower GI bleed with the site of bleeding localized to the region of the splenic flexure on tagged RBC scan. She presents for visceral angiography and possible embolization. EXAM: IR EMBO ARTERIAL NOT HEMORR HEMANG INC GUIDE ROADMAPPING; SELECTIVE VISCERAL ARTERIOGRAPHY; ADDITIONAL ARTERIOGRAPHY; IR ULTRASOUND GUIDANCE VASC ACCESS RIGHT 1. Ultrasound-guided vascular access, right common femoral artery 2. Celiac artery catheterization and angiogram (first order) 3. Superior mesenteric artery catheterization and angiogram (first order) 4. Inferior mesenteric artery catheterization and angiogram with additional sub selective arteriography as follows 5. Left colic artery catheterization and angiogram (second order) 6. Ascending branch of the left colic artery catheterization and angiogram (third order) 7. Marginal artery catheterization and angiogram (third order,  additional after basic) 8. Coil embolization of marginal artery 9. Catheterization of the descending branch of the left colic artery with angiogram (third order, additional after basic) 10. Catheterization of a distal branch of the ascending branch of the left colic artery with angiogram (third order, additional after basic) MEDICATIONS: None. ANESTHESIA/SEDATION: Moderate (conscious) sedation was employed during this procedure. A total of Versed 1.5 mg and Fentanyl 50 mcg was administered intravenously. Moderate Sedation Time: 44 minutes. The patient's level of consciousness and vital signs were monitored continuously by radiology nursing throughout the procedure under my direct supervision. CONTRAST:  Approximately 75 mL Isovue 300 FLUOROSCOPY TIME:  Fluoroscopy Time: 13 minutes 24 seconds (907 mGy). COMPLICATIONS: None immediate. PROCEDURE: Informed consent was obtained from the patient following explanation of the procedure, risks, benefits and alternatives. The patient understands, agrees and consents for the procedure. All questions were addressed. A time out was performed prior to the initiation of the procedure. Maximal barrier sterile technique utilized including caps, mask, sterile gowns, sterile gloves, large sterile drape, hand hygiene, and Betadine prep. The right common femoral artery was interrogated with ultrasound and found to be widely patent. An image was obtained and stored for the medical record. Local anesthesia was attained by infiltration with 1% lidocaine. A small dermatotomy was made. Under real-time sonographic guidance, the vessel was punctured with a 21 gauge micropuncture needle. Using standard technique, the initial micro needle was exchanged over a 0.018 micro wire for a transitional 4 [place] micro sheath. The micro sheath was then exchanged over a 0.035 wire for a 5 French vascular sheath. A Bentson wire was advanced in the abdominal aorta. A rim catheter was advanced over the wire.  The rim catheter was used to select the inferior mesenteric artery. An arteriogram was performed. There is early nonvascular extravasation of contrast material arising from what appears to be  the marginal artery at the splenic flexure. This corresponds with the site of bleeding seen on the recent tagged red blood cell study. Interestingly, there is a second site of apparent extravasation arising from a distal visceral branch of the ascending and division of the left colic artery in the proximal descending colon. This extravasation is less robust than the region of extravasation in the marginal artery. A renegade ST microcatheter was then advanced over a Fathom 16 wire in used to select the left colic artery. A left colic arteriogram was performed. The anatomy of the ascending and descending branches was successfully identified. There is persistent contrast extravasation from the marginal artery. No definite contrast extravasation noted on this second angiogram in the region of the descending colon. The catheter was further advanced into the ascending branch of the left colic artery. Arteriography was performed confirming continued abnormal extravasation of contrast from the marginal artery. This has an ovoid appearance and may represent pooling within a dependent diverticulum versus fusiform aneurysmal dilatation. The microcatheter was successfully advanced into the marginal artery. Contrast demonstrates frank extravasation. Coil embolization was performed using a series of 2 and 3 mm fibered detachable interlock coils. Post embolization arteriography confirms complete occlusion of this vessel with no further contrast extravasation. No collateral filling. The microcatheter was brought back into the left colic artery and advanced into the descending division of the left colic artery to further evaluate the other potential site of bleeding. Arteriography was performed. No evidence of bleeding in the proximal  descending colon. The microcatheter was readvanced into the ascending branch and out into the distal visceral branch that appeared to be the site of bleeding on the initial angiogram. Arteriography was performed and carried out to the venous phase. Again, there is no evidence of bleeding at this site. No definite vessel abnormality was identified. No embolization was performed at this site. The microcatheter and rim catheter were removed. A C2 cobra catheter was advanced over a Bentson wire into the abdominal aorta and used to select the celiac artery. A celiac arteriogram was performed. Normal arterial anatomy. No evidence of replaced middle colic artery. No active bleeding. The superior mesenteric artery was then catheterized. Arteriography was performed. No evidence of back bleeding from the middle colic artery where it anastomosis with the left colic artery. No additional sites of bleeding were identified. The catheter was removed. A limited right common femoral arteriogram was performed. Hemostasis was attained with the assistance of an Angio-Seal device. IMPRESSION: 1. Positive angiography with potentially 2 sites of active bleeding in the region of the splenic flexure and proximal descending colon. 2. Successful coil embolization of the more definitive site of bleeding. 3. The second potential site of bleeding could not be identified on subsequent angiography and may have been artifactual. PLAN: 1. Patient is at risk for contrast induced nephropathy given baseline stage 3 chronic kidney disease. Recommend trending BUN and creatinine over the next 48 hours. Keep patient well hydrated. 2. Continue to trend H and H and transfuse as needed. 3. If there is evidence of recurrent or persistent active lower GI bleeding, repeat angiography should be considered. Signed, Sterling BigHeath K. McCullough, MD [facility] Electronically Signed   By: Malachy MoanHeath  McCullough M.D.   On:  03/07/2017 09:37   Ir Angiogram Selective Each Additional Vessel  Result Date: 03/07/2017 INDICATION: 84 year old female with acute lower GI bleed with the site of bleeding localized to the region of the splenic flexure on tagged RBC  scan. She presents for visceral angiography and possible embolization. EXAM: IR EMBO ARTERIAL NOT HEMORR HEMANG INC GUIDE ROADMAPPING; SELECTIVE VISCERAL ARTERIOGRAPHY; ADDITIONAL ARTERIOGRAPHY; IR ULTRASOUND GUIDANCE VASC ACCESS RIGHT 1. Ultrasound-guided vascular access, right common femoral artery 2. Celiac artery catheterization and angiogram (first order) 3. Superior mesenteric artery catheterization and angiogram (first order) 4. Inferior mesenteric artery catheterization and angiogram with additional sub selective arteriography as follows 5. Left colic artery catheterization and angiogram (second order) 6. Ascending branch of the left colic artery catheterization and angiogram (third order) 7. Marginal artery catheterization and angiogram (third order, additional after basic) 8. Coil embolization of marginal artery 9. Catheterization of the descending branch of the left colic artery with angiogram (third order, additional after basic) 10. Catheterization of a distal branch of the ascending branch of the left colic artery with angiogram (third order, additional after basic) MEDICATIONS: None. ANESTHESIA/SEDATION: Moderate (conscious) sedation was employed during this procedure. A total of Versed 1.5 mg and Fentanyl 50 mcg was administered intravenously. Moderate Sedation Time: 44 minutes. The patient's level of consciousness and vital signs were monitored continuously by radiology nursing throughout the procedure under my direct supervision. CONTRAST:  Approximately 75 mL Isovue 300 FLUOROSCOPY TIME:  Fluoroscopy Time: 13 minutes 24 seconds (907 mGy). COMPLICATIONS: None immediate. PROCEDURE: Informed consent was obtained from the patient following explanation of the  procedure, risks, benefits and alternatives. The patient understands, agrees and consents for the procedure. All questions were addressed. A time out was performed prior to the initiation of the procedure. Maximal barrier sterile technique utilized including caps, mask, sterile gowns, sterile gloves, large sterile drape, hand hygiene, and Betadine prep. The right common femoral artery was interrogated with ultrasound and found to be widely patent. An image was obtained and stored for the medical record. Local anesthesia was attained by infiltration with 1% lidocaine. A small dermatotomy was made. Under real-time sonographic guidance, the vessel was punctured with a 21 gauge micropuncture needle. Using standard technique, the initial micro needle was exchanged over a 0.018 micro wire for a transitional 4 [place] micro sheath. The micro sheath was then exchanged over a 0.035 wire for a 5 French vascular sheath. A Bentson wire was advanced in the abdominal aorta. A rim catheter was advanced over the wire. The rim catheter was used to select the inferior mesenteric artery. An arteriogram was performed. There is early nonvascular extravasation of contrast material arising from what appears to be the marginal artery at the splenic flexure. This corresponds with the site of bleeding seen on the recent tagged red blood cell study. Interestingly, there is a second site of apparent extravasation arising from a distal visceral branch of the ascending and division of the left colic artery in the proximal descending colon. This extravasation is less robust than the region of extravasation in the marginal artery. A renegade ST microcatheter was then advanced over a Fathom 16 wire in used to select the left colic artery. A left colic arteriogram was performed. The anatomy of the ascending and descending branches was successfully identified. There is persistent contrast extravasation from the marginal artery. No definite contrast  extravasation noted on this second angiogram in the region of the descending colon. The catheter was further advanced into the ascending branch of the left colic artery. Arteriography was performed confirming continued abnormal extravasation of contrast from the marginal artery. This has an ovoid appearance and may represent pooling within a dependent diverticulum versus fusiform aneurysmal dilatation. The microcatheter was successfully advanced into the marginal  artery. Contrast demonstrates frank extravasation. Coil embolization was performed using a series of 2 and 3 mm fibered detachable interlock coils. Post embolization arteriography confirms complete occlusion of this vessel with no further contrast extravasation. No collateral filling. The microcatheter was brought back into the left colic artery and advanced into the descending division of the left colic artery to further evaluate the other potential site of bleeding. Arteriography was performed. No evidence of bleeding in the proximal descending colon. The microcatheter was readvanced into the ascending branch and out into the distal visceral branch that appeared to be the site of bleeding on the initial angiogram. Arteriography was performed and carried out to the venous phase. Again, there is no evidence of bleeding at this site. No definite vessel abnormality was identified. No embolization was performed at this site. The microcatheter and rim catheter were removed. A C2 cobra catheter was advanced over a Bentson wire into the abdominal aorta and used to select the celiac artery. A celiac arteriogram was performed. Normal arterial anatomy. No evidence of replaced middle colic artery. No active bleeding. The superior mesenteric artery was then catheterized. Arteriography was performed. No evidence of back bleeding from the middle colic artery where it anastomosis with the left colic artery. No additional sites of bleeding were identified. The catheter  was removed. A limited right common femoral arteriogram was performed. Hemostasis was attained with the assistance of an Angio-Seal device. IMPRESSION: 1. Positive angiography with potentially 2 sites of active bleeding in the region of the splenic flexure and proximal descending colon. 2. Successful coil embolization of the more definitive site of bleeding. 3. The second potential site of bleeding could not be identified on subsequent angiography and may have been artifactual. PLAN: 1. Patient is at risk for contrast induced nephropathy given baseline stage 3 chronic kidney disease. Recommend trending BUN and creatinine over the next 48 hours. Keep patient well hydrated. 2. Continue to trend H and H and transfuse as needed. 3. If there is evidence of recurrent or persistent active lower GI bleeding, repeat angiography should be considered. Signed, Sterling Big, MD [facility] Radiology Electronically Signed   By: Malachy Moan M.D.   On: 03/07/2017 09:37   Ir Angiogram Selective Each Additional Vessel  Result Date: 03/07/2017 INDICATION: 84 year old female with acute lower GI bleed with the site of bleeding localized to the region of the splenic flexure on tagged RBC scan. She presents for visceral angiography and possible embolization. EXAM: IR EMBO ARTERIAL NOT HEMORR HEMANG INC GUIDE ROADMAPPING; SELECTIVE VISCERAL ARTERIOGRAPHY; ADDITIONAL ARTERIOGRAPHY; IR ULTRASOUND GUIDANCE VASC ACCESS RIGHT 1. Ultrasound-guided vascular access, right common femoral artery 2. Celiac artery catheterization and angiogram (first order) 3. Superior mesenteric artery catheterization and angiogram (first order) 4. Inferior mesenteric artery catheterization and angiogram with additional sub selective arteriography as follows 5. Left colic artery catheterization and angiogram (second order) 6. Ascending branch of the left colic artery catheterization and angiogram  (third order) 7. Marginal artery catheterization and angiogram (third order, additional after basic) 8. Coil embolization of marginal artery 9. Catheterization of the descending branch of the left colic artery with angiogram (third order, additional after basic) 10. Catheterization of a distal branch of the ascending branch of the left colic artery with angiogram (third order, additional after basic) MEDICATIONS: None. ANESTHESIA/SEDATION: Moderate (conscious) sedation was employed during this procedure. A total of Versed 1.5 mg and Fentanyl 50 mcg was administered intravenously. Moderate Sedation Time: 44 minutes. The patient's level of  consciousness and vital signs were monitored continuously by radiology nursing throughout the procedure under my direct supervision. CONTRAST:  Approximately 75 mL Isovue 300 FLUOROSCOPY TIME:  Fluoroscopy Time: 13 minutes 24 seconds (907 mGy). COMPLICATIONS: None immediate. PROCEDURE: Informed consent was obtained from the patient following explanation of the procedure, risks, benefits and alternatives. The patient understands, agrees and consents for the procedure. All questions were addressed. A time out was performed prior to the initiation of the procedure. Maximal barrier sterile technique utilized including caps, mask, sterile gowns, sterile gloves, large sterile drape, hand hygiene, and Betadine prep. The right common femoral artery was interrogated with ultrasound and found to be widely patent. An image was obtained and stored for the medical record. Local anesthesia was attained by infiltration with 1% lidocaine. A small dermatotomy was made. Under real-time sonographic guidance, the vessel was punctured with a 21 gauge micropuncture needle. Using standard technique, the initial micro needle was exchanged over a 0.018 micro wire for a transitional 4 [place] micro sheath. The micro sheath was then exchanged over a 0.035 wire for a 5 French vascular sheath. A Bentson wire was  advanced in the abdominal aorta. A rim catheter was advanced over the wire. The rim catheter was used to select the inferior mesenteric artery. An arteriogram was performed. There is early nonvascular extravasation of contrast material arising from what appears to be the marginal artery at the splenic flexure. This corresponds with the site of bleeding seen on the recent tagged red blood cell study. Interestingly, there is a second site of apparent extravasation arising from a distal visceral branch of the ascending and division of the left colic artery in the proximal descending colon. This extravasation is less robust than the region of extravasation in the marginal artery. A renegade ST microcatheter was then advanced over a Fathom 16 wire in used to select the left colic artery. A left colic arteriogram was performed. The anatomy of the ascending and descending branches was successfully identified. There is persistent contrast extravasation from the marginal artery. No definite contrast extravasation noted on this second angiogram in the region of the descending colon. The catheter was further advanced into the ascending branch of the left colic artery. Arteriography was performed confirming continued abnormal extravasation of contrast from the marginal artery. This has an ovoid appearance and may represent pooling within a dependent diverticulum versus fusiform aneurysmal dilatation. The microcatheter was successfully advanced into the marginal artery. Contrast demonstrates frank extravasation. Coil embolization was performed using a series of 2 and 3 mm fibered detachable interlock coils. Post embolization arteriography confirms complete occlusion of this vessel with no further contrast extravasation. No collateral filling. The microcatheter was brought back into the left colic artery and advanced into the descending division of the left colic artery to further evaluate the other potential site of bleeding.  Arteriography was performed. No evidence of bleeding in the proximal descending colon. The microcatheter was readvanced into the ascending branch and out into the distal visceral branch that appeared to be the site of bleeding on the initial angiogram. Arteriography was performed and carried out to the venous phase. Again, there is no evidence of bleeding at this site. No definite vessel abnormality was identified. No embolization was performed at this site. The microcatheter and rim catheter were removed. A C2 cobra catheter was advanced over a Bentson wire into the abdominal aorta and used to select the celiac artery. A celiac arteriogram was performed. Normal arterial anatomy. No evidence of replaced middle  colic artery. No active bleeding. The superior mesenteric artery was then catheterized. Arteriography was performed. No evidence of back bleeding from the middle colic artery where it anastomosis with the left colic artery. No additional sites of bleeding were identified. The catheter was removed. A limited right common femoral arteriogram was performed. Hemostasis was attained with the assistance of an Angio-Seal device. IMPRESSION: 1. Positive angiography with potentially 2 sites of active bleeding in the region of the splenic flexure and proximal descending colon. 2. Successful coil embolization of the more definitive site of bleeding. 3. The second potential site of bleeding could not be identified on subsequent angiography and may have been artifactual. PLAN: 1. Patient is at risk for contrast induced nephropathy given baseline stage 3 chronic kidney disease. Recommend trending BUN and creatinine over the next 48 hours. Keep patient well hydrated. 2. Continue to trend H and H and transfuse as needed. 3. If there is evidence of recurrent or persistent active lower GI bleeding, repeat angiography should be considered. Signed, Sterling Big, MD [facility]  [facility] Radiology Electronically Signed   By: Malachy Moan M.D.   On: 03/07/2017 09:37   Ir Angiogram Selective Each Additional Vessel  Result Date: 03/07/2017 INDICATION: 84 year old female with acute lower GI bleed with the site of bleeding localized to the region of the splenic flexure on tagged RBC scan. She presents for visceral angiography and possible embolization. EXAM: IR EMBO ARTERIAL NOT HEMORR HEMANG INC GUIDE ROADMAPPING; SELECTIVE VISCERAL ARTERIOGRAPHY; ADDITIONAL ARTERIOGRAPHY; IR ULTRASOUND GUIDANCE VASC ACCESS RIGHT 1. Ultrasound-guided vascular access, right common femoral artery 2. Celiac artery catheterization and angiogram (first order) 3. Superior mesenteric artery catheterization and angiogram (first order) 4. Inferior mesenteric artery catheterization and angiogram with additional sub selective arteriography as follows 5. Left colic artery catheterization and angiogram (second order) 6. Ascending branch of the left colic artery catheterization and angiogram (third order) 7. Marginal artery catheterization and angiogram (third order, additional after basic) 8. Coil embolization of marginal artery 9. Catheterization of the descending branch of the left colic artery with angiogram (third order, additional after basic) 10. Catheterization of a distal branch of the ascending branch of the left colic artery with angiogram (third order, additional after basic) MEDICATIONS: None. ANESTHESIA/SEDATION: Moderate (conscious) sedation was employed during this procedure. A total of Versed 1.5 mg and Fentanyl 50 mcg was administered intravenously. Moderate Sedation Time: 44 minutes. The patient's level of consciousness and vital signs were monitored continuously by radiology nursing throughout the procedure under my direct supervision. CONTRAST:  Approximately 75 mL Isovue 300 FLUOROSCOPY TIME:  Fluoroscopy Time: 13 minutes 24 seconds (907 mGy). COMPLICATIONS: None immediate. PROCEDURE:  Informed consent was obtained from the patient following explanation of the procedure, risks, benefits and alternatives. The patient understands, agrees and consents for the procedure. All questions were addressed. A time out was performed prior to the initiation of the procedure. Maximal barrier sterile technique utilized including caps, mask, sterile gowns, sterile gloves, large sterile drape, hand hygiene, and Betadine prep. The right common femoral artery was interrogated with ultrasound and found to be widely patent. An image was obtained and stored for the medical record. Local anesthesia was attained by infiltration with 1% lidocaine. A small dermatotomy was made. Under real-time sonographic guidance, the vessel was punctured with a 21 gauge micropuncture needle. Using standard technique, the initial micro needle was exchanged over a 0.018 micro wire for a transitional 4 [place] micro sheath. The micro sheath was then exchanged over  a 0.035 wire for a 5 [place] vascular sheath. A Bentson wire was advanced in the abdominal aorta. A rim catheter was advanced over the wire. The rim catheter was used to select the inferior mesenteric artery. An arteriogram was performed. There is early nonvascular extravasation of contrast material arising from what appears to be the marginal artery at the splenic flexure. This corresponds with the site of bleeding seen on the recent tagged red blood cell study. Interestingly, there is a second site of apparent extravasation arising from a distal visceral branch of the ascending and division of the left colic artery in the proximal descending colon. This extravasation is less robust than the region of extravasation in the marginal artery. A renegade ST microcatheter was then advanced over a Fathom 16 wire in used to select the left colic artery. A left colic arteriogram was performed. The anatomy of the ascending and descending branches was successfully identified. There is  persistent contrast extravasation from the marginal artery. No definite contrast extravasation noted on this second angiogram in the region of the descending colon. The catheter was further advanced into the ascending branch of the left colic artery. Arteriography was performed confirming continued abnormal extravasation of contrast from the marginal artery. This has an ovoid appearance and may represent pooling within a dependent diverticulum versus fusiform aneurysmal dilatation. The microcatheter was successfully advanced into the marginal artery. Contrast demonstrates frank extravasation. Coil embolization was performed using a series of 2 and 3 mm fibered detachable interlock coils. Post embolization arteriography confirms complete occlusion of this vessel with no further contrast extravasation. No collateral filling. The microcatheter was brought back into the left colic artery and advanced into the descending division of the left colic artery to further evaluate the other potential site of bleeding. Arteriography was performed. No evidence of bleeding in the proximal descending colon. The microcatheter was readvanced into the ascending branch and out into the distal visceral branch that appeared to be the site of bleeding on the initial angiogram. Arteriography was performed and carried out to the venous phase. Again, there is no evidence of bleeding at this site. No definite vessel abnormality was identified. No embolization was performed at this site. The microcatheter and rim catheter were removed. A C2 cobra catheter was advanced over a Bentson wire into the abdominal aorta and used to select the celiac artery. A celiac arteriogram was performed. Normal arterial anatomy. No evidence of replaced middle colic artery. No active bleeding. The superior mesenteric artery was then catheterized. Arteriography was performed. No evidence of back bleeding from the middle colic artery where it anastomosis with the  left colic artery. No additional sites of bleeding were identified. The catheter was removed. A limited right common femoral arteriogram was performed. Hemostasis was attained with the assistance of an Angio-Seal device. IMPRESSION: 1. Positive angiography with potentially 2 sites of active bleeding in the region of the splenic flexure and proximal descending colon. 2. Successful coil embolization of the more definitive site of bleeding. 3. The second potential site of bleeding could not be identified on subsequent angiography and may have been artifactual. PLAN: 1. Patient is at risk for contrast induced nephropathy given baseline stage 3 chronic kidney disease. Recommend trending BUN and creatinine over the next 48 hours. Keep patient well hydrated. 2. Continue to trend H and H and transfuse as needed. 3. If there is evidence of recurrent or persistent active lower GI bleeding, repeat angiography should be considered. Signed, Sterling Big, MD [facility] Radiology Electronically Signed   By: Malachy Moan M.D.   On: 03/07/2017 09:37   Ir US Guide Vasc Access Right  Result Date: 03/07/2017 INDICATION: 84 year old female with acute lower GI bleed with the site of bleeding localized to the region of the splenic flexure on tagged RBC scan. She presents for visceral angiography and possible embolization. EXAM: IR EMBO ARTERIAL NOT HEMORR HEMANG INC GUIDE ROADMAPPING; SELECTIVE VISCERAL ARTERIOGRAPHY; ADDITIONAL ARTERIOGRAPHY; IR ULTRASOUND GUIDANCE VASC ACCESS RIGHT 1. Ultrasound-guided vascular access, right common femoral artery 2. Celiac artery catheterization and angiogram (first order) 3. Superior mesenteric artery catheterization and angiogram (first order) 4. Inferior mesenteric artery catheterization and angiogram with additional sub selective arteriography as follows 5. Left colic artery catheterization and angiogram (second order) 6. Ascending  branch of the left colic artery catheterization and angiogram (third order) 7. Marginal artery catheterization and angiogram (third order, additional after basic) 8. Coil embolization of marginal artery 9. Catheterization of the descending branch of the left colic artery with angiogram (third order, additional after basic) 10. Catheterization of a distal branch of the ascending branch of the left colic artery with angiogram (third order, additional after basic) MEDICATIONS: None. ANESTHESIA/SEDATION: Moderate (conscious) sedation was employed during this procedure. A total of Versed 1.5 mg and Fentanyl 50 mcg was administered intravenously. Moderate Sedation Time: 44 minutes. The patient's level of consciousness and vital signs were monitored continuously by radiology nursing throughout the procedure under my direct supervision. CONTRAST:  Approximately 75 mL Isovue 300 FLUOROSCOPY TIME:  Fluoroscopy Time: 13 minutes 24 seconds (907 mGy). COMPLICATIONS: None immediate. PROCEDURE: Informed consent was obtained from the patient following explanation of the procedure, risks, benefits and alternatives. The patient understands, agrees and consents for the procedure. All questions were addressed. A time out was performed prior to the initiation of the procedure. Maximal barrier sterile technique utilized including caps, mask, sterile gowns, sterile gloves, large sterile drape, hand hygiene, and Betadine prep. The right common femoral artery was interrogated with ultrasound and found to be widely patent. An image was obtained and stored for the medical record. Local anesthesia was attained by infiltration with 1% lidocaine. A small dermatotomy was made. Under real-time sonographic guidance, the vessel was punctured with a 21 gauge micropuncture needle. Using standard technique, the initial micro needle was exchanged over a 0.018 micro wire for a transitional 4 [place] micro sheath. The micro sheath was then exchanged over a  0.035 wire for a 5 French vascular sheath. A Bentson wire was advanced in the abdominal aorta. A rim catheter was advanced over the wire. The rim catheter was used to select the inferior mesenteric artery. An arteriogram was performed. There is early nonvascular extravasation of contrast material arising from what appears to be the marginal artery at the splenic flexure. This corresponds with the site of bleeding seen on the recent tagged red blood cell study. Interestingly, there is a second site of apparent extravasation arising from a distal visceral branch of the ascending and division of the left colic artery in the proximal descending colon. This extravasation is less robust than the region of extravasation in the marginal artery. A renegade ST microcatheter was then advanced over a Fathom 16 wire in used to select the left colic artery. A left colic arteriogram was performed. The anatomy of the ascending and descending branches was successfully identified. There is persistent contrast extravasation from the marginal artery. No definite contrast extravasation noted on this second angiogram in the region of  the descending colon. The catheter was further advanced into the ascending branch of the left colic artery. Arteriography was performed confirming continued abnormal extravasation of contrast from the marginal artery. This has an ovoid appearance and may represent pooling within a dependent diverticulum versus fusiform aneurysmal dilatation. The microcatheter was successfully advanced into the marginal artery. Contrast demonstrates frank extravasation. Coil embolization was performed using a series of 2 and 3 mm fibered detachable interlock coils. Post embolization arteriography confirms complete occlusion of this vessel with no further contrast extravasation. No collateral filling. The microcatheter was brought back into the left colic artery and advanced into the descending division of the left colic  artery to further evaluate the other potential site of bleeding. Arteriography was performed. No evidence of bleeding in the proximal descending colon. The microcatheter was readvanced into the ascending branch and out into the distal visceral branch that appeared to be the site of bleeding on the initial angiogram. Arteriography was performed and carried out to the venous phase. Again, there is no evidence of bleeding at this site. No definite vessel abnormality was identified. No embolization was performed at this site. The microcatheter and rim catheter were removed. A C2 cobra catheter was advanced over a Bentson wire into the abdominal aorta and used to select the celiac artery. A celiac arteriogram was performed. Normal arterial anatomy. No evidence of replaced middle colic artery. No active bleeding. The superior mesenteric artery was then catheterized. Arteriography was performed. No evidence of back bleeding from the middle colic artery where it anastomosis with the left colic artery. No additional sites of bleeding were identified. The catheter was removed. A limited right common femoral arteriogram was performed. Hemostasis was attained with the assistance of an Angio-Seal device. IMPRESSION: 1. Positive angiography with potentially 2 sites of active bleeding in the region of the splenic flexure and proximal descending colon. 2. Successful coil embolization of the more definitive site of bleeding. 3. The second potential site of bleeding could not be identified on subsequent angiography and may have been artifactual. PLAN: 1. Patient is at risk for contrast induced nephropathy given baseline stage 3 chronic kidney disease. Recommend trending BUN and creatinine over the next 48 hours. Keep patient well hydrated. 2. Continue to trend H and H and transfuse as needed. 3. If there is evidence of recurrent or persistent active lower GI bleeding, repeat angiography should be considered. Signed, Sterling Big, MD [facility] Radiology Electronically Signed   By: Malachy Moan M.D.   On: 03/07/2017 09:37   Ir Embo Arterial Not Hemorr [employer] Guide Roadmapping  Result Date: 03/07/2017 INDICATION: 84 year old female with acute lower GI bleed with the site of bleeding localized to the region of the splenic flexure on tagged RBC scan. She presents for visceral angiography and possible embolization. EXAM: IR EMBO ARTERIAL NOT HEMORR [employer] GUIDE ROADMAPPING; SELECTIVE VISCERAL ARTERIOGRAPHY; ADDITIONAL ARTERIOGRAPHY; IR ULTRASOUND GUIDANCE VASC ACCESS RIGHT 1. Ultrasound-guided vascular access, right common femoral artery 2. Celiac artery catheterization and angiogram (first order) 3. Superior mesenteric artery catheterization and angiogram (first order) 4. Inferior mesenteric artery catheterization and angiogram with additional sub selective arteriography as follows 5. Left colic artery catheterization and angiogram (second order) 6. Ascending branch of the left colic artery catheterization and angiogram (third order) 7. Marginal artery catheterization and angiogram (third order, additional after basic) 8. Coil embolization of marginal artery 9. Catheterization of the descending branch of the left colic artery with angiogram (third order,  additional after basic) 10. Catheterization of a distal branch of the ascending branch of the left colic artery with angiogram (third order, additional after basic) MEDICATIONS: None. ANESTHESIA/SEDATION: Moderate (conscious) sedation was employed during this procedure. A total of Versed 1.5 mg and Fentanyl 50 mcg was administered intravenously. Moderate Sedation Time: 44 minutes. The patient's level of consciousness and vital signs were monitored continuously by radiology nursing throughout the procedure under my direct supervision. CONTRAST:  Approximately 75 mL Isovue 300 FLUOROSCOPY TIME:  Fluoroscopy Time: 13  minutes 24 seconds (907 mGy). COMPLICATIONS: None immediate. PROCEDURE: Informed consent was obtained from the patient following explanation of the procedure, risks, benefits and alternatives. The patient understands, agrees and consents for the procedure. All questions were addressed. A time out was performed prior to the initiation of the procedure. Maximal barrier sterile technique utilized including caps, mask, sterile gowns, sterile gloves, large sterile drape, hand hygiene, and Betadine prep. The right common femoral artery was interrogated with ultrasound and found to be widely patent. An image was obtained and stored for the medical record. Local anesthesia was attained by infiltration with 1% lidocaine. A small dermatotomy was made. Under real-time sonographic guidance, the vessel was punctured with a 21 gauge micropuncture needle. Using standard technique, the initial micro needle was exchanged over a 0.018 micro wire for a transitional 4 [place] micro sheath. The micro sheath was then exchanged over a 0.035 wire for a 5 French vascular sheath. A Bentson wire was advanced in the abdominal aorta. A rim catheter was advanced over the wire. The rim catheter was used to select the inferior mesenteric artery. An arteriogram was performed. There is early nonvascular extravasation of contrast material arising from what appears to be the marginal artery at the splenic flexure. This corresponds with the site of bleeding seen on the recent tagged red blood cell study. Interestingly, there is a second site of apparent extravasation arising from a distal visceral branch of the ascending and division of the left colic artery in the proximal descending colon. This extravasation is less robust than the region of extravasation in the marginal artery. A renegade ST microcatheter was then advanced over a Fathom 16 wire in used to select the left colic artery. A left colic arteriogram was performed. The anatomy of the  ascending and descending branches was successfully identified. There is persistent contrast extravasation from the marginal artery. No definite contrast extravasation noted on this second angiogram in the region of the descending colon. The catheter was further advanced into the ascending branch of the left colic artery. Arteriography was performed confirming continued abnormal extravasation of contrast from the marginal artery. This has an ovoid appearance and may represent pooling within a dependent diverticulum versus fusiform aneurysmal dilatation. The microcatheter was successfully advanced into the marginal artery. Contrast demonstrates frank extravasation. Coil embolization was performed using a series of 2 and 3 mm fibered detachable interlock coils. Post embolization arteriography confirms complete occlusion of this vessel with no further contrast extravasation. No collateral filling. The microcatheter was brought back into the left colic artery and advanced into the descending division of the left colic artery to further evaluate the other potential site of bleeding. Arteriography was performed. No evidence of bleeding in the proximal descending colon. The microcatheter was readvanced into the ascending branch and out into the distal visceral branch that appeared to be the site of bleeding on the initial angiogram. Arteriography was performed and carried out to the venous phase. Again, there is no evidence of  bleeding at this site. No definite vessel abnormality was identified. No embolization was performed at this site. The microcatheter and rim catheter were removed. A C2 cobra catheter was advanced over a Bentson wire into the abdominal aorta and used to select the celiac artery. A celiac arteriogram was performed. Normal arterial anatomy. No evidence of replaced middle colic artery. No active bleeding. The superior mesenteric artery was then catheterized. Arteriography was performed. No evidence of  back bleeding from the middle colic artery where it anastomosis with the left colic artery. No additional sites of bleeding were identified. The catheter was removed. A limited right common femoral arteriogram was performed. Hemostasis was attained with the assistance of an Angio-Seal device. IMPRESSION: 1. Positive angiography with potentially 2 sites of active bleeding in the region of the splenic flexure and proximal descending colon. 2. Successful coil embolization of the more definitive site of bleeding. 3. The second potential site of bleeding could not be identified on subsequent angiography and may have been artifactual. PLAN: 1. Patient is at risk for contrast induced nephropathy given baseline stage 3 chronic kidney disease. Recommend trending BUN and creatinine over the next 48 hours. Keep patient well hydrated. 2. Continue to trend H and H and transfuse as needed. 3. If there is evidence of recurrent or persistent active lower GI bleeding, repeat angiography should be considered. Signed, Sterling Big, MD [facility] Radiology Electronically Signed   By: Malachy Moan M.D.   On: 03/07/2017 09:37    Assessment/Plan: Lower GI bleed S/p visceral angiogram with coil embolization of bleeding branch of the left colic artery.  Hemodynamically stable Hgb drifted to 8.1 but no further bleeding past 10 or so hours. Renal function stable. IR will continue to follow     LOS: 1 day   I spent a total of 15 minutes in face to face in clinical consultation, greater than 50% of which was counseling/coordinating care for visceral angio with embo for lower GI bleed  Brayton El PA-C 03/07/2017 1:01 PM

## 2017-03-07 NOTE — Progress Notes (Addendum)
Subjective: The patient was seen and examined at bedside. She underwent IR guided visceral angiogram with coil embolization of bleeding branch of left colonic artery today. Patient reports large amount of rectal bleeding at 9 PM yesterday evening and another small amount of rectal bleeding today morning at 3 AM. She has received 2 units of PRBC transfusion yesterday evening.   Objective: Vital signs in last 24 hours: Temp:  [97.5 F (36.4 C)-98.3 F (36.8 C)] 97.9 F (36.6 C) (01/24 0900) Pulse Rate:  [50-69] 62 (01/23 2330) Resp:  [8-24] 13 (01/24 0400) BP: (66-131)/(44-82) 129/74 (01/24 0400) SpO2:  [99 %-100 %] 100 % (01/23 2329) Weight:  [90.1 kg (198 lb 10.2 oz)-91 kg (200 lb 9.9 oz)] 91 kg (200 lb 9.9 oz) (01/24 0314) Weight change:  Last BM Date: 03/07/17  PE: Appears younger than stated age, mild pallor GENERAL: Not in acute distress ABDOMEN: Soft, nondistended, normoactive bowel sounds, nontender EXTREMITIES: Dressing over right groin, no obvious deformity  Lab Results: Results for orders placed or performed during the hospital encounter of 03/06/17 (from the past 48 hour(s))  Comprehensive metabolic panel     Status: Abnormal   Collection Time: 03/06/17  8:39 AM  Result Value Ref Range   Sodium 140 135 - 145 mmol/L   Potassium 4.0 3.5 - 5.1 mmol/L   Chloride 106 101 - 111 mmol/L   CO2 24 22 - 32 mmol/L   Glucose, Bld 117 (H) 65 - 99 mg/dL   BUN 27 (H) 6 - 20 mg/dL   Creatinine, Ser 1.41 (H) 0.44 - 1.00 mg/dL   Calcium 8.9 8.9 - 10.3 mg/dL   Total Protein 5.6 (L) 6.5 - 8.1 g/dL   Albumin 3.0 (L) 3.5 - 5.0 g/dL   AST 19 15 - 41 U/L   ALT 9 (L) 14 - 54 U/L   Alkaline Phosphatase 43 38 - 126 U/L   Total Bilirubin 0.4 0.3 - 1.2 mg/dL   GFR calc non Af Amer 32 (L) >60 mL/min   GFR calc Af Amer 37 (L) >60 mL/min    Comment: (NOTE) The eGFR has been calculated using the CKD EPI equation. This calculation has not been validated in all clinical situations. eGFR's  persistently <60 mL/min signify possible Chronic Kidney Disease.    Anion gap 10 5 - 15  CBC with Differential/Platelet     Status: Abnormal   Collection Time: 03/06/17  8:39 AM  Result Value Ref Range   WBC 4.5 4.0 - 10.5 K/uL   RBC 2.86 (L) 3.87 - 5.11 MIL/uL   Hemoglobin 8.7 (L) 12.0 - 15.0 g/dL   HCT 26.5 (L) 36.0 - 46.0 %   MCV 92.7 78.0 - 100.0 fL   MCH 30.4 26.0 - 34.0 pg   MCHC 32.8 30.0 - 36.0 g/dL   RDW 15.0 11.5 - 15.5 %   Platelets 243 150 - 400 K/uL   Neutrophils Relative % 67 %   Neutro Abs 3.0 1.7 - 7.7 K/uL   Lymphocytes Relative 21 %   Lymphs Abs 0.9 0.7 - 4.0 K/uL   Monocytes Relative 9 %   Monocytes Absolute 0.4 0.1 - 1.0 K/uL   Eosinophils Relative 3 %   Eosinophils Absolute 0.1 0.0 - 0.7 K/uL   Basophils Relative 0 %   Basophils Absolute 0.0 0.0 - 0.1 K/uL  POC occult blood, ED     Status: Abnormal   Collection Time: 03/06/17  8:44 AM  Result Value Ref Range   Fecal  Occult Bld POSITIVE (A) NEGATIVE  Type and screen [place]     Status: None   Collection Time: 03/06/17  8:45 AM  Result Value Ref Range   ABO/RH(D) B POS    Antibody Screen NEG    Sample Expiration 03/09/2017    Unit Number [id number]    Blood Component Type RED CELLS,LR    Unit division 00    Status of Unit ISSUED,FINAL    Transfusion Status OK TO TRANSFUSE    Crossmatch Result Compatible    Unit Number [id number]    Blood Component Type RED CELLS,LR    Unit division 00    Status of Unit ISSUED,FINAL    Transfusion Status OK TO TRANSFUSE    Crossmatch Result Compatible   Prepare RBC     Status: None   Collection Time: 03/06/17 10:29 AM  Result Value Ref Range   Order Confirmation ORDER PROCESSED BY BLOOD BANK   MRSA PCR Screening     Status: None   Collection Time: 03/06/17  2:14 PM  Result Value Ref Range   MRSA by PCR NEGATIVE NEGATIVE    Comment:        The GeneXpert MRSA Assay (FDA approved for NASAL specimens only), is one component of  a comprehensive MRSA colonization surveillance program. It is not intended to diagnose MRSA infection nor to guide or monitor treatment for MRSA infections.   CBC     Status: Abnormal   Collection Time: 03/07/17  3:10 AM  Result Value Ref Range   WBC 8.2 4.0 - 10.5 K/uL   RBC 2.82 (L) 3.87 - 5.11 MIL/uL   Hemoglobin 8.6 (L) 12.0 - 15.0 g/dL   HCT 25.7 (L) 36.0 - 46.0 %   MCV 91.1 78.0 - 100.0 fL   MCH 30.5 26.0 - 34.0 pg   MCHC 33.5 30.0 - 36.0 g/dL   RDW 15.5 11.5 - 15.5 %   Platelets 147 (L) 150 - 400 K/uL  Comprehensive metabolic panel     Status: Abnormal   Collection Time: 03/07/17  7:45 AM  Result Value Ref Range   Sodium 141 135 - 145 mmol/L   Potassium 4.1 3.5 - 5.1 mmol/L   Chloride 111 101 - 111 mmol/L   CO2 22 22 - 32 mmol/L   Glucose, Bld 101 (H) 65 - 99 mg/dL   BUN 20 6 - 20 mg/dL   Creatinine, Ser 1.22 (H) 0.44 - 1.00 mg/dL   Calcium 8.1 (L) 8.9 - 10.3 mg/dL   Total Protein 4.6 (L) 6.5 - 8.1 g/dL   Albumin 2.5 (L) 3.5 - 5.0 g/dL   AST 17 15 - 41 U/L   ALT 7 (L) 14 - 54 U/L   Alkaline Phosphatase 36 (L) 38 - 126 U/L   Total Bilirubin 0.6 0.3 - 1.2 mg/dL   GFR calc non Af Amer 38 (L) >60 mL/min   GFR calc Af Amer 44 (L) >60 mL/min    Comment: (NOTE) The eGFR has been calculated using the CKD EPI equation. This calculation has not been validated in all clinical situations. eGFR's persistently <60 mL/min signify possible Chronic Kidney Disease.    Anion gap 8 5 - 15  CBC     Status: Abnormal   Collection Time: 03/07/17  7:45 AM  Result Value Ref Range   WBC 8.2 4.0 - 10.5 K/uL   RBC 2.73 (L) 3.87 - 5.11 MIL/uL   Hemoglobin 8.1 (L) 12.0 - 15.0 g/dL  HCT 24.4 (L) 36.0 - 46.0 %   MCV 89.4 78.0 - 100.0 fL   MCH 29.7 26.0 - 34.0 pg   MCHC 33.2 30.0 - 36.0 g/dL   RDW 15.3 11.5 - 15.5 %   Platelets 143 (L) 150 - 400 K/uL    Studies/Results: Nm Gi Blood Loss  Result Date: 03/06/2017 CLINICAL DATA:  84 year old female with GI bleeding, anemia. EXAM:  NUCLEAR MEDICINE GASTROINTESTINAL BLEEDING SCAN TECHNIQUE: Sequential abdominal images were obtained following intravenous administration of Tc-64mlabeled red blood cells. RADIOPHARMACEUTICALS:  25.4 mCi Tc-934mertechnetate in-vitro labeled red cells. COMPARISON:  CT Abdomen and Pelvis 03/08/2015 FINDINGS: Physiologic blood pool activity is present, but almost immediately there is abnormal radiotracer activity within bowel loops of the left abdomen. This appears to originate at the splenic flexure, and then transit is in both the distal transverse colon and the descending colon to the sigmoid region. Widespread diverticulosis in the large bowel on the comparison CT Abdomen and Pelvis. IMPRESSION: Positive for active GI bleeding. Bleeding origin appears to be the splenic flexure of colon. Critical Value/emergent results were called by telephone at the time of interpretation on 03/06/2017 at 1347 hr to Dr. ChJamse Arnn the ED, who verbally acknowledged these results. Electronically Signed   By: H Genevie Ann.D.   On: 03/06/2017 14:04   Ir Angiogram Visceral Selective  Result Date: 03/07/2017 INDICATION: [age]ear old female with acute lower GI bleed with the site of bleeding localized to the region of the splenic flexure on tagged RBC scan. She presents for visceral angiography and possible embolization. EXAM: IR EMBO ARTERIAL NOT HEMORR HEMANG INC GUIDE ROADMAPPING; SELECTIVE VISCERAL ARTERIOGRAPHY; ADDITIONAL ARTERIOGRAPHY; IR ULTRASOUND GUIDANCE VASC ACCESS RIGHT 1. Ultrasound-guided vascular access, right common femoral artery 2. Celiac artery catheterization and angiogram (first order) 3. Superior mesenteric artery catheterization and angiogram (first order) 4. Inferior mesenteric artery catheterization and angiogram with additional sub selective arteriography as follows 5. Left colic artery catheterization and angiogram (second order) 6. Ascending branch of the left colic artery catheterization and angiogram  (third order) 7. Marginal artery catheterization and angiogram (third order, additional after basic) 8. Coil embolization of marginal artery 9. Catheterization of the descending branch of the left colic artery with angiogram (third order, additional after basic) 10. Catheterization of a distal branch of the ascending branch of the left colic artery with angiogram (third order, additional after basic) MEDICATIONS: None. ANESTHESIA/SEDATION: Moderate (conscious) sedation was employed during this procedure. A total of Versed 1.5 mg and Fentanyl 50 mcg was administered intravenously. Moderate Sedation Time: 44 minutes. The patient's level of consciousness and vital signs were monitored continuously by radiology nursing throughout the procedure under my direct supervision. CONTRAST:  Approximately 75 mL Isovue 300 FLUOROSCOPY TIME:  Fluoroscopy Time: 13 minutes 24 seconds (907 mGy). COMPLICATIONS: None immediate. PROCEDURE: Informed consent was obtained from the patient following explanation of the procedure, risks, benefits and alternatives. The patient understands, agrees and consents for the procedure. All questions were addressed. A time out was performed prior to the initiation of the procedure. Maximal barrier sterile technique utilized including caps, mask, sterile gowns, sterile gloves, large sterile drape, hand hygiene, and Betadine prep. The right common femoral artery was interrogated with ultrasound and found to be widely patent. An image was obtained and stored for the medical record. Local anesthesia was attained by infiltration with 1% lidocaine. A small dermatotomy was made. Under real-time sonographic guidance, the vessel was punctured with a 21 gauge micropuncture needle. Using standard technique, the  initial micro needle was exchanged over a 0.018 micro wire for a transitional 4 [place] micro sheath. The micro sheath was then exchanged over a 0.035 wire for a 5 French vascular sheath. A Bentson wire was  advanced in the abdominal aorta. A rim catheter was advanced over the wire. The rim catheter was used to select the inferior mesenteric artery. An arteriogram was performed. There is early nonvascular extravasation of contrast material arising from what appears to be the marginal artery at the splenic flexure. This corresponds with the site of bleeding seen on the recent tagged red blood cell study. Interestingly, there is a second site of apparent extravasation arising from a distal visceral branch of the ascending and division of the left colic artery in the proximal descending colon. This extravasation is less robust than the region of extravasation in the marginal artery. A renegade ST microcatheter was then advanced over a Fathom 16 wire in used to select the left colic artery. A left colic arteriogram was performed. The anatomy of the ascending and descending branches was successfully identified. There is persistent contrast extravasation from the marginal artery. No definite contrast extravasation noted on this second angiogram in the region of the descending colon. The catheter was further advanced into the ascending branch of the left colic artery. Arteriography was performed confirming continued abnormal extravasation of contrast from the marginal artery. This has an ovoid appearance and may represent pooling within a dependent diverticulum versus fusiform aneurysmal dilatation. The microcatheter was successfully advanced into the marginal artery. Contrast demonstrates frank extravasation. Coil embolization was performed using a series of 2 and 3 mm fibered detachable interlock coils. Post embolization arteriography confirms complete occlusion of this vessel with no further contrast extravasation. No collateral filling. The microcatheter was brought back into the left colic artery and advanced into the descending division of the left colic artery to further evaluate the other potential site of bleeding.  Arteriography was performed. No evidence of bleeding in the proximal descending colon. The microcatheter was readvanced into the ascending branch and out into the distal visceral branch that appeared to be the site of bleeding on the initial angiogram. Arteriography was performed and carried out to the venous phase. Again, there is no evidence of bleeding at this site. No definite vessel abnormality was identified. No embolization was performed at this site. The microcatheter and rim catheter were removed. A C2 cobra catheter was advanced over a Bentson wire into the abdominal aorta and used to select the celiac artery. A celiac arteriogram was performed. Normal arterial anatomy. No evidence of replaced middle colic artery. No active bleeding. The superior mesenteric artery was then catheterized. Arteriography was performed. No evidence of back bleeding from the middle colic artery where it anastomosis with the left colic artery. No additional sites of bleeding were identified. The catheter was removed. A limited right common femoral arteriogram was performed. Hemostasis was attained with the assistance of an Angio-Seal device. IMPRESSION: 1. Positive angiography with potentially 2 sites of active bleeding in the region of the splenic flexure and proximal descending colon. 2. Successful coil embolization of the more definitive site of bleeding. 3. The second potential site of bleeding could not be identified on subsequent angiography and may have been artifactual. PLAN: 1. Patient is at risk for contrast induced nephropathy given baseline stage 3 chronic kidney disease. Recommend trending BUN and creatinine over the next 48 hours. Keep patient well hydrated. 2. Continue to trend H and H and transfuse as needed. 3.  If there is evidence of recurrent or persistent active lower GI bleeding, repeat angiography should be considered. Signed, Criselda Peaches, MD [facility]  [facility] Radiology Electronically Signed   By: Jacqulynn Cadet M.D.   On: 03/07/2017 09:37   Ir Angiogram Visceral Selective  Result Date: 03/07/2017 INDICATION: 84 year old female with acute lower GI bleed with the site of bleeding localized to the region of the splenic flexure on tagged RBC scan. She presents for visceral angiography and possible embolization. EXAM: IR EMBO ARTERIAL NOT HEMORR HEMANG INC GUIDE ROADMAPPING; SELECTIVE VISCERAL ARTERIOGRAPHY; ADDITIONAL ARTERIOGRAPHY; IR ULTRASOUND GUIDANCE VASC ACCESS RIGHT 1. Ultrasound-guided vascular access, right common femoral artery 2. Celiac artery catheterization and angiogram (first order) 3. Superior mesenteric artery catheterization and angiogram (first order) 4. Inferior mesenteric artery catheterization and angiogram with additional sub selective arteriography as follows 5. Left colic artery catheterization and angiogram (second order) 6. Ascending branch of the left colic artery catheterization and angiogram (third order) 7. Marginal artery catheterization and angiogram (third order, additional after basic) 8. Coil embolization of marginal artery 9. Catheterization of the descending branch of the left colic artery with angiogram (third order, additional after basic) 10. Catheterization of a distal branch of the ascending branch of the left colic artery with angiogram (third order, additional after basic) MEDICATIONS: None. ANESTHESIA/SEDATION: Moderate (conscious) sedation was employed during this procedure. A total of Versed 1.5 mg and Fentanyl 50 mcg was administered intravenously. Moderate Sedation Time: 44 minutes. The patient's level of consciousness and vital signs were monitored continuously by radiology nursing throughout the procedure under my direct supervision. CONTRAST:  Approximately 75 mL Isovue 300 FLUOROSCOPY TIME:  Fluoroscopy Time: 13 minutes 24 seconds (907 mGy). COMPLICATIONS: None immediate. PROCEDURE: Informed consent was  obtained from the patient following explanation of the procedure, risks, benefits and alternatives. The patient understands, agrees and consents for the procedure. All questions were addressed. A time out was performed prior to the initiation of the procedure. Maximal barrier sterile technique utilized including caps, mask, sterile gowns, sterile gloves, large sterile drape, hand hygiene, and Betadine prep. The right common femoral artery was interrogated with ultrasound and found to be widely patent. An image was obtained and stored for the medical record. Local anesthesia was attained by infiltration with 1% lidocaine. A small dermatotomy was made. Under real-time sonographic guidance, the vessel was punctured with a 21 gauge micropuncture needle. Using standard technique, the initial micro needle was exchanged over a 0.018 micro wire for a transitional 4 [place] micro sheath. The micro sheath was then exchanged over a 0.035 wire for a 5 French vascular sheath. A Bentson wire was advanced in the abdominal aorta. A rim catheter was advanced over the wire. The rim catheter was used to select the inferior mesenteric artery. An arteriogram was performed. There is early nonvascular extravasation of contrast material arising from what appears to be the marginal artery at the splenic flexure. This corresponds with the site of bleeding seen on the recent tagged red blood cell study. Interestingly, there is a second site of apparent extravasation arising from a distal visceral branch of the ascending and division of the left colic artery in the proximal descending colon. This extravasation is less robust than the region of extravasation in the marginal artery. A renegade ST microcatheter was then advanced over a Fathom 16 wire in used to select the left colic artery. A left colic arteriogram was performed. The anatomy of the ascending and descending branches was successfully identified.  There is persistent contrast  extravasation from the marginal artery. No definite contrast extravasation noted on this second angiogram in the region of the descending colon. The catheter was further advanced into the ascending branch of the left colic artery. Arteriography was performed confirming continued abnormal extravasation of contrast from the marginal artery. This has an ovoid appearance and may represent pooling within a dependent diverticulum versus fusiform aneurysmal dilatation. The microcatheter was successfully advanced into the marginal artery. Contrast demonstrates frank extravasation. Coil embolization was performed using a series of 2 and 3 mm fibered detachable interlock coils. Post embolization arteriography confirms complete occlusion of this vessel with no further contrast extravasation. No collateral filling. The microcatheter was brought back into the left colic artery and advanced into the descending division of the left colic artery to further evaluate the other potential site of bleeding. Arteriography was performed. No evidence of bleeding in the proximal descending colon. The microcatheter was readvanced into the ascending branch and out into the distal visceral branch that appeared to be the site of bleeding on the initial angiogram. Arteriography was performed and carried out to the venous phase. Again, there is no evidence of bleeding at this site. No definite vessel abnormality was identified. No embolization was performed at this site. The microcatheter and rim catheter were removed. A C2 cobra catheter was advanced over a Bentson wire into the abdominal aorta and used to select the celiac artery. A celiac arteriogram was performed. Normal arterial anatomy. No evidence of replaced middle colic artery. No active bleeding. The superior mesenteric artery was then catheterized. Arteriography was performed. No evidence of back bleeding from the middle colic artery where it anastomosis with the left colic artery. No  additional sites of bleeding were identified. The catheter was removed. A limited right common femoral arteriogram was performed. Hemostasis was attained with the assistance of an Angio-Seal device. IMPRESSION: 1. Positive angiography with potentially 2 sites of active bleeding in the region of the splenic flexure and proximal descending colon. 2. Successful coil embolization of the more definitive site of bleeding. 3. The second potential site of bleeding could not be identified on subsequent angiography and may have been artifactual. PLAN: 1. Patient is at risk for contrast induced nephropathy given baseline stage 3 chronic kidney disease. Recommend trending BUN and creatinine over the next 48 hours. Keep patient well hydrated. 2. Continue to trend H and H and transfuse as needed. 3. If there is evidence of recurrent or persistent active lower GI bleeding, repeat angiography should be considered. Signed, Criselda Peaches, MD [facility] Radiology Electronically Signed   By: Jacqulynn Cadet M.D.   On: 03/07/2017 09:37   Ir Angiogram Visceral Selective  Result Date: 03/07/2017 INDICATION: 84 year old female with acute lower GI bleed with the site of bleeding localized to the region of the splenic flexure on tagged RBC scan. She presents for visceral angiography and possible embolization. EXAM: IR EMBO ARTERIAL NOT HEMORR HEMANG INC GUIDE ROADMAPPING; SELECTIVE VISCERAL ARTERIOGRAPHY; ADDITIONAL ARTERIOGRAPHY; IR ULTRASOUND GUIDANCE VASC ACCESS RIGHT 1. Ultrasound-guided vascular access, right common femoral artery 2. Celiac artery catheterization and angiogram (first order) 3. Superior mesenteric artery catheterization and angiogram (first order) 4. Inferior mesenteric artery catheterization and angiogram with additional sub selective arteriography as follows 5. Left colic artery catheterization and angiogram (second order) 6. Ascending branch of the left  colic artery catheterization and angiogram (third order) 7. Marginal artery catheterization and angiogram (third order, additional after basic) 8. Coil embolization  of marginal artery 9. Catheterization of the descending branch of the left colic artery with angiogram (third order, additional after basic) 10. Catheterization of a distal branch of the ascending branch of the left colic artery with angiogram (third order, additional after basic) MEDICATIONS: None. ANESTHESIA/SEDATION: Moderate (conscious) sedation was employed during this procedure. A total of Versed 1.5 mg and Fentanyl 50 mcg was administered intravenously. Moderate Sedation Time: 44 minutes. The patient's level of consciousness and vital signs were monitored continuously by radiology nursing throughout the procedure under my direct supervision. CONTRAST:  Approximately 75 mL Isovue 300 FLUOROSCOPY TIME:  Fluoroscopy Time: 13 minutes 24 seconds (907 mGy). COMPLICATIONS: None immediate. PROCEDURE: Informed consent was obtained from the patient following explanation of the procedure, risks, benefits and alternatives. The patient understands, agrees and consents for the procedure. All questions were addressed. A time out was performed prior to the initiation of the procedure. Maximal barrier sterile technique utilized including caps, mask, sterile gowns, sterile gloves, large sterile drape, hand hygiene, and Betadine prep. The right common femoral artery was interrogated with ultrasound and found to be widely patent. An image was obtained and stored for the medical record. Local anesthesia was attained by infiltration with 1% lidocaine. A small dermatotomy was made. Under real-time sonographic guidance, the vessel was punctured with a 21 gauge micropuncture needle. Using standard technique, the initial micro needle was exchanged over a 0.018 micro wire for a transitional 4 [place] micro sheath. The micro sheath was then exchanged over a 0.035 wire for a 5  French vascular sheath. A Bentson wire was advanced in the abdominal aorta. A rim catheter was advanced over the wire. The rim catheter was used to select the inferior mesenteric artery. An arteriogram was performed. There is early nonvascular extravasation of contrast material arising from what appears to be the marginal artery at the splenic flexure. This corresponds with the site of bleeding seen on the recent tagged red blood cell study. Interestingly, there is a second site of apparent extravasation arising from a distal visceral branch of the ascending and division of the left colic artery in the proximal descending colon. This extravasation is less robust than the region of extravasation in the marginal artery. A renegade ST microcatheter was then advanced over a Fathom 16 wire in used to select the left colic artery. A left colic arteriogram was performed. The anatomy of the ascending and descending branches was successfully identified. There is persistent contrast extravasation from the marginal artery. No definite contrast extravasation noted on this second angiogram in the region of the descending colon. The catheter was further advanced into the ascending branch of the left colic artery. Arteriography was performed confirming continued abnormal extravasation of contrast from the marginal artery. This has an ovoid appearance and may represent pooling within a dependent diverticulum versus fusiform aneurysmal dilatation. The microcatheter was successfully advanced into the marginal artery. Contrast demonstrates frank extravasation. Coil embolization was performed using a series of 2 and 3 mm fibered detachable interlock coils. Post embolization arteriography confirms complete occlusion of this vessel with no further contrast extravasation. No collateral filling. The microcatheter was brought back into the left colic artery and advanced into the descending division of the left colic artery to further  evaluate the other potential site of bleeding. Arteriography was performed. No evidence of bleeding in the proximal descending colon. The microcatheter was readvanced into the ascending branch and out into the distal visceral branch that appeared to be the site of bleeding on the  initial angiogram. Arteriography was performed and carried out to the venous phase. Again, there is no evidence of bleeding at this site. No definite vessel abnormality was identified. No embolization was performed at this site. The microcatheter and rim catheter were removed. A C2 cobra catheter was advanced over a Bentson wire into the abdominal aorta and used to select the celiac artery. A celiac arteriogram was performed. Normal arterial anatomy. No evidence of replaced middle colic artery. No active bleeding. The superior mesenteric artery was then catheterized. Arteriography was performed. No evidence of back bleeding from the middle colic artery where it anastomosis with the left colic artery. No additional sites of bleeding were identified. The catheter was removed. A limited right common femoral arteriogram was performed. Hemostasis was attained with the assistance of an Angio-Seal device. IMPRESSION: 1. Positive angiography with potentially 2 sites of active bleeding in the region of the splenic flexure and proximal descending colon. 2. Successful coil embolization of the more definitive site of bleeding. 3. The second potential site of bleeding could not be identified on subsequent angiography and may have been artifactual. PLAN: 1. Patient is at risk for contrast induced nephropathy given baseline stage 3 chronic kidney disease. Recommend trending BUN and creatinine over the next 48 hours. Keep patient well hydrated. 2. Continue to trend H and H and transfuse as needed. 3. If there is evidence of recurrent or persistent active lower GI bleeding, repeat angiography should be considered. Signed, Criselda Peaches, MD [facility] Radiology Electronically Signed   By: Jacqulynn Cadet M.D.   On: 03/07/2017 09:37   Ir Angiogram Selective Each Additional Vessel  Result Date: 03/07/2017 INDICATION: 84 year old female with acute lower GI bleed with the site of bleeding localized to the region of the splenic flexure on tagged RBC scan. She presents for visceral angiography and possible embolization. EXAM: IR EMBO ARTERIAL NOT HEMORR HEMANG INC GUIDE ROADMAPPING; SELECTIVE VISCERAL ARTERIOGRAPHY; ADDITIONAL ARTERIOGRAPHY; IR ULTRASOUND GUIDANCE VASC ACCESS RIGHT 1. Ultrasound-guided vascular access, right common femoral artery 2. Celiac artery catheterization and angiogram (first order) 3. Superior mesenteric artery catheterization and angiogram (first order) 4. Inferior mesenteric artery catheterization and angiogram with additional sub selective arteriography as follows 5. Left colic artery catheterization and angiogram (second order) 6. Ascending branch of the left colic artery catheterization and angiogram (third order) 7. Marginal artery catheterization and angiogram (third order, additional after basic) 8. Coil embolization of marginal artery 9. Catheterization of the descending branch of the left colic artery with angiogram (third order, additional after basic) 10. Catheterization of a distal branch of the ascending branch of the left colic artery with angiogram (third order, additional after basic) MEDICATIONS: None. ANESTHESIA/SEDATION: Moderate (conscious) sedation was employed during this procedure. A total of Versed 1.5 mg and Fentanyl 50 mcg was administered intravenously. Moderate Sedation Time: 44 minutes. The patient's level of consciousness and vital signs were monitored continuously by radiology nursing throughout the procedure under my direct supervision. CONTRAST:  Approximately 75 mL Isovue 300 FLUOROSCOPY TIME:  Fluoroscopy Time: 13 minutes 24 seconds (907 mGy).  COMPLICATIONS: None immediate. PROCEDURE: Informed consent was obtained from the patient following explanation of the procedure, risks, benefits and alternatives. The patient understands, agrees and consents for the procedure. All questions were addressed. A time out was performed prior to the initiation of the procedure. Maximal barrier sterile technique utilized including caps, mask, sterile gowns, sterile gloves, large sterile drape, hand hygiene, and Betadine prep. The right common femoral artery  was interrogated with ultrasound and found to be widely patent. An image was obtained and stored for the medical record. Local anesthesia was attained by infiltration with 1% lidocaine. A small dermatotomy was made. Under real-time sonographic guidance, the vessel was punctured with a 21 gauge micropuncture needle. Using standard technique, the initial micro needle was exchanged over a 0.018 micro wire for a transitional 4 [place] micro sheath. The micro sheath was then exchanged over a 0.035 wire for a 5 French vascular sheath. A Bentson wire was advanced in the abdominal aorta. A rim catheter was advanced over the wire. The rim catheter was used to select the inferior mesenteric artery. An arteriogram was performed. There is early nonvascular extravasation of contrast material arising from what appears to be the marginal artery at the splenic flexure. This corresponds with the site of bleeding seen on the recent tagged red blood cell study. Interestingly, there is a second site of apparent extravasation arising from a distal visceral branch of the ascending and division of the left colic artery in the proximal descending colon. This extravasation is less robust than the region of extravasation in the marginal artery. A renegade ST microcatheter was then advanced over a Fathom 16 wire in used to select the left colic artery. A left colic arteriogram was performed. The anatomy of the ascending and descending branches was  successfully identified. There is persistent contrast extravasation from the marginal artery. No definite contrast extravasation noted on this second angiogram in the region of the descending colon. The catheter was further advanced into the ascending branch of the left colic artery. Arteriography was performed confirming continued abnormal extravasation of contrast from the marginal artery. This has an ovoid appearance and may represent pooling within a dependent diverticulum versus fusiform aneurysmal dilatation. The microcatheter was successfully advanced into the marginal artery. Contrast demonstrates frank extravasation. Coil embolization was performed using a series of 2 and 3 mm fibered detachable interlock coils. Post embolization arteriography confirms complete occlusion of this vessel with no further contrast extravasation. No collateral filling. The microcatheter was brought back into the left colic artery and advanced into the descending division of the left colic artery to further evaluate the other potential site of bleeding. Arteriography was performed. No evidence of bleeding in the proximal descending colon. The microcatheter was readvanced into the ascending branch and out into the distal visceral branch that appeared to be the site of bleeding on the initial angiogram. Arteriography was performed and carried out to the venous phase. Again, there is no evidence of bleeding at this site. No definite vessel abnormality was identified. No embolization was performed at this site. The microcatheter and rim catheter were removed. A C2 cobra catheter was advanced over a Bentson wire into the abdominal aorta and used to select the celiac artery. A celiac arteriogram was performed. Normal arterial anatomy. No evidence of replaced middle colic artery. No active bleeding. The superior mesenteric artery was then catheterized. Arteriography was performed. No evidence of back bleeding from the middle colic  artery where it anastomosis with the left colic artery. No additional sites of bleeding were identified. The catheter was removed. A limited right common femoral arteriogram was performed. Hemostasis was attained with the assistance of an Angio-Seal device. IMPRESSION: 1. Positive angiography with potentially 2 sites of active bleeding in the region of the splenic flexure and proximal descending colon. 2. Successful coil embolization of the more definitive site of bleeding. 3. The second potential site of bleeding could not be  identified on subsequent angiography and may have been artifactual. PLAN: 1. Patient is at risk for contrast induced nephropathy given baseline stage 3 chronic kidney disease. Recommend trending BUN and creatinine over the next 48 hours. Keep patient well hydrated. 2. Continue to trend H and H and transfuse as needed. 3. If there is evidence of recurrent or persistent active lower GI bleeding, repeat angiography should be considered. Signed, Criselda Peaches, MD [facility] Radiology Electronically Signed   By: Jacqulynn Cadet M.D.   On: 03/07/2017 09:37   Ir Angiogram Selective Each Additional Vessel  Result Date: 03/07/2017 INDICATION: 84 year old female with acute lower GI bleed with the site of bleeding localized to the region of the splenic flexure on tagged RBC scan. She presents for visceral angiography and possible embolization. EXAM: IR EMBO ARTERIAL NOT HEMORR HEMANG INC GUIDE ROADMAPPING; SELECTIVE VISCERAL ARTERIOGRAPHY; ADDITIONAL ARTERIOGRAPHY; IR ULTRASOUND GUIDANCE VASC ACCESS RIGHT 1. Ultrasound-guided vascular access, right common femoral artery 2. Celiac artery catheterization and angiogram (first order) 3. Superior mesenteric artery catheterization and angiogram (first order) 4. Inferior mesenteric artery catheterization and angiogram with additional sub selective arteriography as follows 5. Left colic artery  catheterization and angiogram (second order) 6. Ascending branch of the left colic artery catheterization and angiogram (third order) 7. Marginal artery catheterization and angiogram (third order, additional after basic) 8. Coil embolization of marginal artery 9. Catheterization of the descending branch of the left colic artery with angiogram (third order, additional after basic) 10. Catheterization of a distal branch of the ascending branch of the left colic artery with angiogram (third order, additional after basic) MEDICATIONS: None. ANESTHESIA/SEDATION: Moderate (conscious) sedation was employed during this procedure. A total of Versed 1.5 mg and Fentanyl 50 mcg was administered intravenously. Moderate Sedation Time: 44 minutes. The patient's level of consciousness and vital signs were monitored continuously by radiology nursing throughout the procedure under my direct supervision. CONTRAST:  Approximately 75 mL Isovue 300 FLUOROSCOPY TIME:  Fluoroscopy Time: 13 minutes 24 seconds (907 mGy). COMPLICATIONS: None immediate. PROCEDURE: Informed consent was obtained from the patient following explanation of the procedure, risks, benefits and alternatives. The patient understands, agrees and consents for the procedure. All questions were addressed. A time out was performed prior to the initiation of the procedure. Maximal barrier sterile technique utilized including caps, mask, sterile gowns, sterile gloves, large sterile drape, hand hygiene, and Betadine prep. The right common femoral artery was interrogated with ultrasound and found to be widely patent. An image was obtained and stored for the medical record. Local anesthesia was attained by infiltration with 1% lidocaine. A small dermatotomy was made. Under real-time sonographic guidance, the vessel was punctured with a 21 gauge micropuncture needle. Using standard technique, the initial micro needle was exchanged over a 0.018 micro wire for a transitional 4  [place] micro sheath. The micro sheath was then exchanged over a 0.035 wire for a 5 French vascular sheath. A Bentson wire was advanced in the abdominal aorta. A rim catheter was advanced over the wire. The rim catheter was used to select the inferior mesenteric artery. An arteriogram was performed. There is early nonvascular extravasation of contrast material arising from what appears to be the marginal artery at the splenic flexure. This corresponds with the site of bleeding seen on the recent tagged red blood cell study. Interestingly, there is a second site of apparent extravasation arising from a distal visceral branch of the ascending and division of the left colic artery in the  proximal descending colon. This extravasation is less robust than the region of extravasation in the marginal artery. A renegade ST microcatheter was then advanced over a Fathom 16 wire in used to select the left colic artery. A left colic arteriogram was performed. The anatomy of the ascending and descending branches was successfully identified. There is persistent contrast extravasation from the marginal artery. No definite contrast extravasation noted on this second angiogram in the region of the descending colon. The catheter was further advanced into the ascending branch of the left colic artery. Arteriography was performed confirming continued abnormal extravasation of contrast from the marginal artery. This has an ovoid appearance and may represent pooling within a dependent diverticulum versus fusiform aneurysmal dilatation. The microcatheter was successfully advanced into the marginal artery. Contrast demonstrates frank extravasation. Coil embolization was performed using a series of 2 and 3 mm fibered detachable interlock coils. Post embolization arteriography confirms complete occlusion of this vessel with no further contrast extravasation. No collateral filling. The microcatheter was brought back into the left colic artery  and advanced into the descending division of the left colic artery to further evaluate the other potential site of bleeding. Arteriography was performed. No evidence of bleeding in the proximal descending colon. The microcatheter was readvanced into the ascending branch and out into the distal visceral branch that appeared to be the site of bleeding on the initial angiogram. Arteriography was performed and carried out to the venous phase. Again, there is no evidence of bleeding at this site. No definite vessel abnormality was identified. No embolization was performed at this site. The microcatheter and rim catheter were removed. A C2 cobra catheter was advanced over a Bentson wire into the abdominal aorta and used to select the celiac artery. A celiac arteriogram was performed. Normal arterial anatomy. No evidence of replaced middle colic artery. No active bleeding. The superior mesenteric artery was then catheterized. Arteriography was performed. No evidence of back bleeding from the middle colic artery where it anastomosis with the left colic artery. No additional sites of bleeding were identified. The catheter was removed. A limited right common femoral arteriogram was performed. Hemostasis was attained with the assistance of an Angio-Seal device. IMPRESSION: 1. Positive angiography with potentially 2 sites of active bleeding in the region of the splenic flexure and proximal descending colon. 2. Successful coil embolization of the more definitive site of bleeding. 3. The second potential site of bleeding could not be identified on subsequent angiography and may have been artifactual. PLAN: 1. Patient is at risk for contrast induced nephropathy given baseline stage 3 chronic kidney disease. Recommend trending BUN and creatinine over the next 48 hours. Keep patient well hydrated. 2. Continue to trend H and H and transfuse as needed. 3. If there is evidence of recurrent or persistent active lower GI bleeding, repeat  angiography should be considered. Signed, Criselda Peaches, MD [facility] Radiology Electronically Signed   By: Jacqulynn Cadet M.D.   On: 03/07/2017 09:37   Ir Angiogram Selective Each Additional Vessel  Result Date: 03/07/2017 INDICATION: 84 year old female with acute lower GI bleed with the site of bleeding localized to the region of the splenic flexure on tagged RBC scan. She presents for visceral angiography and possible embolization. EXAM: IR EMBO ARTERIAL NOT HEMORR HEMANG INC GUIDE ROADMAPPING; SELECTIVE VISCERAL ARTERIOGRAPHY; ADDITIONAL ARTERIOGRAPHY; IR ULTRASOUND GUIDANCE VASC ACCESS RIGHT 1. Ultrasound-guided vascular access, right common femoral artery 2. Celiac artery catheterization and angiogram (first order) 3. Superior mesenteric artery  catheterization and angiogram (first order) 4. Inferior mesenteric artery catheterization and angiogram with additional sub selective arteriography as follows 5. Left colic artery catheterization and angiogram (second order) 6. Ascending branch of the left colic artery catheterization and angiogram (third order) 7. Marginal artery catheterization and angiogram (third order, additional after basic) 8. Coil embolization of marginal artery 9. Catheterization of the descending branch of the left colic artery with angiogram (third order, additional after basic) 10. Catheterization of a distal branch of the ascending branch of the left colic artery with angiogram (third order, additional after basic) MEDICATIONS: None. ANESTHESIA/SEDATION: Moderate (conscious) sedation was employed during this procedure. A total of Versed 1.5 mg and Fentanyl 50 mcg was administered intravenously. Moderate Sedation Time: 44 minutes. The patient's level of consciousness and vital signs were monitored continuously by radiology nursing throughout the procedure under my direct supervision. CONTRAST:  Approximately 75 mL Isovue 300  FLUOROSCOPY TIME:  Fluoroscopy Time: 13 minutes 24 seconds (907 mGy). COMPLICATIONS: None immediate. PROCEDURE: Informed consent was obtained from the patient following explanation of the procedure, risks, benefits and alternatives. The patient understands, agrees and consents for the procedure. All questions were addressed. A time out was performed prior to the initiation of the procedure. Maximal barrier sterile technique utilized including caps, mask, sterile gowns, sterile gloves, large sterile drape, hand hygiene, and Betadine prep. The right common femoral artery was interrogated with ultrasound and found to be widely patent. An image was obtained and stored for the medical record. Local anesthesia was attained by infiltration with 1% lidocaine. A small dermatotomy was made. Under real-time sonographic guidance, the vessel was punctured with a 21 gauge micropuncture needle. Using standard technique, the initial micro needle was exchanged over a 0.018 micro wire for a transitional 4 [place] micro sheath. The micro sheath was then exchanged over a 0.035 wire for a 5 French vascular sheath. A Bentson wire was advanced in the abdominal aorta. A rim catheter was advanced over the wire. The rim catheter was used to select the inferior mesenteric artery. An arteriogram was performed. There is early nonvascular extravasation of contrast material arising from what appears to be the marginal artery at the splenic flexure. This corresponds with the site of bleeding seen on the recent tagged red blood cell study. Interestingly, there is a second site of apparent extravasation arising from a distal visceral branch of the ascending and division of the left colic artery in the proximal descending colon. This extravasation is less robust than the region of extravasation in the marginal artery. A renegade ST microcatheter was then advanced over a Fathom 16 wire in used to select the left colic artery. A left colic arteriogram  was performed. The anatomy of the ascending and descending branches was successfully identified. There is persistent contrast extravasation from the marginal artery. No definite contrast extravasation noted on this second angiogram in the region of the descending colon. The catheter was further advanced into the ascending branch of the left colic artery. Arteriography was performed confirming continued abnormal extravasation of contrast from the marginal artery. This has an ovoid appearance and may represent pooling within a dependent diverticulum versus fusiform aneurysmal dilatation. The microcatheter was successfully advanced into the marginal artery. Contrast demonstrates frank extravasation. Coil embolization was performed using a series of 2 and 3 mm fibered detachable interlock coils. Post embolization arteriography confirms complete occlusion of this vessel with no further contrast extravasation. No collateral filling. The microcatheter was brought back into the left colic artery and advanced  into the descending division of the left colic artery to further evaluate the other potential site of bleeding. Arteriography was performed. No evidence of bleeding in the proximal descending colon. The microcatheter was readvanced into the ascending branch and out into the distal visceral branch that appeared to be the site of bleeding on the initial angiogram. Arteriography was performed and carried out to the venous phase. Again, there is no evidence of bleeding at this site. No definite vessel abnormality was identified. No embolization was performed at this site. The microcatheter and rim catheter were removed. A C2 cobra catheter was advanced over a Bentson wire into the abdominal aorta and used to select the celiac artery. A celiac arteriogram was performed. Normal arterial anatomy. No evidence of replaced middle colic artery. No active bleeding. The superior mesenteric artery was then catheterized. Arteriography  was performed. No evidence of back bleeding from the middle colic artery where it anastomosis with the left colic artery. No additional sites of bleeding were identified. The catheter was removed. A limited right common femoral arteriogram was performed. Hemostasis was attained with the assistance of an Angio-Seal device. IMPRESSION: 1. Positive angiography with potentially 2 sites of active bleeding in the region of the splenic flexure and proximal descending colon. 2. Successful coil embolization of the more definitive site of bleeding. 3. The second potential site of bleeding could not be identified on subsequent angiography and may have been artifactual. PLAN: 1. Patient is at risk for contrast induced nephropathy given baseline stage 3 chronic kidney disease. Recommend trending BUN and creatinine over the next 48 hours. Keep patient well hydrated. 2. Continue to trend H and H and transfuse as needed. 3. If there is evidence of recurrent or persistent active lower GI bleeding, repeat angiography should be considered. Signed, Criselda Peaches, MD [facility] Radiology Electronically Signed   By: Jacqulynn Cadet M.D.   On: 03/07/2017 09:37   Ir US Guide Vasc Access Right  Result Date: 03/07/2017 INDICATION: 84 year old female with acute lower GI bleed with the site of bleeding localized to the region of the splenic flexure on tagged RBC scan. She presents for visceral angiography and possible embolization. EXAM: IR EMBO ARTERIAL NOT HEMORR HEMANG INC GUIDE ROADMAPPING; SELECTIVE VISCERAL ARTERIOGRAPHY; ADDITIONAL ARTERIOGRAPHY; IR ULTRASOUND GUIDANCE VASC ACCESS RIGHT 1. Ultrasound-guided vascular access, right common femoral artery 2. Celiac artery catheterization and angiogram (first order) 3. Superior mesenteric artery catheterization and angiogram (first order) 4. Inferior mesenteric artery catheterization and angiogram with additional sub selective  arteriography as follows 5. Left colic artery catheterization and angiogram (second order) 6. Ascending branch of the left colic artery catheterization and angiogram (third order) 7. Marginal artery catheterization and angiogram (third order, additional after basic) 8. Coil embolization of marginal artery 9. Catheterization of the descending branch of the left colic artery with angiogram (third order, additional after basic) 10. Catheterization of a distal branch of the ascending branch of the left colic artery with angiogram (third order, additional after basic) MEDICATIONS: None. ANESTHESIA/SEDATION: Moderate (conscious) sedation was employed during this procedure. A total of Versed 1.5 mg and Fentanyl 50 mcg was administered intravenously. Moderate Sedation Time: 44 minutes. The patient's level of consciousness and vital signs were monitored continuously by radiology nursing throughout the procedure under my direct supervision. CONTRAST:  Approximately 75 mL Isovue 300 FLUOROSCOPY TIME:  Fluoroscopy Time: 13 minutes 24 seconds (907 mGy). COMPLICATIONS: None immediate. PROCEDURE: Informed consent was obtained from the patient following explanation of the  procedure, risks, benefits and alternatives. The patient understands, agrees and consents for the procedure. All questions were addressed. A time out was performed prior to the initiation of the procedure. Maximal barrier sterile technique utilized including caps, mask, sterile gowns, sterile gloves, large sterile drape, hand hygiene, and Betadine prep. The right common femoral artery was interrogated with ultrasound and found to be widely patent. An image was obtained and stored for the medical record. Local anesthesia was attained by infiltration with 1% lidocaine. A small dermatotomy was made. Under real-time sonographic guidance, the vessel was punctured with a 21 gauge micropuncture needle. Using standard technique, the initial micro needle was exchanged over  a 0.018 micro wire for a transitional 4 [place] micro sheath. The micro sheath was then exchanged over a 0.035 wire for a 5 French vascular sheath. A Bentson wire was advanced in the abdominal aorta. A rim catheter was advanced over the wire. The rim catheter was used to select the inferior mesenteric artery. An arteriogram was performed. There is early nonvascular extravasation of contrast material arising from what appears to be the marginal artery at the splenic flexure. This corresponds with the site of bleeding seen on the recent tagged red blood cell study. Interestingly, there is a second site of apparent extravasation arising from a distal visceral branch of the ascending and division of the left colic artery in the proximal descending colon. This extravasation is less robust than the region of extravasation in the marginal artery. A renegade ST microcatheter was then advanced over a Fathom 16 wire in used to select the left colic artery. A left colic arteriogram was performed. The anatomy of the ascending and descending branches was successfully identified. There is persistent contrast extravasation from the marginal artery. No definite contrast extravasation noted on this second angiogram in the region of the descending colon. The catheter was further advanced into the ascending branch of the left colic artery. Arteriography was performed confirming continued abnormal extravasation of contrast from the marginal artery. This has an ovoid appearance and may represent pooling within a dependent diverticulum versus fusiform aneurysmal dilatation. The microcatheter was successfully advanced into the marginal artery. Contrast demonstrates frank extravasation. Coil embolization was performed using a series of 2 and 3 mm fibered detachable interlock coils. Post embolization arteriography confirms complete occlusion of this vessel with no further contrast extravasation. No collateral filling. The microcatheter was  brought back into the left colic artery and advanced into the descending division of the left colic artery to further evaluate the other potential site of bleeding. Arteriography was performed. No evidence of bleeding in the proximal descending colon. The microcatheter was readvanced into the ascending branch and out into the distal visceral branch that appeared to be the site of bleeding on the initial angiogram. Arteriography was performed and carried out to the venous phase. Again, there is no evidence of bleeding at this site. No definite vessel abnormality was identified. No embolization was performed at this site. The microcatheter and rim catheter were removed. A C2 cobra catheter was advanced over a Bentson wire into the abdominal aorta and used to select the celiac artery. A celiac arteriogram was performed. Normal arterial anatomy. No evidence of replaced middle colic artery. No active bleeding. The superior mesenteric artery was then catheterized. Arteriography was performed. No evidence of back bleeding from the middle colic artery where it anastomosis with the left colic artery. No additional sites of bleeding were identified. The catheter was removed. A limited right common femoral arteriogram  was performed. Hemostasis was attained with the assistance of an Angio-Seal device. IMPRESSION: 1. Positive angiography with potentially 2 sites of active bleeding in the region of the splenic flexure and proximal descending colon. 2. Successful coil embolization of the more definitive site of bleeding. 3. The second potential site of bleeding could not be identified on subsequent angiography and may have been artifactual. PLAN: 1. Patient is at risk for contrast induced nephropathy given baseline stage 3 chronic kidney disease. Recommend trending BUN and creatinine over the next 48 hours. Keep patient well hydrated. 2. Continue to trend H and H and transfuse as needed. 3. If there is evidence of recurrent or  persistent active lower GI bleeding, repeat angiography should be considered. Signed, Criselda Peaches, MD [facility] Radiology Electronically Signed   By: Jacqulynn Cadet M.D.   On: 03/07/2017 09:37   Ir Embo Arterial Not [place] Guide Roadmapping  Result Date: 03/07/2017 INDICATION: 84 year old female with acute lower GI bleed with the site of bleeding localized to the region of the splenic flexure on tagged RBC scan. She presents for visceral angiography and possible embolization. EXAM: IR EMBO ARTERIAL NOT HEMORR HEMANG INC GUIDE ROADMAPPING; SELECTIVE VISCERAL ARTERIOGRAPHY; ADDITIONAL ARTERIOGRAPHY; IR ULTRASOUND GUIDANCE VASC ACCESS RIGHT 1. Ultrasound-guided vascular access, right common femoral artery 2. Celiac artery catheterization and angiogram (first order) 3. Superior mesenteric artery catheterization and angiogram (first order) 4. Inferior mesenteric artery catheterization and angiogram with additional sub selective arteriography as follows 5. Left colic artery catheterization and angiogram (second order) 6. Ascending branch of the left colic artery catheterization and angiogram (third order) 7. Marginal artery catheterization and angiogram (third order, additional after basic) 8. Coil embolization of marginal artery 9. Catheterization of the descending branch of the left colic artery with angiogram (third order, additional after basic) 10. Catheterization of a distal branch of the ascending branch of the left colic artery with angiogram (third order, additional after basic) MEDICATIONS: None. ANESTHESIA/SEDATION: Moderate (conscious) sedation was employed during this procedure. A total of Versed 1.5 mg and Fentanyl 50 mcg was administered intravenously. Moderate Sedation Time: 44 minutes. The patient's level of consciousness and vital signs were monitored continuously by radiology nursing throughout the procedure under my direct  supervision. CONTRAST:  Approximately 75 mL Isovue 300 FLUOROSCOPY TIME:  Fluoroscopy Time: 13 minutes 24 seconds (907 mGy). COMPLICATIONS: None immediate. PROCEDURE: Informed consent was obtained from the patient following explanation of the procedure, risks, benefits and alternatives. The patient understands, agrees and consents for the procedure. All questions were addressed. A time out was performed prior to the initiation of the procedure. Maximal barrier sterile technique utilized including caps, mask, sterile gowns, sterile gloves, large sterile drape, hand hygiene, and Betadine prep. The right common femoral artery was interrogated with ultrasound and found to be widely patent. An image was obtained and stored for the medical record. Local anesthesia was attained by infiltration with 1% lidocaine. A small dermatotomy was made. Under real-time sonographic guidance, the vessel was punctured with a 21 gauge micropuncture needle. Using standard technique, the initial micro needle was exchanged over a 0.018 micro wire for a transitional 4 [place] micro sheath. The micro sheath was then exchanged over a 0.035 wire for a 5 French vascular sheath. A Bentson wire was advanced in the abdominal aorta. A rim catheter was advanced over the wire. The rim catheter was used to select the inferior mesenteric artery. An arteriogram was performed. There is early nonvascular extravasation of  contrast material arising from what appears to be the marginal artery at the splenic flexure. This corresponds with the site of bleeding seen on the recent tagged red blood cell study. Interestingly, there is a second site of apparent extravasation arising from a distal visceral branch of the ascending and division of the left colic artery in the proximal descending colon. This extravasation is less robust than the region of extravasation in the marginal artery. A renegade ST microcatheter was then advanced over a Fathom 16 wire in used to  select the left colic artery. A left colic arteriogram was performed. The anatomy of the ascending and descending branches was successfully identified. There is persistent contrast extravasation from the marginal artery. No definite contrast extravasation noted on this second angiogram in the region of the descending colon. The catheter was further advanced into the ascending branch of the left colic artery. Arteriography was performed confirming continued abnormal extravasation of contrast from the marginal artery. This has an ovoid appearance and may represent pooling within a dependent diverticulum versus fusiform aneurysmal dilatation. The microcatheter was successfully advanced into the marginal artery. Contrast demonstrates frank extravasation. Coil embolization was performed using a series of 2 and 3 mm fibered detachable interlock coils. Post embolization arteriography confirms complete occlusion of this vessel with no further contrast extravasation. No collateral filling. The microcatheter was brought back into the left colic artery and advanced into the descending division of the left colic artery to further evaluate the other potential site of bleeding. Arteriography was performed. No evidence of bleeding in the proximal descending colon. The microcatheter was readvanced into the ascending branch and out into the distal visceral branch that appeared to be the site of bleeding on the initial angiogram. Arteriography was performed and carried out to the venous phase. Again, there is no evidence of bleeding at this site. No definite vessel abnormality was identified. No embolization was performed at this site. The microcatheter and rim catheter were removed. A C2 cobra catheter was advanced over a Bentson wire into the abdominal aorta and used to select the celiac artery. A celiac arteriogram was performed. Normal arterial anatomy. No evidence of replaced middle colic artery. No active bleeding. The superior  mesenteric artery was then catheterized. Arteriography was performed. No evidence of back bleeding from the middle colic artery where it anastomosis with the left colic artery. No additional sites of bleeding were identified. The catheter was removed. A limited right common femoral arteriogram was performed. Hemostasis was attained with the assistance of an Angio-Seal device. IMPRESSION: 1. Positive angiography with potentially 2 sites of active bleeding in the region of the splenic flexure and proximal descending colon. 2. Successful coil embolization of the more definitive site of bleeding. 3. The second potential site of bleeding could not be identified on subsequent angiography and may have been artifactual. PLAN: 1. Patient is at risk for contrast induced nephropathy given baseline stage 3 chronic kidney disease. Recommend trending BUN and creatinine over the next 48 hours. Keep patient well hydrated. 2. Continue to trend H and H and transfuse as needed. 3. If there is evidence of recurrent or persistent active lower GI bleeding, repeat angiography should be considered. Signed, Criselda Peaches, MD [facility] Radiology Electronically Signed   By: Jacqulynn Cadet M.D.   On: 03/07/2017 09:37    Medications: I have reviewed the patient's current medications.  Assessment: 1. Acute lower GI bleed likely secondary to left-sided diverticulosis. Angiogram and embolization of bleeding  vessel by IR yesterday. 2. Anemia, hemoglobin 8.7 on presentation, 8.1 today morning after 2 units PRBC transfusion, no further rectal bleeding since 3 AM today. Hemodynamically stable. 3. Prior incomplete colonoscopy, virtual colonoscopy done yesterday showed diffuse colonic diverticulosis, lipoma in transverse colon, large hiatal hernia.  Plan: Clear liquid diet today, H and H every 12 hours. Transfuse as needed. If further rectal bleeding noted, repeat angiogram should  be considered as per IR.    Ronnette Juniper 03/07/2017, 10:23 AM   Pager [phone] If no answer or after 5 PM call [phone]

## 2017-03-07 NOTE — Progress Notes (Signed)
PROGRESS NOTE  [name]  UJW:[id number] DOB: [birth year] DOA: 03/06/2017 PCP: Fleet Contras, MD   Brief Narrative: [name] is a 84 y.o. female with a history of diverticulosis with recurrent GI bleeding, aortic stenosis, HTN on ASA who presented for lower abdominal cramping and blood in stool. Hgb noted to be 8.7 (previously ~10), 2u PRBCs provided and bleeding scan was performed showing bleed at splenic flexure. Subsequent angiogram with coil embolization of actively bleeding branch of the left colic artery was performed in IR.    Assessment & Plan: Principal Problem:   Acute lower GI bleeding Active Problems:   Diverticula, colon   HTN (hypertension)   High cholesterol   Acute kidney injury (HCC)   Acute blood loss anemia  Diverticulosis with acute lower GI bleeding and acute blood loss anemia on chronic iron-deficiency anemia: Severe diverticulosis previously limited colonoscopies. - Underwent coil embolization of left colic artery 1/23.  - Clear liquid diet for now - Monitor CBC q12h, transfuse if hgb <8. Clinically bleeding is slowing down, though hgb settling at 8.1 this AM.  - GI and IR following  Thrombocytopenia: Mild.  - Holding ASA, will continue to monitor  AKI on stage III CKD: Cr improved with fluids, transfusions to baseline ~1.2.  - Monitor SCr, particularly with contrast given.   HTN:  - Hold metoprolol, losartan/HCTZ  Hypercholesterolemia: Intolerant to multiple medications.   DVT prophylaxis: SCDs Code Status: Full Family Communication: None at bedside Disposition Plan: Continue close monitoring in SDU.   Consultants:   GI  IR  Procedures:  Visceral angiogram with coil embolization of bleeding branch of the left colic artery 03/06/2016.   Antimicrobials:  None  Subjective: Bloody BMs last night and around the time of overnight blood draw ~3am. None since as of this AM. No chest pain, dyspnea. Making urine.   Objective: Vitals:   03/07/17 0314 03/07/17 0400 03/07/17 0900 03/07/17 1100  BP: (!) 105/48 129/74    Pulse:      Resp: 11 13    Temp: 98.2 F (36.8 C)  97.9 F (36.6 C) 98.4 F (36.9 C)  TempSrc: Oral  Oral Oral  SpO2:      Weight: 91 kg (200 lb 9.9 oz)     Height:        Intake/Output Summary (Last 24 hours) at 03/07/2017 1310 Last data filed at 03/07/2017 0800 Gross per 24 hour  Intake 1982.5 ml  Output -  Net 1982.5 ml   Filed Weights   03/06/17 1414 03/07/17 0314  Weight: 90.1 kg (198 lb 10.2 oz) 91 kg (200 lb 9.9 oz)    Gen: Pleasant, elderly female in no distress Pulm: Non-labored breathing room air. Clear to auscultation bilaterally.  CV: Regular rate and rhythm. No murmur, rub, or gallop. No JVD, trace pedal edema. GI: Abdomen soft, not significantly tender, non-distended, with normoactive bowel sounds. No organomegaly or masses felt. Ext: Warm, no deformities Skin: No rashes, lesions no ulcers Neuro: Alert and oriented. No focal neurological deficits. Psych: Judgement and insight appear normal. Mood & affect appropriate.   Data Reviewed: I have personally reviewed following labs and imaging studies  CBC: Recent Labs  Lab 03/06/17 0839 03/07/17 0310 03/07/17 0745  WBC 4.5 8.2 8.2  NEUTROABS 3.0  --   --   HGB 8.7* 8.6* 8.1*  HCT 26.5* 25.7* 24.4*  MCV 92.7 91.1 89.4  PLT 243 147* 143*   Basic Metabolic Panel: Recent Labs  Lab 03/06/17 0839 03/07/17  0745  NA 140 141  K 4.0 4.1  CL 106 111  CO2 24 22  GLUCOSE 117* 101*  BUN 27* 20  CREATININE 1.41* 1.22*  CALCIUM 8.9 8.1*   GFR: Estimated Creatinine Clearance: 32.1 mL/min (A) (by C-G formula based on SCr of 1.22 mg/dL (H)). Liver Function Tests: Recent Labs  Lab 03/06/17 0839 03/07/17 0745  AST 19 17  ALT 9* 7*  ALKPHOS 43 36*  BILITOT 0.4 0.6  PROT 5.6* 4.6*  ALBUMIN 3.0* 2.5*   No results for input(s): LIPASE, AMYLASE in the last 168 hours. No results for input(s): AMMONIA in the last 168  hours. Coagulation Profile: No results for input(s): INR, PROTIME in the last 168 hours. Cardiac Enzymes: No results for input(s): CKTOTAL, CKMB, CKMBINDEX, TROPONINI in the last 168 hours. BNP (last 3 results) No results for input(s): PROBNP in the last 8760 hours. HbA1C: No results for input(s): HGBA1C in the last 72 hours. CBG: No results for input(s): GLUCAP in the last 168 hours. Lipid Profile: No results for input(s): CHOL, HDL, LDLCALC, TRIG, CHOLHDL, LDLDIRECT in the last 72 hours. Thyroid Function Tests: No results for input(s): TSH, T4TOTAL, FREET4, T3FREE, THYROIDAB in the last 72 hours. Anemia Panel: No results for input(s): VITAMINB12, FOLATE, FERRITIN, TIBC, IRON, RETICCTPCT in the last 72 hours. Urine analysis:    Component Value Date/Time   COLORURINE YELLOW 09/08/2014 1319   APPEARANCEUR CLEAR 09/08/2014 1319   LABSPEC 1.018 09/08/2014 1319   PHURINE 7.0 09/08/2014 1319   GLUCOSEU NEGATIVE 09/08/2014 1319   HGBUR TRACE (A) 09/08/2014 1319   BILIRUBINUR NEGATIVE 09/08/2014 1319   KETONESUR NEGATIVE 09/08/2014 1319   PROTEINUR NEGATIVE 09/08/2014 1319   UROBILINOGEN 1.0 09/08/2014 1319   NITRITE NEGATIVE 09/08/2014 1319   LEUKOCYTESUR NEGATIVE 09/08/2014 1319   Recent Results (from the past 240 hour(s))  MRSA PCR Screening     Status: None   Collection Time: 03/06/17  2:14 PM  Result Value Ref Range Status   MRSA by PCR NEGATIVE NEGATIVE Final    Comment:        The GeneXpert MRSA Assay (FDA approved for NASAL specimens only), is one component of a comprehensive MRSA colonization surveillance program. It is not intended to diagnose MRSA infection nor to guide or monitor treatment for MRSA infections.       Radiology Studies: Nm Gi Blood Loss  Result Date: 03/06/2017 CLINICAL DATA:  84 year old female with GI bleeding, anemia. EXAM: NUCLEAR MEDICINE GASTROINTESTINAL BLEEDING SCAN TECHNIQUE: Sequential abdominal images were obtained following  intravenous administration of Tc-48m labeled red blood cells. RADIOPHARMACEUTICALS:  25.4 mCi Tc-30m pertechnetate in-vitro labeled red cells. COMPARISON:  CT Abdomen and Pelvis 03/08/2015 FINDINGS: Physiologic blood pool activity is present, but almost immediately there is abnormal radiotracer activity within bowel loops of the left abdomen. This appears to originate at the splenic flexure, and then transit is in both the distal transverse colon and the descending colon to the sigmoid region. Widespread diverticulosis in the large bowel on the comparison CT Abdomen and Pelvis. IMPRESSION: Positive for active GI bleeding. Bleeding origin appears to be the splenic flexure of colon. Critical Value/emergent results were called by telephone at the time of interpretation on 03/06/2017 at 1347 hr to Dr. Luberta Robertson in the ED, who verbally acknowledged these results. Electronically Signed   By: Odessa Fleming M.D.   On: 03/06/2017 14:04   Ir Angiogram Visceral Selective  Result Date: 03/07/2017 INDICATION: 84 year old female with acute lower GI bleed with the site  of bleeding localized to the region of the splenic flexure on tagged RBC scan. She presents for visceral angiography and possible embolization. EXAM: IR EMBO ARTERIAL NOT HEMORR HEMANG INC GUIDE ROADMAPPING; SELECTIVE VISCERAL ARTERIOGRAPHY; ADDITIONAL ARTERIOGRAPHY; IR ULTRASOUND GUIDANCE VASC ACCESS RIGHT 1. Ultrasound-guided vascular access, right common femoral artery 2. Celiac artery catheterization and angiogram (first order) 3. Superior mesenteric artery catheterization and angiogram (first order) 4. Inferior mesenteric artery catheterization and angiogram with additional sub selective arteriography as follows 5. Left colic artery catheterization and angiogram (second order) 6. Ascending branch of the left colic artery catheterization and angiogram (third order) 7. Marginal artery catheterization and angiogram (third order, additional after basic) 8. Coil  embolization of marginal artery 9. Catheterization of the descending branch of the left colic artery with angiogram (third order, additional after basic) 10. Catheterization of a distal branch of the ascending branch of the left colic artery with angiogram (third order, additional after basic) MEDICATIONS: None. ANESTHESIA/SEDATION: Moderate (conscious) sedation was employed during this procedure. A total of Versed 1.5 mg and Fentanyl 50 mcg was administered intravenously. Moderate Sedation Time: 44 minutes. The patient's level of consciousness and vital signs were monitored continuously by radiology nursing throughout the procedure under my direct supervision. CONTRAST:  Approximately 75 mL Isovue 300 FLUOROSCOPY TIME:  Fluoroscopy Time: 13 minutes 24 seconds (907 mGy). COMPLICATIONS: None immediate. PROCEDURE: Informed consent was obtained from the patient following explanation of the procedure, risks, benefits and alternatives. The patient understands, agrees and consents for the procedure. All questions were addressed. A time out was performed prior to the initiation of the procedure. Maximal barrier sterile technique utilized including caps, mask, sterile gowns, sterile gloves, large sterile drape, hand hygiene, and Betadine prep. The right common femoral artery was interrogated with ultrasound and found to be widely patent. An image was obtained and stored for the medical record. Local anesthesia was attained by infiltration with 1% lidocaine. A small dermatotomy was made. Under real-time sonographic guidance, the vessel was punctured with a 21 gauge micropuncture needle. Using standard technique, the initial micro needle was exchanged over a 0.018 micro wire for a transitional 4 [place] micro sheath. The micro sheath was then exchanged over a 0.035 wire for a 5 French vascular sheath. A Bentson wire was advanced in the abdominal aorta. A rim catheter was advanced over the wire. The rim catheter was used to  select the inferior mesenteric artery. An arteriogram was performed. There is early nonvascular extravasation of contrast material arising from what appears to be the marginal artery at the splenic flexure. This corresponds with the site of bleeding seen on the recent tagged red blood cell study. Interestingly, there is a second site of apparent extravasation arising from a distal visceral branch of the ascending and division of the left colic artery in the proximal descending colon. This extravasation is less robust than the region of extravasation in the marginal artery. A renegade ST microcatheter was then advanced over a Fathom 16 wire in used to select the left colic artery. A left colic arteriogram was performed. The anatomy of the ascending and descending branches was successfully identified. There is persistent contrast extravasation from the marginal artery. No definite contrast extravasation noted on this second angiogram in the region of the descending colon. The catheter was further advanced into the ascending branch of the left colic artery. Arteriography was performed confirming continued abnormal extravasation of contrast from the marginal artery. This has an ovoid appearance and may represent pooling within a dependent  diverticulum versus fusiform aneurysmal dilatation. The microcatheter was successfully advanced into the marginal artery. Contrast demonstrates frank extravasation. Coil embolization was performed using a series of 2 and 3 mm fibered detachable interlock coils. Post embolization arteriography confirms complete occlusion of this vessel with no further contrast extravasation. No collateral filling. The microcatheter was brought back into the left colic artery and advanced into the descending division of the left colic artery to further evaluate the other potential site of bleeding. Arteriography was performed. No evidence of bleeding in the proximal descending colon. The microcatheter  was readvanced into the ascending branch and out into the distal visceral branch that appeared to be the site of bleeding on the initial angiogram. Arteriography was performed and carried out to the venous phase. Again, there is no evidence of bleeding at this site. No definite vessel abnormality was identified. No embolization was performed at this site. The microcatheter and rim catheter were removed. A C2 cobra catheter was advanced over a Bentson wire into the abdominal aorta and used to select the celiac artery. A celiac arteriogram was performed. Normal arterial anatomy. No evidence of replaced middle colic artery. No active bleeding. The superior mesenteric artery was then catheterized. Arteriography was performed. No evidence of back bleeding from the middle colic artery where it anastomosis with the left colic artery. No additional sites of bleeding were identified. The catheter was removed. A limited right common femoral arteriogram was performed. Hemostasis was attained with the assistance of an Angio-Seal device. IMPRESSION: 1. Positive angiography with potentially 2 sites of active bleeding in the region of the splenic flexure and proximal descending colon. 2. Successful coil embolization of the more definitive site of bleeding. 3. The second potential site of bleeding could not be identified on subsequent angiography and may have been artifactual. PLAN: 1. Patient is at risk for contrast induced nephropathy given baseline stage 3 chronic kidney disease. Recommend trending BUN and creatinine over the next 48 hours. Keep patient well hydrated. 2. Continue to trend H and H and transfuse as needed. 3. If there is evidence of recurrent or persistent active lower GI bleeding, repeat angiography should be considered. Signed, Sterling Big, MD [facility] Radiology Electronically Signed   By: Malachy Moan M.D.   On: 03/07/2017 09:37   Ir Angiogram  Visceral Selective  Result Date: 03/07/2017 INDICATION: 84 year old female with acute lower GI bleed with the site of bleeding localized to the region of the splenic flexure on tagged RBC scan. She presents for visceral angiography and possible embolization. EXAM: IR EMBO ARTERIAL NOT HEMORR HEMANG INC GUIDE ROADMAPPING; SELECTIVE VISCERAL ARTERIOGRAPHY; ADDITIONAL ARTERIOGRAPHY; IR ULTRASOUND GUIDANCE VASC ACCESS RIGHT 1. Ultrasound-guided vascular access, right common femoral artery 2. Celiac artery catheterization and angiogram (first order) 3. Superior mesenteric artery catheterization and angiogram (first order) 4. Inferior mesenteric artery catheterization and angiogram with additional sub selective arteriography as follows 5. Left colic artery catheterization and angiogram (second order) 6. Ascending branch of the left colic artery catheterization and angiogram (third order) 7. Marginal artery catheterization and angiogram (third order, additional after basic) 8. Coil embolization of marginal artery 9. Catheterization of the descending branch of the left colic artery with angiogram (third order, additional after basic) 10. Catheterization of a distal branch of the ascending branch of the left colic artery with angiogram (third order, additional after basic) MEDICATIONS: None. ANESTHESIA/SEDATION: Moderate (conscious) sedation was employed during this procedure. A total of Versed 1.5 mg and Fentanyl 50 mcg was  administered intravenously. Moderate Sedation Time: 44 minutes. The patient's level of consciousness and vital signs were monitored continuously by radiology nursing throughout the procedure under my direct supervision. CONTRAST:  Approximately 75 mL Isovue 300 FLUOROSCOPY TIME:  Fluoroscopy Time: 13 minutes 24 seconds (907 mGy). COMPLICATIONS: None immediate. PROCEDURE: Informed consent was obtained from the patient following explanation of the procedure, risks, benefits and alternatives. The patient  understands, agrees and consents for the procedure. All questions were addressed. A time out was performed prior to the initiation of the procedure. Maximal barrier sterile technique utilized including caps, mask, sterile gowns, sterile gloves, large sterile drape, hand hygiene, and Betadine prep. The right common femoral artery was interrogated with ultrasound and found to be widely patent. An image was obtained and stored for the medical record. Local anesthesia was attained by infiltration with 1% lidocaine. A small dermatotomy was made. Under real-time sonographic guidance, the vessel was punctured with a 21 gauge micropuncture needle. Using standard technique, the initial micro needle was exchanged over a 0.018 micro wire for a transitional 4 [place] micro sheath. The micro sheath was then exchanged over a 0.035 wire for a 5 French vascular sheath. A Bentson wire was advanced in the abdominal aorta. A rim catheter was advanced over the wire. The rim catheter was used to select the inferior mesenteric artery. An arteriogram was performed. There is early nonvascular extravasation of contrast material arising from what appears to be the marginal artery at the splenic flexure. This corresponds with the site of bleeding seen on the recent tagged red blood cell study. Interestingly, there is a second site of apparent extravasation arising from a distal visceral branch of the ascending and division of the left colic artery in the proximal descending colon. This extravasation is less robust than the region of extravasation in the marginal artery. A renegade ST microcatheter was then advanced over a Fathom 16 wire in used to select the left colic artery. A left colic arteriogram was performed. The anatomy of the ascending and descending branches was successfully identified. There is persistent contrast extravasation from the marginal artery. No definite contrast extravasation noted on this second angiogram in the region  of the descending colon. The catheter was further advanced into the ascending branch of the left colic artery. Arteriography was performed confirming continued abnormal extravasation of contrast from the marginal artery. This has an ovoid appearance and may represent pooling within a dependent diverticulum versus fusiform aneurysmal dilatation. The microcatheter was successfully advanced into the marginal artery. Contrast demonstrates frank extravasation. Coil embolization was performed using a series of 2 and 3 mm fibered detachable interlock coils. Post embolization arteriography confirms complete occlusion of this vessel with no further contrast extravasation. No collateral filling. The microcatheter was brought back into the left colic artery and advanced into the descending division of the left colic artery to further evaluate the other potential site of bleeding. Arteriography was performed. No evidence of bleeding in the proximal descending colon. The microcatheter was readvanced into the ascending branch and out into the distal visceral branch that appeared to be the site of bleeding on the initial angiogram. Arteriography was performed and carried out to the venous phase. Again, there is no evidence of bleeding at this site. No definite vessel abnormality was identified. No embolization was performed at this site. The microcatheter and rim catheter were removed. A C2 cobra catheter was advanced over a Bentson wire into the abdominal aorta and used to select the celiac artery. A celiac  arteriogram was performed. Normal arterial anatomy. No evidence of replaced middle colic artery. No active bleeding. The superior mesenteric artery was then catheterized. Arteriography was performed. No evidence of back bleeding from the middle colic artery where it anastomosis with the left colic artery. No additional sites of bleeding were identified. The catheter was removed. A limited right common femoral arteriogram was  performed. Hemostasis was attained with the assistance of an Angio-Seal device. IMPRESSION: 1. Positive angiography with potentially 2 sites of active bleeding in the region of the splenic flexure and proximal descending colon. 2. Successful coil embolization of the more definitive site of bleeding. 3. The second potential site of bleeding could not be identified on subsequent angiography and may have been artifactual. PLAN: 1. Patient is at risk for contrast induced nephropathy given baseline stage 3 chronic kidney disease. Recommend trending BUN and creatinine over the next 48 hours. Keep patient well hydrated. 2. Continue to trend H and H and transfuse as needed. 3. If there is evidence of recurrent or persistent active lower GI bleeding, repeat angiography should be considered. Signed, Sterling Big, MD [facility] Radiology Electronically Signed   By: Malachy Moan M.D.   On: 03/07/2017 09:37   Ir Angiogram Visceral Selective  Result Date: 03/07/2017 INDICATION: 84 year old female with acute lower GI bleed with the site of bleeding localized to the region of the splenic flexure on tagged RBC scan. She presents for visceral angiography and possible embolization. EXAM: IR EMBO ARTERIAL NOT HEMORR HEMANG INC GUIDE ROADMAPPING; SELECTIVE VISCERAL ARTERIOGRAPHY; ADDITIONAL ARTERIOGRAPHY; IR ULTRASOUND GUIDANCE VASC ACCESS RIGHT 1. Ultrasound-guided vascular access, right common femoral artery 2. Celiac artery catheterization and angiogram (first order) 3. Superior mesenteric artery catheterization and angiogram (first order) 4. Inferior mesenteric artery catheterization and angiogram with additional sub selective arteriography as follows 5. Left colic artery catheterization and angiogram (second order) 6. Ascending branch of the left colic artery catheterization and angiogram (third order) 7. Marginal artery catheterization and angiogram (third order,  additional after basic) 8. Coil embolization of marginal artery 9. Catheterization of the descending branch of the left colic artery with angiogram (third order, additional after basic) 10. Catheterization of a distal branch of the ascending branch of the left colic artery with angiogram (third order, additional after basic) MEDICATIONS: None. ANESTHESIA/SEDATION: Moderate (conscious) sedation was employed during this procedure. A total of Versed 1.5 mg and Fentanyl 50 mcg was administered intravenously. Moderate Sedation Time: 44 minutes. The patient's level of consciousness and vital signs were monitored continuously by radiology nursing throughout the procedure under my direct supervision. CONTRAST:  Approximately 75 mL Isovue 300 FLUOROSCOPY TIME:  Fluoroscopy Time: 13 minutes 24 seconds (907 mGy). COMPLICATIONS: None immediate. PROCEDURE: Informed consent was obtained from the patient following explanation of the procedure, risks, benefits and alternatives. The patient understands, agrees and consents for the procedure. All questions were addressed. A time out was performed prior to the initiation of the procedure. Maximal barrier sterile technique utilized including caps, mask, sterile gowns, sterile gloves, large sterile drape, hand hygiene, and Betadine prep. The right common femoral artery was interrogated with ultrasound and found to be widely patent. An image was obtained and stored for the medical record. Local anesthesia was attained by infiltration with 1% lidocaine. A small dermatotomy was made. Under real-time sonographic guidance, the vessel was punctured with a 21 gauge micropuncture needle. Using standard technique, the initial micro needle was exchanged over a 0.018 micro wire for a transitional 4 [place]  micro sheath. The micro sheath was then exchanged over a 0.035 wire for a 5 French vascular sheath. A Bentson wire was advanced in the abdominal aorta. A rim catheter was advanced over the wire.  The rim catheter was used to select the inferior mesenteric artery. An arteriogram was performed. There is early nonvascular extravasation of contrast material arising from what appears to be the marginal artery at the splenic flexure. This corresponds with the site of bleeding seen on the recent tagged red blood cell study. Interestingly, there is a second site of apparent extravasation arising from a distal visceral branch of the ascending and division of the left colic artery in the proximal descending colon. This extravasation is less robust than the region of extravasation in the marginal artery. A renegade ST microcatheter was then advanced over a Fathom 16 wire in used to select the left colic artery. A left colic arteriogram was performed. The anatomy of the ascending and descending branches was successfully identified. There is persistent contrast extravasation from the marginal artery. No definite contrast extravasation noted on this second angiogram in the region of the descending colon. The catheter was further advanced into the ascending branch of the left colic artery. Arteriography was performed confirming continued abnormal extravasation of contrast from the marginal artery. This has an ovoid appearance and may represent pooling within a dependent diverticulum versus fusiform aneurysmal dilatation. The microcatheter was successfully advanced into the marginal artery. Contrast demonstrates frank extravasation. Coil embolization was performed using a series of 2 and 3 mm fibered detachable interlock coils. Post embolization arteriography confirms complete occlusion of this vessel with no further contrast extravasation. No collateral filling. The microcatheter was brought back into the left colic artery and advanced into the descending division of the left colic artery to further evaluate the other potential site of bleeding. Arteriography was performed. No evidence of bleeding in the proximal  descending colon. The microcatheter was readvanced into the ascending branch and out into the distal visceral branch that appeared to be the site of bleeding on the initial angiogram. Arteriography was performed and carried out to the venous phase. Again, there is no evidence of bleeding at this site. No definite vessel abnormality was identified. No embolization was performed at this site. The microcatheter and rim catheter were removed. A C2 cobra catheter was advanced over a Bentson wire into the abdominal aorta and used to select the celiac artery. A celiac arteriogram was performed. Normal arterial anatomy. No evidence of replaced middle colic artery. No active bleeding. The superior mesenteric artery was then catheterized. Arteriography was performed. No evidence of back bleeding from the middle colic artery where it anastomosis with the left colic artery. No additional sites of bleeding were identified. The catheter was removed. A limited right common femoral arteriogram was performed. Hemostasis was attained with the assistance of an Angio-Seal device. IMPRESSION: 1. Positive angiography with potentially 2 sites of active bleeding in the region of the splenic flexure and proximal descending colon. 2. Successful coil embolization of the more definitive site of bleeding. 3. The second potential site of bleeding could not be identified on subsequent angiography and may have been artifactual. PLAN: 1. Patient is at risk for contrast induced nephropathy given baseline stage 3 chronic kidney disease. Recommend trending BUN and creatinine over the next 48 hours. Keep patient well hydrated. 2. Continue to trend H and H and transfuse as needed. 3. If there is evidence of recurrent or persistent active lower GI bleeding, repeat angiography should  be considered. Signed, Sterling Big, MD [facility] Radiology Electronically Signed   By: Malachy Moan M.D.   On:  03/07/2017 09:37   Ir Angiogram Selective Each Additional Vessel  Result Date: 03/07/2017 INDICATION: 84 year old female with acute lower GI bleed with the site of bleeding localized to the region of the splenic flexure on tagged RBC scan. She presents for visceral angiography and possible embolization. EXAM: IR EMBO ARTERIAL NOT HEMORR HEMANG INC GUIDE ROADMAPPING; SELECTIVE VISCERAL ARTERIOGRAPHY; ADDITIONAL ARTERIOGRAPHY; IR ULTRASOUND GUIDANCE VASC ACCESS RIGHT 1. Ultrasound-guided vascular access, right common femoral artery 2. Celiac artery catheterization and angiogram (first order) 3. Superior mesenteric artery catheterization and angiogram (first order) 4. Inferior mesenteric artery catheterization and angiogram with additional sub selective arteriography as follows 5. Left colic artery catheterization and angiogram (second order) 6. Ascending branch of the left colic artery catheterization and angiogram (third order) 7. Marginal artery catheterization and angiogram (third order, additional after basic) 8. Coil embolization of marginal artery 9. Catheterization of the descending branch of the left colic artery with angiogram (third order, additional after basic) 10. Catheterization of a distal branch of the ascending branch of the left colic artery with angiogram (third order, additional after basic) MEDICATIONS: None. ANESTHESIA/SEDATION: Moderate (conscious) sedation was employed during this procedure. A total of Versed 1.5 mg and Fentanyl 50 mcg was administered intravenously. Moderate Sedation Time: 44 minutes. The patient's level of consciousness and vital signs were monitored continuously by radiology nursing throughout the procedure under my direct supervision. CONTRAST:  Approximately 75 mL Isovue 300 FLUOROSCOPY TIME:  Fluoroscopy Time: 13 minutes 24 seconds (907 mGy). COMPLICATIONS: None immediate. PROCEDURE: Informed consent was obtained from the patient following explanation of the  procedure, risks, benefits and alternatives. The patient understands, agrees and consents for the procedure. All questions were addressed. A time out was performed prior to the initiation of the procedure. Maximal barrier sterile technique utilized including caps, mask, sterile gowns, sterile gloves, large sterile drape, hand hygiene, and Betadine prep. The right common femoral artery was interrogated with ultrasound and found to be widely patent. An image was obtained and stored for the medical record. Local anesthesia was attained by infiltration with 1% lidocaine. A small dermatotomy was made. Under real-time sonographic guidance, the vessel was punctured with a 21 gauge micropuncture needle. Using standard technique, the initial micro needle was exchanged over a 0.018 micro wire for a transitional 4 [place] micro sheath. The micro sheath was then exchanged over a 0.035 wire for a 5 French vascular sheath. A Bentson wire was advanced in the abdominal aorta. A rim catheter was advanced over the wire. The rim catheter was used to select the inferior mesenteric artery. An arteriogram was performed. There is early nonvascular extravasation of contrast material arising from what appears to be the marginal artery at the splenic flexure. This corresponds with the site of bleeding seen on the recent tagged red blood cell study. Interestingly, there is a second site of apparent extravasation arising from a distal visceral branch of the ascending and division of the left colic artery in the proximal descending colon. This extravasation is less robust than the region of extravasation in the marginal artery. A renegade ST microcatheter was then advanced over a Fathom 16 wire in used to select the left colic artery. A left colic arteriogram was performed. The anatomy of the ascending and descending branches was successfully identified. There is persistent contrast extravasation from the marginal artery. No definite contrast  extravasation noted on this second angiogram in the region of the descending colon. The catheter was further advanced into the ascending branch of the left colic artery. Arteriography was performed confirming continued abnormal extravasation of contrast from the marginal artery. This has an ovoid appearance and may represent pooling within a dependent diverticulum versus fusiform aneurysmal dilatation. The microcatheter was successfully advanced into the marginal artery. Contrast demonstrates frank extravasation. Coil embolization was performed using a series of 2 and 3 mm fibered detachable interlock coils. Post embolization arteriography confirms complete occlusion of this vessel with no further contrast extravasation. No collateral filling. The microcatheter was brought back into the left colic artery and advanced into the descending division of the left colic artery to further evaluate the other potential site of bleeding. Arteriography was performed. No evidence of bleeding in the proximal descending colon. The microcatheter was readvanced into the ascending branch and out into the distal visceral branch that appeared to be the site of bleeding on the initial angiogram. Arteriography was performed and carried out to the venous phase. Again, there is no evidence of bleeding at this site. No definite vessel abnormality was identified. No embolization was performed at this site. The microcatheter and rim catheter were removed. A C2 cobra catheter was advanced over a Bentson wire into the abdominal aorta and used to select the celiac artery. A celiac arteriogram was performed. Normal arterial anatomy. No evidence of replaced middle colic artery. No active bleeding. The superior mesenteric artery was then catheterized. Arteriography was performed. No evidence of back bleeding from the middle colic artery where it anastomosis with the left colic artery. No additional sites of bleeding were identified. The catheter  was removed. A limited right common femoral arteriogram was performed. Hemostasis was attained with the assistance of an Angio-Seal device. IMPRESSION: 1. Positive angiography with potentially 2 sites of active bleeding in the region of the splenic flexure and proximal descending colon. 2. Successful coil embolization of the more definitive site of bleeding. 3. The second potential site of bleeding could not be identified on subsequent angiography and may have been artifactual. PLAN: 1. Patient is at risk for contrast induced nephropathy given baseline stage 3 chronic kidney disease. Recommend trending BUN and creatinine over the next 48 hours. Keep patient well hydrated. 2. Continue to trend H and H and transfuse as needed. 3. If there is evidence of recurrent or persistent active lower GI bleeding, repeat angiography should be considered. Signed, Sterling Big, MD [facility] Radiology Electronically Signed   By: Malachy Moan M.D.   On: 03/07/2017 09:37   Ir Angiogram Selective Each Additional Vessel  Result Date: 03/07/2017 INDICATION: 84 year old female with acute lower GI bleed with the site of bleeding localized to the region of the splenic flexure on tagged RBC scan. She presents for visceral angiography and possible embolization. EXAM: IR EMBO ARTERIAL NOT HEMORR HEMANG INC GUIDE ROADMAPPING; SELECTIVE VISCERAL ARTERIOGRAPHY; ADDITIONAL ARTERIOGRAPHY; IR ULTRASOUND GUIDANCE VASC ACCESS RIGHT 1. Ultrasound-guided vascular access, right common femoral artery 2. Celiac artery catheterization and angiogram (first order) 3. Superior mesenteric artery catheterization and angiogram (first order) 4. Inferior mesenteric artery catheterization and angiogram with additional sub selective arteriography as follows 5. Left colic artery catheterization and angiogram (second order) 6. Ascending branch of the left colic artery catheterization and angiogram  (third order) 7. Marginal artery catheterization and angiogram (third order, additional after basic) 8. Coil embolization of marginal artery 9. Catheterization of the descending branch of the  left colic artery with angiogram (third order, additional after basic) 10. Catheterization of a distal branch of the ascending branch of the left colic artery with angiogram (third order, additional after basic) MEDICATIONS: None. ANESTHESIA/SEDATION: Moderate (conscious) sedation was employed during this procedure. A total of Versed 1.5 mg and Fentanyl 50 mcg was administered intravenously. Moderate Sedation Time: 44 minutes. The patient's level of consciousness and vital signs were monitored continuously by radiology nursing throughout the procedure under my direct supervision. CONTRAST:  Approximately 75 mL Isovue 300 FLUOROSCOPY TIME:  Fluoroscopy Time: 13 minutes 24 seconds (907 mGy). COMPLICATIONS: None immediate. PROCEDURE: Informed consent was obtained from the patient following explanation of the procedure, risks, benefits and alternatives. The patient understands, agrees and consents for the procedure. All questions were addressed. A time out was performed prior to the initiation of the procedure. Maximal barrier sterile technique utilized including caps, mask, sterile gowns, sterile gloves, large sterile drape, hand hygiene, and Betadine prep. The right common femoral artery was interrogated with ultrasound and found to be widely patent. An image was obtained and stored for the medical record. Local anesthesia was attained by infiltration with 1% lidocaine. A small dermatotomy was made. Under real-time sonographic guidance, the vessel was punctured with a 21 gauge micropuncture needle. Using standard technique, the initial micro needle was exchanged over a 0.018 micro wire for a transitional 4 [place] micro sheath. The micro sheath was then exchanged over a 0.035 wire for a 5 French vascular sheath. A Bentson wire was  advanced in the abdominal aorta. A rim catheter was advanced over the wire. The rim catheter was used to select the inferior mesenteric artery. An arteriogram was performed. There is early nonvascular extravasation of contrast material arising from what appears to be the marginal artery at the splenic flexure. This corresponds with the site of bleeding seen on the recent tagged red blood cell study. Interestingly, there is a second site of apparent extravasation arising from a distal visceral branch of the ascending and division of the left colic artery in the proximal descending colon. This extravasation is less robust than the region of extravasation in the marginal artery. A renegade ST microcatheter was then advanced over a Fathom 16 wire in used to select the left colic artery. A left colic arteriogram was performed. The anatomy of the ascending and descending branches was successfully identified. There is persistent contrast extravasation from the marginal artery. No definite contrast extravasation noted on this second angiogram in the region of the descending colon. The catheter was further advanced into the ascending branch of the left colic artery. Arteriography was performed confirming continued abnormal extravasation of contrast from the marginal artery. This has an ovoid appearance and may represent pooling within a dependent diverticulum versus fusiform aneurysmal dilatation. The microcatheter was successfully advanced into the marginal artery. Contrast demonstrates frank extravasation. Coil embolization was performed using a series of 2 and 3 mm fibered detachable interlock coils. Post embolization arteriography confirms complete occlusion of this vessel with no further contrast extravasation. No collateral filling. The microcatheter was brought back into the left colic artery and advanced into the descending division of the left colic artery to further evaluate the other potential site of bleeding.  Arteriography was performed. No evidence of bleeding in the proximal descending colon. The microcatheter was readvanced into the ascending branch and out into the distal visceral branch that appeared to be the site of bleeding on the initial angiogram. Arteriography was performed and carried out to the venous  phase. Again, there is no evidence of bleeding at this site. No definite vessel abnormality was identified. No embolization was performed at this site. The microcatheter and rim catheter were removed. A C2 cobra catheter was advanced over a Bentson wire into the abdominal aorta and used to select the celiac artery. A celiac arteriogram was performed. Normal arterial anatomy. No evidence of replaced middle colic artery. No active bleeding. The superior mesenteric artery was then catheterized. Arteriography was performed. No evidence of back bleeding from the middle colic artery where it anastomosis with the left colic artery. No additional sites of bleeding were identified. The catheter was removed. A limited right common femoral arteriogram was performed. Hemostasis was attained with the assistance of an Angio-Seal device. IMPRESSION: 1. Positive angiography with potentially 2 sites of active bleeding in the region of the splenic flexure and proximal descending colon. 2. Successful coil embolization of the more definitive site of bleeding. 3. The second potential site of bleeding could not be identified on subsequent angiography and may have been artifactual. PLAN: 1. Patient is at risk for contrast induced nephropathy given baseline stage 3 chronic kidney disease. Recommend trending BUN and creatinine over the next 48 hours. Keep patient well hydrated. 2. Continue to trend H and H and transfuse as needed. 3. If there is evidence of recurrent or persistent active lower GI bleeding, repeat angiography should be considered. Signed, Sterling Big, MD [facility]  [facility] Radiology Electronically Signed   By: Malachy Moan M.D.   On: 03/07/2017 09:37   Ir Angiogram Selective Each Additional Vessel  Result Date: 03/07/2017 INDICATION: 84 year old female with acute lower GI bleed with the site of bleeding localized to the region of the splenic flexure on tagged RBC scan. She presents for visceral angiography and possible embolization. EXAM: IR EMBO ARTERIAL NOT HEMORR HEMANG INC GUIDE ROADMAPPING; SELECTIVE VISCERAL ARTERIOGRAPHY; ADDITIONAL ARTERIOGRAPHY; IR ULTRASOUND GUIDANCE VASC ACCESS RIGHT 1. Ultrasound-guided vascular access, right common femoral artery 2. Celiac artery catheterization and angiogram (first order) 3. Superior mesenteric artery catheterization and angiogram (first order) 4. Inferior mesenteric artery catheterization and angiogram with additional sub selective arteriography as follows 5. Left colic artery catheterization and angiogram (second order) 6. Ascending branch of the left colic artery catheterization and angiogram (third order) 7. Marginal artery catheterization and angiogram (third order, additional after basic) 8. Coil embolization of marginal artery 9. Catheterization of the descending branch of the left colic artery with angiogram (third order, additional after basic) 10. Catheterization of a distal branch of the ascending branch of the left colic artery with angiogram (third order, additional after basic) MEDICATIONS: None. ANESTHESIA/SEDATION: Moderate (conscious) sedation was employed during this procedure. A total of Versed 1.5 mg and Fentanyl 50 mcg was administered intravenously. Moderate Sedation Time: 44 minutes. The patient's level of consciousness and vital signs were monitored continuously by radiology nursing throughout the procedure under my direct supervision. CONTRAST:  Approximately 75 mL Isovue 300 FLUOROSCOPY TIME:  Fluoroscopy Time: 13 minutes 24 seconds (907 mGy). COMPLICATIONS: None immediate. PROCEDURE:  Informed consent was obtained from the patient following explanation of the procedure, risks, benefits and alternatives. The patient understands, agrees and consents for the procedure. All questions were addressed. A time out was performed prior to the initiation of the procedure. Maximal barrier sterile technique utilized including caps, mask, sterile gowns, sterile gloves, large sterile drape, hand hygiene, and Betadine prep. The right common femoral artery was interrogated with ultrasound and found to be widely patent. An  image was obtained and stored for the medical record. Local anesthesia was attained by infiltration with 1% lidocaine. A small dermatotomy was made. Under real-time sonographic guidance, the vessel was punctured with a 21 gauge micropuncture needle. Using standard technique, the initial micro needle was exchanged over a 0.018 micro wire for a transitional 4 [place] micro sheath. The micro sheath was then exchanged over a 0.035 wire for a 5 French vascular sheath. A Bentson wire was advanced in the abdominal aorta. A rim catheter was advanced over the wire. The rim catheter was used to select the inferior mesenteric artery. An arteriogram was performed. There is early nonvascular extravasation of contrast material arising from what appears to be the marginal artery at the splenic flexure. This corresponds with the site of bleeding seen on the recent tagged red blood cell study. Interestingly, there is a second site of apparent extravasation arising from a distal visceral branch of the ascending and division of the left colic artery in the proximal descending colon. This extravasation is less robust than the region of extravasation in the marginal artery. A renegade ST microcatheter was then advanced over a Fathom 16 wire in used to select the left colic artery. A left colic arteriogram was performed. The anatomy of the ascending and descending branches was successfully identified. There is  persistent contrast extravasation from the marginal artery. No definite contrast extravasation noted on this second angiogram in the region of the descending colon. The catheter was further advanced into the ascending branch of the left colic artery. Arteriography was performed confirming continued abnormal extravasation of contrast from the marginal artery. This has an ovoid appearance and may represent pooling within a dependent diverticulum versus fusiform aneurysmal dilatation. The microcatheter was successfully advanced into the marginal artery. Contrast demonstrates frank extravasation. Coil embolization was performed using a series of 2 and 3 mm fibered detachable interlock coils. Post embolization arteriography confirms complete occlusion of this vessel with no further contrast extravasation. No collateral filling. The microcatheter was brought back into the left colic artery and advanced into the descending division of the left colic artery to further evaluate the other potential site of bleeding. Arteriography was performed. No evidence of bleeding in the proximal descending colon. The microcatheter was readvanced into the ascending branch and out into the distal visceral branch that appeared to be the site of bleeding on the initial angiogram. Arteriography was performed and carried out to the venous phase. Again, there is no evidence of bleeding at this site. No definite vessel abnormality was identified. No embolization was performed at this site. The microcatheter and rim catheter were removed. A C2 cobra catheter was advanced over a Bentson wire into the abdominal aorta and used to select the celiac artery. A celiac arteriogram was performed. Normal arterial anatomy. No evidence of replaced middle colic artery. No active bleeding. The superior mesenteric artery was then catheterized. Arteriography was performed. No evidence of back bleeding from the middle colic artery where it anastomosis with the  left colic artery. No additional sites of bleeding were identified. The catheter was removed. A limited right common femoral arteriogram was performed. Hemostasis was attained with the assistance of an Angio-Seal device. IMPRESSION: 1. Positive angiography with potentially 2 sites of active bleeding in the region of the splenic flexure and proximal descending colon. 2. Successful coil embolization of the more definitive site of bleeding. 3. The second potential site of bleeding could not be identified on subsequent angiography and may have been artifactual. PLAN: 1.  Patient is at risk for contrast induced nephropathy given baseline stage 3 chronic kidney disease. Recommend trending BUN and creatinine over the next 48 hours. Keep patient well hydrated. 2. Continue to trend H and H and transfuse as needed. 3. If there is evidence of recurrent or persistent active lower GI bleeding, repeat angiography should be considered. Signed, Sterling Big, MD [facility] Radiology Electronically Signed   By: Malachy Moan M.D.   On: 03/07/2017 09:37   Ir US Guide Vasc Access Right  Result Date: 03/07/2017 INDICATION: 84 year old female with acute lower GI bleed with the site of bleeding localized to the region of the splenic flexure on tagged RBC scan. She presents for visceral angiography and possible embolization. EXAM: IR EMBO ARTERIAL NOT HEMORR HEMANG INC GUIDE ROADMAPPING; SELECTIVE VISCERAL ARTERIOGRAPHY; ADDITIONAL ARTERIOGRAPHY; IR ULTRASOUND GUIDANCE VASC ACCESS RIGHT 1. Ultrasound-guided vascular access, right common femoral artery 2. Celiac artery catheterization and angiogram (first order) 3. Superior mesenteric artery catheterization and angiogram (first order) 4. Inferior mesenteric artery catheterization and angiogram with additional sub selective arteriography as follows 5. Left colic artery catheterization and angiogram (second order) 6. Ascending  branch of the left colic artery catheterization and angiogram (third order) 7. Marginal artery catheterization and angiogram (third order, additional after basic) 8. Coil embolization of marginal artery 9. Catheterization of the descending branch of the left colic artery with angiogram (third order, additional after basic) 10. Catheterization of a distal branch of the ascending branch of the left colic artery with angiogram (third order, additional after basic) MEDICATIONS: None. ANESTHESIA/SEDATION: Moderate (conscious) sedation was employed during this procedure. A total of Versed 1.5 mg and Fentanyl 50 mcg was administered intravenously. Moderate Sedation Time: 44 minutes. The patient's level of consciousness and vital signs were monitored continuously by radiology nursing throughout the procedure under my direct supervision. CONTRAST:  Approximately 75 mL Isovue 300 FLUOROSCOPY TIME:  Fluoroscopy Time: 13 minutes 24 seconds (907 mGy). COMPLICATIONS: None immediate. PROCEDURE: Informed consent was obtained from the patient following explanation of the procedure, risks, benefits and alternatives. The patient understands, agrees and consents for the procedure. All questions were addressed. A time out was performed prior to the initiation of the procedure. Maximal barrier sterile technique utilized including caps, mask, sterile gowns, sterile gloves, large sterile drape, hand hygiene, and Betadine prep. The right common femoral artery was interrogated with ultrasound and found to be widely patent. An image was obtained and stored for the medical record. Local anesthesia was attained by infiltration with 1% lidocaine. A small dermatotomy was made. Under real-time sonographic guidance, the vessel was punctured with a 21 gauge micropuncture needle. Using standard technique, the initial micro needle was exchanged over a 0.018 micro wire for a transitional 4 [place] micro sheath. The micro sheath was then exchanged over a  0.035 wire for a 5 French vascular sheath. A Bentson wire was advanced in the abdominal aorta. A rim catheter was advanced over the wire. The rim catheter was used to select the inferior mesenteric artery. An arteriogram was performed. There is early nonvascular extravasation of contrast material arising from what appears to be the marginal artery at the splenic flexure. This corresponds with the site of bleeding seen on the recent tagged red blood cell study. Interestingly, there is a second site of apparent extravasation arising from a distal visceral branch of the ascending and division of the left colic artery in the proximal descending colon. This extravasation is less robust than the region  of extravasation in the marginal artery. A renegade ST microcatheter was then advanced over a Fathom 16 wire in used to select the left colic artery. A left colic arteriogram was performed. The anatomy of the ascending and descending branches was successfully identified. There is persistent contrast extravasation from the marginal artery. No definite contrast extravasation noted on this second angiogram in the region of the descending colon. The catheter was further advanced into the ascending branch of the left colic artery. Arteriography was performed confirming continued abnormal extravasation of contrast from the marginal artery. This has an ovoid appearance and may represent pooling within a dependent diverticulum versus fusiform aneurysmal dilatation. The microcatheter was successfully advanced into the marginal artery. Contrast demonstrates frank extravasation. Coil embolization was performed using a series of 2 and 3 mm fibered detachable interlock coils. Post embolization arteriography confirms complete occlusion of this vessel with no further contrast extravasation. No collateral filling. The microcatheter was brought back into the left colic artery and advanced into the descending division of the left colic  artery to further evaluate the other potential site of bleeding. Arteriography was performed. No evidence of bleeding in the proximal descending colon. The microcatheter was readvanced into the ascending branch and out into the distal visceral branch that appeared to be the site of bleeding on the initial angiogram. Arteriography was performed and carried out to the venous phase. Again, there is no evidence of bleeding at this site. No definite vessel abnormality was identified. No embolization was performed at this site. The microcatheter and rim catheter were removed. A C2 cobra catheter was advanced over a Bentson wire into the abdominal aorta and used to select the celiac artery. A celiac arteriogram was performed. Normal arterial anatomy. No evidence of replaced middle colic artery. No active bleeding. The superior mesenteric artery was then catheterized. Arteriography was performed. No evidence of back bleeding from the middle colic artery where it anastomosis with the left colic artery. No additional sites of bleeding were identified. The catheter was removed. A limited right common femoral arteriogram was performed. Hemostasis was attained with the assistance of an Angio-Seal device. IMPRESSION: 1. Positive angiography with potentially 2 sites of active bleeding in the region of the splenic flexure and proximal descending colon. 2. Successful coil embolization of the more definitive site of bleeding. 3. The second potential site of bleeding could not be identified on subsequent angiography and may have been artifactual. PLAN: 1. Patient is at risk for contrast induced nephropathy given baseline stage 3 chronic kidney disease. Recommend trending BUN and creatinine over the next 48 hours. Keep patient well hydrated. 2. Continue to trend H and H and transfuse as needed. 3. If there is evidence of recurrent or persistent active lower GI bleeding, repeat angiography should be considered. Signed, Sterling Big, MD [facility] Radiology Electronically Signed   By: Malachy Moan M.D.   On: 03/07/2017 09:37   Ir Embo Arterial Not Hemorr [employer] Guide Roadmapping  Result Date: 03/07/2017 INDICATION: 84 year old female with acute lower GI bleed with the site of bleeding localized to the region of the splenic flexure on tagged RBC scan. She presents for visceral angiography and possible embolization. EXAM: IR EMBO ARTERIAL NOT HEMORR HEMANG INC GUIDE ROADMAPPING; SELECTIVE VISCERAL ARTERIOGRAPHY; ADDITIONAL ARTERIOGRAPHY; IR ULTRASOUND GUIDANCE VASC ACCESS RIGHT 1. Ultrasound-guided vascular access, right common femoral artery 2. Celiac artery catheterization and angiogram (first order) 3. Superior mesenteric artery catheterization and angiogram (first order) 4. Inferior mesenteric  artery catheterization and angiogram with additional sub selective arteriography as follows 5. Left colic artery catheterization and angiogram (second order) 6. Ascending branch of the left colic artery catheterization and angiogram (third order) 7. Marginal artery catheterization and angiogram (third order, additional after basic) 8. Coil embolization of marginal artery 9. Catheterization of the descending branch of the left colic artery with angiogram (third order, additional after basic) 10. Catheterization of a distal branch of the ascending branch of the left colic artery with angiogram (third order, additional after basic) MEDICATIONS: None. ANESTHESIA/SEDATION: Moderate (conscious) sedation was employed during this procedure. A total of Versed 1.5 mg and Fentanyl 50 mcg was administered intravenously. Moderate Sedation Time: 44 minutes. The patient's level of consciousness and vital signs were monitored continuously by radiology nursing throughout the procedure under my direct supervision. CONTRAST:  Approximately 75 mL Isovue 300 FLUOROSCOPY TIME:  Fluoroscopy Time: 13  minutes 24 seconds (907 mGy). COMPLICATIONS: None immediate. PROCEDURE: Informed consent was obtained from the patient following explanation of the procedure, risks, benefits and alternatives. The patient understands, agrees and consents for the procedure. All questions were addressed. A time out was performed prior to the initiation of the procedure. Maximal barrier sterile technique utilized including caps, mask, sterile gowns, sterile gloves, large sterile drape, hand hygiene, and Betadine prep. The right common femoral artery was interrogated with ultrasound and found to be widely patent. An image was obtained and stored for the medical record. Local anesthesia was attained by infiltration with 1% lidocaine. A small dermatotomy was made. Under real-time sonographic guidance, the vessel was punctured with a 21 gauge micropuncture needle. Using standard technique, the initial micro needle was exchanged over a 0.018 micro wire for a transitional 4 [place] micro sheath. The micro sheath was then exchanged over a 0.035 wire for a 5 French vascular sheath. A Bentson wire was advanced in the abdominal aorta. A rim catheter was advanced over the wire. The rim catheter was used to select the inferior mesenteric artery. An arteriogram was performed. There is early nonvascular extravasation of contrast material arising from what appears to be the marginal artery at the splenic flexure. This corresponds with the site of bleeding seen on the recent tagged red blood cell study. Interestingly, there is a second site of apparent extravasation arising from a distal visceral branch of the ascending and division of the left colic artery in the proximal descending colon. This extravasation is less robust than the region of extravasation in the marginal artery. A renegade ST microcatheter was then advanced over a Fathom 16 wire in used to select the left colic artery. A left colic arteriogram was performed. The anatomy of the  ascending and descending branches was successfully identified. There is persistent contrast extravasation from the marginal artery. No definite contrast extravasation noted on this second angiogram in the region of the descending colon. The catheter was further advanced into the ascending branch of the left colic artery. Arteriography was performed confirming continued abnormal extravasation of contrast from the marginal artery. This has an ovoid appearance and may represent pooling within a dependent diverticulum versus fusiform aneurysmal dilatation. The microcatheter was successfully advanced into the marginal artery. Contrast demonstrates frank extravasation. Coil embolization was performed using a series of 2 and 3 mm fibered detachable interlock coils. Post embolization arteriography confirms complete occlusion of this vessel with no further contrast extravasation. No collateral filling. The microcatheter was brought back into the left colic artery and advanced into the descending division of the left colic  artery to further evaluate the other potential site of bleeding. Arteriography was performed. No evidence of bleeding in the proximal descending colon. The microcatheter was readvanced into the ascending branch and out into the distal visceral branch that appeared to be the site of bleeding on the initial angiogram. Arteriography was performed and carried out to the venous phase. Again, there is no evidence of bleeding at this site. No definite vessel abnormality was identified. No embolization was performed at this site. The microcatheter and rim catheter were removed. A C2 cobra catheter was advanced over a Bentson wire into the abdominal aorta and used to select the celiac artery. A celiac arteriogram was performed. Normal arterial anatomy. No evidence of replaced middle colic artery. No active bleeding. The superior mesenteric artery was then catheterized. Arteriography was performed. No evidence of  back bleeding from the middle colic artery where it anastomosis with the left colic artery. No additional sites of bleeding were identified. The catheter was removed. A limited right common femoral arteriogram was performed. Hemostasis was attained with the assistance of an Angio-Seal device. IMPRESSION: 1. Positive angiography with potentially 2 sites of active bleeding in the region of the splenic flexure and proximal descending colon. 2. Successful coil embolization of the more definitive site of bleeding. 3. The second potential site of bleeding could not be identified on subsequent angiography and may have been artifactual. PLAN: 1. Patient is at risk for contrast induced nephropathy given baseline stage 3 chronic kidney disease. Recommend trending BUN and creatinine over the next 48 hours. Keep patient well hydrated. 2. Continue to trend H and H and transfuse as needed. 3. If there is evidence of recurrent or persistent active lower GI bleeding, repeat angiography should be considered. Signed, Sterling Big, MD [facility] Radiology Electronically Signed   By: Malachy Moan M.D.   On: 03/07/2017 09:37    Scheduled Meds: Continuous Infusions: . sodium chloride    . sodium chloride 75 mL/hr at 03/06/17 1849     LOS: 1 day   Time spent: 25 minutes.  Hazeline Junker, MD [facility] Pager [phone]  If 7PM-7AM, please contact night-coverage [email or web address] Password [email or web address] 03/07/2017, 1:10 PM

## 2017-03-08 ENCOUNTER — Encounter (HOSPITAL_COMMUNITY): Payer: Self-pay | Admitting: Interventional Radiology

## 2017-03-08 ENCOUNTER — Other Ambulatory Visit: Payer: Self-pay

## 2017-03-08 ENCOUNTER — Inpatient Hospital Stay (HOSPITAL_COMMUNITY): Payer: Medicare Other

## 2017-03-08 HISTORY — PX: IR EMBO ART  VEN HEMORR LYMPH EXTRAV  INC GUIDE ROADMAPPING: IMG5450

## 2017-03-08 HISTORY — PX: IR ANGIOGRAM SELECTIVE EACH ADDITIONAL VESSEL: IMG667

## 2017-03-08 HISTORY — PX: IR ANGIOGRAM VISCERAL SELECTIVE: IMG657

## 2017-03-08 HISTORY — PX: IR US GUIDE VASC ACCESS RIGHT: IMG2390

## 2017-03-08 LAB — BASIC METABOLIC PANEL
Anion gap: 7 (ref 5–15)
BUN: 17 mg/dL (ref 6–20)
CO2: 23 mmol/L (ref 22–32)
Calcium: 7.8 mg/dL — ABNORMAL LOW (ref 8.9–10.3)
Chloride: 111 mmol/L (ref 101–111)
Creatinine, Ser: 1.28 mg/dL — ABNORMAL HIGH (ref 0.44–1.00)
GFR calc Af Amer: 41 mL/min — ABNORMAL LOW (ref >60)
GFR calc non Af Amer: 35 mL/min — ABNORMAL LOW (ref >60)
Glucose, Bld: 98 mg/dL (ref 65–99)
Potassium: 3.8 mmol/L (ref 3.5–5.1)
Sodium: 141 mmol/L (ref 135–145)

## 2017-03-08 LAB — HEMOGLOBIN AND HEMATOCRIT, BLOOD
HCT: 22.1 % — ABNORMAL LOW (ref 36.0–46.0)
HCT: 27.5 % — ABNORMAL LOW (ref 36.0–46.0)
Hemoglobin: 7.6 g/dL — ABNORMAL LOW (ref 12.0–15.0)
Hemoglobin: 9 g/dL — ABNORMAL LOW (ref 12.0–15.0)

## 2017-03-08 LAB — PREPARE RBC (CROSSMATCH)

## 2017-03-08 MED ORDER — LIDOCAINE HCL 1 % IJ SOLN
INTRAMUSCULAR | Status: AC | PRN
  Administered 2017-03-08: 10 mL

## 2017-03-08 MED ORDER — SODIUM CHLORIDE 0.9 % IV SOLN: Freq: Once | INTRAVENOUS | Status: DC

## 2017-03-08 MED ORDER — IOPAMIDOL (ISOVUE-300) INJECTION 61%
INTRAVENOUS | Status: AC
  Administered 2017-03-08: 60 mL
  Filled 2017-03-08: qty 100

## 2017-03-08 MED ORDER — FENTANYL CITRATE (PF) 100 MCG/2ML IJ SOLN
INTRAMUSCULAR | Status: AC
  Filled 2017-03-08: qty 4

## 2017-03-08 MED ORDER — MIDAZOLAM HCL 2 MG/2ML IJ SOLN
INTRAMUSCULAR | Status: AC | PRN
  Administered 2017-03-08 (×2): 0.5 mg via INTRAVENOUS
  Administered 2017-03-08: 1 mg via INTRAVENOUS

## 2017-03-08 MED ORDER — MIDAZOLAM HCL 2 MG/2ML IJ SOLN
INTRAMUSCULAR | Status: AC
  Filled 2017-03-08: qty 4

## 2017-03-08 MED ORDER — FENTANYL CITRATE (PF) 100 MCG/2ML IJ SOLN
INTRAMUSCULAR | Status: AC | PRN
  Administered 2017-03-08: 25 ug via INTRAVENOUS
  Administered 2017-03-08: 50 ug via INTRAVENOUS
  Administered 2017-03-08: 25 ug via INTRAVENOUS

## 2017-03-08 MED ORDER — LIDOCAINE HCL 1 % IJ SOLN
INTRAMUSCULAR | Status: AC
  Filled 2017-03-08: qty 20

## 2017-03-08 MED ORDER — IOPAMIDOL (ISOVUE-300) INJECTION 61%
INTRAVENOUS | Status: AC
  Administered 2017-03-08: 60 mL
  Filled 2017-03-08: qty 150

## 2017-03-08 NOTE — H&P (Signed)
Chief Complaint: Patient was seen in consultation today for mesenteric arteriogram with possible embolization Chief Complaint  Patient presents with  . GI Bleeding   at the request of Dr Aubery Lapping  Supervising Physician: Oley Balm  Patient Status: [facility] [place] - In-pt  History of Present Illness: [name] is a 84 y.o. female   Pt with low GI Bleed Embolization was performed  In IR just 03/06/17: IMPRESSION: 1. Positive angiography with potentially 2 sites of active bleeding in the region of the splenic flexure and proximal descending colon. 2. Successful coil embolization of the more definitive site of bleeding. 3. The second potential site of bleeding could not be identified on subsequent angiography and may have been artifactual  Pt was without bleeding til just today RN noting 2 BM with red blood approx 9 am Developed SOB with movement 2 units ordered per MD One running now  Past Medical History:  Diagnosis Date  . Acid reflux    takes Nexium daily  . Anemia    takes Ferrous Sulfate daily  . Aortic stenosis    mild AS pk grad 18, mean grad 9, AVA 1.32 cm 03/2015 echo (Dr. Jacinto Halim)  . Arthritis   . Chronic kidney disease (CKD), stage III (moderate) (HCC)   . Diverticulitis   . Hematochezia 03/2015  . High cholesterol    can't take the meds  . History of blood transfusion 03/2015   no abnormal reaction noted  . History of GI diverticular bleed   . HTN (hypertension)    takes Losartan-HCTZ and Metoprolol daily  . Joint pain   . Joint swelling   . Nocturia   . Numbness    in legs  . Peripheral edema    occasionally  . Shortness of breath dyspnea    occasionally and with exertion  . Slow urinary stream    at times    Past Surgical History:  Procedure Laterality Date  . ABCESS DRAINAGE     abdominal abcess  . cataract surgery Bilateral   . CHOLECYSTECTOMY    . DILATION AND CURETTAGE OF UTERUS    . ESOPHAGOGASTRODUODENOSCOPY N/A 02/23/2015   Procedure: ESOPHAGOGASTRODUODENOSCOPY (EGD);  Surgeon: Carman Ching, MD;  Location: [facility] ENDOSCOPY;  Service: Endoscopy;  Laterality: N/A;  . FLEXIBLE SIGMOIDOSCOPY N/A 02/24/2015   Procedure: FLEXIBLE SIGMOIDOSCOPY;  Surgeon: Carman Ching, MD;  Location: [facility] ENDOSCOPY;  Service: Endoscopy;  Laterality: N/A;  . IR ANGIOGRAM SELECTIVE EACH ADDITIONAL VESSEL  03/06/2017  . IR ANGIOGRAM SELECTIVE EACH ADDITIONAL VESSEL  03/06/2017  . IR ANGIOGRAM SELECTIVE EACH ADDITIONAL VESSEL  03/06/2017  . IR ANGIOGRAM VISCERAL SELECTIVE  03/06/2017  . IR ANGIOGRAM VISCERAL SELECTIVE  03/06/2017  . IR ANGIOGRAM VISCERAL SELECTIVE  03/06/2017  . IR EMBO ART  VEN HEMORR LYMPH EXTRAV  INC GUIDE ROADMAPPING  03/06/2017  . IR US GUIDE VASC ACCESS RIGHT  03/06/2017  . left knee surgery    . TOTAL HIP ARTHROPLASTY Left 05/17/2015   Procedure: LEFT TOTAL HIP ARTHROPLASTY ANTERIOR APPROACH;  Surgeon: Kathryne Hitch, MD;  Location: [facility] OR;  Service: Orthopedics;  Laterality: Left;    Allergies: Ezetimibe; Lipitor [atorvastatin]; Pravachol [pravastatin]; Statins; Zocor [simvastatin]; and Cholestyramine  Medications: Prior to Admission medications   Medication Sig Start Date End Date Taking? Authorizing Provider  acetaminophen (TYLENOL) 325 MG tablet Take 325 mg by mouth every 6 (six) hours as needed. pain   Yes [provider]  aspirin EC 81 MG tablet Take 81 mg by mouth  daily.   Yes [provider]  Cholecalciferol (VITAMIN D3) 2000 units TABS Take 2,000 Units by mouth daily.   Yes [provider]  diclofenac sodium (VOLTAREN) 1 % GEL Apply 2 g topically 2 (two) times daily.   Yes [provider]  famotidine (PEPCID) 40 MG tablet Take 40 mg by mouth daily. 02/12/17  Yes [provider]  ferrous sulfate 325 (65 FE) MG tablet Take 325 mg by mouth daily with breakfast.   Yes [provider]  furosemide (LASIX) 40 MG tablet Take 40 mg by mouth daily. 01/14/17  Yes  [provider]  losartan-hydrochlorothiazide (HYZAAR) 100-12.5 MG tablet Take 1 tablet by mouth daily.   Yes [provider]  metoprolol tartrate (LOPRESSOR) 25 MG tablet Take 25 mg by mouth 2 (two) times daily.   Yes [provider]  Multiple Vitamin (MULITIVITAMIN WITH MINERALS) TABS Take 1 tablet by mouth daily.   Yes [provider]  nitroGLYCERIN (NITROSTAT) 0.4 MG SL tablet Place 0.4 mg under the tongue every 5 (five) minutes as needed for chest pain.    Yes [provider]  polyethylene glycol powder (GLYCOLAX/MIRALAX) powder Take 17 g by mouth daily as needed for mild constipation.   Yes [provider]  PROAIR HFA 108 (90 Base) MCG/ACT inhaler Inhale 1-2 puffs into the lungs every 4 (four) hours as needed for shortness of breath or wheezing. 12/04/16  Yes [provider]     Family History  Problem Relation Age of Onset  . Diabetes Other   . Hypertension Other   . Cancer Other   . Heart disease Other     Social History   Socioeconomic History  . Marital status: Widowed    Spouse name: None  . Number of children: None  . Years of education: None  . Highest education level: None  Social Needs  . Financial resource strain: None  . Food insecurity - worry: None  . Food insecurity - inability: None  . Transportation needs - medical: None  . Transportation needs - non-medical: None  Occupational History  . None  Tobacco Use  . Smoking status: Former Games developer  . Smokeless tobacco: Never Used  . Tobacco comment: quit smoking 8+ yrs ago  Substance and Sexual Activity  . Alcohol use: No    Comment: quit yrs ago  . Drug use: No  . Sexual activity: Not Currently  Other Topics Concern  . None  Social History Narrative  . None    Review of Systems: A 12 point ROS discussed and pertinent positives are indicated in the HPI above.  All other systems are negative.  Review of Systems  Constitutional: Positive for  fatigue. Negative for activity change.  Respiratory: Positive for shortness of breath. Negative for cough and wheezing.   Cardiovascular: Negative for chest pain.  Gastrointestinal: Negative for abdominal pain.  Neurological: Positive for weakness.  Psychiatric/Behavioral: Negative for behavioral problems and confusion.    Vital Signs: BP (!) 149/69   Pulse 73   Temp 97.8 F (36.6 C) (Oral)   Resp (!) 21   Ht 5\' 3"  (1.6 m)   Wt 203 lb 3.2 oz (92.2 kg)   SpO2 97%   BMI 36.00 kg/m   Physical Exam  Constitutional: She is oriented to person, place, and time.  Cardiovascular: Normal rate and regular rhythm.  Murmur heard. Pulmonary/Chest: Effort normal and breath sounds normal.  Abdominal: Soft. Bowel sounds are normal.  Musculoskeletal: Normal range of  motion.  Neurological: She is alert and oriented to person, place, and time.  Skin: Skin is warm and dry.  Psychiatric: She has a normal mood and affect. Her behavior is normal. Judgment and thought content normal.  Nursing note and vitals reviewed.   Imaging: Nm Gi Blood Loss  Result Date: 03/06/2017 CLINICAL DATA:  84 year old female with GI bleeding, anemia. EXAM: NUCLEAR MEDICINE GASTROINTESTINAL BLEEDING SCAN TECHNIQUE: Sequential abdominal images were obtained following intravenous administration of Tc-70m labeled red blood cells. RADIOPHARMACEUTICALS:  25.4 mCi Tc-3m pertechnetate in-vitro labeled red cells. COMPARISON:  CT Abdomen and Pelvis 03/08/2015 FINDINGS: Physiologic blood pool activity is present, but almost immediately there is abnormal radiotracer activity within bowel loops of the left abdomen. This appears to originate at the splenic flexure, and then transit is in both the distal transverse colon and the descending colon to the sigmoid region. Widespread diverticulosis in the large bowel on the comparison CT Abdomen and Pelvis. IMPRESSION: Positive for active GI bleeding. Bleeding origin appears to be the splenic  flexure of colon. Critical Value/emergent results were called by telephone at the time of interpretation on 03/06/2017 at 1347 hr to Dr. Luberta Robertson in the ED, who verbally acknowledged these results. Electronically Signed   By: Odessa Fleming M.D.   On: 03/06/2017 14:04   Ir Angiogram Visceral Selective  Result Date: 03/07/2017 INDICATION: 84 year old female with acute lower GI bleed with the site of bleeding localized to the region of the splenic flexure on tagged RBC scan. She presents for visceral angiography and possible embolization. EXAM: IR EMBO ARTERIAL NOT HEMORR HEMANG INC GUIDE ROADMAPPING; SELECTIVE VISCERAL ARTERIOGRAPHY; ADDITIONAL ARTERIOGRAPHY; IR ULTRASOUND GUIDANCE VASC ACCESS RIGHT 1. Ultrasound-guided vascular access, right common femoral artery 2. Celiac artery catheterization and angiogram (first order) 3. Superior mesenteric artery catheterization and angiogram (first order) 4. Inferior mesenteric artery catheterization and angiogram with additional sub selective arteriography as follows 5. Left colic artery catheterization and angiogram (second order) 6. Ascending branch of the left colic artery catheterization and angiogram (third order) 7. Marginal artery catheterization and angiogram (third order, additional after basic) 8. Coil embolization of marginal artery 9. Catheterization of the descending branch of the left colic artery with angiogram (third order, additional after basic) 10. Catheterization of a distal branch of the ascending branch of the left colic artery with angiogram (third order, additional after basic) MEDICATIONS: None. ANESTHESIA/SEDATION: Moderate (conscious) sedation was employed during this procedure. A total of Versed 1.5 mg and Fentanyl 50 mcg was administered intravenously. Moderate Sedation Time: 44 minutes. The patient's level of consciousness and vital signs were monitored continuously by radiology nursing throughout the procedure under my direct supervision.  CONTRAST:  Approximately 75 mL Isovue 300 FLUOROSCOPY TIME:  Fluoroscopy Time: 13 minutes 24 seconds (907 mGy). COMPLICATIONS: None immediate. PROCEDURE: Informed consent was obtained from the patient following explanation of the procedure, risks, benefits and alternatives. The patient understands, agrees and consents for the procedure. All questions were addressed. A time out was performed prior to the initiation of the procedure. Maximal barrier sterile technique utilized including caps, mask, sterile gowns, sterile gloves, large sterile drape, hand hygiene, and Betadine prep. The right common femoral artery was interrogated with ultrasound and found to be widely patent. An image was obtained and stored for the medical record. Local anesthesia was attained by infiltration with 1% lidocaine. A small dermatotomy was made. Under real-time sonographic guidance, the vessel was punctured with a 21 gauge micropuncture needle. Using standard technique, the initial micro needle was exchanged  over a 0.018 micro wire for a transitional 4 [place] micro sheath. The micro sheath was then exchanged over a 0.035 wire for a 5 French vascular sheath. A Bentson wire was advanced in the abdominal aorta. A rim catheter was advanced over the wire. The rim catheter was used to select the inferior mesenteric artery. An arteriogram was performed. There is early nonvascular extravasation of contrast material arising from what appears to be the marginal artery at the splenic flexure. This corresponds with the site of bleeding seen on the recent tagged red blood cell study. Interestingly, there is a second site of apparent extravasation arising from a distal visceral branch of the ascending and division of the left colic artery in the proximal descending colon. This extravasation is less robust than the region of extravasation in the marginal artery. A renegade ST microcatheter was then advanced over a Fathom 16 wire in used to select the  left colic artery. A left colic arteriogram was performed. The anatomy of the ascending and descending branches was successfully identified. There is persistent contrast extravasation from the marginal artery. No definite contrast extravasation noted on this second angiogram in the region of the descending colon. The catheter was further advanced into the ascending branch of the left colic artery. Arteriography was performed confirming continued abnormal extravasation of contrast from the marginal artery. This has an ovoid appearance and may represent pooling within a dependent diverticulum versus fusiform aneurysmal dilatation. The microcatheter was successfully advanced into the marginal artery. Contrast demonstrates frank extravasation. Coil embolization was performed using a series of 2 and 3 mm fibered detachable interlock coils. Post embolization arteriography confirms complete occlusion of this vessel with no further contrast extravasation. No collateral filling. The microcatheter was brought back into the left colic artery and advanced into the descending division of the left colic artery to further evaluate the other potential site of bleeding. Arteriography was performed. No evidence of bleeding in the proximal descending colon. The microcatheter was readvanced into the ascending branch and out into the distal visceral branch that appeared to be the site of bleeding on the initial angiogram. Arteriography was performed and carried out to the venous phase. Again, there is no evidence of bleeding at this site. No definite vessel abnormality was identified. No embolization was performed at this site. The microcatheter and rim catheter were removed. A C2 cobra catheter was advanced over a Bentson wire into the abdominal aorta and used to select the celiac artery. A celiac arteriogram was performed. Normal arterial anatomy. No evidence of replaced middle colic artery. No active bleeding. The superior mesenteric  artery was then catheterized. Arteriography was performed. No evidence of back bleeding from the middle colic artery where it anastomosis with the left colic artery. No additional sites of bleeding were identified. The catheter was removed. A limited right common femoral arteriogram was performed. Hemostasis was attained with the assistance of an Angio-Seal device. IMPRESSION: 1. Positive angiography with potentially 2 sites of active bleeding in the region of the splenic flexure and proximal descending colon. 2. Successful coil embolization of the more definitive site of bleeding. 3. The second potential site of bleeding could not be identified on subsequent angiography and may have been artifactual. PLAN: 1. Patient is at risk for contrast induced nephropathy given baseline stage 3 chronic kidney disease. Recommend trending BUN and creatinine over the next 48 hours. Keep patient well hydrated. 2. Continue to trend H and H and transfuse as needed. 3. If there is evidence of  recurrent or persistent active lower GI bleeding, repeat angiography should be considered. Signed, Sterling Big, MD [facility] Radiology Electronically Signed   By: Malachy Moan M.D.   On: 03/07/2017 09:37   Ir Angiogram Visceral Selective  Result Date: 03/07/2017 INDICATION: 84 year old female with acute lower GI bleed with the site of bleeding localized to the region of the splenic flexure on tagged RBC scan. She presents for visceral angiography and possible embolization. EXAM: IR EMBO ARTERIAL NOT HEMORR HEMANG INC GUIDE ROADMAPPING; SELECTIVE VISCERAL ARTERIOGRAPHY; ADDITIONAL ARTERIOGRAPHY; IR ULTRASOUND GUIDANCE VASC ACCESS RIGHT 1. Ultrasound-guided vascular access, right common femoral artery 2. Celiac artery catheterization and angiogram (first order) 3. Superior mesenteric artery catheterization and angiogram (first order) 4. Inferior mesenteric artery catheterization  and angiogram with additional sub selective arteriography as follows 5. Left colic artery catheterization and angiogram (second order) 6. Ascending branch of the left colic artery catheterization and angiogram (third order) 7. Marginal artery catheterization and angiogram (third order, additional after basic) 8. Coil embolization of marginal artery 9. Catheterization of the descending branch of the left colic artery with angiogram (third order, additional after basic) 10. Catheterization of a distal branch of the ascending branch of the left colic artery with angiogram (third order, additional after basic) MEDICATIONS: None. ANESTHESIA/SEDATION: Moderate (conscious) sedation was employed during this procedure. A total of Versed 1.5 mg and Fentanyl 50 mcg was administered intravenously. Moderate Sedation Time: 44 minutes. The patient's level of consciousness and vital signs were monitored continuously by radiology nursing throughout the procedure under my direct supervision. CONTRAST:  Approximately 75 mL Isovue 300 FLUOROSCOPY TIME:  Fluoroscopy Time: 13 minutes 24 seconds (907 mGy). COMPLICATIONS: None immediate. PROCEDURE: Informed consent was obtained from the patient following explanation of the procedure, risks, benefits and alternatives. The patient understands, agrees and consents for the procedure. All questions were addressed. A time out was performed prior to the initiation of the procedure. Maximal barrier sterile technique utilized including caps, mask, sterile gowns, sterile gloves, large sterile drape, hand hygiene, and Betadine prep. The right common femoral artery was interrogated with ultrasound and found to be widely patent. An image was obtained and stored for the medical record. Local anesthesia was attained by infiltration with 1% lidocaine. A small dermatotomy was made. Under real-time sonographic guidance, the vessel was punctured with a 21 gauge micropuncture needle. Using standard technique,  the initial micro needle was exchanged over a 0.018 micro wire for a transitional 4 [place] micro sheath. The micro sheath was then exchanged over a 0.035 wire for a 5 French vascular sheath. A Bentson wire was advanced in the abdominal aorta. A rim catheter was advanced over the wire. The rim catheter was used to select the inferior mesenteric artery. An arteriogram was performed. There is early nonvascular extravasation of contrast material arising from what appears to be the marginal artery at the splenic flexure. This corresponds with the site of bleeding seen on the recent tagged red blood cell study. Interestingly, there is a second site of apparent extravasation arising from a distal visceral branch of the ascending and division of the left colic artery in the proximal descending colon. This extravasation is less robust than the region of extravasation in the marginal artery. A renegade ST microcatheter was then advanced over a Fathom 16 wire in used to select the left colic artery. A left colic arteriogram was performed. The anatomy of the ascending and descending branches was successfully identified. There is persistent contrast extravasation  from the marginal artery. No definite contrast extravasation noted on this second angiogram in the region of the descending colon. The catheter was further advanced into the ascending branch of the left colic artery. Arteriography was performed confirming continued abnormal extravasation of contrast from the marginal artery. This has an ovoid appearance and may represent pooling within a dependent diverticulum versus fusiform aneurysmal dilatation. The microcatheter was successfully advanced into the marginal artery. Contrast demonstrates frank extravasation. Coil embolization was performed using a series of 2 and 3 mm fibered detachable interlock coils. Post embolization arteriography confirms complete occlusion of this vessel with no further contrast extravasation. No  collateral filling. The microcatheter was brought back into the left colic artery and advanced into the descending division of the left colic artery to further evaluate the other potential site of bleeding. Arteriography was performed. No evidence of bleeding in the proximal descending colon. The microcatheter was readvanced into the ascending branch and out into the distal visceral branch that appeared to be the site of bleeding on the initial angiogram. Arteriography was performed and carried out to the venous phase. Again, there is no evidence of bleeding at this site. No definite vessel abnormality was identified. No embolization was performed at this site. The microcatheter and rim catheter were removed. A C2 cobra catheter was advanced over a Bentson wire into the abdominal aorta and used to select the celiac artery. A celiac arteriogram was performed. Normal arterial anatomy. No evidence of replaced middle colic artery. No active bleeding. The superior mesenteric artery was then catheterized. Arteriography was performed. No evidence of back bleeding from the middle colic artery where it anastomosis with the left colic artery. No additional sites of bleeding were identified. The catheter was removed. A limited right common femoral arteriogram was performed. Hemostasis was attained with the assistance of an Angio-Seal device. IMPRESSION: 1. Positive angiography with potentially 2 sites of active bleeding in the region of the splenic flexure and proximal descending colon. 2. Successful coil embolization of the more definitive site of bleeding. 3. The second potential site of bleeding could not be identified on subsequent angiography and may have been artifactual. PLAN: 1. Patient is at risk for contrast induced nephropathy given baseline stage 3 chronic kidney disease. Recommend trending BUN and creatinine over the next 48 hours. Keep patient well hydrated. 2. Continue to trend H and H and transfuse as needed.  3. If there is evidence of recurrent or persistent active lower GI bleeding, repeat angiography should be considered. Signed, Sterling Big, MD [facility] Radiology Electronically Signed   By: Malachy Moan M.D.   On: 03/07/2017 09:37   Ir Angiogram Visceral Selective  Result Date: 03/07/2017 INDICATION: 84 year old female with acute lower GI bleed with the site of bleeding localized to the region of the splenic flexure on tagged RBC scan. She presents for visceral angiography and possible embolization. EXAM: IR EMBO ARTERIAL NOT HEMORR HEMANG INC GUIDE ROADMAPPING; SELECTIVE VISCERAL ARTERIOGRAPHY; ADDITIONAL ARTERIOGRAPHY; IR ULTRASOUND GUIDANCE VASC ACCESS RIGHT 1. Ultrasound-guided vascular access, right common femoral artery 2. Celiac artery catheterization and angiogram (first order) 3. Superior mesenteric artery catheterization and angiogram (first order) 4. Inferior mesenteric artery catheterization and angiogram with additional sub selective arteriography as follows 5. Left colic artery catheterization and angiogram (second order) 6. Ascending branch of the left colic artery catheterization and angiogram (third order) 7. Marginal artery catheterization and angiogram (third order, additional after basic) 8. Coil embolization of marginal artery 9. Catheterization of  the descending branch of the left colic artery with angiogram (third order, additional after basic) 10. Catheterization of a distal branch of the ascending branch of the left colic artery with angiogram (third order, additional after basic) MEDICATIONS: None. ANESTHESIA/SEDATION: Moderate (conscious) sedation was employed during this procedure. A total of Versed 1.5 mg and Fentanyl 50 mcg was administered intravenously. Moderate Sedation Time: 44 minutes. The patient's level of consciousness and vital signs were monitored continuously by radiology nursing throughout the procedure  under my direct supervision. CONTRAST:  Approximately 75 mL Isovue 300 FLUOROSCOPY TIME:  Fluoroscopy Time: 13 minutes 24 seconds (907 mGy). COMPLICATIONS: None immediate. PROCEDURE: Informed consent was obtained from the patient following explanation of the procedure, risks, benefits and alternatives. The patient understands, agrees and consents for the procedure. All questions were addressed. A time out was performed prior to the initiation of the procedure. Maximal barrier sterile technique utilized including caps, mask, sterile gowns, sterile gloves, large sterile drape, hand hygiene, and Betadine prep. The right common femoral artery was interrogated with ultrasound and found to be widely patent. An image was obtained and stored for the medical record. Local anesthesia was attained by infiltration with 1% lidocaine. A small dermatotomy was made. Under real-time sonographic guidance, the vessel was punctured with a 21 gauge micropuncture needle. Using standard technique, the initial micro needle was exchanged over a 0.018 micro wire for a transitional 4 [place] micro sheath. The micro sheath was then exchanged over a 0.035 wire for a 5 French vascular sheath. A Bentson wire was advanced in the abdominal aorta. A rim catheter was advanced over the wire. The rim catheter was used to select the inferior mesenteric artery. An arteriogram was performed. There is early nonvascular extravasation of contrast material arising from what appears to be the marginal artery at the splenic flexure. This corresponds with the site of bleeding seen on the recent tagged red blood cell study. Interestingly, there is a second site of apparent extravasation arising from a distal visceral branch of the ascending and division of the left colic artery in the proximal descending colon. This extravasation is less robust than the region of extravasation in the marginal artery. A renegade ST microcatheter was then advanced over a Fathom 16  wire in used to select the left colic artery. A left colic arteriogram was performed. The anatomy of the ascending and descending branches was successfully identified. There is persistent contrast extravasation from the marginal artery. No definite contrast extravasation noted on this second angiogram in the region of the descending colon. The catheter was further advanced into the ascending branch of the left colic artery. Arteriography was performed confirming continued abnormal extravasation of contrast from the marginal artery. This has an ovoid appearance and may represent pooling within a dependent diverticulum versus fusiform aneurysmal dilatation. The microcatheter was successfully advanced into the marginal artery. Contrast demonstrates frank extravasation. Coil embolization was performed using a series of 2 and 3 mm fibered detachable interlock coils. Post embolization arteriography confirms complete occlusion of this vessel with no further contrast extravasation. No collateral filling. The microcatheter was brought back into the left colic artery and advanced into the descending division of the left colic artery to further evaluate the other potential site of bleeding. Arteriography was performed. No evidence of bleeding in the proximal descending colon. The microcatheter was readvanced into the ascending branch and out into the distal visceral branch that appeared to be the site of bleeding on the initial angiogram. Arteriography was performed and  carried out to the venous phase. Again, there is no evidence of bleeding at this site. No definite vessel abnormality was identified. No embolization was performed at this site. The microcatheter and rim catheter were removed. A C2 cobra catheter was advanced over a Bentson wire into the abdominal aorta and used to select the celiac artery. A celiac arteriogram was performed. Normal arterial anatomy. No evidence of replaced middle colic artery. No active  bleeding. The superior mesenteric artery was then catheterized. Arteriography was performed. No evidence of back bleeding from the middle colic artery where it anastomosis with the left colic artery. No additional sites of bleeding were identified. The catheter was removed. A limited right common femoral arteriogram was performed. Hemostasis was attained with the assistance of an Angio-Seal device. IMPRESSION: 1. Positive angiography with potentially 2 sites of active bleeding in the region of the splenic flexure and proximal descending colon. 2. Successful coil embolization of the more definitive site of bleeding. 3. The second potential site of bleeding could not be identified on subsequent angiography and may have been artifactual. PLAN: 1. Patient is at risk for contrast induced nephropathy given baseline stage 3 chronic kidney disease. Recommend trending BUN and creatinine over the next 48 hours. Keep patient well hydrated. 2. Continue to trend H and H and transfuse as needed. 3. If there is evidence of recurrent or persistent active lower GI bleeding, repeat angiography should be considered. Signed, Sterling Big, MD [facility] Radiology Electronically Signed   By: Malachy Moan M.D.   On: 03/07/2017 09:37   Ir Angiogram Selective Each Additional Vessel  Result Date: 03/07/2017 INDICATION: 84 year old female with acute lower GI bleed with the site of bleeding localized to the region of the splenic flexure on tagged RBC scan. She presents for visceral angiography and possible embolization. EXAM: IR EMBO ARTERIAL NOT HEMORR HEMANG INC GUIDE ROADMAPPING; SELECTIVE VISCERAL ARTERIOGRAPHY; ADDITIONAL ARTERIOGRAPHY; IR ULTRASOUND GUIDANCE VASC ACCESS RIGHT 1. Ultrasound-guided vascular access, right common femoral artery 2. Celiac artery catheterization and angiogram (first order) 3. Superior mesenteric artery catheterization and angiogram (first order)  4. Inferior mesenteric artery catheterization and angiogram with additional sub selective arteriography as follows 5. Left colic artery catheterization and angiogram (second order) 6. Ascending branch of the left colic artery catheterization and angiogram (third order) 7. Marginal artery catheterization and angiogram (third order, additional after basic) 8. Coil embolization of marginal artery 9. Catheterization of the descending branch of the left colic artery with angiogram (third order, additional after basic) 10. Catheterization of a distal branch of the ascending branch of the left colic artery with angiogram (third order, additional after basic) MEDICATIONS: None. ANESTHESIA/SEDATION: Moderate (conscious) sedation was employed during this procedure. A total of Versed 1.5 mg and Fentanyl 50 mcg was administered intravenously. Moderate Sedation Time: 44 minutes. The patient's level of consciousness and vital signs were monitored continuously by radiology nursing throughout the procedure under my direct supervision. CONTRAST:  Approximately 75 mL Isovue 300 FLUOROSCOPY TIME:  Fluoroscopy Time: 13 minutes 24 seconds (907 mGy). COMPLICATIONS: None immediate. PROCEDURE: Informed consent was obtained from the patient following explanation of the procedure, risks, benefits and alternatives. The patient understands, agrees and consents for the procedure. All questions were addressed. A time out was performed prior to the initiation of the procedure. Maximal barrier sterile technique utilized including caps, mask, sterile gowns, sterile gloves, large sterile drape, hand hygiene, and Betadine prep. The right common femoral artery was interrogated with ultrasound and found  to be widely patent. An image was obtained and stored for the medical record. Local anesthesia was attained by infiltration with 1% lidocaine. A small dermatotomy was made. Under real-time sonographic guidance, the vessel was punctured with a 21 gauge  micropuncture needle. Using standard technique, the initial micro needle was exchanged over a 0.018 micro wire for a transitional 4 [place] micro sheath. The micro sheath was then exchanged over a 0.035 wire for a 5 French vascular sheath. A Bentson wire was advanced in the abdominal aorta. A rim catheter was advanced over the wire. The rim catheter was used to select the inferior mesenteric artery. An arteriogram was performed. There is early nonvascular extravasation of contrast material arising from what appears to be the marginal artery at the splenic flexure. This corresponds with the site of bleeding seen on the recent tagged red blood cell study. Interestingly, there is a second site of apparent extravasation arising from a distal visceral branch of the ascending and division of the left colic artery in the proximal descending colon. This extravasation is less robust than the region of extravasation in the marginal artery. A renegade ST microcatheter was then advanced over a Fathom 16 wire in used to select the left colic artery. A left colic arteriogram was performed. The anatomy of the ascending and descending branches was successfully identified. There is persistent contrast extravasation from the marginal artery. No definite contrast extravasation noted on this second angiogram in the region of the descending colon. The catheter was further advanced into the ascending branch of the left colic artery. Arteriography was performed confirming continued abnormal extravasation of contrast from the marginal artery. This has an ovoid appearance and may represent pooling within a dependent diverticulum versus fusiform aneurysmal dilatation. The microcatheter was successfully advanced into the marginal artery. Contrast demonstrates frank extravasation. Coil embolization was performed using a series of 2 and 3 mm fibered detachable interlock coils. Post embolization arteriography confirms complete occlusion of this  vessel with no further contrast extravasation. No collateral filling. The microcatheter was brought back into the left colic artery and advanced into the descending division of the left colic artery to further evaluate the other potential site of bleeding. Arteriography was performed. No evidence of bleeding in the proximal descending colon. The microcatheter was readvanced into the ascending branch and out into the distal visceral branch that appeared to be the site of bleeding on the initial angiogram. Arteriography was performed and carried out to the venous phase. Again, there is no evidence of bleeding at this site. No definite vessel abnormality was identified. No embolization was performed at this site. The microcatheter and rim catheter were removed. A C2 cobra catheter was advanced over a Bentson wire into the abdominal aorta and used to select the celiac artery. A celiac arteriogram was performed. Normal arterial anatomy. No evidence of replaced middle colic artery. No active bleeding. The superior mesenteric artery was then catheterized. Arteriography was performed. No evidence of back bleeding from the middle colic artery where it anastomosis with the left colic artery. No additional sites of bleeding were identified. The catheter was removed. A limited right common femoral arteriogram was performed. Hemostasis was attained with the assistance of an Angio-Seal device. IMPRESSION: 1. Positive angiography with potentially 2 sites of active bleeding in the region of the splenic flexure and proximal descending colon. 2. Successful coil embolization of the more definitive site of bleeding. 3. The second potential site of bleeding could not be identified on subsequent angiography and may  have been artifactual. PLAN: 1. Patient is at risk for contrast induced nephropathy given baseline stage 3 chronic kidney disease. Recommend trending BUN and creatinine over the next 48 hours. Keep patient well hydrated. 2.  Continue to trend H and H and transfuse as needed. 3. If there is evidence of recurrent or persistent active lower GI bleeding, repeat angiography should be considered. Signed, Sterling Big, MD [facility] Radiology Electronically Signed   By: Malachy Moan M.D.   On: 03/07/2017 09:37   Ir Angiogram Selective Each Additional Vessel  Result Date: 03/07/2017 INDICATION: 84 year old female with acute lower GI bleed with the site of bleeding localized to the region of the splenic flexure on tagged RBC scan. She presents for visceral angiography and possible embolization. EXAM: IR EMBO ARTERIAL NOT HEMORR HEMANG INC GUIDE ROADMAPPING; SELECTIVE VISCERAL ARTERIOGRAPHY; ADDITIONAL ARTERIOGRAPHY; IR ULTRASOUND GUIDANCE VASC ACCESS RIGHT 1. Ultrasound-guided vascular access, right common femoral artery 2. Celiac artery catheterization and angiogram (first order) 3. Superior mesenteric artery catheterization and angiogram (first order) 4. Inferior mesenteric artery catheterization and angiogram with additional sub selective arteriography as follows 5. Left colic artery catheterization and angiogram (second order) 6. Ascending branch of the left colic artery catheterization and angiogram (third order) 7. Marginal artery catheterization and angiogram (third order, additional after basic) 8. Coil embolization of marginal artery 9. Catheterization of the descending branch of the left colic artery with angiogram (third order, additional after basic) 10. Catheterization of a distal branch of the ascending branch of the left colic artery with angiogram (third order, additional after basic) MEDICATIONS: None. ANESTHESIA/SEDATION: Moderate (conscious) sedation was employed during this procedure. A total of Versed 1.5 mg and Fentanyl 50 mcg was administered intravenously. Moderate Sedation Time: 44 minutes. The patient's level of consciousness and vital signs were  monitored continuously by radiology nursing throughout the procedure under my direct supervision. CONTRAST:  Approximately 75 mL Isovue 300 FLUOROSCOPY TIME:  Fluoroscopy Time: 13 minutes 24 seconds (907 mGy). COMPLICATIONS: None immediate. PROCEDURE: Informed consent was obtained from the patient following explanation of the procedure, risks, benefits and alternatives. The patient understands, agrees and consents for the procedure. All questions were addressed. A time out was performed prior to the initiation of the procedure. Maximal barrier sterile technique utilized including caps, mask, sterile gowns, sterile gloves, large sterile drape, hand hygiene, and Betadine prep. The right common femoral artery was interrogated with ultrasound and found to be widely patent. An image was obtained and stored for the medical record. Local anesthesia was attained by infiltration with 1% lidocaine. A small dermatotomy was made. Under real-time sonographic guidance, the vessel was punctured with a 21 gauge micropuncture needle. Using standard technique, the initial micro needle was exchanged over a 0.018 micro wire for a transitional 4 [place] micro sheath. The micro sheath was then exchanged over a 0.035 wire for a 5 French vascular sheath. A Bentson wire was advanced in the abdominal aorta. A rim catheter was advanced over the wire. The rim catheter was used to select the inferior mesenteric artery. An arteriogram was performed. There is early nonvascular extravasation of contrast material arising from what appears to be the marginal artery at the splenic flexure. This corresponds with the site of bleeding seen on the recent tagged red blood cell study. Interestingly, there is a second site of apparent extravasation arising from a distal visceral branch of the ascending and division of the left colic artery in the proximal descending colon. This extravasation is  less robust than the region of extravasation in the marginal  artery. A renegade ST microcatheter was then advanced over a Fathom 16 wire in used to select the left colic artery. A left colic arteriogram was performed. The anatomy of the ascending and descending branches was successfully identified. There is persistent contrast extravasation from the marginal artery. No definite contrast extravasation noted on this second angiogram in the region of the descending colon. The catheter was further advanced into the ascending branch of the left colic artery. Arteriography was performed confirming continued abnormal extravasation of contrast from the marginal artery. This has an ovoid appearance and may represent pooling within a dependent diverticulum versus fusiform aneurysmal dilatation. The microcatheter was successfully advanced into the marginal artery. Contrast demonstrates frank extravasation. Coil embolization was performed using a series of 2 and 3 mm fibered detachable interlock coils. Post embolization arteriography confirms complete occlusion of this vessel with no further contrast extravasation. No collateral filling. The microcatheter was brought back into the left colic artery and advanced into the descending division of the left colic artery to further evaluate the other potential site of bleeding. Arteriography was performed. No evidence of bleeding in the proximal descending colon. The microcatheter was readvanced into the ascending branch and out into the distal visceral branch that appeared to be the site of bleeding on the initial angiogram. Arteriography was performed and carried out to the venous phase. Again, there is no evidence of bleeding at this site. No definite vessel abnormality was identified. No embolization was performed at this site. The microcatheter and rim catheter were removed. A C2 cobra catheter was advanced over a Bentson wire into the abdominal aorta and used to select the celiac artery. A celiac arteriogram was performed. Normal arterial  anatomy. No evidence of replaced middle colic artery. No active bleeding. The superior mesenteric artery was then catheterized. Arteriography was performed. No evidence of back bleeding from the middle colic artery where it anastomosis with the left colic artery. No additional sites of bleeding were identified. The catheter was removed. A limited right common femoral arteriogram was performed. Hemostasis was attained with the assistance of an Angio-Seal device. IMPRESSION: 1. Positive angiography with potentially 2 sites of active bleeding in the region of the splenic flexure and proximal descending colon. 2. Successful coil embolization of the more definitive site of bleeding. 3. The second potential site of bleeding could not be identified on subsequent angiography and may have been artifactual. PLAN: 1. Patient is at risk for contrast induced nephropathy given baseline stage 3 chronic kidney disease. Recommend trending BUN and creatinine over the next 48 hours. Keep patient well hydrated. 2. Continue to trend H and H and transfuse as needed. 3. If there is evidence of recurrent or persistent active lower GI bleeding, repeat angiography should be considered. Signed, Sterling Big, MD [facility] Radiology Electronically Signed   By: Malachy Moan M.D.   On: 03/07/2017 09:37   Ir Angiogram Selective Each Additional Vessel  Result Date: 03/07/2017 INDICATION: 84 year old female with acute lower GI bleed with the site of bleeding localized to the region of the splenic flexure on tagged RBC scan. She presents for visceral angiography and possible embolization. EXAM: IR EMBO ARTERIAL NOT HEMORR HEMANG INC GUIDE ROADMAPPING; SELECTIVE VISCERAL ARTERIOGRAPHY; ADDITIONAL ARTERIOGRAPHY; IR ULTRASOUND GUIDANCE VASC ACCESS RIGHT 1. Ultrasound-guided vascular access, right common femoral artery 2. Celiac artery catheterization and angiogram (first order) 3.  Superior mesenteric artery catheterization and angiogram (first order)  4. Inferior mesenteric artery catheterization and angiogram with additional sub selective arteriography as follows 5. Left colic artery catheterization and angiogram (second order) 6. Ascending branch of the left colic artery catheterization and angiogram (third order) 7. Marginal artery catheterization and angiogram (third order, additional after basic) 8. Coil embolization of marginal artery 9. Catheterization of the descending branch of the left colic artery with angiogram (third order, additional after basic) 10. Catheterization of a distal branch of the ascending branch of the left colic artery with angiogram (third order, additional after basic) MEDICATIONS: None. ANESTHESIA/SEDATION: Moderate (conscious) sedation was employed during this procedure. A total of Versed 1.5 mg and Fentanyl 50 mcg was administered intravenously. Moderate Sedation Time: 44 minutes. The patient's level of consciousness and vital signs were monitored continuously by radiology nursing throughout the procedure under my direct supervision. CONTRAST:  Approximately 75 mL Isovue 300 FLUOROSCOPY TIME:  Fluoroscopy Time: 13 minutes 24 seconds (907 mGy). COMPLICATIONS: None immediate. PROCEDURE: Informed consent was obtained from the patient following explanation of the procedure, risks, benefits and alternatives. The patient understands, agrees and consents for the procedure. All questions were addressed. A time out was performed prior to the initiation of the procedure. Maximal barrier sterile technique utilized including caps, mask, sterile gowns, sterile gloves, large sterile drape, hand hygiene, and Betadine prep. The right common femoral artery was interrogated with ultrasound and found to be widely patent. An image was obtained and stored for the medical record. Local anesthesia was attained by infiltration with 1% lidocaine. A small dermatotomy was made. Under  real-time sonographic guidance, the vessel was punctured with a 21 gauge micropuncture needle. Using standard technique, the initial micro needle was exchanged over a 0.018 micro wire for a transitional 4 [place] micro sheath. The micro sheath was then exchanged over a 0.035 wire for a 5 French vascular sheath. A Bentson wire was advanced in the abdominal aorta. A rim catheter was advanced over the wire. The rim catheter was used to select the inferior mesenteric artery. An arteriogram was performed. There is early nonvascular extravasation of contrast material arising from what appears to be the marginal artery at the splenic flexure. This corresponds with the site of bleeding seen on the recent tagged red blood cell study. Interestingly, there is a second site of apparent extravasation arising from a distal visceral branch of the ascending and division of the left colic artery in the proximal descending colon. This extravasation is less robust than the region of extravasation in the marginal artery. A renegade ST microcatheter was then advanced over a Fathom 16 wire in used to select the left colic artery. A left colic arteriogram was performed. The anatomy of the ascending and descending branches was successfully identified. There is persistent contrast extravasation from the marginal artery. No definite contrast extravasation noted on this second angiogram in the region of the descending colon. The catheter was further advanced into the ascending branch of the left colic artery. Arteriography was performed confirming continued abnormal extravasation of contrast from the marginal artery. This has an ovoid appearance and may represent pooling within a dependent diverticulum versus fusiform aneurysmal dilatation. The microcatheter was successfully advanced into the marginal artery. Contrast demonstrates frank extravasation. Coil embolization was performed using a series of 2 and 3 mm fibered detachable interlock  coils. Post embolization arteriography confirms complete occlusion of this vessel with no further contrast extravasation. No collateral filling. The microcatheter was brought back into the left colic artery and advanced into the descending division of  the left colic artery to further evaluate the other potential site of bleeding. Arteriography was performed. No evidence of bleeding in the proximal descending colon. The microcatheter was readvanced into the ascending branch and out into the distal visceral branch that appeared to be the site of bleeding on the initial angiogram. Arteriography was performed and carried out to the venous phase. Again, there is no evidence of bleeding at this site. No definite vessel abnormality was identified. No embolization was performed at this site. The microcatheter and rim catheter were removed. A C2 cobra catheter was advanced over a Bentson wire into the abdominal aorta and used to select the celiac artery. A celiac arteriogram was performed. Normal arterial anatomy. No evidence of replaced middle colic artery. No active bleeding. The superior mesenteric artery was then catheterized. Arteriography was performed. No evidence of back bleeding from the middle colic artery where it anastomosis with the left colic artery. No additional sites of bleeding were identified. The catheter was removed. A limited right common femoral arteriogram was performed. Hemostasis was attained with the assistance of an Angio-Seal device. IMPRESSION: 1. Positive angiography with potentially 2 sites of active bleeding in the region of the splenic flexure and proximal descending colon. 2. Successful coil embolization of the more definitive site of bleeding. 3. The second potential site of bleeding could not be identified on subsequent angiography and may have been artifactual. PLAN: 1. Patient is at risk for contrast induced nephropathy given baseline stage 3 chronic kidney disease. Recommend trending  BUN and creatinine over the next 48 hours. Keep patient well hydrated. 2. Continue to trend H and H and transfuse as needed. 3. If there is evidence of recurrent or persistent active lower GI bleeding, repeat angiography should be considered. Signed, Sterling Big, MD [facility] Radiology Electronically Signed   By: Malachy Moan M.D.   On: 03/07/2017 09:37   Ir US Guide Vasc Access Right  Result Date: 03/07/2017 INDICATION: 84 year old female with acute lower GI bleed with the site of bleeding localized to the region of the splenic flexure on tagged RBC scan. She presents for visceral angiography and possible embolization. EXAM: IR EMBO ARTERIAL NOT HEMORR HEMANG INC GUIDE ROADMAPPING; SELECTIVE VISCERAL ARTERIOGRAPHY; ADDITIONAL ARTERIOGRAPHY; IR ULTRASOUND GUIDANCE VASC ACCESS RIGHT 1. Ultrasound-guided vascular access, right common femoral artery 2. Celiac artery catheterization and angiogram (first order) 3. Superior mesenteric artery catheterization and angiogram (first order) 4. Inferior mesenteric artery catheterization and angiogram with additional sub selective arteriography as follows 5. Left colic artery catheterization and angiogram (second order) 6. Ascending branch of the left colic artery catheterization and angiogram (third order) 7. Marginal artery catheterization and angiogram (third order, additional after basic) 8. Coil embolization of marginal artery 9. Catheterization of the descending branch of the left colic artery with angiogram (third order, additional after basic) 10. Catheterization of a distal branch of the ascending branch of the left colic artery with angiogram (third order, additional after basic) MEDICATIONS: None. ANESTHESIA/SEDATION: Moderate (conscious) sedation was employed during this procedure. A total of Versed 1.5 mg and Fentanyl 50 mcg was administered intravenously. Moderate Sedation Time: 44 minutes. The  patient's level of consciousness and vital signs were monitored continuously by radiology nursing throughout the procedure under my direct supervision. CONTRAST:  Approximately 75 mL Isovue 300 FLUOROSCOPY TIME:  Fluoroscopy Time: 13 minutes 24 seconds (907 mGy). COMPLICATIONS: None immediate. PROCEDURE: Informed consent was obtained from the patient following explanation of the procedure, risks, benefits and alternatives.  The patient understands, agrees and consents for the procedure. All questions were addressed. A time out was performed prior to the initiation of the procedure. Maximal barrier sterile technique utilized including caps, mask, sterile gowns, sterile gloves, large sterile drape, hand hygiene, and Betadine prep. The right common femoral artery was interrogated with ultrasound and found to be widely patent. An image was obtained and stored for the medical record. Local anesthesia was attained by infiltration with 1% lidocaine. A small dermatotomy was made. Under real-time sonographic guidance, the vessel was punctured with a 21 gauge micropuncture needle. Using standard technique, the initial micro needle was exchanged over a 0.018 micro wire for a transitional 4 [place] micro sheath. The micro sheath was then exchanged over a 0.035 wire for a 5 French vascular sheath. A Bentson wire was advanced in the abdominal aorta. A rim catheter was advanced over the wire. The rim catheter was used to select the inferior mesenteric artery. An arteriogram was performed. There is early nonvascular extravasation of contrast material arising from what appears to be the marginal artery at the splenic flexure. This corresponds with the site of bleeding seen on the recent tagged red blood cell study. Interestingly, there is a second site of apparent extravasation arising from a distal visceral branch of the ascending and division of the left colic artery in the proximal descending colon. This extravasation is less  robust than the region of extravasation in the marginal artery. A renegade ST microcatheter was then advanced over a Fathom 16 wire in used to select the left colic artery. A left colic arteriogram was performed. The anatomy of the ascending and descending branches was successfully identified. There is persistent contrast extravasation from the marginal artery. No definite contrast extravasation noted on this second angiogram in the region of the descending colon. The catheter was further advanced into the ascending branch of the left colic artery. Arteriography was performed confirming continued abnormal extravasation of contrast from the marginal artery. This has an ovoid appearance and may represent pooling within a dependent diverticulum versus fusiform aneurysmal dilatation. The microcatheter was successfully advanced into the marginal artery. Contrast demonstrates frank extravasation. Coil embolization was performed using a series of 2 and 3 mm fibered detachable interlock coils. Post embolization arteriography confirms complete occlusion of this vessel with no further contrast extravasation. No collateral filling. The microcatheter was brought back into the left colic artery and advanced into the descending division of the left colic artery to further evaluate the other potential site of bleeding. Arteriography was performed. No evidence of bleeding in the proximal descending colon. The microcatheter was readvanced into the ascending branch and out into the distal visceral branch that appeared to be the site of bleeding on the initial angiogram. Arteriography was performed and carried out to the venous phase. Again, there is no evidence of bleeding at this site. No definite vessel abnormality was identified. No embolization was performed at this site. The microcatheter and rim catheter were removed. A C2 cobra catheter was advanced over a Bentson wire into the abdominal aorta and used to select the celiac  artery. A celiac arteriogram was performed. Normal arterial anatomy. No evidence of replaced middle colic artery. No active bleeding. The superior mesenteric artery was then catheterized. Arteriography was performed. No evidence of back bleeding from the middle colic artery where it anastomosis with the left colic artery. No additional sites of bleeding were identified. The catheter was removed. A limited right common femoral arteriogram was performed. Hemostasis was attained  with the assistance of an Angio-Seal device. IMPRESSION: 1. Positive angiography with potentially 2 sites of active bleeding in the region of the splenic flexure and proximal descending colon. 2. Successful coil embolization of the more definitive site of bleeding. 3. The second potential site of bleeding could not be identified on subsequent angiography and may have been artifactual. PLAN: 1. Patient is at risk for contrast induced nephropathy given baseline stage 3 chronic kidney disease. Recommend trending BUN and creatinine over the next 48 hours. Keep patient well hydrated. 2. Continue to trend H and H and transfuse as needed. 3. If there is evidence of recurrent or persistent active lower GI bleeding, repeat angiography should be considered. Signed, Sterling Big, MD [facility] Radiology Electronically Signed   By: Malachy Moan M.D.   On: 03/07/2017 09:37   Ir Embo [facility] Hemorr Lymph [place] [place]  Inc Guide Roadmapping  Result Date: 03/07/2017 INDICATION: 84 year old female with acute lower GI bleed with the site of bleeding localized to the region of the splenic flexure on tagged RBC scan. She presents for visceral angiography and possible embolization. EXAM: IR EMBO ARTERIAL NOT HEMORR HEMANG INC GUIDE ROADMAPPING; SELECTIVE VISCERAL ARTERIOGRAPHY; ADDITIONAL ARTERIOGRAPHY; IR ULTRASOUND GUIDANCE VASC ACCESS RIGHT 1. Ultrasound-guided vascular access, right common femoral  artery 2. Celiac artery catheterization and angiogram (first order) 3. Superior mesenteric artery catheterization and angiogram (first order) 4. Inferior mesenteric artery catheterization and angiogram with additional sub selective arteriography as follows 5. Left colic artery catheterization and angiogram (second order) 6. Ascending branch of the left colic artery catheterization and angiogram (third order) 7. Marginal artery catheterization and angiogram (third order, additional after basic) 8. Coil embolization of marginal artery 9. Catheterization of the descending branch of the left colic artery with angiogram (third order, additional after basic) 10. Catheterization of a distal branch of the ascending branch of the left colic artery with angiogram (third order, additional after basic) MEDICATIONS: None. ANESTHESIA/SEDATION: Moderate (conscious) sedation was employed during this procedure. A total of Versed 1.5 mg and Fentanyl 50 mcg was administered intravenously. Moderate Sedation Time: 44 minutes. The patient's level of consciousness and vital signs were monitored continuously by radiology nursing throughout the procedure under my direct supervision. CONTRAST:  Approximately 75 mL Isovue 300 FLUOROSCOPY TIME:  Fluoroscopy Time: 13 minutes 24 seconds (907 mGy). COMPLICATIONS: None immediate. PROCEDURE: Informed consent was obtained from the patient following explanation of the procedure, risks, benefits and alternatives. The patient understands, agrees and consents for the procedure. All questions were addressed. A time out was performed prior to the initiation of the procedure. Maximal barrier sterile technique utilized including caps, mask, sterile gowns, sterile gloves, large sterile drape, hand hygiene, and Betadine prep. The right common femoral artery was interrogated with ultrasound and found to be widely patent. An image was obtained and stored for the medical record. Local anesthesia was attained by  infiltration with 1% lidocaine. A small dermatotomy was made. Under real-time sonographic guidance, the vessel was punctured with a 21 gauge micropuncture needle. Using standard technique, the initial micro needle was exchanged over a 0.018 micro wire for a transitional 4 [place] micro sheath. The micro sheath was then exchanged over a 0.035 wire for a 5 French vascular sheath. A Bentson wire was advanced in the abdominal aorta. A rim catheter was advanced over the wire. The rim catheter was used to select the inferior mesenteric artery. An arteriogram was performed. There is early nonvascular extravasation of contrast material  arising from what appears to be the marginal artery at the splenic flexure. This corresponds with the site of bleeding seen on the recent tagged red blood cell study. Interestingly, there is a second site of apparent extravasation arising from a distal visceral branch of the ascending and division of the left colic artery in the proximal descending colon. This extravasation is less robust than the region of extravasation in the marginal artery. A renegade ST microcatheter was then advanced over a Fathom 16 wire in used to select the left colic artery. A left colic arteriogram was performed. The anatomy of the ascending and descending branches was successfully identified. There is persistent contrast extravasation from the marginal artery. No definite contrast extravasation noted on this second angiogram in the region of the descending colon. The catheter was further advanced into the ascending branch of the left colic artery. Arteriography was performed confirming continued abnormal extravasation of contrast from the marginal artery. This has an ovoid appearance and may represent pooling within a dependent diverticulum versus fusiform aneurysmal dilatation. The microcatheter was successfully advanced into the marginal artery. Contrast demonstrates frank extravasation. Coil embolization was  performed using a series of 2 and 3 mm fibered detachable interlock coils. Post embolization arteriography confirms complete occlusion of this vessel with no further contrast extravasation. No collateral filling. The microcatheter was brought back into the left colic artery and advanced into the descending division of the left colic artery to further evaluate the other potential site of bleeding. Arteriography was performed. No evidence of bleeding in the proximal descending colon. The microcatheter was readvanced into the ascending branch and out into the distal visceral branch that appeared to be the site of bleeding on the initial angiogram. Arteriography was performed and carried out to the venous phase. Again, there is no evidence of bleeding at this site. No definite vessel abnormality was identified. No embolization was performed at this site. The microcatheter and rim catheter were removed. A C2 cobra catheter was advanced over a Bentson wire into the abdominal aorta and used to select the celiac artery. A celiac arteriogram was performed. Normal arterial anatomy. No evidence of replaced middle colic artery. No active bleeding. The superior mesenteric artery was then catheterized. Arteriography was performed. No evidence of back bleeding from the middle colic artery where it anastomosis with the left colic artery. No additional sites of bleeding were identified. The catheter was removed. A limited right common femoral arteriogram was performed. Hemostasis was attained with the assistance of an Angio-Seal device. IMPRESSION: 1. Positive angiography with potentially 2 sites of active bleeding in the region of the splenic flexure and proximal descending colon. 2. Successful coil embolization of the more definitive site of bleeding. 3. The second potential site of bleeding could not be identified on subsequent angiography and may have been artifactual. PLAN: 1. Patient is at risk for contrast induced nephropathy  given baseline stage 3 chronic kidney disease. Recommend trending BUN and creatinine over the next 48 hours. Keep patient well hydrated. 2. Continue to trend H and H and transfuse as needed. 3. If there is evidence of recurrent or persistent active lower GI bleeding, repeat angiography should be considered. Signed, Sterling Big, MD [facility] Radiology Electronically Signed   By: Malachy Moan M.D.   On: 03/07/2017 09:37    Labs:  CBC: Recent Labs    11/27/16 2132 03/06/17 0839 03/07/17 0310 03/07/17 0745 03/07/17 1911 03/08/17 0236  WBC 4.4 4.5 8.2 8.2  --   --  HGB 10.2* 8.7* 8.6* 8.1* 7.8* 7.6*  HCT 31.4* 26.5* 25.7* 24.4* 23.1* 22.1*  PLT 262 243 147* 143*  --   --     COAGS: No results for input(s): INR, APTT in the last 8760 hours.  BMP: Recent Labs    11/27/16 2132 03/06/17 0839 03/07/17 0745 03/08/17 0236  NA 137 140 141 141  K 4.1 4.0 4.1 3.8  CL 104 106 111 111  CO2 26 24 22 23   GLUCOSE 98 117* 101* 98  BUN 34* 27* 20 17  CALCIUM 9.0 8.9 8.1* 7.8*  CREATININE 1.58* 1.41* 1.22* 1.28*  GFRNONAA 27* 32* 38* 35*  GFRAA 32* 37* 44* 41*    LIVER FUNCTION TESTS: Recent Labs    09/28/16 1128 03/06/17 0839 03/07/17 0745  BILITOT 0.6 0.4 0.6  AST 21 19 17   ALT 10* 9* 7*  ALKPHOS 53 43 36*  PROT 6.7 5.6* 4.6*  ALBUMIN 3.7 3.0* 2.5*    TUMOR MARKERS: No results for input(s): AFPTM, CEA, CA199, CHROMGRNA in the last 8760 hours.  Assessment and Plan:  Lower GI Bleed Requiring embolization in IR 1/23  Re bleed this am---poss from previously noted additional site of bleeding  Now for mesenteric arteriogram with possible additional embolization  Risks and benefits of mesenteric arteriogram with possible embolization  were discussed with the patient including, but not limited to bleeding, infection, vascular injury or contrast induced renal failure. This interventional procedure involves the use  of X-rays and because of the nature of the planned procedure, it is possible that we will have prolonged use of X-ray fluoroscopy. Potential radiation risks to you include (but are not limited to) the following: - A slightly elevated risk for cancer  several years later in life. This risk is typically less than 0.5% percent. This risk is low in comparison to the normal incidence of human cancer, which is 33% for women and 50% for men according to the [employer]. - Radiation induced injury can include skin redness, resembling a rash, tissue breakdown / ulcers and hair loss (which can be temporary or permanent).  The likelihood of either of these occurring depends on the difficulty of the procedure and whether you are sensitive to radiation due to previous procedures, disease, or genetic conditions.  IF your procedure requires a prolonged use of radiation, you will be notified and given written instructions for further action.  It is your responsibility to monitor the irradiated area for the 2 weeks following the procedure and to notify your physician if you are concerned that you have suffered a radiation induced injury.    All of the patient's questions were answered, patient is agreeable to proceed. Consent signed and in chart.  Thank you for this interesting consult.  I greatly enjoyed meeting [name] and look forward to participating in their care.  A copy of this report was sent to the requesting provider on this date.  Electronically Signed: Robet LeuURPIN,Kasondra Junod A, PA-C 03/08/2017, 1:11 PM   I spent a total of 20 Minutes    in face to face in clinical consultation, greater than 50% of which was counseling/coordinating care for mesenteric arteriogram with poss embolization

## 2017-03-08 NOTE — Progress Notes (Signed)
Subjective: Recurrent bleeding. No abdominal pain.  Objective: Vital signs in last 24 hours: Temp:  [97.6 F (36.4 C)-98.2 F (36.8 C)] 97.8 F (36.6 C) (01/25 1215) Pulse Rate:  [64-73] 73 (01/25 1215) Resp:  [11-21] 21 (01/25 1215) BP: (91-149)/(46-82) 149/69 (01/25 1215) SpO2:  [97 %] 97 % (01/25 1215) Weight:  [203 lb 3.2 oz (92.2 kg)] 203 lb 3.2 oz (92.2 kg) (01/25 0546) Weight change: 4 lb 9.1 oz (2.071 kg) Last BM Date: 03/07/17  PE: GEN:  NAD, younger-appearing than stated age. ABD:  Non-tender.  Lab Results:  Assessment:  1.  Recurrent hematochezia.  Suspected diverticulosis.  IR with embolization 1/23.  Unfortunately bleeding resumed. 2.  Acute blood loss anemia.  Plan:  1.  Repeat IR angiogram with possible embolization planned for today. 2.  [facility] will follow.   Freddy JakschOUTLAW,Yuriana Gaal M 03/08/2017, 1:23 PM   Cell [phone] If no answer or after 5 PM call [phone]

## 2017-03-08 NOTE — Progress Notes (Addendum)
Pt had a medium amount/size bowel with frank blood and some clots. She states she is a little dizzy with position changes, but otherwise VS are stable and she is without complaint.  Dr Jarvis NewcomerGrunz is aware and new orders are placed. Will continue to monitor for changes.    1215: Pt has continued to have stools with frank blood. She is now complaining of SOB with position changes. Otherwise VS are still stable. Dr. Jarvis NewcomerGrunz is aware and new orders placed. Will continue to monitor for changes.  13:00: IR at bedside. Will be going down shortly for procedure. IR aware pt has blood running, will transfer her to IR with this set up.

## 2017-03-08 NOTE — Progress Notes (Signed)
PROGRESS NOTE  [name]  WUJ:[id number] DOB: 08/18/1923 DOA: 03/06/2017 PCP: Fleet Contras, MD   Brief Narrative: [name] is a 84 y.o. female with a history of diverticulosis with recurrent GI bleeding, aortic stenosis, HTN on ASA who presented for lower abdominal cramping and blood in stool. Hgb noted to be 8.7 (previously ~10), 2u PRBCs provided and bleeding scan was performed showing bleed at splenic flexure. Subsequent angiogram with coil embolization of actively bleeding branch of the left colic artery was performed in IR. Hgb trended downward with improvement but not resolution of bloody stools. 1u PRBCs given 1/25 for symptomatic anemia.   Assessment & Plan: Principal Problem:   Acute lower GI bleeding Active Problems:   Diverticula, colon   HTN (hypertension)   High cholesterol   Acute kidney injury (HCC)   Acute blood loss anemia  Diverticulosis with acute lower GI bleeding and acute blood loss anemia on chronic iron-deficiency anemia: Severe diverticulosis previously limited colonoscopies. - Underwent coil embolization of left colic artery 1/23.  - Clear liquid diet, or per GI/IR.  - CBC after transfusion and in AM - GI and IR following  Thrombocytopenia: Mild.  - Holding ASA, will continue to monitor  AKI on stage III CKD: Cr improved with fluids, transfusions to baseline ~1.2.  - Monitor SCr particularly for CIN 1/26.   HTN:  - Holding metoprolol, losartan/HCTZ  Hypercholesterolemia: Intolerant to multiple medications.   DVT prophylaxis: SCDs Code Status: Full Family Communication: None at bedside Disposition Plan: Continue close monitoring in SDU.   Consultants:   GI  IR  Procedures:  Visceral angiogram with coil embolization of bleeding branch of the left colic artery 03/06/2016.   Antimicrobials:  None  Subjective: Had maroon stool this AM with mild abd cramping during BM which has resolved. Lightheaded with standing. No CP, SOB.    Objective: Vitals:   03/08/17 0546 03/08/17 0852 03/08/17 0854 03/08/17 1153  BP:  (!) 147/74 128/82 (!) 127/56  Pulse:    64  Resp:  (!) 21 15   Temp:   98.1 F (36.7 C) 98 F (36.7 C)  TempSrc:   Oral Oral  SpO2:    97%  Weight: 92.2 kg (203 lb 3.2 oz)     Height:        Intake/Output Summary (Last 24 hours) at 03/08/2017 1211 Last data filed at 03/08/2017 1200 Gross per 24 hour  Intake 1001.75 ml  Output -  Net 1001.75 ml   Filed Weights   03/06/17 1414 03/07/17 0314 03/08/17 0546  Weight: 90.1 kg (198 lb 10.2 oz) 91 kg (200 lb 9.9 oz) 92.2 kg (203 lb 3.2 oz)    Gen: Pleasant, elderly female in no distress Pulm: Non-labored breathing room air. Clear to auscultation bilaterally.  CV: Regular rate and rhythm. No murmur, rub, or gallop. No JVD, trace pedal edema. GI: Abdomen soft, no tenderness, non-distended, with normoactive bowel sounds. No organomegaly or masses felt. Ext: Warm, no deformities Skin: No rashes, lesions no ulcers Neuro: Alert and oriented. No focal neurological deficits. Psych: Judgement and insight appear normal. Mood & affect appropriate.   Data Reviewed: I have personally reviewed following labs and imaging studies  CBC: Recent Labs  Lab 03/06/17 0839 03/07/17 0310 03/07/17 0745 03/07/17 1911 03/08/17 0236  WBC 4.5 8.2 8.2  --   --   NEUTROABS 3.0  --   --   --   --   HGB 8.7* 8.6* 8.1* 7.8* 7.6*  HCT  26.5* 25.7* 24.4* 23.1* 22.1*  MCV 92.7 91.1 89.4  --   --   PLT 243 147* 143*  --   --    Basic Metabolic Panel: Recent Labs  Lab 03/06/17 0839 03/07/17 0745 03/08/17 0236  NA 140 141 141  K 4.0 4.1 3.8  CL 106 111 111  CO2 24 22 23   GLUCOSE 117* 101* 98  BUN 27* 20 17  CREATININE 1.41* 1.22* 1.28*  CALCIUM 8.9 8.1* 7.8*   GFR: Estimated Creatinine Clearance: 30.9 mL/min (A) (by C-G formula based on SCr of 1.28 mg/dL (H)). Liver Function Tests: Recent Labs  Lab 03/06/17 0839 03/07/17 0745  AST 19 17  ALT 9* 7*  ALKPHOS  43 36*  BILITOT 0.4 0.6  PROT 5.6* 4.6*  ALBUMIN 3.0* 2.5*   No results for input(s): LIPASE, AMYLASE in the last 168 hours. No results for input(s): AMMONIA in the last 168 hours. Coagulation Profile: No results for input(s): INR, PROTIME in the last 168 hours. Cardiac Enzymes: No results for input(s): CKTOTAL, CKMB, CKMBINDEX, TROPONINI in the last 168 hours. BNP (last 3 results) No results for input(s): PROBNP in the last 8760 hours. HbA1C: No results for input(s): HGBA1C in the last 72 hours. CBG: No results for input(s): GLUCAP in the last 168 hours. Lipid Profile: No results for input(s): CHOL, HDL, LDLCALC, TRIG, CHOLHDL, LDLDIRECT in the last 72 hours. Thyroid Function Tests: No results for input(s): TSH, T4TOTAL, FREET4, T3FREE, THYROIDAB in the last 72 hours. Anemia Panel: No results for input(s): VITAMINB12, FOLATE, FERRITIN, TIBC, IRON, RETICCTPCT in the last 72 hours. Urine analysis:    Component Value Date/Time   COLORURINE YELLOW 09/08/2014 1319   APPEARANCEUR CLEAR 09/08/2014 1319   LABSPEC 1.018 09/08/2014 1319   PHURINE 7.0 09/08/2014 1319   GLUCOSEU NEGATIVE 09/08/2014 1319   HGBUR TRACE (A) 09/08/2014 1319   BILIRUBINUR NEGATIVE 09/08/2014 1319   KETONESUR NEGATIVE 09/08/2014 1319   PROTEINUR NEGATIVE 09/08/2014 1319   UROBILINOGEN 1.0 09/08/2014 1319   NITRITE NEGATIVE 09/08/2014 1319   LEUKOCYTESUR NEGATIVE 09/08/2014 1319   Recent Results (from the past 240 hour(s))  MRSA PCR Screening     Status: None   Collection Time: 03/06/17  2:14 PM  Result Value Ref Range Status   MRSA by PCR NEGATIVE NEGATIVE Final    Comment:        The GeneXpert MRSA Assay (FDA approved for NASAL specimens only), is one component of a comprehensive MRSA colonization surveillance program. It is not intended to diagnose MRSA infection nor to guide or monitor treatment for MRSA infections.       Radiology Studies: Nm Gi Blood Loss  Result Date:  03/06/2017 CLINICAL DATA:  84 year old female with GI bleeding, anemia. EXAM: NUCLEAR MEDICINE GASTROINTESTINAL BLEEDING SCAN TECHNIQUE: Sequential abdominal images were obtained following intravenous administration of Tc-4136m labeled red blood cells. RADIOPHARMACEUTICALS:  25.4 mCi Tc-6736m pertechnetate in-vitro labeled red cells. COMPARISON:  CT Abdomen and Pelvis 03/08/2015 FINDINGS: Physiologic blood pool activity is present, but almost immediately there is abnormal radiotracer activity within bowel loops of the left abdomen. This appears to originate at the splenic flexure, and then transit is in both the distal transverse colon and the descending colon to the sigmoid region. Widespread diverticulosis in the large bowel on the comparison CT Abdomen and Pelvis. IMPRESSION: Positive for active GI bleeding. Bleeding origin appears to be the splenic flexure of colon. Critical Value/emergent results were called by telephone at the time of interpretation on 03/06/2017  at 1347 hr to Dr. Luberta Robertson in the ED, who verbally acknowledged these results. Electronically Signed   By: Odessa Fleming M.D.   On: 03/06/2017 14:04   Ir Angiogram Visceral Selective  Result Date: 03/07/2017 INDICATION: 84 year old female with acute lower GI bleed with the site of bleeding localized to the region of the splenic flexure on tagged RBC scan. She presents for visceral angiography and possible embolization. EXAM: IR EMBO ARTERIAL NOT HEMORR HEMANG INC GUIDE ROADMAPPING; SELECTIVE VISCERAL ARTERIOGRAPHY; ADDITIONAL ARTERIOGRAPHY; IR ULTRASOUND GUIDANCE VASC ACCESS RIGHT 1. Ultrasound-guided vascular access, right common femoral artery 2. Celiac artery catheterization and angiogram (first order) 3. Superior mesenteric artery catheterization and angiogram (first order) 4. Inferior mesenteric artery catheterization and angiogram with additional sub selective arteriography as follows 5. Left colic artery catheterization and angiogram (second order)  6. Ascending branch of the left colic artery catheterization and angiogram (third order) 7. Marginal artery catheterization and angiogram (third order, additional after basic) 8. Coil embolization of marginal artery 9. Catheterization of the descending branch of the left colic artery with angiogram (third order, additional after basic) 10. Catheterization of a distal branch of the ascending branch of the left colic artery with angiogram (third order, additional after basic) MEDICATIONS: None. ANESTHESIA/SEDATION: Moderate (conscious) sedation was employed during this procedure. A total of Versed 1.5 mg and Fentanyl 50 mcg was administered intravenously. Moderate Sedation Time: 44 minutes. The patient's level of consciousness and vital signs were monitored continuously by radiology nursing throughout the procedure under my direct supervision. CONTRAST:  Approximately 75 mL Isovue 300 FLUOROSCOPY TIME:  Fluoroscopy Time: 13 minutes 24 seconds (907 mGy). COMPLICATIONS: None immediate. PROCEDURE: Informed consent was obtained from the patient following explanation of the procedure, risks, benefits and alternatives. The patient understands, agrees and consents for the procedure. All questions were addressed. A time out was performed prior to the initiation of the procedure. Maximal barrier sterile technique utilized including caps, mask, sterile gowns, sterile gloves, large sterile drape, hand hygiene, and Betadine prep. The right common femoral artery was interrogated with ultrasound and found to be widely patent. An image was obtained and stored for the medical record. Local anesthesia was attained by infiltration with 1% lidocaine. A small dermatotomy was made. Under real-time sonographic guidance, the vessel was punctured with a 21 gauge micropuncture needle. Using standard technique, the initial micro needle was exchanged over a 0.018 micro wire for a transitional 4 [place] micro sheath. The micro sheath was then  exchanged over a 0.035 wire for a 5 French vascular sheath. A Bentson wire was advanced in the abdominal aorta. A rim catheter was advanced over the wire. The rim catheter was used to select the inferior mesenteric artery. An arteriogram was performed. There is early nonvascular extravasation of contrast material arising from what appears to be the marginal artery at the splenic flexure. This corresponds with the site of bleeding seen on the recent tagged red blood cell study. Interestingly, there is a second site of apparent extravasation arising from a distal visceral branch of the ascending and division of the left colic artery in the proximal descending colon. This extravasation is less robust than the region of extravasation in the marginal artery. A renegade ST microcatheter was then advanced over a Fathom 16 wire in used to select the left colic artery. A left colic arteriogram was performed. The anatomy of the ascending and descending branches was successfully identified. There is persistent contrast extravasation from the marginal artery. No definite contrast extravasation noted on  this second angiogram in the region of the descending colon. The catheter was further advanced into the ascending branch of the left colic artery. Arteriography was performed confirming continued abnormal extravasation of contrast from the marginal artery. This has an ovoid appearance and may represent pooling within a dependent diverticulum versus fusiform aneurysmal dilatation. The microcatheter was successfully advanced into the marginal artery. Contrast demonstrates frank extravasation. Coil embolization was performed using a series of 2 and 3 mm fibered detachable interlock coils. Post embolization arteriography confirms complete occlusion of this vessel with no further contrast extravasation. No collateral filling. The microcatheter was brought back into the left colic artery and advanced into the descending division of the  left colic artery to further evaluate the other potential site of bleeding. Arteriography was performed. No evidence of bleeding in the proximal descending colon. The microcatheter was readvanced into the ascending branch and out into the distal visceral branch that appeared to be the site of bleeding on the initial angiogram. Arteriography was performed and carried out to the venous phase. Again, there is no evidence of bleeding at this site. No definite vessel abnormality was identified. No embolization was performed at this site. The microcatheter and rim catheter were removed. A C2 cobra catheter was advanced over a Bentson wire into the abdominal aorta and used to select the celiac artery. A celiac arteriogram was performed. Normal arterial anatomy. No evidence of replaced middle colic artery. No active bleeding. The superior mesenteric artery was then catheterized. Arteriography was performed. No evidence of back bleeding from the middle colic artery where it anastomosis with the left colic artery. No additional sites of bleeding were identified. The catheter was removed. A limited right common femoral arteriogram was performed. Hemostasis was attained with the assistance of an Angio-Seal device. IMPRESSION: 1. Positive angiography with potentially 2 sites of active bleeding in the region of the splenic flexure and proximal descending colon. 2. Successful coil embolization of the more definitive site of bleeding. 3. The second potential site of bleeding could not be identified on subsequent angiography and may have been artifactual. PLAN: 1. Patient is at risk for contrast induced nephropathy given baseline stage 3 chronic kidney disease. Recommend trending BUN and creatinine over the next 48 hours. Keep patient well hydrated. 2. Continue to trend H and H and transfuse as needed. 3. If there is evidence of recurrent or persistent active lower GI bleeding, repeat angiography should be considered. Signed, Sterling Big, MD [facility] Electronically Signed   By: Malachy Moan M.D.   On: 03/07/2017 09:37   Ir Angiogram Visceral Selective  Result Date: 03/07/2017 INDICATION: 84 year old female with acute lower GI bleed with the site of bleeding localized to the region of the splenic flexure on tagged RBC scan. She presents for visceral angiography and possible embolization. EXAM: IR EMBO ARTERIAL NOT HEMORR HEMANG INC GUIDE ROADMAPPING; SELECTIVE VISCERAL ARTERIOGRAPHY; ADDITIONAL ARTERIOGRAPHY; IR ULTRASOUND GUIDANCE VASC ACCESS RIGHT 1. Ultrasound-guided vascular access, right common femoral artery 2. Celiac artery catheterization and angiogram (first order) 3. Superior mesenteric artery catheterization and angiogram (first order) 4. Inferior mesenteric artery catheterization and angiogram with additional sub selective arteriography as follows 5. Left colic artery catheterization and angiogram (second order) 6. Ascending branch of the left colic artery catheterization and angiogram (third order) 7. Marginal artery catheterization and angiogram (third order, additional after basic) 8. Coil embolization of marginal artery 9. Catheterization of the descending branch of the left colic artery with angiogram (  third order, additional after basic) 10. Catheterization of a distal branch of the ascending branch of the left colic artery with angiogram (third order, additional after basic) MEDICATIONS: None. ANESTHESIA/SEDATION: Moderate (conscious) sedation was employed during this procedure. A total of Versed 1.5 mg and Fentanyl 50 mcg was administered intravenously. Moderate Sedation Time: 44 minutes. The patient's level of consciousness and vital signs were monitored continuously by radiology nursing throughout the procedure under my direct supervision. CONTRAST:  Approximately 75 mL Isovue 300 FLUOROSCOPY TIME:  Fluoroscopy Time: 13 minutes 24 seconds (907  mGy). COMPLICATIONS: None immediate. PROCEDURE: Informed consent was obtained from the patient following explanation of the procedure, risks, benefits and alternatives. The patient understands, agrees and consents for the procedure. All questions were addressed. A time out was performed prior to the initiation of the procedure. Maximal barrier sterile technique utilized including caps, mask, sterile gowns, sterile gloves, large sterile drape, hand hygiene, and Betadine prep. The right common femoral artery was interrogated with ultrasound and found to be widely patent. An image was obtained and stored for the medical record. Local anesthesia was attained by infiltration with 1% lidocaine. A small dermatotomy was made. Under real-time sonographic guidance, the vessel was punctured with a 21 gauge micropuncture needle. Using standard technique, the initial micro needle was exchanged over a 0.018 micro wire for a transitional 4 [place] micro sheath. The micro sheath was then exchanged over a 0.035 wire for a 5 French vascular sheath. A Bentson wire was advanced in the abdominal aorta. A rim catheter was advanced over the wire. The rim catheter was used to select the inferior mesenteric artery. An arteriogram was performed. There is early nonvascular extravasation of contrast material arising from what appears to be the marginal artery at the splenic flexure. This corresponds with the site of bleeding seen on the recent tagged red blood cell study. Interestingly, there is a second site of apparent extravasation arising from a distal visceral branch of the ascending and division of the left colic artery in the proximal descending colon. This extravasation is less robust than the region of extravasation in the marginal artery. A renegade ST microcatheter was then advanced over a Fathom 16 wire in used to select the left colic artery. A left colic arteriogram was performed. The anatomy of the ascending and descending  branches was successfully identified. There is persistent contrast extravasation from the marginal artery. No definite contrast extravasation noted on this second angiogram in the region of the descending colon. The catheter was further advanced into the ascending branch of the left colic artery. Arteriography was performed confirming continued abnormal extravasation of contrast from the marginal artery. This has an ovoid appearance and may represent pooling within a dependent diverticulum versus fusiform aneurysmal dilatation. The microcatheter was successfully advanced into the marginal artery. Contrast demonstrates frank extravasation. Coil embolization was performed using a series of 2 and 3 mm fibered detachable interlock coils. Post embolization arteriography confirms complete occlusion of this vessel with no further contrast extravasation. No collateral filling. The microcatheter was brought back into the left colic artery and advanced into the descending division of the left colic artery to further evaluate the other potential site of bleeding. Arteriography was performed. No evidence of bleeding in the proximal descending colon. The microcatheter was readvanced into the ascending branch and out into the distal visceral branch that appeared to be the site of bleeding on the initial angiogram. Arteriography was performed and carried out to the venous phase. Again, there is no  evidence of bleeding at this site. No definite vessel abnormality was identified. No embolization was performed at this site. The microcatheter and rim catheter were removed. A C2 cobra catheter was advanced over a Bentson wire into the abdominal aorta and used to select the celiac artery. A celiac arteriogram was performed. Normal arterial anatomy. No evidence of replaced middle colic artery. No active bleeding. The superior mesenteric artery was then catheterized. Arteriography was performed. No evidence of back bleeding from the  middle colic artery where it anastomosis with the left colic artery. No additional sites of bleeding were identified. The catheter was removed. A limited right common femoral arteriogram was performed. Hemostasis was attained with the assistance of an Angio-Seal device. IMPRESSION: 1. Positive angiography with potentially 2 sites of active bleeding in the region of the splenic flexure and proximal descending colon. 2. Successful coil embolization of the more definitive site of bleeding. 3. The second potential site of bleeding could not be identified on subsequent angiography and may have been artifactual. PLAN: 1. Patient is at risk for contrast induced nephropathy given baseline stage 3 chronic kidney disease. Recommend trending BUN and creatinine over the next 48 hours. Keep patient well hydrated. 2. Continue to trend H and H and transfuse as needed. 3. If there is evidence of recurrent or persistent active lower GI bleeding, repeat angiography should be considered. Signed, Sterling Big, MD [facility] Electronically Signed   By: Malachy Moan M.D.   On: 03/07/2017 09:37   Ir Angiogram Visceral Selective  Result Date: 03/07/2017 INDICATION: 84 year old female with acute lower GI bleed with the site of bleeding localized to the region of the splenic flexure on tagged RBC scan. She presents for visceral angiography and possible embolization. EXAM: IR EMBO ARTERIAL NOT HEMORR HEMANG INC GUIDE ROADMAPPING; SELECTIVE VISCERAL ARTERIOGRAPHY; ADDITIONAL ARTERIOGRAPHY; IR ULTRASOUND GUIDANCE VASC ACCESS RIGHT 1. Ultrasound-guided vascular access, right common femoral artery 2. Celiac artery catheterization and angiogram (first order) 3. Superior mesenteric artery catheterization and angiogram (first order) 4. Inferior mesenteric artery catheterization and angiogram with additional sub selective arteriography as follows 5. Left colic artery  catheterization and angiogram (second order) 6. Ascending branch of the left colic artery catheterization and angiogram (third order) 7. Marginal artery catheterization and angiogram (third order, additional after basic) 8. Coil embolization of marginal artery 9. Catheterization of the descending branch of the left colic artery with angiogram (third order, additional after basic) 10. Catheterization of a distal branch of the ascending branch of the left colic artery with angiogram (third order, additional after basic) MEDICATIONS: None. ANESTHESIA/SEDATION: Moderate (conscious) sedation was employed during this procedure. A total of Versed 1.5 mg and Fentanyl 50 mcg was administered intravenously. Moderate Sedation Time: 44 minutes. The patient's level of consciousness and vital signs were monitored continuously by radiology nursing throughout the procedure under my direct supervision. CONTRAST:  Approximately 75 mL Isovue 300 FLUOROSCOPY TIME:  Fluoroscopy Time: 13 minutes 24 seconds (907 mGy). COMPLICATIONS: None immediate. PROCEDURE: Informed consent was obtained from the patient following explanation of the procedure, risks, benefits and alternatives. The patient understands, agrees and consents for the procedure. All questions were addressed. A time out was performed prior to the initiation of the procedure. Maximal barrier sterile technique utilized including caps, mask, sterile gowns, sterile gloves, large sterile drape, hand hygiene, and Betadine prep. The right common femoral artery was interrogated with ultrasound and found to be widely patent. An image was obtained and stored for the  medical record. Local anesthesia was attained by infiltration with 1% lidocaine. A small dermatotomy was made. Under real-time sonographic guidance, the vessel was punctured with a 21 gauge micropuncture needle. Using standard technique, the initial micro needle was exchanged over a 0.018 micro wire for a transitional 4  [place] micro sheath. The micro sheath was then exchanged over a 0.035 wire for a 5 French vascular sheath. A Bentson wire was advanced in the abdominal aorta. A rim catheter was advanced over the wire. The rim catheter was used to select the inferior mesenteric artery. An arteriogram was performed. There is early nonvascular extravasation of contrast material arising from what appears to be the marginal artery at the splenic flexure. This corresponds with the site of bleeding seen on the recent tagged red blood cell study. Interestingly, there is a second site of apparent extravasation arising from a distal visceral branch of the ascending and division of the left colic artery in the proximal descending colon. This extravasation is less robust than the region of extravasation in the marginal artery. A renegade ST microcatheter was then advanced over a Fathom 16 wire in used to select the left colic artery. A left colic arteriogram was performed. The anatomy of the ascending and descending branches was successfully identified. There is persistent contrast extravasation from the marginal artery. No definite contrast extravasation noted on this second angiogram in the region of the descending colon. The catheter was further advanced into the ascending branch of the left colic artery. Arteriography was performed confirming continued abnormal extravasation of contrast from the marginal artery. This has an ovoid appearance and may represent pooling within a dependent diverticulum versus fusiform aneurysmal dilatation. The microcatheter was successfully advanced into the marginal artery. Contrast demonstrates frank extravasation. Coil embolization was performed using a series of 2 and 3 mm fibered detachable interlock coils. Post embolization arteriography confirms complete occlusion of this vessel with no further contrast extravasation. No collateral filling. The microcatheter was brought back into the left colic artery  and advanced into the descending division of the left colic artery to further evaluate the other potential site of bleeding. Arteriography was performed. No evidence of bleeding in the proximal descending colon. The microcatheter was readvanced into the ascending branch and out into the distal visceral branch that appeared to be the site of bleeding on the initial angiogram. Arteriography was performed and carried out to the venous phase. Again, there is no evidence of bleeding at this site. No definite vessel abnormality was identified. No embolization was performed at this site. The microcatheter and rim catheter were removed. A C2 cobra catheter was advanced over a Bentson wire into the abdominal aorta and used to select the celiac artery. A celiac arteriogram was performed. Normal arterial anatomy. No evidence of replaced middle colic artery. No active bleeding. The superior mesenteric artery was then catheterized. Arteriography was performed. No evidence of back bleeding from the middle colic artery where it anastomosis with the left colic artery. No additional sites of bleeding were identified. The catheter was removed. A limited right common femoral arteriogram was performed. Hemostasis was attained with the assistance of an Angio-Seal device. IMPRESSION: 1. Positive angiography with potentially 2 sites of active bleeding in the region of the splenic flexure and proximal descending colon. 2. Successful coil embolization of the more definitive site of bleeding. 3. The second potential site of bleeding could not be identified on subsequent angiography and may have been artifactual. PLAN: 1. Patient is at risk for contrast induced  nephropathy given baseline stage 3 chronic kidney disease. Recommend trending BUN and creatinine over the next 48 hours. Keep patient well hydrated. 2. Continue to trend H and H and transfuse as needed. 3. If there is evidence of recurrent or persistent active lower GI bleeding, repeat  angiography should be considered. Signed, Sterling Big, MD [facility] Radiology Electronically Signed   By: Malachy Moan M.D.   On: 03/07/2017 09:37   Ir Angiogram Selective Each Additional Vessel  Result Date: 03/07/2017 INDICATION: 84 year old female with acute lower GI bleed with the site of bleeding localized to the region of the splenic flexure on tagged RBC scan. She presents for visceral angiography and possible embolization. EXAM: IR EMBO ARTERIAL NOT HEMORR HEMANG INC GUIDE ROADMAPPING; SELECTIVE VISCERAL ARTERIOGRAPHY; ADDITIONAL ARTERIOGRAPHY; IR ULTRASOUND GUIDANCE VASC ACCESS RIGHT 1. Ultrasound-guided vascular access, right common femoral artery 2. Celiac artery catheterization and angiogram (first order) 3. Superior mesenteric artery catheterization and angiogram (first order) 4. Inferior mesenteric artery catheterization and angiogram with additional sub selective arteriography as follows 5. Left colic artery catheterization and angiogram (second order) 6. Ascending branch of the left colic artery catheterization and angiogram (third order) 7. Marginal artery catheterization and angiogram (third order, additional after basic) 8. Coil embolization of marginal artery 9. Catheterization of the descending branch of the left colic artery with angiogram (third order, additional after basic) 10. Catheterization of a distal branch of the ascending branch of the left colic artery with angiogram (third order, additional after basic) MEDICATIONS: None. ANESTHESIA/SEDATION: Moderate (conscious) sedation was employed during this procedure. A total of Versed 1.5 mg and Fentanyl 50 mcg was administered intravenously. Moderate Sedation Time: 44 minutes. The patient's level of consciousness and vital signs were monitored continuously by radiology nursing throughout the procedure under my direct supervision. CONTRAST:  Approximately 75 mL Isovue 300  FLUOROSCOPY TIME:  Fluoroscopy Time: 13 minutes 24 seconds (907 mGy). COMPLICATIONS: None immediate. PROCEDURE: Informed consent was obtained from the patient following explanation of the procedure, risks, benefits and alternatives. The patient understands, agrees and consents for the procedure. All questions were addressed. A time out was performed prior to the initiation of the procedure. Maximal barrier sterile technique utilized including caps, mask, sterile gowns, sterile gloves, large sterile drape, hand hygiene, and Betadine prep. The right common femoral artery was interrogated with ultrasound and found to be widely patent. An image was obtained and stored for the medical record. Local anesthesia was attained by infiltration with 1% lidocaine. A small dermatotomy was made. Under real-time sonographic guidance, the vessel was punctured with a 21 gauge micropuncture needle. Using standard technique, the initial micro needle was exchanged over a 0.018 micro wire for a transitional 4 [place] micro sheath. The micro sheath was then exchanged over a 0.035 wire for a 5 French vascular sheath. A Bentson wire was advanced in the abdominal aorta. A rim catheter was advanced over the wire. The rim catheter was used to select the inferior mesenteric artery. An arteriogram was performed. There is early nonvascular extravasation of contrast material arising from what appears to be the marginal artery at the splenic flexure. This corresponds with the site of bleeding seen on the recent tagged red blood cell study. Interestingly, there is a second site of apparent extravasation arising from a distal visceral branch of the ascending and division of the left colic artery in the proximal descending colon. This extravasation is less robust than the region of extravasation in the marginal artery. A  renegade ST microcatheter was then advanced over a Fathom 16 wire in used to select the left colic artery. A left colic arteriogram  was performed. The anatomy of the ascending and descending branches was successfully identified. There is persistent contrast extravasation from the marginal artery. No definite contrast extravasation noted on this second angiogram in the region of the descending colon. The catheter was further advanced into the ascending branch of the left colic artery. Arteriography was performed confirming continued abnormal extravasation of contrast from the marginal artery. This has an ovoid appearance and may represent pooling within a dependent diverticulum versus fusiform aneurysmal dilatation. The microcatheter was successfully advanced into the marginal artery. Contrast demonstrates frank extravasation. Coil embolization was performed using a series of 2 and 3 mm fibered detachable interlock coils. Post embolization arteriography confirms complete occlusion of this vessel with no further contrast extravasation. No collateral filling. The microcatheter was brought back into the left colic artery and advanced into the descending division of the left colic artery to further evaluate the other potential site of bleeding. Arteriography was performed. No evidence of bleeding in the proximal descending colon. The microcatheter was readvanced into the ascending branch and out into the distal visceral branch that appeared to be the site of bleeding on the initial angiogram. Arteriography was performed and carried out to the venous phase. Again, there is no evidence of bleeding at this site. No definite vessel abnormality was identified. No embolization was performed at this site. The microcatheter and rim catheter were removed. A C2 cobra catheter was advanced over a Bentson wire into the abdominal aorta and used to select the celiac artery. A celiac arteriogram was performed. Normal arterial anatomy. No evidence of replaced middle colic artery. No active bleeding. The superior mesenteric artery was then catheterized. Arteriography  was performed. No evidence of back bleeding from the middle colic artery where it anastomosis with the left colic artery. No additional sites of bleeding were identified. The catheter was removed. A limited right common femoral arteriogram was performed. Hemostasis was attained with the assistance of an Angio-Seal device. IMPRESSION: 1. Positive angiography with potentially 2 sites of active bleeding in the region of the splenic flexure and proximal descending colon. 2. Successful coil embolization of the more definitive site of bleeding. 3. The second potential site of bleeding could not be identified on subsequent angiography and may have been artifactual. PLAN: 1. Patient is at risk for contrast induced nephropathy given baseline stage 3 chronic kidney disease. Recommend trending BUN and creatinine over the next 48 hours. Keep patient well hydrated. 2. Continue to trend H and H and transfuse as needed. 3. If there is evidence of recurrent or persistent active lower GI bleeding, repeat angiography should be considered. Signed, Sterling Big, MD [facility] Radiology Electronically Signed   By: Malachy Moan M.D.   On: 03/07/2017 09:37   Ir Angiogram Selective Each Additional Vessel  Result Date: 03/07/2017 INDICATION: 84 year old female with acute lower GI bleed with the site of bleeding localized to the region of the splenic flexure on tagged RBC scan. She presents for visceral angiography and possible embolization. EXAM: IR EMBO ARTERIAL NOT HEMORR HEMANG INC GUIDE ROADMAPPING; SELECTIVE VISCERAL ARTERIOGRAPHY; ADDITIONAL ARTERIOGRAPHY; IR ULTRASOUND GUIDANCE VASC ACCESS RIGHT 1. Ultrasound-guided vascular access, right common femoral artery 2. Celiac artery catheterization and angiogram (first order) 3. Superior mesenteric artery catheterization and angiogram (first order) 4. Inferior mesenteric artery catheterization and angiogram with additional  sub selective arteriography  as follows 5. Left colic artery catheterization and angiogram (second order) 6. Ascending branch of the left colic artery catheterization and angiogram (third order) 7. Marginal artery catheterization and angiogram (third order, additional after basic) 8. Coil embolization of marginal artery 9. Catheterization of the descending branch of the left colic artery with angiogram (third order, additional after basic) 10. Catheterization of a distal branch of the ascending branch of the left colic artery with angiogram (third order, additional after basic) MEDICATIONS: None. ANESTHESIA/SEDATION: Moderate (conscious) sedation was employed during this procedure. A total of Versed 1.5 mg and Fentanyl 50 mcg was administered intravenously. Moderate Sedation Time: 44 minutes. The patient's level of consciousness and vital signs were monitored continuously by radiology nursing throughout the procedure under my direct supervision. CONTRAST:  Approximately 75 mL Isovue 300 FLUOROSCOPY TIME:  Fluoroscopy Time: 13 minutes 24 seconds (907 mGy). COMPLICATIONS: None immediate. PROCEDURE: Informed consent was obtained from the patient following explanation of the procedure, risks, benefits and alternatives. The patient understands, agrees and consents for the procedure. All questions were addressed. A time out was performed prior to the initiation of the procedure. Maximal barrier sterile technique utilized including caps, mask, sterile gowns, sterile gloves, large sterile drape, hand hygiene, and Betadine prep. The right common femoral artery was interrogated with ultrasound and found to be widely patent. An image was obtained and stored for the medical record. Local anesthesia was attained by infiltration with 1% lidocaine. A small dermatotomy was made. Under real-time sonographic guidance, the vessel was punctured with a 21 gauge micropuncture needle. Using standard technique, the initial micro needle was  exchanged over a 0.018 micro wire for a transitional 4 [place] micro sheath. The micro sheath was then exchanged over a 0.035 wire for a 5 French vascular sheath. A Bentson wire was advanced in the abdominal aorta. A rim catheter was advanced over the wire. The rim catheter was used to select the inferior mesenteric artery. An arteriogram was performed. There is early nonvascular extravasation of contrast material arising from what appears to be the marginal artery at the splenic flexure. This corresponds with the site of bleeding seen on the recent tagged red blood cell study. Interestingly, there is a second site of apparent extravasation arising from a distal visceral branch of the ascending and division of the left colic artery in the proximal descending colon. This extravasation is less robust than the region of extravasation in the marginal artery. A renegade ST microcatheter was then advanced over a Fathom 16 wire in used to select the left colic artery. A left colic arteriogram was performed. The anatomy of the ascending and descending branches was successfully identified. There is persistent contrast extravasation from the marginal artery. No definite contrast extravasation noted on this second angiogram in the region of the descending colon. The catheter was further advanced into the ascending branch of the left colic artery. Arteriography was performed confirming continued abnormal extravasation of contrast from the marginal artery. This has an ovoid appearance and may represent pooling within a dependent diverticulum versus fusiform aneurysmal dilatation. The microcatheter was successfully advanced into the marginal artery. Contrast demonstrates frank extravasation. Coil embolization was performed using a series of 2 and 3 mm fibered detachable interlock coils. Post embolization arteriography confirms complete occlusion of this vessel with no further contrast extravasation. No collateral filling. The  microcatheter was brought back into the left colic artery and advanced into the descending division of the left colic artery to further evaluate the other potential site of  bleeding. Arteriography was performed. No evidence of bleeding in the proximal descending colon. The microcatheter was readvanced into the ascending branch and out into the distal visceral branch that appeared to be the site of bleeding on the initial angiogram. Arteriography was performed and carried out to the venous phase. Again, there is no evidence of bleeding at this site. No definite vessel abnormality was identified. No embolization was performed at this site. The microcatheter and rim catheter were removed. A C2 cobra catheter was advanced over a Bentson wire into the abdominal aorta and used to select the celiac artery. A celiac arteriogram was performed. Normal arterial anatomy. No evidence of replaced middle colic artery. No active bleeding. The superior mesenteric artery was then catheterized. Arteriography was performed. No evidence of back bleeding from the middle colic artery where it anastomosis with the left colic artery. No additional sites of bleeding were identified. The catheter was removed. A limited right common femoral arteriogram was performed. Hemostasis was attained with the assistance of an Angio-Seal device. IMPRESSION: 1. Positive angiography with potentially 2 sites of active bleeding in the region of the splenic flexure and proximal descending colon. 2. Successful coil embolization of the more definitive site of bleeding. 3. The second potential site of bleeding could not be identified on subsequent angiography and may have been artifactual. PLAN: 1. Patient is at risk for contrast induced nephropathy given baseline stage 3 chronic kidney disease. Recommend trending BUN and creatinine over the next 48 hours. Keep patient well hydrated. 2. Continue to trend H and H and transfuse as needed. 3. If there is evidence  of recurrent or persistent active lower GI bleeding, repeat angiography should be considered. Signed, Sterling Big, MD [facility] Radiology Electronically Signed   By: Malachy Moan M.D.   On: 03/07/2017 09:37   Ir Angiogram Selective Each Additional Vessel  Result Date: 03/07/2017 INDICATION: 84 year old female with acute lower GI bleed with the site of bleeding localized to the region of the splenic flexure on tagged RBC scan. She presents for visceral angiography and possible embolization. EXAM: IR EMBO ARTERIAL NOT HEMORR HEMANG INC GUIDE ROADMAPPING; SELECTIVE VISCERAL ARTERIOGRAPHY; ADDITIONAL ARTERIOGRAPHY; IR ULTRASOUND GUIDANCE VASC ACCESS RIGHT 1. Ultrasound-guided vascular access, right common femoral artery 2. Celiac artery catheterization and angiogram (first order) 3. Superior mesenteric artery catheterization and angiogram (first order) 4. Inferior mesenteric artery catheterization and angiogram with additional sub selective arteriography as follows 5. Left colic artery catheterization and angiogram (second order) 6. Ascending branch of the left colic artery catheterization and angiogram (third order) 7. Marginal artery catheterization and angiogram (third order, additional after basic) 8. Coil embolization of marginal artery 9. Catheterization of the descending branch of the left colic artery with angiogram (third order, additional after basic) 10. Catheterization of a distal branch of the ascending branch of the left colic artery with angiogram (third order, additional after basic) MEDICATIONS: None. ANESTHESIA/SEDATION: Moderate (conscious) sedation was employed during this procedure. A total of Versed 1.5 mg and Fentanyl 50 mcg was administered intravenously. Moderate Sedation Time: 44 minutes. The patient's level of consciousness and vital signs were monitored continuously by radiology nursing throughout the procedure under my  direct supervision. CONTRAST:  Approximately 75 mL Isovue 300 FLUOROSCOPY TIME:  Fluoroscopy Time: 13 minutes 24 seconds (907 mGy). COMPLICATIONS: None immediate. PROCEDURE: Informed consent was obtained from the patient following explanation of the procedure, risks, benefits and alternatives. The patient understands, agrees and consents for the procedure. All questions were  addressed. A time out was performed prior to the initiation of the procedure. Maximal barrier sterile technique utilized including caps, mask, sterile gowns, sterile gloves, large sterile drape, hand hygiene, and Betadine prep. The right common femoral artery was interrogated with ultrasound and found to be widely patent. An image was obtained and stored for the medical record. Local anesthesia was attained by infiltration with 1% lidocaine. A small dermatotomy was made. Under real-time sonographic guidance, the vessel was punctured with a 21 gauge micropuncture needle. Using standard technique, the initial micro needle was exchanged over a 0.018 micro wire for a transitional 4 [place] micro sheath. The micro sheath was then exchanged over a 0.035 wire for a 5 French vascular sheath. A Bentson wire was advanced in the abdominal aorta. A rim catheter was advanced over the wire. The rim catheter was used to select the inferior mesenteric artery. An arteriogram was performed. There is early nonvascular extravasation of contrast material arising from what appears to be the marginal artery at the splenic flexure. This corresponds with the site of bleeding seen on the recent tagged red blood cell study. Interestingly, there is a second site of apparent extravasation arising from a distal visceral branch of the ascending and division of the left colic artery in the proximal descending colon. This extravasation is less robust than the region of extravasation in the marginal artery. A renegade ST microcatheter was then advanced over a Fathom 16 wire in  used to select the left colic artery. A left colic arteriogram was performed. The anatomy of the ascending and descending branches was successfully identified. There is persistent contrast extravasation from the marginal artery. No definite contrast extravasation noted on this second angiogram in the region of the descending colon. The catheter was further advanced into the ascending branch of the left colic artery. Arteriography was performed confirming continued abnormal extravasation of contrast from the marginal artery. This has an ovoid appearance and may represent pooling within a dependent diverticulum versus fusiform aneurysmal dilatation. The microcatheter was successfully advanced into the marginal artery. Contrast demonstrates frank extravasation. Coil embolization was performed using a series of 2 and 3 mm fibered detachable interlock coils. Post embolization arteriography confirms complete occlusion of this vessel with no further contrast extravasation. No collateral filling. The microcatheter was brought back into the left colic artery and advanced into the descending division of the left colic artery to further evaluate the other potential site of bleeding. Arteriography was performed. No evidence of bleeding in the proximal descending colon. The microcatheter was readvanced into the ascending branch and out into the distal visceral branch that appeared to be the site of bleeding on the initial angiogram. Arteriography was performed and carried out to the venous phase. Again, there is no evidence of bleeding at this site. No definite vessel abnormality was identified. No embolization was performed at this site. The microcatheter and rim catheter were removed. A C2 cobra catheter was advanced over a Bentson wire into the abdominal aorta and used to select the celiac artery. A celiac arteriogram was performed. Normal arterial anatomy. No evidence of replaced middle colic artery. No active bleeding. The  superior mesenteric artery was then catheterized. Arteriography was performed. No evidence of back bleeding from the middle colic artery where it anastomosis with the left colic artery. No additional sites of bleeding were identified. The catheter was removed. A limited right common femoral arteriogram was performed. Hemostasis was attained with the assistance of an Angio-Seal device. IMPRESSION: 1. Positive angiography with  potentially 2 sites of active bleeding in the region of the splenic flexure and proximal descending colon. 2. Successful coil embolization of the more definitive site of bleeding. 3. The second potential site of bleeding could not be identified on subsequent angiography and may have been artifactual. PLAN: 1. Patient is at risk for contrast induced nephropathy given baseline stage 3 chronic kidney disease. Recommend trending BUN and creatinine over the next 48 hours. Keep patient well hydrated. 2. Continue to trend H and H and transfuse as needed. 3. If there is evidence of recurrent or persistent active lower GI bleeding, repeat angiography should be considered. Signed, Sterling Big, MD [facility] Radiology Electronically Signed   By: Malachy Moan M.D.   On: 03/07/2017 09:37   Ir US Guide Vasc Access Right  Result Date: 03/07/2017 INDICATION: 84 year old female with acute lower GI bleed with the site of bleeding localized to the region of the splenic flexure on tagged RBC scan. She presents for visceral angiography and possible embolization. EXAM: IR EMBO ARTERIAL NOT HEMORR HEMANG INC GUIDE ROADMAPPING; SELECTIVE VISCERAL ARTERIOGRAPHY; ADDITIONAL ARTERIOGRAPHY; IR ULTRASOUND GUIDANCE VASC ACCESS RIGHT 1. Ultrasound-guided vascular access, right common femoral artery 2. Celiac artery catheterization and angiogram (first order) 3. Superior mesenteric artery catheterization and angiogram (first order) 4. Inferior mesenteric artery  catheterization and angiogram with additional sub selective arteriography as follows 5. Left colic artery catheterization and angiogram (second order) 6. Ascending branch of the left colic artery catheterization and angiogram (third order) 7. Marginal artery catheterization and angiogram (third order, additional after basic) 8. Coil embolization of marginal artery 9. Catheterization of the descending branch of the left colic artery with angiogram (third order, additional after basic) 10. Catheterization of a distal branch of the ascending branch of the left colic artery with angiogram (third order, additional after basic) MEDICATIONS: None. ANESTHESIA/SEDATION: Moderate (conscious) sedation was employed during this procedure. A total of Versed 1.5 mg and Fentanyl 50 mcg was administered intravenously. Moderate Sedation Time: 44 minutes. The patient's level of consciousness and vital signs were monitored continuously by radiology nursing throughout the procedure under my direct supervision. CONTRAST:  Approximately 75 mL Isovue 300 FLUOROSCOPY TIME:  Fluoroscopy Time: 13 minutes 24 seconds (907 mGy). COMPLICATIONS: None immediate. PROCEDURE: Informed consent was obtained from the patient following explanation of the procedure, risks, benefits and alternatives. The patient understands, agrees and consents for the procedure. All questions were addressed. A time out was performed prior to the initiation of the procedure. Maximal barrier sterile technique utilized including caps, mask, sterile gowns, sterile gloves, large sterile drape, hand hygiene, and Betadine prep. The right common femoral artery was interrogated with ultrasound and found to be widely patent. An image was obtained and stored for the medical record. Local anesthesia was attained by infiltration with 1% lidocaine. A small dermatotomy was made. Under real-time sonographic guidance, the vessel was punctured with a 21 gauge micropuncture needle. Using  standard technique, the initial micro needle was exchanged over a 0.018 micro wire for a transitional 4 [place] micro sheath. The micro sheath was then exchanged over a 0.035 wire for a 5 French vascular sheath. A Bentson wire was advanced in the abdominal aorta. A rim catheter was advanced over the wire. The rim catheter was used to select the inferior mesenteric artery. An arteriogram was performed. There is early nonvascular extravasation of contrast material arising from what appears to be the marginal artery at the splenic flexure. This corresponds with the site  of bleeding seen on the recent tagged red blood cell study. Interestingly, there is a second site of apparent extravasation arising from a distal visceral branch of the ascending and division of the left colic artery in the proximal descending colon. This extravasation is less robust than the region of extravasation in the marginal artery. A renegade ST microcatheter was then advanced over a Fathom 16 wire in used to select the left colic artery. A left colic arteriogram was performed. The anatomy of the ascending and descending branches was successfully identified. There is persistent contrast extravasation from the marginal artery. No definite contrast extravasation noted on this second angiogram in the region of the descending colon. The catheter was further advanced into the ascending branch of the left colic artery. Arteriography was performed confirming continued abnormal extravasation of contrast from the marginal artery. This has an ovoid appearance and may represent pooling within a dependent diverticulum versus fusiform aneurysmal dilatation. The microcatheter was successfully advanced into the marginal artery. Contrast demonstrates frank extravasation. Coil embolization was performed using a series of 2 and 3 mm fibered detachable interlock coils. Post embolization arteriography confirms complete occlusion of this vessel with no further  contrast extravasation. No collateral filling. The microcatheter was brought back into the left colic artery and advanced into the descending division of the left colic artery to further evaluate the other potential site of bleeding. Arteriography was performed. No evidence of bleeding in the proximal descending colon. The microcatheter was readvanced into the ascending branch and out into the distal visceral branch that appeared to be the site of bleeding on the initial angiogram. Arteriography was performed and carried out to the venous phase. Again, there is no evidence of bleeding at this site. No definite vessel abnormality was identified. No embolization was performed at this site. The microcatheter and rim catheter were removed. A C2 cobra catheter was advanced over a Bentson wire into the abdominal aorta and used to select the celiac artery. A celiac arteriogram was performed. Normal arterial anatomy. No evidence of replaced middle colic artery. No active bleeding. The superior mesenteric artery was then catheterized. Arteriography was performed. No evidence of back bleeding from the middle colic artery where it anastomosis with the left colic artery. No additional sites of bleeding were identified. The catheter was removed. A limited right common femoral arteriogram was performed. Hemostasis was attained with the assistance of an Angio-Seal device. IMPRESSION: 1. Positive angiography with potentially 2 sites of active bleeding in the region of the splenic flexure and proximal descending colon. 2. Successful coil embolization of the more definitive site of bleeding. 3. The second potential site of bleeding could not be identified on subsequent angiography and may have been artifactual. PLAN: 1. Patient is at risk for contrast induced nephropathy given baseline stage 3 chronic kidney disease. Recommend trending BUN and creatinine over the next 48 hours. Keep patient well hydrated. 2. Continue to trend H and H  and transfuse as needed. 3. If there is evidence of recurrent or persistent active lower GI bleeding, repeat angiography should be considered. Signed, Sterling Big, MD [facility] Radiology Electronically Signed   By: Malachy Moan M.D.   On: 03/07/2017 09:37   Ir Embo [facility] Hem[facility] Lymph [facility]  Inc Guide Roadmapping  Result Date: 03/07/2017 INDICATION: 84 year old female with acute lower GI bleed with the site of bleeding localized to the region of the splenic flexure on tagged RBC scan. She presents for visceral angiography  and possible embolization. EXAM: IR EMBO ARTERIAL NOT HEMORR HEMANG INC GUIDE ROADMAPPING; SELECTIVE VISCERAL ARTERIOGRAPHY; ADDITIONAL ARTERIOGRAPHY; IR ULTRASOUND GUIDANCE VASC ACCESS RIGHT 1. Ultrasound-guided vascular access, right common femoral artery 2. Celiac artery catheterization and angiogram (first order) 3. Superior mesenteric artery catheterization and angiogram (first order) 4. Inferior mesenteric artery catheterization and angiogram with additional sub selective arteriography as follows 5. Left colic artery catheterization and angiogram (second order) 6. Ascending branch of the left colic artery catheterization and angiogram (third order) 7. Marginal artery catheterization and angiogram (third order, additional after basic) 8. Coil embolization of marginal artery 9. Catheterization of the descending branch of the left colic artery with angiogram (third order, additional after basic) 10. Catheterization of a distal branch of the ascending branch of the left colic artery with angiogram (third order, additional after basic) MEDICATIONS: None. ANESTHESIA/SEDATION: Moderate (conscious) sedation was employed during this procedure. A total of Versed 1.5 mg and Fentanyl 50 mcg was administered intravenously. Moderate Sedation Time: 44 minutes. The patient's level of consciousness and vital signs were monitored  continuously by radiology nursing throughout the procedure under my direct supervision. CONTRAST:  Approximately 75 mL Isovue 300 FLUOROSCOPY TIME:  Fluoroscopy Time: 13 minutes 24 seconds (907 mGy). COMPLICATIONS: None immediate. PROCEDURE: Informed consent was obtained from the patient following explanation of the procedure, risks, benefits and alternatives. The patient understands, agrees and consents for the procedure. All questions were addressed. A time out was performed prior to the initiation of the procedure. Maximal barrier sterile technique utilized including caps, mask, sterile gowns, sterile gloves, large sterile drape, hand hygiene, and Betadine prep. The right common femoral artery was interrogated with ultrasound and found to be widely patent. An image was obtained and stored for the medical record. Local anesthesia was attained by infiltration with 1% lidocaine. A small dermatotomy was made. Under real-time sonographic guidance, the vessel was punctured with a 21 gauge micropuncture needle. Using standard technique, the initial micro needle was exchanged over a 0.018 micro wire for a transitional 4 [place] micro sheath. The micro sheath was then exchanged over a 0.035 wire for a 5 French vascular sheath. A Bentson wire was advanced in the abdominal aorta. A rim catheter was advanced over the wire. The rim catheter was used to select the inferior mesenteric artery. An arteriogram was performed. There is early nonvascular extravasation of contrast material arising from what appears to be the marginal artery at the splenic flexure. This corresponds with the site of bleeding seen on the recent tagged red blood cell study. Interestingly, there is a second site of apparent extravasation arising from a distal visceral branch of the ascending and division of the left colic artery in the proximal descending colon. This extravasation is less robust than the region of extravasation in the marginal artery. A  renegade ST microcatheter was then advanced over a Fathom 16 wire in used to select the left colic artery. A left colic arteriogram was performed. The anatomy of the ascending and descending branches was successfully identified. There is persistent contrast extravasation from the marginal artery. No definite contrast extravasation noted on this second angiogram in the region of the descending colon. The catheter was further advanced into the ascending branch of the left colic artery. Arteriography was performed confirming continued abnormal extravasation of contrast from the marginal artery. This has an ovoid appearance and may represent pooling within a dependent diverticulum versus fusiform aneurysmal dilatation. The microcatheter was successfully advanced into the marginal artery. Contrast demonstrates frank extravasation. Coil  embolization was performed using a series of 2 and 3 mm fibered detachable interlock coils. Post embolization arteriography confirms complete occlusion of this vessel with no further contrast extravasation. No collateral filling. The microcatheter was brought back into the left colic artery and advanced into the descending division of the left colic artery to further evaluate the other potential site of bleeding. Arteriography was performed. No evidence of bleeding in the proximal descending colon. The microcatheter was readvanced into the ascending branch and out into the distal visceral branch that appeared to be the site of bleeding on the initial angiogram. Arteriography was performed and carried out to the venous phase. Again, there is no evidence of bleeding at this site. No definite vessel abnormality was identified. No embolization was performed at this site. The microcatheter and rim catheter were removed. A C2 cobra catheter was advanced over a Bentson wire into the abdominal aorta and used to select the celiac artery. A celiac arteriogram was performed. Normal arterial anatomy.  No evidence of replaced middle colic artery. No active bleeding. The superior mesenteric artery was then catheterized. Arteriography was performed. No evidence of back bleeding from the middle colic artery where it anastomosis with the left colic artery. No additional sites of bleeding were identified. The catheter was removed. A limited right common femoral arteriogram was performed. Hemostasis was attained with the assistance of an Angio-Seal device. IMPRESSION: 1. Positive angiography with potentially 2 sites of active bleeding in the region of the splenic flexure and proximal descending colon. 2. Successful coil embolization of the more definitive site of bleeding. 3. The second potential site of bleeding could not be identified on subsequent angiography and may have been artifactual. PLAN: 1. Patient is at risk for contrast induced nephropathy given baseline stage 3 chronic kidney disease. Recommend trending BUN and creatinine over the next 48 hours. Keep patient well hydrated. 2. Continue to trend H and H and transfuse as needed. 3. If there is evidence of recurrent or persistent active lower GI bleeding, repeat angiography should be considered. Signed, Sterling Big, MD [facility] Radiology Electronically Signed   By: Malachy Moan M.D.   On: 03/07/2017 09:37    Scheduled Meds: Continuous Infusions: . sodium chloride    . sodium chloride 75 mL/hr at 03/07/17 1529  . sodium chloride       LOS: 2 days   Time spent: 25 minutes.  Hazeline Junker, MD [facility] Pager [phone]  If 7PM-7AM, please contact night-coverage [email or web address] Password [facility] 03/08/2017, 12:11 PM

## 2017-03-08 NOTE — Procedures (Signed)
  Procedure: Mesenteric angio (2-vessel) with embolization IMA branch site of extravasation seen previously EBL:   minimal Complications:  none immediate  See full dictation in [facility] [employer] PACS.  Thora Lance. Sirron Francesconi MD Main # [phone] Pager  [phone]

## 2017-03-08 NOTE — Sedation Documentation (Signed)
Patient is resting comfortably. 

## 2017-03-08 NOTE — Progress Notes (Signed)
Right AC PIV site infiltrated w/NS.  PIV removed w/catheter intact; MIV changed to right lower forearm site.  Warm compresses placed to right AC and arm is elevated. Will continue to monitor.

## 2017-03-08 NOTE — Progress Notes (Signed)
Handoff given to Dahlia ClientHannah, RN on 2C.  Floor RN to assume care of dressing.  Dressing assessed with floor RN.

## 2017-03-08 NOTE — Progress Notes (Signed)
Referring Physician(s): Dr Aubery Lapping  Supervising Physician: Irish Lack  Patient Status:  [name], [name] - In-pt  Chief Complaint:  GI Bleed  Subjective:  03/06/17: IMPRESSION: 1. Positive angiography with potentially 2 sites of active bleeding in the region of the splenic flexure and proximal descending colon. 2. Successful coil embolization of the more definitive site of bleeding. 3. The second potential site of bleeding could not be identified on subsequent angiography and may have been artifactual  Pt is doing well Has had no bleeding since procedure Does report some maroon blood in BM yesterday---minimal amt Resting well; cl liq diet  Hg stable 7.6 today   Allergies: Ezetimibe; Lipitor [atorvastatin]; Pravachol [pravastatin]; Statins; Zocor [simvastatin]; and Cholestyramine  Medications: Prior to Admission medications   Medication Sig Start Date End Date Taking? Authorizing Provider  acetaminophen (TYLENOL) 325 MG tablet Take 325 mg by mouth every 6 (six) hours as needed. pain   Yes [provider]  aspirin EC 81 MG tablet Take 81 mg by mouth daily.   Yes [provider]  Cholecalciferol (VITAMIN D3) 2000 units TABS Take 2,000 Units by mouth daily.   Yes [provider]  diclofenac sodium (VOLTAREN) 1 % GEL Apply 2 g topically 2 (two) times daily.   Yes [provider]  famotidine (PEPCID) 40 MG tablet Take 40 mg by mouth daily. 02/12/17  Yes [provider]  ferrous sulfate 325 (65 FE) MG tablet Take 325 mg by mouth daily with breakfast.   Yes [provider]  furosemide (LASIX) 40 MG tablet Take 40 mg by mouth daily. 01/14/17  Yes [provider]  losartan-hydrochlorothiazide (HYZAAR) 100-12.5 MG tablet Take 1 tablet by mouth daily.   Yes [provider]  metoprolol tartrate (LOPRESSOR) 25 MG tablet Take 25 mg by mouth 2 (two) times daily.   Yes [provider]  Multiple Vitamin  (MULITIVITAMIN WITH MINERALS) TABS Take 1 tablet by mouth daily.   Yes [provider]  nitroGLYCERIN (NITROSTAT) 0.4 MG SL tablet Place 0.4 mg under the tongue every 5 (five) minutes as needed for chest pain.    Yes [provider]  polyethylene glycol powder (GLYCOLAX/MIRALAX) powder Take 17 g by mouth daily as needed for mild constipation.   Yes [provider]  PROAIR HFA 108 (90 Base) MCG/ACT inhaler Inhale 1-2 puffs into the lungs every 4 (four) hours as needed for shortness of breath or wheezing. 12/04/16  Yes [provider]     Vital Signs: BP (!) 91/46 (BP Location: Left Arm)   Pulse 62   Temp 98.2 F (36.8 C) (Oral)   Resp 11   Ht 5\' 3"  (1.6 m)   Wt 203 lb 3.2 oz (92.2 kg)   SpO2 100%   BMI 36.00 kg/m   Physical Exam  Constitutional: She is oriented to person, place, and time.  Abdominal: Soft. Bowel sounds are normal.  Musculoskeletal: Normal range of motion.  Neurological: She is alert and oriented to person, place, and time.  Skin: Skin is warm and dry.  Psychiatric: She has [name] normal mood and affect. Her behavior is normal.  Nursing note and vitals reviewed.   Imaging: Nm Gi Blood Loss  Result Date: 03/06/2017 CLINICAL DATA:  84 year old female with GI bleeding, anemia. EXAM: NUCLEAR MEDICINE GASTROINTESTINAL BLEEDING SCAN TECHNIQUE: Sequential abdominal images were obtained following intravenous administration of Tc-76m labeled red blood cells. RADIOPHARMACEUTICALS:  25.4 mCi Tc-61m pertechnetate in-vitro labeled red cells. COMPARISON:  CT Abdomen and  Pelvis 03/08/2015 FINDINGS: Physiologic blood pool activity is present, but almost immediately there is abnormal radiotracer activity within bowel loops of the left abdomen. This appears to originate at the splenic flexure, and then transit is in both the distal transverse colon and the descending colon to the sigmoid region. Widespread diverticulosis in the large bowel on the comparison  CT Abdomen and Pelvis. IMPRESSION: Positive for active GI bleeding. Bleeding origin appears to be the splenic flexure of colon. Critical Value/emergent results were called by telephone at the time of interpretation on 03/06/2017 at 1347 hr to Dr. Luberta Robertson in the ED, who verbally acknowledged these results. Electronically Signed   By: Odessa Fleming M.D.   On: 03/06/2017 14:04   Ir Angiogram Visceral Selective  Result Date: 03/07/2017 INDICATION: 84 year old female with acute lower GI bleed with the site of bleeding localized to the region of the splenic flexure on tagged RBC scan. She presents for visceral angiography and possible embolization. EXAM: IR EMBO ARTERIAL NOT HEMORR HEMANG [name] GUIDE ROADMAPPING; SELECTIVE VISCERAL ARTERIOGRAPHY; ADDITIONAL ARTERIOGRAPHY; IR ULTRASOUND GUIDANCE VASC ACCESS RIGHT 1. Ultrasound-guided vascular access, right common femoral artery 2. Celiac artery catheterization and angiogram (first order) 3. Superior mesenteric artery catheterization and angiogram (first order) 4. Inferior mesenteric artery catheterization and angiogram with additional sub selective arteriography as follows 5. Left colic artery catheterization and angiogram (second order) 6. Ascending branch of the left colic artery catheterization and angiogram (third order) 7. Marginal artery catheterization and angiogram (third order, additional after basic) 8. Coil embolization of marginal artery 9. Catheterization of the descending branch of the left colic artery with angiogram (third order, additional after basic) 10. Catheterization of [name] distal branch of the ascending branch of the left colic artery with angiogram (third order, additional after basic) MEDICATIONS: None. ANESTHESIA/SEDATION: Moderate (conscious) sedation was employed during this procedure. [name] total of Versed 1.5 mg and Fentanyl 50 mcg was administered intravenously. Moderate Sedation Time: 44 minutes. The patient's level of consciousness and vital signs  were monitored continuously by radiology nursing throughout the procedure under my direct supervision. CONTRAST:  Approximately 75 mL Isovue 300 FLUOROSCOPY TIME:  Fluoroscopy Time: 13 minutes 24 seconds (907 mGy). COMPLICATIONS: None immediate. PROCEDURE: Informed consent was obtained from the patient following explanation of the procedure, risks, benefits and alternatives. The patient understands, agrees and consents for the procedure. All questions were addressed. [name] time out was performed prior to the initiation of the procedure. Maximal barrier sterile technique utilized including caps, mask, sterile gowns, sterile gloves, large sterile drape, hand hygiene, and Betadine prep. The right common femoral artery was interrogated with ultrasound and found to be widely patent. An image was obtained and stored for the medical record. Local anesthesia was attained by infiltration with 1% lidocaine. [name] small dermatotomy was made. Under real-time sonographic guidance, the vessel was punctured with [name] 21 gauge micropuncture needle. Using standard technique, the initial micro needle was exchanged over [name] 0.018 micro wire for [name] transitional 4 [place] micro sheath. The micro sheath was then exchanged over [name] 0.035 wire for [name] 5 French vascular sheath. [name] Bentson wire was advanced in the abdominal aorta. [name] rim catheter was advanced over the wire. The rim catheter was used to select the inferior mesenteric artery. An arteriogram was performed. There is early nonvascular extravasation of contrast material arising from what appears to be the marginal artery at the splenic flexure. This corresponds with the site of bleeding seen on the recent tagged red blood cell study. Interestingly, there is  [name] second site of apparent extravasation arising from [name] distal visceral branch of the ascending and division of the left colic artery in the proximal descending colon. This extravasation is less robust than the region of extravasation in the  marginal artery. [name] renegade ST microcatheter was then advanced over [name] Fathom 16 wire in used to select the left colic artery. [name] left colic arteriogram was performed. The anatomy of the ascending and descending branches was successfully identified. There is persistent contrast extravasation from the marginal artery. No definite contrast extravasation noted on this second angiogram in the region of the descending colon. The catheter was further advanced into the ascending branch of the left colic artery. Arteriography was performed confirming continued abnormal extravasation of contrast from the marginal artery. This has an ovoid appearance and may represent pooling within [name] dependent diverticulum versus fusiform aneurysmal dilatation. The microcatheter was successfully advanced into the marginal artery. Contrast demonstrates frank extravasation. Coil embolization was performed using [name] series of 2 and 3 mm fibered detachable interlock coils. Post embolization arteriography confirms complete occlusion of this vessel with no further contrast extravasation. No collateral filling. The microcatheter was brought back into the left colic artery and advanced into the descending division of the left colic artery to further evaluate the other potential site of bleeding. Arteriography was performed. No evidence of bleeding in the proximal descending colon. The microcatheter was readvanced into the ascending branch and out into the distal visceral branch that appeared to be the site of bleeding on the initial angiogram. Arteriography was performed and carried out to the venous phase. Again, there is no evidence of bleeding at this site. No definite vessel abnormality was identified. No embolization was performed at this site. The microcatheter and rim catheter were removed. [name] C2 cobra catheter was advanced over [name] Bentson wire into the abdominal aorta and used to select the celiac artery. [name] celiac arteriogram was performed. Normal  arterial anatomy. No evidence of replaced middle colic artery. No active bleeding. The superior mesenteric artery was then catheterized. Arteriography was performed. No evidence of back bleeding from the middle colic artery where it anastomosis with the left colic artery. No additional sites of bleeding were identified. The catheter was removed. [name] limited right common femoral arteriogram was performed. Hemostasis was attained with the assistance of an Angio-Seal device. IMPRESSION: 1. Positive angiography with potentially 2 sites of active bleeding in the region of the splenic flexure and proximal descending colon. 2. Successful coil embolization of the more definitive site of bleeding. 3. The second potential site of bleeding could not be identified on subsequent angiography and may have been artifactual. PLAN: 1. Patient is at risk for contrast induced nephropathy given baseline stage 3 chronic kidney disease. Recommend trending BUN and creatinine over the next 48 hours. Keep patient well hydrated. 2. Continue to trend H and H and transfuse as needed. 3. If there is evidence of recurrent or persistent active lower GI bleeding, repeat angiography should be considered. Signed, Sterling Big, MD [facility] Electronically Signed   By: Malachy Moan M.D.   On: 03/07/2017 09:37   Ir Angiogram Visceral Selective  Result Date: 03/07/2017 INDICATION: 84 year old female with acute lower GI bleed with the site of bleeding localized to the region of the splenic flexure on tagged RBC scan. She presents for visceral angiography and possible embolization. EXAM: IR EMBO ARTERIAL NOT HEMORR HEMANG [name] GUIDE ROADMAPPING; SELECTIVE VISCERAL ARTERIOGRAPHY; ADDITIONAL ARTERIOGRAPHY; IR ULTRASOUND GUIDANCE VASC  ACCESS RIGHT 1. Ultrasound-guided vascular access, right common femoral artery 2. Celiac artery catheterization and angiogram (first order) 3. Superior  mesenteric artery catheterization and angiogram (first order) 4. Inferior mesenteric artery catheterization and angiogram with additional sub selective arteriography as follows 5. Left colic artery catheterization and angiogram (second order) 6. Ascending branch of the left colic artery catheterization and angiogram (third order) 7. Marginal artery catheterization and angiogram (third order, additional after basic) 8. Coil embolization of marginal artery 9. Catheterization of the descending branch of the left colic artery with angiogram (third order, additional after basic) 10. Catheterization of [name] distal branch of the ascending branch of the left colic artery with angiogram (third order, additional after basic) MEDICATIONS: None. ANESTHESIA/SEDATION: Moderate (conscious) sedation was employed during this procedure. [name] total of Versed 1.5 mg and Fentanyl 50 mcg was administered intravenously. Moderate Sedation Time: 44 minutes. The patient's level of consciousness and vital signs were monitored continuously by radiology nursing throughout the procedure under my direct supervision. CONTRAST:  Approximately 75 mL Isovue 300 FLUOROSCOPY TIME:  Fluoroscopy Time: 13 minutes 24 seconds (907 mGy). COMPLICATIONS: None immediate. PROCEDURE: Informed consent was obtained from the patient following explanation of the procedure, risks, benefits and alternatives. The patient understands, agrees and consents for the procedure. All questions were addressed. [name] time out was performed prior to the initiation of the procedure. Maximal barrier sterile technique utilized including caps, mask, sterile gowns, sterile gloves, large sterile drape, hand hygiene, and Betadine prep. The right common femoral artery was interrogated with ultrasound and found to be widely patent. An image was obtained and stored for the medical record. Local anesthesia was attained by infiltration with 1% lidocaine. [name] small dermatotomy was made. Under real-time  sonographic guidance, the vessel was punctured with [name] 21 gauge micropuncture needle. Using standard technique, the initial micro needle was exchanged over [name] 0.018 micro wire for [name] transitional 4 [place] micro sheath. The micro sheath was then exchanged over [name] 0.035 wire for [name] 5 French vascular sheath. [name] Bentson wire was advanced in the abdominal aorta. [name] rim catheter was advanced over the wire. The rim catheter was used to select the inferior mesenteric artery. An arteriogram was performed. There is early nonvascular extravasation of contrast material arising from what appears to be the marginal artery at the splenic flexure. This corresponds with the site of bleeding seen on the recent tagged red blood cell study. Interestingly, there is [name] second site of apparent extravasation arising from [name] distal visceral branch of the ascending and division of the left colic artery in the proximal descending colon. This extravasation is less robust than the region of extravasation in the marginal artery. [name] renegade ST microcatheter was then advanced over [name] Fathom 16 wire in used to select the left colic artery. [name] left colic arteriogram was performed. The anatomy of the ascending and descending branches was successfully identified. There is persistent contrast extravasation from the marginal artery. No definite contrast extravasation noted on this second angiogram in the region of the descending colon. The catheter was further advanced into the ascending branch of the left colic artery. Arteriography was performed confirming continued abnormal extravasation of contrast from the marginal artery. This has an ovoid appearance and may represent pooling within [name] dependent diverticulum versus fusiform aneurysmal dilatation. The microcatheter was successfully advanced into the marginal artery. Contrast demonstrates frank extravasation. Coil embolization was performed using [name] series of 2 and 3 mm fibered detachable interlock coils. Post  embolization arteriography confirms complete occlusion  of this vessel with no further contrast extravasation. No collateral filling. The microcatheter was brought back into the left colic artery and advanced into the descending division of the left colic artery to further evaluate the other potential site of bleeding. Arteriography was performed. No evidence of bleeding in the proximal descending colon. The microcatheter was readvanced into the ascending branch and out into the distal visceral branch that appeared to be the site of bleeding on the initial angiogram. Arteriography was performed and carried out to the venous phase. Again, there is no evidence of bleeding at this site. No definite vessel abnormality was identified. No embolization was performed at this site. The microcatheter and rim catheter were removed. [name] C2 cobra catheter was advanced over [name] Bentson wire into the abdominal aorta and used to select the celiac artery. [name] celiac arteriogram was performed. Normal arterial anatomy. No evidence of replaced middle colic artery. No active bleeding. The superior mesenteric artery was then catheterized. Arteriography was performed. No evidence of back bleeding from the middle colic artery where it anastomosis with the left colic artery. No additional sites of bleeding were identified. The catheter was removed. [name] limited right common femoral arteriogram was performed. Hemostasis was attained with the assistance of an Angio-Seal device. IMPRESSION: 1. Positive angiography with potentially 2 sites of active bleeding in the region of the splenic flexure and proximal descending colon. 2. Successful coil embolization of the more definitive site of bleeding. 3. The second potential site of bleeding could not be identified on subsequent angiography and may have been artifactual. PLAN: 1. Patient is at risk for contrast induced nephropathy given baseline stage 3 chronic kidney disease. Recommend trending BUN and  creatinine over the next 48 hours. Keep patient well hydrated. 2. Continue to trend H and H and transfuse as needed. 3. If there is evidence of recurrent or persistent active lower GI bleeding, repeat angiography should be considered. Signed, Sterling Big, MD [name] Electronically Signed   By: Malachy Moan M.D.   On: 03/07/2017 09:37   Ir Angiogram Visceral Selective  Result Date: 03/07/2017 INDICATION: 84 year old female with acute lower GI bleed with the site of bleeding localized to the region of the splenic flexure on tagged RBC scan. She presents for visceral angiography and possible embolization. EXAM: IR EMBO ARTERIAL NOT HEMORR HEMANG [name] GUIDE ROADMAPPING; SELECTIVE VISCERAL ARTERIOGRAPHY; ADDITIONAL ARTERIOGRAPHY; IR ULTRASOUND GUIDANCE VASC ACCESS RIGHT 1. Ultrasound-guided vascular access, right common femoral artery 2. Celiac artery catheterization and angiogram (first order) 3. Superior mesenteric artery catheterization and angiogram (first order) 4. Inferior mesenteric artery catheterization and angiogram with additional sub selective arteriography as follows 5. Left colic artery catheterization and angiogram (second order) 6. Ascending branch of the left colic artery catheterization and angiogram (third order) 7. Marginal artery catheterization and angiogram (third order, additional after basic) 8. Coil embolization of marginal artery 9. Catheterization of the descending branch of the left colic artery with angiogram (third order, additional after basic) 10. Catheterization of [name] distal branch of the ascending branch of the left colic artery with angiogram (third order, additional after basic) MEDICATIONS: None. ANESTHESIA/SEDATION: Moderate (conscious) sedation was employed during this procedure. [name] total of Versed 1.5 mg and Fentanyl 50 mcg was administered intravenously. Moderate Sedation Time: 44 minutes. The patient's  level of consciousness and vital signs were monitored continuously by radiology nursing throughout the procedure under my direct supervision. CONTRAST:  Approximately 75 mL Isovue 300 FLUOROSCOPY TIME:  Fluoroscopy Time:  13 minutes 24 seconds (907 mGy). COMPLICATIONS: None immediate. PROCEDURE: Informed consent was obtained from the patient following explanation of the procedure, risks, benefits and alternatives. The patient understands, agrees and consents for the procedure. All questions were addressed. [name] time out was performed prior to the initiation of the procedure. Maximal barrier sterile technique utilized including caps, mask, sterile gowns, sterile gloves, large sterile drape, hand hygiene, and Betadine prep. The right common femoral artery was interrogated with ultrasound and found to be widely patent. An image was obtained and stored for the medical record. Local anesthesia was attained by infiltration with 1% lidocaine. [name] small dermatotomy was made. Under real-time sonographic guidance, the vessel was punctured with [name] 21 gauge micropuncture needle. Using standard technique, the initial micro needle was exchanged over [name] 0.018 micro wire for [name] transitional 4 [place] micro sheath. The micro sheath was then exchanged over [name] 0.035 wire for [name] 5 French vascular sheath. [name] Bentson wire was advanced in the abdominal aorta. [name] rim catheter was advanced over the wire. The rim catheter was used to select the inferior mesenteric artery. An arteriogram was performed. There is early nonvascular extravasation of contrast material arising from what appears to be the marginal artery at the splenic flexure. This corresponds with the site of bleeding seen on the recent tagged red blood cell study. Interestingly, there is [name] second site of apparent extravasation arising from [name] distal visceral branch of the ascending and division of the left colic artery in the proximal descending colon. This extravasation is less robust than the  region of extravasation in the marginal artery. [name] renegade ST microcatheter was then advanced over [name] Fathom 16 wire in used to select the left colic artery. [name] left colic arteriogram was performed. The anatomy of the ascending and descending branches was successfully identified. There is persistent contrast extravasation from the marginal artery. No definite contrast extravasation noted on this second angiogram in the region of the descending colon. The catheter was further advanced into the ascending branch of the left colic artery. Arteriography was performed confirming continued abnormal extravasation of contrast from the marginal artery. This has an ovoid appearance and may represent pooling within [name] dependent diverticulum versus fusiform aneurysmal dilatation. The microcatheter was successfully advanced into the marginal artery. Contrast demonstrates frank extravasation. Coil embolization was performed using [name] series of 2 and 3 mm fibered detachable interlock coils. Post embolization arteriography confirms complete occlusion of this vessel with no further contrast extravasation. No collateral filling. The microcatheter was brought back into the left colic artery and advanced into the descending division of the left colic artery to further evaluate the other potential site of bleeding. Arteriography was performed. No evidence of bleeding in the proximal descending colon. The microcatheter was readvanced into the ascending branch and out into the distal visceral branch that appeared to be the site of bleeding on the initial angiogram. Arteriography was performed and carried out to the venous phase. Again, there is no evidence of bleeding at this site. No definite vessel abnormality was identified. No embolization was performed at this site. The microcatheter and rim catheter were removed. [name] C2 cobra catheter was advanced over [name] Bentson wire into the abdominal aorta and used to select the celiac artery. [name] celiac  arteriogram was performed. Normal arterial anatomy. No evidence of replaced middle colic artery. No active bleeding. The superior mesenteric artery was then catheterized. Arteriography was performed. No evidence of back bleeding from the middle colic artery where it anastomosis with  the left colic artery. No additional sites of bleeding were identified. The catheter was removed. [name] limited right common femoral arteriogram was performed. Hemostasis was attained with the assistance of an Angio-Seal device. IMPRESSION: 1. Positive angiography with potentially 2 sites of active bleeding in the region of the splenic flexure and proximal descending colon. 2. Successful coil embolization of the more definitive site of bleeding. 3. The second potential site of bleeding could not be identified on subsequent angiography and may have been artifactual. PLAN: 1. Patient is at risk for contrast induced nephropathy given baseline stage 3 chronic kidney disease. Recommend trending BUN and creatinine over the next 48 hours. Keep patient well hydrated. 2. Continue to trend H and H and transfuse as needed. 3. If there is evidence of recurrent or persistent active lower GI bleeding, repeat angiography should be considered. Signed, Sterling BigHeath K. McCullough, MD [name] Electronically Signed   By: Malachy MoanHeath  McCullough M.D.   On: 03/07/2017 09:37   Ir Angiogram Selective Each Additional Vessel  Result Date: 03/07/2017 INDICATION: 84 year old female with acute lower GI bleed with the site of bleeding localized to the region of the splenic flexure on tagged RBC scan. She presents for visceral angiography and possible embolization. EXAM: IR EMBO ARTERIAL NOT HEMORR HEMANG [name] GUIDE ROADMAPPING; SELECTIVE VISCERAL ARTERIOGRAPHY; ADDITIONAL ARTERIOGRAPHY; IR ULTRASOUND GUIDANCE VASC ACCESS RIGHT 1. Ultrasound-guided vascular access, right common femoral artery 2. Celiac artery  catheterization and angiogram (first order) 3. Superior mesenteric artery catheterization and angiogram (first order) 4. Inferior mesenteric artery catheterization and angiogram with additional sub selective arteriography as follows 5. Left colic artery catheterization and angiogram (second order) 6. Ascending branch of the left colic artery catheterization and angiogram (third order) 7. Marginal artery catheterization and angiogram (third order, additional after basic) 8. Coil embolization of marginal artery 9. Catheterization of the descending branch of the left colic artery with angiogram (third order, additional after basic) 10. Catheterization of [name] distal branch of the ascending branch of the left colic artery with angiogram (third order, additional after basic) MEDICATIONS: None. ANESTHESIA/SEDATION: Moderate (conscious) sedation was employed during this procedure. [name] total of Versed 1.5 mg and Fentanyl 50 mcg was administered intravenously. Moderate Sedation Time: 44 minutes. The patient's level of consciousness and vital signs were monitored continuously by radiology nursing throughout the procedure under my direct supervision. CONTRAST:  Approximately 75 mL Isovue 300 FLUOROSCOPY TIME:  Fluoroscopy Time: 13 minutes 24 seconds (907 mGy). COMPLICATIONS: None immediate. PROCEDURE: Informed consent was obtained from the patient following explanation of the procedure, risks, benefits and alternatives. The patient understands, agrees and consents for the procedure. All questions were addressed. [name] time out was performed prior to the initiation of the procedure. Maximal barrier sterile technique utilized including caps, mask, sterile gowns, sterile gloves, large sterile drape, hand hygiene, and Betadine prep. The right common femoral artery was interrogated with ultrasound and found to be widely patent. An image was obtained and stored for the medical record. Local anesthesia was attained by infiltration with 1%  lidocaine. [name] small dermatotomy was made. Under real-time sonographic guidance, the vessel was punctured with [name] 21 gauge micropuncture needle. Using standard technique, the initial micro needle was exchanged over [name] 0.018 micro wire for [name] transitional 4 [place] micro sheath. The micro sheath was then exchanged over [name] 0.035 wire for [name] 5 French vascular sheath. [name] Bentson wire was advanced in the abdominal aorta. [name] rim catheter was advanced over the wire. The rim catheter  was used to select the inferior mesenteric artery. An arteriogram was performed. There is early nonvascular extravasation of contrast material arising from what appears to be the marginal artery at the splenic flexure. This corresponds with the site of bleeding seen on the recent tagged red blood cell study. Interestingly, there is [name] second site of apparent extravasation arising from [name] distal visceral branch of the ascending and division of the left colic artery in the proximal descending colon. This extravasation is less robust than the region of extravasation in the marginal artery. [name] renegade ST microcatheter was then advanced over [name] Fathom 16 wire in used to select the left colic artery. [name] left colic arteriogram was performed. The anatomy of the ascending and descending branches was successfully identified. There is persistent contrast extravasation from the marginal artery. No definite contrast extravasation noted on this second angiogram in the region of the descending colon. The catheter was further advanced into the ascending branch of the left colic artery. Arteriography was performed confirming continued abnormal extravasation of contrast from the marginal artery. This has an ovoid appearance and may represent pooling within [name] dependent diverticulum versus fusiform aneurysmal dilatation. The microcatheter was successfully advanced into the marginal artery. Contrast demonstrates frank extravasation. Coil embolization was performed using [name] series  of 2 and 3 mm fibered detachable interlock coils. Post embolization arteriography confirms complete occlusion of this vessel with no further contrast extravasation. No collateral filling. The microcatheter was brought back into the left colic artery and advanced into the descending division of the left colic artery to further evaluate the other potential site of bleeding. Arteriography was performed. No evidence of bleeding in the proximal descending colon. The microcatheter was readvanced into the ascending branch and out into the distal visceral branch that appeared to be the site of bleeding on the initial angiogram. Arteriography was performed and carried out to the venous phase. Again, there is no evidence of bleeding at this site. No definite vessel abnormality was identified. No embolization was performed at this site. The microcatheter and rim catheter were removed. [name] C2 cobra catheter was advanced over [name] Bentson wire into the abdominal aorta and used to select the celiac artery. [name] celiac arteriogram was performed. Normal arterial anatomy. No evidence of replaced middle colic artery. No active bleeding. The superior mesenteric artery was then catheterized. Arteriography was performed. No evidence of back bleeding from the middle colic artery where it anastomosis with the left colic artery. No additional sites of bleeding were identified. The catheter was removed. [name] limited right common femoral arteriogram was performed. Hemostasis was attained with the assistance of an Angio-Seal device. IMPRESSION: 1. Positive angiography with potentially 2 sites of active bleeding in the region of the splenic flexure and proximal descending colon. 2. Successful coil embolization of the more definitive site of bleeding. 3. The second potential site of bleeding could not be identified on subsequent angiography and may have been artifactual. PLAN: 1. Patient is at risk for contrast induced nephropathy given baseline stage 3  chronic kidney disease. Recommend trending BUN and creatinine over the next 48 hours. Keep patient well hydrated. 2. Continue to trend H and H and transfuse as needed. 3. If there is evidence of recurrent or persistent active lower GI bleeding, repeat angiography should be considered. Signed, Sterling Big, MD [name] Electronically Signed   By: Malachy Moan M.D.   On: 03/07/2017 09:37   Ir Angiogram Selective Each Additional Vessel  Result  Date: 03/07/2017 INDICATION: 84 year old female with acute lower GI bleed with the site of bleeding localized to the region of the splenic flexure on tagged RBC scan. She presents for visceral angiography and possible embolization. EXAM: IR EMBO ARTERIAL NOT HEMORR HEMANG [name] GUIDE ROADMAPPING; SELECTIVE VISCERAL ARTERIOGRAPHY; ADDITIONAL ARTERIOGRAPHY; IR ULTRASOUND GUIDANCE VASC ACCESS RIGHT 1. Ultrasound-guided vascular access, right common femoral artery 2. Celiac artery catheterization and angiogram (first order) 3. Superior mesenteric artery catheterization and angiogram (first order) 4. Inferior mesenteric artery catheterization and angiogram with additional sub selective arteriography as follows 5. Left colic artery catheterization and angiogram (second order) 6. Ascending branch of the left colic artery catheterization and angiogram (third order) 7. Marginal artery catheterization and angiogram (third order, additional after basic) 8. Coil embolization of marginal artery 9. Catheterization of the descending branch of the left colic artery with angiogram (third order, additional after basic) 10. Catheterization of [name] distal branch of the ascending branch of the left colic artery with angiogram (third order, additional after basic) MEDICATIONS: None. ANESTHESIA/SEDATION: Moderate (conscious) sedation was employed during this procedure. [name] total of Versed 1.5 mg and Fentanyl 50 mcg was administered  intravenously. Moderate Sedation Time: 44 minutes. The patient's level of consciousness and vital signs were monitored continuously by radiology nursing throughout the procedure under my direct supervision. CONTRAST:  Approximately 75 mL Isovue 300 FLUOROSCOPY TIME:  Fluoroscopy Time: 13 minutes 24 seconds (907 mGy). COMPLICATIONS: None immediate. PROCEDURE: Informed consent was obtained from the patient following explanation of the procedure, risks, benefits and alternatives. The patient understands, agrees and consents for the procedure. All questions were addressed. [name] time out was performed prior to the initiation of the procedure. Maximal barrier sterile technique utilized including caps, mask, sterile gowns, sterile gloves, large sterile drape, hand hygiene, and Betadine prep. The right common femoral artery was interrogated with ultrasound and found to be widely patent. An image was obtained and stored for the medical record. Local anesthesia was attained by infiltration with 1% lidocaine. [name] small dermatotomy was made. Under real-time sonographic guidance, the vessel was punctured with [name] 21 gauge micropuncture needle. Using standard technique, the initial micro needle was exchanged over [name] 0.018 micro wire for [name] transitional 4 [place] micro sheath. The micro sheath was then exchanged over [name] 0.035 wire for [name] 5 French vascular sheath. [name] Bentson wire was advanced in the abdominal aorta. [name] rim catheter was advanced over the wire. The rim catheter was used to select the inferior mesenteric artery. An arteriogram was performed. There is early nonvascular extravasation of contrast material arising from what appears to be the marginal artery at the splenic flexure. This corresponds with the site of bleeding seen on the recent tagged red blood cell study. Interestingly, there is [name] second site of apparent extravasation arising from [name] distal visceral branch of the ascending and division of the left colic artery in the  proximal descending colon. This extravasation is less robust than the region of extravasation in the marginal artery. [name] renegade ST microcatheter was then advanced over [name] Fathom 16 wire in used to select the left colic artery. [name] left colic arteriogram was performed. The anatomy of the ascending and descending branches was successfully identified. There is persistent contrast extravasation from the marginal artery. No definite contrast extravasation noted on this second angiogram in the region of the descending colon. The catheter was further advanced into the ascending branch of the left colic artery. Arteriography was performed confirming continued abnormal extravasation of contrast from the marginal  artery. This has an ovoid appearance and may represent pooling within [name] dependent diverticulum versus fusiform aneurysmal dilatation. The microcatheter was successfully advanced into the marginal artery. Contrast demonstrates frank extravasation. Coil embolization was performed using [name] series of 2 and 3 mm fibered detachable interlock coils. Post embolization arteriography confirms complete occlusion of this vessel with no further contrast extravasation. No collateral filling. The microcatheter was brought back into the left colic artery and advanced into the descending division of the left colic artery to further evaluate the other potential site of bleeding. Arteriography was performed. No evidence of bleeding in the proximal descending colon. The microcatheter was readvanced into the ascending branch and out into the distal visceral branch that appeared to be the site of bleeding on the initial angiogram. Arteriography was performed and carried out to the venous phase. Again, there is no evidence of bleeding at this site. No definite vessel abnormality was identified. No embolization was performed at this site. The microcatheter and rim catheter were removed. [name] C2 cobra catheter was advanced over [name] Bentson wire into  the abdominal aorta and used to select the celiac artery. [name] celiac arteriogram was performed. Normal arterial anatomy. No evidence of replaced middle colic artery. No active bleeding. The superior mesenteric artery was then catheterized. Arteriography was performed. No evidence of back bleeding from the middle colic artery where it anastomosis with the left colic artery. No additional sites of bleeding were identified. The catheter was removed. [name] limited right common femoral arteriogram was performed. Hemostasis was attained with the assistance of an Angio-Seal device. IMPRESSION: 1. Positive angiography with potentially 2 sites of active bleeding in the region of the splenic flexure and proximal descending colon. 2. Successful coil embolization of the more definitive site of bleeding. 3. The second potential site of bleeding could not be identified on subsequent angiography and may have been artifactual. PLAN: 1. Patient is at risk for contrast induced nephropathy given baseline stage 3 chronic kidney disease. Recommend trending BUN and creatinine over the next 48 hours. Keep patient well hydrated. 2. Continue to trend H and H and transfuse as needed. 3. If there is evidence of recurrent or persistent active lower GI bleeding, repeat angiography should be considered. Signed, Sterling Big, MD [facility] Electronically Signed   By: Malachy Moan M.D.   On: 03/07/2017 09:37   Ir Angiogram Selective Each Additional Vessel  Result Date: 03/07/2017 INDICATION: 84 year old female with acute lower GI bleed with the site of bleeding localized to the region of the splenic flexure on tagged RBC scan. She presents for visceral angiography and possible embolization. EXAM: IR EMBO ARTERIAL NOT HEMORR HEMANG [name] GUIDE ROADMAPPING; SELECTIVE VISCERAL ARTERIOGRAPHY; ADDITIONAL ARTERIOGRAPHY; IR ULTRASOUND GUIDANCE VASC ACCESS RIGHT 1. Ultrasound-guided  vascular access, right common femoral artery 2. Celiac artery catheterization and angiogram (first order) 3. Superior mesenteric artery catheterization and angiogram (first order) 4. Inferior mesenteric artery catheterization and angiogram with additional sub selective arteriography as follows 5. Left colic artery catheterization and angiogram (second order) 6. Ascending branch of the left colic artery catheterization and angiogram (third order) 7. Marginal artery catheterization and angiogram (third order, additional after basic) 8. Coil embolization of marginal artery 9. Catheterization of the descending branch of the left colic artery with angiogram (third order, additional after basic) 10. Catheterization of [name] distal branch of the ascending branch of the left colic artery with angiogram (third order, additional after basic) MEDICATIONS: None. ANESTHESIA/SEDATION: Moderate (conscious) sedation was  employed during this procedure. [name] total of Versed 1.5 mg and Fentanyl 50 mcg was administered intravenously. Moderate Sedation Time: 44 minutes. The patient's level of consciousness and vital signs were monitored continuously by radiology nursing throughout the procedure under my direct supervision. CONTRAST:  Approximately 75 mL Isovue 300 FLUOROSCOPY TIME:  Fluoroscopy Time: 13 minutes 24 seconds (907 mGy). COMPLICATIONS: None immediate. PROCEDURE: Informed consent was obtained from the patient following explanation of the procedure, risks, benefits and alternatives. The patient understands, agrees and consents for the procedure. All questions were addressed. [name] time out was performed prior to the initiation of the procedure. Maximal barrier sterile technique utilized including caps, mask, sterile gowns, sterile gloves, large sterile drape, hand hygiene, and Betadine prep. The right common femoral artery was interrogated with ultrasound and found to be widely patent. An image was obtained and stored for the medical  record. Local anesthesia was attained by infiltration with 1% lidocaine. [name] small dermatotomy was made. Under real-time sonographic guidance, the vessel was punctured with [name] 21 gauge micropuncture needle. Using standard technique, the initial micro needle was exchanged over [name] 0.018 micro wire for [name] transitional 4 [place] micro sheath. The micro sheath was then exchanged over [name] 0.035 wire for [name] 5 French vascular sheath. [name] Bentson wire was advanced in the abdominal aorta. [name] rim catheter was advanced over the wire. The rim catheter was used to select the inferior mesenteric artery. An arteriogram was performed. There is early nonvascular extravasation of contrast material arising from what appears to be the marginal artery at the splenic flexure. This corresponds with the site of bleeding seen on the recent tagged red blood cell study. Interestingly, there is [name] second site of apparent extravasation arising from [name] distal visceral branch of the ascending and division of the left colic artery in the proximal descending colon. This extravasation is less robust than the region of extravasation in the marginal artery. [name] renegade ST microcatheter was then advanced over [name] Fathom 16 wire in used to select the left colic artery. [name] left colic arteriogram was performed. The anatomy of the ascending and descending branches was successfully identified. There is persistent contrast extravasation from the marginal artery. No definite contrast extravasation noted on this second angiogram in the region of the descending colon. The catheter was further advanced into the ascending branch of the left colic artery. Arteriography was performed confirming continued abnormal extravasation of contrast from the marginal artery. This has an ovoid appearance and may represent pooling within [name] dependent diverticulum versus fusiform aneurysmal dilatation. The microcatheter was successfully advanced into the marginal artery. Contrast demonstrates frank  extravasation. Coil embolization was performed using [name] series of 2 and 3 mm fibered detachable interlock coils. Post embolization arteriography confirms complete occlusion of this vessel with no further contrast extravasation. No collateral filling. The microcatheter was brought back into the left colic artery and advanced into the descending division of the left colic artery to further evaluate the other potential site of bleeding. Arteriography was performed. No evidence of bleeding in the proximal descending colon. The microcatheter was readvanced into the ascending branch and out into the distal visceral branch that appeared to be the site of bleeding on the initial angiogram. Arteriography was performed and carried out to the venous phase. Again, there is no evidence of bleeding at this site. No definite vessel abnormality was identified. No embolization was performed at this site. The microcatheter and rim catheter were removed. [name] C2 cobra catheter was advanced over [name]  Bentson wire into the abdominal aorta and used to select the celiac artery. [name] celiac arteriogram was performed. Normal arterial anatomy. No evidence of replaced middle colic artery. No active bleeding. The superior mesenteric artery was then catheterized. Arteriography was performed. No evidence of back bleeding from the middle colic artery where it anastomosis with the left colic artery. No additional sites of bleeding were identified. The catheter was removed. [name] limited right common femoral arteriogram was performed. Hemostasis was attained with the assistance of an Angio-Seal device. IMPRESSION: 1. Positive angiography with potentially 2 sites of active bleeding in the region of the splenic flexure and proximal descending colon. 2. Successful coil embolization of the more definitive site of bleeding. 3. The second potential site of bleeding could not be identified on subsequent angiography and may have been artifactual. PLAN: 1. Patient is at  risk for contrast induced nephropathy given baseline stage 3 chronic kidney disease. Recommend trending BUN and creatinine over the next 48 hours. Keep patient well hydrated. 2. Continue to trend H and H and transfuse as needed. 3. If there is evidence of recurrent or persistent active lower GI bleeding, repeat angiography should be considered. Signed, Sterling Big, MD [name] Electronically Signed   By: Malachy Moan M.D.   On: 03/07/2017 09:37   Ir US Guide Vasc Access Right  Result Date: 03/07/2017 INDICATION: 84 year old female with acute lower GI bleed with the site of bleeding localized to the region of the splenic flexure on tagged RBC scan. She presents for visceral angiography and possible embolization. EXAM: IR EMBO ARTERIAL NOT HEMORR HEMANG [name] GUIDE ROADMAPPING; SELECTIVE VISCERAL ARTERIOGRAPHY; ADDITIONAL ARTERIOGRAPHY; IR ULTRASOUND GUIDANCE VASC ACCESS RIGHT 1. Ultrasound-guided vascular access, right common femoral artery 2. Celiac artery catheterization and angiogram (first order) 3. Superior mesenteric artery catheterization and angiogram (first order) 4. Inferior mesenteric artery catheterization and angiogram with additional sub selective arteriography as follows 5. Left colic artery catheterization and angiogram (second order) 6. Ascending branch of the left colic artery catheterization and angiogram (third order) 7. Marginal artery catheterization and angiogram (third order, additional after basic) 8. Coil embolization of marginal artery 9. Catheterization of the descending branch of the left colic artery with angiogram (third order, additional after basic) 10. Catheterization of [name] distal branch of the ascending branch of the left colic artery with angiogram (third order, additional after basic) MEDICATIONS: None. ANESTHESIA/SEDATION: Moderate (conscious) sedation was employed during this procedure. [name] total of Versed  1.5 mg and Fentanyl 50 mcg was administered intravenously. Moderate Sedation Time: 44 minutes. The patient's level of consciousness and vital signs were monitored continuously by radiology nursing throughout the procedure under my direct supervision. CONTRAST:  Approximately 75 mL Isovue 300 FLUOROSCOPY TIME:  Fluoroscopy Time: 13 minutes 24 seconds (907 mGy). COMPLICATIONS: None immediate. PROCEDURE: Informed consent was obtained from the patient following explanation of the procedure, risks, benefits and alternatives. The patient understands, agrees and consents for the procedure. All questions were addressed. [name] time out was performed prior to the initiation of the procedure. Maximal barrier sterile technique utilized including caps, mask, sterile gowns, sterile gloves, large sterile drape, hand hygiene, and Betadine prep. The right common femoral artery was interrogated with ultrasound and found to be widely patent. An image was obtained and stored for the medical record. Local anesthesia was attained by infiltration with 1% lidocaine. [name] small dermatotomy was made. Under real-time sonographic guidance, the vessel was punctured with [name] 21 gauge micropuncture needle. Using standard  technique, the initial micro needle was exchanged over [name] 0.018 micro wire for [name] transitional 4 [place] micro sheath. The micro sheath was then exchanged over [name] 0.035 wire for [name] 5 French vascular sheath. [name] Bentson wire was advanced in the abdominal aorta. [name] rim catheter was advanced over the wire. The rim catheter was used to select the inferior mesenteric artery. An arteriogram was performed. There is early nonvascular extravasation of contrast material arising from what appears to be the marginal artery at the splenic flexure. This corresponds with the site of bleeding seen on the recent tagged red blood cell study. Interestingly, there is [name] second site of apparent extravasation arising from [name] distal visceral branch of the ascending and  division of the left colic artery in the proximal descending colon. This extravasation is less robust than the region of extravasation in the marginal artery. [name] renegade ST microcatheter was then advanced over [name] Fathom 16 wire in used to select the left colic artery. [name] left colic arteriogram was performed. The anatomy of the ascending and descending branches was successfully identified. There is persistent contrast extravasation from the marginal artery. No definite contrast extravasation noted on this second angiogram in the region of the descending colon. The catheter was further advanced into the ascending branch of the left colic artery. Arteriography was performed confirming continued abnormal extravasation of contrast from the marginal artery. This has an ovoid appearance and may represent pooling within [name] dependent diverticulum versus fusiform aneurysmal dilatation. The microcatheter was successfully advanced into the marginal artery. Contrast demonstrates frank extravasation. Coil embolization was performed using [name] series of 2 and 3 mm fibered detachable interlock coils. Post embolization arteriography confirms complete occlusion of this vessel with no further contrast extravasation. No collateral filling. The microcatheter was brought back into the left colic artery and advanced into the descending division of the left colic artery to further evaluate the other potential site of bleeding. Arteriography was performed. No evidence of bleeding in the proximal descending colon. The microcatheter was readvanced into the ascending branch and out into the distal visceral branch that appeared to be the site of bleeding on the initial angiogram. Arteriography was performed and carried out to the venous phase. Again, there is no evidence of bleeding at this site. No definite vessel abnormality was identified. No embolization was performed at this site. The microcatheter and rim catheter were removed. [name] C2 cobra  catheter was advanced over [name] Bentson wire into the abdominal aorta and used to select the celiac artery. [name] celiac arteriogram was performed. Normal arterial anatomy. No evidence of replaced middle colic artery. No active bleeding. The superior mesenteric artery was then catheterized. Arteriography was performed. No evidence of back bleeding from the middle colic artery where it anastomosis with the left colic artery. No additional sites of bleeding were identified. The catheter was removed. [name] limited right common femoral arteriogram was performed. Hemostasis was attained with the assistance of an Angio-Seal device. IMPRESSION: 1. Positive angiography with potentially 2 sites of active bleeding in the region of the splenic flexure and proximal descending colon. 2. Successful coil embolization of the more definitive site of bleeding. 3. The second potential site of bleeding could not be identified on subsequent angiography and may have been artifactual. PLAN: 1. Patient is at risk for contrast induced nephropathy given baseline stage 3 chronic kidney disease. Recommend trending BUN and creatinine over the next 48 hours. Keep patient well hydrated. 2. Continue to trend H and H and transfuse as  needed. 3. If there is evidence of recurrent or persistent active lower GI bleeding, repeat angiography should be considered. Signed, Sterling Big, MD [name] Electronically Signed   By: Malachy Moan M.D.   On: 03/07/2017 09:37   Ir Embo [facility] Hemorr Lymph Michaela Corner  [name] Guide Roadmapping  Result Date: 03/07/2017 INDICATION: 84 year old female with acute lower GI bleed with the site of bleeding localized to the region of the splenic flexure on tagged RBC scan. She presents for visceral angiography and possible embolization. EXAM: IR EMBO ARTERIAL NOT HEMORR HEMANG [name] GUIDE ROADMAPPING; SELECTIVE VISCERAL ARTERIOGRAPHY; ADDITIONAL ARTERIOGRAPHY; IR  ULTRASOUND GUIDANCE VASC ACCESS RIGHT 1. Ultrasound-guided vascular access, right common femoral artery 2. Celiac artery catheterization and angiogram (first order) 3. Superior mesenteric artery catheterization and angiogram (first order) 4. Inferior mesenteric artery catheterization and angiogram with additional sub selective arteriography as follows 5. Left colic artery catheterization and angiogram (second order) 6. Ascending branch of the left colic artery catheterization and angiogram (third order) 7. Marginal artery catheterization and angiogram (third order, additional after basic) 8. Coil embolization of marginal artery 9. Catheterization of the descending branch of the left colic artery with angiogram (third order, additional after basic) 10. Catheterization of [name] distal branch of the ascending branch of the left colic artery with angiogram (third order, additional after basic) MEDICATIONS: None. ANESTHESIA/SEDATION: Moderate (conscious) sedation was employed during this procedure. [name] total of Versed 1.5 mg and Fentanyl 50 mcg was administered intravenously. Moderate Sedation Time: 44 minutes. The patient's level of consciousness and vital signs were monitored continuously by radiology nursing throughout the procedure under my direct supervision. CONTRAST:  Approximately 75 mL Isovue 300 FLUOROSCOPY TIME:  Fluoroscopy Time: 13 minutes 24 seconds (907 mGy). COMPLICATIONS: None immediate. PROCEDURE: Informed consent was obtained from the patient following explanation of the procedure, risks, benefits and alternatives. The patient understands, agrees and consents for the procedure. All questions were addressed. [name] time out was performed prior to the initiation of the procedure. Maximal barrier sterile technique utilized including caps, mask, sterile gowns, sterile gloves, large sterile drape, hand hygiene, and Betadine prep. The right common femoral artery was interrogated with ultrasound and found to be widely  patent. An image was obtained and stored for the medical record. Local anesthesia was attained by infiltration with 1% lidocaine. [name] small dermatotomy was made. Under real-time sonographic guidance, the vessel was punctured with [name] 21 gauge micropuncture needle. Using standard technique, the initial micro needle was exchanged over [name] 0.018 micro wire for [name] transitional 4 [place] micro sheath. The micro sheath was then exchanged over [name] 0.035 wire for [name] 5 French vascular sheath. [name] Bentson wire was advanced in the abdominal aorta. [name] rim catheter was advanced over the wire. The rim catheter was used to select the inferior mesenteric artery. An arteriogram was performed. There is early nonvascular extravasation of contrast material arising from what appears to be the marginal artery at the splenic flexure. This corresponds with the site of bleeding seen on the recent tagged red blood cell study. Interestingly, there is [name] second site of apparent extravasation arising from [name] distal visceral branch of the ascending and division of the left colic artery in the proximal descending colon. This extravasation is less robust than the region of extravasation in the marginal artery. [name] renegade ST microcatheter was then advanced over [name] Fathom 16 wire in used to select the left colic artery. [name] left colic arteriogram was performed. The  anatomy of the ascending and descending branches was successfully identified. There is persistent contrast extravasation from the marginal artery. No definite contrast extravasation noted on this second angiogram in the region of the descending colon. The catheter was further advanced into the ascending branch of the left colic artery. Arteriography was performed confirming continued abnormal extravasation of contrast from the marginal artery. This has an ovoid appearance and may represent pooling within [name] dependent diverticulum versus fusiform aneurysmal dilatation. The microcatheter was successfully  advanced into the marginal artery. Contrast demonstrates frank extravasation. Coil embolization was performed using [name] series of 2 and 3 mm fibered detachable interlock coils. Post embolization arteriography confirms complete occlusion of this vessel with no further contrast extravasation. No collateral filling. The microcatheter was brought back into the left colic artery and advanced into the descending division of the left colic artery to further evaluate the other potential site of bleeding. Arteriography was performed. No evidence of bleeding in the proximal descending colon. The microcatheter was readvanced into the ascending branch and out into the distal visceral branch that appeared to be the site of bleeding on the initial angiogram. Arteriography was performed and carried out to the venous phase. Again, there is no evidence of bleeding at this site. No definite vessel abnormality was identified. No embolization was performed at this site. The microcatheter and rim catheter were removed. [name] C2 cobra catheter was advanced over [name] Bentson wire into the abdominal aorta and used to select the celiac artery. [name] celiac arteriogram was performed. Normal arterial anatomy. No evidence of replaced middle colic artery. No active bleeding. The superior mesenteric artery was then catheterized. Arteriography was performed. No evidence of back bleeding from the middle colic artery where it anastomosis with the left colic artery. No additional sites of bleeding were identified. The catheter was removed. [name] limited right common femoral arteriogram was performed. Hemostasis was attained with the assistance of an Angio-Seal device. IMPRESSION: 1. Positive angiography with potentially 2 sites of active bleeding in the region of the splenic flexure and proximal descending colon. 2. Successful coil embolization of the more definitive site of bleeding. 3. The second potential site of bleeding could not be identified on subsequent  angiography and may have been artifactual. PLAN: 1. Patient is at risk for contrast induced nephropathy given baseline stage 3 chronic kidney disease. Recommend trending BUN and creatinine over the next 48 hours. Keep patient well hydrated. 2. Continue to trend H and H and transfuse as needed. 3. If there is evidence of recurrent or persistent active lower GI bleeding, repeat angiography should be considered. Signed, Sterling Big, MD [facility] Radiology Electronically Signed   By: Malachy Moan M.D.   On: 03/07/2017 09:37    Labs:  CBC: Recent Labs    11/27/16 2132 03/06/17 0839 03/07/17 0310 03/07/17 0745 03/07/17 1911 03/08/17 0236  WBC 4.4 4.5 8.2 8.2  --   --   HGB 10.2* 8.7* 8.6* 8.1* 7.8* 7.6*  HCT 31.4* 26.5* 25.7* 24.4* 23.1* 22.1*  PLT 262 243 147* 143*  --   --     COAGS: No results for input(s): INR, APTT in the last 8760 hours.  BMP: Recent Labs    11/27/16 2132 03/06/17 0839 03/07/17 0745 03/08/17 0236  NA 137 140 141 141  K 4.1 4.0 4.1 3.8  CL 104 106 111 111  CO2 26 24 22 23   GLUCOSE 98 117* 101* 98  BUN 34* 27* 20 17  CALCIUM 9.0 8.9 8.1* 7.8*  CREATININE 1.58* 1.41* 1.22* 1.28*  GFRNONAA 27* 32* 38* 35*  GFRAA 32* 37* 44* 41*    LIVER FUNCTION TESTS: Recent Labs    09/28/16 1128 03/06/17 0839 03/07/17 0745  BILITOT 0.6 0.4 0.6  AST 21 19 17   ALT 10* 9* 7*  ALKPHOS 53 43 36*  PROT 6.7 5.6* 4.6*  ALBUMIN 3.7 3.0* 2.5*    Assessment and Plan:  GI bleed Now post embolization in IR 1/23 Doing well; no bleeding  Cr 1.22- 1.24 post procedure  Electronically Signed: Davin Muramoto [name], [name] 03/08/2017, 7:36 AM   I spent [name] total of 15 Minutes at the the patient's bedside AND on the patient's [name] floor or unit, greater than 50% of which was counseling/coordinating care for splenic flexure bleeding source embolization

## 2017-03-08 NOTE — Sedation Documentation (Signed)
Patient denies pain and is resting comfortably.  

## 2017-03-08 NOTE — Progress Notes (Signed)
Patient transferred to IR for procedure. Blood still running. Will end up finishing up ENDO RN there aware.

## 2017-03-08 NOTE — Sedation Documentation (Signed)
Pedal pulse +1 bilateral

## 2017-03-08 NOTE — Plan of Care (Signed)
Patient is doing extremely well; no s/sx of bleeding this shift. VSS.  Will continue to monitor closely.  Patient has been safe and free of injury thus far; will continue safety interventions.

## 2017-03-08 NOTE — Progress Notes (Signed)
PT Cancellation Note  Patient Details Name: Brenda GladeBertha C Buitron MRN: [id number] DOB: 05/04/1923   Cancelled Treatment:    Reason Eval/Treat Not Completed: Patient not medically ready. Pt with active bleeding; to IR for procedure. Please re-order PT when medically appropriate.  Ina HomesJaclyn Jobe Mutch, PT, DPT Acute Rehab Services  Pager: [phone]  Malachy ChamberJaclyn L Quatisha Zylka 03/08/2017, 3:06 PM

## 2017-03-09 LAB — HEMOGLOBIN AND HEMATOCRIT, BLOOD
HCT: 20.9 % — ABNORMAL LOW (ref 36.0–46.0)
HCT: 26.9 % — ABNORMAL LOW (ref 36.0–46.0)
Hemoglobin: 7.1 g/dL — ABNORMAL LOW (ref 12.0–15.0)
Hemoglobin: 9 g/dL — ABNORMAL LOW (ref 12.0–15.0)

## 2017-03-09 LAB — COMPREHENSIVE METABOLIC PANEL
ALT: 9 U/L — ABNORMAL LOW (ref 14–54)
AST: 15 U/L (ref 15–41)
Albumin: 2.3 g/dL — ABNORMAL LOW (ref 3.5–5.0)
Alkaline Phosphatase: 34 U/L — ABNORMAL LOW (ref 38–126)
Anion gap: 8 (ref 5–15)
BUN: 15 mg/dL (ref 6–20)
CO2: 19 mmol/L — ABNORMAL LOW (ref 22–32)
Calcium: 7.6 mg/dL — ABNORMAL LOW (ref 8.9–10.3)
Chloride: 111 mmol/L (ref 101–111)
Creatinine, Ser: 1.13 mg/dL — ABNORMAL HIGH (ref 0.44–1.00)
GFR calc Af Amer: 48 mL/min — ABNORMAL LOW (ref >60)
GFR calc non Af Amer: 41 mL/min — ABNORMAL LOW (ref >60)
Glucose, Bld: 92 mg/dL (ref 65–99)
Potassium: 3.8 mmol/L (ref 3.5–5.1)
Sodium: 138 mmol/L (ref 135–145)
Total Bilirubin: 0.6 mg/dL (ref 0.3–1.2)
Total Protein: 4.2 g/dL — ABNORMAL LOW (ref 6.5–8.1)

## 2017-03-09 LAB — CBC
HCT: 28.7 % — ABNORMAL LOW (ref 36.0–46.0)
Hemoglobin: 9.5 g/dL — ABNORMAL LOW (ref 12.0–15.0)
MCH: 29 pg (ref 26.0–34.0)
MCHC: 33.1 g/dL (ref 30.0–36.0)
MCV: 87.5 fL (ref 78.0–100.0)
Platelets: 149 10*3/uL — ABNORMAL LOW (ref 150–400)
RBC: 3.28 MIL/uL — ABNORMAL LOW (ref 3.87–5.11)
RDW: 16.6 % — ABNORMAL HIGH (ref 11.5–15.5)
WBC: 6.3 10*3/uL (ref 4.0–10.5)

## 2017-03-09 LAB — PREPARE RBC (CROSSMATCH)

## 2017-03-09 LAB — GLUCOSE, CAPILLARY: Glucose-Capillary: 75 mg/dL (ref 65–99)

## 2017-03-09 MED ORDER — SODIUM CHLORIDE 0.9 % IV SOLN: Freq: Once | INTRAVENOUS | Status: DC

## 2017-03-09 NOTE — Progress Notes (Signed)
Bleeding seems to have slowed down, and hgb has come up after further Tx.    Last BM was about 4 hrs ago and, per pt it looked "less bloody and nurse said there was no blood in it."  Pt has known severe diverticulosis and I don't think there's any reason to suspect an alternative etiology.  At this time, I would favor observation and supportive care as is being done; at some point, if the transfusion requirement became excessive and there was no evidence of the bleeding slowing down, you could discuss surgery with the patient although she would probably need a subtotal colectomy which would be a big hurdle for her to recover from at her age.  I will sign off, as I don't think there is anything for us to add to pt's care, BUT don't hesitate to call us back if you feel that further input from us would be beneficial, for example, if you are having trouble deciding about management options.  Florencia Reasonsobert V. Aireana Ryland, M.D. Pager [phone] If no answer or after 5 PM call [phone]

## 2017-03-09 NOTE — Progress Notes (Signed)
Patient continues to have bright red blood with little stool from rectum.

## 2017-03-09 NOTE — Progress Notes (Signed)
MD was notified patient c/o of heaviness of the right leg and tingling. Right groin level zero no complications or hematoma and pedal pulse present with extremity warm to touch. No new orders received and the plan is to continue to monitor.

## 2017-03-09 NOTE — Progress Notes (Signed)
Pt still having bloody stools, FYI message sent to primary team.

## 2017-03-09 NOTE — Progress Notes (Signed)
Chief Complaint: Patient was seen today for follow up angiogram/ebolization for GI beed   Supervising Physician: Dr. Loreta Ave  Patient Status: [facility] - In-pt  Subjective: S/p repeat Visceral angiogram with coil embolization of 2nd bleeding left colic branch.  Resting in bed. No c/o pain. Still passing blood in BMs  Objective: Physical Exam: BP (!) 148/101   Pulse 65   Temp 98 F (36.7 C) (Oral)   Resp 16   Ht 5\' 3"  (1.6 m)   Wt 204 lb 12.9 oz (92.9 kg)   SpO2 99%   BMI 36.28 kg/m  Abd: soft, NT. Ext: (R)groin soft, NT, no hematoma, legs/feet warm.   Current Facility-Administered Medications:  .  0.9 %  sodium chloride infusion, , Intravenous, Continuous, Russella Dar, NP, Last Rate: 75 mL/hr at 03/07/17 1529 .  0.9 %  sodium chloride infusion, , Intravenous, Once, Hazeline Junker B, MD .  0.9 %  sodium chloride infusion, , Intravenous, Once, Tyrone Nine, MD .  acetaminophen (TYLENOL) tablet 650 mg, 650 mg, Oral, Q6H PRN, 650 mg at 03/09/17 0258 **OR** acetaminophen (TYLENOL) suppository 650 mg, 650 mg, Rectal, Q6H PRN, Russella Dar, NP .  ondansetron (ZOFRAN) tablet 4 mg, 4 mg, Oral, Q6H PRN **OR** ondansetron (ZOFRAN) injection 4 mg, 4 mg, Intravenous, Q6H PRN, Russella Dar, NP  Labs: CBC Recent Labs    03/07/17 0310 03/07/17 0745  03/08/17 1713 03/09/17 0613  WBC 8.2 8.2  --   --   --   HGB 8.6* 8.1*   < > 9.0* 7.1*  HCT 25.7* 24.4*   < > 27.5* 20.9*  PLT 147* 143*  --   --   --    < > = values in this interval not displayed.   BMET Recent Labs    03/08/17 0236 03/09/17 0613  NA 141 138  K 3.8 3.8  CL 111 111  CO2 23 19*  GLUCOSE 98 92  BUN 17 15  CREATININE 1.28* 1.13*  CALCIUM 7.8* 7.6*   LFT Recent Labs    03/09/17 0613  PROT 4.2*  ALBUMIN 2.3*  AST 15  ALT 9*  ALKPHOS 34*  BILITOT 0.6   PT/INR No results for input(s): LABPROT, INR in the last 72 hours.   Studies/Results: Ir Angiogram Visceral Selective  Result Date:  03/08/2017 INDICATION: Lower GI bleed. Vascular lesion embolized 03/06/2017, with suspicion of intermittent bleeding from additional site. Clinically, patient has had continued lower GI bleeding requiring additional transfusion. EXAM: MESENTERIC ARTERIOGRAM 2 VESSEL ARTERIAL EMBOLIZATION  THIRD ORDER ULTRASOUND GUIDANCE FOR ARTERIAL ACCESS MEDICATIONS: No antibiotics indicated ANESTHESIA/SEDATION: Intravenous Fentanyl and Versed were administered as conscious sedation during continuous monitoring of the patient's level of consciousness and physiological / cardiorespiratory status by the radiology RN, with a total moderate sedation time of 47 minutes. CONTRAST:  Isovue-300 FLUOROSCOPY TIME:  6 minutes, 48 second, 363 mGy COMPLICATIONS: None immediate. PROCEDURE: Informed consent was obtained from the patient following explanation of the procedure, risks, benefits and alternatives. The patient understands, agrees and consents for the procedure. All questions were addressed. A time out was performed prior to the initiation of the procedure. Maximal barrier sterile technique utilized including caps, mask, sterile gowns, sterile gloves, large sterile drape, hand hygiene, and Betadine prep. Right femoral region prepped and draped in usual sterile fashion. Maximal barrier sterile technique was utilized including caps, mask, sterile gowns, sterile gloves, sterile drape, hand hygiene and skin antiseptic. The right common femoral artery was localized  under ultrasound. Under real-time ultrasound guidance, the vessel was accessed with a 21-gauge micropuncture needle, exchanged over a 018 guidewire for a transitional dilator, through which a 035 guidewire was advanced. Over this, a 5 [place] vascular sheath was placed, through which a 5 [place] RIM catheter was advanced and used to selectively catheterize the inferior mesenteric artery for selective arteriography in multiple projections. The previously embolized segment showed  continued closure. No active extravasation was identified on non selective and sub selective contrast injection. No additional vascular lesion identified. Normal venous phase. Empiric embolization of the third order branch supplying the bleeding region seen previously was performed using a single 2 mm x 4 cm interlock coil. Follow-up confirmatory arteriogram was obtained. The RIM catheter was then exchanged for a C2, utilized to selectively catheterize the superior mesenteric artery for selective arteriography in multiple projections. No active extravasation or vascular lesion was identified. Venous phase normal. The catheter and sheath were removed and hemostasis achieved with the aid of the Exoseal device after confirmatory femoral arteriography. The patient tolerated the procedure well. IMPRESSION: 1. Technically successful coil embolization of the inferior mesenteric artery third order branch supplying region of active extravasation seen on previous examination. 2. No additional areas of active extravasation or vascular lesion on selective superior and inferior mesenteric arteriography. Electronically Signed   By: Corlis Leak  Hassell M.D.   On: 03/08/2017 16:31   Ir Angiogram Visceral Selective  Result Date: 03/08/2017 INDICATION: Lower GI bleed. Vascular lesion embolized 03/06/2017, with suspicion of intermittent bleeding from additional site. Clinically, patient has had continued lower GI bleeding requiring additional transfusion. EXAM: MESENTERIC ARTERIOGRAM 2 VESSEL ARTERIAL EMBOLIZATION  THIRD ORDER ULTRASOUND GUIDANCE FOR ARTERIAL ACCESS MEDICATIONS: No antibiotics indicated ANESTHESIA/SEDATION: Intravenous Fentanyl and Versed were administered as conscious sedation during continuous monitoring of the patient's level of consciousness and physiological / cardiorespiratory status by the radiology RN, with a total moderate sedation time of 47 minutes. CONTRAST:  Isovue-300 FLUOROSCOPY TIME:  6 minutes, 48 second,  363 mGy COMPLICATIONS: None immediate. PROCEDURE: Informed consent was obtained from the patient following explanation of the procedure, risks, benefits and alternatives. The patient understands, agrees and consents for the procedure. All questions were addressed. A time out was performed prior to the initiation of the procedure. Maximal barrier sterile technique utilized including caps, mask, sterile gowns, sterile gloves, large sterile drape, hand hygiene, and Betadine prep. Right femoral region prepped and draped in usual sterile fashion. Maximal barrier sterile technique was utilized including caps, mask, sterile gowns, sterile gloves, sterile drape, hand hygiene and skin antiseptic. The right common femoral artery was localized under ultrasound. Under real-time ultrasound guidance, the vessel was accessed with a 21-gauge micropuncture needle, exchanged over a 018 guidewire for a transitional dilator, through which a 035 guidewire was advanced. Over this, a 5 [place] vascular sheath was placed, through which a 5 [place] RIM catheter was advanced and used to selectively catheterize the inferior mesenteric artery for selective arteriography in multiple projections. The previously embolized segment showed continued closure. No active extravasation was identified on non selective and sub selective contrast injection. No additional vascular lesion identified. Normal venous phase. Empiric embolization of the third order branch supplying the bleeding region seen previously was performed using a single 2 mm x 4 cm interlock coil. Follow-up confirmatory arteriogram was obtained. The RIM catheter was then exchanged for a C2, utilized to selectively catheterize the superior mesenteric artery for selective arteriography in multiple projections. No active extravasation or vascular lesion was identified. Venous phase  normal. The catheter and sheath were removed and hemostasis achieved with the aid of the Exoseal device after  confirmatory femoral arteriography. The patient tolerated the procedure well. IMPRESSION: 1. Technically successful coil embolization of the inferior mesenteric artery third order branch supplying region of active extravasation seen on previous examination. 2. No additional areas of active extravasation or vascular lesion on selective superior and inferior mesenteric arteriography. Electronically Signed   By: Corlis Leak M.D.   On: 03/08/2017 16:31   Ir Angiogram Selective Each Additional Vessel  Result Date: 03/08/2017 INDICATION: Lower GI bleed. Vascular lesion embolized 03/06/2017, with suspicion of intermittent bleeding from additional site. Clinically, patient has had continued lower GI bleeding requiring additional transfusion. EXAM: MESENTERIC ARTERIOGRAM 2 VESSEL ARTERIAL EMBOLIZATION  THIRD ORDER ULTRASOUND GUIDANCE FOR ARTERIAL ACCESS MEDICATIONS: No antibiotics indicated ANESTHESIA/SEDATION: Intravenous Fentanyl and Versed were administered as conscious sedation during continuous monitoring of the patient's level of consciousness and physiological / cardiorespiratory status by the radiology RN, with a total moderate sedation time of 47 minutes. CONTRAST:  Isovue-300 FLUOROSCOPY TIME:  6 minutes, 48 second, 363 mGy COMPLICATIONS: None immediate. PROCEDURE: Informed consent was obtained from the patient following explanation of the procedure, risks, benefits and alternatives. The patient understands, agrees and consents for the procedure. All questions were addressed. A time out was performed prior to the initiation of the procedure. Maximal barrier sterile technique utilized including caps, mask, sterile gowns, sterile gloves, large sterile drape, hand hygiene, and Betadine prep. Right femoral region prepped and draped in usual sterile fashion. Maximal barrier sterile technique was utilized including caps, mask, sterile gowns, sterile gloves, sterile drape, hand hygiene and skin antiseptic. The right  common femoral artery was localized under ultrasound. Under real-time ultrasound guidance, the vessel was accessed with a 21-gauge micropuncture needle, exchanged over a 018 guidewire for a transitional dilator, through which a 035 guidewire was advanced. Over this, a 5 [place] vascular sheath was placed, through which a 5 [place] RIM catheter was advanced and used to selectively catheterize the inferior mesenteric artery for selective arteriography in multiple projections. The previously embolized segment showed continued closure. No active extravasation was identified on non selective and sub selective contrast injection. No additional vascular lesion identified. Normal venous phase. Empiric embolization of the third order branch supplying the bleeding region seen previously was performed using a single 2 mm x 4 cm interlock coil. Follow-up confirmatory arteriogram was obtained. The RIM catheter was then exchanged for a C2, utilized to selectively catheterize the superior mesenteric artery for selective arteriography in multiple projections. No active extravasation or vascular lesion was identified. Venous phase normal. The catheter and sheath were removed and hemostasis achieved with the aid of the Exoseal device after confirmatory femoral arteriography. The patient tolerated the procedure well. IMPRESSION: 1. Technically successful coil embolization of the inferior mesenteric artery third order branch supplying region of active extravasation seen on previous examination. 2. No additional areas of active extravasation or vascular lesion on selective superior and inferior mesenteric arteriography. Electronically Signed   By: Corlis Leak M.D.   On: 03/08/2017 16:31   Ir US Guide Vasc Access Right  Result Date: 03/08/2017 INDICATION: Lower GI bleed. Vascular lesion embolized 03/06/2017, with suspicion of intermittent bleeding from additional site. Clinically, patient has had continued lower GI bleeding requiring  additional transfusion. EXAM: MESENTERIC ARTERIOGRAM 2 VESSEL ARTERIAL EMBOLIZATION  THIRD ORDER ULTRASOUND GUIDANCE FOR ARTERIAL ACCESS MEDICATIONS: No antibiotics indicated ANESTHESIA/SEDATION: Intravenous Fentanyl and Versed were administered as conscious sedation during continuous monitoring  of the patient's level of consciousness and physiological / cardiorespiratory status by the radiology RN, with a total moderate sedation time of 47 minutes. CONTRAST:  Isovue-300 FLUOROSCOPY TIME:  6 minutes, 48 second, 363 mGy COMPLICATIONS: None immediate. PROCEDURE: Informed consent was obtained from the patient following explanation of the procedure, risks, benefits and alternatives. The patient understands, agrees and consents for the procedure. All questions were addressed. A time out was performed prior to the initiation of the procedure. Maximal barrier sterile technique utilized including caps, mask, sterile gowns, sterile gloves, large sterile drape, hand hygiene, and Betadine prep. Right femoral region prepped and draped in usual sterile fashion. Maximal barrier sterile technique was utilized including caps, mask, sterile gowns, sterile gloves, sterile drape, hand hygiene and skin antiseptic. The right common femoral artery was localized under ultrasound. Under real-time ultrasound guidance, the vessel was accessed with a 21-gauge micropuncture needle, exchanged over a 018 guidewire for a transitional dilator, through which a 035 guidewire was advanced. Over this, a 5 [place] vascular sheath was placed, through which a 5 [place] RIM catheter was advanced and used to selectively catheterize the inferior mesenteric artery for selective arteriography in multiple projections. The previously embolized segment showed continued closure. No active extravasation was identified on non selective and sub selective contrast injection. No additional vascular lesion identified. Normal venous phase. Empiric embolization of the  third order branch supplying the bleeding region seen previously was performed using a single 2 mm x 4 cm interlock coil. Follow-up confirmatory arteriogram was obtained. The RIM catheter was then exchanged for a C2, utilized to selectively catheterize the superior mesenteric artery for selective arteriography in multiple projections. No active extravasation or vascular lesion was identified. Venous phase normal. The catheter and sheath were removed and hemostasis achieved with the aid of the Exoseal device after confirmatory femoral arteriography. The patient tolerated the procedure well. IMPRESSION: 1. Technically successful coil embolization of the inferior mesenteric artery third order branch supplying region of active extravasation seen on previous examination. 2. No additional areas of active extravasation or vascular lesion on selective superior and inferior mesenteric arteriography. Electronically Signed   By: Corlis Leak M.D.   On: 03/08/2017 16:31   Ir Embo Art  Peter [facility] Hemorr Lymph [employer] Guide Roadmapping  Result Date: 03/08/2017 INDICATION: Lower GI bleed. Vascular lesion embolized 03/06/2017, with suspicion of intermittent bleeding from additional site. Clinically, patient has had continued lower GI bleeding requiring additional transfusion. EXAM: MESENTERIC ARTERIOGRAM 2 VESSEL ARTERIAL EMBOLIZATION  THIRD ORDER ULTRASOUND GUIDANCE FOR ARTERIAL ACCESS MEDICATIONS: No antibiotics indicated ANESTHESIA/SEDATION: Intravenous Fentanyl and Versed were administered as conscious sedation during continuous monitoring of the patient's level of consciousness and physiological / cardiorespiratory status by the radiology RN, with a total moderate sedation time of 47 minutes. CONTRAST:  Isovue-300 FLUOROSCOPY TIME:  6 minutes, 48 second, 363 mGy COMPLICATIONS: None immediate. PROCEDURE: Informed consent was obtained from the patient following explanation of the procedure, risks, benefits and alternatives. The  patient understands, agrees and consents for the procedure. All questions were addressed. A time out was performed prior to the initiation of the procedure. Maximal barrier sterile technique utilized including caps, mask, sterile gowns, sterile gloves, large sterile drape, hand hygiene, and Betadine prep. Right femoral region prepped and draped in usual sterile fashion. Maximal barrier sterile technique was utilized including caps, mask, sterile gowns, sterile gloves, sterile drape, hand hygiene and skin antiseptic. The right common femoral artery was localized under ultrasound. Under real-time ultrasound guidance, the vessel was  accessed with a 21-gauge micropuncture needle, exchanged over a 018 guidewire for a transitional dilator, through which a 035 guidewire was advanced. Over this, a 5 [place] vascular sheath was placed, through which a 5 [place] RIM catheter was advanced and used to selectively catheterize the inferior mesenteric artery for selective arteriography in multiple projections. The previously embolized segment showed continued closure. No active extravasation was identified on non selective and sub selective contrast injection. No additional vascular lesion identified. Normal venous phase. Empiric embolization of the third order branch supplying the bleeding region seen previously was performed using a single 2 mm x 4 cm interlock coil. Follow-up confirmatory arteriogram was obtained. The RIM catheter was then exchanged for a C2, utilized to selectively catheterize the superior mesenteric artery for selective arteriography in multiple projections. No active extravasation or vascular lesion was identified. Venous phase normal. The catheter and sheath were removed and hemostasis achieved with the aid of the Exoseal device after confirmatory femoral arteriography. The patient tolerated the procedure well. IMPRESSION: 1. Technically successful coil embolization of the inferior mesenteric artery third  order branch supplying region of active extravasation seen on previous examination. 2. No additional areas of active extravasation or vascular lesion on selective superior and inferior mesenteric arteriography. Electronically Signed   By: Corlis Leak M.D.   On: 03/08/2017 16:31    Assessment/Plan: Lower GI bleed S/p visceral angiogram with coil embolization of bleeding branch of the left colic artery 1/23 Now s/p repeat angio and embolization IMA branch at second site of extravasation seen previously No additional bleeding sites seen on last angio Hgb now 7.1 and pt still having bloody BMs. BP and HR stable Renal function stable. D/w Dr. Loreta Ave, would recommend repeat NM Tagged RBC study to reassess for potential 3rd site before considering another angio     LOS: 3 days   I spent a total of 15 minutes in face to face in clinical consultation, greater than 50% of which was counseling/coordinating care for visceral angio with embo for lower GI bleed  Brayton El PA-C 03/09/2017 11:41 AM

## 2017-03-09 NOTE — Progress Notes (Signed)
PROGRESS NOTE  [name]  ZOX:[id number] DOB: 04-05-1923 DOA: 03/06/2017 PCP: Fleet Contras, MD   Brief Narrative: [name] is a 84 y.o. female with a history of diverticulosis with recurrent GI bleeding, aortic stenosis, HTN on ASA who presented for lower abdominal cramping and blood in stool. Hgb noted to be 8.7 (previously ~10), 2u PRBCs provided and bleeding scan was performed showing bleed at splenic flexure. Subsequent angiogram with coil embolization of actively bleeding branch of the left colic artery was performed in IR 1/23 and repeated with a different branch 1/25. 1u PRBCs given 1/25 and again 1/26. Bleeding persists, so repeat tagged RBC scan is requested.   Assessment & Plan: Principal Problem:   Acute lower GI bleeding Active Problems:   Diverticula, colon   HTN (hypertension)   High cholesterol   Acute kidney injury (HCC)   Acute blood loss anemia  Diverticulosis with acute lower GI bleeding and acute blood loss anemia on chronic iron-deficiency anemia: Severe diverticulosis previously limited colonoscopies. - Underwent coil embolization of left colic artery 1/23 and repeat angio with embolization of another source of bleeding 1/25. GI bleeding persists, so will continue transfusing and recheck NM tagged scan.  - Continue NPO.  - CBC after transfusion this PM. Hypotensive this AM.  - GI and IR following. Feel repeat angio not likely to be positive.   Thrombocytopenia: Mild.  - Holding ASA, will continue to monitor  AKI on stage III CKD: Cr improved with fluids, transfusions to baseline ~1.2.  - Continue daily monitoring, though fortunately appears to be stable.    HTN:  - Holding metoprolol, losartan/HCTZ  Hypercholesterolemia: Intolerant to multiple medications.   DVT prophylaxis: SCDs Code Status: Full Family Communication: None at bedside Disposition Plan: Continue close monitoring in SDU.   Consultants:   GI  IR  Procedures:  - Visceral  angiogram with coil embolization of bleeding branch of the left colic artery 03/06/2017.  - Mesenteric angio (2-vessel) with embolization IMA branch site of extravasation seen previously 03/08/2017.  Antimicrobials:  None  Subjective: Ongoing melanotic/maroon stools, 2 so far this morning. Feeling lightheaded when getting up but no other complaints.   Objective: Vitals:   03/09/17 0805 03/09/17 0820 03/09/17 1045 03/09/17 1235  BP:  (!) 106/53 (!) 148/101   Pulse: 65     Resp:   16 17  Temp: 97.9 F (36.6 C) 98 F (36.7 C) 98 F (36.7 C) 97.9 F (36.6 C)  TempSrc: Oral Oral Oral Oral  SpO2:   99%   Weight:      Height:        Intake/Output Summary (Last 24 hours) at 03/09/2017 1538 Last data filed at 03/09/2017 1045 Gross per 24 hour  Intake 315 ml  Output 102 ml  Net 213 ml   Filed Weights   03/07/17 0314 03/08/17 0546 03/09/17 0550  Weight: 91 kg (200 lb 9.9 oz) 92.2 kg (203 lb 3.2 oz) 92.9 kg (204 lb 12.9 oz)    Gen: Pleasant, elderly female in no distress Pulm: Non-labored breathing room air. Clear to auscultation bilaterally.  CV: Regular rate and rhythm. No murmur, rub, or gallop. No JVD, trace pedal edema. GI: Abdomen soft, no tenderness, non-distended, with normoactive bowel sounds. No organomegaly or masses felt. Ext: Warm, no deformities Skin: No rashes, lesions no ulcers Neuro: Alert and oriented. No focal neurological deficits. Psych: Judgement and insight appear normal. Mood & affect appropriate.   Data Reviewed: I have personally reviewed following labs  and imaging studies  CBC: Recent Labs  Lab 03/06/17 0839 03/07/17 0310 03/07/17 0745 03/07/17 1911 03/08/17 0236 03/08/17 1713 03/09/17 0613 03/09/17 1209  WBC 4.5 8.2 8.2  --   --   --   --   --   NEUTROABS 3.0  --   --   --   --   --   --   --   HGB 8.7* 8.6* 8.1* 7.8* 7.6* 9.0* 7.1* 9.0*  HCT 26.5* 25.7* 24.4* 23.1* 22.1* 27.5* 20.9* 26.9*  MCV 92.7 91.1 89.4  --   --   --   --   --   PLT  243 147* 143*  --   --   --   --   --    Basic Metabolic Panel: Recent Labs  Lab 03/06/17 0839 03/07/17 0745 03/08/17 0236 03/09/17 0613  NA 140 141 141 138  K 4.0 4.1 3.8 3.8  CL 106 111 111 111  CO2 24 22 23  19*  GLUCOSE 117* 101* 98 92  BUN 27* 20 17 15   CREATININE 1.41* 1.22* 1.28* 1.13*  CALCIUM 8.9 8.1* 7.8* 7.6*   GFR: Estimated Creatinine Clearance: 35.1 mL/min (A) (by C-G formula based on SCr of 1.13 mg/dL (H)).  Radiology Studies: Ir Angiogram Visceral Selective  Result Date: 03/08/2017 INDICATION: Lower GI bleed. Vascular lesion embolized 03/06/2017, with suspicion of intermittent bleeding from additional site. Clinically, patient has had continued lower GI bleeding requiring additional transfusion. EXAM: MESENTERIC ARTERIOGRAM 2 VESSEL ARTERIAL EMBOLIZATION  THIRD ORDER ULTRASOUND GUIDANCE FOR ARTERIAL ACCESS MEDICATIONS: No antibiotics indicated ANESTHESIA/SEDATION: Intravenous Fentanyl and Versed were administered as conscious sedation during continuous monitoring of the patient's level of consciousness and physiological / cardiorespiratory status by the radiology RN, with a total moderate sedation time of 47 minutes. CONTRAST:  Isovue-300 FLUOROSCOPY TIME:  6 minutes, 48 second, 363 mGy COMPLICATIONS: None immediate. PROCEDURE: Informed consent was obtained from the patient following explanation of the procedure, risks, benefits and alternatives. The patient understands, agrees and consents for the procedure. All questions were addressed. A time out was performed prior to the initiation of the procedure. Maximal barrier sterile technique utilized including caps, mask, sterile gowns, sterile gloves, large sterile drape, hand hygiene, and Betadine prep. Right femoral region prepped and draped in usual sterile fashion. Maximal barrier sterile technique was utilized including caps, mask, sterile gowns, sterile gloves, sterile drape, hand hygiene and skin antiseptic. The right  common femoral artery was localized under ultrasound. Under real-time ultrasound guidance, the vessel was accessed with a 21-gauge micropuncture needle, exchanged over a 018 guidewire for a transitional dilator, through which a 035 guidewire was advanced. Over this, a 5 [place] vascular sheath was placed, through which a 5 [place] RIM catheter was advanced and used to selectively catheterize the inferior mesenteric artery for selective arteriography in multiple projections. The previously embolized segment showed continued closure. No active extravasation was identified on non selective and sub selective contrast injection. No additional vascular lesion identified. Normal venous phase. Empiric embolization of the third order branch supplying the bleeding region seen previously was performed using a single 2 mm x 4 cm interlock coil. Follow-up confirmatory arteriogram was obtained. The RIM catheter was then exchanged for a C2, utilized to selectively catheterize the superior mesenteric artery for selective arteriography in multiple projections. No active extravasation or vascular lesion was identified. Venous phase normal. The catheter and sheath were removed and hemostasis achieved with the aid of the Exoseal device after confirmatory femoral arteriography. The  patient tolerated the procedure well. IMPRESSION: 1. Technically successful coil embolization of the inferior mesenteric artery third order branch supplying region of active extravasation seen on previous examination. 2. No additional areas of active extravasation or vascular lesion on selective superior and inferior mesenteric arteriography. Electronically Signed   By: Corlis Leak M.D.   On: 03/08/2017 16:31   Ir Angiogram Visceral Selective  Result Date: 03/08/2017 INDICATION: Lower GI bleed. Vascular lesion embolized 03/06/2017, with suspicion of intermittent bleeding from additional site. Clinically, patient has had continued lower GI bleeding requiring  additional transfusion. EXAM: MESENTERIC ARTERIOGRAM 2 VESSEL ARTERIAL EMBOLIZATION  THIRD ORDER ULTRASOUND GUIDANCE FOR ARTERIAL ACCESS MEDICATIONS: No antibiotics indicated ANESTHESIA/SEDATION: Intravenous Fentanyl and Versed were administered as conscious sedation during continuous monitoring of the patient's level of consciousness and physiological / cardiorespiratory status by the radiology RN, with a total moderate sedation time of 47 minutes. CONTRAST:  Isovue-300 FLUOROSCOPY TIME:  6 minutes, 48 second, 363 mGy COMPLICATIONS: None immediate. PROCEDURE: Informed consent was obtained from the patient following explanation of the procedure, risks, benefits and alternatives. The patient understands, agrees and consents for the procedure. All questions were addressed. A time out was performed prior to the initiation of the procedure. Maximal barrier sterile technique utilized including caps, mask, sterile gowns, sterile gloves, large sterile drape, hand hygiene, and Betadine prep. Right femoral region prepped and draped in usual sterile fashion. Maximal barrier sterile technique was utilized including caps, mask, sterile gowns, sterile gloves, sterile drape, hand hygiene and skin antiseptic. The right common femoral artery was localized under ultrasound. Under real-time ultrasound guidance, the vessel was accessed with a 21-gauge micropuncture needle, exchanged over a 018 guidewire for a transitional dilator, through which a 035 guidewire was advanced. Over this, a 5 [place] vascular sheath was placed, through which a 5 [place] RIM catheter was advanced and used to selectively catheterize the inferior mesenteric artery for selective arteriography in multiple projections. The previously embolized segment showed continued closure. No active extravasation was identified on non selective and sub selective contrast injection. No additional vascular lesion identified. Normal venous phase. Empiric embolization of the  third order branch supplying the bleeding region seen previously was performed using a single 2 mm x 4 cm interlock coil. Follow-up confirmatory arteriogram was obtained. The RIM catheter was then exchanged for a C2, utilized to selectively catheterize the superior mesenteric artery for selective arteriography in multiple projections. No active extravasation or vascular lesion was identified. Venous phase normal. The catheter and sheath were removed and hemostasis achieved with the aid of the Exoseal device after confirmatory femoral arteriography. The patient tolerated the procedure well. IMPRESSION: 1. Technically successful coil embolization of the inferior mesenteric artery third order branch supplying region of active extravasation seen on previous examination. 2. No additional areas of active extravasation or vascular lesion on selective superior and inferior mesenteric arteriography. Electronically Signed   By: Corlis Leak M.D.   On: 03/08/2017 16:31   Ir Angiogram Selective Each Additional Vessel  Result Date: 03/08/2017 INDICATION: Lower GI bleed. Vascular lesion embolized 03/06/2017, with suspicion of intermittent bleeding from additional site. Clinically, patient has had continued lower GI bleeding requiring additional transfusion. EXAM: MESENTERIC ARTERIOGRAM 2 VESSEL ARTERIAL EMBOLIZATION  THIRD ORDER ULTRASOUND GUIDANCE FOR ARTERIAL ACCESS MEDICATIONS: No antibiotics indicated ANESTHESIA/SEDATION: Intravenous Fentanyl and Versed were administered as conscious sedation during continuous monitoring of the patient's level of consciousness and physiological / cardiorespiratory status by the radiology RN, with a total moderate sedation time of 47 minutes. CONTRAST:  Isovue-300 FLUOROSCOPY TIME:  6 minutes, 48 second, 363 mGy COMPLICATIONS: None immediate. PROCEDURE: Informed consent was obtained from the patient following explanation of the procedure, risks, benefits and alternatives. The patient  understands, agrees and consents for the procedure. All questions were addressed. A time out was performed prior to the initiation of the procedure. Maximal barrier sterile technique utilized including caps, mask, sterile gowns, sterile gloves, large sterile drape, hand hygiene, and Betadine prep. Right femoral region prepped and draped in usual sterile fashion. Maximal barrier sterile technique was utilized including caps, mask, sterile gowns, sterile gloves, sterile drape, hand hygiene and skin antiseptic. The right common femoral artery was localized under ultrasound. Under real-time ultrasound guidance, the vessel was accessed with a 21-gauge micropuncture needle, exchanged over a 018 guidewire for a transitional dilator, through which a 035 guidewire was advanced. Over this, a 5 [place] vascular sheath was placed, through which a 5 [place] RIM catheter was advanced and used to selectively catheterize the inferior mesenteric artery for selective arteriography in multiple projections. The previously embolized segment showed continued closure. No active extravasation was identified on non selective and sub selective contrast injection. No additional vascular lesion identified. Normal venous phase. Empiric embolization of the third order branch supplying the bleeding region seen previously was performed using a single 2 mm x 4 cm interlock coil. Follow-up confirmatory arteriogram was obtained. The RIM catheter was then exchanged for a C2, utilized to selectively catheterize the superior mesenteric artery for selective arteriography in multiple projections. No active extravasation or vascular lesion was identified. Venous phase normal. The catheter and sheath were removed and hemostasis achieved with the aid of the Exoseal device after confirmatory femoral arteriography. The patient tolerated the procedure well. IMPRESSION: 1. Technically successful coil embolization of the inferior mesenteric artery third order  branch supplying region of active extravasation seen on previous examination. 2. No additional areas of active extravasation or vascular lesion on selective superior and inferior mesenteric arteriography. Electronically Signed   By: Corlis Leak M.D.   On: 03/08/2017 16:31   Ir US Guide Vasc Access Right  Result Date: 03/08/2017 INDICATION: Lower GI bleed. Vascular lesion embolized 03/06/2017, with suspicion of intermittent bleeding from additional site. Clinically, patient has had continued lower GI bleeding requiring additional transfusion. EXAM: MESENTERIC ARTERIOGRAM 2 VESSEL ARTERIAL EMBOLIZATION  THIRD ORDER ULTRASOUND GUIDANCE FOR ARTERIAL ACCESS MEDICATIONS: No antibiotics indicated ANESTHESIA/SEDATION: Intravenous Fentanyl and Versed were administered as conscious sedation during continuous monitoring of the patient's level of consciousness and physiological / cardiorespiratory status by the radiology RN, with a total moderate sedation time of 47 minutes. CONTRAST:  Isovue-300 FLUOROSCOPY TIME:  6 minutes, 48 second, 363 mGy COMPLICATIONS: None immediate. PROCEDURE: Informed consent was obtained from the patient following explanation of the procedure, risks, benefits and alternatives. The patient understands, agrees and consents for the procedure. All questions were addressed. A time out was performed prior to the initiation of the procedure. Maximal barrier sterile technique utilized including caps, mask, sterile gowns, sterile gloves, large sterile drape, hand hygiene, and Betadine prep. Right femoral region prepped and draped in usual sterile fashion. Maximal barrier sterile technique was utilized including caps, mask, sterile gowns, sterile gloves, sterile drape, hand hygiene and skin antiseptic. The right common femoral artery was localized under ultrasound. Under real-time ultrasound guidance, the vessel was accessed with a 21-gauge micropuncture needle, exchanged over a 018 guidewire for a  transitional dilator, through which a 035 guidewire was advanced. Over this, a 5 [place] vascular sheath was placed,  through which a 5 [place] RIM catheter was advanced and used to selectively catheterize the inferior mesenteric artery for selective arteriography in multiple projections. The previously embolized segment showed continued closure. No active extravasation was identified on non selective and sub selective contrast injection. No additional vascular lesion identified. Normal venous phase. Empiric embolization of the third order branch supplying the bleeding region seen previously was performed using a single 2 mm x 4 cm interlock coil. Follow-up confirmatory arteriogram was obtained. The RIM catheter was then exchanged for a C2, utilized to selectively catheterize the superior mesenteric artery for selective arteriography in multiple projections. No active extravasation or vascular lesion was identified. Venous phase normal. The catheter and sheath were removed and hemostasis achieved with the aid of the Exoseal device after confirmatory femoral arteriography. The patient tolerated the procedure well. IMPRESSION: 1. Technically successful coil embolization of the inferior mesenteric artery third order branch supplying region of active extravasation seen on previous examination. 2. No additional areas of active extravasation or vascular lesion on selective superior and inferior mesenteric arteriography. Electronically Signed   By: Corlis Leak M.D.   On: 03/08/2017 16:31   Ir Embo Art  [facility] Hem[facility] Lymph [employer] Guide Roadmapping  Result Date: 03/08/2017 INDICATION: Lower GI bleed. Vascular lesion embolized 03/06/2017, with suspicion of intermittent bleeding from additional site. Clinically, patient has had continued lower GI bleeding requiring additional transfusion. EXAM: MESENTERIC ARTERIOGRAM 2 VESSEL ARTERIAL EMBOLIZATION  THIRD ORDER ULTRASOUND GUIDANCE FOR ARTERIAL ACCESS MEDICATIONS: No  antibiotics indicated ANESTHESIA/SEDATION: Intravenous Fentanyl and Versed were administered as conscious sedation during continuous monitoring of the patient's level of consciousness and physiological / cardiorespiratory status by the radiology RN, with a total moderate sedation time of 47 minutes. CONTRAST:  Isovue-300 FLUOROSCOPY TIME:  6 minutes, 48 second, 363 mGy COMPLICATIONS: None immediate. PROCEDURE: Informed consent was obtained from the patient following explanation of the procedure, risks, benefits and alternatives. The patient understands, agrees and consents for the procedure. All questions were addressed. A time out was performed prior to the initiation of the procedure. Maximal barrier sterile technique utilized including caps, mask, sterile gowns, sterile gloves, large sterile drape, hand hygiene, and Betadine prep. Right femoral region prepped and draped in usual sterile fashion. Maximal barrier sterile technique was utilized including caps, mask, sterile gowns, sterile gloves, sterile drape, hand hygiene and skin antiseptic. The right common femoral artery was localized under ultrasound. Under real-time ultrasound guidance, the vessel was accessed with a 21-gauge micropuncture needle, exchanged over a 018 guidewire for a transitional dilator, through which a 035 guidewire was advanced. Over this, a 5 [place] vascular sheath was placed, through which a 5 [place] RIM catheter was advanced and used to selectively catheterize the inferior mesenteric artery for selective arteriography in multiple projections. The previously embolized segment showed continued closure. No active extravasation was identified on non selective and sub selective contrast injection. No additional vascular lesion identified. Normal venous phase. Empiric embolization of the third order branch supplying the bleeding region seen previously was performed using a single 2 mm x 4 cm interlock coil. Follow-up confirmatory arteriogram  was obtained. The RIM catheter was then exchanged for a C2, utilized to selectively catheterize the superior mesenteric artery for selective arteriography in multiple projections. No active extravasation or vascular lesion was identified. Venous phase normal. The catheter and sheath were removed and hemostasis achieved with the aid of the Exoseal device after confirmatory femoral arteriography. The patient tolerated the procedure well. IMPRESSION: 1. Technically successful coil  embolization of the inferior mesenteric artery third order branch supplying region of active extravasation seen on previous examination. 2. No additional areas of active extravasation or vascular lesion on selective superior and inferior mesenteric arteriography. Electronically Signed   By: Corlis Leak  Hassell M.D.   On: 03/08/2017 16:31    Scheduled Meds: Continuous Infusions: . sodium chloride 75 mL/hr at 03/07/17 1529  . sodium chloride    . sodium chloride       LOS: 3 days   Time spent: 25 minutes.  Brenda Junkeryan Samanatha Brammer, MD [facility] Pager [phone]  If 7PM-7AM, please contact night-coverage [email or web address] Password [email or web address] 03/09/2017, 3:38 PM

## 2017-03-10 ENCOUNTER — Inpatient Hospital Stay (HOSPITAL_COMMUNITY): Payer: Medicare Other

## 2017-03-10 LAB — TYPE AND SCREEN
ABO/RH(D): B POS
Antibody Screen: NEGATIVE
Unit division: 0
Unit division: 0
Unit division: 0
Unit division: 0
Unit division: 0

## 2017-03-10 LAB — BPAM RBC
Blood Product Expiration Date: 201901312359
Blood Product Expiration Date: 201902012359
Blood Product Expiration Date: 201902022359
Blood Product Expiration Date: 201902192359
Blood Product Expiration Date: 201902242359
ISSUE DATE / TIME: 201901231532
ISSUE DATE / TIME: 201901232013
ISSUE DATE / TIME: 201901251144
ISSUE DATE / TIME: 201901260754
ISSUE DATE / TIME: 201901261742
Unit Type and Rh: 1700
Unit Type and Rh: 7300
Unit Type and Rh: 7300
Unit Type and Rh: 7300
Unit Type and Rh: 7300

## 2017-03-10 LAB — CBC
HCT: 28.2 % — ABNORMAL LOW (ref 36.0–46.0)
Hemoglobin: 9.3 g/dL — ABNORMAL LOW (ref 12.0–15.0)
MCH: 29.1 pg (ref 26.0–34.0)
MCHC: 33 g/dL (ref 30.0–36.0)
MCV: 88.1 fL (ref 78.0–100.0)
Platelets: 163 10*3/uL (ref 150–400)
RBC: 3.2 MIL/uL — ABNORMAL LOW (ref 3.87–5.11)
RDW: 16.9 % — ABNORMAL HIGH (ref 11.5–15.5)
WBC: 5.7 10*3/uL (ref 4.0–10.5)

## 2017-03-10 LAB — BASIC METABOLIC PANEL
Anion gap: 6 (ref 5–15)
BUN: 11 mg/dL (ref 6–20)
CO2: 22 mmol/L (ref 22–32)
Calcium: 8.1 mg/dL — ABNORMAL LOW (ref 8.9–10.3)
Chloride: 114 mmol/L — ABNORMAL HIGH (ref 101–111)
Creatinine, Ser: 1.06 mg/dL — ABNORMAL HIGH (ref 0.44–1.00)
GFR calc Af Amer: 52 mL/min — ABNORMAL LOW (ref >60)
GFR calc non Af Amer: 44 mL/min — ABNORMAL LOW (ref >60)
Glucose, Bld: 82 mg/dL (ref 65–99)
Potassium: 4.6 mmol/L (ref 3.5–5.1)
Sodium: 142 mmol/L (ref 135–145)

## 2017-03-10 MED ORDER — TECHNETIUM TC 99M-LABELED RED BLOOD CELLS IV KIT
27.4000 | PACK | Freq: Once | INTRAVENOUS | Status: AC | PRN
  Administered 2017-03-10: 27.4 via INTRAVENOUS

## 2017-03-10 MED ORDER — ALUM & MAG HYDROXIDE-SIMETH 200-200-20 MG/5ML PO SUSP
30.0000 mL | ORAL | Status: DC | PRN
  Filled 2017-03-10: qty 30

## 2017-03-10 MED ORDER — PANTOPRAZOLE SODIUM 40 MG PO TBEC
40.0000 mg | DELAYED_RELEASE_TABLET | Freq: Every day | ORAL | Status: DC
  Administered 2017-03-10: 40 mg via ORAL
  Filled 2017-03-10: qty 1

## 2017-03-10 NOTE — Progress Notes (Signed)
PROGRESS NOTE  [name]  NWG:[id number] DOB: 1923/11/21 DOA: 03/06/2017 PCP: Fleet Contras, MD   Brief Narrative: [name] is a 84 y.o. female with a history of diverticulosis with recurrent GI bleeding, aortic stenosis, HTN on ASA who presented for lower abdominal cramping and blood in stool. Hgb noted to be 8.7 (previously ~10), 2u PRBCs provided and bleeding scan was performed showing bleed at splenic flexure. Subsequent angiogram with coil embolization of actively bleeding branch of the left colic artery was performed in IR 1/23 and repeated with a different branch 1/25. 1u PRBCs given 1/25 and again 1/26. Bleeding persists, so repeat tagged RBC scan is requested.   Assessment & Plan: Principal Problem:   Acute lower GI bleeding Active Problems:   Diverticula, colon   HTN (hypertension)   High cholesterol   Acute kidney injury (HCC)   Acute blood loss anemia  Diverticulosis with acute lower GI bleeding and acute blood loss anemia on chronic iron-deficiency anemia:  - Underwent coil embolization of left colic artery 1/23 and repeat angio with embolization of another source of bleeding 1/25.  - Last transfusion 1/26, hgb stable, bleeding slowing, negative tagged scan 1/27. Not candidate for invasive GI evaluation. Will restart clear liquids.  - CBC in AM or if rebleeding.  Thrombocytopenia: Mild.  - Holding ASA, will continue to monitor  AKI on stage III CKD: Cr improved with fluids, transfusions to baseline ~1-1.2.  - Continue daily monitoring, though fortunately appears to be stable.    HTN:  - Holding metoprolol, losartan/HCTZ. Pressures rising slightly, plan to restart meds as able over next 24 hours.  Hypercholesterolemia: Intolerant to multiple medications.   DVT prophylaxis: SCDs Code Status: Full Family Communication: None at bedside Disposition Plan: If bleeding subsides, transfer to floor 1/28.   Consultants:   GI  IR  Procedures:  - Visceral  angiogram with coil embolization of bleeding branch of the left colic artery 03/06/2017.  - Mesenteric angio (2-vessel) with embolization IMA branch site of extravasation seen previously 03/08/2017.  Antimicrobials:  None  Subjective: Had very small volume maroon stool this morning before study which was negative. None since. Hungry. No longer lightheaded.    Objective: Vitals:   03/10/17 0350 03/10/17 0522 03/10/17 0731 03/10/17 1240  BP: (!) 125/55  137/72 (!) 156/70  Pulse: 66     Resp: 14  17 19   Temp: 98.2 F (36.8 C)  97.7 F (36.5 C) 97.9 F (36.6 C)  TempSrc: Oral  Oral Oral  SpO2:      Weight:  92.4 kg (203 lb 11.3 oz)    Height:        Intake/Output Summary (Last 24 hours) at 03/10/2017 1325 Last data filed at 03/10/2017 0530 Gross per 24 hour  Intake 2067.75 ml  Output -  Net 2067.75 ml   Filed Weights   03/08/17 0546 03/09/17 0550 03/10/17 0522  Weight: 92.2 kg (203 lb 3.2 oz) 92.9 kg (204 lb 12.9 oz) 92.4 kg (203 lb 11.3 oz)    Gen: Pleasant, elderly female in no distress laying in bed.  Pulm: Non-labored breathing room air. Clear to auscultation bilaterally.  CV: Regular borderline bradycardia. No murmur, rub, or gallop. No JVD, trace pedal edema. GI: Abdomen soft, no tenderness, distention, +BS. Ext: Warm, no deformities Skin: No rashes, lesions no ulcers Neuro: Alert and oriented. No focal neurological deficits. Psych: Judgement and insight appear normal. Mood & affect appropriate.   Data Reviewed: I have personally reviewed following labs  and imaging studies  CBC: Recent Labs  Lab 03/06/17 0839 03/07/17 0310 03/07/17 0745  03/08/17 1713 03/09/17 1191 03/09/17 1209 03/09/17 2251 03/10/17 0452  WBC 4.5 8.2 8.2  --   --   --   --  6.3 5.7  NEUTROABS 3.0  --   --   --   --   --   --   --   --   HGB 8.7* 8.6* 8.1*   < > 9.0* 7.1* 9.0* 9.5* 9.3*  HCT 26.5* 25.7* 24.4*   < > 27.5* 20.9* 26.9* 28.7* 28.2*  MCV 92.7 91.1 89.4  --   --   --   --   87.5 88.1  PLT 243 147* 143*  --   --   --   --  149* 163   < > = values in this interval not displayed.   Basic Metabolic Panel: Recent Labs  Lab 03/06/17 0839 03/07/17 0745 03/08/17 0236 03/09/17 0613 03/10/17 0452  NA 140 141 141 138 142  K 4.0 4.1 3.8 3.8 4.6  CL 106 111 111 111 114*  CO2 24 22 23  19* 22  GLUCOSE 117* 101* 98 92 82  BUN 27* 20 17 15 11   CREATININE 1.41* 1.22* 1.28* 1.13* 1.06*  CALCIUM 8.9 8.1* 7.8* 7.6* 8.1*   GFR: Estimated Creatinine Clearance: 37.3 mL/min (A) (by C-G formula based on SCr of 1.06 mg/dL (H)).  Radiology Studies: Nm Gi Blood Loss  Result Date: 03/10/2017 CLINICAL DATA:  GI bleeding. EXAM: NUCLEAR MEDICINE GASTROINTESTINAL BLEEDING SCAN TECHNIQUE: Sequential abdominal images were obtained following intravenous administration of Tc-60m labeled red blood cells. RADIOPHARMACEUTICALS:  27.4 mCi Tc-3m pertechnetate in-vitro labeled red cells. COMPARISON:  03/06/2017 FINDINGS: No evidence extravascular radiotracer accumulation to suggest active GI bleeding. IMPRESSION: Negative study for active GI bleeding. Electronically Signed   By: Kennith Center M.D.   On: 03/10/2017 12:01   Ir Angiogram Visceral Selective  Result Date: 03/08/2017 INDICATION: Lower GI bleed. Vascular lesion embolized 03/06/2017, with suspicion of intermittent bleeding from additional site. Clinically, patient has had continued lower GI bleeding requiring additional transfusion. EXAM: MESENTERIC ARTERIOGRAM 2 VESSEL ARTERIAL EMBOLIZATION  THIRD ORDER ULTRASOUND GUIDANCE FOR ARTERIAL ACCESS MEDICATIONS: No antibiotics indicated ANESTHESIA/SEDATION: Intravenous Fentanyl and Versed were administered as conscious sedation during continuous monitoring of the patient's level of consciousness and physiological / cardiorespiratory status by the radiology RN, with a total moderate sedation time of 47 minutes. CONTRAST:  Isovue-300 FLUOROSCOPY TIME:  6 minutes, 48 second, 363 mGy COMPLICATIONS:  None immediate. PROCEDURE: Informed consent was obtained from the patient following explanation of the procedure, risks, benefits and alternatives. The patient understands, agrees and consents for the procedure. All questions were addressed. A time out was performed prior to the initiation of the procedure. Maximal barrier sterile technique utilized including caps, mask, sterile gowns, sterile gloves, large sterile drape, hand hygiene, and Betadine prep. Right femoral region prepped and draped in usual sterile fashion. Maximal barrier sterile technique was utilized including caps, mask, sterile gowns, sterile gloves, sterile drape, hand hygiene and skin antiseptic. The right common femoral artery was localized under ultrasound. Under real-time ultrasound guidance, the vessel was accessed with a 21-gauge micropuncture needle, exchanged over a 018 guidewire for a transitional dilator, through which a 035 guidewire was advanced. Over this, a 5 [place] vascular sheath was placed, through which a 5 [place] RIM catheter was advanced and used to selectively catheterize the inferior mesenteric artery for selective arteriography in multiple projections. The previously  embolized segment showed continued closure. No active extravasation was identified on non selective and sub selective contrast injection. No additional vascular lesion identified. Normal venous phase. Empiric embolization of the third order branch supplying the bleeding region seen previously was performed using a single 2 mm x 4 cm interlock coil. Follow-up confirmatory arteriogram was obtained. The RIM catheter was then exchanged for a C2, utilized to selectively catheterize the superior mesenteric artery for selective arteriography in multiple projections. No active extravasation or vascular lesion was identified. Venous phase normal. The catheter and sheath were removed and hemostasis achieved with the aid of the Exoseal device after confirmatory femoral  arteriography. The patient tolerated the procedure well. IMPRESSION: 1. Technically successful coil embolization of the inferior mesenteric artery third order branch supplying region of active extravasation seen on previous examination. 2. No additional areas of active extravasation or vascular lesion on selective superior and inferior mesenteric arteriography. Electronically Signed   By: Corlis Leak M.D.   On: 03/08/2017 16:31   Ir Angiogram Visceral Selective  Result Date: 03/08/2017 INDICATION: Lower GI bleed. Vascular lesion embolized 03/06/2017, with suspicion of intermittent bleeding from additional site. Clinically, patient has had continued lower GI bleeding requiring additional transfusion. EXAM: MESENTERIC ARTERIOGRAM 2 VESSEL ARTERIAL EMBOLIZATION  THIRD ORDER ULTRASOUND GUIDANCE FOR ARTERIAL ACCESS MEDICATIONS: No antibiotics indicated ANESTHESIA/SEDATION: Intravenous Fentanyl and Versed were administered as conscious sedation during continuous monitoring of the patient's level of consciousness and physiological / cardiorespiratory status by the radiology RN, with a total moderate sedation time of 47 minutes. CONTRAST:  Isovue-300 FLUOROSCOPY TIME:  6 minutes, 48 second, 363 mGy COMPLICATIONS: None immediate. PROCEDURE: Informed consent was obtained from the patient following explanation of the procedure, risks, benefits and alternatives. The patient understands, agrees and consents for the procedure. All questions were addressed. A time out was performed prior to the initiation of the procedure. Maximal barrier sterile technique utilized including caps, mask, sterile gowns, sterile gloves, large sterile drape, hand hygiene, and Betadine prep. Right femoral region prepped and draped in usual sterile fashion. Maximal barrier sterile technique was utilized including caps, mask, sterile gowns, sterile gloves, sterile drape, hand hygiene and skin antiseptic. The right common femoral artery was localized  under ultrasound. Under real-time ultrasound guidance, the vessel was accessed with a 21-gauge micropuncture needle, exchanged over a 018 guidewire for a transitional dilator, through which a 035 guidewire was advanced. Over this, a 5 [place] vascular sheath was placed, through which a 5 [place] RIM catheter was advanced and used to selectively catheterize the inferior mesenteric artery for selective arteriography in multiple projections. The previously embolized segment showed continued closure. No active extravasation was identified on non selective and sub selective contrast injection. No additional vascular lesion identified. Normal venous phase. Empiric embolization of the third order branch supplying the bleeding region seen previously was performed using a single 2 mm x 4 cm interlock coil. Follow-up confirmatory arteriogram was obtained. The RIM catheter was then exchanged for a C2, utilized to selectively catheterize the superior mesenteric artery for selective arteriography in multiple projections. No active extravasation or vascular lesion was identified. Venous phase normal. The catheter and sheath were removed and hemostasis achieved with the aid of the Exoseal device after confirmatory femoral arteriography. The patient tolerated the procedure well. IMPRESSION: 1. Technically successful coil embolization of the inferior mesenteric artery third order branch supplying region of active extravasation seen on previous examination. 2. No additional areas of active extravasation or vascular lesion on selective superior and inferior mesenteric  arteriography. Electronically Signed   By: Corlis Leak  Hassell M.D.   On: 03/08/2017 16:31   Ir Angiogram Selective Each Additional Vessel  Result Date: 03/08/2017 INDICATION: Lower GI bleed. Vascular lesion embolized 03/06/2017, with suspicion of intermittent bleeding from additional site. Clinically, patient has had continued lower GI bleeding requiring additional  transfusion. EXAM: MESENTERIC ARTERIOGRAM 2 VESSEL ARTERIAL EMBOLIZATION  THIRD ORDER ULTRASOUND GUIDANCE FOR ARTERIAL ACCESS MEDICATIONS: No antibiotics indicated ANESTHESIA/SEDATION: Intravenous Fentanyl and Versed were administered as conscious sedation during continuous monitoring of the patient's level of consciousness and physiological / cardiorespiratory status by the radiology RN, with a total moderate sedation time of 47 minutes. CONTRAST:  Isovue-300 FLUOROSCOPY TIME:  6 minutes, 48 second, 363 mGy COMPLICATIONS: None immediate. PROCEDURE: Informed consent was obtained from the patient following explanation of the procedure, risks, benefits and alternatives. The patient understands, agrees and consents for the procedure. All questions were addressed. A time out was performed prior to the initiation of the procedure. Maximal barrier sterile technique utilized including caps, mask, sterile gowns, sterile gloves, large sterile drape, hand hygiene, and Betadine prep. Right femoral region prepped and draped in usual sterile fashion. Maximal barrier sterile technique was utilized including caps, mask, sterile gowns, sterile gloves, sterile drape, hand hygiene and skin antiseptic. The right common femoral artery was localized under ultrasound. Under real-time ultrasound guidance, the vessel was accessed with a 21-gauge micropuncture needle, exchanged over a 018 guidewire for a transitional dilator, through which a 035 guidewire was advanced. Over this, a 5 [place] vascular sheath was placed, through which a 5 [place] RIM catheter was advanced and used to selectively catheterize the inferior mesenteric artery for selective arteriography in multiple projections. The previously embolized segment showed continued closure. No active extravasation was identified on non selective and sub selective contrast injection. No additional vascular lesion identified. Normal venous phase. Empiric embolization of the third order  branch supplying the bleeding region seen previously was performed using a single 2 mm x 4 cm interlock coil. Follow-up confirmatory arteriogram was obtained. The RIM catheter was then exchanged for a C2, utilized to selectively catheterize the superior mesenteric artery for selective arteriography in multiple projections. No active extravasation or vascular lesion was identified. Venous phase normal. The catheter and sheath were removed and hemostasis achieved with the aid of the Exoseal device after confirmatory femoral arteriography. The patient tolerated the procedure well. IMPRESSION: 1. Technically successful coil embolization of the inferior mesenteric artery third order branch supplying region of active extravasation seen on previous examination. 2. No additional areas of active extravasation or vascular lesion on selective superior and inferior mesenteric arteriography. Electronically Signed   By: Corlis Leak  Hassell M.D.   On: 03/08/2017 16:31   Ir [place] Guide Vasc Access Right  Result Date: 03/08/2017 INDICATION: Lower GI bleed. Vascular lesion embolized 03/06/2017, with suspicion of intermittent bleeding from additional site. Clinically, patient has had continued lower GI bleeding requiring additional transfusion. EXAM: MESENTERIC ARTERIOGRAM 2 VESSEL ARTERIAL EMBOLIZATION  THIRD ORDER ULTRASOUND GUIDANCE FOR ARTERIAL ACCESS MEDICATIONS: No antibiotics indicated ANESTHESIA/SEDATION: Intravenous Fentanyl and Versed were administered as conscious sedation during continuous monitoring of the patient's level of consciousness and physiological / cardiorespiratory status by the radiology RN, with a total moderate sedation time of 47 minutes. CONTRAST:  Isovue-300 FLUOROSCOPY TIME:  6 minutes, 48 second, 363 mGy COMPLICATIONS: None immediate. PROCEDURE: Informed consent was obtained from the patient following explanation of the procedure, risks, benefits and alternatives. The patient understands, agrees and consents  for the procedure. All  questions were addressed. A time out was performed prior to the initiation of the procedure. Maximal barrier sterile technique utilized including caps, mask, sterile gowns, sterile gloves, large sterile drape, hand hygiene, and Betadine prep. Right femoral region prepped and draped in usual sterile fashion. Maximal barrier sterile technique was utilized including caps, mask, sterile gowns, sterile gloves, sterile drape, hand hygiene and skin antiseptic. The right common femoral artery was localized under ultrasound. Under real-time ultrasound guidance, the vessel was accessed with a 21-gauge micropuncture needle, exchanged over a 018 guidewire for a transitional dilator, through which a 035 guidewire was advanced. Over this, a 5 [place] vascular sheath was placed, through which a 5 [place] RIM catheter was advanced and used to selectively catheterize the inferior mesenteric artery for selective arteriography in multiple projections. The previously embolized segment showed continued closure. No active extravasation was identified on non selective and sub selective contrast injection. No additional vascular lesion identified. Normal venous phase. Empiric embolization of the third order branch supplying the bleeding region seen previously was performed using a single 2 mm x 4 cm interlock coil. Follow-up confirmatory arteriogram was obtained. The RIM catheter was then exchanged for a C2, utilized to selectively catheterize the superior mesenteric artery for selective arteriography in multiple projections. No active extravasation or vascular lesion was identified. Venous phase normal. The catheter and sheath were removed and hemostasis achieved with the aid of the Exoseal device after confirmatory femoral arteriography. The patient tolerated the procedure well. IMPRESSION: 1. Technically successful coil embolization of the inferior mesenteric artery third order branch supplying region of active  extravasation seen on previous examination. 2. No additional areas of active extravasation or vascular lesion on selective superior and inferior mesenteric arteriography. Electronically Signed   By: Corlis Leak M.D.   On: 03/08/2017 16:31   Ir Embo Art  Peter Minium Hemorr Lymph [employer] Guide Roadmapping  Result Date: 03/08/2017 INDICATION: Lower GI bleed. Vascular lesion embolized 03/06/2017, with suspicion of intermittent bleeding from additional site. Clinically, patient has had continued lower GI bleeding requiring additional transfusion. EXAM: MESENTERIC ARTERIOGRAM 2 VESSEL ARTERIAL EMBOLIZATION  THIRD ORDER ULTRASOUND GUIDANCE FOR ARTERIAL ACCESS MEDICATIONS: No antibiotics indicated ANESTHESIA/SEDATION: Intravenous Fentanyl and Versed were administered as conscious sedation during continuous monitoring of the patient's level of consciousness and physiological / cardiorespiratory status by the radiology RN, with a total moderate sedation time of 47 minutes. CONTRAST:  Isovue-300 FLUOROSCOPY TIME:  6 minutes, 48 second, 363 mGy COMPLICATIONS: None immediate. PROCEDURE: Informed consent was obtained from the patient following explanation of the procedure, risks, benefits and alternatives. The patient understands, agrees and consents for the procedure. All questions were addressed. A time out was performed prior to the initiation of the procedure. Maximal barrier sterile technique utilized including caps, mask, sterile gowns, sterile gloves, large sterile drape, hand hygiene, and Betadine prep. Right femoral region prepped and draped in usual sterile fashion. Maximal barrier sterile technique was utilized including caps, mask, sterile gowns, sterile gloves, sterile drape, hand hygiene and skin antiseptic. The right common femoral artery was localized under ultrasound. Under real-time ultrasound guidance, the vessel was accessed with a 21-gauge micropuncture needle, exchanged over a 018 guidewire for a transitional  dilator, through which a 035 guidewire was advanced. Over this, a 5 [place] vascular sheath was placed, through which a 5 [place] RIM catheter was advanced and used to selectively catheterize the inferior mesenteric artery for selective arteriography in multiple projections. The previously embolized segment showed continued closure. No active extravasation was  identified on non selective and sub selective contrast injection. No additional vascular lesion identified. Normal venous phase. Empiric embolization of the third order branch supplying the bleeding region seen previously was performed using a single 2 mm x 4 cm interlock coil. Follow-up confirmatory arteriogram was obtained. The RIM catheter was then exchanged for a C2, utilized to selectively catheterize the superior mesenteric artery for selective arteriography in multiple projections. No active extravasation or vascular lesion was identified. Venous phase normal. The catheter and sheath were removed and hemostasis achieved with the aid of the Exoseal device after confirmatory femoral arteriography. The patient tolerated the procedure well. IMPRESSION: 1. Technically successful coil embolization of the inferior mesenteric artery third order branch supplying region of active extravasation seen on previous examination. 2. No additional areas of active extravasation or vascular lesion on selective superior and inferior mesenteric arteriography. Electronically Signed   By: Corlis Leak M.D.   On: 03/08/2017 16:31    Scheduled Meds: Continuous Infusions: . sodium chloride 75 mL/hr at 03/10/17 1150  . sodium chloride    . sodium chloride       LOS: 4 days   Time spent: 25 minutes.  Hazeline Junker, MD [facility] Pager [phone]  If 7PM-7AM, please contact night-coverage [email or web address] Password [email or web address] 03/10/2017, 1:25 PM

## 2017-03-10 NOTE — Progress Notes (Signed)
Pt had one BM this shift with a medium amount of dark colored blood.

## 2017-03-11 LAB — CBC
HCT: 30.6 % — ABNORMAL LOW (ref 36.0–46.0)
Hemoglobin: 10.2 g/dL — ABNORMAL LOW (ref 12.0–15.0)
MCH: 29.7 pg (ref 26.0–34.0)
MCHC: 33.3 g/dL (ref 30.0–36.0)
MCV: 89 fL (ref 78.0–100.0)
Platelets: 185 10*3/uL (ref 150–400)
RBC: 3.44 MIL/uL — ABNORMAL LOW (ref 3.87–5.11)
RDW: 17.1 % — ABNORMAL HIGH (ref 11.5–15.5)
WBC: 6 10*3/uL (ref 4.0–10.5)

## 2017-03-11 LAB — BASIC METABOLIC PANEL
Anion gap: 7 (ref 5–15)
BUN: 8 mg/dL (ref 6–20)
CO2: 20 mmol/L — ABNORMAL LOW (ref 22–32)
Calcium: 8.2 mg/dL — ABNORMAL LOW (ref 8.9–10.3)
Chloride: 113 mmol/L — ABNORMAL HIGH (ref 101–111)
Creatinine, Ser: 1.02 mg/dL — ABNORMAL HIGH (ref 0.44–1.00)
GFR calc Af Amer: 54 mL/min — ABNORMAL LOW (ref >60)
GFR calc non Af Amer: 47 mL/min — ABNORMAL LOW (ref >60)
Glucose, Bld: 86 mg/dL (ref 65–99)
Potassium: 3.8 mmol/L (ref 3.5–5.1)
Sodium: 140 mmol/L (ref 135–145)

## 2017-03-11 MED ORDER — CALCIUM CARBONATE ANTACID 500 MG PO CHEW
1.0000 | CHEWABLE_TABLET | Freq: Three times a day (TID) | ORAL | Status: DC
  Administered 2017-03-11 – 2017-03-12 (×4): 200 mg via ORAL
  Filled 2017-03-11 (×3): qty 1

## 2017-03-11 MED ORDER — PANTOPRAZOLE SODIUM 40 MG PO TBEC
40.0000 mg | DELAYED_RELEASE_TABLET | Freq: Every day | ORAL | Status: DC
  Administered 2017-03-11 – 2017-03-12 (×2): 40 mg via ORAL
  Filled 2017-03-11: qty 1

## 2017-03-11 MED ORDER — HYDROCHLOROTHIAZIDE 12.5 MG PO CAPS
12.5000 mg | ORAL_CAPSULE | Freq: Every day | ORAL | Status: DC
  Administered 2017-03-11 – 2017-03-12 (×2): 12.5 mg via ORAL
  Filled 2017-03-11 (×2): qty 1

## 2017-03-11 NOTE — Progress Notes (Signed)
PROGRESS NOTE  [name]  RUE:[id number] DOB: 18-Aug-1923 DOA: 03/06/2017 PCP: Fleet Contras, MD   Brief Narrative: [name] is a 84 y.o. female with a history of diverticulosis with recurrent GI bleeding, aortic stenosis, HTN on ASA who presented for lower abdominal cramping and blood in stool. Hgb noted to be 8.7 (previously ~10), 2u PRBCs provided and bleeding scan was performed showing bleed at splenic flexure. Subsequent angiogram with coil embolization of actively bleeding branch of the left colic artery was performed in IR 1/23 and repeated with a different branch 1/25. 1u PRBCs given 1/25 and again 1/26. Bleeding persisted, so repeat tagged RBC scan is requested, was negative. Bleeding has subsided, so patient transferred to medical floor 1/28.  Assessment & Plan: Principal Problem:   Acute lower GI bleeding Active Problems:   Diverticula, colon   HTN (hypertension)   High cholesterol   Acute kidney injury (HCC)   Acute blood loss anemia  Diverticulosis with acute lower GI bleeding and acute blood loss anemia on chronic iron-deficiency anemia:  - Underwent coil embolization of left colic artery 1/23 and repeat angio with embolization of another source of bleeding 1/25.  - Last transfusion 1/26, hgb stable, bleeding slowing, negative tagged scan 1/27. Not candidate for invasive GI evaluation. - Advance diet - CBC in AM or if rebleeding.  Thrombocytopenia: Mild.  - Holding ASA, will continue to monitor  AKI on stage III CKD: Cr improved with fluids, transfusions to baseline ~1-1.2.  - Continue daily monitoring, though fortunately appears to be stable.    HTN:  - Holding metoprolol, will restart HCTZ, hold losartan.  Hypercholesterolemia: Intolerant to multiple medications.   DVT prophylaxis: SCDs Code Status: Full Family Communication: None at bedside Disposition Plan: Transfer to floor, advance diet. If bleeding subsides, discharge 1/29.  Consultants:    GI  IR  Procedures:  - Visceral angiogram with coil embolization of bleeding branch of the left colic artery 03/06/2017.  - Mesenteric angio (2-vessel) with embolization IMA branch site of extravasation seen previously 03/08/2017.  Antimicrobials:  None  Subjective: Only 1 BM yesterday w/medium amount of dark blood. None overnight, eating liquids without issues but reports return of GERD symptoms, requesting TUMS. Felt lightheaded but thinks this is due to not eating much.    Objective: Vitals:   03/10/17 1928 03/10/17 2321 03/11/17 0402 03/11/17 0714  BP: (!) 151/81 (!) 145/84 (!) 146/71 125/66  Pulse:  72 72   Resp: 16 20 16    Temp: 97.8 F (36.6 C) 98 F (36.7 C) 97.8 F (36.6 C) 98 F (36.7 C)  TempSrc: Oral Oral Oral Oral  SpO2:  100% 99%   Weight:   92.5 kg (203 lb 14.8 oz)   Height:        Intake/Output Summary (Last 24 hours) at 03/11/2017 1206 Last data filed at 03/11/2017 0950 Gross per 24 hour  Intake 50 ml  Output 400 ml  Net -350 ml   Filed Weights   03/09/17 0550 03/10/17 0522 03/11/17 0402  Weight: 92.9 kg (204 lb 12.9 oz) 92.4 kg (203 lb 11.3 oz) 92.5 kg (203 lb 14.8 oz)    Gen: Pleasant, elderly female in no distress laying in bed.  Pulm: Non-labored breathing room air. Clear to auscultation bilaterally.  CV: Regular rate and rhythm. No murmur, rub, or gallop. No JVD, no significant edema GI: Abdomen soft, no tenderness, distention, +BS. Benign Ext: Warm, no deformities Skin: No rashes, lesions no ulcers Neuro: Alert and oriented.  No focal neurological deficits. Psych: Judgement and insight appear normal. Mood & affect appropriate.   Data Reviewed: I have personally reviewed following labs and imaging studies  CBC: Recent Labs  Lab 03/06/17 0839 03/07/17 0310 03/07/17 0745  03/09/17 [id number] 03/09/17 1209 03/09/17 2251 03/10/17 0452 03/11/17 0921  WBC 4.5 8.2 8.2  --   --   --  6.3 5.7 6.0  NEUTROABS 3.0  --   --   --   --   --   --   --    --   HGB 8.7* 8.6* 8.1*   < > 7.1* 9.0* 9.5* 9.3* 10.2*  HCT 26.5* 25.7* 24.4*   < > 20.9* 26.9* 28.7* 28.2* 30.6*  MCV 92.7 91.1 89.4  --   --   --  87.5 88.1 89.0  PLT 243 147* 143*  --   --   --  149* 163 185   < > = values in this interval not displayed.   Basic Metabolic Panel: Recent Labs  Lab 03/07/17 0745 03/08/17 0236 03/09/17 [id number] 03/10/17 0452 03/11/17 0921  NA 141 141 138 142 140  K 4.1 3.8 3.8 4.6 3.8  CL 111 111 111 114* 113*  CO2 22 23 19* 22 20*  GLUCOSE 101* 98 92 82 86  BUN 20 17 15 11 8   CREATININE 1.22* 1.28* 1.13* 1.06* 1.02*  CALCIUM 8.1* 7.8* 7.6* 8.1* 8.2*   GFR: Estimated Creatinine Clearance: 38.8 mL/min (A) (by C-G formula based on SCr of 1.02 mg/dL (H)).  Radiology Studies: Nm Gi Blood Loss  Result Date: 03/10/2017 CLINICAL DATA:  GI bleeding. EXAM: NUCLEAR MEDICINE GASTROINTESTINAL BLEEDING SCAN TECHNIQUE: Sequential abdominal images were obtained following intravenous administration of Tc-3463m labeled red blood cells. RADIOPHARMACEUTICALS:  27.4 mCi Tc-5363m pertechnetate in-vitro labeled red cells. COMPARISON:  03/06/2017 FINDINGS: No evidence extravascular radiotracer accumulation to suggest active GI bleeding. IMPRESSION: Negative study for active GI bleeding. Electronically Signed   By: Kennith CenterEric  Mansell M.D.   On: 03/10/2017 12:01    Scheduled Meds: . calcium carbonate  1 tablet Oral TID WC  . pantoprazole  40 mg Oral QAC breakfast   Continuous Infusions: . sodium chloride    . sodium chloride       LOS: 5 days   Time spent: 25 minutes.  Hazeline Junkeryan Lynett Brasil, MD [facility] Pager [phone]  If 7PM-7AM, please contact night-coverage [email or web address] Password [email or web address] 03/11/2017, 12:06 PM

## 2017-03-11 NOTE — Progress Notes (Signed)
Patient arrived to 6N26 A&Ox4, VSS, RFA and LFA IV intact and saline locked.  No family at bedside.  Patient oriented to room and equipment.  Will continue to monitor.

## 2017-03-11 NOTE — Progress Notes (Signed)
Patient ID: [name], female   DOB: 03/11/1923, 84 y.o.   MRN: [id number]    Referring Physician(s): Dr. Hazeline Junkeryan Grunz  Supervising Physician: Jolaine ClickHoss, Arthur  [facility]: [facility]  Chief Complaint: GI bleed  Subjective: Patient feels well today.  Had a bloody BM at 0400am this morning, but this was dark and not bright red.  Allergies: Ezetimibe; Lipitor [atorvastatin]; Pravachol [pravastatin]; Statins; Zocor [simvastatin]; and Cholestyramine  Medications: Prior to Admission medications   Medication Sig Start Date End Date Taking? Authorizing Provider  acetaminophen (TYLENOL) 325 MG tablet Take 325 mg by mouth every 6 (six) hours as needed. pain   Yes [provider]  aspirin EC 81 MG tablet Take 81 mg by mouth daily.   Yes [provider]  Cholecalciferol (VITAMIN D3) 2000 units TABS Take 2,000 Units by mouth daily.   Yes [provider]  diclofenac sodium (VOLTAREN) 1 % GEL Apply 2 g topically 2 (two) times daily.   Yes [provider]  famotidine (PEPCID) 40 MG tablet Take 40 mg by mouth daily. 02/12/17  Yes [provider]  ferrous sulfate 325 (65 FE) MG tablet Take 325 mg by mouth daily with breakfast.   Yes [provider]  furosemide (LASIX) 40 MG tablet Take 40 mg by mouth daily. 01/14/17  Yes [provider]  losartan-hydrochlorothiazide (HYZAAR) 100-12.5 MG tablet Take 1 tablet by mouth daily.   Yes [provider]  metoprolol tartrate (LOPRESSOR) 25 MG tablet Take 25 mg by mouth 2 (two) times daily.   Yes [provider]  Multiple Vitamin (MULITIVITAMIN WITH MINERALS) TABS Take 1 tablet by mouth daily.   Yes [provider]  nitroGLYCERIN (NITROSTAT) 0.4 MG SL tablet Place 0.4 mg under the tongue every 5 (five) minutes as needed for chest pain.    Yes [provider]  polyethylene glycol powder (GLYCOLAX/MIRALAX) powder Take 17 g by mouth daily as needed for mild  constipation.   Yes [provider]  PROAIR HFA 108 (90 Base) MCG/ACT inhaler Inhale 1-2 puffs into the lungs every 4 (four) hours as needed for shortness of breath or wheezing. 12/04/16  Yes [provider]    Vital Signs: BP 125/66 (BP Location: Left Arm)   Pulse 72   Temp 98 F (36.7 C) (Oral)   Resp 16   Ht 5\' 3"  (1.6 m)   Wt 203 lb 14.8 oz (92.5 kg)   SpO2 99%   BMI 36.12 kg/m   Physical Exam: Abd: soft, NT, ND, +BS, R CFA site is c/d/i  Imaging: Nm Gi Blood Loss  Result Date: 03/10/2017 CLINICAL DATA:  GI bleeding. EXAM: NUCLEAR MEDICINE GASTROINTESTINAL BLEEDING SCAN TECHNIQUE: Sequential abdominal images were obtained following intravenous administration of Tc-3820m labeled red blood cells. RADIOPHARMACEUTICALS:  27.4 mCi Tc-1220m pertechnetate in-vitro labeled red cells. COMPARISON:  03/06/2017 FINDINGS: No evidence extravascular radiotracer accumulation to suggest active GI bleeding. IMPRESSION: Negative study for active GI bleeding. Electronically Signed   By: Kennith CenterEric  Mansell M.D.   On: 03/10/2017 12:01   Ir Angiogram Visceral Selective  Result Date: 03/08/2017 INDICATION: Lower GI bleed. Vascular lesion embolized 03/06/2017, with suspicion of intermittent bleeding from additional site. Clinically, patient has had continued lower GI bleeding requiring additional transfusion. EXAM: MESENTERIC ARTERIOGRAM 2 VESSEL ARTERIAL EMBOLIZATION  THIRD ORDER ULTRASOUND GUIDANCE FOR ARTERIAL ACCESS MEDICATIONS: No antibiotics indicated ANESTHESIA/SEDATION: Intravenous Fentanyl and Versed were administered as conscious sedation during continuous monitoring of the patient's level of consciousness and physiological /  cardiorespiratory status by the radiology RN, with a total moderate sedation time of 47 minutes. CONTRAST:  Isovue-300 FLUOROSCOPY TIME:  6 minutes, 48 second, 363 mGy COMPLICATIONS: None immediate. PROCEDURE: Informed consent was obtained from the patient following  explanation of the procedure, risks, benefits and alternatives. The patient understands, agrees and consents for the procedure. All questions were addressed. A time out was performed prior to the initiation of the procedure. Maximal barrier sterile technique utilized including caps, mask, sterile gowns, sterile gloves, large sterile drape, hand hygiene, and Betadine prep. Right femoral region prepped and draped in usual sterile fashion. Maximal barrier sterile technique was utilized including caps, mask, sterile gowns, sterile gloves, sterile drape, hand hygiene and skin antiseptic. The right common femoral artery was localized under ultrasound. Under real-time ultrasound guidance, the vessel was accessed with a 21-gauge micropuncture needle, exchanged over a 018 guidewire for a transitional dilator, through which a 035 guidewire was advanced. Over this, a 5 [place] vascular sheath was placed, through which a 5 [place] RIM catheter was advanced and used to selectively catheterize the inferior mesenteric artery for selective arteriography in multiple projections. The previously embolized segment showed continued closure. No active extravasation was identified on non selective and sub selective contrast injection. No additional vascular lesion identified. Normal venous phase. Empiric embolization of the third order branch supplying the bleeding region seen previously was performed using a single 2 mm x 4 cm interlock coil. Follow-up confirmatory arteriogram was obtained. The RIM catheter was then exchanged for a C2, utilized to selectively catheterize the superior mesenteric artery for selective arteriography in multiple projections. No active extravasation or vascular lesion was identified. Venous phase normal. The catheter and sheath were removed and hemostasis achieved with the aid of the Exoseal device after confirmatory femoral arteriography. The patient tolerated the procedure well. IMPRESSION: 1. Technically  successful coil embolization of the inferior mesenteric artery third order branch supplying region of active extravasation seen on previous examination. 2. No additional areas of active extravasation or vascular lesion on selective superior and inferior mesenteric arteriography. Electronically Signed   By: Corlis Leak M.D.   On: 03/08/2017 16:31   Ir Angiogram Visceral Selective  Result Date: 03/08/2017 INDICATION: Lower GI bleed. Vascular lesion embolized 03/06/2017, with suspicion of intermittent bleeding from additional site. Clinically, patient has had continued lower GI bleeding requiring additional transfusion. EXAM: MESENTERIC ARTERIOGRAM 2 VESSEL ARTERIAL EMBOLIZATION  THIRD ORDER ULTRASOUND GUIDANCE FOR ARTERIAL ACCESS MEDICATIONS: No antibiotics indicated ANESTHESIA/SEDATION: Intravenous Fentanyl and Versed were administered as conscious sedation during continuous monitoring of the patient's level of consciousness and physiological / cardiorespiratory status by the radiology RN, with a total moderate sedation time of 47 minutes. CONTRAST:  Isovue-300 FLUOROSCOPY TIME:  6 minutes, 48 second, 363 mGy COMPLICATIONS: None immediate. PROCEDURE: Informed consent was obtained from the patient following explanation of the procedure, risks, benefits and alternatives. The patient understands, agrees and consents for the procedure. All questions were addressed. A time out was performed prior to the initiation of the procedure. Maximal barrier sterile technique utilized including caps, mask, sterile gowns, sterile gloves, large sterile drape, hand hygiene, and Betadine prep. Right femoral region prepped and draped in usual sterile fashion. Maximal barrier sterile technique was utilized including caps, mask, sterile gowns, sterile gloves, sterile drape, hand hygiene and skin antiseptic. The right common femoral artery was localized under ultrasound. Under real-time ultrasound guidance, the vessel was accessed with a  21-gauge micropuncture needle, exchanged over a 018 guidewire for a transitional dilator, through  which a 035 guidewire was advanced. Over this, a 5 [place] vascular sheath was placed, through which a 5 [place] RIM catheter was advanced and used to selectively catheterize the inferior mesenteric artery for selective arteriography in multiple projections. The previously embolized segment showed continued closure. No active extravasation was identified on non selective and sub selective contrast injection. No additional vascular lesion identified. Normal venous phase. Empiric embolization of the third order branch supplying the bleeding region seen previously was performed using a single 2 mm x 4 cm interlock coil. Follow-up confirmatory arteriogram was obtained. The RIM catheter was then exchanged for a C2, utilized to selectively catheterize the superior mesenteric artery for selective arteriography in multiple projections. No active extravasation or vascular lesion was identified. Venous phase normal. The catheter and sheath were removed and hemostasis achieved with the aid of the Exoseal device after confirmatory femoral arteriography. The patient tolerated the procedure well. IMPRESSION: 1. Technically successful coil embolization of the inferior mesenteric artery third order branch supplying region of active extravasation seen on previous examination. 2. No additional areas of active extravasation or vascular lesion on selective superior and inferior mesenteric arteriography. Electronically Signed   By: Corlis Leak M.D.   On: 03/08/2017 16:31   Ir Angiogram Selective Each Additional Vessel  Result Date: 03/08/2017 INDICATION: Lower GI bleed. Vascular lesion embolized 03/06/2017, with suspicion of intermittent bleeding from additional site. Clinically, patient has had continued lower GI bleeding requiring additional transfusion. EXAM: MESENTERIC ARTERIOGRAM 2 VESSEL ARTERIAL EMBOLIZATION  THIRD ORDER ULTRASOUND  GUIDANCE FOR ARTERIAL ACCESS MEDICATIONS: No antibiotics indicated ANESTHESIA/SEDATION: Intravenous Fentanyl and Versed were administered as conscious sedation during continuous monitoring of the patient's level of consciousness and physiological / cardiorespiratory status by the radiology RN, with a total moderate sedation time of 47 minutes. CONTRAST:  Isovue-300 FLUOROSCOPY TIME:  6 minutes, 48 second, 363 mGy COMPLICATIONS: None immediate. PROCEDURE: Informed consent was obtained from the patient following explanation of the procedure, risks, benefits and alternatives. The patient understands, agrees and consents for the procedure. All questions were addressed. A time out was performed prior to the initiation of the procedure. Maximal barrier sterile technique utilized including caps, mask, sterile gowns, sterile gloves, large sterile drape, hand hygiene, and Betadine prep. Right femoral region prepped and draped in usual sterile fashion. Maximal barrier sterile technique was utilized including caps, mask, sterile gowns, sterile gloves, sterile drape, hand hygiene and skin antiseptic. The right common femoral artery was localized under ultrasound. Under real-time ultrasound guidance, the vessel was accessed with a 21-gauge micropuncture needle, exchanged over a 018 guidewire for a transitional dilator, through which a 035 guidewire was advanced. Over this, a 5 [place] vascular sheath was placed, through which a 5 [place] RIM catheter was advanced and used to selectively catheterize the inferior mesenteric artery for selective arteriography in multiple projections. The previously embolized segment showed continued closure. No active extravasation was identified on non selective and sub selective contrast injection. No additional vascular lesion identified. Normal venous phase. Empiric embolization of the third order branch supplying the bleeding region seen previously was performed using a single 2 mm x 4 cm  interlock coil. Follow-up confirmatory arteriogram was obtained. The RIM catheter was then exchanged for a C2, utilized to selectively catheterize the superior mesenteric artery for selective arteriography in multiple projections. No active extravasation or vascular lesion was identified. Venous phase normal. The catheter and sheath were removed and hemostasis achieved with the aid of the Exoseal device after confirmatory femoral arteriography. The patient  tolerated the procedure well. IMPRESSION: 1. Technically successful coil embolization of the inferior mesenteric artery third order branch supplying region of active extravasation seen on previous examination. 2. No additional areas of active extravasation or vascular lesion on selective superior and inferior mesenteric arteriography. Electronically Signed   By: Corlis Leak M.D.   On: 03/08/2017 16:31   Ir US Guide Vasc Access Right  Result Date: 03/08/2017 INDICATION: Lower GI bleed. Vascular lesion embolized 03/06/2017, with suspicion of intermittent bleeding from additional site. Clinically, patient has had continued lower GI bleeding requiring additional transfusion. EXAM: MESENTERIC ARTERIOGRAM 2 VESSEL ARTERIAL EMBOLIZATION  THIRD ORDER ULTRASOUND GUIDANCE FOR ARTERIAL ACCESS MEDICATIONS: No antibiotics indicated ANESTHESIA/SEDATION: Intravenous Fentanyl and Versed were administered as conscious sedation during continuous monitoring of the patient's level of consciousness and physiological / cardiorespiratory status by the radiology RN, with a total moderate sedation time of 47 minutes. CONTRAST:  Isovue-300 FLUOROSCOPY TIME:  6 minutes, 48 second, 363 mGy COMPLICATIONS: None immediate. PROCEDURE: Informed consent was obtained from the patient following explanation of the procedure, risks, benefits and alternatives. The patient understands, agrees and consents for the procedure. All questions were addressed. A time out was performed prior to the  initiation of the procedure. Maximal barrier sterile technique utilized including caps, mask, sterile gowns, sterile gloves, large sterile drape, hand hygiene, and Betadine prep. Right femoral region prepped and draped in usual sterile fashion. Maximal barrier sterile technique was utilized including caps, mask, sterile gowns, sterile gloves, sterile drape, hand hygiene and skin antiseptic. The right common femoral artery was localized under ultrasound. Under real-time ultrasound guidance, the vessel was accessed with a 21-gauge micropuncture needle, exchanged over a 018 guidewire for a transitional dilator, through which a 035 guidewire was advanced. Over this, a 5 [place] vascular sheath was placed, through which a 5 [place] RIM catheter was advanced and used to selectively catheterize the inferior mesenteric artery for selective arteriography in multiple projections. The previously embolized segment showed continued closure. No active extravasation was identified on non selective and sub selective contrast injection. No additional vascular lesion identified. Normal venous phase. Empiric embolization of the third order branch supplying the bleeding region seen previously was performed using a single 2 mm x 4 cm interlock coil. Follow-up confirmatory arteriogram was obtained. The RIM catheter was then exchanged for a C2, utilized to selectively catheterize the superior mesenteric artery for selective arteriography in multiple projections. No active extravasation or vascular lesion was identified. Venous phase normal. The catheter and sheath were removed and hemostasis achieved with the aid of the Exoseal device after confirmatory femoral arteriography. The patient tolerated the procedure well. IMPRESSION: 1. Technically successful coil embolization of the inferior mesenteric artery third order branch supplying region of active extravasation seen on previous examination. 2. No additional areas of active extravasation  or vascular lesion on selective superior and inferior mesenteric arteriography. Electronically Signed   By: Corlis Leak M.D.   On: 03/08/2017 16:31   Ir Embo Art  Peter Minium Hemorr Lymph [employer] Guide Roadmapping  Result Date: 03/08/2017 INDICATION: Lower GI bleed. Vascular lesion embolized 03/06/2017, with suspicion of intermittent bleeding from additional site. Clinically, patient has had continued lower GI bleeding requiring additional transfusion. EXAM: MESENTERIC ARTERIOGRAM 2 VESSEL ARTERIAL EMBOLIZATION  THIRD ORDER ULTRASOUND GUIDANCE FOR ARTERIAL ACCESS MEDICATIONS: No antibiotics indicated ANESTHESIA/SEDATION: Intravenous Fentanyl and Versed were administered as conscious sedation during continuous monitoring of the patient's level of consciousness and physiological / cardiorespiratory status by the radiology RN, with a total  moderate sedation time of 47 minutes. CONTRAST:  Isovue-300 FLUOROSCOPY TIME:  6 minutes, 48 second, 363 mGy COMPLICATIONS: None immediate. PROCEDURE: Informed consent was obtained from the patient following explanation of the procedure, risks, benefits and alternatives. The patient understands, agrees and consents for the procedure. All questions were addressed. A time out was performed prior to the initiation of the procedure. Maximal barrier sterile technique utilized including caps, mask, sterile gowns, sterile gloves, large sterile drape, hand hygiene, and Betadine prep. Right femoral region prepped and draped in usual sterile fashion. Maximal barrier sterile technique was utilized including caps, mask, sterile gowns, sterile gloves, sterile drape, hand hygiene and skin antiseptic. The right common femoral artery was localized under ultrasound. Under real-time ultrasound guidance, the vessel was accessed with a 21-gauge micropuncture needle, exchanged over a 018 guidewire for a transitional dilator, through which a 035 guidewire was advanced. Over this, a 5 [place] vascular sheath  was placed, through which a 5 [place] RIM catheter was advanced and used to selectively catheterize the inferior mesenteric artery for selective arteriography in multiple projections. The previously embolized segment showed continued closure. No active extravasation was identified on non selective and sub selective contrast injection. No additional vascular lesion identified. Normal venous phase. Empiric embolization of the third order branch supplying the bleeding region seen previously was performed using a single 2 mm x 4 cm interlock coil. Follow-up confirmatory arteriogram was obtained. The RIM catheter was then exchanged for a C2, utilized to selectively catheterize the superior mesenteric artery for selective arteriography in multiple projections. No active extravasation or vascular lesion was identified. Venous phase normal. The catheter and sheath were removed and hemostasis achieved with the aid of the Exoseal device after confirmatory femoral arteriography. The patient tolerated the procedure well. IMPRESSION: 1. Technically successful coil embolization of the inferior mesenteric artery third order branch supplying region of active extravasation seen on previous examination. 2. No additional areas of active extravasation or vascular lesion on selective superior and inferior mesenteric arteriography. Electronically Signed   By: Corlis Leak M.D.   On: 03/08/2017 16:31    Labs:  CBC: Recent Labs    03/07/17 0745  03/09/17 1209 03/09/17 2251 03/10/17 0452 03/11/17 0921  WBC 8.2  --   --  6.3 5.7 6.0  HGB 8.1*   < > 9.0* 9.5* 9.3* 10.2*  HCT 24.4*   < > 26.9* 28.7* 28.2* 30.6*  PLT 143*  --   --  149* 163 185   < > = values in this interval not displayed.    COAGS: No results for input(s): INR, APTT in the last 8760 hours.  BMP: Recent Labs    03/08/17 0236 03/09/17 0613 03/10/17 0452 03/11/17 0921  NA 141 138 142 140  K 3.8 3.8 4.6 3.8  CL 111 111 114* 113*  CO2 23 19* 22 20*    GLUCOSE 98 92 82 86  BUN 17 15 11 8   CALCIUM 7.8* 7.6* 8.1* 8.2*  CREATININE 1.28* 1.13* 1.06* 1.02*  GFRNONAA 35* 41* 44* 47*  GFRAA 41* 48* 52* 54*    LIVER FUNCTION TESTS: Recent Labs    09/28/16 1128 03/06/17 0839 03/07/17 0745 03/09/17 0613  BILITOT 0.6 0.4 0.6 0.6  AST 21 19 17 15   ALT 10* 9* 7* 9*  ALKPHOS 53 43 36* 34*  PROT 6.7 5.6* 4.6* 4.2*  ALBUMIN 3.7 3.0* 2.5* 2.3*    Assessment and Plan: 1. GI bleed, s/p angio embo x2  hgb is stable  today, actually up to 10. Had one bloody BM but this was dark, c/w old blood and not fresh blood. Vitals are stable Further plans per primary service.  Call if needed.  Electronically Signed: Letha Cape 03/11/2017, 12:29 PM   I spent a total of 15 Minutes at the the patient's bedside AND on the patient's hospital floor or unit, greater than 50% of which was counseling/coordinating care for GI bleed

## 2017-03-12 DIAGNOSIS — E7800 Pure hypercholesterolemia, unspecified: Secondary | ICD-10-CM

## 2017-03-12 LAB — BASIC METABOLIC PANEL
Anion gap: 6 (ref 5–15)
BUN: 6 mg/dL (ref 6–20)
CO2: 22 mmol/L (ref 22–32)
Calcium: 8.1 mg/dL — ABNORMAL LOW (ref 8.9–10.3)
Chloride: 112 mmol/L — ABNORMAL HIGH (ref 101–111)
Creatinine, Ser: 1.07 mg/dL — ABNORMAL HIGH (ref 0.44–1.00)
GFR calc Af Amer: 51 mL/min — ABNORMAL LOW (ref >60)
GFR calc non Af Amer: 44 mL/min — ABNORMAL LOW (ref >60)
Glucose, Bld: 91 mg/dL (ref 65–99)
Potassium: 3.7 mmol/L (ref 3.5–5.1)
Sodium: 140 mmol/L (ref 135–145)

## 2017-03-12 LAB — CBC
HCT: 27.6 % — ABNORMAL LOW (ref 36.0–46.0)
Hemoglobin: 9.2 g/dL — ABNORMAL LOW (ref 12.0–15.0)
MCH: 29.8 pg (ref 26.0–34.0)
MCHC: 33.3 g/dL (ref 30.0–36.0)
MCV: 89.3 fL (ref 78.0–100.0)
Platelets: 210 10*3/uL (ref 150–400)
RBC: 3.09 MIL/uL — ABNORMAL LOW (ref 3.87–5.11)
RDW: 16.7 % — ABNORMAL HIGH (ref 11.5–15.5)
WBC: 4.9 10*3/uL (ref 4.0–10.5)

## 2017-03-12 MED ORDER — METOPROLOL TARTRATE 25 MG PO TABS: 12.5000 mg | ORAL_TABLET | Freq: Two times a day (BID) | ORAL | Status: DC

## 2017-03-12 NOTE — Evaluation (Signed)
Physical Therapy Evaluation Patient Details Name: [name] MRN: [id number] DOB: 10/07/1923 Today's Date: 03/12/2017   History of Present Illness  84yo female who developed lower abdominal pain and dark tarry stools, presented to the ED as pain increased where she had 2 bloody stools and dizziness with standing. Diagnosed with acute lower GI bleed, acute blood loss anemia. She received coil embolization L colic artery on 05/16/50, as well as a second embolization of another bleeding source on 03/08/17. PMH anemia, aortic stenosis, OA, hx GI diverticular bleed, HTN, joint pain, LE numbness, peripheral edema, DOE, hx L anterior THA 2017  Clinical Impression    Patient received in bed, very pleasant and reporting she is doing well this afternoon. She is able to complete all functional bed mobility with Mod(I), and requires only S for functional transfers and gait this afternoon; able to ambulate approximately 166f with RW today with some reports of knee pain but this is not a limiting factor for mobility per patient. While she does not appear to be in need of skilled acute PT services during this hospital stay, she may benefit from skilled HHPT services moving forward to ensure safety with mobility in the home moving forward. She was left in bed with all needs met and questions/concerns addressed this afternoon. PT signing off for now, thank you for the referral.     Follow Up Recommendations Home health PT    Equipment Recommendations  None recommended by PT    Recommendations for Other Services       Precautions / Restrictions Precautions Precautions: None Restrictions Weight Bearing Restrictions: No      Mobility  Bed Mobility Overal bed mobility: Modified Independent             General bed mobility comments: increased time   Transfers Overall transfer level: Needs assistance Equipment used: Rolling walker (2 wheeled) Transfers: Sit to/from Stand Sit to Stand:  Supervision         General transfer comment: S for safety   Ambulation/Gait Ambulation/Gait assistance: Supervision Ambulation Distance (Feet): 180 Feet Assistive device: Rolling walker (2 wheeled) Gait Pattern/deviations: Step-through pattern;Decreased step length - right;Decreased step length - left;Trunk flexed     General Gait Details: appears steady at self-selected pace, reports knee pain however states this is not a limiting factor for gait   Stairs            Wheelchair Mobility    Modified Rankin (Stroke Patients Only)       Balance Overall balance assessment: No apparent balance deficits (not formally assessed)                                           Pertinent Vitals/Pain Pain Assessment: No/denies pain    Home Living Family/patient expects to be discharged to:: Private residence Living Arrangements: Alone Available Help at Discharge: Family;Available PRN/intermittently Type of Home: House Home Access: Stairs to enter Entrance Stairs-Rails: Can reach both;Left;Right Entrance Stairs-Number of Steps: 2 Home Layout: One level Home Equipment: Walker - 2 wheels;Shower seat      Prior Function Level of Independence: Independent with assistive device(s)               Hand Dominance        Extremity/Trunk Assessment   Upper Extremity Assessment Upper Extremity Assessment: Overall WFL for tasks assessed    Lower Extremity Assessment Lower  Extremity Assessment: Overall WFL for tasks assessed    Cervical / Trunk Assessment Cervical / Trunk Assessment: Kyphotic  Communication   Communication: No difficulties  Cognition Arousal/Alertness: Awake/alert Behavior During Therapy: WFL for tasks assessed/performed Overall Cognitive Status: Within Functional Limits for tasks assessed                                        General Comments      Exercises     Assessment/Plan    PT Assessment All further  PT needs can be met in the next venue of care  PT Problem List Decreased mobility;Decreased safety awareness;Decreased coordination;Pain       PT Treatment Interventions      PT Goals (Current goals can be found in the Care Plan section)  Acute Rehab PT Goals Patient Stated Goal: to go home PT Goal Formulation: With patient Time For Goal Achievement: 03/19/17 Potential to Achieve Goals: Good    Frequency     Barriers to discharge        Co-evaluation               AM-PAC PT "6 Clicks" Daily Activity  Outcome Measure Difficulty turning over in bed (including adjusting bedclothes, sheets and blankets)?: None Difficulty moving from lying on back to sitting on the side of the bed? : None Difficulty sitting down on and standing up from a chair with arms (e.g., wheelchair, bedside commode, etc,.)?: None Help needed moving to and from a bed to chair (including a wheelchair)?: None Help needed walking in hospital room?: A Little Help needed climbing 3-5 steps with a railing? : A Little 6 Click Score: 22    End of Session   Activity Tolerance: Patient tolerated treatment well Patient left: in bed;with call bell/phone within reach   PT Visit Diagnosis: Muscle weakness (generalized) (M62.81);Difficulty in walking, not elsewhere classified (R26.2);Pain Pain - Right/Left: Left Pain - part of body: Knee    Time: 1314-3888 PT Time Calculation (min) (ACUTE ONLY): 8 min   Charges:   PT Evaluation $PT Eval Low Complexity: 1 Low     PT G Codes:        Deniece Ree PT, DPT, CBIS  Supplemental Physical Therapist [place]   Pager [phone]

## 2017-03-12 NOTE — Progress Notes (Signed)
Patient given discharge instructions. Patient verbalized understanding of medication changes and follow up appointments. Patient left unit in stable condition via wheelchair with nursing staff.

## 2017-03-12 NOTE — Care Management Note (Addendum)
Case Management Note  Patient Details  Name: [name] MRN: [id number] Date of Birth: 01/25/1924  Subjective/Objective:                    Action/Plan: Patient has transportation home   Expected Discharge Date:  03/12/17               Expected Discharge Plan:  Home w Home Health Services  In-House Referral:     Discharge planning Services  CM Consult  Post Acute Care Choice:  Home Health Choice offered to:  Patient  DME Arranged:  N/A DME Agency:  NA  HH Arranged:  PT HH Agency:  [facility]  Status of Service:  Completed, signed off  If discussed at Long Length of Stay Meetings, dates discussed:    Additional Comments:  Kingsley PlanWile, Brenda Blethen Marie, RN 03/12/2017, 4:39 PM

## 2017-03-12 NOTE — Discharge Summary (Signed)
Physician Discharge Summary  [name] ZOX:[id number] DOB: October 11, 1923 DOA: 03/06/2017  PCP: Fleet Contras, MD  Admit date: 03/06/2017 Discharge date: 03/12/2017  Admitted From: Home Disposition: Home   Recommendations for Outpatient Follow-up:  1. Follow up with PCP in 1-2 weeks 2. Holding aspirin for GI bleeding pending follow up with PCP.  3. Monitor HR and BP, restarted lower dose of metoprolol and home dose losartan and HCTZ.   Home Health: PT Equipment/Devices: None new Discharge Condition: Stable CODE STATUS: Full Diet recommendation: As tolerated  Brief/Interim Summary: [name] is a 84 y.o. female with a history of diverticulosis with recurrent GI bleeding, aortic stenosis, HTN on ASA who presented for lower abdominal cramping and blood in stool. Hgb noted to be 8.7 (previously ~10), 2u PRBCs provided and bleeding scan was performed showing bleed at splenic flexure. Subsequent angiogram with coil embolization of actively bleeding branch of the left colic artery was performed in IR 1/23 and repeated with a different branch 1/25. 1u PRBCs given 1/25 and again 1/26. Bleeding persisted, so repeat tagged RBC scan is requested, was negative. Bleeding has subsided, so patient transferred to medical floor 1/28. No further bleeding noted, so patient will be discharged home, holding aspirin until follow up.  Discharge Diagnoses:  Principal Problem:   Acute lower GI bleeding Active Problems:   Diverticula, colon   HTN (hypertension)   High cholesterol   Acute kidney injury (HCC)   Acute blood loss anemia  Diverticulosis with acute lower GI bleeding and acute blood loss anemia on chronic iron-deficiency anemia:  - Underwent coil embolization of left colic artery 1/23 and repeat angio with embolization of another source of bleeding 1/25.  - Last transfusion 1/26, hgb stable, bleeding slowing, negative tagged scan 1/27. Not candidate for invasive GI evaluation. - Tolerating  advanced diet - Check CBC at follow up.   Thrombocytopenia: Mild.  - Holding ASA, will continue to monitor at follow up.  AKI on stage III CKD: Cr improved with fluids, transfusions to baseline ~1-1.2.  - BMP at follow up.   HTN:  - Will restart lower dose metoprolol, will restart HCTZ, losartan.  Hypercholesterolemia: Intolerant to multiple medications.   Discharge Instructions Discharge Instructions    Diet - low sodium heart healthy   Complete by:  As directed    Discharge instructions   Complete by:  As directed    You are stable for discharge, but will need to follow up with your PCP in the next 1 - 2 weeks or seek medical attention sooner if you start bleeding again.  - Cut metoprolol in half, take only 12.5mg  twice daily - Stop taking aspirin until you follow up with your PCP - You will get home health physical therapy services   Increase activity slowly   Complete by:  As directed      Allergies as of 03/12/2017      Reactions   Ezetimibe Anaphylaxis   Lipitor [atorvastatin] Swelling   Pravachol [pravastatin] Swelling   Statins Swelling, Other (See Comments)   Swelling of mouth and lips   Zocor [simvastatin] Swelling   Cholestyramine Nausea And Vomiting      Medication List    STOP taking these medications   aspirin EC 81 MG tablet     TAKE these medications   acetaminophen 325 MG tablet Commonly known as:  TYLENOL Take 325 mg by mouth every 6 (six) hours as needed. pain   diclofenac sodium 1 % Gel Commonly known  as:  VOLTAREN Apply 2 g topically 2 (two) times daily.   famotidine 40 MG tablet Commonly known as:  PEPCID Take 40 mg by mouth daily.   ferrous sulfate 325 (65 FE) MG tablet Take 325 mg by mouth daily with breakfast.   furosemide 40 MG tablet Commonly known as:  LASIX Take 40 mg by mouth daily.   losartan-hydrochlorothiazide 100-12.5 MG tablet Commonly known as:  HYZAAR Take 1 tablet by mouth daily.   metoprolol tartrate 25 MG  tablet Commonly known as:  LOPRESSOR Take 0.5 tablets (12.5 mg total) by mouth 2 (two) times daily. What changed:  how much to take   multivitamin with minerals Tabs tablet Take 1 tablet by mouth daily.   nitroGLYCERIN 0.4 MG SL tablet Commonly known as:  NITROSTAT Place 0.4 mg under the tongue every 5 (five) minutes as needed for chest pain.   polyethylene glycol powder powder Commonly known as:  GLYCOLAX/MIRALAX Take 17 g by mouth daily as needed for mild constipation.   PROAIR HFA 108 (90 Base) MCG/ACT inhaler Generic drug:  albuterol Inhale 1-2 puffs into the lungs every 4 (four) hours as needed for shortness of breath or wheezing.   Vitamin D3 2000 units Tabs Take 2,000 Units by mouth daily.      Follow-up Information    Fleet Contras, MD. Schedule an appointment as soon as possible for a visit.   Specialty:  Internal Medicine Contact information: [address] [place] [address] [phone]          Allergies  Allergen Reactions  . Ezetimibe Anaphylaxis  . Lipitor [Atorvastatin] Swelling  . Pravachol [Pravastatin] Swelling  . Statins Swelling and Other (See Comments)    Swelling of mouth and lips  . Zocor [Simvastatin] Swelling  . Cholestyramine Nausea And Vomiting    Consultations:  GI  IR  Procedures/Studies: Nm Gi Blood Loss  Result Date: 03/10/2017 CLINICAL DATA:  GI bleeding. EXAM: NUCLEAR MEDICINE GASTROINTESTINAL BLEEDING SCAN TECHNIQUE: Sequential abdominal images were obtained following intravenous administration of Tc-85m labeled red blood cells. RADIOPHARMACEUTICALS:  27.4 mCi Tc-44m pertechnetate in-vitro labeled red cells. COMPARISON:  03/06/2017 FINDINGS: No evidence extravascular radiotracer accumulation to suggest active GI bleeding. IMPRESSION: Negative study for active GI bleeding. Electronically Signed   By: Kennith Center M.D.   On: 03/10/2017 12:01   Nm Gi Blood Loss  Result Date: 03/06/2017 CLINICAL DATA:  84 year old  female with GI bleeding, anemia. EXAM: NUCLEAR MEDICINE GASTROINTESTINAL BLEEDING SCAN TECHNIQUE: Sequential abdominal images were obtained following intravenous administration of Tc-20m labeled red blood cells. RADIOPHARMACEUTICALS:  25.4 mCi Tc-34m pertechnetate in-vitro labeled red cells. COMPARISON:  CT Abdomen and Pelvis 03/08/2015 FINDINGS: Physiologic blood pool activity is present, but almost immediately there is abnormal radiotracer activity within bowel loops of the left abdomen. This appears to originate at the splenic flexure, and then transit is in both the distal transverse colon and the descending colon to the sigmoid region. Widespread diverticulosis in the large bowel on the comparison CT Abdomen and Pelvis. IMPRESSION: Positive for active GI bleeding. Bleeding origin appears to be the splenic flexure of colon. Critical Value/emergent results were called by telephone at the time of interpretation on 03/06/2017 at 1347 hr to Dr. Luberta Robertson in the ED, who verbally acknowledged these results. Electronically Signed   By: Odessa Fleming M.D.   On: 03/06/2017 14:04   Ir Angiogram Visceral Selective  Result Date: 03/08/2017 INDICATION: Lower GI bleed. Vascular lesion embolized 03/06/2017, with suspicion of intermittent bleeding from additional  site. Clinically, patient has had continued lower GI bleeding requiring additional transfusion. EXAM: MESENTERIC ARTERIOGRAM 2 VESSEL ARTERIAL EMBOLIZATION  THIRD ORDER ULTRASOUND GUIDANCE FOR ARTERIAL ACCESS MEDICATIONS: No antibiotics indicated ANESTHESIA/SEDATION: Intravenous Fentanyl and Versed were administered as conscious sedation during continuous monitoring of the patient's level of consciousness and physiological / cardiorespiratory status by the radiology RN, with a total moderate sedation time of 47 minutes. CONTRAST:  Isovue-300 FLUOROSCOPY TIME:  6 minutes, 48 second, 363 mGy COMPLICATIONS: None immediate. PROCEDURE: Informed consent was obtained from the  patient following explanation of the procedure, risks, benefits and alternatives. The patient understands, agrees and consents for the procedure. All questions were addressed. A time out was performed prior to the initiation of the procedure. Maximal barrier sterile technique utilized including caps, mask, sterile gowns, sterile gloves, large sterile drape, hand hygiene, and Betadine prep. Right femoral region prepped and draped in usual sterile fashion. Maximal barrier sterile technique was utilized including caps, mask, sterile gowns, sterile gloves, sterile drape, hand hygiene and skin antiseptic. The right common femoral artery was localized under ultrasound. Under real-time ultrasound guidance, the vessel was accessed with a 21-gauge micropuncture needle, exchanged over a 018 guidewire for a transitional dilator, through which a 035 guidewire was advanced. Over this, a 5 [place] vascular sheath was placed, through which a 5 [place] RIM catheter was advanced and used to selectively catheterize the inferior mesenteric artery for selective arteriography in multiple projections. The previously embolized segment showed continued closure. No active extravasation was identified on non selective and sub selective contrast injection. No additional vascular lesion identified. Normal venous phase. Empiric embolization of the third order branch supplying the bleeding region seen previously was performed using a single 2 mm x 4 cm interlock coil. Follow-up confirmatory arteriogram was obtained. The RIM catheter was then exchanged for a C2, utilized to selectively catheterize the superior mesenteric artery for selective arteriography in multiple projections. No active extravasation or vascular lesion was identified. Venous phase normal. The catheter and sheath were removed and hemostasis achieved with the aid of the Exoseal device after confirmatory femoral arteriography. The patient tolerated the procedure well. IMPRESSION:  1. Technically successful coil embolization of the inferior mesenteric artery third order branch supplying region of active extravasation seen on previous examination. 2. No additional areas of active extravasation or vascular lesion on selective superior and inferior mesenteric arteriography. Electronically Signed   By: Corlis Leak M.D.   On: 03/08/2017 16:31   Ir Angiogram Visceral Selective  Result Date: 03/08/2017 INDICATION: Lower GI bleed. Vascular lesion embolized 03/06/2017, with suspicion of intermittent bleeding from additional site. Clinically, patient has had continued lower GI bleeding requiring additional transfusion. EXAM: MESENTERIC ARTERIOGRAM 2 VESSEL ARTERIAL EMBOLIZATION  THIRD ORDER ULTRASOUND GUIDANCE FOR ARTERIAL ACCESS MEDICATIONS: No antibiotics indicated ANESTHESIA/SEDATION: Intravenous Fentanyl and Versed were administered as conscious sedation during continuous monitoring of the patient's level of consciousness and physiological / cardiorespiratory status by the radiology RN, with a total moderate sedation time of 47 minutes. CONTRAST:  Isovue-300 FLUOROSCOPY TIME:  6 minutes, 48 second, 363 mGy COMPLICATIONS: None immediate. PROCEDURE: Informed consent was obtained from the patient following explanation of the procedure, risks, benefits and alternatives. The patient understands, agrees and consents for the procedure. All questions were addressed. A time out was performed prior to the initiation of the procedure. Maximal barrier sterile technique utilized including caps, mask, sterile gowns, sterile gloves, large sterile drape, hand hygiene, and Betadine prep. Right femoral region prepped and draped in usual sterile fashion.  Maximal barrier sterile technique was utilized including caps, mask, sterile gowns, sterile gloves, sterile drape, hand hygiene and skin antiseptic. The right common femoral artery was localized under ultrasound. Under real-time ultrasound guidance, the vessel was  accessed with a 21-gauge micropuncture needle, exchanged over a 018 guidewire for a transitional dilator, through which a 035 guidewire was advanced. Over this, a 5 [place] vascular sheath was placed, through which a 5 [place] RIM catheter was advanced and used to selectively catheterize the inferior mesenteric artery for selective arteriography in multiple projections. The previously embolized segment showed continued closure. No active extravasation was identified on non selective and sub selective contrast injection. No additional vascular lesion identified. Normal venous phase. Empiric embolization of the third order branch supplying the bleeding region seen previously was performed using a single 2 mm x 4 cm interlock coil. Follow-up confirmatory arteriogram was obtained. The RIM catheter was then exchanged for a C2, utilized to selectively catheterize the superior mesenteric artery for selective arteriography in multiple projections. No active extravasation or vascular lesion was identified. Venous phase normal. The catheter and sheath were removed and hemostasis achieved with the aid of the Exoseal device after confirmatory femoral arteriography. The patient tolerated the procedure well. IMPRESSION: 1. Technically successful coil embolization of the inferior mesenteric artery third order branch supplying region of active extravasation seen on previous examination. 2. No additional areas of active extravasation or vascular lesion on selective superior and inferior mesenteric arteriography. Electronically Signed   By: Corlis Leak M.D.   On: 03/08/2017 16:31   Ir Angiogram Visceral Selective  Result Date: 03/07/2017 INDICATION: 84 year old female with acute lower GI bleed with the site of bleeding localized to the region of the splenic flexure on tagged RBC scan. She presents for visceral angiography and possible embolization. EXAM: IR EMBO ARTERIAL NOT HEMORR HEMANG INC GUIDE ROADMAPPING; SELECTIVE VISCERAL  ARTERIOGRAPHY; ADDITIONAL ARTERIOGRAPHY; IR ULTRASOUND GUIDANCE VASC ACCESS RIGHT 1. Ultrasound-guided vascular access, right common femoral artery 2. Celiac artery catheterization and angiogram (first order) 3. Superior mesenteric artery catheterization and angiogram (first order) 4. Inferior mesenteric artery catheterization and angiogram with additional sub selective arteriography as follows 5. Left colic artery catheterization and angiogram (second order) 6. Ascending branch of the left colic artery catheterization and angiogram (third order) 7. Marginal artery catheterization and angiogram (third order, additional after basic) 8. Coil embolization of marginal artery 9. Catheterization of the descending branch of the left colic artery with angiogram (third order, additional after basic) 10. Catheterization of a distal branch of the ascending branch of the left colic artery with angiogram (third order, additional after basic) MEDICATIONS: None. ANESTHESIA/SEDATION: Moderate (conscious) sedation was employed during this procedure. A total of Versed 1.5 mg and Fentanyl 50 mcg was administered intravenously. Moderate Sedation Time: 44 minutes. The patient's level of consciousness and vital signs were monitored continuously by radiology nursing throughout the procedure under my direct supervision. CONTRAST:  Approximately 75 mL Isovue 300 FLUOROSCOPY TIME:  Fluoroscopy Time: 13 minutes 24 seconds (907 mGy). COMPLICATIONS: None immediate. PROCEDURE: Informed consent was obtained from the patient following explanation of the procedure, risks, benefits and alternatives. The patient understands, agrees and consents for the procedure. All questions were addressed. A time out was performed prior to the initiation of the procedure. Maximal barrier sterile technique utilized including caps, mask, sterile gowns, sterile gloves, large sterile drape, hand hygiene, and Betadine prep. The right common femoral artery was  interrogated with ultrasound and found to be widely patent. An image was obtained  and stored for the medical record. Local anesthesia was attained by infiltration with 1% lidocaine. A small dermatotomy was made. Under real-time sonographic guidance, the vessel was punctured with a 21 gauge micropuncture needle. Using standard technique, the initial micro needle was exchanged over a 0.018 micro wire for a transitional 4 [place] micro sheath. The micro sheath was then exchanged over a 0.035 wire for a 5 French vascular sheath. A Bentson wire was advanced in the abdominal aorta. A rim catheter was advanced over the wire. The rim catheter was used to select the inferior mesenteric artery. An arteriogram was performed. There is early nonvascular extravasation of contrast material arising from what appears to be the marginal artery at the splenic flexure. This corresponds with the site of bleeding seen on the recent tagged red blood cell study. Interestingly, there is a second site of apparent extravasation arising from a distal visceral branch of the ascending and division of the left colic artery in the proximal descending colon. This extravasation is less robust than the region of extravasation in the marginal artery. A renegade ST microcatheter was then advanced over a Fathom 16 wire in used to select the left colic artery. A left colic arteriogram was performed. The anatomy of the ascending and descending branches was successfully identified. There is persistent contrast extravasation from the marginal artery. No definite contrast extravasation noted on this second angiogram in the region of the descending colon. The catheter was further advanced into the ascending branch of the left colic artery. Arteriography was performed confirming continued abnormal extravasation of contrast from the marginal artery. This has an ovoid appearance and may represent pooling within a dependent diverticulum versus fusiform aneurysmal  dilatation. The microcatheter was successfully advanced into the marginal artery. Contrast demonstrates frank extravasation. Coil embolization was performed using a series of 2 and 3 mm fibered detachable interlock coils. Post embolization arteriography confirms complete occlusion of this vessel with no further contrast extravasation. No collateral filling. The microcatheter was brought back into the left colic artery and advanced into the descending division of the left colic artery to further evaluate the other potential site of bleeding. Arteriography was performed. No evidence of bleeding in the proximal descending colon. The microcatheter was readvanced into the ascending branch and out into the distal visceral branch that appeared to be the site of bleeding on the initial angiogram. Arteriography was performed and carried out to the venous phase. Again, there is no evidence of bleeding at this site. No definite vessel abnormality was identified. No embolization was performed at this site. The microcatheter and rim catheter were removed. A C2 cobra catheter was advanced over a Bentson wire into the abdominal aorta and used to select the celiac artery. A celiac arteriogram was performed. Normal arterial anatomy. No evidence of replaced middle colic artery. No active bleeding. The superior mesenteric artery was then catheterized. Arteriography was performed. No evidence of back bleeding from the middle colic artery where it anastomosis with the left colic artery. No additional sites of bleeding were identified. The catheter was removed. A limited right common femoral arteriogram was performed. Hemostasis was attained with the assistance of an Angio-Seal device. IMPRESSION: 1. Positive angiography with potentially 2 sites of active bleeding in the region of the splenic flexure and proximal descending colon. 2. Successful coil embolization of the more definitive site of bleeding. 3. The second potential site of  bleeding could not be identified on subsequent angiography and may have been artifactual. PLAN: 1. Patient is at  risk for contrast induced nephropathy given baseline stage 3 chronic kidney disease. Recommend trending BUN and creatinine over the next 48 hours. Keep patient well hydrated. 2. Continue to trend H and H and transfuse as needed. 3. If there is evidence of recurrent or persistent active lower GI bleeding, repeat angiography should be considered. Signed, Sterling Big, MD [facility] Electronically Signed   By: Malachy Moan M.D.   On: 03/07/2017 09:37   Ir Angiogram Visceral Selective  Result Date: 03/07/2017 INDICATION: 84 year old female with acute lower GI bleed with the site of bleeding localized to the region of the splenic flexure on tagged RBC scan. She presents for visceral angiography and possible embolization. EXAM: IR EMBO ARTERIAL NOT HEMORR HEMANG INC GUIDE ROADMAPPING; SELECTIVE VISCERAL ARTERIOGRAPHY; ADDITIONAL ARTERIOGRAPHY; IR ULTRASOUND GUIDANCE VASC ACCESS RIGHT 1. Ultrasound-guided vascular access, right common femoral artery 2. Celiac artery catheterization and angiogram (first order) 3. Superior mesenteric artery catheterization and angiogram (first order) 4. Inferior mesenteric artery catheterization and angiogram with additional sub selective arteriography as follows 5. Left colic artery catheterization and angiogram (second order) 6. Ascending branch of the left colic artery catheterization and angiogram (third order) 7. Marginal artery catheterization and angiogram (third order, additional after basic) 8. Coil embolization of marginal artery 9. Catheterization of the descending branch of the left colic artery with angiogram (third order, additional after basic) 10. Catheterization of a distal branch of the ascending branch of the left colic artery with angiogram (third order, additional after basic)  MEDICATIONS: None. ANESTHESIA/SEDATION: Moderate (conscious) sedation was employed during this procedure. A total of Versed 1.5 mg and Fentanyl 50 mcg was administered intravenously. Moderate Sedation Time: 44 minutes. The patient's level of consciousness and vital signs were monitored continuously by radiology nursing throughout the procedure under my direct supervision. CONTRAST:  Approximately 75 mL Isovue 300 FLUOROSCOPY TIME:  Fluoroscopy Time: 13 minutes 24 seconds (907 mGy). COMPLICATIONS: None immediate. PROCEDURE: Informed consent was obtained from the patient following explanation of the procedure, risks, benefits and alternatives. The patient understands, agrees and consents for the procedure. All questions were addressed. A time out was performed prior to the initiation of the procedure. Maximal barrier sterile technique utilized including caps, mask, sterile gowns, sterile gloves, large sterile drape, hand hygiene, and Betadine prep. The right common femoral artery was interrogated with ultrasound and found to be widely patent. An image was obtained and stored for the medical record. Local anesthesia was attained by infiltration with 1% lidocaine. A small dermatotomy was made. Under real-time sonographic guidance, the vessel was punctured with a 21 gauge micropuncture needle. Using standard technique, the initial micro needle was exchanged over a 0.018 micro wire for a transitional 4 [place] micro sheath. The micro sheath was then exchanged over a 0.035 wire for a 5 French vascular sheath. A Bentson wire was advanced in the abdominal aorta. A rim catheter was advanced over the wire. The rim catheter was used to select the inferior mesenteric artery. An arteriogram was performed. There is early nonvascular extravasation of contrast material arising from what appears to be the marginal artery at the splenic flexure. This corresponds with the site of bleeding seen on the recent tagged red blood cell study.  Interestingly, there is a second site of apparent extravasation arising from a distal visceral branch of the ascending and division of the left colic artery in the proximal descending colon. This extravasation is less robust than the region of extravasation in the marginal  artery. A renegade ST microcatheter was then advanced over a Fathom 16 wire in used to select the left colic artery. A left colic arteriogram was performed. The anatomy of the ascending and descending branches was successfully identified. There is persistent contrast extravasation from the marginal artery. No definite contrast extravasation noted on this second angiogram in the region of the descending colon. The catheter was further advanced into the ascending branch of the left colic artery. Arteriography was performed confirming continued abnormal extravasation of contrast from the marginal artery. This has an ovoid appearance and may represent pooling within a dependent diverticulum versus fusiform aneurysmal dilatation. The microcatheter was successfully advanced into the marginal artery. Contrast demonstrates frank extravasation. Coil embolization was performed using a series of 2 and 3 mm fibered detachable interlock coils. Post embolization arteriography confirms complete occlusion of this vessel with no further contrast extravasation. No collateral filling. The microcatheter was brought back into the left colic artery and advanced into the descending division of the left colic artery to further evaluate the other potential site of bleeding. Arteriography was performed. No evidence of bleeding in the proximal descending colon. The microcatheter was readvanced into the ascending branch and out into the distal visceral branch that appeared to be the site of bleeding on the initial angiogram. Arteriography was performed and carried out to the venous phase. Again, there is no evidence of bleeding at this site. No definite vessel abnormality  was identified. No embolization was performed at this site. The microcatheter and rim catheter were removed. A C2 cobra catheter was advanced over a Bentson wire into the abdominal aorta and used to select the celiac artery. A celiac arteriogram was performed. Normal arterial anatomy. No evidence of replaced middle colic artery. No active bleeding. The superior mesenteric artery was then catheterized. Arteriography was performed. No evidence of back bleeding from the middle colic artery where it anastomosis with the left colic artery. No additional sites of bleeding were identified. The catheter was removed. A limited right common femoral arteriogram was performed. Hemostasis was attained with the assistance of an Angio-Seal device. IMPRESSION: 1. Positive angiography with potentially 2 sites of active bleeding in the region of the splenic flexure and proximal descending colon. 2. Successful coil embolization of the more definitive site of bleeding. 3. The second potential site of bleeding could not be identified on subsequent angiography and may have been artifactual. PLAN: 1. Patient is at risk for contrast induced nephropathy given baseline stage 3 chronic kidney disease. Recommend trending BUN and creatinine over the next 48 hours. Keep patient well hydrated. 2. Continue to trend H and H and transfuse as needed. 3. If there is evidence of recurrent or persistent active lower GI bleeding, repeat angiography should be considered. Signed, Sterling Big, MD [facility] Radiology Electronically Signed   By: Malachy Moan M.D.   On: 03/07/2017 09:37   Ir Angiogram Visceral Selective  Result Date: 03/07/2017 INDICATION: 84 year old female with acute lower GI bleed with the site of bleeding localized to the region of the splenic flexure on tagged RBC scan. She presents for visceral angiography and possible embolization. EXAM: IR EMBO ARTERIAL NOT HEMORR  HEMANG INC GUIDE ROADMAPPING; SELECTIVE VISCERAL ARTERIOGRAPHY; ADDITIONAL ARTERIOGRAPHY; IR ULTRASOUND GUIDANCE VASC ACCESS RIGHT 1. Ultrasound-guided vascular access, right common femoral artery 2. Celiac artery catheterization and angiogram (first order) 3. Superior mesenteric artery catheterization and angiogram (first order) 4. Inferior mesenteric artery catheterization and angiogram with additional sub selective arteriography as  follows 5. Left colic artery catheterization and angiogram (second order) 6. Ascending branch of the left colic artery catheterization and angiogram (third order) 7. Marginal artery catheterization and angiogram (third order, additional after basic) 8. Coil embolization of marginal artery 9. Catheterization of the descending branch of the left colic artery with angiogram (third order, additional after basic) 10. Catheterization of a distal branch of the ascending branch of the left colic artery with angiogram (third order, additional after basic) MEDICATIONS: None. ANESTHESIA/SEDATION: Moderate (conscious) sedation was employed during this procedure. A total of Versed 1.5 mg and Fentanyl 50 mcg was administered intravenously. Moderate Sedation Time: 44 minutes. The patient's level of consciousness and vital signs were monitored continuously by radiology nursing throughout the procedure under my direct supervision. CONTRAST:  Approximately 75 mL Isovue 300 FLUOROSCOPY TIME:  Fluoroscopy Time: 13 minutes 24 seconds (907 mGy). COMPLICATIONS: None immediate. PROCEDURE: Informed consent was obtained from the patient following explanation of the procedure, risks, benefits and alternatives. The patient understands, agrees and consents for the procedure. All questions were addressed. A time out was performed prior to the initiation of the procedure. Maximal barrier sterile technique utilized including caps, mask, sterile gowns, sterile gloves, large sterile drape, hand hygiene, and Betadine  prep. The right common femoral artery was interrogated with ultrasound and found to be widely patent. An image was obtained and stored for the medical record. Local anesthesia was attained by infiltration with 1% lidocaine. A small dermatotomy was made. Under real-time sonographic guidance, the vessel was punctured with a 21 gauge micropuncture needle. Using standard technique, the initial micro needle was exchanged over a 0.018 micro wire for a transitional 4 [place] micro sheath. The micro sheath was then exchanged over a 0.035 wire for a 5 French vascular sheath. A Bentson wire was advanced in the abdominal aorta. A rim catheter was advanced over the wire. The rim catheter was used to select the inferior mesenteric artery. An arteriogram was performed. There is early nonvascular extravasation of contrast material arising from what appears to be the marginal artery at the splenic flexure. This corresponds with the site of bleeding seen on the recent tagged red blood cell study. Interestingly, there is a second site of apparent extravasation arising from a distal visceral branch of the ascending and division of the left colic artery in the proximal descending colon. This extravasation is less robust than the region of extravasation in the marginal artery. A renegade ST microcatheter was then advanced over a Fathom 16 wire in used to select the left colic artery. A left colic arteriogram was performed. The anatomy of the ascending and descending branches was successfully identified. There is persistent contrast extravasation from the marginal artery. No definite contrast extravasation noted on this second angiogram in the region of the descending colon. The catheter was further advanced into the ascending branch of the left colic artery. Arteriography was performed confirming continued abnormal extravasation of contrast from the marginal artery. This has an ovoid appearance and may represent pooling within a  dependent diverticulum versus fusiform aneurysmal dilatation. The microcatheter was successfully advanced into the marginal artery. Contrast demonstrates frank extravasation. Coil embolization was performed using a series of 2 and 3 mm fibered detachable interlock coils. Post embolization arteriography confirms complete occlusion of this vessel with no further contrast extravasation. No collateral filling. The microcatheter was brought back into the left colic artery and advanced into the descending division of the left colic artery to further evaluate the other potential site of bleeding.  Arteriography was performed. No evidence of bleeding in the proximal descending colon. The microcatheter was readvanced into the ascending branch and out into the distal visceral branch that appeared to be the site of bleeding on the initial angiogram. Arteriography was performed and carried out to the venous phase. Again, there is no evidence of bleeding at this site. No definite vessel abnormality was identified. No embolization was performed at this site. The microcatheter and rim catheter were removed. A C2 cobra catheter was advanced over a Bentson wire into the abdominal aorta and used to select the celiac artery. A celiac arteriogram was performed. Normal arterial anatomy. No evidence of replaced middle colic artery. No active bleeding. The superior mesenteric artery was then catheterized. Arteriography was performed. No evidence of back bleeding from the middle colic artery where it anastomosis with the left colic artery. No additional sites of bleeding were identified. The catheter was removed. A limited right common femoral arteriogram was performed. Hemostasis was attained with the assistance of an Angio-Seal device. IMPRESSION: 1. Positive angiography with potentially 2 sites of active bleeding in the region of the splenic flexure and proximal descending colon. 2. Successful coil embolization of the more definitive  site of bleeding. 3. The second potential site of bleeding could not be identified on subsequent angiography and may have been artifactual. PLAN: 1. Patient is at risk for contrast induced nephropathy given baseline stage 3 chronic kidney disease. Recommend trending BUN and creatinine over the next 48 hours. Keep patient well hydrated. 2. Continue to trend H and H and transfuse as needed. 3. If there is evidence of recurrent or persistent active lower GI bleeding, repeat angiography should be considered. Signed, Sterling Big, MD [facility] Radiology Electronically Signed   By: Malachy Moan M.D.   On: 03/07/2017 09:37   Ir Angiogram Selective Each Additional Vessel  Result Date: 03/08/2017 INDICATION: Lower GI bleed. Vascular lesion embolized 03/06/2017, with suspicion of intermittent bleeding from additional site. Clinically, patient has had continued lower GI bleeding requiring additional transfusion. EXAM: MESENTERIC ARTERIOGRAM 2 VESSEL ARTERIAL EMBOLIZATION  THIRD ORDER ULTRASOUND GUIDANCE FOR ARTERIAL ACCESS MEDICATIONS: No antibiotics indicated ANESTHESIA/SEDATION: Intravenous Fentanyl and Versed were administered as conscious sedation during continuous monitoring of the patient's level of consciousness and physiological / cardiorespiratory status by the radiology RN, with a total moderate sedation time of 47 minutes. CONTRAST:  Isovue-300 FLUOROSCOPY TIME:  6 minutes, 48 second, 363 mGy COMPLICATIONS: None immediate. PROCEDURE: Informed consent was obtained from the patient following explanation of the procedure, risks, benefits and alternatives. The patient understands, agrees and consents for the procedure. All questions were addressed. A time out was performed prior to the initiation of the procedure. Maximal barrier sterile technique utilized including caps, mask, sterile gowns, sterile gloves, large sterile drape, hand hygiene, and  Betadine prep. Right femoral region prepped and draped in usual sterile fashion. Maximal barrier sterile technique was utilized including caps, mask, sterile gowns, sterile gloves, sterile drape, hand hygiene and skin antiseptic. The right common femoral artery was localized under ultrasound. Under real-time ultrasound guidance, the vessel was accessed with a 21-gauge micropuncture needle, exchanged over a 018 guidewire for a transitional dilator, through which a 035 guidewire was advanced. Over this, a 5 [place] vascular sheath was placed, through which a 5 [place] RIM catheter was advanced and used to selectively catheterize the inferior mesenteric artery for selective arteriography in multiple projections. The previously embolized segment showed continued closure. No active extravasation was identified  on non selective and sub selective contrast injection. No additional vascular lesion identified. Normal venous phase. Empiric embolization of the third order branch supplying the bleeding region seen previously was performed using a single 2 mm x 4 cm interlock coil. Follow-up confirmatory arteriogram was obtained. The RIM catheter was then exchanged for a C2, utilized to selectively catheterize the superior mesenteric artery for selective arteriography in multiple projections. No active extravasation or vascular lesion was identified. Venous phase normal. The catheter and sheath were removed and hemostasis achieved with the aid of the Exoseal device after confirmatory femoral arteriography. The patient tolerated the procedure well. IMPRESSION: 1. Technically successful coil embolization of the inferior mesenteric artery third order branch supplying region of active extravasation seen on previous examination. 2. No additional areas of active extravasation or vascular lesion on selective superior and inferior mesenteric arteriography. Electronically Signed   By: Corlis Leak M.D.   On: 03/08/2017 16:31   Ir Angiogram  Selective Each Additional Vessel  Result Date: 03/07/2017 INDICATION: 84 year old female with acute lower GI bleed with the site of bleeding localized to the region of the splenic flexure on tagged RBC scan. She presents for visceral angiography and possible embolization. EXAM: IR EMBO ARTERIAL NOT HEMORR HEMANG INC GUIDE ROADMAPPING; SELECTIVE VISCERAL ARTERIOGRAPHY; ADDITIONAL ARTERIOGRAPHY; IR ULTRASOUND GUIDANCE VASC ACCESS RIGHT 1. Ultrasound-guided vascular access, right common femoral artery 2. Celiac artery catheterization and angiogram (first order) 3. Superior mesenteric artery catheterization and angiogram (first order) 4. Inferior mesenteric artery catheterization and angiogram with additional sub selective arteriography as follows 5. Left colic artery catheterization and angiogram (second order) 6. Ascending branch of the left colic artery catheterization and angiogram (third order) 7. Marginal artery catheterization and angiogram (third order, additional after basic) 8. Coil embolization of marginal artery 9. Catheterization of the descending branch of the left colic artery with angiogram (third order, additional after basic) 10. Catheterization of a distal branch of the ascending branch of the left colic artery with angiogram (third order, additional after basic) MEDICATIONS: None. ANESTHESIA/SEDATION: Moderate (conscious) sedation was employed during this procedure. A total of Versed 1.5 mg and Fentanyl 50 mcg was administered intravenously. Moderate Sedation Time: 44 minutes. The patient's level of consciousness and vital signs were monitored continuously by radiology nursing throughout the procedure under my direct supervision. CONTRAST:  Approximately 75 mL Isovue 300 FLUOROSCOPY TIME:  Fluoroscopy Time: 13 minutes 24 seconds (907 mGy). COMPLICATIONS: None immediate. PROCEDURE: Informed consent was obtained from the patient following explanation of the procedure, risks, benefits and  alternatives. The patient understands, agrees and consents for the procedure. All questions were addressed. A time out was performed prior to the initiation of the procedure. Maximal barrier sterile technique utilized including caps, mask, sterile gowns, sterile gloves, large sterile drape, hand hygiene, and Betadine prep. The right common femoral artery was interrogated with ultrasound and found to be widely patent. An image was obtained and stored for the medical record. Local anesthesia was attained by infiltration with 1% lidocaine. A small dermatotomy was made. Under real-time sonographic guidance, the vessel was punctured with a 21 gauge micropuncture needle. Using standard technique, the initial micro needle was exchanged over a 0.018 micro wire for a transitional 4 [place] micro sheath. The micro sheath was then exchanged over a 0.035 wire for a 5 French vascular sheath. A Bentson wire was advanced in the abdominal aorta. A rim catheter was advanced over the wire. The rim catheter was used to select the inferior mesenteric artery. An arteriogram was  performed. There is early nonvascular extravasation of contrast material arising from what appears to be the marginal artery at the splenic flexure. This corresponds with the site of bleeding seen on the recent tagged red blood cell study. Interestingly, there is a second site of apparent extravasation arising from a distal visceral branch of the ascending and division of the left colic artery in the proximal descending colon. This extravasation is less robust than the region of extravasation in the marginal artery. A renegade ST microcatheter was then advanced over a Fathom 16 wire in used to select the left colic artery. A left colic arteriogram was performed. The anatomy of the ascending and descending branches was successfully identified. There is persistent contrast extravasation from the marginal artery. No definite contrast extravasation noted on this  second angiogram in the region of the descending colon. The catheter was further advanced into the ascending branch of the left colic artery. Arteriography was performed confirming continued abnormal extravasation of contrast from the marginal artery. This has an ovoid appearance and may represent pooling within a dependent diverticulum versus fusiform aneurysmal dilatation. The microcatheter was successfully advanced into the marginal artery. Contrast demonstrates frank extravasation. Coil embolization was performed using a series of 2 and 3 mm fibered detachable interlock coils. Post embolization arteriography confirms complete occlusion of this vessel with no further contrast extravasation. No collateral filling. The microcatheter was brought back into the left colic artery and advanced into the descending division of the left colic artery to further evaluate the other potential site of bleeding. Arteriography was performed. No evidence of bleeding in the proximal descending colon. The microcatheter was readvanced into the ascending branch and out into the distal visceral branch that appeared to be the site of bleeding on the initial angiogram. Arteriography was performed and carried out to the venous phase. Again, there is no evidence of bleeding at this site. No definite vessel abnormality was identified. No embolization was performed at this site. The microcatheter and rim catheter were removed. A C2 cobra catheter was advanced over a Bentson wire into the abdominal aorta and used to select the celiac artery. A celiac arteriogram was performed. Normal arterial anatomy. No evidence of replaced middle colic artery. No active bleeding. The superior mesenteric artery was then catheterized. Arteriography was performed. No evidence of back bleeding from the middle colic artery where it anastomosis with the left colic artery. No additional sites of bleeding were identified. The catheter was removed. A limited right  common femoral arteriogram was performed. Hemostasis was attained with the assistance of an Angio-Seal device. IMPRESSION: 1. Positive angiography with potentially 2 sites of active bleeding in the region of the splenic flexure and proximal descending colon. 2. Successful coil embolization of the more definitive site of bleeding. 3. The second potential site of bleeding could not be identified on subsequent angiography and may have been artifactual. PLAN: 1. Patient is at risk for contrast induced nephropathy given baseline stage 3 chronic kidney disease. Recommend trending BUN and creatinine over the next 48 hours. Keep patient well hydrated. 2. Continue to trend H and H and transfuse as needed. 3. If there is evidence of recurrent or persistent active lower GI bleeding, repeat angiography should be considered. Signed, Sterling Big, MD [facility] Radiology Electronically Signed   By: Malachy Moan M.D.   On: 03/07/2017 09:37   Ir Angiogram Selective Each Additional Vessel  Result Date: 03/07/2017 INDICATION: 84 year old female with acute lower GI bleed with  the site of bleeding localized to the region of the splenic flexure on tagged RBC scan. She presents for visceral angiography and possible embolization. EXAM: IR EMBO ARTERIAL NOT HEMORR HEMANG INC GUIDE ROADMAPPING; SELECTIVE VISCERAL ARTERIOGRAPHY; ADDITIONAL ARTERIOGRAPHY; IR ULTRASOUND GUIDANCE VASC ACCESS RIGHT 1. Ultrasound-guided vascular access, right common femoral artery 2. Celiac artery catheterization and angiogram (first order) 3. Superior mesenteric artery catheterization and angiogram (first order) 4. Inferior mesenteric artery catheterization and angiogram with additional sub selective arteriography as follows 5. Left colic artery catheterization and angiogram (second order) 6. Ascending branch of the left colic artery catheterization and angiogram (third order) 7. Marginal artery  catheterization and angiogram (third order, additional after basic) 8. Coil embolization of marginal artery 9. Catheterization of the descending branch of the left colic artery with angiogram (third order, additional after basic) 10. Catheterization of a distal branch of the ascending branch of the left colic artery with angiogram (third order, additional after basic) MEDICATIONS: None. ANESTHESIA/SEDATION: Moderate (conscious) sedation was employed during this procedure. A total of Versed 1.5 mg and Fentanyl 50 mcg was administered intravenously. Moderate Sedation Time: 44 minutes. The patient's level of consciousness and vital signs were monitored continuously by radiology nursing throughout the procedure under my direct supervision. CONTRAST:  Approximately 75 mL Isovue 300 FLUOROSCOPY TIME:  Fluoroscopy Time: 13 minutes 24 seconds (907 mGy). COMPLICATIONS: None immediate. PROCEDURE: Informed consent was obtained from the patient following explanation of the procedure, risks, benefits and alternatives. The patient understands, agrees and consents for the procedure. All questions were addressed. A time out was performed prior to the initiation of the procedure. Maximal barrier sterile technique utilized including caps, mask, sterile gowns, sterile gloves, large sterile drape, hand hygiene, and Betadine prep. The right common femoral artery was interrogated with ultrasound and found to be widely patent. An image was obtained and stored for the medical record. Local anesthesia was attained by infiltration with 1% lidocaine. A small dermatotomy was made. Under real-time sonographic guidance, the vessel was punctured with a 21 gauge micropuncture needle. Using standard technique, the initial micro needle was exchanged over a 0.018 micro wire for a transitional 4 [place] micro sheath. The micro sheath was then exchanged over a 0.035 wire for a 5 French vascular sheath. A Bentson wire was advanced in the abdominal aorta.  A rim catheter was advanced over the wire. The rim catheter was used to select the inferior mesenteric artery. An arteriogram was performed. There is early nonvascular extravasation of contrast material arising from what appears to be the marginal artery at the splenic flexure. This corresponds with the site of bleeding seen on the recent tagged red blood cell study. Interestingly, there is a second site of apparent extravasation arising from a distal visceral branch of the ascending and division of the left colic artery in the proximal descending colon. This extravasation is less robust than the region of extravasation in the marginal artery. A renegade ST microcatheter was then advanced over a Fathom 16 wire in used to select the left colic artery. A left colic arteriogram was performed. The anatomy of the ascending and descending branches was successfully identified. There is persistent contrast extravasation from the marginal artery. No definite contrast extravasation noted on this second angiogram in the region of the descending colon. The catheter was further advanced into the ascending branch of the left colic artery. Arteriography was performed confirming continued abnormal extravasation of contrast from the marginal artery. This has an ovoid appearance and may represent pooling within  a dependent diverticulum versus fusiform aneurysmal dilatation. The microcatheter was successfully advanced into the marginal artery. Contrast demonstrates frank extravasation. Coil embolization was performed using a series of 2 and 3 mm fibered detachable interlock coils. Post embolization arteriography confirms complete occlusion of this vessel with no further contrast extravasation. No collateral filling. The microcatheter was brought back into the left colic artery and advanced into the descending division of the left colic artery to further evaluate the other potential site of bleeding. Arteriography was performed. No  evidence of bleeding in the proximal descending colon. The microcatheter was readvanced into the ascending branch and out into the distal visceral branch that appeared to be the site of bleeding on the initial angiogram. Arteriography was performed and carried out to the venous phase. Again, there is no evidence of bleeding at this site. No definite vessel abnormality was identified. No embolization was performed at this site. The microcatheter and rim catheter were removed. A C2 cobra catheter was advanced over a Bentson wire into the abdominal aorta and used to select the celiac artery. A celiac arteriogram was performed. Normal arterial anatomy. No evidence of replaced middle colic artery. No active bleeding. The superior mesenteric artery was then catheterized. Arteriography was performed. No evidence of back bleeding from the middle colic artery where it anastomosis with the left colic artery. No additional sites of bleeding were identified. The catheter was removed. A limited right common femoral arteriogram was performed. Hemostasis was attained with the assistance of an Angio-Seal device. IMPRESSION: 1. Positive angiography with potentially 2 sites of active bleeding in the region of the splenic flexure and proximal descending colon. 2. Successful coil embolization of the more definitive site of bleeding. 3. The second potential site of bleeding could not be identified on subsequent angiography and may have been artifactual. PLAN: 1. Patient is at risk for contrast induced nephropathy given baseline stage 3 chronic kidney disease. Recommend trending BUN and creatinine over the next 48 hours. Keep patient well hydrated. 2. Continue to trend H and H and transfuse as needed. 3. If there is evidence of recurrent or persistent active lower GI bleeding, repeat angiography should be considered. Signed, Sterling Big, MD [facility] Radiology Electronically  Signed   By: Malachy Moan M.D.   On: 03/07/2017 09:37   Ir Angiogram Selective Each Additional Vessel  Result Date: 03/07/2017 INDICATION: 84 year old female with acute lower GI bleed with the site of bleeding localized to the region of the splenic flexure on tagged RBC scan. She presents for visceral angiography and possible embolization. EXAM: IR EMBO ARTERIAL NOT HEMORR HEMANG INC GUIDE ROADMAPPING; SELECTIVE VISCERAL ARTERIOGRAPHY; ADDITIONAL ARTERIOGRAPHY; IR ULTRASOUND GUIDANCE VASC ACCESS RIGHT 1. Ultrasound-guided vascular access, right common femoral artery 2. Celiac artery catheterization and angiogram (first order) 3. Superior mesenteric artery catheterization and angiogram (first order) 4. Inferior mesenteric artery catheterization and angiogram with additional sub selective arteriography as follows 5. Left colic artery catheterization and angiogram (second order) 6. Ascending branch of the left colic artery catheterization and angiogram (third order) 7. Marginal artery catheterization and angiogram (third order, additional after basic) 8. Coil embolization of marginal artery 9. Catheterization of the descending branch of the left colic artery with angiogram (third order, additional after basic) 10. Catheterization of a distal branch of the ascending branch of the left colic artery with angiogram (third order, additional after basic) MEDICATIONS: None. ANESTHESIA/SEDATION: Moderate (conscious) sedation was employed during this procedure. A total of Versed 1.5 mg and  Fentanyl 50 mcg was administered intravenously. Moderate Sedation Time: 44 minutes. The patient's level of consciousness and vital signs were monitored continuously by radiology nursing throughout the procedure under my direct supervision. CONTRAST:  Approximately 75 mL Isovue 300 FLUOROSCOPY TIME:  Fluoroscopy Time: 13 minutes 24 seconds (907 mGy). COMPLICATIONS: None immediate. PROCEDURE: Informed consent was obtained from the  patient following explanation of the procedure, risks, benefits and alternatives. The patient understands, agrees and consents for the procedure. All questions were addressed. A time out was performed prior to the initiation of the procedure. Maximal barrier sterile technique utilized including caps, mask, sterile gowns, sterile gloves, large sterile drape, hand hygiene, and Betadine prep. The right common femoral artery was interrogated with ultrasound and found to be widely patent. An image was obtained and stored for the medical record. Local anesthesia was attained by infiltration with 1% lidocaine. A small dermatotomy was made. Under real-time sonographic guidance, the vessel was punctured with a 21 gauge micropuncture needle. Using standard technique, the initial micro needle was exchanged over a 0.018 micro wire for a transitional 4 [place] micro sheath. The micro sheath was then exchanged over a 0.035 wire for a 5 French vascular sheath. A Bentson wire was advanced in the abdominal aorta. A rim catheter was advanced over the wire. The rim catheter was used to select the inferior mesenteric artery. An arteriogram was performed. There is early nonvascular extravasation of contrast material arising from what appears to be the marginal artery at the splenic flexure. This corresponds with the site of bleeding seen on the recent tagged red blood cell study. Interestingly, there is a second site of apparent extravasation arising from a distal visceral branch of the ascending and division of the left colic artery in the proximal descending colon. This extravasation is less robust than the region of extravasation in the marginal artery. A renegade ST microcatheter was then advanced over a Fathom 16 wire in used to select the left colic artery. A left colic arteriogram was performed. The anatomy of the ascending and descending branches was successfully identified. There is persistent contrast extravasation from the  marginal artery. No definite contrast extravasation noted on this second angiogram in the region of the descending colon. The catheter was further advanced into the ascending branch of the left colic artery. Arteriography was performed confirming continued abnormal extravasation of contrast from the marginal artery. This has an ovoid appearance and may represent pooling within a dependent diverticulum versus fusiform aneurysmal dilatation. The microcatheter was successfully advanced into the marginal artery. Contrast demonstrates frank extravasation. Coil embolization was performed using a series of 2 and 3 mm fibered detachable interlock coils. Post embolization arteriography confirms complete occlusion of this vessel with no further contrast extravasation. No collateral filling. The microcatheter was brought back into the left colic artery and advanced into the descending division of the left colic artery to further evaluate the other potential site of bleeding. Arteriography was performed. No evidence of bleeding in the proximal descending colon. The microcatheter was readvanced into the ascending branch and out into the distal visceral branch that appeared to be the site of bleeding on the initial angiogram. Arteriography was performed and carried out to the venous phase. Again, there is no evidence of bleeding at this site. No definite vessel abnormality was identified. No embolization was performed at this site. The microcatheter and rim catheter were removed. A C2 cobra catheter was advanced over a Bentson wire into the abdominal aorta and used to select the  celiac artery. A celiac arteriogram was performed. Normal arterial anatomy. No evidence of replaced middle colic artery. No active bleeding. The superior mesenteric artery was then catheterized. Arteriography was performed. No evidence of back bleeding from the middle colic artery where it anastomosis with the left colic artery. No additional sites of  bleeding were identified. The catheter was removed. A limited right common femoral arteriogram was performed. Hemostasis was attained with the assistance of an Angio-Seal device. IMPRESSION: 1. Positive angiography with potentially 2 sites of active bleeding in the region of the splenic flexure and proximal descending colon. 2. Successful coil embolization of the more definitive site of bleeding. 3. The second potential site of bleeding could not be identified on subsequent angiography and may have been artifactual. PLAN: 1. Patient is at risk for contrast induced nephropathy given baseline stage 3 chronic kidney disease. Recommend trending BUN and creatinine over the next 48 hours. Keep patient well hydrated. 2. Continue to trend H and H and transfuse as needed. 3. If there is evidence of recurrent or persistent active lower GI bleeding, repeat angiography should be considered. Signed, Sterling Big, MD [facility] Radiology Electronically Signed   By: Malachy Moan M.D.   On: 03/07/2017 09:37   Ir US Guide Vasc Access Right  Result Date: 03/08/2017 INDICATION: Lower GI bleed. Vascular lesion embolized 03/06/2017, with suspicion of intermittent bleeding from additional site. Clinically, patient has had continued lower GI bleeding requiring additional transfusion. EXAM: MESENTERIC ARTERIOGRAM 2 VESSEL ARTERIAL EMBOLIZATION  THIRD ORDER ULTRASOUND GUIDANCE FOR ARTERIAL ACCESS MEDICATIONS: No antibiotics indicated ANESTHESIA/SEDATION: Intravenous Fentanyl and Versed were administered as conscious sedation during continuous monitoring of the patient's level of consciousness and physiological / cardiorespiratory status by the radiology RN, with a total moderate sedation time of 47 minutes. CONTRAST:  Isovue-300 FLUOROSCOPY TIME:  6 minutes, 48 second, 363 mGy COMPLICATIONS: None immediate. PROCEDURE: Informed consent was obtained from the patient following  explanation of the procedure, risks, benefits and alternatives. The patient understands, agrees and consents for the procedure. All questions were addressed. A time out was performed prior to the initiation of the procedure. Maximal barrier sterile technique utilized including caps, mask, sterile gowns, sterile gloves, large sterile drape, hand hygiene, and Betadine prep. Right femoral region prepped and draped in usual sterile fashion. Maximal barrier sterile technique was utilized including caps, mask, sterile gowns, sterile gloves, sterile drape, hand hygiene and skin antiseptic. The right common femoral artery was localized under ultrasound. Under real-time ultrasound guidance, the vessel was accessed with a 21-gauge micropuncture needle, exchanged over a 018 guidewire for a transitional dilator, through which a 035 guidewire was advanced. Over this, a 5 [place] vascular sheath was placed, through which a 5 [place] RIM catheter was advanced and used to selectively catheterize the inferior mesenteric artery for selective arteriography in multiple projections. The previously embolized segment showed continued closure. No active extravasation was identified on non selective and sub selective contrast injection. No additional vascular lesion identified. Normal venous phase. Empiric embolization of the third order branch supplying the bleeding region seen previously was performed using a single 2 mm x 4 cm interlock coil. Follow-up confirmatory arteriogram was obtained. The RIM catheter was then exchanged for a C2, utilized to selectively catheterize the superior mesenteric artery for selective arteriography in multiple projections. No active extravasation or vascular lesion was identified. Venous phase normal. The catheter and sheath were removed and hemostasis achieved with the aid of the Exoseal device after confirmatory  femoral arteriography. The patient tolerated the procedure well. IMPRESSION: 1. Technically  successful coil embolization of the inferior mesenteric artery third order branch supplying region of active extravasation seen on previous examination. 2. No additional areas of active extravasation or vascular lesion on selective superior and inferior mesenteric arteriography. Electronically Signed   By: Corlis Leak M.D.   On: 03/08/2017 16:31   Ir US Guide Vasc Access Right  Result Date: 03/07/2017 INDICATION: 84 year old female with acute lower GI bleed with the site of bleeding localized to the region of the splenic flexure on tagged RBC scan. She presents for visceral angiography and possible embolization. EXAM: IR EMBO ARTERIAL NOT HEMORR HEMANG INC GUIDE ROADMAPPING; SELECTIVE VISCERAL ARTERIOGRAPHY; ADDITIONAL ARTERIOGRAPHY; IR ULTRASOUND GUIDANCE VASC ACCESS RIGHT 1. Ultrasound-guided vascular access, right common femoral artery 2. Celiac artery catheterization and angiogram (first order) 3. Superior mesenteric artery catheterization and angiogram (first order) 4. Inferior mesenteric artery catheterization and angiogram with additional sub selective arteriography as follows 5. Left colic artery catheterization and angiogram (second order) 6. Ascending branch of the left colic artery catheterization and angiogram (third order) 7. Marginal artery catheterization and angiogram (third order, additional after basic) 8. Coil embolization of marginal artery 9. Catheterization of the descending branch of the left colic artery with angiogram (third order, additional after basic) 10. Catheterization of a distal branch of the ascending branch of the left colic artery with angiogram (third order, additional after basic) MEDICATIONS: None. ANESTHESIA/SEDATION: Moderate (conscious) sedation was employed during this procedure. A total of Versed 1.5 mg and Fentanyl 50 mcg was administered intravenously. Moderate Sedation Time: 44 minutes. The patient's level of consciousness and vital signs were monitored continuously  by radiology nursing throughout the procedure under my direct supervision. CONTRAST:  Approximately 75 mL Isovue 300 FLUOROSCOPY TIME:  Fluoroscopy Time: 13 minutes 24 seconds (907 mGy). COMPLICATIONS: None immediate. PROCEDURE: Informed consent was obtained from the patient following explanation of the procedure, risks, benefits and alternatives. The patient understands, agrees and consents for the procedure. All questions were addressed. A time out was performed prior to the initiation of the procedure. Maximal barrier sterile technique utilized including caps, mask, sterile gowns, sterile gloves, large sterile drape, hand hygiene, and Betadine prep. The right common femoral artery was interrogated with ultrasound and found to be widely patent. An image was obtained and stored for the medical record. Local anesthesia was attained by infiltration with 1% lidocaine. A small dermatotomy was made. Under real-time sonographic guidance, the vessel was punctured with a 21 gauge micropuncture needle. Using standard technique, the initial micro needle was exchanged over a 0.018 micro wire for a transitional 4 [place] micro sheath. The micro sheath was then exchanged over a 0.035 wire for a 5 French vascular sheath. A Bentson wire was advanced in the abdominal aorta. A rim catheter was advanced over the wire. The rim catheter was used to select the inferior mesenteric artery. An arteriogram was performed. There is early nonvascular extravasation of contrast material arising from what appears to be the marginal artery at the splenic flexure. This corresponds with the site of bleeding seen on the recent tagged red blood cell study. Interestingly, there is a second site of apparent extravasation arising from a distal visceral branch of the ascending and division of the left colic artery in the proximal descending colon. This extravasation is less robust than the region of extravasation in the marginal artery. A renegade ST  microcatheter was then advanced over a Fathom 16 wire in used to  select the left colic artery. A left colic arteriogram was performed. The anatomy of the ascending and descending branches was successfully identified. There is persistent contrast extravasation from the marginal artery. No definite contrast extravasation noted on this second angiogram in the region of the descending colon. The catheter was further advanced into the ascending branch of the left colic artery. Arteriography was performed confirming continued abnormal extravasation of contrast from the marginal artery. This has an ovoid appearance and may represent pooling within a dependent diverticulum versus fusiform aneurysmal dilatation. The microcatheter was successfully advanced into the marginal artery. Contrast demonstrates frank extravasation. Coil embolization was performed using a series of 2 and 3 mm fibered detachable interlock coils. Post embolization arteriography confirms complete occlusion of this vessel with no further contrast extravasation. No collateral filling. The microcatheter was brought back into the left colic artery and advanced into the descending division of the left colic artery to further evaluate the other potential site of bleeding. Arteriography was performed. No evidence of bleeding in the proximal descending colon. The microcatheter was readvanced into the ascending branch and out into the distal visceral branch that appeared to be the site of bleeding on the initial angiogram. Arteriography was performed and carried out to the venous phase. Again, there is no evidence of bleeding at this site. No definite vessel abnormality was identified. No embolization was performed at this site. The microcatheter and rim catheter were removed. A C2 cobra catheter was advanced over a Bentson wire into the abdominal aorta and used to select the celiac artery. A celiac arteriogram was performed. Normal arterial anatomy. No evidence  of replaced middle colic artery. No active bleeding. The superior mesenteric artery was then catheterized. Arteriography was performed. No evidence of back bleeding from the middle colic artery where it anastomosis with the left colic artery. No additional sites of bleeding were identified. The catheter was removed. A limited right common femoral arteriogram was performed. Hemostasis was attained with the assistance of an Angio-Seal device. IMPRESSION: 1. Positive angiography with potentially 2 sites of active bleeding in the region of the splenic flexure and proximal descending colon. 2. Successful coil embolization of the more definitive site of bleeding. 3. The second potential site of bleeding could not be identified on subsequent angiography and may have been artifactual. PLAN: 1. Patient is at risk for contrast induced nephropathy given baseline stage 3 chronic kidney disease. Recommend trending BUN and creatinine over the next 48 hours. Keep patient well hydrated. 2. Continue to trend H and H and transfuse as needed. 3. If there is evidence of recurrent or persistent active lower GI bleeding, repeat angiography should be considered. Signed, Sterling Big, MD [facility] Radiology Electronically Signed   By: Malachy Moan M.D.   On: 03/07/2017 09:37   Ir Embo Art  [facility] Hemorr [facility] [employer] Scripts Guide Roadmapping  Result Date: 03/08/2017 INDICATION: Lower GI bleed. Vascular lesion embolized 03/06/2017, with suspicion of intermittent bleeding from additional site. Clinically, patient has had continued lower GI bleeding requiring additional transfusion. EXAM: MESENTERIC ARTERIOGRAM 2 VESSEL ARTERIAL EMBOLIZATION  THIRD ORDER ULTRASOUND GUIDANCE FOR ARTERIAL ACCESS MEDICATIONS: No antibiotics indicated ANESTHESIA/SEDATION: Intravenous Fentanyl and Versed were administered as conscious sedation during continuous monitoring of the patient's level of  consciousness and physiological / cardiorespiratory status by the radiology RN, with a total moderate sedation time of 47 minutes. CONTRAST:  Isovue-300 FLUOROSCOPY TIME:  6 minutes, 48 second, 363 mGy COMPLICATIONS: None immediate. PROCEDURE: Informed  consent was obtained from the patient following explanation of the procedure, risks, benefits and alternatives. The patient understands, agrees and consents for the procedure. All questions were addressed. A time out was performed prior to the initiation of the procedure. Maximal barrier sterile technique utilized including caps, mask, sterile gowns, sterile gloves, large sterile drape, hand hygiene, and Betadine prep. Right femoral region prepped and draped in usual sterile fashion. Maximal barrier sterile technique was utilized including caps, mask, sterile gowns, sterile gloves, sterile drape, hand hygiene and skin antiseptic. The right common femoral artery was localized under ultrasound. Under real-time ultrasound guidance, the vessel was accessed with a 21-gauge micropuncture needle, exchanged over a 018 guidewire for a transitional dilator, through which a 035 guidewire was advanced. Over this, a 5 [place] vascular sheath was placed, through which a 5 [place] RIM catheter was advanced and used to selectively catheterize the inferior mesenteric artery for selective arteriography in multiple projections. The previously embolized segment showed continued closure. No active extravasation was identified on non selective and sub selective contrast injection. No additional vascular lesion identified. Normal venous phase. Empiric embolization of the third order branch supplying the bleeding region seen previously was performed using a single 2 mm x 4 cm interlock coil. Follow-up confirmatory arteriogram was obtained. The RIM catheter was then exchanged for a C2, utilized to selectively catheterize the superior mesenteric artery for selective arteriography in multiple  projections. No active extravasation or vascular lesion was identified. Venous phase normal. The catheter and sheath were removed and hemostasis achieved with the aid of the Exoseal device after confirmatory femoral arteriography. The patient tolerated the procedure well. IMPRESSION: 1. Technically successful coil embolization of the inferior mesenteric artery third order branch supplying region of active extravasation seen on previous examination. 2. No additional areas of active extravasation or vascular lesion on selective superior and inferior mesenteric arteriography. Electronically Signed   By: Corlis Leak M.D.   On: 03/08/2017 16:31   Ir Embo [place] Hemorr Lymph [employer] Scripts Guide Roadmapping  Result Date: 03/07/2017 INDICATION: 84 year old female with acute lower GI bleed with the site of bleeding localized to the region of the splenic flexure on tagged RBC scan. She presents for visceral angiography and possible embolization. EXAM: IR EMBO ARTERIAL NOT HEMORR HEMANG INC GUIDE ROADMAPPING; SELECTIVE VISCERAL ARTERIOGRAPHY; ADDITIONAL ARTERIOGRAPHY; IR ULTRASOUND GUIDANCE VASC ACCESS RIGHT 1. Ultrasound-guided vascular access, right common femoral artery 2. Celiac artery catheterization and angiogram (first order) 3. Superior mesenteric artery catheterization and angiogram (first order) 4. Inferior mesenteric artery catheterization and angiogram with additional sub selective arteriography as follows 5. Left colic artery catheterization and angiogram (second order) 6. Ascending branch of the left colic artery catheterization and angiogram (third order) 7. Marginal artery catheterization and angiogram (third order, additional after basic) 8. Coil embolization of marginal artery 9. Catheterization of the descending branch of the left colic artery with angiogram (third order, additional after basic) 10. Catheterization of a distal branch of the ascending branch of the left colic artery with angiogram (third  order, additional after basic) MEDICATIONS: None. ANESTHESIA/SEDATION: Moderate (conscious) sedation was employed during this procedure. A total of Versed 1.5 mg and Fentanyl 50 mcg was administered intravenously. Moderate Sedation Time: 44 minutes. The patient's level of consciousness and vital signs were monitored continuously by radiology nursing throughout the procedure under my direct supervision. CONTRAST:  Approximately 75 mL Isovue 300 FLUOROSCOPY TIME:  Fluoroscopy Time: 13 minutes 24 seconds (907 mGy). COMPLICATIONS: None immediate. PROCEDURE: Informed consent was  obtained from the patient following explanation of the procedure, risks, benefits and alternatives. The patient understands, agrees and consents for the procedure. All questions were addressed. A time out was performed prior to the initiation of the procedure. Maximal barrier sterile technique utilized including caps, mask, sterile gowns, sterile gloves, large sterile drape, hand hygiene, and Betadine prep. The right common femoral artery was interrogated with ultrasound and found to be widely patent. An image was obtained and stored for the medical record. Local anesthesia was attained by infiltration with 1% lidocaine. A small dermatotomy was made. Under real-time sonographic guidance, the vessel was punctured with a 21 gauge micropuncture needle. Using standard technique, the initial micro needle was exchanged over a 0.018 micro wire for a transitional 4 [place] micro sheath. The micro sheath was then exchanged over a 0.035 wire for a 5 French vascular sheath. A Bentson wire was advanced in the abdominal aorta. A rim catheter was advanced over the wire. The rim catheter was used to select the inferior mesenteric artery. An arteriogram was performed. There is early nonvascular extravasation of contrast material arising from what appears to be the marginal artery at the splenic flexure. This corresponds with the site of bleeding seen on the  recent tagged red blood cell study. Interestingly, there is a second site of apparent extravasation arising from a distal visceral branch of the ascending and division of the left colic artery in the proximal descending colon. This extravasation is less robust than the region of extravasation in the marginal artery. A renegade ST microcatheter was then advanced over a Fathom 16 wire in used to select the left colic artery. A left colic arteriogram was performed. The anatomy of the ascending and descending branches was successfully identified. There is persistent contrast extravasation from the marginal artery. No definite contrast extravasation noted on this second angiogram in the region of the descending colon. The catheter was further advanced into the ascending branch of the left colic artery. Arteriography was performed confirming continued abnormal extravasation of contrast from the marginal artery. This has an ovoid appearance and may represent pooling within a dependent diverticulum versus fusiform aneurysmal dilatation. The microcatheter was successfully advanced into the marginal artery. Contrast demonstrates frank extravasation. Coil embolization was performed using a series of 2 and 3 mm fibered detachable interlock coils. Post embolization arteriography confirms complete occlusion of this vessel with no further contrast extravasation. No collateral filling. The microcatheter was brought back into the left colic artery and advanced into the descending division of the left colic artery to further evaluate the other potential site of bleeding. Arteriography was performed. No evidence of bleeding in the proximal descending colon. The microcatheter was readvanced into the ascending branch and out into the distal visceral branch that appeared to be the site of bleeding on the initial angiogram. Arteriography was performed and carried out to the venous phase. Again, there is no evidence of bleeding at this  site. No definite vessel abnormality was identified. No embolization was performed at this site. The microcatheter and rim catheter were removed. A C2 cobra catheter was advanced over a Bentson wire into the abdominal aorta and used to select the celiac artery. A celiac arteriogram was performed. Normal arterial anatomy. No evidence of replaced middle colic artery. No active bleeding. The superior mesenteric artery was then catheterized. Arteriography was performed. No evidence of back bleeding from the middle colic artery where it anastomosis with the left colic artery. No additional sites of bleeding were identified. The catheter  was removed. A limited right common femoral arteriogram was performed. Hemostasis was attained with the assistance of an Angio-Seal device. IMPRESSION: 1. Positive angiography with potentially 2 sites of active bleeding in the region of the splenic flexure and proximal descending colon. 2. Successful coil embolization of the more definitive site of bleeding. 3. The second potential site of bleeding could not be identified on subsequent angiography and may have been artifactual. PLAN: 1. Patient is at risk for contrast induced nephropathy given baseline stage 3 chronic kidney disease. Recommend trending BUN and creatinine over the next 48 hours. Keep patient well hydrated. 2. Continue to trend H and H and transfuse as needed. 3. If there is evidence of recurrent or persistent active lower GI bleeding, repeat angiography should be considered. Signed, Sterling Big, MD [facility] Radiology Electronically Signed   By: Malachy Moan M.D.   On: 03/07/2017 09:37   - Visceral angiogram with coil embolization of bleeding branch of the left colic artery 03/06/2017.   - Mesenteric angio (2-vessel) with embolization IMA branch site of extravasation seen previously 03/08/2017.  Subjective: Feels well, has had no bleeding >24 hrs and  tolerating an advanced diet.   Discharge Exam: Vitals:   03/12/17 0515 03/12/17 1418  BP: (!) 132/52 (!) 151/62  Pulse: 66 86  Resp: 18 18  Temp: 98.6 F (37 C) 98.3 F (36.8 C)  SpO2: 100% 100%   General: Pt is alert, awake, not in acute distress Cardiovascular: RRR, S1/S2 +, no rubs, no gallops Respiratory: CTA bilaterally, no wheezing, no rhonchi Abdominal: Soft, NT, ND, bowel sounds + Extremities: No edema, no cyanosis  Labs: Basic Metabolic Panel: Recent Labs  Lab 03/08/17 0236 03/09/17 0613 03/10/17 0452 03/11/17 0921 03/12/17 0602  NA 141 138 142 140 140  K 3.8 3.8 4.6 3.8 3.7  CL 111 111 114* 113* 112*  CO2 23 19* 22 20* 22  GLUCOSE 98 92 82 86 91  BUN 17 15 11 8 6   CREATININE 1.28* 1.13* 1.06* 1.02* 1.07*  CALCIUM 7.8* 7.6* 8.1* 8.2* 8.1*   Liver Function Tests: Recent Labs  Lab 03/06/17 0839 03/07/17 0745 03/09/17 0613  AST 19 17 15   ALT 9* 7* 9*  ALKPHOS 43 36* 34*  BILITOT 0.4 0.6 0.6  PROT 5.6* 4.6* 4.2*  ALBUMIN 3.0* 2.5* 2.3*   CBC: Recent Labs  Lab 03/06/17 0839  03/07/17 0745  03/09/17 1209 03/09/17 2251 03/10/17 0452 03/11/17 0921 03/12/17 0602  WBC 4.5   < > 8.2  --   --  6.3 5.7 6.0 4.9  NEUTROABS 3.0  --   --   --   --   --   --   --   --   HGB 8.7*   < > 8.1*   < > 9.0* 9.5* 9.3* 10.2* 9.2*  HCT 26.5*   < > 24.4*   < > 26.9* 28.7* 28.2* 30.6* 27.6*  MCV 92.7   < > 89.4  --   --  87.5 88.1 89.0 89.3  PLT 243   < > 143*  --   --  149* 163 185 210   < > = values in this interval not displayed.   CBG: Recent Labs  Lab 03/09/17 2153  GLUCAP 75   Urinalysis    Component Value Date/Time   COLORURINE YELLOW 09/08/2014 1319   APPEARANCEUR CLEAR 09/08/2014 1319   LABSPEC 1.018 09/08/2014 1319   PHURINE 7.0 09/08/2014 1319  GLUCOSEU NEGATIVE 09/08/2014 1319   HGBUR TRACE (A) 09/08/2014 1319   BILIRUBINUR NEGATIVE 09/08/2014 1319   KETONESUR NEGATIVE 09/08/2014 1319   PROTEINUR NEGATIVE 09/08/2014 1319   UROBILINOGEN 1.0  09/08/2014 1319   NITRITE NEGATIVE 09/08/2014 1319   LEUKOCYTESUR NEGATIVE 09/08/2014 1319    Microbiology Recent Results (from the past 240 hour(s))  MRSA PCR Screening     Status: None   Collection Time: 03/06/17  2:14 PM  Result Value Ref Range Status   MRSA by PCR NEGATIVE NEGATIVE Final    Comment:        The GeneXpert MRSA Assay (FDA approved for NASAL specimens only), is one component of a comprehensive MRSA colonization surveillance program. It is not intended to diagnose MRSA infection nor to guide or monitor treatment for MRSA infections.     Time coordinating discharge: Approximately 40 minutes  Brenda Junkeryan Cing [place], MD  [facility] 03/12/2017, 4:32 PM Pager [phone]

## 2017-03-12 NOTE — Care Management Important Message (Signed)
Important Message  Patient Details  Name: [name] MRN: [id number] Date of Birth: 11/20/1923   Medicare Important Message Given:  Yes    [place] Brigance Stefan [place] 03/12/2017, 1:09 PM

## 2017-04-11 ENCOUNTER — Encounter (HOSPITAL_COMMUNITY): Payer: Self-pay | Admitting: Interventional Radiology

## 2017-08-06 ENCOUNTER — Emergency Department (HOSPITAL_COMMUNITY)
Admission: EM | Admit: 2017-08-06 | Discharge: 2017-08-07 | Disposition: A | Discharge status: Home / Self Care | Payer: Medicare Other | Attending: Emergency Medicine | Admitting: Emergency Medicine

## 2017-08-06 DIAGNOSIS — I129 Hypertensive chronic kidney disease with stage 1 through stage 4 chronic kidney disease, or unspecified chronic kidney disease: Secondary | ICD-10-CM | POA: Insufficient documentation

## 2017-08-06 DIAGNOSIS — R1084 Generalized abdominal pain: Secondary | ICD-10-CM | POA: Diagnosis not present

## 2017-08-06 DIAGNOSIS — Z79899 Other long term (current) drug therapy: Secondary | ICD-10-CM | POA: Diagnosis not present

## 2017-08-06 DIAGNOSIS — R112 Nausea with vomiting, unspecified: Secondary | ICD-10-CM | POA: Diagnosis present

## 2017-08-06 DIAGNOSIS — Z87891 Personal history of nicotine dependence: Secondary | ICD-10-CM | POA: Diagnosis not present

## 2017-08-06 DIAGNOSIS — Z96642 Presence of left artificial hip joint: Secondary | ICD-10-CM | POA: Insufficient documentation

## 2017-08-06 DIAGNOSIS — N183 Chronic kidney disease, stage 3 (moderate): Secondary | ICD-10-CM | POA: Insufficient documentation

## 2017-08-06 NOTE — ED Notes (Signed)
Bed: [id number] Expected date:  Expected time:  Means of arrival:  Comments: 84 yr old abdominal pain

## 2017-08-07 ENCOUNTER — Emergency Department (HOSPITAL_COMMUNITY): Payer: Medicare Other

## 2017-08-07 ENCOUNTER — Other Ambulatory Visit: Payer: Self-pay

## 2017-08-07 ENCOUNTER — Encounter (HOSPITAL_COMMUNITY): Payer: Self-pay | Admitting: Family Medicine

## 2017-08-07 DIAGNOSIS — R112 Nausea with vomiting, unspecified: Secondary | ICD-10-CM | POA: Diagnosis not present

## 2017-08-07 LAB — I-STAT CHEM 8, ED
BUN: 29 mg/dL — ABNORMAL HIGH (ref 8–23)
Calcium, Ion: 1.23 mmol/L (ref 1.15–1.40)
Chloride: 105 mmol/L (ref 98–111)
Creatinine, Ser: 1.5 mg/dL — ABNORMAL HIGH (ref 0.44–1.00)
Glucose, Bld: 119 mg/dL — ABNORMAL HIGH (ref 70–99)
HCT: 33 % — ABNORMAL LOW (ref 36.0–46.0)
Hemoglobin: 11.2 g/dL — ABNORMAL LOW (ref 12.0–15.0)
Potassium: 4.2 mmol/L (ref 3.5–5.1)
Sodium: 142 mmol/L (ref 135–145)
TCO2: 25 mmol/L (ref 22–32)

## 2017-08-07 LAB — HEPATIC FUNCTION PANEL
ALT: 11 U/L (ref 0–44)
AST: 18 U/L (ref 15–41)
Albumin: 3.8 g/dL (ref 3.5–5.0)
Alkaline Phosphatase: 55 U/L (ref 38–126)
Bilirubin, Direct: 0.1 mg/dL (ref 0.0–0.2)
Indirect Bilirubin: 0.3 mg/dL (ref 0.3–0.9)
Total Bilirubin: 0.4 mg/dL (ref 0.3–1.2)
Total Protein: 6.8 g/dL (ref 6.5–8.1)

## 2017-08-07 LAB — LIPASE, BLOOD: Lipase: 88 U/L — ABNORMAL HIGH (ref 11–51)

## 2017-08-07 LAB — CBC WITH DIFFERENTIAL/PLATELET
Basophils Absolute: 0 10*3/uL (ref 0.0–0.1)
Basophils Relative: 0 %
Eosinophils Absolute: 0.1 10*3/uL (ref 0.0–0.7)
Eosinophils Relative: 2 %
HCT: 33.5 % — ABNORMAL LOW (ref 36.0–46.0)
Hemoglobin: 10.9 g/dL — ABNORMAL LOW (ref 12.0–15.0)
Lymphocytes Relative: 10 %
Lymphs Abs: 0.8 10*3/uL (ref 0.7–4.0)
MCH: 30.1 pg (ref 26.0–34.0)
MCHC: 32.5 g/dL (ref 30.0–36.0)
MCV: 92.5 fL (ref 78.0–100.0)
Monocytes Absolute: 0.4 10*3/uL (ref 0.1–1.0)
Monocytes Relative: 6 %
Neutro Abs: 6.5 10*3/uL (ref 1.7–7.7)
Neutrophils Relative %: 82 %
Platelets: 264 10*3/uL (ref 150–400)
RBC: 3.62 MIL/uL — ABNORMAL LOW (ref 3.87–5.11)
RDW: 15.2 % (ref 11.5–15.5)
WBC: 7.8 10*3/uL (ref 4.0–10.5)

## 2017-08-07 LAB — POC OCCULT BLOOD, ED: Fecal Occult Bld: NEGATIVE

## 2017-08-07 MED ORDER — ACETAMINOPHEN 500 MG PO TABS
1000.0000 mg | ORAL_TABLET | Freq: Once | ORAL | Status: AC
  Administered 2017-08-07: 1000 mg via ORAL
  Filled 2017-08-07: qty 2

## 2017-08-07 MED ORDER — ONDANSETRON HCL 4 MG/2ML IJ SOLN
4.0000 mg | Freq: Once | INTRAMUSCULAR | Status: AC
Start: 2017-08-07 — End: 2017-08-07
  Administered 2017-08-07: 4 mg via INTRAVENOUS
  Filled 2017-08-07: qty 2

## 2017-08-07 MED ORDER — GI COCKTAIL ~~LOC~~
30.0000 mL | Freq: Once | ORAL | Status: AC
  Administered 2017-08-07: 30 mL via ORAL
  Filled 2017-08-07: qty 30

## 2017-08-07 MED ORDER — DICYCLOMINE HCL 10 MG/ML IM SOLN
20.0000 mg | Freq: Once | INTRAMUSCULAR | Status: AC
  Administered 2017-08-07: 20 mg via INTRAMUSCULAR
  Filled 2017-08-07: qty 2

## 2017-08-07 MED ORDER — ONDANSETRON 8 MG PO TBDP: ORAL_TABLET | ORAL | 0 refills | Status: DC

## 2017-08-07 MED ORDER — SODIUM CHLORIDE 0.9 % IV BOLUS
500.0000 mL | Freq: Once | INTRAVENOUS | Status: AC
Start: 2017-08-07 — End: 2017-08-07
  Administered 2017-08-07: 500 mL via INTRAVENOUS

## 2017-08-07 NOTE — ED Triage Notes (Signed)
[facility] is from [facility] Patient is complaining of abd pain for the last week with vomiting. EMS reports she did vomit while transporting.

## 2017-08-07 NOTE — ED Notes (Signed)
Attempted to call pts daughter for transportation, no answer

## 2017-08-07 NOTE — ED Notes (Signed)
Pt states still unable to give urine sample.

## 2017-08-07 NOTE — ED Notes (Signed)
Pt aware need for urine sample, unable to at this time

## 2017-08-07 NOTE — ED Notes (Signed)
Pt understand discharge, called daughter for a ride home

## 2017-08-07 NOTE — ED Provider Notes (Addendum)
[facility]-EMERGENCY DEPT Provider Note   CSN: [id number] Arrival date & time: 08/06/17  2355     History   Chief Complaint Chief Complaint  Patient presents with  . Abdominal Pain    HPI [name] is a 84 y.o. female.  The history is provided by the patient.  Abdominal Pain   This is a recurrent problem. The current episode started more than 1 week ago (about a month ago). The problem occurs constantly. The problem has not changed since onset.The pain is associated with an unknown factor. The pain is located in the generalized abdominal region. The pain is moderate. Associated symptoms include vomiting. Pertinent negatives include anorexia, fever, belching, diarrhea, hematochezia, melena, constipation, dysuria and frequency. Nothing aggravates the symptoms. Nothing relieves the symptoms. Past workup includes CT scan. Her past medical history does not include Crohn's disease.  Same as previous admission has not told anyone about the symptoms nor has she taken anything.  She states the only thing that is different is there hasn't been any bleeding she hasn't taken even one tylenol or any other medication for her ongoing pain and has not seen a doctor.    Past Medical History:  Diagnosis Date  . Acid reflux    takes Nexium daily  . Anemia    takes Ferrous Sulfate daily  . Aortic stenosis    mild AS pk grad 18, mean grad 9, AVA 1.32 cm 03/2015 echo (Dr. Jacinto Halim)  . Arthritis   . Chronic kidney disease (CKD), stage III (moderate) (HCC)   . Diverticulitis   . Hematochezia 03/2015  . High cholesterol    can't take the meds  . History of blood transfusion 03/2015   no abnormal reaction noted  . History of GI diverticular bleed   . HTN (hypertension)    takes Losartan-HCTZ and Metoprolol daily  . Joint pain   . Joint swelling   . Nocturia   . Numbness    in legs  . Peripheral edema    occasionally  . Shortness of breath dyspnea    occasionally and with  exertion  . Slow urinary stream    at times    Patient Active Problem List   Diagnosis Date Noted  . Acute lower GI bleeding 03/06/2017  . Diverticula, colon 03/06/2017  . HTN (hypertension) 03/06/2017  . High cholesterol 03/06/2017  . Acute kidney injury (HCC) 03/06/2017  . Acute blood loss anemia 03/06/2017  . Hiatal hernia 11/28/2016  . Chest pain, rule out acute myocardial infarction 11/27/2016  . Hyperlipidemia 05/24/2015  . Aortic stenosis 05/24/2015  . GERD without esophagitis 05/23/2015  . Postoperative anemia due to acute blood loss 05/23/2015  . Osteoarthritis of left hip 05/17/2015  . 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 GI bleed 02/21/2015  . Acute bronchitis 11/16/2012  . Hypertension 11/13/2012  . Bright red blood per rectum 11/13/2012  . Lower GI bleed 02/23/2012  . History of GI diverticular bleed 02/23/2012  . Diverticulosis 02/23/2012    Past Surgical History:  Procedure Laterality Date  . ABCESS DRAINAGE     abdominal abcess  . cataract surgery Bilateral   . CHOLECYSTECTOMY    . DILATION AND CURETTAGE OF UTERUS    . ESOPHAGOGASTRODUODENOSCOPY N/A 02/23/2015   Procedure: ESOPHAGOGASTRODUODENOSCOPY (EGD);  Surgeon: Carman Ching, MD;  Location[facility] ENDOSCOPY;  Service: Endoscopy;  Laterality: N/A;  . FLEXIBLE SIGMOIDOSCOPY N/A 02/24/2015   Procedure: FLEXIBLE SIGMOIDOSCOPY;  Surgeon: Carman Ching, MD;  Location: [facility] ENDOSCOPY;  Service: Endoscopy;  Laterality: N/A;  . IR ANGIOGRAM SELECTIVE EACH ADDITIONAL VESSEL  03/06/2017  . IR ANGIOGRAM SELECTIVE EACH ADDITIONAL VESSEL  03/06/2017  . IR ANGIOGRAM SELECTIVE EACH ADDITIONAL VESSEL  03/06/2017  . IR ANGIOGRAM SELECTIVE EACH ADDITIONAL VESSEL  03/08/2017  . IR ANGIOGRAM VISCERAL SELECTIVE  03/06/2017  . IR ANGIOGRAM VISCERAL SELECTIVE  03/06/2017  . IR ANGIOGRAM VISCERAL SELECTIVE  03/06/2017  . IR ANGIOGRAM VISCERAL SELECTIVE  03/08/2017  . IR ANGIOGRAM VISCERAL SELECTIVE  03/08/2017  . IR  EMBO ART  VEN HEMORR LYMPH EXTRAV  INC GUIDE ROADMAPPING  03/06/2017  . IR EMBO ART  VEN HEMORR LYMPH EXTRAV  INC GUIDE ROADMAPPING  03/08/2017  . IR US GUIDE VASC ACCESS RIGHT  03/06/2017  . IR US GUIDE VASC ACCESS RIGHT  03/08/2017  . left knee surgery    . TOTAL HIP ARTHROPLASTY Left 05/17/2015   Procedure: LEFT TOTAL HIP ARTHROPLASTY ANTERIOR APPROACH;  Surgeon: Kathryne Hitchhristopher Y Blackman, MD;  Location: [facility] OR;  Service: Orthopedics;  Laterality: Left;     OB History   None      Home Medications    Prior to Admission medications   Medication Sig Start Date End Date Taking? Authorizing Provider  acetaminophen (TYLENOL) 325 MG tablet Take 325 mg by mouth every 6 (six) hours as needed. pain   Yes [provider]  calcium carbonate (TUMS - DOSED IN MG ELEMENTAL CALCIUM) 500 MG chewable tablet Chew 1 tablet by mouth daily as needed for indigestion or heartburn.   Yes [provider]  Cholecalciferol (VITAMIN D3) 2000 units TABS Take 2,000 Units by mouth daily.   Yes [provider]  diclofenac sodium (VOLTAREN) 1 % GEL Apply 2 g topically 2 (two) times daily.   Yes [provider]  ferrous sulfate 325 (65 FE) MG tablet Take 325 mg by mouth daily with breakfast.   Yes [provider]  furosemide (LASIX) 40 MG tablet Take 40 mg by mouth daily. 01/14/17  Yes [provider]  losartan-hydrochlorothiazide (HYZAAR) 100-12.5 MG tablet Take 1 tablet by mouth daily.   Yes [provider]  metoprolol tartrate (LOPRESSOR) 25 MG tablet Take 0.5 tablets (12.5 mg total) by mouth 2 (two) times daily. Patient taking differently: Take 25 mg by mouth 2 (two) times daily.  03/12/17  Yes Tyrone NineGrunz, Ryan B, MD  Multiple Vitamin (MULITIVITAMIN WITH MINERALS) TABS Take 1 tablet by mouth daily.   Yes [provider]  nitroGLYCERIN (NITROSTAT) 0.4 MG SL tablet Place 0.4 mg under the tongue every 5 (five) minutes as needed for chest pain.    Yes [provider]  polyethylene glycol powder (GLYCOLAX/MIRALAX) powder Take 17 g by mouth daily as needed for mild constipation.   Yes [provider]  PROAIR HFA 108 (90 Base) MCG/ACT inhaler Inhale 1-2 puffs into the lungs every 4 (four) hours as needed for shortness of breath or wheezing. 12/04/16  Yes [provider]    Family History Family History  Problem Relation Age of Onset  . Diabetes Other   . Hypertension Other   . Cancer Other   . Heart disease Other     Social History Social History   Tobacco Use  . Smoking status: Former Games developermoker  . Smokeless tobacco: Never Used  . Tobacco comment: quit smoking 8+ yrs ago  Substance Use Topics  . Alcohol use: No    Comment: quit yrs ago  . Drug use: No  Allergies   Ezetimibe; Lipitor [atorvastatin]; Pravachol [pravastatin]; Statins; Zocor [simvastatin]; and Cholestyramine   Review of Systems Review of Systems  Constitutional: Negative for diaphoresis and fever.  Respiratory: Negative for shortness of breath.   Cardiovascular: Negative for chest pain.  Gastrointestinal: Positive for abdominal pain and vomiting. Negative for abdominal distention, anal bleeding, anorexia, blood in stool, constipation, diarrhea, hematochezia and melena.  Genitourinary: Negative for dysuria, frequency and vaginal pain.  Neurological: Negative for numbness.  All other systems reviewed and are negative.    Physical Exam Updated Vital Signs BP (!) 97/54   Pulse (!) 56   Temp 99.1 F (37.3 C) (Oral)   Resp 17   SpO2 97%   Physical Exam  Constitutional: She is oriented to person, place, and time. She appears well-developed and well-nourished. No distress.  HENT:  Head: Normocephalic and atraumatic.  Mouth/Throat: No oropharyngeal exudate.  Eyes: Pupils are equal, round, and reactive to light. Conjunctivae are normal.  Neck: Normal range of motion. Neck supple.  Cardiovascular: Normal rate, regular rhythm, normal heart  sounds and intact distal pulses.  Pulmonary/Chest: Effort normal and breath sounds normal. No stridor. She has no wheezes. She has no rales.  Abdominal: Soft. Bowel sounds are normal. She exhibits no distension and no mass. There is no tenderness. There is no rebound and no guarding.  Genitourinary: Rectal exam shows guaiac negative stool.  Genitourinary Comments: Chaperone present  Musculoskeletal: Normal range of motion.  Neurological: She is alert and oriented to person, place, and time. She displays normal reflexes.  Skin: Skin is warm and dry. Capillary refill takes less than 2 seconds.  Psychiatric: She has a normal mood and affect.     ED Treatments / Results  Labs (all labs ordered are listed, but only abnormal results are displayed) Results for orders placed or performed during the hospital encounter of 08/06/17  CBC with Differential/Platelet  Result Value Ref Range   WBC 7.8 4.0 - 10.5 K/uL   RBC 3.62 (L) 3.87 - 5.11 MIL/uL   Hemoglobin 10.9 (L) 12.0 - 15.0 g/dL   HCT 40.9 (L) 81.1 - 91.4 %   MCV 92.5 78.0 - 100.0 fL   MCH 30.1 26.0 - 34.0 pg   MCHC 32.5 30.0 - 36.0 g/dL   RDW 78.2 95.6 - 21.3 %   Platelets 264 150 - 400 K/uL   Neutrophils Relative % 82 %   Neutro Abs 6.5 1.7 - 7.7 K/uL   Lymphocytes Relative 10 %   Lymphs Abs 0.8 0.7 - 4.0 K/uL   Monocytes Relative 6 %   Monocytes Absolute 0.4 0.1 - 1.0 K/uL   Eosinophils Relative 2 %   Eosinophils Absolute 0.1 0.0 - 0.7 K/uL   Basophils Relative 0 %   Basophils Absolute 0.0 0.0 - 0.1 K/uL  Lipase, blood  Result Value Ref Range   Lipase 88 (H) 11 - 51 U/L  Hepatic function panel  Result Value Ref Range   Total Protein 6.8 6.5 - 8.1 g/dL   Albumin 3.8 3.5 - 5.0 g/dL   AST 18 15 - 41 U/L   ALT 11 0 - 44 U/L   Alkaline Phosphatase 55 38 - 126 U/L   Total Bilirubin 0.4 0.3 - 1.2 mg/dL   Bilirubin, Direct 0.1 0.0 - 0.2 mg/dL   Indirect Bilirubin 0.3 0.3 - 0.9 mg/dL  I-stat chem 8, ed  Result Value Ref Range     Sodium 142 135 - 145 mmol/L   Potassium 4.2  3.5 - 5.1 mmol/L   Chloride 105 98 - 111 mmol/L   BUN 29 (H) 8 - 23 mg/dL   Creatinine, Ser 1.61 (H) 0.44 - 1.00 mg/dL   Glucose, Bld 096 (H) 70 - 99 mg/dL   Calcium, Ion 0.45 4.09 - 1.40 mmol/L   TCO2 25 22 - 32 mmol/L   Hemoglobin 11.2 (L) 12.0 - 15.0 g/dL   HCT 81.1 (L) 91.4 - 78.2 %   No results found.  None  Radiology No results found.  Procedures Procedures (including critical care time)  Medications Ordered in ED Medications  sodium chloride 0.9 % bolus 500 mL (0 mLs Intravenous Stopped 08/07/17 0341)  gi cocktail (Maalox,Lidocaine,Donnatal) (30 mLs Oral Given 08/07/17 0227)  dicyclomine (BENTYL) injection 20 mg (20 mg Intramuscular Given 08/07/17 0352)  acetaminophen (TYLENOL) tablet 1,000 mg (1,000 mg Oral Given 08/07/17 0349)  ondansetron (ZOFRAN) injection 4 mg (4 mg Intravenous Given 08/07/17 0350)    Final Clinical Impressions(s) / ED Diagnoses  Follow up with GI for ongoing issues.  PO challenged successfully   Return for weakness, numbness, changes in vision or speech, fevers >100.4 unrelieved by medication, shortness of breath, intractable vomiting, or diarrhea, abdominal pain, Inability to tolerate liquids or food, cough, altered mental status or any concerns. No signs of systemic illness or infection. The patient is nontoxic-appearing on exam and vital signs are within normal limits. Will refer to urology for microscopy hematuria as patient is asymptomatic.    I have reviewed the triage vital signs and the nursing notes. Pertinent labs &imaging results that were available during my care of the patient were reviewed by me and considered in my medical decision making (see chart for details).  After history, exam, and medical workup I feel the patient has been appropriately medically screened and is safe for discharge home. Pertinent diagnoses were discussed with the patient. Patient was given return  precautions.    Rithwik Schmieg, MD 08/07/17 0400    Roxy Mastandrea, MD 08/07/17 [id number]

## 2017-08-07 NOTE — ED Notes (Signed)
Pt tolerating fluids with no difficulty  

## 2017-08-07 NOTE — ED Notes (Signed)
Pt told to let this nurse know when she feels that she can give a urine sample, unable to at this time

## 2017-09-29 ENCOUNTER — Observation Stay (HOSPITAL_COMMUNITY)
Admission: EM | Admit: 2017-09-29 | Discharge: 2017-10-01 | Disposition: A | Discharge status: Home / Self Care | Payer: 59 | Attending: Internal Medicine | Admitting: Internal Medicine

## 2017-09-29 ENCOUNTER — Encounter (HOSPITAL_COMMUNITY): Payer: Self-pay | Admitting: Emergency Medicine

## 2017-09-29 ENCOUNTER — Emergency Department (HOSPITAL_COMMUNITY): Payer: 59

## 2017-09-29 DIAGNOSIS — I10 Essential (primary) hypertension: Secondary | ICD-10-CM | POA: Diagnosis present

## 2017-09-29 DIAGNOSIS — N179 Acute kidney failure, unspecified: Secondary | ICD-10-CM | POA: Diagnosis not present

## 2017-09-29 DIAGNOSIS — Z79899 Other long term (current) drug therapy: Secondary | ICD-10-CM | POA: Insufficient documentation

## 2017-09-29 DIAGNOSIS — E785 Hyperlipidemia, unspecified: Secondary | ICD-10-CM | POA: Diagnosis not present

## 2017-09-29 DIAGNOSIS — Z96642 Presence of left artificial hip joint: Secondary | ICD-10-CM | POA: Diagnosis not present

## 2017-09-29 DIAGNOSIS — R079 Chest pain, unspecified: Secondary | ICD-10-CM | POA: Diagnosis not present

## 2017-09-29 DIAGNOSIS — R0789 Other chest pain: Principal | ICD-10-CM | POA: Insufficient documentation

## 2017-09-29 DIAGNOSIS — I350 Nonrheumatic aortic (valve) stenosis: Secondary | ICD-10-CM | POA: Diagnosis not present

## 2017-09-29 DIAGNOSIS — Z87891 Personal history of nicotine dependence: Secondary | ICD-10-CM | POA: Diagnosis not present

## 2017-09-29 DIAGNOSIS — I200 Unstable angina: Secondary | ICD-10-CM

## 2017-09-29 LAB — CBC
HCT: 34.5 % — ABNORMAL LOW (ref 36.0–46.0)
Hemoglobin: 10.9 g/dL — ABNORMAL LOW (ref 12.0–15.0)
MCH: 31 pg (ref 26.0–34.0)
MCHC: 31.6 g/dL (ref 30.0–36.0)
MCV: 98 fL (ref 78.0–100.0)
Platelets: 231 10*3/uL (ref 150–400)
RBC: 3.52 MIL/uL — ABNORMAL LOW (ref 3.87–5.11)
RDW: 14.6 % (ref 11.5–15.5)
WBC: 3.3 10*3/uL — ABNORMAL LOW (ref 4.0–10.5)

## 2017-09-29 LAB — DIFFERENTIAL
Abs Immature Granulocytes: 0 10*3/uL (ref 0.0–0.1)
Basophils Absolute: 0 10*3/uL (ref 0.0–0.1)
Basophils Relative: 1 %
Eosinophils Absolute: 0.1 10*3/uL (ref 0.0–0.7)
Eosinophils Relative: 4 %
Immature Granulocytes: 0 %
Lymphocytes Relative: 26 %
Lymphs Abs: 0.9 10*3/uL (ref 0.7–4.0)
Monocytes Absolute: 0.4 10*3/uL (ref 0.1–1.0)
Monocytes Relative: 12 %
Neutro Abs: 1.9 10*3/uL (ref 1.7–7.7)
Neutrophils Relative %: 57 %

## 2017-09-29 LAB — I-STAT TROPONIN, ED: Troponin i, poc: 0.01 ng/mL (ref 0.00–0.08)

## 2017-09-29 LAB — BASIC METABOLIC PANEL
Anion gap: 10 (ref 5–15)
BUN: 32 mg/dL — ABNORMAL HIGH (ref 8–23)
CO2: 25 mmol/L (ref 22–32)
Calcium: 9.5 mg/dL (ref 8.9–10.3)
Chloride: 106 mmol/L (ref 98–111)
Creatinine, Ser: 1.86 mg/dL — ABNORMAL HIGH (ref 0.44–1.00)
GFR calc Af Amer: 26 mL/min — ABNORMAL LOW (ref >60)
GFR calc non Af Amer: 23 mL/min — ABNORMAL LOW (ref >60)
Glucose, Bld: 105 mg/dL — ABNORMAL HIGH (ref 70–99)
Potassium: 4.1 mmol/L (ref 3.5–5.1)
Sodium: 141 mmol/L (ref 135–145)

## 2017-09-29 LAB — HEPATIC FUNCTION PANEL
ALT: 9 U/L (ref 0–44)
AST: 18 U/L (ref 15–41)
Albumin: 3.5 g/dL (ref 3.5–5.0)
Alkaline Phosphatase: 50 U/L (ref 38–126)
Bilirubin, Direct: <0.1 mg/dL (ref 0.0–0.2)
Total Bilirubin: 0.6 mg/dL (ref 0.3–1.2)
Total Protein: 6.9 g/dL (ref 6.5–8.1)

## 2017-09-29 LAB — TROPONIN I
Troponin I: <0.03 ng/mL (ref <0.03)
Troponin I: <0.03 ng/mL (ref <0.03)

## 2017-09-29 MED ORDER — LOSARTAN POTASSIUM 50 MG PO TABS: 100.0000 mg | ORAL_TABLET | Freq: Every day | ORAL | Status: DC

## 2017-09-29 MED ORDER — CALCIUM CARBONATE ANTACID 500 MG PO CHEW
1.0000 | CHEWABLE_TABLET | Freq: Every day | ORAL | Status: DC | PRN
Start: 2017-09-29 — End: 2017-10-01
  Administered 2017-09-29 – 2017-09-30 (×2): 200 mg via ORAL
  Filled 2017-09-29 (×2): qty 1

## 2017-09-29 MED ORDER — ISOSORBIDE MONONITRATE ER 30 MG PO TB24
30.0000 mg | ORAL_TABLET | Freq: Every day | ORAL | Status: DC
  Administered 2017-09-30 – 2017-10-01 (×2): 30 mg via ORAL
  Filled 2017-09-29 (×2): qty 1

## 2017-09-29 MED ORDER — ADULT MULTIVITAMIN W/MINERALS CH
1.0000 | ORAL_TABLET | Freq: Every day | ORAL | Status: DC
  Administered 2017-09-30 – 2017-10-01 (×2): 1 via ORAL
  Filled 2017-09-29 (×2): qty 1

## 2017-09-29 MED ORDER — POLYETHYLENE GLYCOL 3350 17 GM/SCOOP PO POWD
17.0000 g | Freq: Every day | ORAL | Status: DC | PRN
Start: 2017-09-29 — End: 2017-10-01
  Filled 2017-09-29: qty 255

## 2017-09-29 MED ORDER — LOSARTAN POTASSIUM-HCTZ 100-12.5 MG PO TABS: 1.0000 | ORAL_TABLET | Freq: Every day | ORAL | Status: DC

## 2017-09-29 MED ORDER — VITAMIN D 1000 UNITS PO TABS
2000.0000 [IU] | ORAL_TABLET | Freq: Every day | ORAL | Status: DC
  Administered 2017-09-30: 2000 [IU] via ORAL
  Filled 2017-09-29 (×2): qty 2

## 2017-09-29 MED ORDER — FUROSEMIDE 40 MG PO TABS
40.0000 mg | ORAL_TABLET | Freq: Every day | ORAL | Status: DC
  Administered 2017-09-30: 40 mg via ORAL
  Filled 2017-09-29: qty 1

## 2017-09-29 MED ORDER — ACETAMINOPHEN 325 MG PO TABS
650.0000 mg | ORAL_TABLET | ORAL | Status: DC | PRN
Start: 2017-09-29 — End: 2017-10-01

## 2017-09-29 MED ORDER — NITROGLYCERIN 0.4 MG SL SUBL: 0.4000 mg | SUBLINGUAL_TABLET | SUBLINGUAL | Status: DC | PRN

## 2017-09-29 MED ORDER — ONDANSETRON HCL 4 MG/2ML IJ SOLN: 4.0000 mg | Freq: Four times a day (QID) | INTRAMUSCULAR | Status: DC | PRN

## 2017-09-29 MED ORDER — HYDROCHLOROTHIAZIDE 12.5 MG PO CAPS
12.5000 mg | ORAL_CAPSULE | Freq: Every day | ORAL | Status: DC
  Administered 2017-09-30: 12.5 mg via ORAL
  Filled 2017-09-29: qty 1

## 2017-09-29 MED ORDER — FERROUS SULFATE 325 (65 FE) MG PO TABS
325.0000 mg | ORAL_TABLET | Freq: Every day | ORAL | Status: DC
  Administered 2017-09-30 – 2017-10-01 (×2): 325 mg via ORAL
  Filled 2017-09-29 (×2): qty 1

## 2017-09-29 NOTE — ED Provider Notes (Signed)
[facility] Provider Note   CSN: [id number] Arrival date & time: 09/29/17  1218     History   Chief Complaint Chief Complaint  Patient presents with  . Chest Pain    HPI [name] is a 84 y.o. female.  Patient complains of worsening chest discomfort over the last week.  She was having some chest pain a week ago then got worse with exertion and today she had pain with rest that she had to take 3 nitros 4.  Patient no longer has discomfort now.  Patient has a history of aortic stenosis  The history is provided by the patient. No language interpreter was used.  Chest Pain   This is a new problem. The current episode started more than 1 week ago. The problem occurs rarely. The problem has been resolved. The pain is associated with exertion. The pain is present in the substernal region. The pain is at a severity of 7/10. The pain is moderate. The quality of the pain is described as exertional. The pain does not radiate. The symptoms are aggravated by exertion. Pertinent negatives include no abdominal pain, no back pain, no cough and no headaches. She has tried rest and nitroglycerin for the symptoms. The treatment provided significant relief. Risk factors: Aortic stenosis.  Pertinent negatives for past medical history include no seizures.    Past Medical History:  Diagnosis Date  . Acid reflux    takes Nexium daily  . Anemia    takes Ferrous Sulfate daily  . Aortic stenosis    mild AS pk grad 18, mean grad 9, AVA 1.32 cm 03/2015 echo (Dr. Jacinto HalimGanji)  . Arthritis   . Chronic kidney disease (CKD), stage III (moderate) (HCC)   . Diverticulitis   . Hematochezia 03/2015  . High cholesterol    can't take the meds  . History of blood transfusion 03/2015   no abnormal reaction noted  . History of GI diverticular bleed   . HTN (hypertension)    takes Losartan-HCTZ and Metoprolol daily  . Joint pain   . Joint swelling   . Nocturia   . Numbness      in legs  . Peripheral edema    occasionally  . Shortness of breath dyspnea    occasionally and with exertion  . Slow urinary stream    at times    Patient Active Problem List   Diagnosis Date Noted  . Acute lower GI bleeding 03/06/2017  . Diverticula, colon 03/06/2017  . HTN (hypertension) 03/06/2017  . High cholesterol 03/06/2017  . Acute kidney injury (HCC) 03/06/2017  . Acute blood loss anemia 03/06/2017  . Hiatal hernia 11/28/2016  . Chest pain, rule out acute myocardial infarction 11/27/2016  . Hyperlipidemia 05/24/2015  . Aortic stenosis 05/24/2015  . GERD without esophagitis 05/23/2015  . Postoperative anemia due to acute blood loss 05/23/2015  . Osteoarthritis of left hip 05/17/2015  . 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 GI bleed 02/21/2015  . Acute bronchitis 11/16/2012  . Hypertension 11/13/2012  . Bright red blood per rectum 11/13/2012  . Lower GI bleed 02/23/2012  . History of GI diverticular bleed 02/23/2012  . Diverticulosis 02/23/2012    Past Surgical History:  Procedure Laterality Date  . ABCESS DRAINAGE     abdominal abcess  . cataract surgery Bilateral   . CHOLECYSTECTOMY    . DILATION AND CURETTAGE OF UTERUS    . ESOPHAGOGASTRODUODENOSCOPY N/A 02/23/2015  Procedure: ESOPHAGOGASTRODUODENOSCOPY (EGD);  Surgeon: Carman Ching, MD;  Location: [facility] ENDOSCOPY;  Service: Endoscopy;  Laterality: N/A;  . FLEXIBLE SIGMOIDOSCOPY N/A 02/24/2015   Procedure: FLEXIBLE SIGMOIDOSCOPY;  Surgeon: Carman Ching, MD;  Location[facility] ENDOSCOPY;  Service: Endoscopy;  Laterality: N/A;  . IR ANGIOGRAM SELECTIVE EACH ADDITIONAL VESSEL  03/06/2017  . IR ANGIOGRAM SELECTIVE EACH ADDITIONAL VESSEL  03/06/2017  . IR ANGIOGRAM SELECTIVE EACH ADDITIONAL VESSEL  03/06/2017  . IR ANGIOGRAM SELECTIVE EACH ADDITIONAL VESSEL  03/08/2017  . IR ANGIOGRAM VISCERAL SELECTIVE  03/06/2017  . IR ANGIOGRAM VISCERAL SELECTIVE  03/06/2017  . IR ANGIOGRAM VISCERAL SELECTIVE   03/06/2017  . IR ANGIOGRAM VISCERAL SELECTIVE  03/08/2017  . IR ANGIOGRAM VISCERAL SELECTIVE  03/08/2017  . IR EMBO ART  VEN HEMORR LYMPH EXTRAV  INC GUIDE ROADMAPPING  03/06/2017  . IR EMBO ART  VEN HEMORR LYMPH EXTRAV  INC GUIDE ROADMAPPING  03/08/2017  . IR US GUIDE VASC ACCESS RIGHT  03/06/2017  . IR US GUIDE VASC ACCESS RIGHT  03/08/2017  . left knee surgery    . TOTAL HIP ARTHROPLASTY Left 05/17/2015   Procedure: LEFT TOTAL HIP ARTHROPLASTY ANTERIOR APPROACH;  Surgeon: Kathryne Hitch, MD;  Location: [facility] OR;  Service: Orthopedics;  Laterality: Left;     OB History   None      Home Medications    Prior to Admission medications   Medication Sig Start Date End Date Taking? Authorizing Provider  acetaminophen (TYLENOL) 325 MG tablet Take 325 mg by mouth every 6 (six) hours as needed. pain    [provider]  calcium carbonate (TUMS - DOSED IN MG ELEMENTAL CALCIUM) 500 MG chewable tablet Chew 1 tablet by mouth daily as needed for indigestion or heartburn.    [provider]  Cholecalciferol (VITAMIN D3) 2000 units TABS Take 2,000 Units by mouth daily.    [provider]  diclofenac sodium (VOLTAREN) 1 % GEL Apply 2 g topically 2 (two) times daily.    [provider]  ferrous sulfate 325 (65 FE) MG tablet Take 325 mg by mouth daily with breakfast.    [provider]  furosemide (LASIX) 40 MG tablet Take 40 mg by mouth daily. 01/14/17   [provider]  losartan-hydrochlorothiazide (HYZAAR) 100-12.5 MG tablet Take 1 tablet by mouth daily.    [provider]  metoprolol tartrate (LOPRESSOR) 25 MG tablet Take 0.5 tablets (12.5 mg total) by mouth 2 (two) times daily. Patient taking differently: Take 25 mg by mouth 2 (two) times daily.  03/12/17   Tyrone Nine, MD  Multiple Vitamin (MULITIVITAMIN WITH MINERALS) TABS Take 1 tablet by mouth daily.    [provider]  nitroGLYCERIN (NITROSTAT) 0.4 MG SL tablet Place 0.4 mg  under the tongue every 5 (five) minutes as needed for chest pain.     [provider]  ondansetron (ZOFRAN ODT) 8 MG disintegrating tablet 8mg  ODT q8 hours prn nausea 08/07/17   Palumbo, April, MD  polyethylene glycol powder (GLYCOLAX/MIRALAX) powder Take 17 g by mouth daily as needed for mild constipation.    [provider]  PROAIR HFA 108 [phone] Base) MCG/ACT inhaler Inhale 1-2 puffs into the lungs every 4 (four) hours as needed for shortness of breath or wheezing. 12/04/16   [provider]    Family History Family History  Problem Relation Age of Onset  . Diabetes Other   . Hypertension Other   . Cancer Other   . Heart disease Other  Social History Social History   Tobacco Use  . Smoking status: Former Games developer  . Smokeless tobacco: Never Used  . Tobacco comment: quit smoking 8+ yrs ago  Substance Use Topics  . Alcohol use: No    Comment: quit yrs ago  . Drug use: No     Allergies   Ezetimibe; Lipitor [atorvastatin]; Pravachol [pravastatin]; Statins; Zocor [simvastatin]; and Cholestyramine   Review of Systems Review of Systems  Constitutional: Negative for appetite change and fatigue.  HENT: Negative for congestion, ear discharge and sinus pressure.   Eyes: Negative for discharge.  Respiratory: Negative for cough.   Cardiovascular: Positive for chest pain.  Gastrointestinal: Negative for abdominal pain and diarrhea.  Genitourinary: Negative for frequency and hematuria.  Musculoskeletal: Negative for back pain.  Skin: Negative for rash.  Neurological: Negative for seizures and headaches.  Psychiatric/Behavioral: Negative for hallucinations.     Physical Exam Updated Vital Signs BP (!) 156/61   Pulse (!) 52   Temp 98.9 F (37.2 C)   Resp 18   Ht 5\' 3"  (1.6 m)   SpO2 100%   BMI 37.84 kg/m   Physical Exam  Constitutional: She is oriented to person, place, and time. She appears well-developed.  HENT:  Head: Normocephalic.  Eyes:  Conjunctivae and EOM are normal. No scleral icterus.  Neck: Neck supple. No thyromegaly present.  Cardiovascular: Normal rate and regular rhythm. Exam reveals no gallop and no friction rub.  No murmur heard. Pulmonary/Chest: No stridor. She has no wheezes. She has no rales. She exhibits no tenderness.  Abdominal: She exhibits no distension. There is no tenderness. There is no rebound.  Musculoskeletal: Normal range of motion. She exhibits no edema.  Lymphadenopathy:    She has no cervical adenopathy.  Neurological: She is oriented to person, place, and time. She exhibits normal muscle tone. Coordination normal.  Skin: No rash noted. No erythema.  Psychiatric: She has a normal mood and affect. Her behavior is normal.     ED Treatments / Results  Labs (all labs ordered are listed, but only abnormal results are displayed) Labs Reviewed  BASIC METABOLIC PANEL - Abnormal; Notable for the following components:      Result Value   Glucose, Bld 105 (*)    BUN 32 (*)    Creatinine, Ser 1.86 (*)    GFR calc non Af Amer 23 (*)    GFR calc Af Amer 26 (*)    All other components within normal limits  CBC - Abnormal; Notable for the following components:   WBC 3.3 (*)    RBC 3.52 (*)    Hemoglobin 10.9 (*)    HCT 34.5 (*)    All other components within normal limits  HEPATIC FUNCTION PANEL  DIFFERENTIAL  I-STAT TROPONIN, ED    EKG EKG Interpretation  Date/Time:  Sunday September 29 2017 12:27:19 EDT Ventricular Rate:  54 PR Interval:  248 QRS Duration: 82 QT Interval:  434 QTC Calculation: 411 R Axis:   39 Text Interpretation:  Sinus bradycardia with 1st degree A-V block Septal infarct , age undetermined Abnormal ECG Confirmed by Bethann Berkshire [phone]) on 09/29/2017 1:00:25 PM Also confirmed by Bethann Berkshire [phone])  on 09/29/2017 2:44:23 PM   Radiology Dg Chest 2 View  Result Date: 09/29/2017 CLINICAL DATA:  Acute chest pain for 2 days. EXAM: CHEST - 2 VIEW COMPARISON:   11/27/2016 and prior radiographs.  11/15/2012 chest CT FINDINGS: The cardiomediastinal silhouette is unchanged with moderate to large hiatal hernia  again noted. There is no evidence of focal airspace disease, pulmonary edema, suspicious pulmonary nodule/mass, pleural effusion, or pneumothorax. No acute bony abnormalities are identified. IMPRESSION: No evidence of acute abnormality. Moderate to large hiatal hernia. Electronically Signed   By: Harmon PierJeffrey  Hu M.D.   On: 09/29/2017 13:30    Procedures Procedures (including critical care time)  Medications Ordered in ED Medications - No data to display   Initial Impression / Assessment and Plan / ED Course  I have reviewed the triage vital signs and the nursing notes.  Pertinent labs & imaging results that were available during my care of the patient were reviewed by me and considered in my medical decision making (see chart for details).     Patient with history of aortic stenosis.  Patient with chest pain that relieved by nitro.  Labs unremarkable EKG shows no acute changes.  Patient will be admitted to medicine for further work-up exertional chest pain Final Clinical Impressions(s) / ED Diagnoses   Final diagnoses:  Unstable angina pectoris [facility](HCC)    ED Discharge Orders    None       Bethann BerkshireZammit, Shawnique Mariotti, MD 09/29/17 1535

## 2017-09-29 NOTE — ED Triage Notes (Signed)
Pt has had intermittent chest pain for 2 days to the left chest left breast that radiates into the left arm. Pt pain was 6/10 but she took 3 nitro at home and now it is 0/10.

## 2017-09-29 NOTE — H&P (Signed)
History and Physical    [name] WNU:[id number] DOB: 02/08/1924 DOA: 09/29/2017  PCP: Fleet ContrasAvbuere, Edwin, MD   Patient coming from: home     Chief CoWilder Glademplaint: chest pain  HPI: Brenda GladeBertha C [name] is a 84 y.o. female with medical history significant of mild aortic stenosis, stage III CKD, hypertension, history of GI bleed, who comes in with chest pain.  Patient reports that she normally has sharp pinging pain over the central part of her chest that radiates to her left arm and sometimes her right arm.  She occasionally takes sublingual nitroglycerin for this.  Today the pain became much more significant and it was at rest.  Patient took 3 sublingual nitro and lay down with resolution of the pain.  Patient is somewhat mobile and does have some chest pain when she walks but it also occurs when she is not walking.  She does have chronic shortness of breath and dyspnea on exertion.  She does not have any orthopnea or.  She does report some lower extremity edema however it also waxes and wanes.  She has not had any fevers, cough, congestion, rhinorrhea, nausea, vomiting, diarrhea, abdominal pain.  ED Course: In the ED patient's vitals were notable for sinus bradycardia.  Episode creatinine of 1.86 which is baseline.  Remaining labs were unremarkable.  Troponin was negative x1.  Review of Systems: As per HPI otherwise 10 point review of systems negative.  Labs showed chronic anemia, normal troponin, trending to 1.86 which is around her baseline.  By arrival to the ED patient's chest pain resolved.   Past Medical History:  Diagnosis Date  . Acid reflux    takes Nexium daily  . Anemia    takes Ferrous Sulfate daily  . Aortic stenosis    mild AS pk grad 18, mean grad 9, AVA 1.32 cm 03/2015 echo (Dr. Jacinto HalimGanji)  . Arthritis   . Chronic kidney disease (CKD), stage III (moderate) (HCC)   . Diverticulitis   . Hematochezia 03/2015  . High cholesterol    can't take the meds  . History of blood transfusion  03/2015   no abnormal reaction noted  . History of GI diverticular bleed   . HTN (hypertension)    takes Losartan-HCTZ and Metoprolol daily  . Joint pain   . Joint swelling   . Nocturia   . Numbness    in legs  . Peripheral edema    occasionally  . Shortness of breath dyspnea    occasionally and with exertion  . Slow urinary stream    at times    Past Surgical History:  Procedure Laterality Date  . ABCESS DRAINAGE     abdominal abcess  . cataract surgery Bilateral   . CHOLECYSTECTOMY    . DILATION AND CURETTAGE OF UTERUS    . ESOPHAGOGASTRODUODENOSCOPY N/A 02/23/2015   Procedure: ESOPHAGOGASTRODUODENOSCOPY (EGD);  Surgeon: Carman ChingJames Edwards, MD;  [facility];  Service: Endoscopy;  Laterality: N/A;  . FLEXIBLE SIGMOIDOSCOPY N/A 02/24/2015   Procedure: FLEXIBLE SIGMOIDOSCOPY;  Surgeon: Carman ChingJames Edwards, MD;  [facility];  Service: Endoscopy;  Laterality: N/A;  . IR ANGIOGRAM SELECTIVE EACH ADDITIONAL VESSEL  03/06/2017  . IR ANGIOGRAM SELECTIVE EACH ADDITIONAL VESSEL  03/06/2017  . IR ANGIOGRAM SELECTIVE EACH ADDITIONAL VESSEL  03/06/2017  . IR ANGIOGRAM SELECTIVE EACH ADDITIONAL VESSEL  03/08/2017  . IR ANGIOGRAM VISCERAL SELECTIVE  03/06/2017  . IR ANGIOGRAM VISCERAL SELECTIVE  03/06/2017  . IR ANGIOGRAM VISCERAL SELECTIVE  03/06/2017  . IR ANGIOGRAM  VISCERAL SELECTIVE  03/08/2017  . IR ANGIOGRAM VISCERAL SELECTIVE  03/08/2017  . IR EMBO ART  VEN HEMORR LYMPH EXTRAV  INC GUIDE ROADMAPPING  03/06/2017  . IR EMBO ART  VEN HEMORR LYMPH EXTRAV  INC GUIDE ROADMAPPING  03/08/2017  . IR US GUIDE VASC ACCESS RIGHT  03/06/2017  . IR US GUIDE VASC ACCESS RIGHT  03/08/2017  . left knee surgery    . TOTAL HIP ARTHROPLASTY Left 05/17/2015   Procedure: LEFT TOTAL HIP ARTHROPLASTY ANTERIOR APPROACH;  Surgeon: Kathryne Hitchhristopher Y Blackman, MD;  Location: [facility] OR;  Service: Orthopedics;  Laterality: Left;     reports that she has quit smoking. She has never used smokeless tobacco. She reports that  she does not drink alcohol or use drugs.  Allergies  Allergen Reactions  . Ezetimibe Anaphylaxis  . Lipitor [Atorvastatin] Swelling  . Pravachol [Pravastatin] Swelling  . Statins Swelling and Other (See Comments)    Swelling of mouth and lips  . Zocor [Simvastatin] Swelling  . Cholestyramine Nausea And Vomiting    Family History  Problem Relation Age of Onset  . Diabetes Other   . Hypertension Other   . Cancer Other   . Heart disease Other    Unacceptable: Noncontributory, unremarkable, or negative. Acceptable: Family history reviewed and not pertinent (If you reviewed it)  Prior to Admission medications   Medication Sig Start Date End Date Taking? Authorizing Provider  acetaminophen (TYLENOL) 325 MG tablet Take 325 mg by mouth every 6 (six) hours as needed. pain    [provider]  calcium carbonate (TUMS - DOSED IN MG ELEMENTAL CALCIUM) 500 MG chewable tablet Chew 1 tablet by mouth daily as needed for indigestion or heartburn.    [provider]  Cholecalciferol (VITAMIN D3) 2000 units TABS Take 2,000 Units by mouth daily.    [provider]  diclofenac sodium (VOLTAREN) 1 % GEL Apply 2 g topically 2 (two) times daily.    [provider]  ferrous sulfate 325 (65 FE) MG tablet Take 325 mg by mouth daily with breakfast.    [provider]  furosemide (LASIX) 40 MG tablet Take 40 mg by mouth daily. 01/14/17   [provider]  losartan-hydrochlorothiazide (HYZAAR) 100-12.5 MG tablet Take 1 tablet by mouth daily.    [provider]  metoprolol tartrate (LOPRESSOR) 25 MG tablet Take 0.5 tablets (12.5 mg total) by mouth 2 (two) times daily. Patient taking differently: Take 25 mg by mouth 2 (two) times daily.  03/12/17   Tyrone NineGrunz, Ryan B, MD  Multiple Vitamin (MULITIVITAMIN WITH MINERALS) TABS Take 1 tablet by mouth daily.    [provider]  nitroGLYCERIN (NITROSTAT) 0.4 MG SL tablet Place 0.4 mg under the tongue every 5  (five) minutes as needed for chest pain.     [provider]  ondansetron (ZOFRAN ODT) 8 MG disintegrating tablet 8mg  ODT q8 hours prn nausea 08/07/17   Palumbo, April, MD  polyethylene glycol powder (GLYCOLAX/MIRALAX) powder Take 17 g by mouth daily as needed for mild constipation.    [provider]  PROAIR HFA 108 [phone](90 Base) MCG/ACT inhaler Inhale 1-2 puffs into the lungs every 4 (four) hours as needed for shortness of breath or wheezing. 12/04/16   [provider]    Physical Exam: Vitals:   09/29/17 1330 09/29/17 1345 09/29/17 1400 09/29/17 1415  BP: 127/64 137/61 (!) 135/58 (!) 156/61  Pulse: (!) 50 (!) 50 (!) 50 (!) 52  Resp: 16 12 (!) 24  18  Temp:      SpO2: 100% 100% 97% 100%  Height:        Constitutional: NAD, calm, comfortable Vitals:   09/29/17 1330 09/29/17 1345 09/29/17 1400 09/29/17 1415  BP: 127/64 137/61 (!) 135/58 (!) 156/61  Pulse: (!) 50 (!) 50 (!) 50 (!) 52  Resp: 16 12 (!) 24 18  Temp:      SpO2: 100% 100% 97% 100%  Height:       Eyes: Anicteric sclera ENMT: Mucous membranes are moist.  Edentulous Neck: normal, supple Respiratory: clear to auscultation bilaterally, no wheezing, no crackles. Normal respiratory effort. No accessory muscle use.  Cardiovascular: Tachycardic, regular rhythm, no murmurs Abdomen: no tenderness, no masses palpated. No hepatosplenomegaly. Bowel sounds positive.  Musculoskeletal: Plus lower extremity edema Skin: no rashes visible skin Neurologic: Intact, moving all extremities Psychiatric: Normal judgment and insight. Alert and oriented x 3. Normal mood.   Labs on Admission: I have personally reviewed following labs and imaging studies  CBC: Recent Labs  Lab 09/29/17 1251  WBC 3.3*  NEUTROABS 1.9  HGB 10.9*  HCT 34.5*  MCV 98.0  PLT 231   Basic Metabolic Panel: Recent Labs  Lab 09/29/17 1251  NA 141  K 4.1  CL 106  CO2 25  GLUCOSE 105*  BUN 32*  CREATININE 1.86*  CALCIUM 9.5    GFR: CrCl cannot be calculated (Unknown ideal weight.). Liver Function Tests: Recent Labs  Lab 09/29/17 1307  AST 18  ALT 9  ALKPHOS 50  BILITOT 0.6  PROT 6.9  ALBUMIN 3.5   No results for input(s): LIPASE, AMYLASE in the last 168 hours. No results for input(s): AMMONIA in the last 168 hours. Coagulation Profile: No results for input(s): INR, PROTIME in the last 168 hours. Cardiac Enzymes: No results for input(s): CKTOTAL, CKMB, CKMBINDEX, TROPONINI in the last 168 hours. BNP (last 3 results) No results for input(s): PROBNP in the last 8760 hours. HbA1C: No results for input(s): HGBA1C in the last 72 hours. CBG: No results for input(s): GLUCAP in the last 168 hours. Lipid Profile: No results for input(s): CHOL, HDL, LDLCALC, TRIG, CHOLHDL, LDLDIRECT in the last 72 hours. Thyroid Function Tests: No results for input(s): TSH, T4TOTAL, FREET4, T3FREE, THYROIDAB in the last 72 hours. Anemia Panel: No results for input(s): VITAMINB12, FOLATE, FERRITIN, TIBC, IRON, RETICCTPCT in the last 72 hours. Urine analysis:    Component Value Date/Time   COLORURINE YELLOW 09/08/2014 1319   APPEARANCEUR CLEAR 09/08/2014 1319   LABSPEC 1.018 09/08/2014 1319   PHURINE 7.0 09/08/2014 1319   GLUCOSEU NEGATIVE 09/08/2014 1319   HGBUR TRACE (A) 09/08/2014 1319   BILIRUBINUR NEGATIVE 09/08/2014 1319   KETONESUR NEGATIVE 09/08/2014 1319   PROTEINUR NEGATIVE 09/08/2014 1319   UROBILINOGEN 1.0 09/08/2014 1319   NITRITE NEGATIVE 09/08/2014 1319   LEUKOCYTESUR NEGATIVE 09/08/2014 1319    Radiological Exams on Admission: Dg Chest 2 View  Result Date: 09/29/2017 CLINICAL DATA:  Acute chest pain for 2 days. EXAM: CHEST - 2 VIEW COMPARISON:  11/27/2016 and prior radiographs.  11/15/2012 chest CT FINDINGS: The cardiomediastinal silhouette is unchanged with moderate to large hiatal hernia again noted. There is no evidence of focal airspace disease, pulmonary edema, suspicious pulmonary  nodule/mass, pleural effusion, or pneumothorax. No acute bony abnormalities are identified. IMPRESSION: No evidence of acute abnormality. Moderate to large hiatal hernia. Electronically Signed   By: Harmon Pier M.D.   On: 09/29/2017 13:30    EKG: Independently reviewed.  Sinus bradycardia,  no acute ST segment changes  Assessment/Plan Active Problems:   Status post total replacement of left hip   Hyperlipidemia   Aortic stenosis   Chest pain, rule out acute myocardial infarction   HTN (hypertension)    #) Chest pain: Review of the chart patient has had prior admissions proximately 1.5 years ago for chest pain that was attributed to possibly her large hiatal hernia.  Today her chest x-ray additionally shows this large hiatal hernia.  It is not clear per her description if this is certainly anginal or not.  Regardless it certainly does not appear to be classic for angina.  Additionally it is unclear the utility of interventional testing or indeed intervention particularly with her history of significant GI bleeding requiring coil localization. -Cycle troponins -Telemetry -Echo ordered -We will start long-acting nitrate isosorbide mononitrate 30 mg daily  #) Mild aortic stenosis: Noted from remote history of echo - Echo ordered  e-continue 40 mg Lasix daily  #) hypertension/hyperlipidemia: -Hold metoprolol tartrate 25 mg twice daily due to bradycardia - Start long-acting nitrate per above -Continue losartan-HCTZ 100-12.5 mg daily  #) COPD/asthma: -Continue PRN bronchodilators  #) Iron deficiency anemia: -Continue daily iron  Fluids: Tolerating p.o. Electrolyte: Monitor and supplement Nutrition: Heart healthy diet  Prophylaxis: SCDs  Disposition: Pending echo and cycling troponins  DO NOT RESUSCITATE   Delaine Lame MD [facility]   If 7PM-7AM, please contact night-coverage [email or web address] Password [facility]  09/29/2017, 3:17 PM

## 2017-09-29 NOTE — ED Notes (Signed)
ED Provider at bedside. 

## 2017-09-30 ENCOUNTER — Other Ambulatory Visit: Payer: Self-pay

## 2017-09-30 ENCOUNTER — Encounter (HOSPITAL_COMMUNITY): Payer: Self-pay

## 2017-09-30 ENCOUNTER — Observation Stay (HOSPITAL_BASED_OUTPATIENT_CLINIC_OR_DEPARTMENT_OTHER): Payer: 59

## 2017-09-30 DIAGNOSIS — I361 Nonrheumatic tricuspid (valve) insufficiency: Secondary | ICD-10-CM

## 2017-09-30 DIAGNOSIS — I10 Essential (primary) hypertension: Secondary | ICD-10-CM | POA: Diagnosis not present

## 2017-09-30 DIAGNOSIS — I350 Nonrheumatic aortic (valve) stenosis: Secondary | ICD-10-CM | POA: Diagnosis not present

## 2017-09-30 DIAGNOSIS — I200 Unstable angina: Secondary | ICD-10-CM | POA: Diagnosis not present

## 2017-09-30 DIAGNOSIS — R079 Chest pain, unspecified: Secondary | ICD-10-CM | POA: Diagnosis not present

## 2017-09-30 DIAGNOSIS — N179 Acute kidney failure, unspecified: Secondary | ICD-10-CM | POA: Diagnosis not present

## 2017-09-30 DIAGNOSIS — E785 Hyperlipidemia, unspecified: Secondary | ICD-10-CM

## 2017-09-30 DIAGNOSIS — R0789 Other chest pain: Secondary | ICD-10-CM | POA: Diagnosis not present

## 2017-09-30 LAB — ECHOCARDIOGRAM COMPLETE
Height: 63 in
Weight: 3067.2 oz

## 2017-09-30 LAB — TROPONIN I: Troponin I: <0.03 ng/mL (ref <0.03)

## 2017-09-30 NOTE — Progress Notes (Signed)
  Echocardiogram 2D Echocardiogram has been performed.  Brenda SavoyCasey N Ramiro [name] 09/30/2017, 9:03 AM

## 2017-09-30 NOTE — Plan of Care (Signed)
  Problem: Clinical Measurements: Goal: Respiratory complications will improve Outcome: Completed/Met

## 2017-09-30 NOTE — Care Management Note (Signed)
Case Management Note  Patient Details  Name: [name] MRN: [id number] Date of Birth: 05/15/1923  Subjective/Objective:  84 year old female with a hx of mild aortic stenosis, CKD Stage III, HTN and hx of GI bleed who [facility] with complaints of chest pain. Patient reported living at home alone, ambulating with a cane, drives and independent with all ADLs.                 Action/Plan: CM will continue to follow for additional needs.   Expected Discharge Date:  10/01/17               Expected Discharge Plan:  Home/Self Care  In-House Referral:  NA  Discharge planning Services  CM Consult  Post Acute Care Choice:  NA Choice offered to:  NA  DME Arranged:  N/A DME Agency:  NA  HH Arranged:  NA HH Agency:  NA  Status of Service:  Completed, signed off  If discussed at Long Length of Stay Meetings, dates discussed:    Additional Comments:  Lollie Marrowatalie Y Nasiir Monts, RN 09/30/2017, 3:15 PM

## 2017-09-30 NOTE — Progress Notes (Signed)
Progress Note    [name]  ZOX:[id number] DOB: 10/17/1923  DOA: 09/29/2017 PCP: [name], Edwin, MD    Brief Narrative:    Medical records reviewed and are as summarized below:  [name] is an 84 y.o. female with medical history significant of mild aortic stenosis, stage III CKD, hypertension, history of GI bleed, who comes in with chest pain.  Patient reports that she normally has sharp pinging pain over the central part of her chest that radiates to her left arm and sometimes her right arm.    Assessment/Plan:   Active Problems:   Status post total replacement of left hip   Hyperlipidemia   Aortic stenosis   Chest pain, rule out acute myocardial infarction   HTN (hypertension)   Chest pain  Chest pain: -approximately 1.5 years ago for chest pain that was attributed to possibly her large hiatal hernia.  Today her chest x-ray additionally shows this large hiatal hernia.   -long-acting nitrate isosorbide mononitrate 30 mg daily  Mild aortic stenosis: Noted from remote history of echo - Echo:Left ventricle: The cavity size was normal. Systolic function was   normal. The estimated ejection fraction was in the range of 55%   to 60%. Wall motion was normal; there were no regional wall   motion abnormalities. Features are consistent with a pseudonormal   left ventricular filling pattern, with concomitant abnormal   relaxation and increased filling pressure (grade 2 diastolic   dysfunction). Doppler parameters are consistent with high   ventricular filling pressure.  AKI -check U/A -bladder scan -hold lasix -d/c losartan/HCTZ   hypertension/hyperlipidemia: -Hold metoprolol tartrate 25 mg twice daily due to bradycardia - Start long-acting nitrate per above -hold losartan-HCTZ 100-12.5 mg daily -monitor blood pressure  COPD/asthma: -Continue PRN bronchodilators   Iron deficiency anemia: -Continue daily iron  obesity Body mass index is 33.96  kg/m.   Family Communication/Anticipated D/C date and plan/Code Status   DVT prophylaxis: Lovenox ordered. Code Status: DNR Family Communication: at bedside Disposition Plan: home if CR stable   Medical Consultants:    cards   Anti-Infectives:    None  Subjective:  Feeling well   Objective:    Vitals:   09/29/17 2000 09/30/17 0338 09/30/17 1341 09/30/17 1454  BP: (!) 152/81 130/61 104/60 (!) 91/56  Pulse: 60 (!) 55 64 64  Resp: 19 18    Temp: 98.2 F (36.8 C) 98.1 F (36.7 C) (!) 97.5 F (36.4 C) 98.7 F (37.1 C)  TempSrc: Oral Oral Oral Oral  SpO2: 100% 99% 100% 100%  Weight:  87 kg    Height:        Intake/Output Summary (Last 24 hours) at 09/30/2017 1539 Last data filed at 09/30/2017 1341 Gross per 24 hour  Intake 462 ml  Output -  Net 462 ml   Filed Weights   09/29/17 1754 09/30/17 0338  Weight: 87.4 kg 87 kg    Exam: In bed, NAD rrr Clear +Bs, soft No rashes or lesions  Data Reviewed:   I have personally reviewed following labs and imaging studies:  Labs: Labs show the following:   Basic Metabolic Panel: Recent Labs  Lab 09/29/17 1251  NA 141  K 4.1  CL 106  CO2 25  GLUCOSE 105*  BUN 32*  CREATININE 1.86*  CALCIUM 9.5   GFR Estimated Creatinine Clearance: 20.6 mL/min (A) (by C-G formula based on SCr of 1.86 mg/dL (H)). Liver Function Tests: Recent Labs  Lab 09/29/17  1307  AST 18  ALT 9  ALKPHOS 50  BILITOT 0.6  PROT 6.9  ALBUMIN 3.5   No results for input(s): LIPASE, AMYLASE in the last 168 hours. No results for input(s): AMMONIA in the last 168 hours. Coagulation profile No results for input(s): INR, PROTIME in the last 168 hours.  CBC: Recent Labs  Lab 09/29/17 1251  WBC 3.3*  NEUTROABS 1.9  HGB 10.9*  HCT 34.5*  MCV 98.0  PLT 231   Cardiac Enzymes: Recent Labs  Lab 09/29/17 1824 09/29/17 2120 09/30/17 0334  TROPONINI <0.03 <0.03 <0.03   BNP (last 3 results) No results for input(s): PROBNP  in the last 8760 hours. CBG: No results for input(s): GLUCAP in the last 168 hours. D-Dimer: No results for input(s): DDIMER in the last 72 hours. Hgb A1c: No results for input(s): HGBA1C in the last 72 hours. Lipid Profile: No results for input(s): CHOL, HDL, LDLCALC, TRIG, CHOLHDL, LDLDIRECT in the last 72 hours. Thyroid function studies: No results for input(s): TSH, T4TOTAL, T3FREE, THYROIDAB in the last 72 hours.  Invalid input(s): FREET3 Anemia work up: No results for input(s): VITAMINB12, FOLATE, FERRITIN, TIBC, IRON, RETICCTPCT in the last 72 hours. Sepsis Labs: Recent Labs  Lab 09/29/17 1251  WBC 3.3*    Microbiology No results found for this or any previous visit (from the past 240 hour(s)).  Procedures and diagnostic studies:  Dg Chest 2 View  Result Date: 09/29/2017 CLINICAL DATA:  Acute chest pain for 2 days. EXAM: CHEST - 2 VIEW COMPARISON:  11/27/2016 and prior radiographs.  11/15/2012 chest CT FINDINGS: The cardiomediastinal silhouette is unchanged with moderate to large hiatal hernia again noted. There is no evidence of focal airspace disease, pulmonary edema, suspicious pulmonary nodule/mass, pleural effusion, or pneumothorax. No acute bony abnormalities are identified. IMPRESSION: No evidence of acute abnormality. Moderate to large hiatal hernia. Electronically Signed   By: Harmon PierJeffrey  Hu M.D.   On: 09/29/2017 13:30    Medications:   . cholecalciferol  2,000 Units Oral Daily  . ferrous sulfate  325 mg Oral Q breakfast  . furosemide  40 mg Oral Daily  . isosorbide mononitrate  30 mg Oral Daily  . multivitamin with minerals  1 tablet Oral Daily   Continuous Infusions:   LOS: 0 days   Joseph ArtJessica U [name]  [facility]   *Please refer to [email or web address], password [email or web address] to get updated schedule on who will round on this patient, as hospitalists switch teams weekly. If 7PM-7AM, please contact night-coverage at [email or web address], password [email or web address] for any overnight  needs.  09/30/2017, 3:39 PM

## 2017-09-30 NOTE — Consult Note (Addendum)
Cardiology Consultation:   Patient ID: [name]; [id number]; 1924-02-08   Admit date: 09/29/2017 Date of Consult: 09/30/2017  Primary Care Provider: Fleet Contras, MD Primary Cardiologist: New   Patient Profile:   ARMANI GAWLIK is a 84 y.o. female with a hx of mild aortic stenosis, CKD Stage III, HTN and hx of GI bleed who is being seen today for the evaluation of chest pain at the request of Dr. Clearnce Sorrel.  History of Present Illness:   Ms. Seeman is a 84 year old female with a history stated above who presented to [facility] on 09/29/2017 with complaints of chest pain. Pt reports that she will occasionally have mid-sternal, sharp chest pain with radiation to her left and right arms over the last week, however on day of admission, this pain was much more intense and was occurring with rest. Pt states that she took 3 SL NTG and went to lay down with pain resolution. Pt reports chronic SOB and dyspnea on exertion for many years. She states that she was hospitalized for GI bleed in 2018 and had a "cardiac procedure" in the past, however there are no records of this. She has not seen a cardiologist since this time and has been managed by her PCP for medications. She denies orthopnea, palpitations, dizziness or syncope, however reports occasional LE swelling. She denies recent illness, including cough, fevers, N/V or diarrhea. She states that she has been compliant with medications and diet. No personal or family hx of CAD. Denis tobacco or alcohol use.   In the ED, EKG revealed sinus bradycardia and no acute ischemic changes noted. Troponin levels have been negative x3 (<0.03, <0.03, <0.03). Creatinine noted to be elevated at 1.86 and WBC at 3.3. CXR showed no evidence of acute cardiopulmonary abnormalities however, it is noted that she has a moderate to large hiatal hernia.  Past Medical History:  Diagnosis Date  . Acid reflux    takes Nexium daily  . Anemia    takes Ferrous Sulfate daily  .  Aortic stenosis    mild AS pk grad 18, mean grad 9, AVA 1.32 cm 03/2015 echo (Dr. Jacinto Halim)  . Arthritis   . Chronic kidney disease (CKD), stage III (moderate) (HCC)   . Diverticulitis   . Hematochezia 03/2015  . High cholesterol    can't take the meds  . History of blood transfusion 03/2015   no abnormal reaction noted  . History of GI diverticular bleed   . HTN (hypertension)    takes Losartan-HCTZ and Metoprolol daily  . Joint pain   . Joint swelling   . Nocturia   . Numbness    in legs  . Peripheral edema    occasionally  . Shortness of breath dyspnea    occasionally and with exertion  . Slow urinary stream    at times    Past Surgical History:  Procedure Laterality Date  . ABCESS DRAINAGE     abdominal abcess  . cataract surgery Bilateral   . CHOLECYSTECTOMY    . DILATION AND CURETTAGE OF UTERUS    . ESOPHAGOGASTRODUODENOSCOPY N/A 02/23/2015   Procedure: ESOPHAGOGASTRODUODENOSCOPY (EGD);  Surgeon: Carman Ching, MD;  Location[facility] ENDOSCOPY;  Service: Endoscopy;  Laterality: N/A;  . FLEXIBLE SIGMOIDOSCOPY N/A 02/24/2015   Procedure: FLEXIBLE SIGMOIDOSCOPY;  Surgeon: Carman Ching, MD;  Location: [facility] ENDOSCOPY;  Service: Endoscopy;  Laterality: N/A;  . IR ANGIOGRAM SELECTIVE EACH ADDITIONAL VESSEL  03/06/2017  . IR ANGIOGRAM SELECTIVE EACH ADDITIONAL VESSEL  03/06/2017  .  IR ANGIOGRAM SELECTIVE EACH ADDITIONAL VESSEL  03/06/2017  . IR ANGIOGRAM SELECTIVE EACH ADDITIONAL VESSEL  03/08/2017  . IR ANGIOGRAM VISCERAL SELECTIVE  03/06/2017  . IR ANGIOGRAM VISCERAL SELECTIVE  03/06/2017  . IR ANGIOGRAM VISCERAL SELECTIVE  03/06/2017  . IR ANGIOGRAM VISCERAL SELECTIVE  03/08/2017  . IR ANGIOGRAM VISCERAL SELECTIVE  03/08/2017  . IR EMBO ART  VEN HEMORR LYMPH EXTRAV  INC GUIDE ROADMAPPING  03/06/2017  . IR EMBO ART  VEN HEMORR LYMPH EXTRAV  INC GUIDE ROADMAPPING  03/08/2017  . IR US GUIDE VASC ACCESS RIGHT  03/06/2017  . IR US GUIDE VASC ACCESS RIGHT  03/08/2017  . left knee surgery    .  TOTAL HIP ARTHROPLASTY Left 05/17/2015   Procedure: LEFT TOTAL HIP ARTHROPLASTY ANTERIOR APPROACH;  Surgeon: Kathryne Hitchhristopher Y Blackman, MD;  Location: [facility] OR;  Service: Orthopedics;  Laterality: Left;     Prior to Admission medications   Medication Sig Start Date End Date Taking? Authorizing Provider  acetaminophen (TYLENOL) 325 MG tablet Take 325 mg by mouth every 6 (six) hours as needed. pain   Yes [provider]  calcium carbonate (TUMS - DOSED IN MG ELEMENTAL CALCIUM) 500 MG chewable tablet Chew 1 tablet by mouth daily as needed for indigestion or heartburn.   Yes [provider]  Cholecalciferol (VITAMIN D3) 2000 units TABS Take 2,000 Units by mouth daily.   Yes [provider]  diclofenac sodium (VOLTAREN) 1 % GEL Apply 2 g topically 2 (two) times daily as needed (pain).    Yes [provider]  ferrous sulfate 325 (65 FE) MG tablet Take 325 mg by mouth daily with breakfast.   Yes [provider]  furosemide (LASIX) 40 MG tablet Take 40 mg by mouth daily. 01/14/17  Yes [provider]  losartan-hydrochlorothiazide (HYZAAR) 100-12.5 MG tablet Take 1 tablet by mouth daily.   Yes [provider]  metoprolol tartrate (LOPRESSOR) 25 MG tablet Take 0.5 tablets (12.5 mg total) by mouth 2 (two) times daily. Patient taking differently: Take 25 mg by mouth 2 (two) times daily.  03/12/17  Yes Tyrone NineGrunz, Ryan B, MD  Multiple Vitamin (MULITIVITAMIN WITH MINERALS) TABS Take 1 tablet by mouth daily.   Yes [provider]  nitroGLYCERIN (NITROSTAT) 0.4 MG SL tablet Place 0.4 mg under the tongue every 5 (five) minutes as needed for chest pain.    Yes [provider]  ondansetron (ZOFRAN ODT) 8 MG disintegrating tablet 8mg  ODT q8 hours prn nausea Patient taking differently: Take 8 mg by mouth every 8 (eight) hours as needed for nausea or vomiting.  08/07/17  Yes Palumbo, April, MD  polyethylene glycol powder (GLYCOLAX/MIRALAX) powder Take 17  g by mouth daily as needed for mild constipation.   Yes [provider]  PROAIR HFA 108 (90 Base) MCG/ACT inhaler Inhale 1-2 puffs into the lungs every 4 (four) hours as needed for shortness of breath or wheezing. 12/04/16  Yes [provider]    Inpatient Medications: Scheduled Meds: . cholecalciferol  2,000 Units Oral Daily  . ferrous sulfate  325 mg Oral Q breakfast  . furosemide  40 mg Oral Daily  . losartan  100 mg Oral Daily   Or  . hydrochlorothiazide  12.5 mg Oral Daily  . isosorbide mononitrate  30 mg Oral Daily  . multivitamin with minerals  1 tablet Oral Daily   Continuous Infusions:  PRN Meds: acetaminophen, calcium carbonate, nitroGLYCERIN, ondansetron (ZOFRAN) IV, polyethylene glycol powder  Allergies:  Allergies  Allergen Reactions  . Ezetimibe Anaphylaxis  . Lipitor [Atorvastatin] Swelling  . Pravachol [Pravastatin] Swelling  . Statins Swelling and Other (See Comments)    Swelling of mouth and lips  . Zocor [Simvastatin] Swelling  . Cholestyramine Nausea And Vomiting    Social History:   Social History   Socioeconomic History  . Marital status: Widowed    Spouse name: Not on file  . Number of children: Not on file  . Years of education: Not on file  . Highest education level: Not on file  Occupational History  . Not on file  Social Needs  . Financial resource strain: Not on file  . Food insecurity:    Worry: Not on file    Inability: Not on file  . Transportation needs:    Medical: Not on file    Non-medical: Not on file  Tobacco Use  . Smoking status: Former Games developermoker  . Smokeless tobacco: Never Used  . Tobacco comment: quit smoking 8+ yrs ago  Substance and Sexual Activity  . Alcohol use: No    Comment: quit yrs ago  . Drug use: No  . Sexual activity: Not Currently  Lifestyle  . Physical activity:    Days per week: Not on file    Minutes per session: Not on file  . Stress: Not on file  Relationships  . Social  connections:    Talks on phone: Not on file    Gets together: Not on file    Attends religious service: Not on file    Active member of club or organization: Not on file    Attends meetings of clubs or organizations: Not on file    Relationship status: Not on file  . Intimate partner violence:    Fear of current or ex partner: Not on file    Emotionally abused: Not on file    Physically abused: Not on file    Forced sexual activity: Not on file  Other Topics Concern  . Not on file  Social History Narrative  . Not on file    Family History:   Family History  Problem Relation Age of Onset  . Diabetes Other   . Hypertension Other   . Cancer Other   . Heart disease Other    Family Status:  Family Status  Relation Name Status  . Other  (Not Specified)    ROS:  Please see the history of present illness.  All other ROS reviewed and negative.     Physical Exam/Data:   Vitals:   09/29/17 1754 09/29/17 1756 09/29/17 2000 09/30/17 0338  BP:  (!) 157/78 (!) 152/81 130/61  Pulse:  65 60 (!) 55  Resp:   19 18  Temp:  98.2 F (36.8 C) 98.2 F (36.8 C) 98.1 F (36.7 C)  TempSrc:  Oral Oral Oral  SpO2:  100% 100% 99%  Weight: 87.4 kg   87 kg  Height: 5\' 3"  (1.6 m)      No intake or output data in the 24 hours ending 09/30/17 0744 Filed Weights   09/29/17 1754 09/30/17 0338  Weight: 87.4 kg 87 kg   Body mass index is 33.96 kg/m.   General: Frail, elderly, NAD Skin: Warm, dry, intact  Head: Normocephalic, atraumatic, clear, moist mucus membranes. Neck: Negative for carotid bruits. No JVD Lungs:Clear to ausculation bilaterally. No wheezes, rales, or rhonchi. Breathing is unlabored. Cardiovascular: RRR with S1 S2. Soft systolic murmur . No  rubs, gallops, or  LV heave appreciated. Abdomen: Soft, non-tender, non-distended with normoactive bowel sounds.  No obvious abdominal masses. MSK: Strength and tone appear normal for age. 5/5 in all extremities Extremities: No edema.  No clubbing or cyanosis. DP/PT pulses 2+ bilaterally Neuro: Alert and oriented. No focal deficits. No facial asymmetry. MAE spontaneously. Psych: Responds to questions appropriately with normal affect.    EKG:  The EKG was personally reviewed and demonstrates: 09/29/17 NSR with no acute ischemic changes  Telemetry:  Telemetry was personally reviewed and demonstrates: 09/30/17 NSR/SB  Relevant CV Studies:  ECHO: see below   CATH: 06/26/2001 RESULTS: Intravascular ultrasound of the distal right coronary artery revealing the presence of mild concentric plaque in the distal vessel which was not obstructed. In the mid to distal vessel, there was mild to moderate degree of eccentric plaque with significant vessel remodeling, but an adequate vessel lumen. There were no obstructive lesions identified.  Laboratory Data:  Chemistry Recent Labs  Lab 09/29/17 1251  NA 141  K 4.1  CL 106  CO2 25  GLUCOSE 105*  BUN 32*  CREATININE 1.86*  CALCIUM 9.5  GFRNONAA 23*  GFRAA 26*  ANIONGAP 10    Total Protein  Date Value Ref Range Status  09/29/2017 6.9 6.5 - 8.1 g/dL Final   Albumin  Date Value Ref Range Status  09/29/2017 3.5 3.5 - 5.0 g/dL Final   AST  Date Value Ref Range Status  09/29/2017 18 15 - 41 U/L Final   ALT  Date Value Ref Range Status  09/29/2017 9 0 - 44 U/L Final   Alkaline Phosphatase  Date Value Ref Range Status  09/29/2017 50 38 - 126 U/L Final   Total Bilirubin  Date Value Ref Range Status  09/29/2017 0.6 0.3 - 1.2 mg/dL Final   Hematology Recent Labs  Lab 09/29/17 1251  WBC 3.3*  RBC 3.52*  HGB 10.9*  HCT 34.5*  MCV 98.0  MCH 31.0  MCHC 31.6  RDW 14.6  PLT 231   Cardiac Enzymes Recent Labs  Lab 09/29/17 1824 09/29/17 2120 09/30/17 0334  TROPONINI <0.03 <0.03 <0.03    Recent Labs  Lab 09/29/17 1249  TROPIPOC 0.01    BNPNo results for input(s): BNP, PROBNP in the last 168 hours.  DDimer No results for input(s): DDIMER in the last  168 hours. TSH:   Lipids: Lab Results  Component Value Date   CHOL (H) 01/06/2009    209        ATP III CLASSIFICATION:  <200     mg/dL   Desirable  161-096  mg/dL   Borderline High  >=045    mg/dL   High          HDL 56 01/06/2009   LDLCALC (H) 01/06/2009    143        Total Cholesterol/HDL:CHD Risk Coronary Heart Disease Risk Table                     Men   Women  1/2 Average Risk   3.4   3.3  Average Risk       5.0   4.4  2 X Average Risk   9.6   7.1  3 X Average Risk  23.4   11.0        Use the calculated Patient Ratio above and the CHD Risk Table to determine the patient's CHD Risk.        ATP III CLASSIFICATION (LDL):  <100  mg/dL   Optimal  409-811  mg/dL   Near or Above                    Optimal  130-159  mg/dL   Borderline  914-782  mg/dL   High  >956     mg/dL   Very High   TRIG 51 21/30/8657   CHOLHDL 3.7 01/06/2009   HgbA1c: Lab Results  Component Value Date   HGBA1C (H) 01/05/2009    6.2 (NOTE) The ADA recommends the following therapeutic goal for glycemic control related to Hgb A1c measurement: Goal of therapy: <6.5 Hgb A1c  Reference: American Diabetes Association: Clinical Practice Recommendations 2010, Diabetes Care, 2010, 33: (Suppl  1).   Radiology/Studies:  Dg Chest 2 View  Result Date: 09/29/2017 CLINICAL DATA:  Acute chest pain for 2 days. EXAM: CHEST - 2 VIEW COMPARISON:  11/27/2016 and prior radiographs.  11/15/2012 chest CT FINDINGS: The cardiomediastinal silhouette is unchanged with moderate to large hiatal hernia again noted. There is no evidence of focal airspace disease, pulmonary edema, suspicious pulmonary nodule/mass, pleural effusion, or pneumothorax. No acute bony abnormalities are identified. IMPRESSION: No evidence of acute abnormality. Moderate to large hiatal hernia. Electronically Signed   By: Harmon Pier M.D.   On: 09/29/2017 13:30   Assessment and Plan:   1.  Chest pain with negative troponin: -Patient [facility]  on 09/29/2017 with complaints of chest pain with radiation to left and right arms. -Troponin, <0.03 x3  -EKG, unremarkable SB no acute ischemic abnormalities>.actually improved from prior tracing -CXR with no acute cardiopulmonary disease however, is noted to have a moderate to large hiatal hernia -Echocardiogram with pending results -Lasix 40mg  PO daily, losartan 100mg  daily, HCTZ 12.5mg  daily, Imdur 30mg  daily  -Would lean toward medical therapy given pt age and comorbid conditions   2.  Moderate to large hiatal hernia: -Noted per CXR  3.  Hypertension: -Stable, 130/61>152/81>157/78 -Metoprolol tartrate on hold secondary to bradycardia  -Consider changing losartan-HCTZ 100-12.5 to amlodipine for BP control given renal insufficiency and bradycardia   4. CKD stage III: -Creatinine, 1.86 today, elevated from baseline at 1.0-1.3 -Consider holding ARB/HCTZ and starting amlodipine given the above  -Avoid nephrotoxic medications    For questions or updates, please contact [facility] Please consult [email or web address] for contact info under Cardiology/STEMI.   Raliegh Ip NP-C [facility] Pager: [phone] 09/30/2017 7:44 AM  Attending Note:   The patient was seen and examined.  Agree with assessment and plan as noted above.  Changes made to the above note as needed.  Patient seen and independently examined with Threasa Heads, NP .   We discussed all aspects of the encounter. I agree with the assessment and plan as stated above.  1.  Chest discomfort: The patient presents with some episodes of chest discomfort.  These episodes are somewhat concerning and that there describes the chest tightness and last for several minutes.  She does get relief with nitroglycerin.  Troponin levels are negative x3. Echo shows normal LV systolic function with grade 2 diastolic dysfunction.    She is an extremely poor candidate for any invasive cardiac procedures ( age,  Acute on chronic  kidney disease )  Would favor medical therapy. Continue consider long-acting nitrates and calcium channel blockers. She has a hx of hiatal hernia and this pain could be related to the hiatal hernia   2.   Acute on chronic renal insufficiency: The patient's creatinine is 1.86.  Further  work-up per Dr. Benjamine Mola.    3.   Aortic stenosis:   Mild   4.  Essential HTN:   Would hold Losartan and HCTZ for now ( with ? Acute rise in creatinine) Could try amlodipine ,  Imdur  Follow renal function   I have spent a total of 40 minutes with patient reviewing hospital  notes , telemetry, EKGs, labs and examining patient as well as establishing an assessment and plan that was discussed with the patient. > 50% of time was spent in direct patient care.    Vesta Mixer, Montez Hageman., MD, [facility] 09/30/2017, 9:55 AM [address],  [address] Office [phone] Pager [phone]

## 2017-09-30 NOTE — Care Management Obs Status (Signed)
MEDICARE OBSERVATION STATUS NOTIFICATION   Patient Details  Name: Brenda GladeBertha C Dinovo MRN: [id number] Date of Birth: 08/20/1923   Medicare Observation Status Notification Given:  Yes    Lollie Marrowatalie Y Ashelyn Mccravy, RN 09/30/2017, 3:32 PM

## 2017-10-01 DIAGNOSIS — I10 Essential (primary) hypertension: Secondary | ICD-10-CM | POA: Diagnosis not present

## 2017-10-01 DIAGNOSIS — I350 Nonrheumatic aortic (valve) stenosis: Secondary | ICD-10-CM | POA: Diagnosis not present

## 2017-10-01 DIAGNOSIS — R0789 Other chest pain: Secondary | ICD-10-CM | POA: Diagnosis not present

## 2017-10-01 DIAGNOSIS — N179 Acute kidney failure, unspecified: Secondary | ICD-10-CM | POA: Diagnosis not present

## 2017-10-01 DIAGNOSIS — R079 Chest pain, unspecified: Secondary | ICD-10-CM | POA: Diagnosis not present

## 2017-10-01 LAB — CBC
HCT: 31.7 % — ABNORMAL LOW (ref 36.0–46.0)
Hemoglobin: 10.3 g/dL — ABNORMAL LOW (ref 12.0–15.0)
MCH: 30.9 pg (ref 26.0–34.0)
MCHC: 32.5 g/dL (ref 30.0–36.0)
MCV: 95.2 fL (ref 78.0–100.0)
Platelets: 217 10*3/uL (ref 150–400)
RBC: 3.33 MIL/uL — ABNORMAL LOW (ref 3.87–5.11)
RDW: 14.7 % (ref 11.5–15.5)
WBC: 3.7 10*3/uL — ABNORMAL LOW (ref 4.0–10.5)

## 2017-10-01 LAB — BASIC METABOLIC PANEL
Anion gap: 5 (ref 5–15)
BUN: 33 mg/dL — ABNORMAL HIGH (ref 8–23)
CO2: 27 mmol/L (ref 22–32)
Calcium: 9.1 mg/dL (ref 8.9–10.3)
Chloride: 108 mmol/L (ref 98–111)
Creatinine, Ser: 1.75 mg/dL — ABNORMAL HIGH (ref 0.44–1.00)
GFR calc Af Amer: 28 mL/min — ABNORMAL LOW (ref >60)
GFR calc non Af Amer: 24 mL/min — ABNORMAL LOW (ref >60)
Glucose, Bld: 101 mg/dL — ABNORMAL HIGH (ref 70–99)
Potassium: 4.1 mmol/L (ref 3.5–5.1)
Sodium: 140 mmol/L (ref 135–145)

## 2017-10-01 MED ORDER — ISOSORBIDE MONONITRATE ER 30 MG PO TB24: 30.0000 mg | ORAL_TABLET | Freq: Every day | ORAL | 0 refills | Status: DC

## 2017-10-01 MED ORDER — DICLOFENAC SODIUM 1 % TD GEL
2.0000 g | Freq: Four times a day (QID) | TRANSDERMAL | Status: DC
  Filled 2017-10-01: qty 100

## 2017-10-01 NOTE — Discharge Summary (Signed)
Physician Discharge Summary  [name] ZOX:[id number] DOB: 1923-10-22 DOA: 09/29/2017  PCP: Fleet Contras, MD  Admit date: 09/29/2017 Discharge date: 10/01/2017  Admitted From: home Discharge disposition: home   Recommendations for Outpatient Follow-Up:   1. Held losartan/HCTZ 2. BMp 1 week   Discharge Diagnosis:   Active Problems:   Status post total replacement of left hip   Hyperlipidemia   Aortic stenosis   Chest pain, rule out acute myocardial infarction   HTN (hypertension)   Acute kidney injury (HCC)   Chest pain    Discharge Condition: Improved.  Diet recommendation: Low sodium, heart healthy  Wound care: None.  Code status: Full.   History of Present Illness:   [name] is a 84 y.o. female with medical history significant of mild aortic stenosis, stage III CKD, hypertension, history of GI bleed, who comes in with chest pain.  Patient reports that she normally has sharp pinging pain over the central part of her chest that radiates to her left arm and sometimes her right arm.  She occasionally takes sublingual nitroglycerin for this.  Today the pain became much more significant and it was at rest.  Patient took 3 sublingual nitro and lay down with resolution of the pain.  Patient is somewhat mobile and does have some chest pain when she walks but it also occurs when she is not walking.  She does have chronic shortness of breath and dyspnea on exertion.  She does not have any orthopnea or.  She does report some lower extremity edema however it also waxes and wanes.  She has not had any fevers, cough, congestion, rhinorrhea, nausea, vomiting, diarrhea, abdominal pain.   Hospital Course by Problem:   Chest pain:  -somewhat atypical -CE negative -suspect that the chest pain is coming from her hiatal hernia. per cards: Given her age and renal insufficiency, she is not a good candidate for heart catheterization so I do not think that she needs any  stress testing. We started her on isosorbide 30 mg a day.  We can increase the dose to 60 mg as needed.  To follow-up with her primary medical doctor.  She does not necessarily need cardiology follow-up.  Hypertension: The losartan HCTZ has been held because of her renal insufficiency.  Her blood pressure is fairly well controlled on the current dose of isosorbide.   Bradycardia: Metoprolol has been held.  Aortic stenosis: Very mild.  No further work-up needed.     Medical Consultants:   cards   Discharge Exam:   Vitals:   09/30/17 2114 10/01/17 0505  BP: (!) 100/57 106/66  Pulse: (!) 55 60  Resp: (!) 24 18  Temp: 97.8 F (36.6 C) 98.3 F (36.8 C)  SpO2: 99% 99%   Vitals:   09/30/17 1341 09/30/17 1454 09/30/17 2114 10/01/17 0505  BP: 104/60 (!) 91/56 (!) 100/57 106/66  Pulse: 64 64 (!) 55 60  Resp:   (!) 24 18  Temp: (!) 97.5 F (36.4 C) 98.7 F (37.1 C) 97.8 F (36.6 C) 98.3 F (36.8 C)  TempSrc: Oral Oral Oral Oral  SpO2: 100% 100% 99% 99%  Weight:    87 kg  Height:        General exam: Appears calm and comfortable.    The results of significant diagnostics from this hospitalization (including imaging, microbiology, ancillary and laboratory) are listed below for reference.     Procedures and Diagnostic Studies:   Dg Chest 2  View  Result Date: 09/29/2017 CLINICAL DATA:  Acute chest pain for 2 days. EXAM: CHEST - 2 VIEW COMPARISON:  11/27/2016 and prior radiographs.  11/15/2012 chest CT FINDINGS: The cardiomediastinal silhouette is unchanged with moderate to large hiatal hernia again noted. There is no evidence of focal airspace disease, pulmonary edema, suspicious pulmonary nodule/mass, pleural effusion, or pneumothorax. No acute bony abnormalities are identified. IMPRESSION: No evidence of acute abnormality. Moderate to large hiatal hernia. Electronically Signed   By: Harmon PierJeffrey  Hu M.D.   On: 09/29/2017 13:30     Labs:   Basic Metabolic Panel: Recent  Labs  Lab 09/29/17 1251 10/01/17 0510  NA 141 140  K 4.1 4.1  CL 106 108  CO2 25 27  GLUCOSE 105* 101*  BUN 32* 33*  CREATININE 1.86* 1.75*  CALCIUM 9.5 9.1   GFR Estimated Creatinine Clearance: 21.9 mL/min (A) (by C-G formula based on SCr of 1.75 mg/dL (H)). Liver Function Tests: Recent Labs  Lab 09/29/17 1307  AST 18  ALT 9  ALKPHOS 50  BILITOT 0.6  PROT 6.9  ALBUMIN 3.5   No results for input(s): LIPASE, AMYLASE in the last 168 hours. No results for input(s): AMMONIA in the last 168 hours. Coagulation profile No results for input(s): INR, PROTIME in the last 168 hours.  CBC: Recent Labs  Lab 09/29/17 1251 10/01/17 0510  WBC 3.3* 3.7*  NEUTROABS 1.9  --   HGB 10.9* 10.3*  HCT 34.5* 31.7*  MCV 98.0 95.2  PLT 231 217   Cardiac Enzymes: Recent Labs  Lab 09/29/17 1824 09/29/17 2120 09/30/17 0334  TROPONINI <0.03 <0.03 <0.03   BNP: Invalid input(s): POCBNP CBG: No results for input(s): GLUCAP in the last 168 hours. D-Dimer No results for input(s): DDIMER in the last 72 hours. Hgb A1c No results for input(s): HGBA1C in the last 72 hours. Lipid Profile No results for input(s): CHOL, HDL, LDLCALC, TRIG, CHOLHDL, LDLDIRECT in the last 72 hours. Thyroid function studies No results for input(s): TSH, T4TOTAL, T3FREE, THYROIDAB in the last 72 hours.  Invalid input(s): FREET3 Anemia work up No results for input(s): VITAMINB12, FOLATE, FERRITIN, TIBC, IRON, RETICCTPCT in the last 72 hours. Microbiology No results found for this or any previous visit (from the past 240 hour(s)).   Discharge Instructions:   Discharge Instructions    Diet - low sodium heart healthy   Complete by:  As directed    Discharge instructions   Complete by:  As directed    Follow up with PCP 1 week for labs and he may need to resume some of your old BP Medications...   Increase activity slowly   Complete by:  As directed      Allergies as of 10/01/2017      Reactions    Ezetimibe Anaphylaxis   Lipitor [atorvastatin] Swelling   Pravachol [pravastatin] Swelling   Statins Swelling, Other (See Comments)   Swelling of mouth and lips   Zocor [simvastatin] Swelling   Cholestyramine Nausea And Vomiting      Medication List    STOP taking these medications   furosemide 40 MG tablet Commonly known as:  LASIX   losartan-hydrochlorothiazide 100-12.5 MG tablet Commonly known as:  HYZAAR   metoprolol tartrate 25 MG tablet Commonly known as:  LOPRESSOR     TAKE these medications   acetaminophen 325 MG tablet Commonly known as:  TYLENOL Take 325 mg by mouth every 6 (six) hours as needed. pain   calcium carbonate 500 MG chewable  tablet Commonly known as:  TUMS - dosed in mg elemental calcium Chew 1 tablet by mouth daily as needed for indigestion or heartburn.   diclofenac sodium 1 % Gel Commonly known as:  VOLTAREN Apply 2 g topically 2 (two) times daily as needed (pain).   ferrous sulfate 325 (65 FE) MG tablet Take 325 mg by mouth daily with breakfast.   isosorbide mononitrate 30 MG 24 hr tablet Commonly known as:  IMDUR Take 1 tablet (30 mg total) by mouth daily. Start taking on:  10/02/2017   multivitamin with minerals Tabs tablet Take 1 tablet by mouth daily.   nitroGLYCERIN 0.4 MG SL tablet Commonly known as:  NITROSTAT Place 0.4 mg under the tongue every 5 (five) minutes as needed for chest pain.   ondansetron 8 MG disintegrating tablet Commonly known as:  ZOFRAN-ODT 8mg  ODT q8 hours prn nausea What changed:    how much to take  how to take this  when to take this  reasons to take this  additional instructions   polyethylene glycol powder powder Commonly known as:  GLYCOLAX/MIRALAX Take 17 g by mouth daily as needed for mild constipation.   PROAIR HFA 108 (90 Base) MCG/ACT inhaler Generic drug:  albuterol Inhale 1-2 puffs into the lungs every 4 (four) hours as needed for shortness of breath or wheezing.   Vitamin D3 2000  units Tabs Take 2,000 Units by mouth daily.         Time coordinating discharge: 25 min  Signed:  Joseph ArtJessica U Chinenye Katzenberger  [facility] 10/01/2017, 9:30 AM
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
## 2017-10-23 ENCOUNTER — Ambulatory Visit (INDEPENDENT_AMBULATORY_CARE_PROVIDER_SITE_OTHER): Payer: 59 | Admitting: Cardiology

## 2017-10-23 ENCOUNTER — Encounter: Payer: Self-pay | Admitting: Cardiology

## 2017-10-23 VITALS — BP 142/62 | HR 58 | Ht 63.0 in | Wt 196.0 lb

## 2017-10-23 DIAGNOSIS — I10 Essential (primary) hypertension: Secondary | ICD-10-CM | POA: Diagnosis not present

## 2017-10-23 DIAGNOSIS — R079 Chest pain, unspecified: Secondary | ICD-10-CM

## 2017-10-23 DIAGNOSIS — I350 Nonrheumatic aortic (valve) stenosis: Secondary | ICD-10-CM | POA: Diagnosis not present

## 2017-10-23 DIAGNOSIS — R001 Bradycardia, unspecified: Secondary | ICD-10-CM

## 2017-10-23 HISTORY — DX: Bradycardia, unspecified: R00.1

## 2017-10-23 NOTE — Patient Instructions (Addendum)
Medication Instructions: Your physician recommends that you continue on your current medications as directed. Please refer to the Current Medication list given to you today.   Labwork: None  Procedures/Testing: None  Follow-Up: Your physician recommends that you schedule a follow-up appointment in: 3 months with Dr. Elease Hashimoto or Lizabeth Leyden NP   Any Additional Special Instructions Will Be Listed Below (If Applicable).   DASH Eating Plan DASH stands for "Dietary Approaches to Stop Hypertension." The DASH eating plan is a healthy eating plan that has been shown to reduce high blood pressure (hypertension). It may also reduce your risk for type 2 diabetes, heart disease, and stroke. The DASH eating plan may also help with weight loss. What are tips for following this plan? General guidelines  Avoid eating more than 2,300 mg (milligrams) of salt (sodium) a day. If you have hypertension, you may need to reduce your sodium intake to 1,500 mg a day.  Limit alcohol intake to no more than 1 drink a day for nonpregnant women and 2 drinks a day for men. One drink equals 12 oz of beer, 5 oz of wine, or 1 oz of hard liquor.  Work with your health care provider to maintain a healthy body weight or to lose weight. Ask what an ideal weight is for you.  Get at least 30 minutes of exercise that causes your heart to beat faster (aerobic exercise) most days of the week. Activities may include walking, swimming, or biking.  Work with your health care provider or diet and nutrition specialist (dietitian) to adjust your eating plan to your individual calorie needs. Reading food labels  Check food labels for the amount of sodium per serving. Choose foods with less than 5 percent of the Daily Value of sodium. Generally, foods with less than 300 mg of sodium per serving fit into this eating plan.  To find whole grains, look for the word "whole" as the first word in the ingredient list. Shopping  Buy  products labeled as "low-sodium" or "no salt added."  Buy fresh foods. Avoid canned foods and premade or frozen meals. Cooking  Avoid adding salt when cooking. Use salt-free seasonings or herbs instead of table salt or sea salt. Check with your health care provider or pharmacist before using salt substitutes.  Do not fry foods. Cook foods using healthy methods such as baking, boiling, grilling, and broiling instead.  Cook with heart-healthy oils, such as olive, canola, soybean, or sunflower oil. Meal planning   Eat a balanced diet that includes: ? 5 or more servings of fruits and vegetables each day. At each meal, try to fill half of your plate with fruits and vegetables. ? Up to 6-8 servings of whole grains each day. ? Less than 6 oz of lean meat, poultry, or fish each day. A 3-oz serving of meat is about the same size as a deck of cards. One egg equals 1 oz. ? 2 servings of low-fat dairy each day. ? A serving of nuts, seeds, or beans 5 times each week. ? Heart-healthy fats. Healthy fats called Omega-3 fatty acids are found in foods such as flaxseeds and coldwater fish, like sardines, salmon, and mackerel.  Limit how much you eat of the following: ? Canned or prepackaged foods. ? Food that is high in trans fat, such as fried foods. ? Food that is high in saturated fat, such as fatty meat. ? Sweets, desserts, sugary drinks, and other foods with added sugar. ? Full-fat dairy products.  Do  not salt foods before eating.  Try to eat at least 2 vegetarian meals each week.  Eat more home-cooked food and less restaurant, buffet, and fast food.  When eating at a restaurant, ask that your food be prepared with less salt or no salt, if possible. What foods are recommended? The items listed may not be a complete list. Talk with your dietitian about what dietary choices are best for you. Grains Whole-grain or whole-wheat bread. Whole-grain or whole-wheat pasta. Brown rice. [place].  Bulgur. Whole-grain and low-sodium cereals. Pita bread. Low-fat, low-sodium crackers. Whole-wheat flour tortillas. Vegetables Fresh or frozen vegetables (raw, steamed, roasted, or grilled). Low-sodium or reduced-sodium tomato and vegetable juice. Low-sodium or reduced-sodium tomato sauce and tomato paste. Low-sodium or reduced-sodium canned vegetables. Fruits All fresh, dried, or frozen fruit. Canned fruit in natural juice (without added sugar). Meat and other protein foods Skinless chicken or [place]. Ground chicken or [place]. Pork with fat trimmed off. Fish and seafood. Egg whites. Dried beans, peas, or lentils. Unsalted nuts, nut butters, and seeds. Unsalted canned beans. Lean cuts of beef with fat trimmed off. Low-sodium, lean deli meat. Dairy Low-fat (1%) or fat-free (skim) milk. Fat-free, low-fat, or reduced-fat cheeses. Nonfat, low-sodium ricotta or cottage cheese. Low-fat or nonfat yogurt. Low-fat, low-sodium cheese. Fats and oils Soft margarine without trans fats. Vegetable oil. Low-fat, reduced-fat, or light mayonnaise and salad dressings (reduced-sodium). Canola, safflower, olive, soybean, and sunflower oils. Avocado. Seasoning and other foods Herbs. Spices. Seasoning mixes without salt. Unsalted popcorn and pretzels. Fat-free sweets. What foods are not recommended? The items listed may not be a complete list. Talk with your dietitian about what dietary choices are best for you. Grains Baked goods made with fat, such as croissants, muffins, or some breads. Dry pasta or rice meal packs. Vegetables Creamed or fried vegetables. Vegetables in a cheese sauce. Regular canned vegetables (not low-sodium or reduced-sodium). Regular canned tomato sauce and paste (not low-sodium or reduced-sodium). Regular tomato and vegetable juice (not low-sodium or reduced-sodium). Angie Fava. Olives. Fruits Canned fruit in a light or heavy syrup. Fried fruit. Fruit in cream or butter sauce. Meat and other  protein foods Fatty cuts of meat. Ribs. Fried meat. Berniece Salines. Sausage. Bologna and other processed lunch meats. Salami. Fatback. Hotdogs. Bratwurst. Salted nuts and seeds. Canned beans with added salt. Canned or smoked fish. Whole eggs or egg yolks. Chicken or [place] with skin. Dairy Whole or 2% milk, cream, and half-and-half. Whole or full-fat cream cheese. Whole-fat or sweetened yogurt. Full-fat cheese. Nondairy creamers. Whipped toppings. Processed cheese and cheese spreads. Fats and oils Butter. Stick margarine. Lard. Shortening. Ghee. Bacon fat. Tropical oils, such as coconut, palm kernel, or palm oil. Seasoning and other foods Salted popcorn and pretzels. Onion salt, garlic salt, seasoned salt, table salt, and sea salt. Worcestershire sauce. Tartar sauce. Barbecue sauce. Teriyaki sauce. Soy sauce, including reduced-sodium. Steak sauce. Canned and packaged gravies. Fish sauce. Oyster sauce. Cocktail sauce. Horseradish that you find on the shelf. Ketchup. Mustard. Meat flavorings and tenderizers. Bouillon cubes. Hot sauce and Tabasco sauce. Premade or packaged marinades. Premade or packaged taco seasonings. Relishes. Regular salad dressings. Where to find more information:  National Heart, Lung, and [place]: https://wilson-eaton.com/  American Heart Association: www.heart.org Summary  The DASH eating plan is a healthy eating plan that has been shown to reduce high blood pressure (hypertension). It may also reduce your risk for type 2 diabetes, heart disease, and stroke.  With the DASH eating plan, you should limit salt (sodium) intake  to 2,300 mg a day. If you have hypertension, you may need to reduce your sodium intake to 1,500 mg a day.  When on the DASH eating plan, aim to eat more fresh fruits and vegetables, whole grains, lean proteins, low-fat dairy, and heart-healthy fats.  Work with your health care provider or diet and nutrition specialist (dietitian) to adjust your eating plan to your  individual calorie needs. This information is not intended to replace advice given to you by your health care provider. Make sure you discuss any questions you have with your health care provider. Document Released: 01/18/2011 Document Revised: 01/23/2016 Document Reviewed: 01/23/2016 Elsevier Interactive Patient Education  [employer].    If you need a refill on your cardiac medications before your next appointment, please call your pharmacy.

## 2017-10-23 NOTE — Progress Notes (Signed)
Cardiology Office Note:    Date:  10/23/2017   ID:  [name], DOB August 06, 1923, MRN [id number]  PCP:  Fleet Contras, MD  Cardiologist:  Kristeen Miss, MD  Referring MD: Fleet Contras, MD   Chief Complaint  Patient presents with  . Hospitalization Follow-up    chest pain    History of Present Illness:    [name] is a 84 y.o. female with a past medical history significant for mild aortic stenosis, CKD stage III, hypertension and history of GI bleed.  He has no significant cardiac history.  Cardiac catheterization in 2003 showed mild concentric plaque in the RCA but no obstructive disease.  He was admitted to the hospital on 09/29/2017 with complaints of chest pain that radiated to both arms.  She did get relief with nitroglycerin.  Troponins were negative x3 and EKG was unremarkable.  She was bradycardic. An echocardiogram showed an EF of 55-60%, grade 2 diastolic dysfunction and no regional wall motion abnormalities. There was mildly to moderately calcified aortic annulus with very mild stenosis, mean gradient 11 mmHg.  Her creatinine was elevated.  She was felt to be a very poor candidate for any invasive cardiac procedures given her age and acute on chronic kidney disease.  Her ARB and hydrochlorothiazide and beta-blocker were held in the setting of bradycardia and renal insufficiency.  Imdur was added.  Blood pressure was well controlled on discharge. At primary care follow up her metoprolol was resumed at 1/2 dose- 12.5 mg bid.    [name] is here today for hospital follow-up and establishment in our office. She has not had any chest pain but did have some mild right lower jaw aching last night that resolved with SL NTG, lasted only a few minutes. She has chronic DOE with walking for many years, not worse lately.  No orthopnea or PND. She has mild chronic ankle edema at baseline. No palpitations except for an occ skip, no lighthededness or syncope.   She lives alone. She  still drives and does her cooking and shopping and all of her housework. She is unable to do yard work anymore so her son does that.   Past Medical History:  Diagnosis Date  . Acid reflux    takes Nexium daily  . Anemia    takes Ferrous Sulfate daily  . Aortic stenosis    mild AS pk grad 18, mean grad 9, AVA 1.32 cm 03/2015 echo (Dr. Jacinto Halim)  . Arthritis   . Chronic kidney disease (CKD), stage III (moderate) (HCC)   . Diverticulitis   . Hematochezia 03/2015  . High cholesterol    can't take the meds  . History of blood transfusion 03/2015   no abnormal reaction noted  . History of GI diverticular bleed   . HTN (hypertension)    takes Losartan-HCTZ and Metoprolol daily  . Joint pain   . Joint swelling   . Nocturia   . Numbness    in legs  . Peripheral edema    occasionally  . Shortness of breath dyspnea    occasionally and with exertion  . Slow urinary stream    at times    Past Surgical History:  Procedure Laterality Date  . ABCESS DRAINAGE     abdominal abcess  . cataract surgery Bilateral   . CHOLECYSTECTOMY    . DILATION AND CURETTAGE OF UTERUS    . ESOPHAGOGASTRODUODENOSCOPY N/A 02/23/2015   Procedure: ESOPHAGOGASTRODUODENOSCOPY (EGD);  Surgeon: Carman Ching, MD;  Location:  [facility] ENDOSCOPY;  Service: Endoscopy;  Laterality: N/A;  . FLEXIBLE SIGMOIDOSCOPY N/A 02/24/2015   Procedure: FLEXIBLE SIGMOIDOSCOPY;  Surgeon: Carman Ching, MD;  Location: [facility] ENDOSCOPY;  Service: Endoscopy;  Laterality: N/A;  . IR ANGIOGRAM SELECTIVE EACH ADDITIONAL VESSEL  03/06/2017  . IR ANGIOGRAM SELECTIVE EACH ADDITIONAL VESSEL  03/06/2017  . IR ANGIOGRAM SELECTIVE EACH ADDITIONAL VESSEL  03/06/2017  . IR ANGIOGRAM SELECTIVE EACH ADDITIONAL VESSEL  03/08/2017  . IR ANGIOGRAM VISCERAL SELECTIVE  03/06/2017  . IR ANGIOGRAM VISCERAL SELECTIVE  03/06/2017  . IR ANGIOGRAM VISCERAL SELECTIVE  03/06/2017  . IR ANGIOGRAM VISCERAL SELECTIVE  03/08/2017  . IR ANGIOGRAM VISCERAL SELECTIVE  03/08/2017  . IR  EMBO ART  VEN HEMORR LYMPH EXTRAV  INC GUIDE ROADMAPPING  03/06/2017  . IR EMBO ART  VEN HEMORR LYMPH EXTRAV  INC GUIDE ROADMAPPING  03/08/2017  . IR US GUIDE VASC ACCESS RIGHT  03/06/2017  . IR US GUIDE VASC ACCESS RIGHT  03/08/2017  . left knee surgery    . TOTAL HIP ARTHROPLASTY Left 05/17/2015   Procedure: LEFT TOTAL HIP ARTHROPLASTY ANTERIOR APPROACH;  Surgeon: Kathryne Hitch, MD;  Location: [facility] OR;  Service: Orthopedics;  Laterality: Left;    Current Medications: Current Meds  Medication Sig  . acetaminophen (TYLENOL) 325 MG tablet Take 325 mg by mouth every 6 (six) hours as needed. pain  . calcium carbonate (TUMS - DOSED IN MG ELEMENTAL CALCIUM) 500 MG chewable tablet Chew 1 tablet by mouth daily as needed for indigestion or heartburn.  . Cholecalciferol (VITAMIN D3) 2000 units TABS Take 2,000 Units by mouth daily.  . diclofenac sodium (VOLTAREN) 1 % GEL Apply 2 g topically 2 (two) times daily as needed (pain).   . ferrous sulfate 325 (65 FE) MG tablet Take 325 mg by mouth daily with breakfast.  . isosorbide mononitrate (IMDUR) 30 MG 24 hr tablet Take 1 tablet (30 mg total) by mouth daily.  . metoprolol tartrate (LOPRESSOR) 25 MG tablet Take 12.5 mg by mouth 2 (two) times daily.   . Multiple Vitamin (MULITIVITAMIN WITH MINERALS) TABS Take 1 tablet by mouth daily.  . nitroGLYCERIN (NITROSTAT) 0.4 MG SL tablet Place 0.4 mg under the tongue every 5 (five) minutes as needed for chest pain.   Marland Kitchen ondansetron (ZOFRAN ODT) 8 MG disintegrating tablet 8mg  ODT q8 hours prn nausea (Patient taking differently: Take 8 mg by mouth every 8 (eight) hours as needed for nausea or vomiting. )  . polyethylene glycol powder (GLYCOLAX/MIRALAX) powder Take 17 g by mouth daily as needed for mild constipation.  Marland Kitchen PROAIR HFA 108 (90 Base) MCG/ACT inhaler Inhale 1-2 puffs into the lungs every 4 (four) hours as needed for shortness of breath or wheezing.     Allergies:   Ezetimibe; Lipitor [atorvastatin];  Pravachol [pravastatin]; Statins; Zocor [simvastatin]; and Cholestyramine   Social History   Socioeconomic History  . Marital status: Widowed    Spouse name: Not on file  . Number of children: Not on file  . Years of education: Not on file  . Highest education level: Not on file  Occupational History  . Not on file  Social Needs  . Financial resource strain: Not very hard  . Food insecurity:    Worry: Patient refused    Inability: Patient refused  . Transportation needs:    Medical: Patient refused    Non-medical: Patient refused  Tobacco Use  . Smoking status: Former Games developer  . Smokeless tobacco: Never Used  . Tobacco comment:  quit smoking 8+ yrs ago  Substance and Sexual Activity  . Alcohol use: No    Comment: quit yrs ago  . Drug use: No  . Sexual activity: Not Currently  Lifestyle  . Physical activity:    Days per week: 1 day    Minutes per session: 10 min  . Stress: Only a little  Relationships  . Social connections:    Talks on phone: More than three times a week    Gets together: Three times a week    Attends religious service: 1 to 4 times per year    Active member of club or organization: No    Attends meetings of clubs or organizations: Never    Relationship status: Widowed  Other Topics Concern  . Not on file  Social History Narrative  . Not on file     Family History: The patient's family history includes Cancer in her other; Diabetes in her other; Heart disease in her other; Hypertension in her other. ROS:   Please see the history of present illness.     All other systems reviewed and are negative.  EKGs/Labs/Other Studies Reviewed:    The following studies were reviewed today:  Echocardiogram 09/30/2017 Study Conclusions - Left ventricle: The cavity size was normal. Systolic function was   normal. The estimated ejection fraction was in the range of 55%   to 60%. Wall motion was normal; there were no regional wall   motion abnormalities.  Features are consistent with a pseudonormal   left ventricular filling pattern, with concomitant abnormal   relaxation and increased filling pressure (grade 2 diastolic   dysfunction). Doppler parameters are consistent with high   ventricular filling pressure. - Aortic valve: Mildly to moderately calcified annulus. Trileaflet;   mildly thickened, mildly calcified leaflets. There was very mild   stenosis. Mean gradient (S): 11 mm Hg. Valve area (VTI): 1.3   cm^2. Valve area (Vmax): 1.31 cm^2. Valve area (Vmean): 1.45   cm^2. - Mitral valve: Calcified annulus. - Left atrium: The atrium was moderately dilated. Volume/bsa, ES,   (1-plane Simpson&'s, A2C): 55.9 ml/m^2. - Right atrium: The atrium was mildly dilated. - Tricuspid valve: There was mild regurgitation. - Pulmonary arteries: PA peak pressure: 33 mm Hg (S).  CATH: 06/26/2001 RESULTS: Intravascular ultrasound of the distal right coronary artery revealing the presence of mild concentric plaque in the distal vessel which was not obstructed. In the mid to distal vessel, there was mild to moderate degree of eccentric plaque with significant vessel remodeling, but an adequate vessel lumen. There were no obstructive lesions identified.   EKG:  EKG is not ordered today.    Recent Labs: 09/29/2017: ALT 9 10/01/2017: BUN 33; Creatinine, Ser 1.75; Hemoglobin 10.3; Platelets 217; Potassium 4.1; Sodium 140   Recent Lipid Panel    Component Value Date/Time   CHOL (H) 01/06/2009 0149    209        ATP III CLASSIFICATION:  <200     mg/dL   Desirable  161-096  mg/dL   Borderline High  >=045    mg/dL   High          TRIG 51 01/06/2009 0149   HDL 56 01/06/2009 0149   CHOLHDL 3.7 01/06/2009 0149   VLDL 10 01/06/2009 0149   LDLCALC (H) 01/06/2009 0149    143        Total Cholesterol/HDL:CHD Risk Coronary Heart Disease Risk Table  Men   Women  1/2 Average Risk   3.4   3.3  Average Risk       5.0   4.4  2 X Average  Risk   9.6   7.1  3 X Average Risk  23.4   11.0        Use the calculated Patient Ratio above and the CHD Risk Table to determine the patient's CHD Risk.        ATP III CLASSIFICATION (LDL):  <100     mg/dL   Optimal  409-811  mg/dL   Near or Above                    Optimal  130-159  mg/dL   Borderline  914-782  mg/dL   High  >956     mg/dL   Very High    Physical Exam:    VS:  BP (!) 142/62   Pulse (!) 58   Ht 5\' 3"  (1.6 m)   Wt 196 lb (88.9 kg)   SpO2 98%   BMI 34.72 kg/m     Wt Readings from Last 3 Encounters:  10/23/17 196 lb (88.9 kg)  10/01/17 191 lb 11.2 oz (87 kg)  03/12/17 213 lb 10 oz (96.9 kg)     Physical Exam  Constitutional: She is oriented to person, place, and time. She appears well-developed and well-nourished. No distress.  Spry elderly female  HENT:  Head: Normocephalic and atraumatic.  Neck: Normal range of motion. Neck supple. No JVD present.  Cardiovascular: Normal rate, regular rhythm, normal heart sounds and intact distal pulses. Exam reveals no gallop and no friction rub.  No murmur heard. Pulmonary/Chest: Effort normal and breath sounds normal. No respiratory distress. She has no wheezes. She has no rales.  Abdominal: Soft. Bowel sounds are normal.  Musculoskeletal: Normal range of motion. She exhibits edema.  Trace ankle edema  Neurological: She is alert and oriented to person, place, and time.  Skin: Skin is warm and dry.  Psychiatric: She has a normal mood and affect. Her behavior is normal. Judgment and thought content normal.  Vitals reviewed.    ASSESSMENT:    1. Chest pain, unspecified type   2. Essential hypertension   3. Bradycardia   4. Nonrheumatic aortic valve stenosis    PLAN:    In order of problems listed above:  Chest pain: Somewhat atypical.  Ruled out for MI at the hospital.  Possibly related to hiatal hernia.  Given her age and renal insufficiency she was felt not to be a good candidate for cardiac  catheterization or stress test.  Imdur 30 mg was started. No chest pain since then. She did have brief jaw discomfort last night but she says overall she is much better. She does not want to increase Imdur at this time.   Hypertension: Her losartan and hydrochlorothiazide have been put on hold due to renal insufficiency.  Imdur 30 mg was added and is providing adequate blood pressure control.  Bradycardia: Metoprolol was placed on hold. Her PCP has resumed low dose metoprolol at 12.5 mg bid. HR 58 and she seems to be tolerating it well.   Aortic stenosis: Very mild.  No further work-up needed   Medication Adjustments/Labs and Tests Ordered: Current medicines are reviewed at length with the patient today.  Concerns regarding medicines are outlined above. Labs and tests ordered and medication changes are outlined in the patient instructions below:  Patient Instructions  Medication  Instructions: Your physician recommends that you continue on your current medications as directed. Please refer to the Current Medication list given to you today.   Labwork: None  Procedures/Testing: None  Follow-Up: Your physician recommends that you schedule a follow-up appointment in: 3 months with Dr. Elease Hashimoto or Lizabeth Leyden NP   Any Additional Special Instructions Will Be Listed Below (If Applicable).   DASH Eating Plan DASH stands for "Dietary Approaches to Stop Hypertension." The DASH eating plan is a healthy eating plan that has been shown to reduce high blood pressure (hypertension). It may also reduce your risk for type 2 diabetes, heart disease, and stroke. The DASH eating plan may also help with weight loss. What are tips for following this plan? General guidelines  Avoid eating more than 2,300 mg (milligrams) of salt (sodium) a day. If you have hypertension, you may need to reduce your sodium intake to 1,500 mg a day.  Limit alcohol intake to no more than 1 drink a day for nonpregnant women and  2 drinks a day for men. One drink equals 12 oz of beer, 5 oz of wine, or 1 oz of hard liquor.  Work with your health care provider to maintain a healthy body weight or to lose weight. Ask what an ideal weight is for you.  Get at least 30 minutes of exercise that causes your heart to beat faster (aerobic exercise) most days of the week. Activities may include walking, swimming, or biking.  Work with your health care provider or diet and nutrition specialist (dietitian) to adjust your eating plan to your individual calorie needs. Reading food labels  Check food labels for the amount of sodium per serving. Choose foods with less than 5 percent of the Daily Value of sodium. Generally, foods with less than 300 mg of sodium per serving fit into this eating plan.  To find whole grains, look for the word "whole" as the first word in the ingredient list. Shopping  Buy products labeled as "low-sodium" or "no salt added."  Buy fresh foods. Avoid canned foods and premade or frozen meals. Cooking  Avoid adding salt when cooking. Use salt-free seasonings or herbs instead of table salt or sea salt. Check with your health care provider or pharmacist before using salt substitutes.  Do not fry foods. Cook foods using healthy methods such as baking, boiling, grilling, and broiling instead.  Cook with heart-healthy oils, such as olive, canola, soybean, or sunflower oil. Meal planning   Eat a balanced diet that includes: ? 5 or more servings of fruits and vegetables each day. At each meal, try to fill half of your plate with fruits and vegetables. ? Up to 6-8 servings of whole grains each day. ? Less than 6 oz of lean meat, poultry, or fish each day. A 3-oz serving of meat is about the same size as a deck of cards. One egg equals 1 oz. ? 2 servings of low-fat dairy each day. ? A serving of nuts, seeds, or beans 5 times each week. ? Heart-healthy fats. Healthy fats called Omega-3 fatty acids are found in  foods such as flaxseeds and coldwater fish, like sardines, salmon, and mackerel.  Limit how much you eat of the following: ? Canned or prepackaged foods. ? Food that is high in trans fat, such as fried foods. ? Food that is high in saturated fat, such as fatty meat. ? Sweets, desserts, sugary drinks, and other foods with added sugar. ? Full-fat dairy products.  Do  not salt foods before eating.  Try to eat at least 2 vegetarian meals each week.  Eat more home-cooked food and less restaurant, buffet, and fast food.  When eating at a restaurant, ask that your food be prepared with less salt or no salt, if possible. What foods are recommended? The items listed may not be a complete list. Talk with your dietitian about what dietary choices are best for you. Grains Whole-grain or whole-wheat bread. Whole-grain or whole-wheat pasta. Brown rice. Orpah Cobb. Bulgur. Whole-grain and low-sodium cereals. Pita bread. Low-fat, low-sodium crackers. Whole-wheat flour tortillas. Vegetables Fresh or frozen vegetables (raw, steamed, roasted, or grilled). Low-sodium or reduced-sodium tomato and vegetable juice. Low-sodium or reduced-sodium tomato sauce and tomato paste. Low-sodium or reduced-sodium canned vegetables. Fruits All fresh, dried, or frozen fruit. Canned fruit in natural juice (without added sugar). Meat and other protein foods Skinless chicken or [place]. Ground chicken or [place]. Pork with fat trimmed off. Fish and seafood. Egg whites. Dried beans, peas, or lentils. Unsalted nuts, nut butters, and seeds. Unsalted canned beans. Lean cuts of beef with fat trimmed off. Low-sodium, lean deli meat. Dairy Low-fat (1%) or fat-free (skim) milk. Fat-free, low-fat, or reduced-fat cheeses. Nonfat, low-sodium ricotta or cottage cheese. Low-fat or nonfat yogurt. Low-fat, low-sodium cheese. Fats and oils Soft margarine without trans fats. Vegetable oil. Low-fat, reduced-fat, or light mayonnaise and salad  dressings (reduced-sodium). Canola, safflower, olive, soybean, and sunflower oils. Avocado. Seasoning and other foods Herbs. Spices. Seasoning mixes without salt. Unsalted popcorn and pretzels. Fat-free sweets. What foods are not recommended? The items listed may not be a complete list. Talk with your dietitian about what dietary choices are best for you. Grains Baked goods made with fat, such as croissants, muffins, or some breads. Dry pasta or rice meal packs. Vegetables Creamed or fried vegetables. Vegetables in a cheese sauce. Regular canned vegetables (not low-sodium or reduced-sodium). Regular canned tomato sauce and paste (not low-sodium or reduced-sodium). Regular tomato and vegetable juice (not low-sodium or reduced-sodium). Rosita Fire. Olives. Fruits Canned fruit in a light or heavy syrup. Fried fruit. Fruit in cream or butter sauce. Meat and other protein foods Fatty cuts of meat. Ribs. Fried meat. Tomasa Blase. Sausage. Bologna and other processed lunch meats. Salami. Fatback. Hotdogs. Bratwurst. Salted nuts and seeds. Canned beans with added salt. Canned or smoked fish. Whole eggs or egg yolks. Chicken or [place] with skin. Dairy Whole or 2% milk, cream, and half-and-half. Whole or full-fat cream cheese. Whole-fat or sweetened yogurt. Full-fat cheese. Nondairy creamers. Whipped toppings. Processed cheese and cheese spreads. Fats and oils Butter. Stick margarine. Lard. Shortening. Ghee. Bacon fat. Tropical oils, such as coconut, palm kernel, or palm oil. Seasoning and other foods Salted popcorn and pretzels. Onion salt, garlic salt, seasoned salt, table salt, and sea salt. Worcestershire sauce. Tartar sauce. Barbecue sauce. Teriyaki sauce. Soy sauce, including reduced-sodium. Steak sauce. Canned and packaged gravies. Fish sauce. Oyster sauce. Cocktail sauce. Horseradish that you find on the shelf. Ketchup. Mustard. Meat flavorings and tenderizers. Bouillon cubes. Hot sauce and Tabasco sauce.  Premade or packaged marinades. Premade or packaged taco seasonings. Relishes. Regular salad dressings. Where to find more information:  National Heart, Lung, and Blood Institute: PopSteam.is  American Heart Association: www.heart.org Summary  The DASH eating plan is a healthy eating plan that has been shown to reduce high blood pressure (hypertension). It may also reduce your risk for type 2 diabetes, heart disease, and stroke.  With the DASH eating plan, you should limit salt (sodium) intake  to 2,300 mg a day. If you have hypertension, you may need to reduce your sodium intake to 1,500 mg a day.  When on the DASH eating plan, aim to eat more fresh fruits and vegetables, whole grains, lean proteins, low-fat dairy, and heart-healthy fats.  Work with your health care provider or diet and nutrition specialist (dietitian) to adjust your eating plan to your individual calorie needs. This information is not intended to replace advice given to you by your health care provider. Make sure you discuss any questions you have with your health care provider. Document Released: 01/18/2011 Document Revised: 01/23/2016 Document Reviewed: 01/23/2016 Elsevier Interactive Patient Education  [employer].    If you need a refill on your cardiac medications before your next appointment, please call your pharmacy.      Signed, Berton Bon, NP  10/23/2017 6:05 PM    [facility]

## 2018-01-02 ENCOUNTER — Encounter: Payer: Self-pay | Admitting: Cardiovascular Disease

## 2018-01-21 ENCOUNTER — Ambulatory Visit (INDEPENDENT_AMBULATORY_CARE_PROVIDER_SITE_OTHER): Payer: 59 | Admitting: Cardiovascular Disease

## 2018-01-21 ENCOUNTER — Encounter: Payer: Self-pay | Admitting: Cardiovascular Disease

## 2018-01-21 VITALS — BP 148/64 | HR 63 | Ht 63.0 in | Wt 196.0 lb

## 2018-01-21 DIAGNOSIS — I2510 Atherosclerotic heart disease of native coronary artery without angina pectoris: Secondary | ICD-10-CM

## 2018-01-21 DIAGNOSIS — I208 Other forms of angina pectoris: Secondary | ICD-10-CM | POA: Diagnosis not present

## 2018-01-21 DIAGNOSIS — I350 Nonrheumatic aortic (valve) stenosis: Secondary | ICD-10-CM

## 2018-01-21 DIAGNOSIS — I2089 Other forms of angina pectoris: Secondary | ICD-10-CM

## 2018-01-21 HISTORY — DX: Atherosclerotic heart disease of native coronary artery without angina pectoris: I25.10

## 2018-01-21 NOTE — Progress Notes (Signed)
Cardiology Office Note:    Date:  01/21/2018   ID:  [name], DOB [birth year], MRN [id number]  PCP:  Fleet Contras, MD  Cardiologist:  Kristeen Miss, MD  Electrophysiologist:  None   Referring MD: Fleet Contras, MD   Chief Complaint  Patient presents with  . Chest Pain         [name] is a 84 y.o. female with a hx of mild aortic stenosis, chronic kidney disease stage III, hypertension and history of GI bleed.  She has minimal coronary artery disease by heart catheterization in 2003.  She was recently admitted to the hospital this past summer with episodes of chest pain.  She was thought to be a very poor candidate for invasive/interventional procedures.  She was seen for a return office visit by Lizabeth Leyden, nurse practitioner on October 23, 2017.  Seems to be going better.    The chest pain has improved on medical therapy  Has occasional palpitations  Occasional left leg swelling     Past Medical History:  Diagnosis Date  . Acid reflux    takes Nexium daily  . Anemia    takes Ferrous Sulfate daily  . Aortic stenosis    mild AS pk grad 18, mean grad 9, AVA 1.32 cm 03/2015 echo (Dr. Jacinto Halim)  . Arthritis   . Chronic kidney disease (CKD), stage III (moderate) (HCC)   . Diverticulitis   . Hematochezia 03/2015  . High cholesterol    can't take the meds  . History of blood transfusion 03/2015   no abnormal reaction noted  . History of GI diverticular bleed   . HTN (hypertension)    takes Losartan-HCTZ and Metoprolol daily  . Joint pain   . Joint swelling   . Nocturia   . Numbness    in legs  . Peripheral edema    occasionally  . Shortness of breath dyspnea    occasionally and with exertion  . Slow urinary stream    at times    Past Surgical History:  Procedure Laterality Date  . ABCESS DRAINAGE     abdominal abcess  . cataract surgery Bilateral   . CHOLECYSTECTOMY    . DILATION AND CURETTAGE OF UTERUS    . ESOPHAGOGASTRODUODENOSCOPY N/A  02/23/2015   Procedure: ESOPHAGOGASTRODUODENOSCOPY (EGD);  Surgeon: Carman Ching, MD;  Location: [facility] ENDOSCOPY;  Service: Endoscopy;  Laterality: N/A;  . FLEXIBLE SIGMOIDOSCOPY N/A 02/24/2015   Procedure: FLEXIBLE SIGMOIDOSCOPY;  Surgeon: Carman Ching, MD;  Location: [facility] ENDOSCOPY;  Service: Endoscopy;  Laterality: N/A;  . IR ANGIOGRAM SELECTIVE EACH ADDITIONAL VESSEL  03/06/2017  . IR ANGIOGRAM SELECTIVE EACH ADDITIONAL VESSEL  03/06/2017  . IR ANGIOGRAM SELECTIVE EACH ADDITIONAL VESSEL  03/06/2017  . IR ANGIOGRAM SELECTIVE EACH ADDITIONAL VESSEL  03/08/2017  . IR ANGIOGRAM VISCERAL SELECTIVE  03/06/2017  . IR ANGIOGRAM VISCERAL SELECTIVE  03/06/2017  . IR ANGIOGRAM VISCERAL SELECTIVE  03/06/2017  . IR ANGIOGRAM VISCERAL SELECTIVE  03/08/2017  . IR ANGIOGRAM VISCERAL SELECTIVE  03/08/2017  . IR EMBO ART  VEN HEMORR LYMPH EXTRAV  INC GUIDE ROADMAPPING  03/06/2017  . IR EMBO ART  VEN HEMORR LYMPH EXTRAV  INC GUIDE ROADMAPPING  03/08/2017  . IR US GUIDE VASC ACCESS RIGHT  03/06/2017  . IR US GUIDE VASC ACCESS RIGHT  03/08/2017  . left knee surgery    . TOTAL HIP ARTHROPLASTY Left 05/17/2015   Procedure: LEFT TOTAL HIP ARTHROPLASTY ANTERIOR APPROACH;  Surgeon: Kathryne Hitch, MD;  Location: [facility] OR;  Service: Orthopedics;  Laterality: Left;    Current Medications: Current Meds  Medication Sig  . acetaminophen (TYLENOL) 325 MG tablet Take 325 mg by mouth every 6 (six) hours as needed. pain  . calcium carbonate (TUMS - DOSED IN MG ELEMENTAL CALCIUM) 500 MG chewable tablet Chew 1 tablet by mouth daily as needed for indigestion or heartburn.  . Cholecalciferol (VITAMIN D3) 2000 units TABS Take 2,000 Units by mouth daily.  . diclofenac sodium (VOLTAREN) 1 % GEL Apply 2 g topically 2 (two) times daily as needed (pain).   . ferrous sulfate 325 (65 FE) MG tablet Take 325 mg by mouth daily with breakfast.  . isosorbide mononitrate (IMDUR) 30 MG 24 hr tablet Take 1 tablet (30 mg total) by mouth daily.  .  metoprolol tartrate (LOPRESSOR) 25 MG tablet Take 12.5 mg by mouth 2 (two) times daily.   . Multiple Vitamin (MULITIVITAMIN WITH MINERALS) TABS Take 1 tablet by mouth daily.  . nitroGLYCERIN (NITROSTAT) 0.4 MG SL tablet Place 0.4 mg under the tongue every 5 (five) minutes as needed for chest pain.   Marland Kitchen ondansetron (ZOFRAN) 8 MG tablet Take 8 mg by mouth every 8 (eight) hours as needed for nausea or vomiting.  . polyethylene glycol powder (GLYCOLAX/MIRALAX) powder Take 17 g by mouth daily as needed for mild constipation.  Marland Kitchen PROAIR HFA 108 (90 Base) MCG/ACT inhaler Inhale 1-2 puffs into the lungs every 4 (four) hours as needed for shortness of breath or wheezing.     Allergies:   Ezetimibe; Lipitor [atorvastatin]; Pravachol [pravastatin]; Statins; Zocor [simvastatin]; and Cholestyramine   Social History   Socioeconomic History  . Marital status: Widowed    Spouse name: Not on file  . Number of children: Not on file  . Years of education: Not on file  . Highest education level: Not on file  Occupational History  . Not on file  Social Needs  . Financial resource strain: Not very hard  . Food insecurity:    Worry: Patient refused    Inability: Patient refused  . Transportation needs:    Medical: Patient refused    Non-medical: Patient refused  Tobacco Use  . Smoking status: Former Games developer  . Smokeless tobacco: Never Used  . Tobacco comment: quit smoking 8+ yrs ago  Substance and Sexual Activity  . Alcohol use: No    Comment: quit yrs ago  . Drug use: No  . Sexual activity: Not Currently  Lifestyle  . Physical activity:    Days per week: 1 day    Minutes per session: 10 min  . Stress: Only a little  Relationships  . Social connections:    Talks on phone: More than three times a week    Gets together: Three times a week    Attends religious service: 1 to 4 times per year    Active member of club or organization: No    Attends meetings of clubs or organizations: Never     Relationship status: Widowed  Other Topics Concern  . Not on file  Social History Narrative  . Not on file     Family History: The patient's family history includes Cancer in her other; Diabetes in her other; Heart disease in her other; Hypertension in her other.  ROS:   Please see the history of present illness.     All other systems reviewed and are negative.  EKGs/Labs/Other Studies Reviewed:    The following studies were reviewed today:  EKG:    Recent Labs: 09/29/2017: ALT 9 10/01/2017: BUN 33; Creatinine, Ser 1.75; Hemoglobin 10.3; Platelets 217; Potassium 4.1; Sodium 140  Recent Lipid Panel    Component Value Date/Time   CHOL (H) 01/06/2009 0149    209        ATP III CLASSIFICATION:  <200     mg/dL   Desirable  [id number]  mg/dL   Borderline High  >=474>=240    mg/dL   High          TRIG 51 01/06/2009 0149   HDL 56 01/06/2009 0149   CHOLHDL 3.7 01/06/2009 0149   VLDL 10 01/06/2009 0149   LDLCALC (H) 01/06/2009 0149    143        Total Cholesterol/HDL:CHD Risk Coronary Heart Disease Risk Table                     Men   Women  1/2 Average Risk   3.4   3.3  Average Risk       5.0   4.4  2 X Average Risk   9.6   7.1  3 X Average Risk  23.4   11.0        Use the calculated Patient Ratio above and the CHD Risk Table to determine the patient's CHD Risk.        ATP III CLASSIFICATION (LDL):  <100     mg/dL   Optimal  [id number]  mg/dL   Near or Above                    Optimal  130-159  mg/dL   Borderline  [id number]  mg/dL   High  >329>190     mg/dL   Very High    Physical Exam:    VS:  BP (!) 148/64   Pulse 63   Ht 5\' 3"  (1.6 m)   Wt 196 lb (88.9 kg)   SpO2 99%   BMI 34.72 kg/m     Wt Readings from Last 3 Encounters:  01/21/18 196 lb (88.9 kg)  10/23/17 196 lb (88.9 kg)  10/01/17 191 lb 11.2 oz (87 kg)     GEN:   Elderly female, no acute distress HEENT: Normal NECK: No JVD; No carotid bruits LYMPHATICS: No lymphadenopathy CARDIAC:RRR, no murmurs, rubs,  gallops RESPIRATORY:  Clear to auscultation without rales, wheezing or rhonchi  ABDOMEN: Soft, non-tender, non-distended MUSCULOSKELETAL:  No edema; No deformity  SKIN: Warm and dry NEUROLOGIC:  Alert and oriented x 3 PSYCHIATRIC:  Normal affect   ASSESSMENT:    No diagnosis found. PLAN:    In order of problems listed above:  1. Chest pain : Doristine CounterBertha continues to have episodes of chest discomfort.  She is a very poor candidate for invasive procedures so we have elected to proceed with medical therapy.  Overall she seems to be stable.  She is on low-dose isosorbide which seems to fairly well control her chest discomfort. Is not having any episodes of syncope.  Continue with same medications.  She may take the nitroglycerin 0.4 mg as needed.   Medication Adjustments/Labs and Tests Ordered: Current medicines are reviewed at length with the patient today.  Concerns regarding medicines are outlined above.  No orders of the defined types were placed in this encounter.  No orders of the defined types were placed in this encounter.   Patient Instructions  Medication Instructions:  Your physician recommends that you continue on your current medications  as directed. Please refer to the Current Medication list given to you today.  If you need a refill on your cardiac medications before your next appointment, please call your pharmacy.   Lab work: None Ordered  If you have labs (blood work) drawn today and your tests are completely normal, you will receive your results only by: Marland Kitchen MyChart Message (if you have MyChart) OR . A paper copy in the mail If you have any lab test that is abnormal or we need to change your treatment, we will call you to review the results.  Testing/Procedures: None Ordered   Follow-Up: At [facility], you and your health needs are our priority.  As part of our continuing mission to provide you with exceptional heart care, we have created designated  Provider Care Teams.  These Care Teams include your primary Cardiologist (physician) and Advanced Practice Providers (APPs -  Physician Assistants and Nurse Practitioners) who all work together to provide you with the care you need, when you need it. You will need a follow up appointment in:  6 months.  Please call our office 2 months in advance to schedule this appointment.  You may see Kristeen Miss, MD or one of the following Advanced Practice Providers on your designated Care Team: Tereso Newcomer, PA-C Vin Talmage, [place] . Berton Bon, NP      Signed, Kristeen Miss, MD  01/21/2018 2:41 PM    [facility]

## 2018-01-21 NOTE — Patient Instructions (Signed)
Medication Instructions:  Your physician recommends that you continue on your current medications as directed. Please refer to the Current Medication list given to you today.  If you need a refill on your cardiac medications before your next appointment, please call your pharmacy.    Lab work: None Ordered  If you have labs (blood work) drawn today and your tests are completely normal, you will receive your results only by: . MyChart Message (if you have MyChart) OR . A paper copy in the mail If you have any lab test that is abnormal or we need to change your treatment, we will call you to review the results.   Testing/Procedures: None Ordered   Follow-Up: At [facility], you and your health needs are our priority.  As part of our continuing mission to provide you with exceptional heart care, we have created designated Provider Care Teams.  These Care Teams include your primary Cardiologist (physician) and Advanced Practice Providers (APPs -  Physician Assistants and Nurse Practitioners) who all work together to provide you with the care you need, when you need it. You will need a follow up appointment in:  6 months.  Please call our office 2 months in advance to schedule this appointment.  You may see Philip Nahser, MD or one of the following Advanced Practice Providers on your designated Care Team: Scott Weaver, PA-C Vin Bhagat, PA-C . Janine Hammond, NP   

## 2018-07-16 ENCOUNTER — Telehealth: Payer: Self-pay | Admitting: *Deleted

## 2018-07-16 NOTE — Telephone Encounter (Signed)
Pt consent to telephone   Confirm consent - "In the setting of the current Covid19 crisis, you are scheduled for a (phone or video) visit with your provider on (date) at (time).  Just as we do with many in-office visits, in order for you to participate in this visit, we must obtain consent.  If you'd like, I can send this to your mychart (if signed up) or email for you to review.  Otherwise, I can obtain your verbal consent now.  All virtual visits are billed to your insurance company just like a normal visit would be.  By agreeing to a virtual visit, we'd like you to understand that the technology does not allow for your provider to perform an examination, and thus may limit your provider's ability to fully assess your condition. If your provider identifies any concerns that need to be evaluated in person, we will make arrangements to do so.  Finally, though the technology is pretty good, we cannot assure that it will always work on either your or our end, and in the setting of a video visit, we may have to convert it to a phone-only visit.  In either situation, we cannot ensure that we have a secure connection.  Are you willing to proceed?" STAFF: Did the patient verbally acknowledge consent to telehealth visit? Document YES/NO here: YES    FULL LENGTH CONSENT FOR TELE-HEALTH VISIT   I hereby voluntarily request, consent and authorize [facility] and its employed or contracted physicians, physician assistants, nurse practitioners or other licensed health care professionals (the Practitioner), to provide me with telemedicine health care services (the "Services") as deemed necessary by the treating Practitioner. I acknowledge and consent to receive the Services by the Practitioner via telemedicine. I understand that the telemedicine visit will involve communicating with the Practitioner through live audiovisual communication technology and the disclosure of certain medical information by electronic  transmission. I acknowledge that I have been given the opportunity to request an in-person assessment or other available alternative prior to the telemedicine visit and am voluntarily participating in the telemedicine visit.  I understand that I have the right to withhold or withdraw my consent to the use of telemedicine in the course of my care at any time, without affecting my right to future care or treatment, and that the Practitioner or I may terminate the telemedicine visit at any time. I understand that I have the right to inspect all information obtained and/or recorded in the course of the telemedicine visit and may receive copies of available information for a reasonable fee.  I understand that some of the potential risks of receiving the Services via telemedicine include:  Marland Kitchen Delay or interruption in medical evaluation due to technological equipment failure or disruption; . Information transmitted may not be sufficient (e.g. poor resolution of images) to allow for appropriate medical decision making by the Practitioner; and/or  . In rare instances, security protocols could fail, causing a breach of personal health information.  Furthermore, I acknowledge that it is my responsibility to provide information about my medical history, conditions and care that is complete and accurate to the best of my ability. I acknowledge that Practitioner's advice, recommendations, and/or decision may be based on factors not within their control, such as incomplete or inaccurate data provided by me or distortions of diagnostic images or specimens that may result from electronic transmissions. I understand that the practice of medicine is not an exact science and that Practitioner makes no warranties or  guarantees regarding treatment outcomes. I acknowledge that I will receive a copy of this consent concurrently upon execution via email to the email address I last provided but may also request a printed copy by  calling the office of [facility].    I understand that my insurance will be billed for this visit.   I have read or had this consent read to me. . I understand the contents of this consent, which adequately explains the benefits and risks of the Services being provided via telemedicine.  . I have been provided ample opportunity to ask questions regarding this consent and the Services and have had my questions answered to my satisfaction. . I give my informed consent for the services to be provided through the use of telemedicine in my medical care  By participating in this telemedicine visit I agree to the above.

## 2018-07-21 ENCOUNTER — Telehealth: Payer: Self-pay | Admitting: Physician Assistant

## 2018-07-21 ENCOUNTER — Telehealth: Payer: Self-pay

## 2018-07-21 ENCOUNTER — Telehealth (INDEPENDENT_AMBULATORY_CARE_PROVIDER_SITE_OTHER): Payer: 59 | Admitting: Physician Assistant

## 2018-07-21 ENCOUNTER — Other Ambulatory Visit: Payer: Self-pay

## 2018-07-21 ENCOUNTER — Encounter: Payer: Self-pay | Admitting: Physician Assistant

## 2018-07-21 VITALS — Ht 63.0 in | Wt 196.0 lb

## 2018-07-21 DIAGNOSIS — I208 Other forms of angina pectoris: Secondary | ICD-10-CM | POA: Diagnosis not present

## 2018-07-21 DIAGNOSIS — R002 Palpitations: Secondary | ICD-10-CM

## 2018-07-21 DIAGNOSIS — R5383 Other fatigue: Secondary | ICD-10-CM

## 2018-07-21 DIAGNOSIS — N183 Chronic kidney disease, stage 3 unspecified: Secondary | ICD-10-CM | POA: Insufficient documentation

## 2018-07-21 DIAGNOSIS — Z7189 Other specified counseling: Secondary | ICD-10-CM

## 2018-07-21 DIAGNOSIS — I10 Essential (primary) hypertension: Secondary | ICD-10-CM

## 2018-07-21 DIAGNOSIS — I350 Nonrheumatic aortic (valve) stenosis: Secondary | ICD-10-CM

## 2018-07-21 HISTORY — DX: Chronic kidney disease, stage 3 unspecified: N18.30

## 2018-07-21 NOTE — Patient Instructions (Addendum)
Medication Instructions:  Your physician recommends that you continue on your current medications as directed. Please refer to the Current Medication list given to you today.  If you need a refill on your cardiac medications before your next appointment, please call your pharmacy.   Lab work: TODAY BE DONE THROUGH A HOME VISIT: BMET, CBC,TSH  If you have labs (blood work) drawn today and your tests are completely normal, you will receive your results only by: Marland Kitchen MyChart Message (if you have MyChart) OR . A paper copy in the mail If you have any lab test that is abnormal or we need to change your treatment, we will call you to review the results.  Testing/Procedures: Your physician has recommended that you wear a 3-14 DAY LONG-TERM holter monitor. Holter monitors are medical devices that record the heart's electrical activity. Doctors most often use these monitors to diagnose arrhythmias. Arrhythmias are problems with the speed or rhythm of the heartbeat. The monitor is a small, portable device. You can wear one while you do your normal daily activities. This is usually used to diagnose what is causing palpitations/syncope (passing out).    Follow-Up: At [facility], you and your health needs are our priority.  As part of our continuing mission to provide you with exceptional heart care, we have created designated Provider Care Teams.  These Care Teams include your primary Cardiologist (physician) and Advanced Practice Providers (APPs -  Physician Assistants and Nurse Practitioners) who all work together to provide you with the care you need, when you need it. You will need a follow up appointment in:  4-6 weeks.    You may see Mertie Moores, MD or Richardson Dopp, PA-C

## 2018-07-21 NOTE — Progress Notes (Signed)
Virtual Visit via Telephone Note   This visit type was conducted due to national recommendations for restrictions regarding the COVID-19 Pandemic (e.g. social distancing) in an effort to limit this patient's exposure and mitigate transmission in our community.  Due to her co-morbid illnesses, this patient is at least at moderate risk for complications without adequate follow up.  This format is felt to be most appropriate for this patient at this time.  The patient did not have access to video technology/had technical difficulties with video requiring transitioning to audio format only (telephone).  All issues noted in this document were discussed and addressed.  No physical exam could be performed with this format.  Please refer to the patient's chart for her  consent to telehealth for [facility]   Date:  07/21/2018   ID:  [name], DOB 1923/02/25, MRN [id number]  Patient Location: Home Provider Location: Home  PCP:  Nolene Ebbs, MD  Cardiologist:  Mertie Moores, MD   Electrophysiologist:  None  GI:  Dr. Oletta Lamas  Evaluation Performed:  Follow-Up Visit  Chief Complaint:  Fatigue   History of Present Illness:    [name] is a 85 y.o. female with mild nonobstructive coronary artery disease by cardiac catheterization 2010, mild aortic stenosis, chronic kidney disease stage III, hypertension, prior GI bleeding.  She was admitted in August 2019 with chest pain.  Cardiac enzymes remain negative.  Echocardiogram demonstrated mild aortic stenosis.  Given her advanced age and chronic kidney disease, she was felt be a poor candidate for cardiac catheterization being higher risk for contrast-induced nephropathy.  She was placed on nitrates with good control of her symptoms.  She was last seen by Dr. Acie Fredrickson in December 2019.  Today, she notes she has been tired.  She is sleeping more.  She has chronic shortness of breath without change.  She has not had any chest pain.  She describes  hearing her heartbeat.  She will take NTG and it will go away.  She does not have any pain with this.  She has not had orthopnea, paroxysmal nocturnal dyspnea, edema.  She has not had syncope.    The patient does not have symptoms concerning for COVID-19 infection (fever, chills, cough, or new shortness of breath).    Past Medical History:  Diagnosis Date  . Acid reflux    takes Nexium daily  . Anemia    takes Ferrous Sulfate daily  . Aortic stenosis    mild AS pk grad 18, mean grad 9, AVA 1.32 cm 03/2015 echo (Dr. Einar Gip)  . Arthritis   . Chronic kidney disease (CKD), stage III (moderate) (HCC)   . Diverticulitis   . Hematochezia 03/2015  . High cholesterol    can't take the meds  . History of blood transfusion 03/2015   no abnormal reaction noted  . History of GI diverticular bleed   . HTN (hypertension)    takes Losartan-HCTZ and Metoprolol daily  . Joint pain   . Joint swelling   . Nocturia   . Numbness    in legs  . Peripheral edema    occasionally  . Shortness of breath dyspnea    occasionally and with exertion  . Slow urinary stream    at times   Past Surgical History:  Procedure Laterality Date  . ABCESS DRAINAGE     abdominal abcess  . cataract surgery Bilateral   . CHOLECYSTECTOMY    . DILATION AND CURETTAGE OF UTERUS    .  ESOPHAGOGASTRODUODENOSCOPY N/A 02/23/2015   Procedure: ESOPHAGOGASTRODUODENOSCOPY (EGD);  Surgeon: Laurence Spates, MD;  Location: [facility] ENDOSCOPY;  Service: Endoscopy;  Laterality: N/A;  . FLEXIBLE SIGMOIDOSCOPY N/A 02/24/2015   Procedure: FLEXIBLE SIGMOIDOSCOPY;  Surgeon: Laurence Spates, MD;  Location: [facility];  Service: Endoscopy;  Laterality: N/A;  . IR ANGIOGRAM SELECTIVE EACH ADDITIONAL VESSEL  03/06/2017  . IR ANGIOGRAM SELECTIVE EACH ADDITIONAL VESSEL  03/06/2017  . IR ANGIOGRAM SELECTIVE EACH ADDITIONAL VESSEL  03/06/2017  . IR ANGIOGRAM SELECTIVE EACH ADDITIONAL VESSEL  03/08/2017  . IR ANGIOGRAM VISCERAL SELECTIVE  03/06/2017  . IR  ANGIOGRAM VISCERAL SELECTIVE  03/06/2017  . IR ANGIOGRAM VISCERAL SELECTIVE  03/06/2017  . IR ANGIOGRAM VISCERAL SELECTIVE  03/08/2017  . IR ANGIOGRAM VISCERAL SELECTIVE  03/08/2017  . IR EMBO ART  VEN HEMORR LYMPH EXTRAV  INC GUIDE ROADMAPPING  03/06/2017  . IR EMBO ART  VEN HEMORR LYMPH EXTRAV  INC GUIDE ROADMAPPING  03/08/2017  . IR US GUIDE VASC ACCESS RIGHT  03/06/2017  . IR US GUIDE VASC ACCESS RIGHT  03/08/2017  . left knee surgery    . TOTAL HIP ARTHROPLASTY Left 05/17/2015   Procedure: LEFT TOTAL HIP ARTHROPLASTY ANTERIOR APPROACH;  Surgeon: Mcarthur Rossetti, MD;  Location: [place];  Service: Orthopedics;  Laterality: Left;     Current Meds  Medication Sig  . acetaminophen (TYLENOL) 325 MG tablet Take 325 mg by mouth every 6 (six) hours as needed. pain  . calcium carbonate (TUMS - DOSED IN MG ELEMENTAL CALCIUM) 500 MG chewable tablet Chew 1 tablet by mouth daily as needed for indigestion or heartburn.  . Cholecalciferol (VITAMIN D3) 2000 units TABS Take 2,000 Units by mouth daily.  . diclofenac sodium (VOLTAREN) 1 % GEL Apply 2 g topically 2 (two) times daily as needed (pain).   . ferrous sulfate 325 (65 FE) MG tablet Take 325 mg by mouth daily with breakfast.  . isosorbide mononitrate (IMDUR) 30 MG 24 hr tablet Take 1 tablet (30 mg total) by mouth daily.  . metoprolol tartrate (LOPRESSOR) 25 MG tablet Take 12.5 mg by mouth 2 (two) times daily.   . Multiple Vitamin (MULITIVITAMIN WITH MINERALS) TABS Take 1 tablet by mouth daily.  . nitroGLYCERIN (NITROSTAT) 0.4 MG SL tablet Place 0.4 mg under the tongue every 5 (five) minutes as needed for chest pain.   Marland Kitchen ondansetron (ZOFRAN) 8 MG tablet Take 8 mg by mouth every 8 (eight) hours as needed for nausea or vomiting.  . polyethylene glycol powder (GLYCOLAX/MIRALAX) powder Take 17 g by mouth daily as needed for mild constipation.  Marland Kitchen PROAIR HFA 108 (90 Base) MCG/ACT inhaler Inhale 1-2 puffs into the lungs every 4 (four) hours as needed for  shortness of breath or wheezing.     Allergies:   Ezetimibe; Lipitor [atorvastatin]; Pravachol [pravastatin]; Statins; Zocor [simvastatin]; and Cholestyramine   Social History   Tobacco Use  . Smoking status: Former Research scientist (life sciences)  . Smokeless tobacco: Never Used  . Tobacco comment: quit smoking 8+ yrs ago  Substance Use Topics  . Alcohol use: No    Comment: quit yrs ago  . Drug use: No     Family Hx: The patient's family history includes Cancer in an other family member; Diabetes in an other family member; Heart disease in an other family member; Hypertension in an other family member.  ROS:   Please see the history of present illness.    She has not had hematochezia, hematuria.  She has dark stools from taking  iron.  She has not had fever, cough. All other systems reviewed and are negative.   Prior CV studies:   The following studies were reviewed today:  Echocardiogram 09/30/2017 EF 55-60, normal wall motion, grade 2 diastolic dysfunction, mild aortic stenosis (mean 11), MAC, moderate LAE, mild RAE, mild TR, PASP 33  Cardiac catheterization 01/08/2009 EF 55-60 LAD proximal 15-20, mid 30-40; D2 ostial 20 LCx proximal 20-25, distal 40-50 RCA proximal 20-25, mid 50-55, distal 20-25  Labs/Other Tests and Data Reviewed:    EKG:  No ECG reviewed.  Recent Labs: 09/29/2017: ALT 9 10/01/2017: BUN 33; Creatinine, Ser 1.75; Hemoglobin 10.3; Platelets 217; Potassium 4.1; Sodium 140   Recent Lipid Panel   Wt Readings from Last 3 Encounters:  07/21/18 196 lb (88.9 kg)  01/21/18 196 lb (88.9 kg)  10/23/17 196 lb (88.9 kg)     Objective:    Vital Signs:  Ht _0  (1.6 m)   Wt 196 lb (88.9 kg)   BMI 34.72 kg/m    VITAL SIGNS:  reviewed GEN:  no acute distress RESPIRATORY:  no labored breathing NEURO:  alert and oriented PSYCH:  good mood  ASSESSMENT & PLAN:    Stable angina ([place]) She has a history of mild nonobstructive coronary artery disease by cardiac catheterization  in 2010.  She was admitted last year with chest pain.  She was not felt to be a good candidate for cardiac catheterization given her chronic kidney disease in the setting of advanced age.  Therefore, she has been treated medically.  She has done well since that time without recurrent chest symptoms.  She has had some recent palpitations that seem to respond to nitroglycerin therapy.  However, she denies having any discomfort with this.  Continue current dose of isosorbide and metoprolol.  Arrange home visit to obtain blood pressure and lab work.  If blood pressure can tolerate it, I will increase her isosorbide further.  CKD (chronic kidney disease) stage 3, GFR 30-59 ml/min (HCC) Recent creatinine stable.  Arrange labs as outlined below.  Fatigue, unspecified type She notes symptoms of fatigue and daytime sleepiness.  She does have a history of GI bleeding as well as anemia.  I think it is important that we recheck her CBC to make sure that her anemia has not worsened, explaining her current symptoms.  -Arrange home visit for BP, labs  -Labs: BMET, CBC, TSH  Palpitations She describes what sounds like palpitations.  However, she does not really have any discomfort.  In any event, I will arrange a long-term cardiac monitor 3-14 days.  This would also help rule out bradycardia as a cause for her current symptoms.  Follow-up with Dr. Acie Fredrickson or me in 4 to 6 weeks.  Nonrheumatic aortic valve stenosis Mild by echocardiogram in August 2019.  Essential hypertension She has not taken her blood pressure.  She is not sure if her machine is working correctly.  She has ordered a new one.  I will arrange a home visit to check her blood pressure and also to check her machine for accuracy.  COVID-19 Education: The signs and symptoms of COVID-19 were discussed with the patient and how to seek care for testing (follow up with PCP or arrange E-visit).  The importance of social distancing was discussed today.   Time:   Today, I have spent 17 minutes with the patient with telehealth technology discussing the above problems.     Medication Adjustments/Labs and Tests Ordered: Current medicines are reviewed  at length with the patient today.  Concerns regarding medicines are outlined above.   Tests Ordered: Orders Placed This Encounter  Procedures  . LONG TERM MONITOR (3-14 DAYS)    Medication Changes: No orders of the defined types were placed in this encounter.   Disposition:  Follow up in 4 week(s) with Dr. Acie Fredrickson or Richardson Dopp  Signed, Richardson Dopp, PA-C  07/21/2018 4:25 PM    [place]

## 2018-07-21 NOTE — Telephone Encounter (Signed)
[facility] Visit Initial Request  Date of Request ([place]):  July 21, 2018  Requesting Provider:  Richardson Dopp, PA-C     Agency Requested:    [facility] Contact:  Brenda Buff, NP [address] [place], [place] [address] Phone #:  [phone] Fax #:  [phone]  Patient Demographic Information: Name:  [name] Age:  85 y.o.   DOB:  September 08, 1923  MRN:  [id number]   Address:   [address] [place] [address]   Phone Numbers:   Home Phone [phone]     Emergency Contact Information on File:   Contact Information    Name Relation Home Work Mobile   [name] Daughter [phone]        The above family members may be contacted for information on this patient (review DPR on file):  No    Patient Clinical Information:  Primary Care Provider:  Nolene Ebbs, MD  Primary Cardiologist:  Brenda Moores, MD  Primary Electrophysiologist:  None   Past Medical Hx: [name]  has a past medical history of Acid reflux, Anemia, Aortic stenosis, Arthritis, Chronic kidney disease (CKD), stage III (moderate) ([place]), Diverticulitis, Hematochezia (03/2015), High cholesterol, History of blood transfusion (03/2015), History of GI diverticular bleed, HTN (hypertension), Joint pain, Joint swelling, Nocturia, Numbness, Peripheral edema, Shortness of breath dyspnea, and Slow urinary stream.   Allergies: She is allergic to ezetimibe; lipitor [atorvastatin]; pravachol [pravastatin]; statins; zocor [simvastatin]; and cholestyramine.   Medications: Current Outpatient Medications on File Prior to Visit  Medication Sig  . acetaminophen (TYLENOL) 325 MG tablet Take 325 mg by mouth every 6 (six) hours as needed. pain  . calcium carbonate (TUMS - DOSED IN MG ELEMENTAL CALCIUM) 500 MG chewable tablet Chew 1 tablet by mouth daily as needed for indigestion or heartburn.  . Cholecalciferol (VITAMIN D3) 2000 units TABS Take 2,000 Units by mouth  daily.  . diclofenac sodium (VOLTAREN) 1 % GEL Apply 2 g topically 2 (two) times daily as needed (pain).   . ferrous sulfate 325 (65 FE) MG tablet Take 325 mg by mouth daily with breakfast.  . isosorbide mononitrate (IMDUR) 30 MG 24 hr tablet Take 1 tablet (30 mg total) by mouth daily.  . metoprolol tartrate (LOPRESSOR) 25 MG tablet Take 12.5 mg by mouth 2 (two) times daily.   . Multiple Vitamin (MULITIVITAMIN WITH MINERALS) TABS Take 1 tablet by mouth daily.  . nitroGLYCERIN (NITROSTAT) 0.4 MG SL tablet Place 0.4 mg under the tongue every 5 (five) minutes as needed for chest pain.   Marland Kitchen ondansetron (ZOFRAN) 8 MG tablet Take 8 mg by mouth every 8 (eight) hours as needed for nausea or vomiting.  . polyethylene glycol powder (GLYCOLAX/MIRALAX) powder Take 17 g by mouth daily as needed for mild constipation.  Marland Kitchen PROAIR HFA 108 (90 Base) MCG/ACT inhaler Inhale 1-2 puffs into the lungs every 4 (four) hours as needed for shortness of breath or wheezing.   No current facility-administered medications on file prior to visit.      Social Hx: She  reports that she has quit smoking. She has never used smokeless tobacco. She reports that she does not drink alcohol or use drugs.    Diagnosis/Reason for Visit:   She has a hx of GI bleeding and stable angina that is managed medically.  She is feeling fatigued and sleepy.  She needs labs to rule out worsening anemia.  Please arrange visit in the next 2 days  if possible. Also, please check her BP and compare with her cuff for accuracy.  Services Requested:  Vital Signs (BP, Pulse, O2, Weight)  Physical Exam  Labs:  BMET, CBC, TSH  # of Visits Needed/Frequency per Week: 1   A copy of the office note will be faxed with this form.  All labs ordered for this home visit have been released and [name]

## 2018-07-21 NOTE — Telephone Encounter (Signed)
Attempted to leave VM on answering machine asking for a call back for a home visit appointment.

## 2018-07-22 NOTE — Progress Notes (Signed)
[name]      DOB: [birth year]  Purpose of Visit: VS, Wt, exam, BMET, CBC, TSH Cardiac provider: Dr. Acie Fredrickson  Home visit on behalf of [facility].  Progress note will be faxed to the referring provider.   Medications: Is the patient taking all medications listed on MAR from Epic? Yes  List any medications that are not being taken correctly:n/a  List any medication refills needed: none  Is the patient able to pick up medications? Not currently as vehicle won't start but has a daughter that can.  Vitals: BP:  158/80   HR: 60   Oxygen: 97% RA Weight:  195.4lb        Physical Exam:  Lung sounds: clear  Heart sounds: regular  Peripheral edema: no  Wounds: no  Location:  Any patient concerns?  C/o fatigue, but not more than usual.  Also that her vehicle won't start so she's unable to run her own errands but has a daughter that helps her.  She gets weekly meal deliveries-this weeks was delivered during this visit.  She was able to ambulate, unassisted to the bathroom to weigh w/o difficulty or shob.  She does have a walker she uses prn.  Her home BP cuff is a wrist cuff and seems to run lower than the manual reading obtained during this visit.  She stated she plans on ordering a new arm BP cuff.  No other needs/issues expressed.   ReDS Vest/Clip Reading: n/a  Rhythm Strip: n/a  Is Home Health recommended? No If yes, state reason:   Vanita Ingles, RN 07/22/18

## 2018-07-23 LAB — BASIC METABOLIC PANEL
BUN/Creatinine Ratio: 14 (ref 12–28)
BUN: 17 mg/dL (ref 10–36)
CO2: 18 mmol/L — ABNORMAL LOW (ref 20–29)
Calcium: 9.4 mg/dL (ref 8.7–10.3)
Chloride: 103 mmol/L (ref 96–106)
Creatinine, Ser: 1.21 mg/dL — ABNORMAL HIGH (ref 0.57–1.00)
GFR calc Af Amer: 45 mL/min/{1.73_m2} — ABNORMAL LOW (ref >59)
GFR calc non Af Amer: 39 mL/min/{1.73_m2} — ABNORMAL LOW (ref >59)
Glucose: 86 mg/dL (ref 65–99)
Potassium: 4.7 mmol/L (ref 3.5–5.2)
Sodium: 139 mmol/L (ref 134–144)

## 2018-07-23 LAB — TSH: TSH: 1.28 u[IU]/mL (ref 0.450–4.500)

## 2018-07-23 LAB — CBC

## 2018-07-24 ENCOUNTER — Telehealth: Payer: Self-pay | Admitting: Physician Assistant

## 2018-07-24 DIAGNOSIS — I350 Nonrheumatic aortic (valve) stenosis: Secondary | ICD-10-CM

## 2018-07-24 DIAGNOSIS — N183 Chronic kidney disease, stage 3 unspecified: Secondary | ICD-10-CM

## 2018-07-24 DIAGNOSIS — I208 Other forms of angina pectoris: Secondary | ICD-10-CM

## 2018-07-24 MED ORDER — ISOSORBIDE MONONITRATE ER 30 MG PO TB24: 60.0000 mg | ORAL_TABLET | Freq: Every day | ORAL | 11 refills | Status: DC

## 2018-07-24 NOTE — Telephone Encounter (Signed)
Reviewed home visit notes.   BP 158/80.  Therefore, I think she can tolerate an increase in her Isosorbide. PLAN:  1. Increase Isosorbide mononitrate to 60 mg once daily. Richardson Dopp, PA-C    07/24/2018 9:24 AM

## 2018-07-24 NOTE — Telephone Encounter (Signed)
Spoke with patient and patient aware of change. Patient verbalized understanding and wrote down clinic number if any questions

## 2018-07-25 MED ORDER — ISOSORBIDE MONONITRATE ER 30 MG PO TB24: 60.0000 mg | ORAL_TABLET | Freq: Every day | ORAL | 3 refills | Status: DC

## 2018-07-25 NOTE — Addendum Note (Signed)
Addended by: Claude Manges on: 07/25/2018 04:55 PM   Modules accepted: Orders

## 2018-07-25 NOTE — Telephone Encounter (Signed)
Spoke with patient ans ware of results and verbalized understanding. Patient said shell be looking out for a phone call from Nurse for home visit     [place] Visit Initial Request  Date of Request ([place]):  July 25, 2018   Requesting Provider:  Richardson Dopp PA    Agency Requested:    [facility] Contact:  Glory Buff, NP [address] [place], [place] [address] Phone #:  [phone] Fax #:  [phone]   Patient Demographic Information: Name:  [name] Age:  85 y.o.   DOB:  05/20/1923  MRN:  [id number]   Address:   [address] [place] [address]   Phone Numbers:   Home Phone [phone]     Emergency Contact Information on File:   Contact Information    Name Relation Home Work Mobile   [name],Brenda Daughter [phone]        The above family members may be contacted for information on this patient (review DPR on file):  Yes    Patient Clinical Information:  Primary Care Provider:  Nolene Ebbs, MD  Primary Cardiologist:  Mertie Moores, MD  Primary Electrophysiologist:  None   Past Medical Hx: [name]  has a past medical history of Acid reflux, Anemia, Aortic stenosis, Arthritis, Chronic kidney disease (CKD), stage III (moderate) ([place]), Diverticulitis, Hematochezia (03/2015), High cholesterol, History of blood transfusion (03/2015), History of GI diverticular bleed, HTN (hypertension), Joint pain, Joint swelling, Nocturia, Numbness, Peripheral edema, Shortness of breath dyspnea, and Slow urinary stream.   Allergies: She is allergic to ezetimibe; lipitor [atorvastatin]; pravachol [pravastatin]; statins; zocor [simvastatin]; and cholestyramine.   Medications: Current Outpatient Medications on File Prior to Visit  Medication Sig  . acetaminophen (TYLENOL) 325 MG tablet Take 325 mg by mouth every 6 (six) hours as needed. pain  . calcium carbonate (TUMS - DOSED IN MG ELEMENTAL CALCIUM) 500 MG chewable  tablet Chew 1 tablet by mouth daily as needed for indigestion or heartburn.  . Cholecalciferol (VITAMIN D3) 2000 units TABS Take 2,000 Units by mouth daily.  . diclofenac sodium (VOLTAREN) 1 % GEL Apply 2 g topically 2 (two) times daily as needed (pain).   . ferrous sulfate 325 (65 FE) MG tablet Take 325 mg by mouth daily with breakfast.  . metoprolol tartrate (LOPRESSOR) 25 MG tablet Take 12.5 mg by mouth 2 (two) times daily.   . Multiple Vitamin (MULITIVITAMIN WITH MINERALS) TABS Take 1 tablet by mouth daily.  . nitroGLYCERIN (NITROSTAT) 0.4 MG SL tablet Place 0.4 mg under the tongue every 5 (five) minutes as needed for chest pain.   Marland Kitchen ondansetron (ZOFRAN) 8 MG tablet Take 8 mg by mouth every 8 (eight) hours as needed for nausea or vomiting.  . polyethylene glycol powder (GLYCOLAX/MIRALAX) powder Take 17 g by mouth daily as needed for mild constipation.  Marland Kitchen PROAIR HFA 108 (90 Base) MCG/ACT inhaler Inhale 1-2 puffs into the lungs every 4 (four) hours as needed for shortness of breath or wheezing.   No current facility-administered medications on file prior to visit.      Social Hx: She  reports that she has quit smoking. She has never used smokeless tobacco. She reports that she does not drink alcohol or use drugs.    Diagnosis/Reason for Visit:   Repeat lab /clotted tube  Services Requested:  Labs:  CBC  # of Visits Needed/Frequency per Week:  Once   A copy of the office note  will be faxed with this form. *All labs ordered for this home visit have been released and the request was [facility] [place] [facility].

## 2018-07-28 NOTE — Progress Notes (Signed)
Home Visit on behalf of [facility] to redraw CBC d/t the sample last week having hemolyzed.  Sample obtained w/o difficulty and brought to the [facility] on [address]  [name] was in good spirits, said she had a good weekend w/her children having visited and mowed her yard, and that she was feeling good overall.  No concerns or needs expressed.

## 2018-07-29 LAB — CBC
Hematocrit: 32.5 % — ABNORMAL LOW (ref 34.0–46.6)
Hemoglobin: 11.2 g/dL (ref 11.1–15.9)
MCH: 30 pg (ref 26.6–33.0)
MCHC: 34.5 g/dL (ref 31.5–35.7)
MCV: 87 fL (ref 79–97)
Platelets: 245 10*3/uL (ref 150–450)
RBC: 3.73 x10E6/uL — ABNORMAL LOW (ref 3.77–5.28)
RDW: 13.2 % (ref 11.7–15.4)
WBC: 2.7 10*3/uL — ABNORMAL LOW (ref 3.4–10.8)

## 2018-07-30 ENCOUNTER — Telehealth: Payer: Self-pay | Admitting: Radiology

## 2018-07-30 NOTE — Telephone Encounter (Signed)
Enrolled patient for a 14 day Zio monitor to be mailed. Brief instructions were gone over and patient knows to expect the monitor to arrive in 3-4 days 

## 2018-07-31 ENCOUNTER — Telehealth: Payer: Self-pay | Admitting: Cardiovascular Disease

## 2018-07-31 NOTE — Telephone Encounter (Signed)
New Message     Pt c/o medication issue:  1. Name of Medication: Isosorbide Mononitrate  2. How are you currently taking this medication (dosage and times per day)? 30mg  2 tablets daily   3. Are you having a reaction (difficulty breathing--STAT)? N/A  4. What is your medication issue? Patient states she was told to increase medication from 1 a day to 2 a day and since her balance has been off.  Also her bottom lip on the inside has been swollen and red.

## 2018-08-01 NOTE — Telephone Encounter (Signed)
I don't think the swelling would be coming from the Imdur. If it continues she can hold it for a couple days to see if it improves. Recommend she discuss with her PCP. If her balance issues continue, try changing Imdur to 30 mg twice daily. Richardson Dopp, PA-C    08/01/2018 2:51 PM

## 2018-08-01 NOTE — Telephone Encounter (Signed)
Spoke with patient about lab results  Spoke with patient about balance issues and redness  and swollen lip Patient stated she doesn't know if its the increase isosorbide .60 mg daily or something else.  Patient stated she noticed lip swelling redness after drinking a cup of coffee.Patient was asked about the temperature of the coffee and to see if it was to hot. Patient stated it was fine the temperature of the coffee. Patient stated that some of the swelling ans redness has gotten better   Patient keep saying I don't know if its this medicine or not she just notice and was wondering could it be?  Patient was told provider would be notified of these new symptoms

## 2018-08-04 NOTE — Telephone Encounter (Signed)
Spoke with patient who says her symptoms has gotten better and is back taking Isosorbide 60 mg once a day.  .Patient says she will call her PCP to follow up and will contact us back if any questions or concerns

## 2018-08-08 ENCOUNTER — Ambulatory Visit (INDEPENDENT_AMBULATORY_CARE_PROVIDER_SITE_OTHER): Payer: 59

## 2018-08-08 DIAGNOSIS — I10 Essential (primary) hypertension: Secondary | ICD-10-CM | POA: Diagnosis not present

## 2018-08-08 DIAGNOSIS — R002 Palpitations: Secondary | ICD-10-CM

## 2018-08-08 DIAGNOSIS — R5383 Other fatigue: Secondary | ICD-10-CM

## 2018-08-08 DIAGNOSIS — I208 Other forms of angina pectoris: Secondary | ICD-10-CM

## 2018-08-12 ENCOUNTER — Ambulatory Visit: Payer: 59

## 2018-08-29 ENCOUNTER — Other Ambulatory Visit: Payer: Self-pay

## 2018-08-29 ENCOUNTER — Encounter: Payer: Self-pay | Admitting: Cardiovascular Disease

## 2018-08-29 ENCOUNTER — Telehealth: Payer: 59 | Admitting: Cardiovascular Disease

## 2018-08-29 ENCOUNTER — Telehealth (INDEPENDENT_AMBULATORY_CARE_PROVIDER_SITE_OTHER): Payer: 59 | Admitting: Cardiovascular Disease

## 2018-08-29 VITALS — BP 161/82 | HR 60 | Ht 63.0 in | Wt 199.0 lb

## 2018-08-29 DIAGNOSIS — R002 Palpitations: Secondary | ICD-10-CM | POA: Diagnosis not present

## 2018-08-29 DIAGNOSIS — Z7189 Other specified counseling: Secondary | ICD-10-CM

## 2018-08-29 DIAGNOSIS — R079 Chest pain, unspecified: Secondary | ICD-10-CM

## 2018-08-29 DIAGNOSIS — I350 Nonrheumatic aortic (valve) stenosis: Secondary | ICD-10-CM

## 2018-08-29 DIAGNOSIS — I10 Essential (primary) hypertension: Secondary | ICD-10-CM

## 2018-08-29 DIAGNOSIS — I208 Other forms of angina pectoris: Secondary | ICD-10-CM

## 2018-08-29 MED ORDER — ISOSORBIDE MONONITRATE ER 60 MG PO TB24: 60.0000 mg | ORAL_TABLET | Freq: Every day | ORAL | 3 refills | Status: DC

## 2018-08-29 NOTE — Progress Notes (Signed)
Virtual Visit via Telephone Note   This visit type was conducted due to national recommendations for restrictions regarding the COVID-19 Pandemic (e.g. social distancing) in an effort to limit this patient's exposure and mitigate transmission in our community.  Due to her co-morbid illnesses, this patient is at least at moderate risk for complications without adequate follow up.  This format is felt to be most appropriate for this patient at this time.  The patient did not have access to video technology/had technical difficulties with video requiring transitioning to audio format only (telephone).  All issues noted in this document were discussed and addressed.  No physical exam could be performed with this format.  Please refer to the patient's chart for her  consent to telehealth for [facility].   Date:  08/29/2018   ID:  Brenda GladeBertha C Longenecker, DOB 02/17/1923, MRN [id number]  Patient Location: Home Provider Location: Office  PCP:  Fleet ContrasAvbuere, Edwin, MD  Cardiologist:  Kristeen MissPhilip Alyissa Whidbee, MD  Electrophysiologist:  None   Evaluation Performed:  Follow-Up Visit  Previous notes:  Problem list 1.  Mild aortic stenosis 2.  Chronic kidney disease stage III 3. Essential hypertension 4. History of GI bleed 5. Minimal coronary artery disease   Chief Complaint:    History of Present Illness:    [name] is a 85 y.o. female with history of nonobstructive coronary artery disease by heart cath in 2010.  She was recently seen by Tereso NewcomerScott Weaver, PA for some episodes of chest discomfort and palpitations.  These palpitations seem to at least partially resolved with sublingual nitroglycerin. BP is elevated. Today .  Has had some leg swelling     The patient does not have symptoms concerning for COVID-19 infection (fever, chills, cough, or new shortness of breath).    Past Medical History:  Diagnosis Date  . Acid reflux    takes Nexium daily  . Anemia    takes Ferrous Sulfate daily  . Aortic  stenosis    mild AS pk grad 18, mean grad 9, AVA 1.32 cm 03/2015 echo (Dr. Jacinto HalimGanji)  . Arthritis   . Chronic kidney disease (CKD), stage III (moderate) (HCC)   . Diverticulitis   . Hematochezia 03/2015  . High cholesterol    can't take the meds  . History of blood transfusion 03/2015   no abnormal reaction noted  . History of GI diverticular bleed   . HTN (hypertension)    takes Losartan-HCTZ and Metoprolol daily  . Joint pain   . Joint swelling   . Nocturia   . Numbness    in legs  . Peripheral edema    occasionally  . Shortness of breath dyspnea    occasionally and with exertion  . Slow urinary stream    at times   Past Surgical History:  Procedure Laterality Date  . ABCESS DRAINAGE     abdominal abcess  . cataract surgery Bilateral   . CHOLECYSTECTOMY    . DILATION AND CURETTAGE OF UTERUS    . ESOPHAGOGASTRODUODENOSCOPY N/A 02/23/2015   Procedure: ESOPHAGOGASTRODUODENOSCOPY (EGD);  Surgeon: Carman ChingJames Edwards, MD;  [facility];  Service: Endoscopy;  Laterality: N/A;  . FLEXIBLE SIGMOIDOSCOPY N/A 02/24/2015   Procedure: FLEXIBLE SIGMOIDOSCOPY;  Surgeon: Carman ChingJames Edwards, MD;  [facility];  Service: Endoscopy;  Laterality: N/A;  . IR ANGIOGRAM SELECTIVE EACH ADDITIONAL VESSEL  03/06/2017  . IR ANGIOGRAM SELECTIVE EACH ADDITIONAL VESSEL  03/06/2017  . IR ANGIOGRAM SELECTIVE EACH ADDITIONAL VESSEL  03/06/2017  .  IR ANGIOGRAM SELECTIVE EACH ADDITIONAL VESSEL  03/08/2017  . IR ANGIOGRAM VISCERAL SELECTIVE  03/06/2017  . IR ANGIOGRAM VISCERAL SELECTIVE  03/06/2017  . IR ANGIOGRAM VISCERAL SELECTIVE  03/06/2017  . IR ANGIOGRAM VISCERAL SELECTIVE  03/08/2017  . IR ANGIOGRAM VISCERAL SELECTIVE  03/08/2017  . IR EMBO ART  VEN HEMORR LYMPH EXTRAV  INC GUIDE ROADMAPPING  03/06/2017  . IR EMBO ART  VEN HEMORR LYMPH EXTRAV  INC GUIDE ROADMAPPING  03/08/2017  . IR US GUIDE VASC ACCESS RIGHT  03/06/2017  . IR US GUIDE VASC ACCESS RIGHT  03/08/2017  . left knee surgery    . TOTAL HIP  ARTHROPLASTY Left 05/17/2015   Procedure: LEFT TOTAL HIP ARTHROPLASTY ANTERIOR APPROACH;  Surgeon: Kathryne Hitchhristopher Y Blackman, MD;  Location: [facility] OR;  Service: Orthopedics;  Laterality: Left;     Current Meds  Medication Sig  . acetaminophen (TYLENOL) 325 MG tablet Take 325 mg by mouth every 6 (six) hours as needed. pain  . calcium carbonate (TUMS - DOSED IN MG ELEMENTAL CALCIUM) 500 MG chewable tablet Chew 1 tablet by mouth daily as needed for indigestion or heartburn.  . Cholecalciferol (VITAMIN D3) 2000 units TABS Take 2,000 Units by mouth daily.  . diclofenac sodium (VOLTAREN) 1 % GEL Apply 2 g topically 2 (two) times daily as needed (pain).   . ferrous sulfate 325 (65 FE) MG tablet Take 325 mg by mouth daily with breakfast.  . hydrochlorothiazide (MICROZIDE) 12.5 MG capsule Take 12.5 mg by mouth daily.  . isosorbide mononitrate (IMDUR) 30 MG 24 hr tablet Take 2 tablets (60 mg total) by mouth daily.  . metoprolol tartrate (LOPRESSOR) 25 MG tablet Take 12.5 mg by mouth 2 (two) times daily.   . Multiple Vitamin (MULITIVITAMIN WITH MINERALS) TABS Take 1 tablet by mouth daily.  . nitroGLYCERIN (NITROSTAT) 0.4 MG SL tablet Place 0.4 mg under the tongue every 5 (five) minutes as needed for chest pain.   Marland Kitchen. ondansetron (ZOFRAN) 8 MG tablet Take 8 mg by mouth every 8 (eight) hours as needed for nausea or vomiting.  . polyethylene glycol powder (GLYCOLAX/MIRALAX) powder Take 17 g by mouth daily as needed for mild constipation.  Marland Kitchen. PROAIR HFA 108 (90 Base) MCG/ACT inhaler Inhale 1-2 puffs into the lungs every 4 (four) hours as needed for shortness of breath or wheezing.     Allergies:   Ezetimibe, Lipitor [atorvastatin], Pravachol [pravastatin], Statins, Zocor [simvastatin], and Cholestyramine   Social History   Tobacco Use  . Smoking status: Former Games developermoker  . Smokeless tobacco: Never Used  . Tobacco comment: quit smoking 8+ yrs ago  Substance Use Topics  . Alcohol use: No    Comment: quit yrs ago  .  Drug use: No     Family Hx: The patient's family history includes Cancer in an other family member; Diabetes in an other family member; Heart disease in an other family member; Hypertension in an other family member.  ROS:   Please see the history of present illness.     All other systems reviewed and are negative.   Prior CV studies:   The following studies were reviewed today:    Labs/Other Tests and Data Reviewed:    EKG:  No ECG reviewed.  Recent Labs: 09/29/2017: ALT 9 07/22/2018: BUN 17; Creatinine, Ser 1.21; Potassium 4.7; Sodium 139; TSH 1.280 07/28/2018: Hemoglobin 11.2; Platelets 245   Recent Lipid Panel Lab Results  Component Value Date/Time   CHOL (H) 01/06/2009 01:49 AM  209        ATP III CLASSIFICATION:  <200     mg/dL   Desirable  200-239  mg/dL   Borderline High  >=240    mg/dL   High          TRIG 51 01/06/2009 01:49 AM   HDL 56 01/06/2009 01:49 AM   CHOLHDL 3.7 01/06/2009 01:49 AM   LDLCALC (H) 01/06/2009 01:49 AM    143        Total Cholesterol/HDL:CHD Risk Coronary Heart Disease Risk Table                     Men   Women  1/2 Average Risk   3.4   3.3  Average Risk       5.0   4.4  2 X Average Risk   9.6   7.1  3 X Average Risk  23.4   11.0        Use the calculated Patient Ratio above and the CHD Risk Table to determine the patient's CHD Risk.        ATP III CLASSIFICATION (LDL):  <100     mg/dL   Optimal  100-129  mg/dL   Near or Above                    Optimal  130-159  mg/dL   Borderline  160-189  mg/dL   High  >190     mg/dL   Very High    Wt Readings from Last 3 Encounters:  08/29/18 199 lb (90.3 kg)  07/22/18 195 lb 6.4 oz (88.6 kg)  07/21/18 196 lb (88.9 kg)     Objective:    Vital Signs:  BP (!) 179/84   Pulse 60   Ht 5\' 3"  (1.6 m)   Wt 199 lb (90.3 kg)   BMI 35.25 kg/m      ASSESSMENT & PLAN:    1. Chest pain :   She is better after increasing the Imdur to 60 mg a day .   Continue curren tdose  2.  HTN:    BP is a bit elevated today but she states that it is typically well controlled.  Continue current medications for now.  She will follow-up with her primary medical doctor as well.  COVID-19 Education: The signs and symptoms of COVID-19 were discussed with the patient and how to seek care for testing (follow up with PCP or arrange E-visit).  The importance of social distancing was discussed today.  Time:   Today, I have spent  18  minutes with the patient with telehealth technology discussing the above problems.     Medication Adjustments/Labs and Tests Ordered: Current medicines are reviewed at length with the patient today.  Concerns regarding medicines are outlined above.   Tests Ordered: No orders of the defined types were placed in this encounter.   Medication Changes: No orders of the defined types were placed in this encounter.   Follow Up:  Virtual Visit or In Person in 6 month(s)  With Richardson Dopp, PA  Signed, Mertie Moores, MD  08/29/2018 4:09 PM    [facility]

## 2018-08-29 NOTE — Patient Instructions (Signed)
Called Pt and discussed the following recommendations. She had no additional questions or concerns.   Medication Instructions:  Your physician has recommended you make the following change in your medication:   1. Increase Imdur to 60mg  tablet, once per day.  Labwork: None ordered.  Testing/Procedures: None ordered.  Follow-Up: Your physician recommends that you schedule a follow-up appointment in:   6 months with Richardson Dopp, PA   Any Other Special Instructions Will Be Listed Below (If Applicable).     If you need a refill on your cardiac medications before your next appointment, please call your pharmacy.

## 2018-09-02 ENCOUNTER — Other Ambulatory Visit: Payer: Self-pay

## 2018-12-23 IMAGING — US IR US GUIDE VASC ACCESS RIGHT
1 series · 1 of 1 positions shown · non-contrast
Comparison: none

ADDENDUM:
Patency of the accessed right common femoral artery was confirmed on
ultrasound, and image documentation stored.
INDICATION: Lower GI bleed. Vascular lesion embolized 03/06/2017, with suspicion
of intermittent bleeding from additional site. Clinically, patient
has had continued lower GI bleeding requiring additional
transfusion.

[Series 1: ir us guide vasc access right · 1 of 1 slices shown]
[im 1/1]
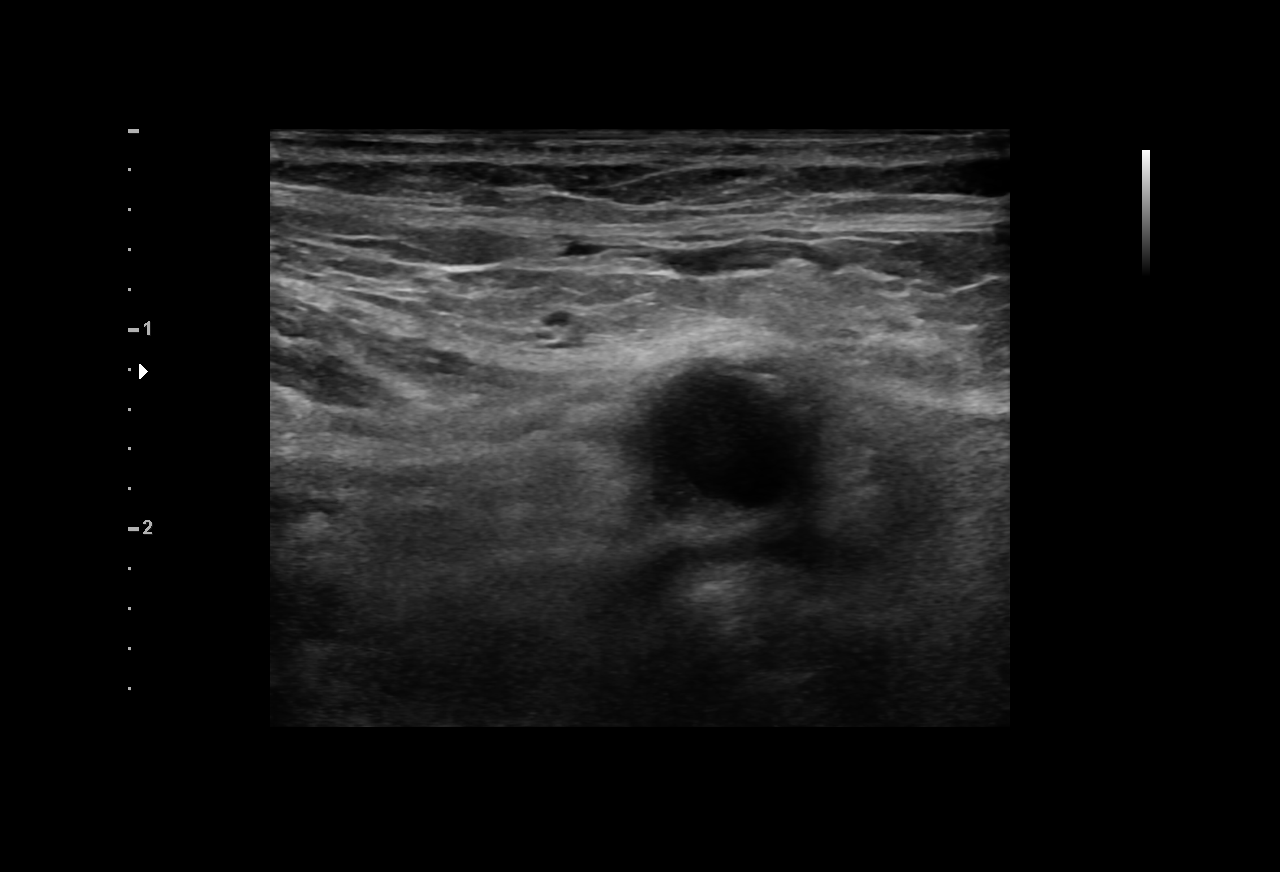

[1 of 1 positions shown; findings below may reference images not displayed]

EXAM:
MESENTERIC ARTERIOGRAM 2 VESSEL

ARTERIAL EMBOLIZATION  THIRD ORDER

ULTRASOUND GUIDANCE FOR ARTERIAL ACCESS

MEDICATIONS:
No antibiotics indicated

ANESTHESIA/SEDATION:
Intravenous Fentanyl and Versed were administered as conscious
sedation during continuous monitoring of the patient's level of
consciousness and physiological / cardiorespiratory status by the
radiology RN, with a total moderate sedation time of 47 minutes.

CONTRAST:  [id number]

FLUOROSCOPY TIME:  6 minutes, 48 second, 363 mGy

COMPLICATIONS:
None immediate.



Right femoral region prepped and draped in usual sterile fashion.
Maximal barrier sterile technique was utilized including caps, mask,
sterile gowns, sterile gloves, sterile drape, hand hygiene and skin
antiseptic.

The right common femoral artery was localized under ultrasound.
Under real-time ultrasound guidance, the vessel was accessed with a
21-gauge micropuncture needle, exchanged over a 018 guidewire for a
transitional dilator, through which a 035 guidewire was advanced.
Over this, a 5 French vascular sheath was placed, through which a 5
French RIM catheter was advanced and used to selectively catheterize
the inferior mesenteric artery for selective arteriography in
multiple projections. The previously embolized segment showed
continued closure. No active extravasation was identified on non
selective and sub selective contrast injection. No additional
vascular lesion identified. Normal venous phase.

Empiric embolization of the third order branch supplying the
bleeding region seen previously was performed using a single 2 mm x
4 cm interlock coil. Follow-up confirmatory arteriogram was
obtained.

The RIM catheter was then exchanged for a C2, utilized to
selectively catheterize the superior mesenteric artery for selective
arteriography in multiple projections. No active extravasation or
vascular lesion was identified. Venous phase normal.

The catheter and sheath were removed and hemostasis achieved with
the aid of the Exoseal device after confirmatory femoral
arteriography.

The patient tolerated the procedure well.
IMPRESSION: 1. Technically successful coil embolization of the inferior
mesenteric artery third order branch supplying region of active
extravasation seen on previous examination.
2. No additional areas of active extravasation or vascular lesion on
selective superior and inferior mesenteric arteriography.

## 2019-03-03 ENCOUNTER — Ambulatory Visit: Payer: 59 | Admitting: Physician Assistant

## 2019-03-03 NOTE — Progress Notes (Deleted)
Cardiology Office Note  Date: 03/03/2019   ID: [name], DOB 08-Oct-1923, MRN [id number]  PCP:  Nolene Ebbs, MD  Cardiologist:  Mertie Moores, MD Electrophysiologist:  None   No chief complaint on file.   History of Present Illness: [name] is a 86 y.o. female last encounter August 29, 2018 with Dr. Katharina Caper.  History of chest pain, palpitations, hypertension, nonobstructive coronary artery disease, chronic kidney disease stage III, GERD, hyperlipidemia..  Patient had been experiencing some chest pain partially resolved with sublingual nitroglycerin.  Blood pressure was elevated at last visit but patient stated it was usually normal at home.  Also complained of some lower extremity edema.  Her chest pain had improved at last visit after increasing Imdur to 60 mg daily.  Past Medical History:  Diagnosis Date  . Acid reflux    takes Nexium daily  . Anemia    takes Ferrous Sulfate daily  . Aortic stenosis    mild AS pk grad 18, mean grad 9, AVA 1.32 cm 03/2015 echo (Dr. Einar Gip)  . Arthritis   . Chronic kidney disease (CKD), stage III (moderate) (HCC)   . Diverticulitis   . Hematochezia 03/2015  . High cholesterol    can't take the meds  . History of blood transfusion 03/2015   no abnormal reaction noted  . History of GI diverticular bleed   . HTN (hypertension)    takes Losartan-HCTZ and Metoprolol daily  . Joint pain   . Joint swelling   . Nocturia   . Numbness    in legs  . Peripheral edema    occasionally  . Shortness of breath dyspnea    occasionally and with exertion  . Slow urinary stream    at times    Past Surgical History:  Procedure Laterality Date  . ABCESS DRAINAGE     abdominal abcess  . cataract surgery Bilateral   . CHOLECYSTECTOMY    . DILATION AND CURETTAGE OF UTERUS    . ESOPHAGOGASTRODUODENOSCOPY N/A 02/23/2015   Procedure: ESOPHAGOGASTRODUODENOSCOPY (EGD);  Surgeon: Laurence Spates, MD;  [facility] END[facility]COPY;  Service: Endoscopy;   Laterality: N/A;  . FLEXIBLE SIGMOIDOSCOPY N/A 02/24/2015   Procedure: FLEXIBLE SIGMOIDOSCOPY;  Surgeon: Laurence Spates, MD;  Location: [place];  Service: Endoscopy;  Laterality: N/A;  . IR ANGIOGRAM SELECTIVE EACH ADDITIONAL VESSEL  03/06/2017  . IR ANGIOGRAM SELECTIVE EACH ADDITIONAL VESSEL  03/06/2017  . IR ANGIOGRAM SELECTIVE EACH ADDITIONAL VESSEL  03/06/2017  . IR ANGIOGRAM SELECTIVE EACH ADDITIONAL VESSEL  03/08/2017  . IR ANGIOGRAM VISCERAL SELECTIVE  03/06/2017  . IR ANGIOGRAM VISCERAL SELECTIVE  03/06/2017  . IR ANGIOGRAM VISCERAL SELECTIVE  03/06/2017  . IR ANGIOGRAM VISCERAL SELECTIVE  03/08/2017  . IR ANGIOGRAM VISCERAL SELECTIVE  03/08/2017  . IR EMBO ART  VEN HEMORR LYMPH EXTRAV  INC GUIDE ROADMAPPING  03/06/2017  . IR EMBO ART  VEN HEMORR LYMPH EXTRAV  INC GUIDE ROADMAPPING  03/08/2017  . IR US GUIDE VASC ACCESS RIGHT  03/06/2017  . IR US GUIDE VASC ACCESS RIGHT  03/08/2017  . left knee surgery    . TOTAL HIP ARTHROPLASTY Left 05/17/2015   Procedure: LEFT TOTAL HIP ARTHROPLASTY ANTERIOR APPROACH;  Surgeon: Mcarthur Rossetti, MD;  Location: [place];  Service: Orthopedics;  Laterality: Left;    Current Outpatient Medications  Medication Sig Dispense Refill  . acetaminophen (TYLENOL) 325 MG tablet Take 325 mg by mouth every 6 (six) hours as needed. pain    .  calcium carbonate (TUMS - DOSED IN MG ELEMENTAL CALCIUM) 500 MG chewable tablet Chew 1 tablet by mouth daily as needed for indigestion or heartburn.    . Cholecalciferol (VITAMIN D3) 2000 units TABS Take 2,000 Units by mouth daily.    . diclofenac sodium (VOLTAREN) 1 % GEL Apply 2 g topically 2 (two) times daily as needed (pain).     . ferrous sulfate 325 (65 FE) MG tablet Take 325 mg by mouth daily with breakfast.    . hydrochlorothiazide (MICROZIDE) 12.5 MG capsule Take 12.5 mg by mouth daily.    . isosorbide mononitrate (IMDUR) 60 MG 24 hr tablet Take 1 tablet (60 mg total) by mouth daily. 90 tablet 3  . metoprolol tartrate  (LOPRESSOR) 25 MG tablet Take 12.5 mg by mouth 2 (two) times daily.     . Multiple Vitamin (MULITIVITAMIN WITH MINERALS) TABS Take 1 tablet by mouth daily.    . nitroGLYCERIN (NITROSTAT) 0.4 MG SL tablet Place 0.4 mg under the tongue every 5 (five) minutes as needed for chest pain.     Marland Kitchen ondansetron (ZOFRAN) 8 MG tablet Take 8 mg by mouth every 8 (eight) hours as needed for nausea or vomiting.    . polyethylene glycol powder (GLYCOLAX/MIRALAX) powder Take 17 g by mouth daily as needed for mild constipation.    Marland Kitchen PROAIR HFA 108 (90 Base) MCG/ACT inhaler Inhale 1-2 puffs into the lungs every 4 (four) hours as needed for shortness of breath or wheezing.     No current facility-administered medications for this visit.   Allergies:  Ezetimibe, Lipitor [atorvastatin], Pravachol [pravastatin], Statins, Zocor [simvastatin], and Cholestyramine   Social History: The patient  reports that she has quit smoking. She has never used smokeless tobacco. She reports that she does not drink alcohol or use drugs.   Family History: The patient's family history includes Cancer in an other family member; Diabetes in an other family member; Heart disease in an other family member; Hypertension in an other family member.   ROS:  Please see the history of present illness. Otherwise, complete review of systems is positive for none.  All other systems are reviewed and negative.   Physical Exam: VS:  There were no vitals taken for this visit., BMI There is no height or weight on file to calculate BMI.  Wt Readings from Last 3 Encounters:  08/29/18 199 lb (90.3 kg)  07/22/18 195 lb 6.4 oz (88.6 kg)  07/21/18 196 lb (88.9 kg)    General: Patient appears comfortable at rest. HEENT: Conjunctiva and lids normal, oropharynx clear with moist mucosa. Neck: Supple, no elevated JVP or carotid bruits, no thyromegaly. Lungs: Clear to auscultation, nonlabored breathing at rest. Cardiac: Regular rate and rhythm, no S3 or  significant systolic murmur, no pericardial rub. Abdomen: Soft, nontender, no hepatomegaly, bowel sounds present, no guarding or rebound. Extremities: No pitting edema, distal pulses 2+. Skin: Warm and dry. Musculoskeletal: No kyphosis. Neuropsychiatric: Alert and oriented x3, affect grossly appropriate.  ECG:  An ECG dated September 30, 2017 was personally reviewed today and demonstrated:  Sinus bradycardia rate of 54 first-degree AV block, septal infarct, age undetermined  Recent Labwork: 07/22/2018: BUN 17; Creatinine, Ser 1.21; Potassium 4.7; Sodium 139; TSH 1.280 07/28/2018: Hemoglobin 11.2; Platelets 245     Component Value Date/Time   CHOL (H) 01/06/2009 0149    209        ATP III CLASSIFICATION:  <200     mg/dL   Desirable  960-454  mg/dL  Borderline High  >=240    mg/dL   High          TRIG 51 01/06/2009 0149   HDL 56 01/06/2009 0149   CHOLHDL 3.7 01/06/2009 0149   VLDL 10 01/06/2009 0149   LDLCALC (H) 01/06/2009 0149    143        Total Cholesterol/HDL:CHD Risk Coronary Heart Disease Risk Table                     Men   Women  1/2 Average Risk   3.4   3.3  Average Risk       5.0   4.4  2 X Average Risk   9.6   7.1  3 X Average Risk  23.4   11.0        Use the calculated Patient Ratio above and the CHD Risk Table to determine the patient's CHD Risk.        ATP III CLASSIFICATION (LDL):  <100     mg/dL   Optimal  606-301  mg/dL   Near or Above                    Optimal  130-159  mg/dL   Borderline  601-093  mg/dL   High  >235     mg/dL   Very High    Other Studies Reviewed Today:  Recent Zio patch placement from August 08, 2018 to August 22, 2018.   Preliminary Findings Final Interpretation Patient had a min HR of 40 bpm, max HR of 84 bpm, and avg HR of 58 bpm. Predominant underlying rhythm was Sinus Rhythm. First Degree AV Block was present. Bundle Branch Block/IVCD was present. Isolated SVEs were rare (<1.0%), and no SVE Couplets or SVE Triplets were  present. Isolated VEs were rare (<1.0%), and no VE Couplets or VE Triplets were Present.  Echocardiogram September 30, 2017 Study Conclusions  - Left ventricle: The cavity size was normal. Systolic function was   normal. The estimated ejection fraction was in the range of 55%   to 60%. Wall motion was normal; there were no regional wall   motion abnormalities. Features are consistent with a pseudonormal   left ventricular filling pattern, with concomitant abnormal   relaxation and increased filling pressure (grade 2 diastolic   dysfunction). Doppler parameters are consistent with high   ventricular filling pressure. - Aortic valve: Mildly to moderately calcified annulus. Trileaflet;   mildly thickened, mildly calcified leaflets. There was very mild   stenosis. Mean gradient (S): 11 mm Hg. Valve area (VTI): 1.3   cm^2. Valve area (Vmax): 1.31 cm^2. Valve area (Vmean): 1.45   cm^2. - Mitral valve: Calcified annulus. - Left atrium: The atrium was moderately dilated. Volume/bsa, ES,   (1-plane Simpson&'s, A2C): 55.9 ml/m^2. - Right atrium: The atrium was mildly dilated. - Tricuspid valve: There was mild regurgitation. - Pulmonary arteries: PA peak pressure: 33 mm Hg (S).  Assessment and Plan:  1. Essential hypertension   2. Aortic valve stenosis, etiology of cardiac valve disease unspecified   3. Palpitations   4. Chest pain, unspecified type      Medication Adjustments/Labs and Tests Ordered: Current medicines are reviewed at length with the patient today.  Concerns regarding medicines are outlined above.    There are no Patient Instructions on file for this visit.       Signed, Rennis Harding, NP 03/03/2019 1:00 PM    [facility]  at [place], [place], [place] [address] Phone: [phone]; Fax: [phone]

## 2019-04-23 ENCOUNTER — Ambulatory Visit: Payer: 59 | Attending: Internal Medicine

## 2019-04-27 ENCOUNTER — Ambulatory Visit: Payer: 59

## 2019-05-21 ENCOUNTER — Emergency Department (HOSPITAL_COMMUNITY): Payer: 59

## 2019-05-21 ENCOUNTER — Emergency Department (HOSPITAL_COMMUNITY)
Admission: EM | Admit: 2019-05-21 | Discharge: 2019-05-21 | Disposition: A | Discharge status: Home / Self Care | Payer: 59 | Attending: Emergency Medicine | Admitting: Emergency Medicine

## 2019-05-21 ENCOUNTER — Other Ambulatory Visit: Payer: Self-pay

## 2019-05-21 ENCOUNTER — Encounter (HOSPITAL_COMMUNITY): Payer: Self-pay | Admitting: Emergency Medicine

## 2019-05-21 DIAGNOSIS — Z87891 Personal history of nicotine dependence: Secondary | ICD-10-CM | POA: Insufficient documentation

## 2019-05-21 DIAGNOSIS — R202 Paresthesia of skin: Secondary | ICD-10-CM | POA: Insufficient documentation

## 2019-05-21 DIAGNOSIS — I129 Hypertensive chronic kidney disease with stage 1 through stage 4 chronic kidney disease, or unspecified chronic kidney disease: Secondary | ICD-10-CM | POA: Diagnosis not present

## 2019-05-21 DIAGNOSIS — Z79899 Other long term (current) drug therapy: Secondary | ICD-10-CM | POA: Diagnosis not present

## 2019-05-21 DIAGNOSIS — N183 Chronic kidney disease, stage 3 unspecified: Secondary | ICD-10-CM | POA: Insufficient documentation

## 2019-05-21 DIAGNOSIS — Z96642 Presence of left artificial hip joint: Secondary | ICD-10-CM | POA: Insufficient documentation

## 2019-05-21 LAB — URINALYSIS, ROUTINE W REFLEX MICROSCOPIC
Bilirubin Urine: NEGATIVE
Glucose, UA: NEGATIVE mg/dL
Hgb urine dipstick: NEGATIVE
Ketones, ur: NEGATIVE mg/dL
Leukocytes,Ua: NEGATIVE
Nitrite: NEGATIVE
Protein, ur: NEGATIVE mg/dL
Specific Gravity, Urine: 1.008 (ref 1.005–1.030)
pH: 7 (ref 5.0–8.0)

## 2019-05-21 LAB — COMPREHENSIVE METABOLIC PANEL
ALT: 12 U/L (ref 0–44)
AST: 20 U/L (ref 15–41)
Albumin: 3.8 g/dL (ref 3.5–5.0)
Alkaline Phosphatase: 48 U/L (ref 38–126)
Anion gap: 6 (ref 5–15)
BUN: 21 mg/dL (ref 8–23)
CO2: 26 mmol/L (ref 22–32)
Calcium: 9.3 mg/dL (ref 8.9–10.3)
Chloride: 107 mmol/L (ref 98–111)
Creatinine, Ser: 1.17 mg/dL — ABNORMAL HIGH (ref 0.44–1.00)
GFR calc Af Amer: 47 mL/min — ABNORMAL LOW (ref >60)
GFR calc non Af Amer: 40 mL/min — ABNORMAL LOW (ref >60)
Glucose, Bld: 92 mg/dL (ref 70–99)
Potassium: 4.1 mmol/L (ref 3.5–5.1)
Sodium: 139 mmol/L (ref 135–145)
Total Bilirubin: 0.7 mg/dL (ref 0.3–1.2)
Total Protein: 6.9 g/dL (ref 6.5–8.1)

## 2019-05-21 LAB — CBC WITH DIFFERENTIAL/PLATELET
Abs Immature Granulocytes: 0.01 10*3/uL (ref 0.00–0.07)
Basophils Absolute: 0 10*3/uL (ref 0.0–0.1)
Basophils Relative: 1 %
Eosinophils Absolute: 0.1 10*3/uL (ref 0.0–0.5)
Eosinophils Relative: 3 %
HCT: 35.2 % — ABNORMAL LOW (ref 36.0–46.0)
Hemoglobin: 11.2 g/dL — ABNORMAL LOW (ref 12.0–15.0)
Immature Granulocytes: 0 %
Lymphocytes Relative: 22 %
Lymphs Abs: 0.8 10*3/uL (ref 0.7–4.0)
MCH: 30.9 pg (ref 26.0–34.0)
MCHC: 31.8 g/dL (ref 30.0–36.0)
MCV: 97.2 fL (ref 80.0–100.0)
Monocytes Absolute: 0.4 10*3/uL (ref 0.1–1.0)
Monocytes Relative: 11 %
Neutro Abs: 2.4 10*3/uL (ref 1.7–7.7)
Neutrophils Relative %: 63 %
Platelets: 237 10*3/uL (ref 150–400)
RBC: 3.62 MIL/uL — ABNORMAL LOW (ref 3.87–5.11)
RDW: 15.1 % (ref 11.5–15.5)
WBC: 3.8 10*3/uL — ABNORMAL LOW (ref 4.0–10.5)
nRBC: 0 % (ref 0.0–0.2)

## 2019-05-21 LAB — TROPONIN I (HIGH SENSITIVITY)
Troponin I (High Sensitivity): 12 ng/L (ref <18)
Troponin I (High Sensitivity): 11 ng/L (ref <18)

## 2019-05-21 NOTE — ED Provider Notes (Signed)
[facility]-EMERGENCY DEPT Provider Note   CSN: [id number] Arrival date & time: 05/21/19  0941     History Chief Complaint  Patient presents with  . hand numbness  . Flank Pain    [name] is a 86 y.o. female with past medical history of CKD, hypertension, hyperlipidemia, anemia, presenting to the ED with multiple complaints. First complaint is bilateral hand numbness that has been intermittent for the past several weeks but worsened last night.  Reports bilateral decrease in sensation that will occur every night when she tries to sleep.  Usually will last several minutes.  Last night symptoms lasted for longer than a few minutes and she had to "shake my hands out" to help with the sensation.  She also had symptoms when she woke up this morning.  Symptoms are equal and bilateral.  She cannot recall any incident several weeks ago that may have triggered the symptoms.  She denies any numbness in legs, injuries or falls, headache, vision changes, fever or neck pain. She also complains of left-sided flank pain.  This pain is also been intermittent for several months.  States that anytime she tells her PCP about this pain "he just mashes on it and does not tell me what is causing it."  No specific aggravating or alleviating factor, usually when the pain comes on she will need to sit still for it to improve.  She denies any urinary symptoms, diarrhea, vomiting, fever, shortness of breath, cough.  HPI     Past Medical History:  Diagnosis Date  . Acid reflux    takes Nexium daily  . Anemia    takes Ferrous Sulfate daily  . Aortic stenosis    mild AS pk grad 18, mean grad 9, AVA 1.32 cm 03/2015 echo (Dr. Jacinto Halim)  . Arthritis   . Chronic kidney disease (CKD), stage III (moderate)   . Diverticulitis   . Hematochezia 03/2015  . High cholesterol    can't take the meds  . History of blood transfusion 03/2015   no abnormal reaction noted  . History of GI diverticular  bleed   . HTN (hypertension)    takes Losartan-HCTZ and Metoprolol daily  . Joint pain   . Joint swelling   . Nocturia   . Numbness    in legs  . Peripheral edema    occasionally  . Shortness of breath dyspnea    occasionally and with exertion  . Slow urinary stream    at times    Patient Active Problem List   Diagnosis Date Noted  . CKD (chronic kidney disease) stage 3, GFR 30-59 ml/min 07/21/2018  . Stable angina (HCC) 01/21/2018  . Bradycardia 10/23/2017  . Chest pain 09/29/2017  . Acute lower GI bleeding 03/06/2017  . Diverticula, colon 03/06/2017  . HTN (hypertension) 03/06/2017  . Acute kidney injury (HCC) 03/06/2017  . Acute blood loss anemia 03/06/2017  . Hiatal hernia 11/28/2016  . Chest pain, rule out acute myocardial infarction 11/27/2016  . Hyperlipidemia 05/24/2015  . Aortic stenosis 05/24/2015  . GERD without esophagitis 05/23/2015  . Postoperative anemia due to acute blood loss 05/23/2015  . Osteoarthritis of left hip 05/17/2015  . 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 GI bleed 02/21/2015  . Acute bronchitis 11/16/2012  . Bright red blood per rectum 11/13/2012  . Lower GI bleed 02/23/2012  . History of GI diverticular bleed 02/23/2012  . Diverticulosis 02/23/2012    Past Surgical History:  Procedure Laterality Date  . ABCESS DRAINAGE     abdominal abcess  . cataract surgery Bilateral   . CHOLECYSTECTOMY    . DILATION AND CURETTAGE OF UTERUS    . ESOPHAGOGASTRODUODENOSCOPY N/A 02/23/2015   Procedure: ESOPHAGOGASTRODUODENOSCOPY (EGD);  Surgeon: Carman Ching, MD;  Location: [facility] ENDOSCOPY;  Service: Endoscopy;  Laterality: N/A;  . FLEXIBLE SIGMOIDOSCOPY N/A 02/24/2015   Procedure: FLEXIBLE SIGMOIDOSCOPY;  Surgeon: Carman Ching, MD;  Location: [facility] ENDOSCOPY;  Service: Endoscopy;  Laterality: N/A;  . IR ANGIOGRAM SELECTIVE EACH ADDITIONAL VESSEL  03/06/2017  . IR ANGIOGRAM SELECTIVE EACH ADDITIONAL VESSEL  03/06/2017  . IR ANGIOGRAM  SELECTIVE EACH ADDITIONAL VESSEL  03/06/2017  . IR ANGIOGRAM SELECTIVE EACH ADDITIONAL VESSEL  03/08/2017  . IR ANGIOGRAM VISCERAL SELECTIVE  03/06/2017  . IR ANGIOGRAM VISCERAL SELECTIVE  03/06/2017  . IR ANGIOGRAM VISCERAL SELECTIVE  03/06/2017  . IR ANGIOGRAM VISCERAL SELECTIVE  03/08/2017  . IR ANGIOGRAM VISCERAL SELECTIVE  03/08/2017  . IR EMBO ART  VEN HEMORR LYMPH EXTRAV  INC GUIDE ROADMAPPING  03/06/2017  . IR EMBO ART  VEN HEMORR LYMPH EXTRAV  INC GUIDE ROADMAPPING  03/08/2017  . IR US GUIDE VASC ACCESS RIGHT  03/06/2017  . IR US GUIDE VASC ACCESS RIGHT  03/08/2017  . left knee surgery    . TOTAL HIP ARTHROPLASTY Left 05/17/2015   Procedure: LEFT TOTAL HIP ARTHROPLASTY ANTERIOR APPROACH;  Surgeon: Kathryne Hitch, MD;  Location: [facility] OR;  Service: Orthopedics;  Laterality: Left;     OB History   No obstetric history on file.     Family History  Problem Relation Age of Onset  . Diabetes Other   . Hypertension Other   . Cancer Other   . Heart disease Other     Social History   Tobacco Use  . Smoking status: Former Games developer  . Smokeless tobacco: Never Used  . Tobacco comment: quit smoking 8+ yrs ago  Substance Use Topics  . Alcohol use: No    Comment: quit yrs ago  . Drug use: No    Home Medications Prior to Admission medications   Medication Sig Start Date End Date Taking? Authorizing Provider  acetaminophen (TYLENOL) 650 MG CR tablet Take 650 mg by mouth daily.   Yes [provider]  Cholecalciferol (VITAMIN D3) 2000 units TABS Take 2,000 Units by mouth daily.   Yes [provider]  diclofenac sodium (VOLTAREN) 1 % GEL Apply 2 g topically 2 (two) times daily as needed (pain).    Yes [provider]  ferrous sulfate 325 (65 FE) MG tablet Take 325 mg by mouth daily with breakfast.   Yes [provider]  hydrochlorothiazide (MICROZIDE) 12.5 MG capsule Take 12.5 mg by mouth daily. 08/26/18  Yes [provider]  isosorbide  mononitrate (IMDUR) 60 MG 24 hr tablet Take 1 tablet (60 mg total) by mouth daily. 08/29/18 05/21/19 Yes Nahser, Deloris Ping, MD  metoprolol tartrate (LOPRESSOR) 25 MG tablet Take 12.5 mg by mouth 2 (two) times daily.    Yes [provider]  Multiple Vitamin (MULITIVITAMIN WITH MINERALS) TABS Take 1 tablet by mouth daily.   Yes [provider]  nitroGLYCERIN (NITROSTAT) 0.4 MG SL tablet Place 0.4 mg under the tongue every 5 (five) minutes as needed for chest pain.    Yes [provider]  polyvinyl alcohol (LIQUIFILM TEARS) 1.4 % ophthalmic solution Place 1 drop into both eyes as needed for dry eyes.   Yes [provider]  PROAIR HFA 108 (90 Base) MCG/ACT inhaler Inhale 1-2 puffs into the lungs every 4 (four) hours as needed for shortness of breath or wheezing. 12/04/16  Yes [provider]    Allergies    Ezetimibe, Lipitor [atorvastatin], Pravachol [pravastatin], Statins, Zocor [simvastatin], and Cholestyramine  Review of Systems   Review of Systems  Constitutional: Negative for appetite change, chills and fever.  HENT: Negative for ear pain, rhinorrhea, sneezing and sore throat.   Eyes: Negative for photophobia and visual disturbance.  Respiratory: Negative for cough, chest tightness, shortness of breath and wheezing.   Cardiovascular: Negative for chest pain and palpitations.  Gastrointestinal: Negative for abdominal pain, blood in stool, constipation, diarrhea, nausea and vomiting.  Genitourinary: Positive for flank pain. Negative for dysuria, hematuria and urgency.  Musculoskeletal: Negative for myalgias.  Skin: Negative for rash.  Neurological: Positive for numbness. Negative for dizziness, weakness and light-headedness.    Physical Exam Updated Vital Signs BP (!) 159/80 (BP Location: Right Arm)   Pulse 80   Temp 98.2 F (36.8 C)   Resp 20   SpO2 98%   Physical Exam Vitals and nursing note reviewed.  Constitutional:      General: She  is not in acute distress.    Appearance: She is well-developed.  HENT:     Head: Normocephalic and atraumatic.     Nose: Nose normal.  Eyes:     General: No scleral icterus.       Right eye: No discharge.        Left eye: No discharge.     Conjunctiva/sclera: Conjunctivae normal.     Pupils: Pupils are equal, round, and reactive to light.  Cardiovascular:     Rate and Rhythm: Normal rate and regular rhythm.     Heart sounds: Normal heart sounds. No murmur. No friction rub. No gallop.   Pulmonary:     Effort: Pulmonary effort is normal. No respiratory distress.     Breath sounds: Normal breath sounds.  Abdominal:     General: Bowel sounds are normal. There is no distension.     Palpations: Abdomen is soft.     Tenderness: There is no abdominal tenderness. There is no guarding.  Musculoskeletal:        General: Normal range of motion.     Cervical back: Normal range of motion and neck supple.     Right lower leg: Edema present.     Left lower leg: Edema present.     Comments: Bilateral ankle edema. No calf tenderness.  Skin:    General: Skin is warm and dry.     Findings: No rash.  Neurological:     General: No focal deficit present.     Mental Status: She is alert and oriented to person, place, and time.     Cranial Nerves: No cranial nerve deficit.     Sensory: No sensory deficit.     Motor: No weakness or abnormal muscle tone.     Coordination: Coordination normal.     Comments: Pupils reactive. No facial asymmetry noted. Cranial nerves appear grossly intact. Sensation intact to light touch on face, BUE and BLE. Strength 5/5 in LUE, and BLE. Slightly decreased strength in RUE. Normal finger to nose coordination bilaterally.      ED Results / Procedures / Treatments   Labs (all labs ordered are listed, but only abnormal results are displayed) Labs Reviewed  COMPREHENSIVE METABOLIC PANEL - Abnormal; Notable for the following components:  Result Value   Creatinine,  Ser 1.17 (*)    GFR calc non Af Amer 40 (*)    GFR calc Af Amer 47 (*)    All other components within normal limits  CBC WITH DIFFERENTIAL/PLATELET - Abnormal; Notable for the following components:   WBC 3.8 (*)    RBC 3.62 (*)    Hemoglobin 11.2 (*)    HCT 35.2 (*)    All other components within normal limits  URINALYSIS, ROUTINE W REFLEX MICROSCOPIC - Abnormal; Notable for the following components:   Color, Urine STRAW (*)    All other components within normal limits  URINE CULTURE  TROPONIN I (HIGH SENSITIVITY)  TROPONIN I (HIGH SENSITIVITY)    EKG None  Radiology DG Chest 2 View  Result Date: 05/21/2019 CLINICAL DATA:  Left chest and flank pain EXAM: CHEST - 2 VIEW COMPARISON:  September 29, 2017 FINDINGS: Lungs are clear. Heart size and pulmonary vascularity are normal. There is a focal hiatal hernia. There is aortic atherosclerosis. No adenopathy. There is degenerative change in each shoulder. No pneumothorax. IMPRESSION: Lungs clear. Cardiac silhouette within normal limits. Hiatal hernia present. Aortic Atherosclerosis (ICD10-I70.0). Electronically Signed   By: Bretta BangWilliam  Woodruff III M.D.   On: 05/21/2019 14:46   MR BRAIN WO CONTRAST  Result Date: 05/21/2019 CLINICAL DATA:  Bilateral hand numbness. EXAM: MRI HEAD WITHOUT CONTRAST TECHNIQUE: Multiplanar, multiecho pulse sequences of the brain and surrounding structures were obtained without intravenous contrast. COMPARISON:  None. FINDINGS: Brain: Motion degraded study. Ventricle size and cerebral volume normal for age. Negative for acute infarct. Minimal chronic ischemic changes in the white matter. Chronic microhemorrhage in the posterior frontal cortex bilaterally Vascular: Normal arterial flow voids Skull and upper cervical spine: Negative Sinuses/Orbits: Mild mucosal edema paranasal sinuses. Bilateral cataract extraction Other: None IMPRESSION: Motion degraded study.  Negative for acute infarct Chronic microhemorrhage in the  posterior frontal cortex over the convexity bilaterally. Electronically Signed   By: Marlan Palauharles  Clark M.D.   On: 05/21/2019 14:45   MR Cervical Spine Wo Contrast  Result Date: 05/21/2019 CLINICAL DATA:  Bilateral hand numbness. EXAM: MRI CERVICAL SPINE WITHOUT CONTRAST TECHNIQUE: Multiplanar, multisequence MR imaging of the cervical spine was performed. No intravenous contrast was administered. COMPARISON:  MRI cervical spine 05/21/2009 FINDINGS: Alignment: Normal Vertebrae: Normal bone marrow.  Negative for fracture or mass. Cord: Normal spinal cord signal. Posterior Fossa, vertebral arteries, paraspinal tissues: Negative Disc levels: C2-3: Mild foraminal narrowing bilaterally due to uncinate spurring and facet hypertrophy C3-4: Mild spinal stenosis and moderate foraminal stenosis bilaterally due to diffuse uncinate spurring and facet hypertrophy. No change from the prior study. C4-5: Mild spinal stenosis. Marked right foraminal encroachment and moderate to severe left foraminal encroachment due to spurring. C5-6: Moderate spinal stenosis and moderate foraminal stenosis bilaterally due to prominent uncinate spurring. Spinal stenosis most severe on the left. Progressive left foraminal stenosis since the prior study. C6-7: Mild spinal stenosis and moderate foraminal stenosis bilaterally due to spurring. Progressive left foraminal stenosis compared with the prior study. C7-T1: Mild disc degeneration. Negative for spinal or foraminal stenosis. IMPRESSION: Multilevel spondylosis and facet degeneration throughout the cervical spine. There is significant spinal and foraminal stenosis at multiple levels. Mild progression since 2011. Electronically Signed   By: Marlan Palauharles  Clark M.D.   On: 05/21/2019 14:39   US Renal  Result Date: 05/21/2019 CLINICAL DATA:  86 year old female with left flank pain. History of hypertension and chronic kidney disease. EXAM: RENAL / URINARY TRACT ULTRASOUND COMPLETE COMPARISON:  Abdominal  ultrasound dated 01/11/2005 and CT abdomen pelvis dated 11/13/2012. FINDINGS: Right Kidney: Renal measurements: 9.6 x 4.4 x 4.6 cm = volume: 100 mL. Increased renal parenchymal echogenicity. No hydronephrosis or shadowing stone. There is a 2 cm upper pole cyst with a thin septation. Left Kidney: Renal measurements: 9.8 x 5.4 x 5.0 cm = volume: 140 mL. Mild increased echogenicity. No hydronephrosis or shadowing stone. Several cysts measuring up to 2.7 cm in the inferior pole of the left kidney. Bladder: Appears normal for degree of bladder distention. Other: None. IMPRESSION: 1. Mild increased renal parenchymal echogenicity in keeping with chronic kidney disease. No hydronephrosis or shadowing stone. 2. Bilateral renal cysts. Electronically Signed   By: Elgie Collard M.D.   On: 05/21/2019 15:23    Procedures Procedures (including critical care time)  Medications Ordered in ED Medications - No data to display  ED Course  I have reviewed the triage vital signs and the nursing notes.  Pertinent labs & imaging results that were available during my care of the patient were reviewed by me and considered in my medical decision making (see chart for details).    MDM Rules/Calculators/A&P                      86 year old female with a past medical history of CKD, hypertension, hyperlipidemia presenting to the ED with multiple complaints. #1 bilateral hand numbness that has been intermittent for the past several weeks but worsened last night.  Usually improves with movement.  Symptoms lasted longer than a few minutes last night so she was concerned.  Denies any neck pain, numbness in arms or legs, headache or vision changes.  On exam there is no change sensation noted.  There is some weakness in the right upper extremity patient does not seem to think this is different from her baseline.  MRI of the brain and cervical spine without any acute abnormalities.  Does show some degenerative changes that could  be causing her paresthesias.  Low suspicion for stroke. 2.  Left flank pain that has been intermittent for the past several months.  No current pain on my exam.  Initial dose troponin both negative.  Urinalysis without signs of infection.  Creatinine at baseline.  CBC is unremarkable.  Renal ultrasound is negative for acute abnormality.  Chest x-ray is negative for acute abnormality.  EKG without acute ischemic changes.  Suspect musculoskeletal cause of her symptoms.  Able to rule out ACS, UTI or kidney stone as a cause of her symptoms.  We will have her follow-up with PCP for this. Patient resting comfortably on exam.  Able to tolerate p.o. intake without difficulty. Return precautions given.  Patient is hemodynamically stable, in NAD. Evaluation does not show pathology that would require ongoing emergent intervention or inpatient treatment. I have personally reviewed and interpreted all lab work and imaging at today's ED visit. I explained the diagnosis to the patient. Pain has been managed and has no complaints prior to discharge. Patient is comfortable with above plan and is stable for discharge at this time. All questions were answered prior to disposition. Strict return precautions for returning to the ED were discussed. Encouraged follow up with PCP.   An After Visit Summary was printed and given to the patient.   Portions of this note were generated with Scientist, clinical (histocompatibility and immunogenetics). Dictation errors may occur despite best attempts at proofreading.  Final Clinical Impression(s) / ED Diagnoses Final diagnoses:  Paresthesias  Rx / DC Orders ED Discharge Orders    None       Delia Heady, PA-C 05/21/19 1639    Tegeler, Gwenyth Allegra, MD 05/21/19 2214

## 2019-05-21 NOTE — ED Triage Notes (Signed)
Pt reports at night when in bed for the past several weeks her hands will be numb. Pt c/o left flank pains for "a while and every time I go to a doctor and tell them about it, they do nothing about it". Pt reports left side pains are intermittent.

## 2019-05-21 NOTE — Discharge Instructions (Signed)
Continue your home medications as previously prescribed. Return to the ER if you start to experience worsening pain, chest pain, shortness of breath, numbness in arms or legs that has worsened.

## 2019-05-21 NOTE — ED Notes (Signed)
Patient placed on pure wic 

## 2019-05-22 LAB — URINE CULTURE: Culture: NO GROWTH

## 2019-06-16 ENCOUNTER — Ambulatory Visit (INDEPENDENT_AMBULATORY_CARE_PROVIDER_SITE_OTHER): Payer: 59 | Admitting: Physician Assistant

## 2019-06-16 ENCOUNTER — Encounter: Payer: Self-pay | Admitting: Physician Assistant

## 2019-06-16 ENCOUNTER — Other Ambulatory Visit: Payer: Self-pay

## 2019-06-16 VITALS — BP 140/56 | HR 65 | Ht 63.0 in | Wt 203.0 lb

## 2019-06-16 DIAGNOSIS — I10 Essential (primary) hypertension: Secondary | ICD-10-CM | POA: Diagnosis not present

## 2019-06-16 DIAGNOSIS — I350 Nonrheumatic aortic (valve) stenosis: Secondary | ICD-10-CM | POA: Diagnosis not present

## 2019-06-16 DIAGNOSIS — I2089 Other forms of angina pectoris: Secondary | ICD-10-CM

## 2019-06-16 DIAGNOSIS — N1831 Chronic kidney disease, stage 3a: Secondary | ICD-10-CM

## 2019-06-16 DIAGNOSIS — I208 Other forms of angina pectoris: Secondary | ICD-10-CM

## 2019-06-16 MED ORDER — ISOSORBIDE MONONITRATE ER 60 MG PO TB24: 90.0000 mg | ORAL_TABLET | Freq: Every day | ORAL | 11 refills | Status: DC

## 2019-06-16 NOTE — Progress Notes (Signed)
Cardiology Office Note:    Date:  06/16/2019   ID:  [name], DOB [birth year], MRN [id number]  PCP:  Brenda Ebbs, MD  Cardiologist:  Brenda Moores, MD   Electrophysiologist:  None  GI:  Dr. Oletta [name]  Referring MD: Brenda Ebbs, MD   Chief Complaint:  Follow-up (angina)    Patient Profile:    [name] is a 86 y.o. female with:   Coronary artery disease   Non-obstructive by cath in 2010  admx 09/2017 w/ CP >> med Rx due to age, CKD  Aortic stenosis   Mild -Echo 09/2017: Mean gradient 11 mmHg  Chronic kidney disease 3   Hypertension   Prior GI bleeding   LBBB  Prior CV studies: Cardiac monitor 08/2018 Sinus rhythm, sinus bradycardia, rare PVCs  Echocardiogram 09/30/2017 EF 55-60, normal wall motion, grade 2 diastolic dysfunction, mild aortic stenosis (mean 11), MAC, moderate LAE, mild RAE, mild TR, PASP 33  Cardiac catheterization 01/08/2009 EF 55-60 LAD proximal 15-20, mid 30-40; D2 ostial 20 LCx proximal 20-25, distal 40-50 RCA proximal 20-25, mid 50-55, distal 20-25  History of Present Illness:    [name] was last seen by [name] in July 2020 via telemedicine.  Her anginal symptoms were well controlled at that time.  She returns for follow-up.   She is here alone.  Overall, she has been doing well.  She has occasional palpitations which seem to be related to PVCs.  She has chronic shortness of breath with exertion.  This seems to be stable.  She has not had significant chest pain.  She has not had syncope, orthopnea.  She has some leg swelling.  This improves with elevation.  She did go the emergency room last month with paresthesias.  Cervical MRI did demonstrate multilevel degenerative changes.  Past Medical History:  Diagnosis Date  . Acid reflux    takes Nexium daily  . Anemia    takes Ferrous Sulfate daily  . Aortic stenosis    mild AS pk grad 18, mean grad 9, AVA 1.32 cm 03/2015 echo (Dr. Einar Gip)  . Arthritis   . Chronic kidney  disease (CKD), stage III (moderate)   . Diverticulitis   . Hematochezia 03/2015  . High cholesterol    can't take the meds  . History of blood transfusion 03/2015   no abnormal reaction noted  . History of GI diverticular bleed   . HTN (hypertension)    takes Losartan-HCTZ and Metoprolol daily  . Joint pain   . Joint swelling   . Nocturia   . Numbness    in legs  . Peripheral edema    occasionally  . Shortness of breath dyspnea    occasionally and with exertion  . Slow urinary stream    at times    Current Medications: Current Meds  Medication Sig  . acetaminophen (TYLENOL) 650 MG CR tablet Take 650 mg by mouth daily.  . Cholecalciferol (VITAMIN D3) 2000 units TABS Take 2,000 Units by mouth daily.  . diclofenac sodium (VOLTAREN) 1 % GEL Apply 2 g topically 2 (two) times daily as needed (pain).   . Ferrous Sulfate 27 MG TABS Take 1 tablet by mouth daily.  . hydrochlorothiazide (MICROZIDE) 12.5 MG capsule Take 12.5 mg by mouth daily.  . metoprolol tartrate (LOPRESSOR) 25 MG tablet Take 12.5 mg by mouth 2 (two) times daily.   . Multiple Vitamin (MULITIVITAMIN WITH MINERALS) TABS Take 1 tablet by mouth daily.  . nitroGLYCERIN (NITROSTAT) 0.4  MG SL tablet Place 0.4 mg under the tongue every 5 (five) minutes as needed for chest pain.   . polyvinyl alcohol (LIQUIFILM TEARS) 1.4 % ophthalmic solution Place 1 drop into both eyes as needed for dry eyes.  Marland Kitchen PROAIR HFA 108 (90 Base) MCG/ACT inhaler Inhale 1-2 puffs into the lungs every 4 (four) hours as needed for shortness of breath or wheezing.  . [DISCONTINUED] isosorbide mononitrate (IMDUR) 60 MG 24 hr tablet Take 1 tablet (60 mg total) by mouth daily.     Allergies:   Ezetimibe, Lipitor [atorvastatin], Pravachol [pravastatin], Statins, Zocor [simvastatin], and Cholestyramine   Social History   Tobacco Use  . Smoking status: Former Research scientist (life sciences)  . Smokeless tobacco: Never Used  . Tobacco comment: quit smoking 8+ yrs ago  Substance Use  Topics  . Alcohol use: No    Comment: quit yrs ago  . Drug use: No     Family Hx: The patient's family history includes Cancer in an other family member; Diabetes in an other family member; Heart disease in an other family member; Hypertension in an other family member.  ROS   EKGs/Labs/Other Test Reviewed:    EKG:  EKG is not ordered today.  The ekg ordered today demonstrates n/a EKG obtained 05/22/2019 in the emergency room was personally reviewed and interpreted.  This demonstrated sinus bradycardia, heart rate 57, left bundle branch block  Recent Labs: 07/22/2018: TSH 1.280 05/21/2019: ALT 12; BUN 21; Creatinine, Ser 1.17; Hemoglobin 11.2; Platelets 237; Potassium 4.1; Sodium 139   Recent Lipid Panel   Physical Exam:    VS:  BP (!) 140/56   Pulse 65   Ht _0  (1.6 m)   Wt 203 lb (92.1 kg)   SpO2 98%   BMI 35.96 kg/m     Wt Readings from Last 3 Encounters:  06/16/19 203 lb (92.1 kg)  08/29/18 199 lb (90.3 kg)  07/22/18 195 lb 6.4 oz (88.6 kg)     Constitutional:      Appearance: Healthy appearance. Not in distress.  Neck:     Thyroid: No thyromegaly.  Pulmonary:     Breath sounds: No wheezing. No rales.  Cardiovascular:     Normal rate. Regular rhythm. Normal S1. Normal S2.     Murmurs: There is a grade 2/6 crescendo-decrescendo systolic murmur at the URSB.  Edema:    Peripheral edema absent.  Abdominal:     Palpations: Abdomen is soft.  Musculoskeletal:     Cervical back: Neck supple. Skin:    General: Skin is warm and dry.  Neurological:     General: No focal deficit present.     Mental Status: Alert and oriented to person, place and time.      ASSESSMENT & PLAN:    1. Stable angina ([place]) She has a history of nonobstructive CAD by cardiac catheterization in 2010.  She had an admission in 2019 with chest discomfort.  Cardiac catheterization is being avoided given her advanced age and chronic kidney disease.  She has been fairly stable on medical therapy  for angina.  Her blood pressure is above target.  Therefore, I recommended increasing her isosorbide from 60 to 90 mg daily.  Continue current dose of metoprolol tartrate.  She is intol of statins, ezetimibe.  She is not on long term ASA therapy due to hx of GI bleeding.  She has recently noted more findings in her stool concerning for blood and plans to follow up with her gastroenterologist.  2. Stage 3a chronic kidney disease Most recent creatinine stable.  3. Essential hypertension As noted, blood pressure above target.  Adjust isosorbide as outlined above.  4. Nonrheumatic aortic valve stenosis Mild by echocardiogram in 2019.  No symptoms to suggest significant worsening.   Dispo:  Return in about 6 months (around 12/17/2019) for Routine Follow Up, w/ [name], in person.   Medication Adjustments/Labs and Tests Ordered: Current medicines are reviewed at length with the patient today.  Concerns regarding medicines are outlined above.  Tests Ordered: No orders of the defined types were placed in this encounter.  Medication Changes: Meds ordered this encounter  Medications  . isosorbide mononitrate (IMDUR) 60 MG 24 hr tablet    Sig: Take 1.5 tablets (90 mg total) by mouth daily.    Dispense:  45 tablet    Refill:  [name], [place], [place]  06/16/2019 2:55 PM    [facility] [place], [place], [place]  [address] Phone: [phone]; Fax: [phone]

## 2019-06-16 NOTE — Patient Instructions (Signed)
Medication Instructions:  1.  INCREASE the Isorsibide Monotrate (Imdur) to 60 mg taking 1 1/2 tablet daily  *If you need a refill on your cardiac medications before your next appointment, please call your pharmacy*   Lab Work: None ordered  If you have labs (blood work) drawn today and your tests are completely normal, you will receive your results only by: Marland Kitchen MyChart Message (if you have MyChart) OR . A paper copy in the mail If you have any lab test that is abnormal or we need to change your treatment, we will call you to review the results.   Testing/Procedures: None ordered   Follow-Up: At [facility], you and your health needs are our priority.  As part of our continuing mission to provide you with exceptional heart care, we have created designated Provider Care Teams.  These Care Teams include your primary Cardiologist (physician) and Advanced Practice Providers (APPs -  Physician Assistants and Nurse Practitioners) who all work together to provide you with the care you need, when you need it.  We recommend signing up for the patient portal called "MyChart".  Sign up information is provided on this After Visit Summary.  MyChart is used to connect with patients for Virtual Visits (Telemedicine).  Patients are able to view lab/test results, encounter notes, upcoming appointments, etc.  Non-urgent messages can be sent to your provider as well.   To learn more about what you can do with MyChart, go to [email or web address].    Your next appointment:   6 month(s)  The format for your next appointment:   In Person  Provider:   You may see Kristeen Miss, MD or one of the following Advanced Practice Providers on your designated Care Team:    Tereso Newcomer, PA-C  Chelsea Aus, [place]    Other Instructions

## 2019-07-16 IMAGING — DX DG CHEST 2V
2 series · 2 of 2 positions shown · non-contrast
Comparison: 11/27/2016 and prior radiographs.  11/15/2012 chest CT

CLINICAL DATA: Acute chest pain for 2 days.

EXAM:
CHEST - 2 VIEW

[chest lat]
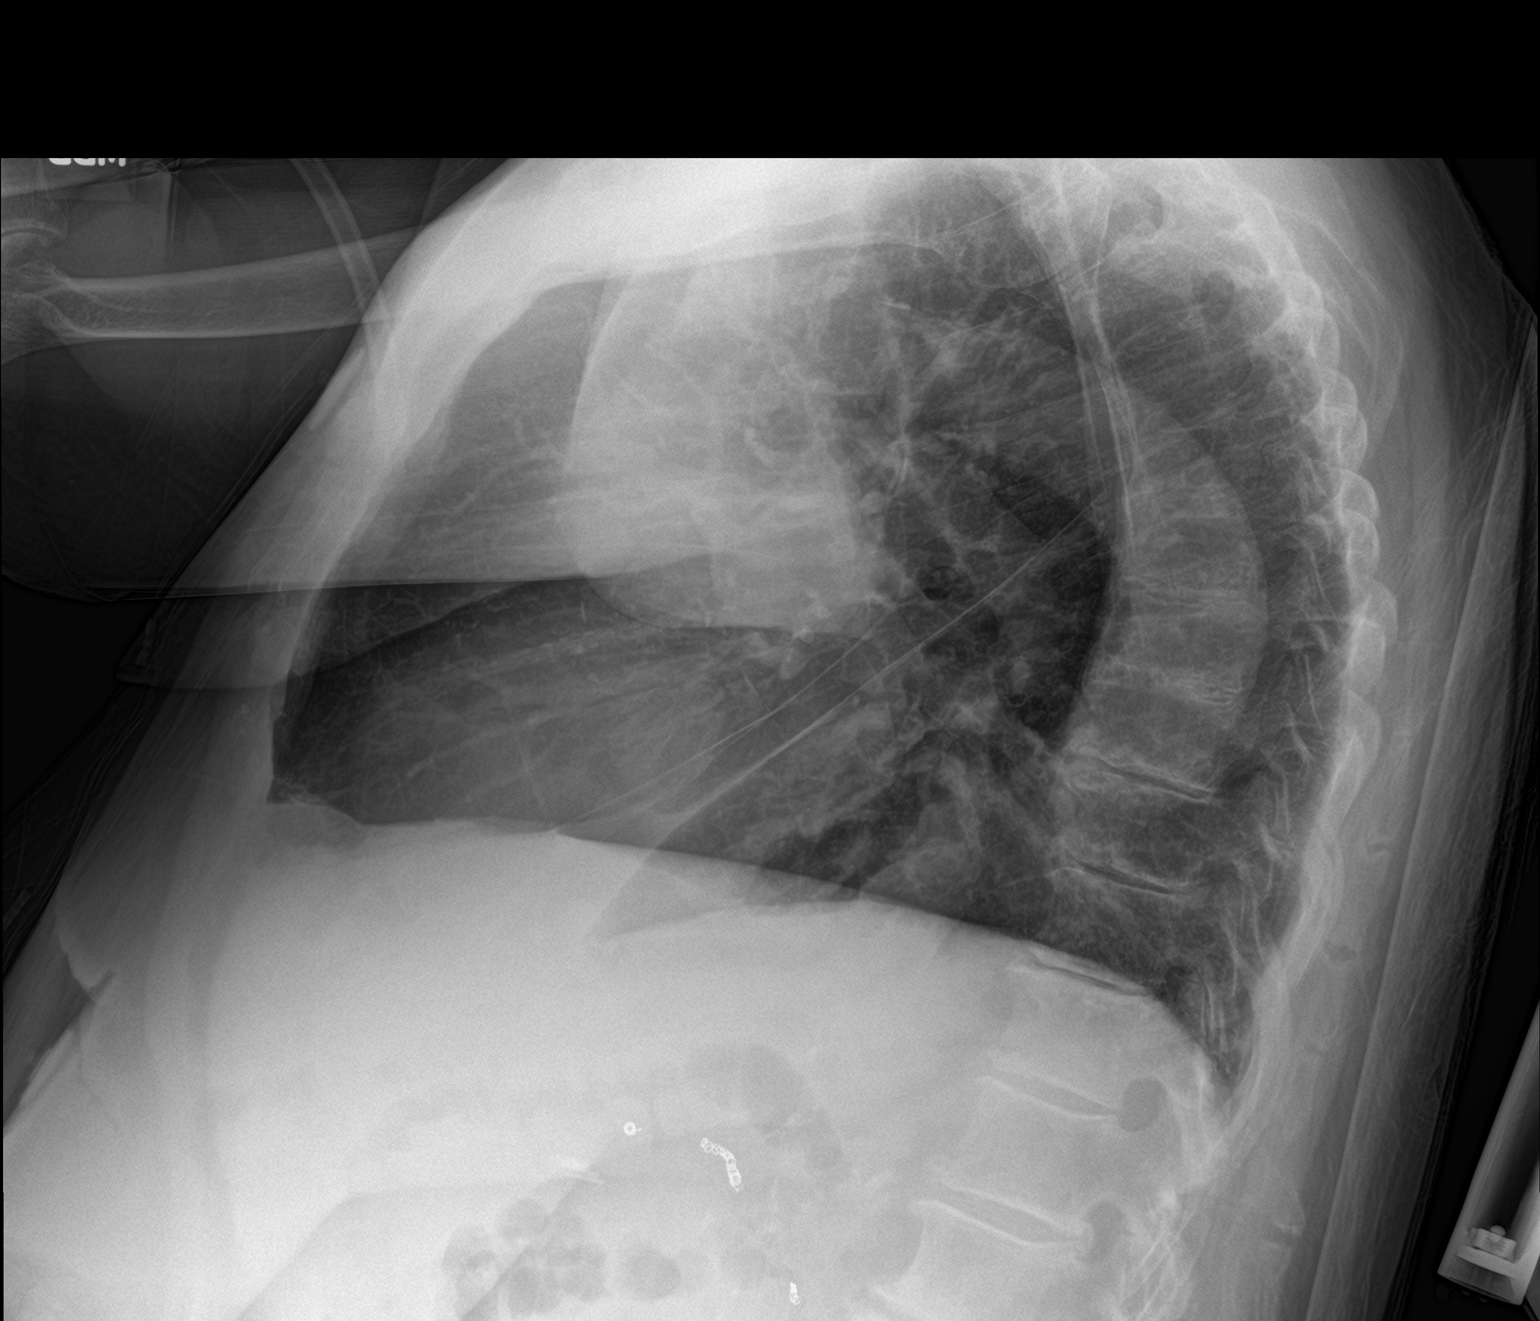

[chest ap]
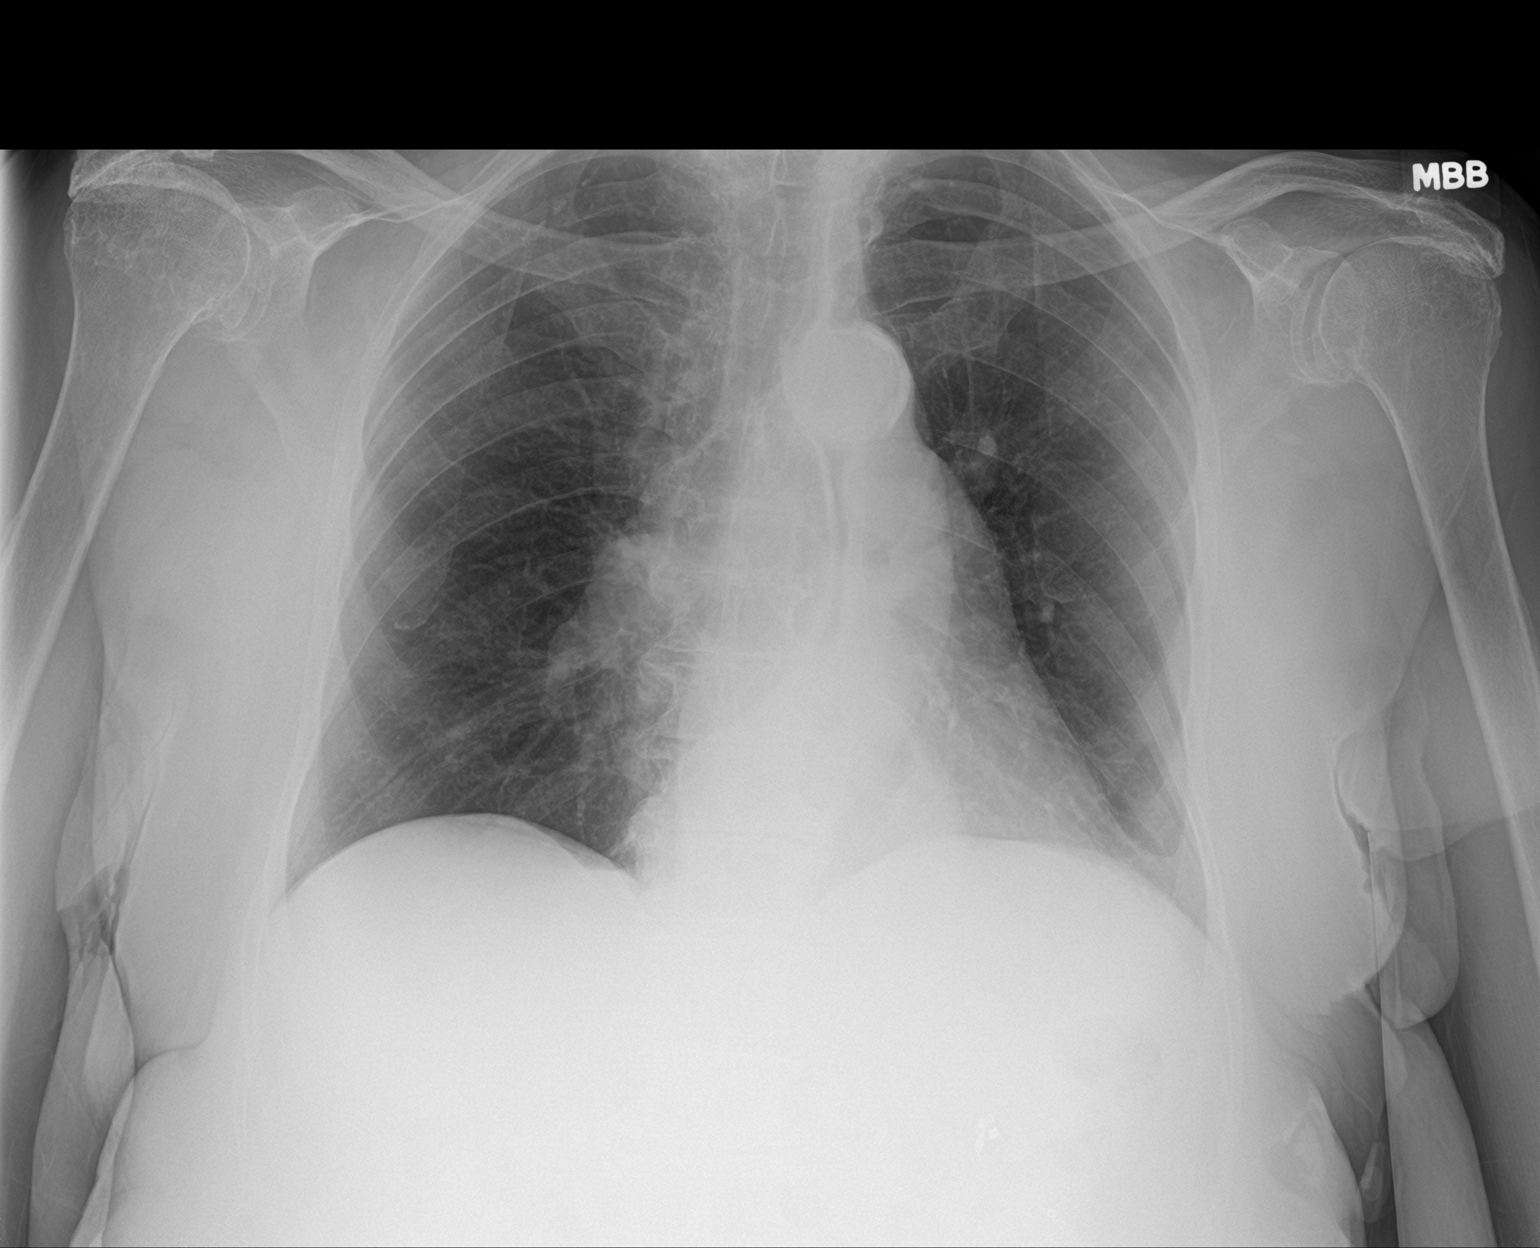

[2 of 2 positions shown; findings below may reference images not displayed]

FINDINGS: The cardiomediastinal silhouette is unchanged with moderate to large
hiatal hernia again noted.

There is no evidence of focal airspace disease, pulmonary edema,
suspicious pulmonary nodule/mass, pleural effusion, or pneumothorax.

No acute bony abnormalities are identified.
IMPRESSION: No evidence of acute abnormality.

Moderate to large hiatal hernia.

## 2019-08-20 ENCOUNTER — Encounter (HOSPITAL_COMMUNITY): Payer: Self-pay | Admitting: Emergency Medicine

## 2019-08-20 ENCOUNTER — Emergency Department (HOSPITAL_COMMUNITY)
Admission: EM | Admit: 2019-08-20 | Discharge: 2019-08-21 | Disposition: A | Discharge status: Home / Self Care | Payer: 59 | Attending: Emergency Medicine | Admitting: Emergency Medicine

## 2019-08-20 ENCOUNTER — Other Ambulatory Visit: Payer: Self-pay

## 2019-08-20 DIAGNOSIS — Z79899 Other long term (current) drug therapy: Secondary | ICD-10-CM | POA: Insufficient documentation

## 2019-08-20 DIAGNOSIS — K922 Gastrointestinal hemorrhage, unspecified: Secondary | ICD-10-CM | POA: Diagnosis not present

## 2019-08-20 DIAGNOSIS — K921 Melena: Secondary | ICD-10-CM | POA: Insufficient documentation

## 2019-08-20 DIAGNOSIS — R195 Other fecal abnormalities: Secondary | ICD-10-CM | POA: Diagnosis present

## 2019-08-20 LAB — CBC
HCT: 33.6 % — ABNORMAL LOW (ref 36.0–46.0)
Hemoglobin: 10.6 g/dL — ABNORMAL LOW (ref 12.0–15.0)
MCH: 30.3 pg (ref 26.0–34.0)
MCHC: 31.5 g/dL (ref 30.0–36.0)
MCV: 96 fL (ref 80.0–100.0)
Platelets: 228 10*3/uL (ref 150–400)
RBC: 3.5 MIL/uL — ABNORMAL LOW (ref 3.87–5.11)
RDW: 14.6 % (ref 11.5–15.5)
WBC: 3.9 10*3/uL — ABNORMAL LOW (ref 4.0–10.5)
nRBC: 0 % (ref 0.0–0.2)

## 2019-08-20 LAB — COMPREHENSIVE METABOLIC PANEL
ALT: 14 U/L (ref 0–44)
AST: 21 U/L (ref 15–41)
Albumin: 3.6 g/dL (ref 3.5–5.0)
Alkaline Phosphatase: 44 U/L (ref 38–126)
Anion gap: 7 (ref 5–15)
BUN: 23 mg/dL (ref 8–23)
CO2: 26 mmol/L (ref 22–32)
Calcium: 10.1 mg/dL (ref 8.9–10.3)
Chloride: 109 mmol/L (ref 98–111)
Creatinine, Ser: 1.47 mg/dL — ABNORMAL HIGH (ref 0.44–1.00)
GFR calc Af Amer: 35 mL/min — ABNORMAL LOW (ref >60)
GFR calc non Af Amer: 30 mL/min — ABNORMAL LOW (ref >60)
Glucose, Bld: 104 mg/dL — ABNORMAL HIGH (ref 70–99)
Potassium: 5 mmol/L (ref 3.5–5.1)
Sodium: 142 mmol/L (ref 135–145)
Total Bilirubin: 0.9 mg/dL (ref 0.3–1.2)
Total Protein: 6.6 g/dL (ref 6.5–8.1)

## 2019-08-20 LAB — TYPE AND SCREEN
ABO/RH(D): B POS
Antibody Screen: NEGATIVE

## 2019-08-20 NOTE — ED Triage Notes (Signed)
Pt states she has a log hemoglobin sent here by PCP to more evaluation.

## 2019-08-20 NOTE — ED Notes (Signed)
No answer for VS x1 

## 2019-08-21 DIAGNOSIS — K922 Gastrointestinal hemorrhage, unspecified: Secondary | ICD-10-CM | POA: Diagnosis not present

## 2019-08-21 LAB — URINALYSIS, ROUTINE W REFLEX MICROSCOPIC
Bilirubin Urine: NEGATIVE
Glucose, UA: NEGATIVE mg/dL
Hgb urine dipstick: NEGATIVE
Ketones, ur: NEGATIVE mg/dL
Leukocytes,Ua: NEGATIVE
Nitrite: NEGATIVE
Protein, ur: NEGATIVE mg/dL
Specific Gravity, Urine: 1.011 (ref 1.005–1.030)
pH: 7 (ref 5.0–8.0)

## 2019-08-21 NOTE — ED Notes (Signed)
Pt discharged to lobby to wait for ride. Pt states wishes to call ride at a later time. Refuses attempts to call a cab for her.

## 2019-08-21 NOTE — ED Provider Notes (Signed)
Emergency Department Provider Note  I have reviewed the triage vital signs and the nursing notes.  HISTORY  Chief Complaint Abnormal Lab   HPI [name] is a 86 y.o. female who presents to the ED from PCP office for eval for blood in her stool. Some dark stools (on iron) but recently had some bright red blood in stool and in the bowl.  PCP concerned hemoglobin might be low based on office testing sent here for further evaluation.  No lightheadedness or weakness.  Patient also relays some "foamy "urine.  She also had some intermittent chest pain but is not new for her.   No other associated or modifying symptoms.    Past Medical History:  Diagnosis Date  . Acid reflux    takes Nexium daily  . Anemia    takes Ferrous Sulfate daily  . Aortic stenosis    mild AS pk grad 18, mean grad 9, AVA 1.32 cm 03/2015 echo (Dr. Jacinto Halim)  . Arthritis   . Chronic kidney disease (CKD), stage III (moderate)   . Diverticulitis   . Hematochezia 03/2015  . High cholesterol    can't take the meds  . History of blood transfusion 03/2015   no abnormal reaction noted  . History of GI diverticular bleed   . HTN (hypertension)    takes Losartan-HCTZ and Metoprolol daily  . Joint pain   . Joint swelling   . Nocturia   . Numbness    in legs  . Peripheral edema    occasionally  . Shortness of breath dyspnea    occasionally and with exertion  . Slow urinary stream    at times    Patient Active Problem List   Diagnosis Date Noted  . CKD (chronic kidney disease) stage 3, GFR 30-59 ml/min 07/21/2018  . Stable angina ([facility]) 01/21/2018  . Bradycardia 10/23/2017  . Chest pain 09/29/2017  . Acute lower GI bleeding 03/06/2017  . Diverticula, colon 03/06/2017  . HTN (hypertension) 03/06/2017  . Acute kidney injury ([facility]) 03/06/2017  . Acute blood loss anemia 03/06/2017  . Hiatal hernia 11/28/2016  . Chest pain, rule out acute myocardial infarction 11/27/2016  . Hyperlipidemia 05/24/2015  .  Aortic stenosis 05/24/2015  . GERD without esophagitis 05/23/2015  . Postoperative anemia due to acute blood loss 05/23/2015  . Osteoarthritis of left hip 05/17/2015  . 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 GI bleed 02/21/2015  . Acute bronchitis 11/16/2012  . Bright red blood per rectum 11/13/2012  . Lower GI bleed 02/23/2012  . History of GI diverticular bleed 02/23/2012  . Diverticulosis 02/23/2012    Past Surgical History:  Procedure Laterality Date  . ABCESS DRAINAGE     abdominal abcess  . cataract surgery Bilateral   . CHOLECYSTECTOMY    . DILATION AND CURETTAGE OF UTERUS    . ESOPHAGOGASTRODUODENOSCOPY N/A 02/23/2015   Procedure: ESOPHAGOGASTRODUODENOSCOPY (EGD);  Surgeon: Carman Ching, MD;  Location[facility] ENDOSCOPY;  Service: Endoscopy;  Laterality: N/A;  . FLEXIBLE SIGMOIDOSCOPY N/A 02/24/2015   Procedure: FLEXIBLE SIGMOIDOSCOPY;  Surgeon: Carman Ching, MD;  Location: [facility] ENDOSCOPY;  Service: Endoscopy;  Laterality: N/A;  . IR ANGIOGRAM SELECTIVE EACH ADDITIONAL VESSEL  03/06/2017  . IR ANGIOGRAM SELECTIVE EACH ADDITIONAL VESSEL  03/06/2017  . IR ANGIOGRAM SELECTIVE EACH ADDITIONAL VESSEL  03/06/2017  . IR ANGIOGRAM SELECTIVE EACH ADDITIONAL VESSEL  03/08/2017  . IR ANGIOGRAM VISCERAL SELECTIVE  03/06/2017  . IR ANGIOGRAM VISCERAL SELECTIVE  03/06/2017  .  IR ANGIOGRAM VISCERAL SELECTIVE  03/06/2017  . IR ANGIOGRAM VISCERAL SELECTIVE  03/08/2017  . IR ANGIOGRAM VISCERAL SELECTIVE  03/08/2017  . IR EMBO ART  VEN HEMORR LYMPH EXTRAV  INC GUIDE ROADMAPPING  03/06/2017  . IR EMBO ART  VEN HEMORR LYMPH EXTRAV  INC GUIDE ROADMAPPING  03/08/2017  . IR US GUIDE VASC ACCESS RIGHT  03/06/2017  . IR US GUIDE VASC ACCESS RIGHT  03/08/2017  . left knee surgery    . TOTAL HIP ARTHROPLASTY Left 05/17/2015   Procedure: LEFT TOTAL HIP ARTHROPLASTY ANTERIOR APPROACH;  Surgeon: Kathryne Hitch, MD;  Location: [facility] OR;  Service: Orthopedics;  Laterality: Left;    Current  Outpatient Rx  . Order #: [id number] Class: Historical Med  . Order #: [id number] Class: Historical Med  . Order #: [id number] Class: Historical Med  . Order #: [id number] Class: Historical Med  . Order #: [id number] Class: Historical Med  . Order #: [id number] Class: Normal  . Order #: [id number] Class: Historical Med  . Order #: [id number] Class: Historical Med  . Order #: [id number] Class: Historical Med  . Order #: [id number] Class: Historical Med  . Order #: [id number] Class: Historical Med  . Order #: [id number] Class: Historical Med    Allergies Ezetimibe, Lipitor [atorvastatin], Pravachol [pravastatin], Statins, Zocor [simvastatin], and Cholestyramine  Family History  Problem Relation Age of Onset  . Diabetes Other   . Hypertension Other   . Cancer Other   . Heart disease Other     Social History Social History   Tobacco Use  . Smoking status: Former Games developer  . Smokeless tobacco: Never Used  . Tobacco comment: quit smoking 8+ yrs ago  Vaping Use  . Vaping Use: Never used  Substance Use Topics  . Alcohol use: No    Comment: quit yrs ago  . Drug use: No    Review of Systems  All other systems negative except as documented in the HPI. All pertinent positives and negatives as reviewed in the HPI. ____________________________________________  PHYSICAL EXAM:  VITAL SIGNS: ED Triage Vitals  Enc Vitals Group     BP 08/20/19 1650 (!) 171/72     Pulse Rate 08/20/19 1650 73     Resp 08/20/19 1650 14     Temp 08/20/19 1650 98.6 F (37 C)     Temp Source 08/20/19 1650 Oral     SpO2 08/20/19 1650 98 %     Weight --      Height --      Head Circumference --      Peak Flow --      Pain Score 08/20/19 1648 0     Pain Loc --      Pain Edu? --      Excl. in GC? --     Constitutional: Alert and oriented. Well appearing and in no acute distress. Eyes: Conjunctivae are normal. PERRL. EOMI. Head: Atraumatic. Nose: No congestion/rhinnorhea. Mouth/Throat: Mucous membranes are moist.   Oropharynx non-erythematous. Neck: No stridor.  No meningeal signs.   Cardiovascular: Normal rate, regular rhythm. Good peripheral circulation. Grossly normal heart sounds.   Respiratory: Normal respiratory effort.  No retractions. Lungs CTAB. Gastrointestinal: Soft and nontender. No distention.  Musculoskeletal: No lower extremity tenderness nor edema. No gross deformities of extremities. Neurologic:  Normal speech and language. No gross focal neurologic deficits are appreciated.  Skin:  Skin is warm, dry and intact. No rash noted.  ____________________________________________   LABS (all labs ordered are listed, but only abnormal results are displayed)  Labs Reviewed  COMPREHENSIVE  METABOLIC PANEL - Abnormal; Notable for the following components:      Result Value   Glucose, Bld 104 (*)    Creatinine, Ser 1.47 (*)    GFR calc non Af Amer 30 (*)    GFR calc Af Amer 35 (*)    All other components within normal limits  CBC - Abnormal; Notable for the following components:   WBC 3.9 (*)    RBC 3.50 (*)    Hemoglobin 10.6 (*)    HCT 33.6 (*)    All other components within normal limits  URINALYSIS, ROUTINE W REFLEX MICROSCOPIC - Abnormal; Notable for the following components:   Color, Urine STRAW (*)    All other components within normal limits  TYPE AND SCREEN   ____________________________________________  EKG   EKG Interpretation  Date/Time:  Thursday August 20 2019 16:56:34 EDT Ventricular Rate:  70 PR Interval:  306 QRS Duration: 140 QT Interval:  414 QTC Calculation: 447 R Axis:   69 Text Interpretation: Sinus rhythm with 1st degree A-V block Left bundle branch block Abnormal ECG Confirmed by Gerhard Munch [phone]) on 08/20/2019 5:01:27 PM       ____________________________________________  RADIOLOGY  No results found. ____________________________________________  PROCEDURES  Procedure(s) performed:    Procedures ____________________________________________  INITIAL IMPRESSION / ASSESSMENT AND PLAN / ED COURSE   This patient presents to the ED for concern of hematochezia, this involves an extensive number of treatment options, and is a complaint that carries with it a high risk of complications and morbidity.  The differential diagnosis includes GI bleed, iron use.      Lab Tests:   I Ordered, reviewed, and interpreted labs, which included cbc showing hemoglobin of 10.6,  CMP that was unremarkable and UA. ECG unremarkable.  Medicines ordered:   None indicated  Imaging Studies ordered:   None indicated  Additional history obtained:   Additional history obtained from noone  Previous records obtained and reviewed in epic  Consultations Obtained:   I consulted noone  and discussed lab and imaging findings  Reevaluation:  After the interventions stated above, I reevaluated the patient and found stable VS> no change. Stable for dc and continued workup as outpatient.   A medical screening exam was performed and I feel the patient has had an appropriate workup for their chief complaint at this time and likelihood of emergent condition existing is low. They have been counseled on decision, discharge, follow up and which symptoms necessitate immediate return to the emergency department. They or their family verbally stated understanding and agreement with plan and discharged in stable condition.   ____________________________________________  FINAL CLINICAL IMPRESSION(S) / ED DIAGNOSES  Final diagnoses:  Gastrointestinal hemorrhage with melena    MEDICATIONS GIVEN DURING THIS VISIT:  Medications - No data to display  NEW OUTPATIENT MEDICATIONS STARTED DURING THIS VISIT:  Discharge Medication List as of 08/21/2019  3:10 AM      Note:  This note was prepared with assistance of Dragon voice recognition software. Occasional wrong-word or sound-a-like substitutions  may have occurred due to the inherent limitations of voice recognition software.   Chaun Uemura, Barbara Cower, MD 08/21/19 [phone]

## 2019-09-17 ENCOUNTER — Inpatient Hospital Stay (HOSPITAL_COMMUNITY): Payer: 59

## 2019-09-17 ENCOUNTER — Emergency Department (HOSPITAL_COMMUNITY): Payer: 59

## 2019-09-17 ENCOUNTER — Other Ambulatory Visit: Payer: Self-pay

## 2019-09-17 ENCOUNTER — Inpatient Hospital Stay (HOSPITAL_COMMUNITY)
Admission: EM | Admit: 2019-09-17 | Discharge: 2019-09-21 | DRG: 056 | Disposition: A | Discharge status: Home / Self Care | Payer: 59 | Attending: Family Medicine | Admitting: Family Medicine

## 2019-09-17 ENCOUNTER — Encounter (HOSPITAL_COMMUNITY): Payer: Self-pay | Admitting: Emergency Medicine

## 2019-09-17 DIAGNOSIS — H9190 Unspecified hearing loss, unspecified ear: Secondary | ICD-10-CM | POA: Diagnosis present

## 2019-09-17 DIAGNOSIS — Z79899 Other long term (current) drug therapy: Secondary | ICD-10-CM

## 2019-09-17 DIAGNOSIS — Z87891 Personal history of nicotine dependence: Secondary | ICD-10-CM

## 2019-09-17 DIAGNOSIS — K5790 Diverticulosis of intestine, part unspecified, without perforation or abscess without bleeding: Secondary | ICD-10-CM | POA: Diagnosis not present

## 2019-09-17 DIAGNOSIS — M4802 Spinal stenosis, cervical region: Secondary | ICD-10-CM | POA: Diagnosis present

## 2019-09-17 DIAGNOSIS — K219 Gastro-esophageal reflux disease without esophagitis: Secondary | ICD-10-CM | POA: Diagnosis present

## 2019-09-17 DIAGNOSIS — Z8249 Family history of ischemic heart disease and other diseases of the circulatory system: Secondary | ICD-10-CM | POA: Diagnosis not present

## 2019-09-17 DIAGNOSIS — I350 Nonrheumatic aortic (valve) stenosis: Secondary | ICD-10-CM | POA: Diagnosis present

## 2019-09-17 DIAGNOSIS — Z8673 Personal history of transient ischemic attack (TIA), and cerebral infarction without residual deficits: Secondary | ICD-10-CM | POA: Diagnosis not present

## 2019-09-17 DIAGNOSIS — G8194 Hemiplegia, unspecified affecting left nondominant side: Principal | ICD-10-CM | POA: Diagnosis present

## 2019-09-17 DIAGNOSIS — E7800 Pure hypercholesterolemia, unspecified: Secondary | ICD-10-CM | POA: Diagnosis present

## 2019-09-17 DIAGNOSIS — E785 Hyperlipidemia, unspecified: Secondary | ICD-10-CM | POA: Diagnosis present

## 2019-09-17 DIAGNOSIS — I10 Essential (primary) hypertension: Secondary | ICD-10-CM | POA: Diagnosis present

## 2019-09-17 DIAGNOSIS — Z20822 Contact with and (suspected) exposure to covid-19: Secondary | ICD-10-CM | POA: Diagnosis present

## 2019-09-17 DIAGNOSIS — N1832 Chronic kidney disease, stage 3b: Secondary | ICD-10-CM | POA: Diagnosis present

## 2019-09-17 DIAGNOSIS — N1831 Chronic kidney disease, stage 3a: Secondary | ICD-10-CM | POA: Diagnosis not present

## 2019-09-17 DIAGNOSIS — Z66 Do not resuscitate: Secondary | ICD-10-CM | POA: Diagnosis not present

## 2019-09-17 DIAGNOSIS — M5030 Other cervical disc degeneration, unspecified cervical region: Secondary | ICD-10-CM | POA: Diagnosis present

## 2019-09-17 DIAGNOSIS — R200 Anesthesia of skin: Secondary | ICD-10-CM

## 2019-09-17 DIAGNOSIS — I129 Hypertensive chronic kidney disease with stage 1 through stage 4 chronic kidney disease, or unspecified chronic kidney disease: Secondary | ICD-10-CM | POA: Diagnosis present

## 2019-09-17 DIAGNOSIS — R531 Weakness: Secondary | ICD-10-CM

## 2019-09-17 DIAGNOSIS — Z833 Family history of diabetes mellitus: Secondary | ICD-10-CM

## 2019-09-17 DIAGNOSIS — I340 Nonrheumatic mitral (valve) insufficiency: Secondary | ICD-10-CM | POA: Diagnosis not present

## 2019-09-17 DIAGNOSIS — I361 Nonrheumatic tricuspid (valve) insufficiency: Secondary | ICD-10-CM | POA: Diagnosis not present

## 2019-09-17 DIAGNOSIS — K5791 Diverticulosis of intestine, part unspecified, without perforation or abscess with bleeding: Secondary | ICD-10-CM | POA: Diagnosis present

## 2019-09-17 DIAGNOSIS — N183 Chronic kidney disease, stage 3 unspecified: Secondary | ICD-10-CM | POA: Diagnosis present

## 2019-09-17 HISTORY — DX: Hemiplegia, unspecified affecting left nondominant side: G81.94

## 2019-09-17 LAB — DIFFERENTIAL
Abs Immature Granulocytes: 0.01 10*3/uL (ref 0.00–0.07)
Basophils Absolute: 0 10*3/uL (ref 0.0–0.1)
Basophils Relative: 1 %
Eosinophils Absolute: 0.1 10*3/uL (ref 0.0–0.5)
Eosinophils Relative: 4 %
Immature Granulocytes: 0 %
Lymphocytes Relative: 25 %
Lymphs Abs: 0.9 10*3/uL (ref 0.7–4.0)
Monocytes Absolute: 0.4 10*3/uL (ref 0.1–1.0)
Monocytes Relative: 11 %
Neutro Abs: 2.1 10*3/uL (ref 1.7–7.7)
Neutrophils Relative %: 59 %

## 2019-09-17 LAB — COMPREHENSIVE METABOLIC PANEL
ALT: 11 U/L (ref 0–44)
AST: 19 U/L (ref 15–41)
Albumin: 4.2 g/dL (ref 3.5–5.0)
Alkaline Phosphatase: 51 U/L (ref 38–126)
Anion gap: 9 (ref 5–15)
BUN: 19 mg/dL (ref 8–23)
CO2: 27 mmol/L (ref 22–32)
Calcium: 9.2 mg/dL (ref 8.9–10.3)
Chloride: 104 mmol/L (ref 98–111)
Creatinine, Ser: 1.36 mg/dL — ABNORMAL HIGH (ref 0.44–1.00)
GFR calc Af Amer: 39 mL/min — ABNORMAL LOW (ref >60)
GFR calc non Af Amer: 33 mL/min — ABNORMAL LOW (ref >60)
Glucose, Bld: 97 mg/dL (ref 70–99)
Potassium: 4.2 mmol/L (ref 3.5–5.1)
Sodium: 140 mmol/L (ref 135–145)
Total Bilirubin: 0.4 mg/dL (ref 0.3–1.2)
Total Protein: 7.2 g/dL (ref 6.5–8.1)

## 2019-09-17 LAB — I-STAT CHEM 8, ED
BUN: 20 mg/dL (ref 8–23)
Calcium, Ion: 1.17 mmol/L (ref 1.15–1.40)
Chloride: 104 mmol/L (ref 98–111)
Creatinine, Ser: 1.4 mg/dL — ABNORMAL HIGH (ref 0.44–1.00)
Glucose, Bld: 93 mg/dL (ref 70–99)
HCT: 36 % (ref 36.0–46.0)
Hemoglobin: 12.2 g/dL (ref 12.0–15.0)
Potassium: 4.4 mmol/L (ref 3.5–5.1)
Sodium: 142 mmol/L (ref 135–145)
TCO2: 27 mmol/L (ref 22–32)

## 2019-09-17 LAB — CBC
HCT: 36.4 % (ref 36.0–46.0)
Hemoglobin: 11.6 g/dL — ABNORMAL LOW (ref 12.0–15.0)
MCH: 31 pg (ref 26.0–34.0)
MCHC: 31.9 g/dL (ref 30.0–36.0)
MCV: 97.3 fL (ref 80.0–100.0)
Platelets: 211 10*3/uL (ref 150–400)
RBC: 3.74 MIL/uL — ABNORMAL LOW (ref 3.87–5.11)
RDW: 14.8 % (ref 11.5–15.5)
WBC: 3.5 10*3/uL — ABNORMAL LOW (ref 4.0–10.5)
nRBC: 0 % (ref 0.0–0.2)

## 2019-09-17 LAB — APTT: aPTT: 32 seconds (ref 24–36)

## 2019-09-17 LAB — PROTIME-INR
INR: 1 (ref 0.8–1.2)
Prothrombin Time: 12.3 seconds (ref 11.4–15.2)

## 2019-09-17 LAB — SARS CORONAVIRUS 2 BY RT PCR (HOSPITAL ORDER, PERFORMED IN ~~LOC~~ HOSPITAL LAB): SARS Coronavirus 2: NEGATIVE

## 2019-09-17 LAB — CBG MONITORING, ED: Glucose-Capillary: 95 mg/dL (ref 70–99)

## 2019-09-17 MED ORDER — ASPIRIN 325 MG PO TABS
325.0000 mg | ORAL_TABLET | Freq: Every day | ORAL | Status: DC
  Administered 2019-09-17: 325 mg via ORAL
  Filled 2019-09-17: qty 1

## 2019-09-17 MED ORDER — ACETAMINOPHEN 650 MG RE SUPP: 650.0000 mg | RECTAL | Status: DC | PRN

## 2019-09-17 MED ORDER — SENNOSIDES-DOCUSATE SODIUM 8.6-50 MG PO TABS: 1.0000 | ORAL_TABLET | Freq: Every evening | ORAL | Status: DC | PRN

## 2019-09-17 MED ORDER — ACETAMINOPHEN 160 MG/5ML PO SOLN: 650.0000 mg | ORAL | Status: DC | PRN

## 2019-09-17 MED ORDER — ENOXAPARIN SODIUM 30 MG/0.3ML ~~LOC~~ SOLN: 30.0000 mg | SUBCUTANEOUS | Status: DC

## 2019-09-17 MED ORDER — ASPIRIN 300 MG RE SUPP: 300.0000 mg | Freq: Every day | RECTAL | Status: DC

## 2019-09-17 MED ORDER — SODIUM CHLORIDE 0.9% FLUSH
3.0000 mL | Freq: Once | INTRAVENOUS | Status: AC
Start: 2019-09-17 — End: 2019-09-17
  Administered 2019-09-17: 3 mL via INTRAVENOUS

## 2019-09-17 MED ORDER — STROKE: EARLY STAGES OF RECOVERY BOOK
Freq: Once | Status: AC
  Filled 2019-09-17: qty 1

## 2019-09-17 MED ORDER — SODIUM CHLORIDE 0.9 % IV SOLN: INTRAVENOUS | Status: DC

## 2019-09-17 MED ORDER — FAMOTIDINE 20 MG PO TABS
20.0000 mg | ORAL_TABLET | Freq: Two times a day (BID) | ORAL | Status: DC
  Administered 2019-09-17 – 2019-09-21 (×9): 20 mg via ORAL
  Filled 2019-09-17 (×8): qty 1
  Filled 2019-09-17: qty 2
  Filled 2019-09-17 (×2): qty 1

## 2019-09-17 MED ORDER — ACETAMINOPHEN 325 MG PO TABS
650.0000 mg | ORAL_TABLET | ORAL | Status: DC | PRN
  Filled 2019-09-17: qty 2

## 2019-09-17 MED ORDER — LABETALOL HCL 5 MG/ML IV SOLN
10.0000 mg | Freq: Once | INTRAVENOUS | Status: AC
  Administered 2019-09-17: 10 mg via INTRAVENOUS
  Filled 2019-09-17: qty 4

## 2019-09-17 MED ORDER — ENOXAPARIN SODIUM 40 MG/0.4ML ~~LOC~~ SOLN
40.0000 mg | SUBCUTANEOUS | Status: DC
  Administered 2019-09-17: 40 mg via SUBCUTANEOUS
  Filled 2019-09-17: qty 0.4

## 2019-09-17 NOTE — ED Notes (Signed)
Pt provided with dinner tray. Pt comfortable at this time and eating

## 2019-09-17 NOTE — H&P (Signed)
[facility] History and Physical  [name] LZJ:[id number] DOB: 1923-05-12 DOA: 09/17/2019   PCP: System, Pcp Not In  Specialists: None  Chief Complaint: Left-sided numbness and weakness  HPI: [name] is a 86 y.o. female with a past medical history of hypertension, acid reflux, chronic kidney disease stage IIIb who was in her usual state of health till this morning when she woke up and was going about her usual business when suddenly around 730am she felt that the left side of her body was numb.  Symptoms were more so in the left arm rather than the left leg.  She also felt weakness in that arm.  Denies any difficulty speaking.  She contacted her daughter and she was brought into the emergency department.  Currently patient states that her symptoms have improved.  The left arm is no longer weak but is still numb.  Denies any difficulty speaking.  Denies any shortness of breath, nausea, vomiting.  No chest pain.  Denies any difficulty urinating.  In the emergency department concern was for acute stroke.  A code stroke was called.  Patient was seen by tele neurology.  She was not considered a candidate for TPA due to symptoms which were improving as well as recent episode of rectal bleeding.  Patient will be hospitalized for further evaluation.  Home Medications: Prior to Admission medications   Medication Sig Start Date End Date Taking? Authorizing Provider  acetaminophen (TYLENOL) 650 MG CR tablet Take 650 mg by mouth daily.   Yes [provider]  Cholecalciferol (VITAMIN D3) 2000 units TABS Take 2,000 Units by mouth daily.   Yes [provider]  diclofenac sodium (VOLTAREN) 1 % GEL Apply 2 g topically 2 (two) times daily as needed (pain).    Yes [provider]  famotidine (PEPCID) 20 MG tablet Take 20 mg by mouth daily.    Yes [provider]  Ferrous Sulfate 27 MG TABS Take 1 tablet by mouth daily.   Yes [provider]    isosorbide mononitrate (IMDUR) 60 MG 24 hr tablet Take 1.5 tablets (90 mg total) by mouth daily. 06/16/19  Yes Weaver, Scott T, PA-C  losartan (COZAAR) 25 MG tablet Take 25 mg by mouth daily.   Yes [provider]  metoprolol tartrate (LOPRESSOR) 25 MG tablet Take 12.5 mg by mouth 2 (two) times daily.    Yes [provider]  Multiple Vitamin (MULITIVITAMIN WITH MINERALS) TABS Take 1 tablet by mouth daily.   Yes [provider]  nitroGLYCERIN (NITROSTAT) 0.4 MG SL tablet Place 0.4 mg under the tongue every 5 (five) minutes as needed for chest pain.    Yes [provider]  polyvinyl alcohol (LIQUIFILM TEARS) 1.4 % ophthalmic solution Place 1 drop into both eyes as needed for dry eyes.   Yes [provider]  PROAIR HFA 108 (90 Base) MCG/ACT inhaler Inhale 1-2 puffs into the lungs every 4 (four) hours as needed for shortness of breath or wheezing. 12/04/16  Yes [provider]    Allergies:  Allergies  Allergen Reactions  . Ezetimibe Anaphylaxis  . Lipitor [Atorvastatin] Swelling  . Pravachol [Pravastatin] Swelling  . Statins Swelling and Other (See Comments)    Swelling of mouth and lips  . Zocor [Simvastatin] Swelling  . Cholestyramine Nausea And Vomiting    Past Medical History: Past Medical History:  Diagnosis Date  . Acid reflux    takes Nexium daily  . Anemia    takes  Ferrous Sulfate daily  . Aortic stenosis    mild AS pk grad 18, mean grad 9, AVA 1.32 cm 03/2015 echo (Dr. Jacinto HalimGanji)  . Arthritis   . Chronic kidney disease (CKD), stage III (moderate)   . Diverticulitis   . Hematochezia 03/2015  . High cholesterol    can't take the meds  . History of blood transfusion 03/2015   no abnormal reaction noted  . History of GI diverticular bleed   . HTN (hypertension)    takes Losartan-HCTZ and Metoprolol daily  . Joint pain   . Joint swelling   . Nocturia   . Numbness    in legs  . Peripheral edema    occasionally  . Shortness  of breath dyspnea    occasionally and with exertion  . Slow urinary stream    at times    Past Surgical History:  Procedure Laterality Date  . ABCESS DRAINAGE     abdominal abcess  . cataract surgery Bilateral   . CHOLECYSTECTOMY    . DILATION AND CURETTAGE OF UTERUS    . ESOPHAGOGASTRODUODENOSCOPY N/A 02/23/2015   Procedure: ESOPHAGOGASTRODUODENOSCOPY (EGD);  Surgeon: Carman ChingJames Edwards, MD;  [facility];  Service: Endoscopy;  Laterality: N/A;  . FLEXIBLE SIGMOIDOSCOPY N/A 02/24/2015   Procedure: FLEXIBLE SIGMOIDOSCOPY;  Surgeon: Carman ChingJames Edwards, MD;  [facility];  Service: Endoscopy;  Laterality: N/A;  . IR ANGIOGRAM SELECTIVE EACH ADDITIONAL VESSEL  03/06/2017  . IR ANGIOGRAM SELECTIVE EACH ADDITIONAL VESSEL  03/06/2017  . IR ANGIOGRAM SELECTIVE EACH ADDITIONAL VESSEL  03/06/2017  . IR ANGIOGRAM SELECTIVE EACH ADDITIONAL VESSEL  03/08/2017  . IR ANGIOGRAM VISCERAL SELECTIVE  03/06/2017  . IR ANGIOGRAM VISCERAL SELECTIVE  03/06/2017  . IR ANGIOGRAM VISCERAL SELECTIVE  03/06/2017  . IR ANGIOGRAM VISCERAL SELECTIVE  03/08/2017  . IR ANGIOGRAM VISCERAL SELECTIVE  03/08/2017  . IR EMBO ART  VEN HEMORR LYMPH EXTRAV  INC GUIDE ROADMAPPING  03/06/2017  . IR EMBO ART  VEN HEMORR LYMPH EXTRAV  INC GUIDE ROADMAPPING  03/08/2017  . IR US GUIDE VASC ACCESS RIGHT  03/06/2017  . IR US GUIDE VASC ACCESS RIGHT  03/08/2017  . left knee surgery    . TOTAL HIP ARTHROPLASTY Left 05/17/2015   Procedure: LEFT TOTAL HIP ARTHROPLASTY ANTERIOR APPROACH;  Surgeon: Kathryne Hitchhristopher Y Blackman, MD;  Location: [facility] OR;  Service: Orthopedics;  Laterality: Left;    Social History: She lives by herself.  Her daughter lives close by.  Usually independent with daily activities.  Denies any smoking alcohol use or illicit drug use.  Family History:  Family History  Problem Relation Age of Onset  . Diabetes Other   . Hypertension Other   . Cancer Other   . Heart disease Other      Review of Systems - History  obtained from the patient General ROS: positive for  - fatigue Psychological ROS: negative Ophthalmic ROS: negative ENT ROS: negative Allergy and Immunology ROS: negative Hematological and Lymphatic ROS: negative Endocrine ROS: negative Respiratory ROS: no cough, shortness of breath, or wheezing Cardiovascular ROS: no chest pain or dyspnea on exertion Gastrointestinal ROS: no abdominal pain, change in bowel habits, or black or bloody stools Genito-Urinary ROS: no dysuria, trouble voiding, or hematuria Musculoskeletal ROS: negative Neurological ROS: As in HPI Dermatological ROS: negative  Physical Examination  Vitals:   09/17/19 1100 09/17/19 1130 09/17/19 1200 09/17/19 1300  BP: (!) 170/73 (!) 173/70 (!) 160/67 (!) 180/75  Pulse: (!) 52 (!) 49 (!) 47 (!) 51  Resp: 16 14 16 17   Temp:      SpO2: 100% 100% 100% 100%  Weight:      Height:        BP (!) 180/75   Pulse (!) 51   Temp 98.2 F (36.8 C)   Resp 17   Ht 5\' 3"  (1.6 m)   Wt 92.1 kg   SpO2 100%   BMI 35.96 kg/m   General appearance: alert, cooperative, appears stated age and no distress Head: Normocephalic, without obvious abnormality, atraumatic Eyes: conjunctivae/corneas clear. PERRL, EOM's intact.  Throat: lips, mucosa, and tongue normal; teeth and gums normal Neck: no adenopathy, no carotid bruit, no JVD, supple, symmetrical, trachea midline and thyroid not enlarged, symmetric, no tenderness/mass/nodules Back: symmetric, no curvature. ROM normal. No CVA tenderness. Resp: clear to auscultation bilaterally Cardio: regular rate and rhythm, S1, S2 normal, no murmur, click, rub or gallop GI: soft, non-tender; bowel sounds normal; no masses,  no organomegaly Extremities: extremities normal, atraumatic, no cyanosis or edema Pulses: 2+ and symmetric Skin: Skin color, texture, turgor normal. No rashes or lesions Lymph nodes: Cervical, supraclavicular, and axillary nodes normal. Neurologic: Left and oriented x3.   Cranial nerves II to XII intact.  Motor strength noted to be slightly weaker on the left arm compared to the right.  No pronator drift.  Equal strength noted in bilateral lower extremities.  Gait not assessed.    Labs on Admission: I have personally reviewed following labs and imaging studies  CBC: Recent Labs  Lab 09/17/19 0853 09/17/19 0940  WBC 3.5*  --   NEUTROABS 2.1  --   HGB 11.6* 12.2  HCT 36.4 36.0  MCV 97.3  --   PLT 211  --    Basic Metabolic Panel: Recent Labs  Lab 09/17/19 0853 09/17/19 0940  NA 140 142  K 4.2 4.4  CL 104 104  CO2 27  --   GLUCOSE 97 93  BUN 19 20  CREATININE 1.36* 1.40*  CALCIUM 9.2  --    GFR: Estimated Creatinine Clearance: 27.1 mL/min (A) (by C-G formula based on SCr of 1.4 mg/dL (H)). Liver Function Tests: Recent Labs  Lab 09/17/19 0853  AST 19  ALT 11  ALKPHOS 51  BILITOT 0.4  PROT 7.2  ALBUMIN 4.2   Coagulation Profile: Recent Labs  Lab 09/17/19 0853  INR 1.0   CBG: Recent Labs  Lab 09/17/19 0939  GLUCAP 95     Radiological Exams on Admission: CT HEAD CODE STROKE WO CONTRAST  Result Date: 09/17/2019 CLINICAL DATA:  Code stroke.  Right wrist and left arm numbness. EXAM: CT HEAD WITHOUT CONTRAST TECHNIQUE: Contiguous axial images were obtained from the base of the skull through the vertex without intravenous contrast. COMPARISON:  Brain MRI 05/21/2019 FINDINGS: Brain: No evidence of acute infarction, hemorrhage, hydrocephalus, extra-axial collection or mass lesion/mass effect. Vascular: No hyperdense vessel or unexpected calcification. Skull: Normal. Negative for fracture or focal lesion. Sinuses/Orbits: Bilateral cataract resection Other: These results were called by telephone at the time of interpretation on 09/17/2019 at 9:13 am to provider ERIC KATZ , who verbally acknowledged these results. ASPECTS [facility] Stroke Program Early CT Score) not scored with this history IMPRESSION: Age normal head CT. Electronically Signed    By: 11/17/2019 M.D.   On: 09/17/2019 09:15    My interpretation of Electrocardiogram: Sinus bradycardia in the 40s.  Normal axis.  Interventricular conduction delay noted.  Similar to previous EKG.  Nonspecific T wave changes similar to  previous EKG.   Problem List  Principal Problem:   Left hemiparesis (HCC) Active Problems:   Diverticulosis   Aortic stenosis   HTN (hypertension)   CKD (chronic kidney disease) stage 3, GFR 30-59 ml/min   Assessment: This is a 86 year old African-American female with past medical history as stated earlier who comes in with left hemiparesis.  Concern is for acute stroke.  CT scan did not show any acute findings.  Symptoms have been improving.  Plan:  1. Left hemiparesis/possible acute stroke: Seen by tele neurology.  Not thought to be a candidate for TPA due to symptoms which were improving plus recent episodes of rectal bleeding.  Patient noted to have improved but still has subtle weakness in the left arm.  Stroke work-up has been initiated including MRI brain, MRA head, carotid Dopplers, echocardiogram, lipid panel, HbA1c.  PT OT.  Swallow screen.  2.  Chronic GI bleed in the setting of diverticulosis: It appears that patient has had rectal bleeding on and off.  She is known to have severe diverticulosis.  Last seen by [facility] gastroenterology few years ago and underwent an attempted colonoscopy in 2017 which could not be done due to significant diverticulosis.  Has not had any bleeding recently.  Okay to use aspirin for now.  3.  Chronic kidney disease stage IIIb: Appears to be close to baseline.    4. Essential hypertension: Allow permissive hypertension.  DVT Prophylaxis: Lovenox Code Status: CODE STATUS discussed with patient.  She wants to be DNR. Family Communication: Discussed with the patient.  No family at bedside Disposition: Hopefully return home when improved Consults called: Neurology Admission Status: Status is:  Inpatient  Remains inpatient appropriate because:Ongoing diagnostic testing needed not appropriate for outpatient work up and Inpatient level of care appropriate due to severity of illness   Dispo: The patient is from: Home              Anticipated d/c is to: To be determined              Anticipated d/c date is: 2 days              Patient currently is not medically stable to d/c.   Severity of Illness: The appropriate patient status for this patient is INPATIENT. Inpatient status is judged to be reasonable and necessary in order to provide the required intensity of service to ensure the patient's safety. The patient's presenting symptoms, physical exam findings, and initial radiographic and laboratory data in the context of their chronic comorbidities is felt to place them at high risk for further clinical deterioration. Furthermore, it is not anticipated that the patient will be medically stable for discharge from the hospital within 2 midnights of admission. The following factors support the patient status of inpatient.   " The patient's presenting symptoms include left arm numbness. " The worrisome physical exam findings include left hemiparesis. " The initial radiographic and laboratory data are worrisome because of concern for stroke. " The chronic co-morbidities include hypertension.   * I certify that at the point of admission it is my clinical judgment that the patient will require inpatient hospital care spanning beyond 2 midnights from the point of admission due to high intensity of service, high risk for further deterioration and high frequency of surveillance required.*  Further management decisions will depend on results of further testing and patient's response to treatment.   Kailyn Vanderslice [employer]  [facility] on [email or web address]  09/17/2019, 1:26 PM

## 2019-09-17 NOTE — ED Notes (Signed)
PT transported to CT>

## 2019-09-17 NOTE — ED Notes (Signed)
[facility] will probably arrive after 7pm

## 2019-09-17 NOTE — ED Notes (Addendum)
PT was stand and pivot x1 to bedside commode and back to bed after use with minimal assistance.

## 2019-09-17 NOTE — ED Notes (Signed)
Provider at bedside

## 2019-09-17 NOTE — Consult Note (Signed)
[facility] Tele[facility]urology Consult Services   Date of Service:   09/17/2019 09:13:00  Impression:     .  I63.00 - Cerebrovascular accident (CVA) due to thrombosis of precerebral artery (HCCC)  Comments/Sign-Out: 86 year old man with past medical history of HLD, hypertension, CKD stage 3, hearing loss, LGI not on blood thinners due to this - last bleeding 1 week ago and recent admission to the hosptial early July for this presenting with left sided weakness which has improved since presentation. NIHSS 2. Not candidate for thrombolytics. Poor kidney function and low suspicion for proximal LVO - not candidate for nir  Metrics: Last Known Well: 09/17/2019 08:30:00 [facility] Notification Time: 09/17/2019 09:13:00 Arrival Time: 09/17/2019 08:36:00 Stamp Time: 09/17/2019 09:13:00 Time First Login Attempt: 09/17/2019 09:19:04 Symptoms: L sided weakness. NIHSS Start Assessment Time: 09/17/2019 09:19:00 Patient is not a candidate for Thrombolytic. Thrombolytic Medical Decision: 09/17/2019 09:32:20 Patient was not deemed candidate for Thrombolytic because of following reasons: hx of LGI bleeding 1week ago.  CT head showed no acute hemorrhage or acute core infarct.  ED Physician notified of diagnostic impression and management plan on 09/17/2019 09:39:22  Advanced Imaging: Advanced Imaging Not Recommended because:  Poor Renal Function and Low Clinical Suspicion of LVO   Alteplase/Activase Contraindications:  Our recommendations are outlined below.  Recommendations:     .  Activate Stroke Protocol Admission/Order Set     .  Stroke/Telemetry Floor     .  Neuro Checks     .  Bedside Swallow Eval     .  DVT Prophylaxis     .  IV Fluids, Normal Saline     .  Head of Bed 30 Degrees     .  Euglycemia and Avoid Hyperthermia (PRN Acetaminophen)     .  clarify if can take asa - consider GI consult for recent LGI bleeding  Routine Consultation with Inhouse  Neurology for Follow up Care  Sign Out:     .  Discussed with Emergency Department Provider    ------------------------------------------------------------------------------  History of Present Illness: Patient is a 86 year old Female.  Patient was brought by private transportation with symptoms of L sided weakness.  86 year old man with past medical history of HLD, hypertension, CKD stage 3, hearing loss, LGI not on blood thinners due to this - last bleeding 1 week ago and recent admission to the hosptial early July for this presenting with left sided weakness which has improved since presentation.  Last seen normal was within 4.5 hours. There is history of hemorrhagic complications or intracranial hemorrhage.  Past Medical History:     . Hypertension     . Hyperlipidemia     . htn, hld  Social History: Smoking: No Alcohol Use: No Drug Use: No  Family History:non contributory  Review of System:  14 Points Review of Systems was performed and was negative except mentioned in HPI.  Anticoagulant use:  No  Antiplatelet use: No     Examination: BP(185/120), Pulse(54), Blood Glucose(pending) 1A: Level of Consciousness - Alert; keenly responsive + 0 1B: Ask Month and Age - Both Questions Right + 0 1C: Blink Eyes & Squeeze Hands - Performs Both Tasks + 0 2: Test Horizontal Extraocular Movements - Normal + 0 3: Test Visual Fields - No Visual Loss + 0 4: Test Facial Palsy (Use Grimace if Obtunded) - Normal symmetry + 0 5A: Test Left Arm Motor Drift - No Drift for 10 Seconds + 0 5B: Test Right  Arm Motor Drift - No Drift for 10 Seconds + 0 6A: Test Left Leg Motor Drift - Drift, but doesn't hit bed + 1 6B: Test Right Leg Motor Drift - No Drift for 5 Seconds + 0 7: Test Limb Ataxia (FNF/Heel-Shin) - No Ataxia + 0 8: Test Sensation - Mild-Moderate Loss: Less Sharp/More Dull + 1 9: Test Language/Aphasia - Normal; No aphasia + 0 10: Test Dysarthria - Normal + 0 11: Test  Extinction/Inattention - No abnormality + 0  NIHSS Score: 2  Pre-Morbid Modified Rankin Scale: 3 Points = Moderate disability; requiring some help, but able to walk without assistance   Patient/Family was informed the Neurology Consult would occur via TeleHealth consult by way of interactive audio and video telecommunications and consented to receiving care in this manner.   Patient is being evaluated for possible acute neurologic impairment and high probability of imminent or life-threatening deterioration. I spent total of 22 minutes providing care to this patient, including time for face to face visit via telemedicine, review of medical records, imaging studies and discussion of findings with providers, the patient and/or family.   Dr Daisey Must   [facility] [phone]  Case [id number]

## 2019-09-17 NOTE — ED Notes (Signed)
CT called to alert of CODE STROKE.

## 2019-09-17 NOTE — ED Triage Notes (Signed)
Right wrist numbness and left arm numbness; onset 0730. Denies right wrist numbness at present. Left arm numbness present. Pt moves extremities per normal.

## 2019-09-17 NOTE — ED Notes (Signed)
Pt transported to MRI 

## 2019-09-17 NOTE — ED Notes (Signed)
[facility] called. 

## 2019-09-17 NOTE — ED Notes (Signed)
Teleneuro cart at bedside

## 2019-09-17 NOTE — ED Provider Notes (Signed)
[facility]-EMERGENCY DEPT Provider Note   CSN: [id number] Arrival date & time: 09/17/19  4098     History Chief Complaint  Patient presents with  . Numbness    [name] is a 86 y.o. female.   Cerebrovascular Accident This is a new problem. The current episode started 1 to 2 hours ago. The problem occurs constantly. The problem has been gradually improving. Pertinent negatives include no chest pain, no headaches and no shortness of breath. Nothing aggravates the symptoms. Nothing relieves the symptoms. She has tried nothing for the symptoms. The treatment provided mild relief.       Past Medical History:  Diagnosis Date  . Acid reflux    takes Nexium daily  . Anemia    takes Ferrous Sulfate daily  . Aortic stenosis    mild AS pk grad 18, mean grad 9, AVA 1.32 cm 03/2015 echo (Dr. Jacinto Halim)  . Arthritis   . Chronic kidney disease (CKD), stage III (moderate)   . Diverticulitis   . Hematochezia 03/2015  . High cholesterol    can't take the meds  . History of blood transfusion 03/2015   no abnormal reaction noted  . History of GI diverticular bleed   . HTN (hypertension)    takes Losartan-HCTZ and Metoprolol daily  . Joint pain   . Joint swelling   . Nocturia   . Numbness    in legs  . Peripheral edema    occasionally  . Shortness of breath dyspnea    occasionally and with exertion  . Slow urinary stream    at times    Patient Active Problem List   Diagnosis Date Noted  . CKD (chronic kidney disease) stage 3, GFR 30-59 ml/min 07/21/2018  . Stable angina (HCC) 01/21/2018  . Bradycardia 10/23/2017  . Chest pain 09/29/2017  . Acute lower GI bleeding 03/06/2017  . Diverticula, colon 03/06/2017  . HTN (hypertension) 03/06/2017  . Acute kidney injury (HCC) 03/06/2017  . Acute blood loss anemia 03/06/2017  . Hiatal hernia 11/28/2016  . Chest pain, rule out acute myocardial infarction 11/27/2016  . Hyperlipidemia 05/24/2015  . Aortic  stenosis 05/24/2015  . GERD without esophagitis 05/23/2015  . Postoperative anemia due to acute blood loss 05/23/2015  . Osteoarthritis of left hip 05/17/2015  . 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 GI bleed 02/21/2015  . Acute bronchitis 11/16/2012  . Bright red blood per rectum 11/13/2012  . Lower GI bleed 02/23/2012  . History of GI diverticular bleed 02/23/2012  . Diverticulosis 02/23/2012    Past Surgical History:  Procedure Laterality Date  . ABCESS DRAINAGE     abdominal abcess  . cataract surgery Bilateral   . CHOLECYSTECTOMY    . DILATION AND CURETTAGE OF UTERUS    . ESOPHAGOGASTRODUODENOSCOPY N/A 02/23/2015   Procedure: ESOPHAGOGASTRODUODENOSCOPY (EGD);  Surgeon: Carman Ching, MD;  Location: [facility] ENDOSCOPY;  Service: Endoscopy;  Laterality: N/A;  . FLEXIBLE SIGMOIDOSCOPY N/A 02/24/2015   Procedure: FLEXIBLE SIGMOIDOSCOPY;  Surgeon: Carman Ching, MD;  Location: [facility] ENDOSCOPY;  Service: Endoscopy;  Laterality: N/A;  . IR ANGIOGRAM SELECTIVE EACH ADDITIONAL VESSEL  03/06/2017  . IR ANGIOGRAM SELECTIVE EACH ADDITIONAL VESSEL  03/06/2017  . IR ANGIOGRAM SELECTIVE EACH ADDITIONAL VESSEL  03/06/2017  . IR ANGIOGRAM SELECTIVE EACH ADDITIONAL VESSEL  03/08/2017  . IR ANGIOGRAM VISCERAL SELECTIVE  03/06/2017  . IR ANGIOGRAM VISCERAL SELECTIVE  03/06/2017  . IR ANGIOGRAM VISCERAL SELECTIVE  03/06/2017  . IR ANGIOGRAM  VISCERAL SELECTIVE  03/08/2017  . IR ANGIOGRAM VISCERAL SELECTIVE  03/08/2017  . IR EMBO ART  VEN HEMORR LYMPH EXTRAV  INC GUIDE ROADMAPPING  03/06/2017  . IR EMBO ART  VEN HEMORR LYMPH EXTRAV  INC GUIDE ROADMAPPING  03/08/2017  . IR US GUIDE VASC ACCESS RIGHT  03/06/2017  . IR US GUIDE VASC ACCESS RIGHT  03/08/2017  . left knee surgery    . TOTAL HIP ARTHROPLASTY Left 05/17/2015   Procedure: LEFT TOTAL HIP ARTHROPLASTY ANTERIOR APPROACH;  Surgeon: Kathryne Hitch, MD;  Location: [facility] OR;  Service: Orthopedics;  Laterality: Left;     OB History   No  obstetric history on file.     Family History  Problem Relation Age of Onset  . Diabetes Other   . Hypertension Other   . Cancer Other   . Heart disease Other     Social History   Tobacco Use  . Smoking status: Former Games developer  . Smokeless tobacco: Never Used  . Tobacco comment: quit smoking 8+ yrs ago  Vaping Use  . Vaping Use: Never used  Substance Use Topics  . Alcohol use: No    Comment: quit yrs ago  . Drug use: No    Home Medications Prior to Admission medications   Medication Sig Start Date End Date Taking? Authorizing Provider  acetaminophen (TYLENOL) 650 MG CR tablet Take 650 mg by mouth daily.    [provider]  Cholecalciferol (VITAMIN D3) 2000 units TABS Take 2,000 Units by mouth daily.    [provider]  diclofenac sodium (VOLTAREN) 1 % GEL Apply 2 g topically 2 (two) times daily as needed (pain).     [provider]  famotidine (PEPCID) 20 MG tablet Take 20 mg by mouth 2 (two) times daily.    [provider]  Ferrous Sulfate 27 MG TABS Take 1 tablet by mouth daily.    [provider]  isosorbide mononitrate (IMDUR) 60 MG 24 hr tablet Take 1.5 tablets (90 mg total) by mouth daily. 06/16/19   Tereso Newcomer T, PA-C  losartan (COZAAR) 25 MG tablet Take 25 mg by mouth daily.    [provider]  metoprolol tartrate (LOPRESSOR) 25 MG tablet Take 12.5 mg by mouth 2 (two) times daily.     [provider]  Multiple Vitamin (MULITIVITAMIN WITH MINERALS) TABS Take 1 tablet by mouth daily.    [provider]  nitroGLYCERIN (NITROSTAT) 0.4 MG SL tablet Place 0.4 mg under the tongue every 5 (five) minutes as needed for chest pain.     [provider]  polyvinyl alcohol (LIQUIFILM TEARS) 1.4 % ophthalmic solution Place 1 drop into both eyes as needed for dry eyes.    [provider]  PROAIR HFA 108 [phone] Base) MCG/ACT inhaler Inhale 1-2 puffs into the lungs every 4 (four) hours as needed for  shortness of breath or wheezing. 12/04/16   [provider]    Allergies    Ezetimibe, Lipitor [atorvastatin], Pravachol [pravastatin], Statins, Zocor [simvastatin], and Cholestyramine  Review of Systems   Review of Systems  Constitutional: Negative for chills and fever.  HENT: Negative for congestion and rhinorrhea.   Respiratory: Negative for cough and shortness of breath.   Cardiovascular: Negative for chest pain and palpitations.  Gastrointestinal: Negative for diarrhea, nausea and vomiting.  Genitourinary: Negative for difficulty urinating and dysuria.  Musculoskeletal: Negative for arthralgias and back pain.  Skin: Negative for rash and wound.  Neurological: Positive for weakness  and numbness. Negative for light-headedness and headaches.    Physical Exam Updated Vital Signs BP (!) 123/101   Pulse (!) 59   Temp 98.2 F (36.8 C)   Resp 16   Ht 5\' 3"  (1.6 m)   Wt 92.1 kg   SpO2 99%   BMI 35.96 kg/m   Physical Exam Vitals and nursing note reviewed. Exam conducted with a chaperone present.  Constitutional:      General: She is not in acute distress.    Appearance: Normal appearance.  HENT:     Head: Normocephalic and atraumatic.     Nose: No rhinorrhea.  Eyes:     General:        Right eye: No discharge.        Left eye: No discharge.     Conjunctiva/sclera: Conjunctivae normal.  Cardiovascular:     Rate and Rhythm: Normal rate and regular rhythm.  Pulmonary:     Effort: Pulmonary effort is normal. No respiratory distress.     Breath sounds: No stridor.  Abdominal:     General: Abdomen is flat. There is no distension.     Palpations: Abdomen is soft.  Musculoskeletal:        General: No tenderness or signs of injury.  Skin:    General: Skin is warm and dry.  Neurological:     General: No focal deficit present.     Mental Status: She is alert. Mental status is at baseline.     Sensory: Sensory deficit present.     Motor: Weakness present.      Comments: 5 out of 5 motor strength in the right upper and right lower extremity, 4-5 motor strength in the left upper, 5 out of 5 motor strength in the left lower. Sensation normal in the entire right and left lower, reported numbness tingling in the left upper extremity. Cranial nerves without deficit, no pronator drift  Psychiatric:        Mood and Affect: Mood normal.        Behavior: Behavior normal.     ED Results / Procedures / Treatments   Labs (all labs ordered are listed, but only abnormal results are displayed) Labs Reviewed  CBC - Abnormal; Notable for the following components:      Result Value   WBC 3.5 (*)    RBC 3.74 (*)    Hemoglobin 11.6 (*)    All other components within normal limits  COMPREHENSIVE METABOLIC PANEL - Abnormal; Notable for the following components:   Creatinine, Ser 1.36 (*)    GFR calc non Af Amer 33 (*)    GFR calc Af Amer 39 (*)    All other components within normal limits  I-STAT CHEM 8, ED - Abnormal; Notable for the following components:   Creatinine, Ser 1.40 (*)    All other components within normal limits  PROTIME-INR  APTT  DIFFERENTIAL  CBG MONITORING, ED    EKG EKG Interpretation  Date/Time:  Thursday September 17 2019 09:27:25 EDT Ventricular Rate:  55 PR Interval:    QRS Duration: 152 QT Interval:  512 QTC Calculation: 490 R Axis:   82 Text Interpretation: Sinus rhythm Prolonged PR interval Nonspecific intraventricular conduction delay Probable anteroseptal infarct, recent Confirmed by Cherlynn PerchesKatz, Trigo Winterbottom ([id number]) on 09/17/2019 9:36:52 AM   Radiology CT HEAD CODE STROKE WO CONTRAST  Result Date: 09/17/2019 CLINICAL DATA:  Code stroke.  Right wrist and left arm numbness. EXAM: CT HEAD WITHOUT CONTRAST TECHNIQUE: Contiguous axial images  were obtained from the base of the skull through the vertex without intravenous contrast. COMPARISON:  Brain MRI 05/21/2019 FINDINGS: Brain: No evidence of acute infarction, hemorrhage, hydrocephalus,  extra-axial collection or mass lesion/mass effect. Vascular: No hyperdense vessel or unexpected calcification. Skull: Normal. Negative for fracture or focal lesion. Sinuses/Orbits: Bilateral cataract resection Other: These results were called by telephone at the time of interpretation on 09/17/2019 at 9:13 am to provider Bonni Neuser , who verbally acknowledged these results. ASPECTS [facility] Stroke Program Early CT Score) not scored with this history IMPRESSION: Age normal head CT. Electronically Signed   By: Marnee Spring M.D.   On: 09/17/2019 09:15    Procedures .Critical Care Performed by: Sabino Donovan, MD Authorized by: Sabino Donovan, MD   Critical care provider statement:    Critical care time (minutes):  45   Critical care was necessary to treat or prevent imminent or life-threatening deterioration of the following conditions:  CNS failure or compromise   Critical care was time spent personally by me on the following activities:  Discussions with consultants, evaluation of patient's response to treatment, examination of patient, ordering and performing treatments and interventions, ordering and review of laboratory studies, ordering and review of radiographic studies, pulse oximetry, re-evaluation of patient's condition, obtaining history from patient or surrogate, review of old charts and development of treatment plan with patient or surrogate   (including critical care time)  Medications Ordered in ED Medications  sodium chloride flush (NS) 0.9 % injection 3 mL (3 mLs Intravenous Given 09/17/19 0934)  labetalol (NORMODYNE) injection 10 mg (10 mg Intravenous Given 09/17/19 2725)    ED Course  I have reviewed the triage vital signs and the nursing notes.  Pertinent labs & imaging results that were available during my care of the patient were reviewed by me and considered in my medical decision making (see chart for details).    MDM Rules/Calculators/A&P                          Weakness  numbness tingling reported in the left upper extremity, patient comments that it is resolving. However still present. She tried taking nitroglycerin because she thought it might be related to her heart, but did not help. Weakness is minimal relative to the right upper extremity, subjective sensation deficit her vital signs show hypertension but otherwise unremarkable. She took none of her chronic home meds. Last known normal was an hour and a half prior to arrival she woke up, and while she was awake she felt sudden onset of symptoms. We will check a glucose code stroke is ordered, neurology is consulted patient is going to CT scanner.  CT scan was reviewed by radiology and I reviewed the images myself as well, there is no acute intracranial process.  There does appear to be multiple areas of calcification likely secondary to aging process and microvascular disease.  Her hypertension that was initially present was treated with labetalol.  The neuro stroke consult was done and their exam was significant for left lower extremity weakness numbness, I went to reevaluate the patient she, and still on upper extremity weakness.  Her varying exam does not seem very consistent with acute stroke, her NIH stroke score is 2, we will hold with TPA at this time.  She will likely need admission for further management to include MRI possible echocardiogram, and medical optimization.  Her recent history is complicated with possible GI bleeding.  She  is not on anticoagulation or antiplatelet therapy.  Her laboratory analysis is unremarkable for any significant metabolic derangements that could be causing this, her EKG is reviewed by myself shows no acute ischemic changes or arrhythmia that would put her at higher risk for thrombus formation.  I spoke to the hospitalist and reassess the patient.  Her strength seems equal in her upper and lower extremities to me, she still comments on some abnormal sensation on the left relative to  the right.  She will be admitted to the hospitalist service.  Her blood pressure is improved.  The patient will be admitted to the hospitalist.  For the remainder this patient's care please see inpatient team notes.  I will intervene as needed while the patient remains in the emergency department.   CRITICAL CARE Performed by: Sabino Donovan   Total critical care time: 40 minutes  Critical care time was exclusive of separately billable procedures and treating other patients.  Critical care was necessary to treat or prevent imminent or life-threatening deterioration.  Critical care was time spent personally by me on the following activities: development of treatment plan with patient and/or surrogate as well as nursing, discussions with consultants, evaluation of patient's response to treatment, examination of patient, obtaining history from patient or surrogate, ordering and performing treatments and interventions, ordering and review of laboratory studies, ordering and review of radiographic studies, pulse oximetry and re-evaluation of patient's condition.   Final Clinical Impression(s) / ED Diagnoses Final diagnoses:  Left-sided weakness  Left sided numbness    Rx / DC Orders ED Discharge Orders    None       Sabino Donovan, MD 09/17/19 1026

## 2019-09-17 NOTE — ED Notes (Signed)
Carley primary RN at bedside attempting IV.

## 2019-09-18 ENCOUNTER — Inpatient Hospital Stay (HOSPITAL_COMMUNITY): Payer: 59

## 2019-09-18 DIAGNOSIS — K5790 Diverticulosis of intestine, part unspecified, without perforation or abscess without bleeding: Secondary | ICD-10-CM

## 2019-09-18 DIAGNOSIS — I340 Nonrheumatic mitral (valve) insufficiency: Secondary | ICD-10-CM

## 2019-09-18 DIAGNOSIS — I361 Nonrheumatic tricuspid (valve) insufficiency: Secondary | ICD-10-CM

## 2019-09-18 DIAGNOSIS — G8194 Hemiplegia, unspecified affecting left nondominant side: Principal | ICD-10-CM

## 2019-09-18 DIAGNOSIS — I10 Essential (primary) hypertension: Secondary | ICD-10-CM

## 2019-09-18 DIAGNOSIS — N1831 Chronic kidney disease, stage 3a: Secondary | ICD-10-CM

## 2019-09-18 DIAGNOSIS — R531 Weakness: Secondary | ICD-10-CM

## 2019-09-18 LAB — ECHOCARDIOGRAM COMPLETE
AR max vel: 1 cm2
AV Area VTI: 1.08 cm2
AV Area mean vel: 1.01 cm2
AV Mean grad: 13.2 mmHg
AV Peak grad: 24 mmHg
Ao pk vel: 2.45 m/s
Area-P 1/2: 4.31 cm2
Height: 63 in
S' Lateral: 2.6 cm
Weight: 3248 oz

## 2019-09-18 LAB — GLUCOSE, CAPILLARY: Glucose-Capillary: 93 mg/dL (ref 70–99)

## 2019-09-18 LAB — HEMOGLOBIN AND HEMATOCRIT, BLOOD
HCT: 31.2 % — ABNORMAL LOW (ref 36.0–46.0)
Hemoglobin: 10 g/dL — ABNORMAL LOW (ref 12.0–15.0)

## 2019-09-18 MED ORDER — ALUM & MAG HYDROXIDE-SIMETH 200-200-20 MG/5ML PO SUSP
30.0000 mL | Freq: Four times a day (QID) | ORAL | Status: DC | PRN
  Administered 2019-09-18 – 2019-09-19 (×2): 30 mL via ORAL
  Filled 2019-09-18 (×2): qty 30

## 2019-09-18 MED ORDER — METOPROLOL TARTRATE 12.5 MG HALF TABLET
12.5000 mg | ORAL_TABLET | Freq: Two times a day (BID) | ORAL | Status: DC
  Administered 2019-09-18 – 2019-09-20 (×4): 12.5 mg via ORAL
  Filled 2019-09-18 (×6): qty 1

## 2019-09-18 NOTE — Progress Notes (Signed)
Physical Therapy Treatment Patient Details Name: [name] MRN: [id number] DOB: 06-Aug-1923 Today's Date: 09/18/2019    History of Present Illness Pt is  86 y/o female who presents with numbness and weakness in the L side. MRI negative for acute changes. Pt was found with chronic GI bleed in the setting of diverticulosis. PMH significant for HTN, CKD III.     PT Comments    Pt admitted with above diagnosis. Was able to perform transfers and ambulation with gross supervision for safety. Supervision largely provided as this was an initial evaluation, however feel pt is approaching mod I and near baseline of function. She reports feeling almost back to normal as well. It appears that pt has good family support and reports that she could have 24 hour assist if needed at d/c. Pt currently with functional limitations due to the deficits listed below (see PT Problem List). Pt will benefit from skilled PT to increase their independence and safety with mobility to allow discharge to the venue listed below.      Follow Up Recommendations  No PT follow up     Equipment Recommendations  None recommended by PT    Recommendations for Other Services       Precautions / Restrictions Precautions Precautions: None Precaution Comments: Mod fall risk Restrictions Weight Bearing Restrictions: No    Mobility  Bed Mobility Overal bed mobility: Needs Assistance Bed Mobility: Supine to Sit     Supine to sit: Min guard     General bed mobility comments: min guard for safety  Transfers Overall transfer level: Needs assistance Equipment used: Rolling walker (2 wheeled) Transfers: Sit to/from Stand Sit to Stand: Min guard         General transfer comment: min guard for safety pt with safe hand placement  Ambulation/Gait Ambulation/Gait assistance: Supervision Gait Distance (Feet): 200 Feet Assistive device: Rolling walker (2 wheeled) Gait Pattern/deviations: Step-through  pattern;Decreased stride length;Trunk flexed Gait velocity: Decreased Gait velocity interpretation: 1.31 - 2.62 ft/sec, indicative of limited community ambulator General Gait Details: Pt motivated for distance however appears fatigued upon return to the room. Pt requests seated rest prior to standing activity at the sink with OT. VSS at this time.    Stairs             Wheelchair Mobility    Modified Rankin (Stroke Patients Only) Modified Rankin (Stroke Patients Only) Pre-Morbid Rankin Score: No symptoms Modified Rankin: Moderately severe disability     Balance Overall balance assessment: Needs assistance Sitting-balance support: No upper extremity supported;Feet supported Sitting balance-Leahy Scale: Good Sitting balance - Comments: min guard for safety   Standing balance support: During functional activity;No upper extremity supported Standing balance-Leahy Scale: Fair Standing balance comment: statically. Reliant on UE support for dynamic tasks                            Cognition Arousal/Alertness: Awake/alert Behavior During Therapy: WFL for tasks assessed/performed Overall Cognitive Status: Within Functional Limits for tasks assessed                                 General Comments: PT A&Ox4 with good safety awareness and appropriate during session      Exercises      General Comments General comments (skin integrity, edema, etc.): Pt reports she can get assistance upon dc if needed but feels back to baseline  Pertinent Vitals/Pain Pain Assessment: No/denies pain    Home Living Family/patient expects to be discharged to:: Private residence Living Arrangements: Alone Available Help at Discharge: Family;Available 24 hours/day Type of Home: House Home Access: Stairs to enter Entrance Stairs-Rails: Right;Left;Can reach both Home Layout: One level Home Equipment: Grab bars - tub/shower;Cane - single point;Walker -  standard;Shower seat      Prior Function Level of Independence: Independent with assistive device(s)      Comments: Uses the cane more than the walker but otherwise was independent with ADL's - was driving, cooking, and getting her own groceries   PT Goals (current goals can now be found in the care plan section) Acute Rehab PT Goals Patient Stated Goal: be safe at home PT Goal Formulation: With patient Time For Goal Achievement: 09/25/19 Potential to Achieve Goals: Good    Frequency    Min 3X/week      PT Plan      Co-evaluation              AM-PAC PT "6 Clicks" Mobility   Outcome Measure  Help needed turning from your back to your side while in a flat bed without using bedrails?: None Help needed moving from lying on your back to sitting on the side of a flat bed without using bedrails?: None Help needed moving to and from a bed to a chair (including a wheelchair)?: None Help needed standing up from a chair using your arms (e.g., wheelchair or bedside chair)?: None Help needed to walk in hospital room?: None Help needed climbing 3-5 steps with a railing? : A Little 6 Click Score: 23    End of Session Equipment Utilized During Treatment: Gait belt Activity Tolerance: Patient tolerated treatment well Patient left: Other (comment) (Seated in chair by the sink with OT present) Nurse Communication: Mobility status PT Visit Diagnosis: Unsteadiness on feet (R26.81);Other symptoms and signs involving the nervous system (R29.898)     Time: 8299-3716 PT Time Calculation (min) (ACUTE ONLY): 23 min  Charges:  $Gait Training: 8-22 mins                     [name], PT, DPT Acute Rehabilitation Services Pager: [phone] Office: [phone]    [name] 09/18/2019, 1:25 PM

## 2019-09-18 NOTE — Evaluation (Signed)
Occupational Therapy Evaluation Patient Details Name: [name] MRN: [id number] DOB: May 22, 1923 Today's Date: 09/18/2019    History of Present Illness Pt is  86 y/o female who presents with numbness and weakness in the L side. MRI negative for acute changes. Pt was found with chronic GI bleed in the setting of diverticulosis. PMH significant for HTN, CKD III.    Clinical Impression   PTA pt reports being independent with devices for mobility and driving. Pt was admitted for above and treated for problem list below (see OT Problem List). Pt A&Ox4 with good situational and safety awareness. Requires supervision - min guard for ADLs due to decreased balance, weakness, and decreased activity tolerance. Requires Min guard with transfers and ambulation with RW for safety. Pt reports she can get someone to help her at dc and feels back to baseline. Believe pt would benefit from skilled OT services acutely  to increase independence and activity tolerance for ADLs.    Follow Up Recommendations  No OT follow up;Supervision - Intermittent    Equipment Recommendations  Other (comment) (2 wheeled RW)       Precautions / Restrictions Precautions Precautions: None Precaution Comments: Mod fall risk Restrictions Weight Bearing Restrictions: No      Mobility Bed Mobility Overal bed mobility: Needs Assistance Bed Mobility: Supine to Sit     Supine to sit: Min guard     General bed mobility comments: min guard for safety  Transfers Overall transfer level: Needs assistance Equipment used: Rolling walker (2 wheeled) Transfers: Sit to/from Stand Sit to Stand: Min guard         General transfer comment: min guard for safety pt with safe hand placement    Balance Overall balance assessment: Needs assistance Sitting-balance support: No upper extremity supported;Feet supported Sitting balance-Leahy Scale: Good Sitting balance - Comments: min guard for safety   Standing balance  support: During functional activity;No upper extremity supported Standing balance-Leahy Scale: Fair                             ADL either performed or assessed with clinical judgement   ADL Overall ADL's : Needs assistance/impaired     Grooming: Wash/dry face;Standing;Min guard Grooming Details (indicate cue type and reason): Min guard for safety Upper Body Bathing: Supervision/ safety;Sitting   Lower Body Bathing: Sitting/lateral leans;Sit to/from stand;Min guard Lower Body Bathing Details (indicate cue type and reason): min guard with sit to stand Upper Body Dressing : Supervision/safety;Sitting   Lower Body Dressing: Min guard;Sit to/from stand;Sitting/lateral leans Lower Body Dressing Details (indicate cue type and reason): min guard for sit to stand Toilet Transfer: Supervision/safety;Ambulation;RW Toilet Transfer Details (indicate cue type and reason): simulated to recliner Toileting- Clothing Manipulation and Hygiene: Min guard;Sitting/lateral lean;Sit to/from stand       Functional mobility during ADLs: Supervision/safety;Rolling walker General ADL Comments: Pt with generalized weakness and decreased activity tolerance     Vision Baseline Vision/History: Wears glasses Wears Glasses: Reading only Patient Visual Report: No change from baseline Vision Assessment?: No apparent visual deficits            Pertinent Vitals/Pain Pain Assessment: No/denies pain     Hand Dominance Right   Extremity/Trunk Assessment Upper Extremity Assessment Upper Extremity Assessment: Generalized weakness;LUE deficits/detail LUE Deficits / Details: LUE slightly weaker than R but still functional   Lower Extremity Assessment Lower Extremity Assessment: Defer to PT evaluation       Communication Communication  Communication: HOH   Cognition Arousal/Alertness: Awake/alert Behavior During Therapy: WFL for tasks assessed/performed Overall Cognitive Status: Within  Functional Limits for tasks assessed                                 General Comments: PT A&Ox4 with good safety awareness and appropriate during session   General Comments  Pt reports she can get assistance upon dc if needed but feels back to baseline            Home Living Family/patient expects to be discharged to:: Private residence Living Arrangements: Alone Available Help at Discharge: Family;Available 24 hours/day Type of Home: House Home Access: Stairs to enter [employer] of Steps: 2 in the front Entrance Stairs-Rails: Right;Left;Can reach both Home Layout: One level     Bathroom Shower/Tub: Tub/shower unit;Curtain   Bathroom Toilet: Handicapped height     Home Equipment: Grab bars - tub/shower;Cane - single point;Walker - standard;Shower seat          Prior Functioning/Environment Level of Independence: Independent with assistive device(s)        Comments: Uses the cane more than the walker but otherwise was independent with ADL's - was driving, cooking, and getting her own groceries        OT Problem List: Decreased strength;Decreased activity tolerance;Impaired balance (sitting and/or standing);Decreased knowledge of use of DME or AE      OT Treatment/Interventions: Self-care/ADL training;Neuromuscular education;Energy conservation;DME and/or AE instruction;Therapeutic activities;Patient/family education;Balance training    OT Goals(Current goals can be found in the care plan section) Acute Rehab OT Goals Patient Stated Goal: be safe at home OT Goal Formulation: With patient Time For Goal Achievement: 10/02/19 Potential to Achieve Goals: Good  OT Frequency: Min 2X/week    AM-PAC OT "6 Clicks" Daily Activity     Outcome Measure Help from another person eating meals?: None Help from another person taking care of personal grooming?: A Little Help from another person toileting, which includes using toliet, bedpan, or urinal?:  A Little Help from another person bathing (including washing, rinsing, drying)?: A Little Help from another person to put on and taking off regular upper body clothing?: A Little Help from another person to put on and taking off regular lower body clothing?: A Little 6 Click Score: 19   End of Session Equipment Utilized During Treatment: Gait belt;Rolling walker Nurse Communication: Mobility status  Activity Tolerance: Patient tolerated treatment well Patient left: in chair;with call bell/phone within reach;with chair alarm set  OT Visit Diagnosis: Unsteadiness on feet (R26.81);Muscle weakness (generalized) (M62.81);Other symptoms and signs involving the nervous system (R29.898);Hemiplegia and hemiparesis Hemiplegia - Right/Left: Left Hemiplegia - dominant/non-dominant: Non-Dominant Hemiplegia - caused by: Unspecified                Time: 1125-1140 OT Time Calculation (min): 15 min Charges:  OT General Charges $OT Visit: 1 Visit OT Evaluation $OT Eval Moderate Complexity: 1 Mod  Aiyonna Lucado/OTS  Iasia Forcier 09/18/2019, 12:59 PM

## 2019-09-18 NOTE — Progress Notes (Deleted)
NEUROLOGY PROGRESS NOTE  Subjective: Patient no longer is having left arm paresthesias.  She states that her right hand has some paresthesias.  Of interest she states that for the past month or so if not longer she has been having the symptoms and they come and go.  She could not tell me the period of time but they occur but often times are short and sometimes they last longer.  She does not have them every day.  She came to the hospital this time because the paresthesias on her left arm lasted longer.  On this occurrence she states it started at 7 AM and lasted 2 8 PM on 09/17/2019.  Of note: She was seen on 05/21/2019 for bilateral hand numbness.  Per ER no but is described as decreased sensation that occurred every night when she tries to go to sleep.  She also stated that the numbness would go away when she shakes her hand.  Exam: Vitals:   09/18/19 0349 09/18/19 0806  BP: (!) 154/67 (!) 158/69  Pulse: (!) 50 (!) 58  Resp: 11 16  Temp: 97.7 F (36.5 C) 98.3 F (36.8 C)  SpO2: 100% 100%     Physical Exam  Constitutional: Appears well-developed and well-nourished.  Psych: Affect appropriate to situation Eyes: No scleral injection HENT: No OP obstrucion Head: Normocephalic.  Cardiovascular: Normal rate and regular rhythm.  Respiratory: Effort normal, non-labored breathing GI: Soft.  No distension. There is no tenderness.  Skin: WDI   Neuro:  Mental Status: Alert, oriented, thought content appropriate.  Speech fluent without evidence of aphasia.  Able to follow simple step commands without difficulty. Cranial Nerves: II:  Visual fields grossly normal,  III,IV, VI: ptosis not present, extra-ocular motions intact bilaterally pupils equal, round, reactive to light and accommodation V,VII: smile symmetric, facial light touch sensation normal bilaterally VIII: hearing normal bilaterally XI: bilateral shoulder shrug XII: midline tongue extension Motor: Patient has 5/5 strength  throughout, there is some giveaway weakness during the exam.  There is also mild asterixis noted when arms are held out straight. Sensory: Pinprick and light touch intact throughout, bilaterally.  I did do Tinel's, and also prolonged wrist flexion with pressure on the median nerve bilaterally which did not produce tingling Deep Tendon Reflexes: 2+ and symmetric throughout upper extremities.  I cannot elicit lower extremity knee jerk due to significant arthritic changes in her knee.   Plantars: Right: downgoing   Left: downgoing Cerebellar: normal finger-to-nose     Medications:  Scheduled: . famotidine  20 mg Oral BID   Continuous: . sodium chloride Stopped (09/17/19 2100)    Pertinent Labs/Diagnostics: A1c pending Lipid panel pending Echocardiogram pending Vascular ultrasound pending  MR ANGIO HEAD WO CONTRAST  Result Date: 09/17/2019 IMPRESSION: No evidence of recent infarction, hemorrhage, or mass. Minor chronic microvascular ischemic changes. No proximal intracranial vessel occlusion. Electronically Signed   By: Guadlupe Spanish M.D.   On: 09/17/2019 16:03   MR BRAIN WO CONTRAST  Result Date: 09/17/2019  IMPRESSION: No evidence of recent infarction, hemorrhage, or mass. Minor chronic microvascular ischemic changes. No proximal intracranial vessel occlusion. Electronically Signed   By: Guadlupe Spanish M.D.   On: 09/17/2019 16:03   CT HEAD CODE STROKE WO CONTRAST  Result Date: 09/17/2019  IMPRESSION: Age normal head CT. Electronically Signed   By: Marnee Spring M.D.   On: 09/17/2019 09:15   MRI cervical spine that was obtained on 05/21/2019 -  C4-5 shows marked right foraminal encroachment and  moderate to severe left foraminal encroachment due to spurring.  C5-6 shows spinal stenosis most severe on the left along with progressive left foraminal stenosis.  C6-7 progressive left foraminal stenosis along with mild spinal stenosis and moderate foraminal stenosis bilaterally.  Felicie Morn PA-C [facility] [phone]  Assessment:  This is a 86 year old female who has been noting intermittent left arm numbness and right hand numbness for the past month or so.  From previous note in the ED she states that her hand numbness often goes away at night when she shakes her hands.  She stated to me today that her left arm numbness will come and go however this time it lasted longer than usual which is why she came to the ED.  Due to the fact that her MRI of cervical spine shows C5-6 spinal stenosis most severe on the left along with progressive left foraminal stenosis and C6-7 progressive left foraminal stenosis along with mild spinal stenosis I do believe that this is not indicative of TIAs or stroke but more indicative of a encroachment of the exiting peripheral nerves at those levels causing her numbness.  Given her age I do not think that she would be a surgical candidate.  Impression: -Left arm numbness secondary to cervical spine degenerative disc disease  Recommendations: -Physical therapy -Possible cervical traction if physical therapy deems necessary.  As this may relieve some of the pressure on the nerve.    09/18/2019, 10:37 AM

## 2019-09-18 NOTE — Progress Notes (Signed)
[facility]  PROGRESS NOTE  [name] OIN:[id number] DOB: Jun 04, 1923 DOA: 09/17/2019 PCP: System, Pcp Not In   Brief HPI:   86 year old female with past medical history of hypertension, acid reflux, CKD stage IIIb who presented with left-sided numbness.  Symptoms are more in the left arm than the left leg.  Also felt weakness of the arm.  Denies difficulty speaking.  In the ED code stroke was called patient was seen by tele neurology she was not considered a TPA candidate as her symptoms which had improved as well recent episode of rectal bleeding    Subjective   Patient seen and examined, she had one episode of rectal bleeding last night.  Still has mild weakness of left side.   Assessment/Plan:     1. Left arm weakness-stroke has been ruled out.  MRI brain is negative.  MRI cervical spine showed mild right foraminal encroachment and moderate was severe left foraminal encroachment due to spurring.  C5-6 shows spinal stenosis most severe on the left long with progressive left foraminal stenosis.  C6-7 progressive left foraminal stenosis along with mild spinal stenosis and moderate foraminal stenosis bilaterally.  Neurology recommends outpatient PT.  No further medication recommended. 2. Chronic GI bleed-she has history of diverticulosis.  She was last seen by [facility] gastroenterology and colonoscopy was attempted in 27 which could not be done due to significant diverticulosis.  Her hemoglobin has dropped to 10.0.  I called and discussed with EKG Dr. Lavonia Drafts who recommends to monitor hemoglobin, and if continues to have have lower GI bleed she will probably need endoscopic evaluation. 3. CKD stage IIIb-at baseline 4. Hypertension-stroke has been ruled out.  Will restart metoprolol.  Continue to hold losartan.   Scheduled medications:   . famotidine  20 mg Oral BID         CBG: Recent Labs  Lab 09/17/19 0939  GLUCAP 95    SpO2: 99 %    CBC: Recent Labs  Lab  09/17/19 0853 09/17/19 0940 09/18/19 0437  WBC 3.5*  --   --   NEUTROABS 2.1  --   --   HGB 11.6* 12.2 10.0*  HCT 36.4 36.0 31.2*  MCV 97.3  --   --   PLT 211  --   --     Basic Metabolic Panel: Recent Labs  Lab 09/17/19 0853 09/17/19 0940  NA 140 142  K 4.2 4.4  CL 104 104  CO2 27  --   GLUCOSE 97 93  BUN 19 20  CREATININE 1.36* 1.40*  CALCIUM 9.2  --      Liver Function Tests: Recent Labs  Lab 09/17/19 0853  AST 19  ALT 11  ALKPHOS 51  BILITOT 0.4  PROT 7.2  ALBUMIN 4.2     Antibiotics: Anti-infectives (From admission, onward)   None       DVT prophylaxis: Lovenox  Code Status: Full code  Family Communication: No family at bedside    Status is: Inpatient  Dispo: The patient is from: Home              Anticipated d/c is to: Home              Anticipated d/c date is: 09/21/2019              Patient currently not medically stable for discharge  Barrier to discharge-rectal bleeding      Consultants:  Neurology  Procedures:     Objective   Vitals:  09/18/19 0255 09/18/19 0349 09/18/19 0806 09/18/19 1520  BP: 135/61 (!) 154/67 (!) 158/69 135/79  Pulse: (!) 51 (!) 50 (!) 58 64  Resp: 13 11 16 18   Temp: 98.2 F (36.8 C) 97.7 F (36.5 C) 98.3 F (36.8 C) (!) 97.4 F (36.3 C)  TempSrc: Oral Oral Oral Oral  SpO2: 100% 100% 100% 99%  Weight:      Height:        Intake/Output Summary (Last 24 hours) at 09/18/2019 1621 Last data filed at 09/18/2019 11/18/2019 Gross per 24 hour  Intake 240 ml  Output -  Net 240 ml    08/04 1901 - 08/06 0700 In: 120 [P.O.:120] Out: -   Filed Weights   09/17/19 0932  Weight: 92.1 kg    Physical Examination:    General: Appears in no acute distress  Cardiovascular: S1-S2, regular  Respiratory: Clear to auscultation bilaterally  Abdomen: Abdomen is soft, nontender, no organomegaly  Extremities: No edema in the lower extremities  Neurologic: Alert, oriented x3, mild weakness of left  upper extremity    Data Reviewed:   Recent Results (from the past 240 hour(s))  SARS Coronavirus 2 by RT PCR (hospital order, performed in [facility] hospital lab) Nasopharyngeal Nasopharyngeal Swab     Status: None   Collection Time: 09/17/19 11:11 AM   Specimen: Nasopharyngeal Swab  Result Value Ref Range Status   SARS Coronavirus 2 NEGATIVE NEGATIVE Final    Comment: (NOTE) SARS-CoV-2 target nucleic acids are NOT DETECTED.  The SARS-CoV-2 RNA is generally detectable in upper and lower respiratory specimens during the acute phase of infection. The lowest concentration of SARS-CoV-2 viral copies this assay can detect is 250 copies / mL. A negative result does not preclude SARS-CoV-2 infection and should not be used as the sole basis for treatment or other patient management decisions.  A negative result may occur with improper specimen collection / handling, submission of specimen other than nasopharyngeal swab, presence of viral mutation(s) within the areas targeted by this assay, and inadequate number of viral copies (<250 copies / mL). A negative result must be combined with clinical observations, patient history, and epidemiological information.  Fact Sheet for Patients:   11/17/19  Fact Sheet for Healthcare Providers: BoilerBrush.com.cy  This test is not yet approved or  cleared by the https://pope.com/ FDA and has been authorized for detection and/or diagnosis of SARS-CoV-2 by FDA under an Emergency Use Authorization (EUA).  This EUA will remain in effect (meaning this test can be used) for the duration of the COVID-19 declaration under Section 564(b)(1) of the Act, 21 U.S.C. section 360bbb-3(b)(1), unless the authorization is terminated or revoked sooner.  Performed at [facility], [address] [place], [place]       Studies:  MR ANGIO HEAD WO CONTRAST  Result Date:  09/17/2019 CLINICAL DATA:  Left arm numbness, code stroke follow-up EXAM: MRI HEAD WITHOUT CONTRAST MRA HEAD WITHOUT CONTRAST TECHNIQUE: Multiplanar, multiecho pulse sequences of the brain and surrounding structures were obtained without intravenous contrast. Angiographic images of the head were obtained using MRA technique without contrast. COMPARISON:  None. FINDINGS: MRI HEAD Brain: There is no acute infarction or intracranial hemorrhage. There is no intracranial mass, mass effect, or edema. There is no hydrocephalus or extra-axial fluid collection. Ventricles and sulci are within normal limits in size and configuration for age. Patchy T2 hyperintensity in the supratentorial white matter is nonspecific but may reflect minor chronic microvascular ischemic changes. Vascular:  Major vessel flow voids at the skull base are preserved. Skull and upper cervical spine: Normal marrow signal is preserved. Sinuses/Orbits: Paranasal sinuses are aerated. Orbits are unremarkable. Other: Sella is unremarkable.  Mastoid air cells are clear. MRA HEAD Intracranial internal carotid arteries are patent with atherosclerotic irregularity. Middle and anterior cerebral arteries are patent. Intracranial vertebral arteries, basilar artery, posterior cerebral arteries are patent. There is no significant stenosis or aneurysm. IMPRESSION: No evidence of recent infarction, hemorrhage, or mass. Minor chronic microvascular ischemic changes. No proximal intracranial vessel occlusion. Electronically Signed   By: Guadlupe SpanishPraneil  Patel M.D.   On: 09/17/2019 16:03   MR BRAIN WO CONTRAST  Result Date: 09/17/2019 CLINICAL DATA:  Left arm numbness, code stroke follow-up EXAM: MRI HEAD WITHOUT CONTRAST MRA HEAD WITHOUT CONTRAST TECHNIQUE: Multiplanar, multiecho pulse sequences of the brain and surrounding structures were obtained without intravenous contrast. Angiographic images of the head were obtained using MRA technique without contrast. COMPARISON:   None. FINDINGS: MRI HEAD Brain: There is no acute infarction or intracranial hemorrhage. There is no intracranial mass, mass effect, or edema. There is no hydrocephalus or extra-axial fluid collection. Ventricles and sulci are within normal limits in size and configuration for age. Patchy T2 hyperintensity in the supratentorial white matter is nonspecific but may reflect minor chronic microvascular ischemic changes. Vascular: Major vessel flow voids at the skull base are preserved. Skull and upper cervical spine: Normal marrow signal is preserved. Sinuses/Orbits: Paranasal sinuses are aerated. Orbits are unremarkable. Other: Sella is unremarkable.  Mastoid air cells are clear. MRA HEAD Intracranial internal carotid arteries are patent with atherosclerotic irregularity. Middle and anterior cerebral arteries are patent. Intracranial vertebral arteries, basilar artery, posterior cerebral arteries are patent. There is no significant stenosis or aneurysm. IMPRESSION: No evidence of recent infarction, hemorrhage, or mass. Minor chronic microvascular ischemic changes. No proximal intracranial vessel occlusion. Electronically Signed   By: Guadlupe SpanishPraneil  Patel M.D.   On: 09/17/2019 16:03   ECHOCARDIOGRAM COMPLETE  Result Date: 09/18/2019    ECHOCARDIOGRAM REPORT   Patient Name:   Wilder GladeBERTHA C Bouza Date of Exam: 09/18/2019 Medical Rec #:  [id number]      Height:       63.0 in Accession #:    [id number]     Weight:       203.0 lb Date of Birth:  02/20/1923      BSA:          1.946 m Patient Age:    89 years       BP:           158/59 mmHg Patient Gender: F              HR:           56 bpm. Exam Location:  Inpatient Procedure: 2D Echo Indications:    Stroke 434.91 / I163.9  History:        Patient has prior history of Echocardiogram examinations, most                 recent 09/30/2017. Aortic valve disease. Risk factors:                 Hypertension. Dyslipidemia.  Sonographer:    Leta Junglingiffany Cooper RDCS Referring Phys: [phone] [facility] Healthcare[facility]OKUL KRISHNAN  IMPRESSIONS  1. Left ventricular ejection fraction, by estimation, is 60 to 65%. The left ventricle has normal function. The left ventricle has no regional wall motion abnormalities. There is mild left ventricular hypertrophy. Left ventricular diastolic parameters are  consistent with Grade I diastolic dysfunction (impaired relaxation). Elevated left atrial pressure.  2. Right ventricular systolic function is normal. The right ventricular size is normal. There is moderately elevated pulmonary artery systolic pressure.  3. Left atrial size was moderately dilated.  4. Right atrial size was mildly dilated.  5. The mitral valve is normal in structure. Mild mitral valve regurgitation. No evidence of mitral stenosis.  6. The aortic valve is tricuspid. Aortic valve regurgitation is not visualized. Mild aortic valve stenosis.  7. The inferior vena cava is normal in size with greater than 50% respiratory variability, suggesting right atrial pressure of 3 mmHg. FINDINGS  Left Ventricle: Left ventricular ejection fraction, by estimation, is 60 to 65%. The left ventricle has normal function. The left ventricle has no regional wall motion abnormalities. The left ventricular internal cavity size was normal in size. There is  mild left ventricular hypertrophy. Left ventricular diastolic parameters are consistent with Grade I diastolic dysfunction (impaired relaxation). Elevated left atrial pressure. Right Ventricle: The right ventricular size is normal.Right ventricular systolic function is normal. There is moderately elevated pulmonary artery systolic pressure. The tricuspid regurgitant velocity is 3.52 m/s, and with an assumed right atrial pressure of 3 mmHg, the estimated right ventricular systolic pressure is 52.6 mmHg. Left Atrium: Left atrial size was moderately dilated. Right Atrium: Right atrial size was mildly dilated. Pericardium: There is no evidence of pericardial effusion. Mitral Valve: The mitral valve is normal in  structure. Normal mobility of the mitral valve leaflets. Mild mitral annular calcification. Mild mitral valve regurgitation. No evidence of mitral valve stenosis. Tricuspid Valve: The tricuspid valve is normal in structure. Tricuspid valve regurgitation is mild . No evidence of tricuspid stenosis. Aortic Valve: The aortic valve is tricuspid. Aortic valve regurgitation is not visualized. Mild aortic stenosis is present. Aortic valve mean gradient measures 13.2 mmHg. Aortic valve peak gradient measures 24.0 mmHg. Aortic valve area, by VTI measures 1.08 cm. Pulmonic Valve: The pulmonic valve was not well visualized. Pulmonic valve regurgitation is not visualized. No evidence of pulmonic stenosis. Aorta: The aortic root is normal in size and structure. Venous: The inferior vena cava is normal in size with greater than 50% respiratory variability, suggesting right atrial pressure of 3 mmHg. IAS/Shunts: No atrial level shunt detected by color flow Doppler.  LEFT VENTRICLE PLAX 2D LVIDd:         4.20 cm  Diastology LVIDs:         2.60 cm  LV e' lateral:   5.22 cm/s LV PW:         1.20 cm  LV E/e' lateral: 25.3 LV IVS:        1.40 cm  LV e' medial:    4.90 cm/s LVOT diam:     1.80 cm  LV E/e' medial:  26.9 LV SV:         66 LV SV Index:   34 LVOT Area:     2.54 cm  RIGHT VENTRICLE RV S prime:     11.30 cm/s TAPSE (M-mode): 3.4 cm LEFT ATRIUM             Index       RIGHT ATRIUM           Index LA diam:        4.30 cm 2.21 cm/m  RA Area:     21.20 cm LA Vol (A2C):   83.2 ml 42.76 ml/m RA Volume:   59.00 ml  30.32 ml/m LA Vol (  A4C):   91.5 ml 47.00 ml/m LA Biplane Vol: 81.6 ml 41.93 ml/m  AORTIC VALVE AV Area (Vmax):    1.00 cm AV Area (Vmean):   1.01 cm AV Area (VTI):     1.08 cm AV Vmax:           245.00 cm/s AV Vmean:          170.800 cm/s AV VTI:            0.608 m AV Peak Grad:      24.0 mmHg AV Mean Grad:      13.2 mmHg LVOT Vmax:         95.80 cm/s LVOT Vmean:        67.600 cm/s LVOT VTI:          0.259 m  LVOT/AV VTI ratio: 0.43  AORTA Ao Root diam: 3.30 cm MITRAL VALVE                TRICUSPID VALVE MV Area (PHT): 4.31 cm     TR Peak grad:   49.6 mmHg MV Decel Time: 176 msec     TR Vmax:        352.00 cm/s MV E velocity: 132.00 cm/s MV A velocity: 124.00 cm/s  SHUNTS MV E/A ratio:  1.06         Systemic VTI:  0.26 m                             Systemic Diam: 1.80 cm Olga Millers MD Electronically signed by Olga Millers MD Signature Date/Time: 09/18/2019/12:02:15 PM    Final    CT HEAD CODE STROKE WO CONTRAST  Result Date: 09/17/2019 CLINICAL DATA:  Code stroke.  Right wrist and left arm numbness. EXAM: CT HEAD WITHOUT CONTRAST TECHNIQUE: Contiguous axial images were obtained from the base of the skull through the vertex without intravenous contrast. COMPARISON:  Brain MRI 05/21/2019 FINDINGS: Brain: No evidence of acute infarction, hemorrhage, hydrocephalus, extra-axial collection or mass lesion/mass effect. Vascular: No hyperdense vessel or unexpected calcification. Skull: Normal. Negative for fracture or focal lesion. Sinuses/Orbits: Bilateral cataract resection Other: These results were called by telephone at the time of interpretation on 09/17/2019 at 9:13 am to provider ERIC KATZ , who verbally acknowledged these results. ASPECTS [facility] Stroke Program Early CT Score) not scored with this history IMPRESSION: Age normal head CT. Electronically Signed   By: Marnee Spring M.D.   On: 09/17/2019 09:15   VAS US CAROTID (at [facility] and [facility] only)  Result Date: 09/18/2019 Carotid Arterial Duplex Study Indications:       CVA. Risk Factors:      Hypertension. Comparison Study:  No prior studies. Performing Technologist: Chanda Busing RVT  Examination Guidelines: A complete evaluation includes B-mode imaging, spectral Doppler, color Doppler, and power Doppler as needed of all accessible portions of each vessel. Bilateral testing is considered an integral part of a complete examination. Limited examinations for  reoccurring indications may be performed as noted.  Right Carotid Findings: +----------+--------+--------+--------+-----------------------+--------+           PSV cm/sEDV cm/sStenosisPlaque Description     Comments +----------+--------+--------+--------+-----------------------+--------+ CCA Prox  63      7               smooth and heterogenoustortuous +----------+--------+--------+--------+-----------------------+--------+ CCA Distal51      12              smooth and heterogenous         +----------+--------+--------+--------+-----------------------+--------+  ICA Prox  38      10              smooth and heterogenous         +----------+--------+--------+--------+-----------------------+--------+ ICA Distal58      13                                     tortuous +----------+--------+--------+--------+-----------------------+--------+ ECA       43      2                                               +----------+--------+--------+--------+-----------------------+--------+ +----------+--------+-------+--------+-------------------+           PSV cm/sEDV cmsDescribeArm Pressure (mmHG) +----------+--------+-------+--------+-------------------+ [id number]                                         +----------+--------+-------+--------+-------------------+ +---------+--------+--+--------+--+---------+ VertebralPSV cm/s56EDV cm/s15Antegrade +---------+--------+--+--------+--+---------+  Left Carotid Findings: +----------+--------+--------+--------+-----------------------+--------+           PSV cm/sEDV cm/sStenosisPlaque Description     Comments +----------+--------+--------+--------+-----------------------+--------+ CCA Prox  59      7               smooth and heterogenoustortuous +----------+--------+--------+--------+-----------------------+--------+ CCA Distal39      9               smooth and heterogenous          +----------+--------+--------+--------+-----------------------+--------+ ICA Prox  41      9               smooth and heterogenous         +----------+--------+--------+--------+-----------------------+--------+ ICA Distal105     23                                     tortuous +----------+--------+--------+--------+-----------------------+--------+ ECA       29      2                                               +----------+--------+--------+--------+-----------------------+--------+ +----------+--------+--------+--------+-------------------+           PSV cm/sEDV cm/sDescribeArm Pressure (mmHG) +----------+--------+--------+--------+-------------------+ Subclavian101                                         +----------+--------+--------+--------+-------------------+ +---------+--------+--+--------+-+---------+ VertebralPSV cm/s42EDV cm/s6Antegrade +---------+--------+--+--------+-+---------+   Summary: Right Carotid: Velocities in the right ICA are consistent with a 1-39% stenosis. Left Carotid: Velocities in the left ICA are consistent with a 1-39% stenosis. Vertebrals: Bilateral vertebral arteries demonstrate antegrade flow. *See table(s) above for measurements and observations.     Preliminary        Meredeth Ide   [facility] If 7PM-7AM, please contact night-coverage at [email or web address], Office  [phone]   09/18/2019, 4:21 PM  LOS: 1 day

## 2019-09-18 NOTE — Plan of Care (Signed)
  Problem: Education: Goal: Knowledge of General Education information will improve Description: Including pain rating scale, medication(s)/side effects and non-pharmacologic comfort measures Outcome: Progressing   Problem: Coping: Goal: Level of anxiety will decrease Outcome: Progressing   Problem: Elimination: Goal: Will not experience complications related to bowel motility Outcome: Progressing   Problem: Safety: Goal: Ability to remain free from injury will improve Outcome: Progressing   Problem: Skin Integrity: Goal: Risk for impaired skin integrity will decrease Outcome: Progressing   Problem: Education: Goal: Knowledge of disease or condition will improve Outcome: Progressing Goal: Knowledge of secondary prevention will improve Outcome: Progressing Goal: Knowledge of patient specific risk factors addressed and post discharge goals established will improve Outcome: Progressing Goal: Individualized Educational Video(s) Outcome: Progressing

## 2019-09-18 NOTE — Progress Notes (Signed)
Carotid artery duplex has been completed. Preliminary results can be found in CV Proc through chart review.   09/18/19 2:41 PM Olen Cordial RVT

## 2019-09-18 NOTE — Progress Notes (Addendum)
Floor coverage  Notified by nursing staff that patient had an episode of well-formed stool with bright red blood in the toilet bowl.  No complaints of abdominal pain.  Hemodynamically stable.    Admitted for possible stroke and currently receiving aspirin.  Head CT negative.  Brain MRI negative for acute stroke.  MR angiogram head showing no proximal intracranial vessel occlusion.  Patient is also receiving Lovenox for DVT prophylaxis.  She has a history of chronic GI bleed.  Prior colonoscopy from 2017 showing extensive diverticular disease.  Plan -Hold aspirin and Lovenox -Check H&H -Consider consulting GI in a.m. if there is recurrence of hematochezia.  Addendum: SCDs for DVT prophylaxis.

## 2019-09-18 NOTE — Progress Notes (Signed)
RN called into room due to patient having bloody stool.  Patient had went to the bathroom and passed a formed stool with bright red blood present in the water bowl.  On call provider notified.

## 2019-09-18 NOTE — Consult Note (Addendum)
Neurology Consultation  Reason for Consult: Left arm numbness Referring Physician: Dr. Sharl Ma  CC: Left arm numbness  History is obtained from: Patient  HPI: [name] is a 86 y.o. female with history of hyperlipidemia, hypertension, chronic kidney disease, hearing loss and lower GI bleed. she states that for the past month or so if not longer she has been having the symptoms and they come and go.  She could not tell me the period of time but they occur but often times are short and sometimes they last longer.  She does not have them every day.  She came to the hospital this time because the paresthesias on her left arm lasted longer. For that reason she came to the ED.  Of note: She was seen on 05/21/2019 for bilateral hand numbness.  Per ER note it was described as decreased sensation that occurred every night when she tries to go to sleep.  She also stated that the numbness would go away when she shakes her hand.   LKW: 8:30 AM on 09/17/2019 tpa given?: no, history of GI bleed 1 week ago Premorbid modified Rankin scale (mRS): 0  NIHSS 1a Level of Conscious.: 0 1b LOC Questions: 0 1c LOC Commands: 0 2 Best Gaze: 0 3 Visual: 0 4 Facial Palsy: 0 5a Motor Arm - left: 0 5b Motor Arm - Right: 0 6a Motor Leg - Left: 0 6b Motor Leg - Right: 0 7 Limb Ataxia:0  8 Sensory: 1 9 Best Language: 0 10 Dysarthria: 0 11 Extinct. and Inatten.: 0 TOTAL: 1   Past Medical History:  Diagnosis Date  . Acid reflux    takes Nexium daily  . Anemia    takes Ferrous Sulfate daily  . Aortic stenosis    mild AS pk grad 18, mean grad 9, AVA 1.32 cm 03/2015 echo (Dr. Jacinto Halim)  . Arthritis   . Chronic kidney disease (CKD), stage III (moderate)   . Diverticulitis   . Hematochezia 03/2015  . High cholesterol    can't take the meds  . History of blood transfusion 03/2015   no abnormal reaction noted  . History of GI diverticular bleed   . HTN (hypertension)    takes Losartan-HCTZ and Metoprolol daily   . Joint pain   . Joint swelling   . Nocturia   . Numbness    in legs  . Peripheral edema    occasionally  . Shortness of breath dyspnea    occasionally and with exertion  . Slow urinary stream    at times     Family History  Problem Relation Age of Onset  . Diabetes Other   . Hypertension Other   . Cancer Other   . Heart disease Other    Social History:   reports that she has quit smoking. She has never used smokeless tobacco. She reports that she does not drink alcohol and does not use drugs.  Medications  Current Facility-Administered Medications:  .  0.9 %  sodium chloride infusion, , Intravenous, Continuous, Osvaldo Shipper, MD, Stopped at 09/17/19 2100 .  acetaminophen (TYLENOL) tablet 650 mg, 650 mg, Oral, Q4H PRN **OR** acetaminophen (TYLENOL) 160 MG/5ML solution 650 mg, 650 mg, Per Tube, Q4H PRN **OR** acetaminophen (TYLENOL) suppository 650 mg, 650 mg, Rectal, Q4H PRN, Osvaldo Shipper, MD .  famotidine (PEPCID) tablet 20 mg, 20 mg, Oral, BID, Osvaldo Shipper, MD, 20 mg at 09/18/19 1031 .  senna-docusate (Senokot-S) tablet 1 tablet, 1 tablet, Oral, QHS PRN, Rito Ehrlich,  Gokul, MD  ROS:     General ROS: negative for - chills, fatigue, fever, night sweats, weight gain or weight loss Psychological ROS: negative for - behavioral disorder, hallucinations, memory difficulties, mood swings or suicidal ideation Ophthalmic ROS: negative for - blurry vision, double vision, eye pain or loss of vision ENT ROS: negative for - epistaxis, nasal discharge, oral lesions, sore throat, tinnitus or vertigo Respiratory ROS: negative for - cough, hemoptysis, shortness of breath or wheezing Cardiovascular ROS: negative for - chest pain, dyspnea on exertion, edema or irregular heartbeat Gastrointestinal ROS: negative for - abdominal pain, diarrhea, hematemesis, nausea/vomiting or stool incontinence Genito-Urinary ROS: negative for - dysuria, hematuria, incontinence or urinary  frequency/urgency Musculoskeletal ROS: negative for - joint swelling or muscular weakness Neurological ROS: as noted in HPI Dermatological ROS: negative for rash and skin lesion changes  Exam: Current vital signs: BP 135/79 (BP Location: Left Arm)   Pulse 64   Temp (!) 97.4 F (36.3 C) (Oral)   Resp 18   Ht 5\' 3"  (1.6 m)   Wt 92.1 kg   SpO2 99%   BMI 35.96 kg/m  Vital signs in last 24 hours: Temp:  [97.4 F (36.3 C)-98.3 F (36.8 C)] 97.4 F (36.3 C) (08/06 1520) Pulse Rate:  [50-68] 64 (08/06 1520) Resp:  [11-24] 18 (08/06 1520) BP: (135-188)/(61-91) 135/79 (08/06 1520) SpO2:  [99 %-100 %] 99 % (08/06 1520)   Constitutional: Appears well-developed and well-nourished.  Psych: Affect appropriate to situation Eyes: No scleral injection HENT: No OP obstrucion Head: Normocephalic.  Cardiovascular: Normal rate and regular rhythm.  Respiratory: Effort normal, non-labored breathing GI: Soft.  No distension. There is no tenderness.  Skin: WDI  Neuro: Mental Status: Alert, oriented, thought content appropriate.  Speech fluent without evidence of aphasia.  Able to follow simple step commands without difficulty. Cranial Nerves: II:  Visual fields grossly normal,  III,IV, VI: ptosis not present, extra-ocular motions intact bilaterally pupils equal, round, reactive to light and accommodation V,VII: smile symmetric, facial light touch sensation normal bilaterally VIII: hearing normal bilaterally XI: bilateral shoulder shrug XII: midline tongue extension Motor: Patient has 5/5 strength throughout, there is some giveaway weakness during the exam.  There is also mild asterixis noted when arms are held out straight. Sensory: Pinprick and light touch intact throughout, bilaterally.  I did do Tinel's, and also prolonged wrist flexion with pressure on the median nerve bilaterally which did not produce tingling Deep Tendon Reflexes: 2+ and symmetric throughout upper extremities.  I  cannot elicit lower extremity knee jerk due to significant arthritic changes in her knee.   Plantars: Right: downgoing                                Left: downgoing Cerebellar: normal finger-to-nose  Labs I have reviewed labs in epic and the results pertinent to this consultation are:   CBC    Component Value Date/Time   WBC 3.5 (L) 09/17/2019 0853   RBC 3.74 (L) 09/17/2019 0853   HGB 10.0 (L) 09/18/2019 0437   HGB 11.2 07/28/2018 1420   HCT 31.2 (L) 09/18/2019 0437   HCT 32.5 (L) 07/28/2018 1420   PLT 211 09/17/2019 0853   PLT 245 07/28/2018 1420   MCV 97.3 09/17/2019 0853   MCV 87 07/28/2018 1420   MCH 31.0 09/17/2019 0853   MCHC 31.9 09/17/2019 0853   RDW 14.8 09/17/2019 0853   RDW 13.2 07/28/2018 1420  LYMPHSABS 0.9 09/17/2019 0853   MONOABS 0.4 09/17/2019 0853   EOSABS 0.1 09/17/2019 0853   BASOSABS 0.0 09/17/2019 0853    CMP     Component Value Date/Time   NA 142 09/17/2019 0940   NA 139 07/22/2018 0845   K 4.4 09/17/2019 0940   CL 104 09/17/2019 0940   CO2 27 09/17/2019 0853   GLUCOSE 93 09/17/2019 0940   BUN 20 09/17/2019 0940   BUN 17 07/22/2018 0845   CREATININE 1.40 (H) 09/17/2019 0940   CALCIUM 9.2 09/17/2019 0853   PROT 7.2 09/17/2019 0853   ALBUMIN 4.2 09/17/2019 0853   AST 19 09/17/2019 0853   ALT 11 09/17/2019 0853   ALKPHOS 51 09/17/2019 0853   BILITOT 0.4 09/17/2019 0853   GFRNONAA 33 (L) 09/17/2019 0853   GFRAA 39 (L) 09/17/2019 0853    Lipid Panel     Component Value Date/Time   CHOL (H) 01/06/2009 0149    209        ATP III CLASSIFICATION:  <200     mg/dL   Desirable  119-147200-239  mg/dL   Borderline High  >=829>=240    mg/dL   High          TRIG 51 01/06/2009 0149   HDL 56 01/06/2009 0149   CHOLHDL 3.7 01/06/2009 0149   VLDL 10 01/06/2009 0149   LDLCALC (H) 01/06/2009 0149    143        Total Cholesterol/HDL:CHD Risk Coronary Heart Disease Risk Table                     Men   Women  1/2 Average Risk   3.4   3.3  Average Risk        5.0   4.4  2 X Average Risk   9.6   7.1  3 X Average Risk  23.4   11.0        Use the calculated Patient Ratio above and the CHD Risk Table to determine the patient's CHD Risk.        ATP III CLASSIFICATION (LDL):  <100     mg/dL   Optimal  562-130100-129  mg/dL   Near or Above                    Optimal  130-159  mg/dL   Borderline  [id number]  mg/dL   High  >696>190     mg/dL   Very High     Imaging-labs I have reviewed the images obtained:  A1c pending Lipid panel pending Echocardiogram pending Vascular ultrasound pending  MR ANGIO HEAD WO CONTRAST  Result Date: 09/17/2019 IMPRESSION: No evidence of recent infarction, hemorrhage, or mass. Minor chronic microvascular ischemic changes. No proximal intracranial vessel occlusion. Electronically Signed   By: Guadlupe SpanishPraneil  Patel M.D.   On: 09/17/2019 16:03   MR BRAIN WO CONTRAST  Result Date: 09/17/2019  IMPRESSION: No evidence of recent infarction, hemorrhage, or mass. Minor chronic microvascular ischemic changes. No proximal intracranial vessel occlusion. Electronically Signed   By: Guadlupe SpanishPraneil  Patel M.D.   On: 09/17/2019 16:03   CT HEAD CODE STROKE WO CONTRAST  Result Date: 09/17/2019  IMPRESSION: Age normal head CT. Electronically Signed   By: Marnee SpringJonathon  Watts M.D.   On: 09/17/2019 09:15   MRI cervical spine that was obtained on 05/21/2019 -  C4-5 shows marked right foraminal encroachment and moderate to severe left foraminal encroachment due to spurring.  C5-6 shows spinal  stenosis most severe on the left along with progressive left foraminal stenosis.  C6-7 progressive left foraminal stenosis along with mild spinal stenosis and moderate foraminal stenosis bilaterally.  Felicie Morn PA-C [facility] [phone]  M-F  (9:00 am- 5:00 PM)  09/18/2019, 4:08 PM   I have seen and evaluated the patient. I have reviewed the above note.   Assessment:  This is a 86 year old female who has been noting intermittent left arm numbness  and right hand numbness for the past month or so.  From previous note in the ED she states that her hand numbness often goes away at night when she shakes her hands.  She stated to me today that her left arm numbness will come and go however this time it lasted longer than usual which is why she came to the ED.  I suspect that this is not indicative of TIAs or stroke but more indicative of a encroachment of the exiting peripheral nerves at those levels causing her numbness.  I discussed whether she would consider surgery in any case and she did not want to pursue a surgical consult, which I feel is appropriate considering her age.   Impression: -Left arm numbness secondary to cervical spine degenerative disc disease  Recommendations: -Physical therapy - No further recommendations at this time. Please call with further questions or concerns.   Ritta Slot, MD [facility] [phone]  If 7pm- 7am, please page neurology on call as listed in AMION.

## 2019-09-18 NOTE — Evaluation (Signed)
Speech Language Pathology Evaluation Patient Details Name: [name] MRN: [id number] DOB: December 04, 1923 Today's Date: 09/18/2019 Time: 1445-1500 SLP Time Calculation (min) (ACUTE ONLY): 15 min  Problem List:  Patient Active Problem List   Diagnosis Date Noted  . Left hemiparesis (HCC) 09/17/2019  . CKD (chronic kidney disease) stage 3, GFR 30-59 ml/min 07/21/2018  . Stable angina (HCC) 01/21/2018  . Bradycardia 10/23/2017  . Chest pain 09/29/2017  . Acute lower GI bleeding 03/06/2017  . Diverticula, colon 03/06/2017  . HTN (hypertension) 03/06/2017  . Acute kidney injury (HCC) 03/06/2017  . Acute blood loss anemia 03/06/2017  . Hiatal hernia 11/28/2016  . Chest pain, rule out acute myocardial infarction 11/27/2016  . Hyperlipidemia 05/24/2015  . Aortic stenosis 05/24/2015  . GERD without esophagitis 05/23/2015  . Postoperative anemia due to acute blood loss 05/23/2015  . Osteoarthritis of left hip 05/17/2015  . 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 GI bleed 02/21/2015  . Acute bronchitis 11/16/2012  . Bright red blood per rectum 11/13/2012  . Lower GI bleed 02/23/2012  . History of GI diverticular bleed 02/23/2012  . Diverticulosis 02/23/2012   Past Medical History:  Past Medical History:  Diagnosis Date  . Acid reflux    takes Nexium daily  . Anemia    takes Ferrous Sulfate daily  . Aortic stenosis    mild AS pk grad 18, mean grad 9, AVA 1.32 cm 03/2015 echo (Dr. Jacinto Halim)  . Arthritis   . Chronic kidney disease (CKD), stage III (moderate)   . Diverticulitis   . Hematochezia 03/2015  . High cholesterol    can't take the meds  . History of blood transfusion 03/2015   no abnormal reaction noted  . History of GI diverticular bleed   . HTN (hypertension)    takes Losartan-HCTZ and Metoprolol daily  . Joint pain   . Joint swelling   . Nocturia   . Numbness    in legs  . Peripheral edema    occasionally  . Shortness of breath dyspnea     occasionally and with exertion  . Slow urinary stream    at times   Past Surgical History:  Past Surgical History:  Procedure Laterality Date  . ABCESS DRAINAGE     abdominal abcess  . cataract surgery Bilateral   . CHOLECYSTECTOMY    . DILATION AND CURETTAGE OF UTERUS    . ESOPHAGOGASTRODUODENOSCOPY N/A 02/23/2015   Procedure: ESOPHAGOGASTRODUODENOSCOPY (EGD);  Surgeon: Carman Ching, MD;  Location: [facility] ENDOSCOPY;  Service: Endoscopy;  Laterality: N/A;  . FLEXIBLE SIGMOIDOSCOPY N/A 02/24/2015   Procedure: FLEXIBLE SIGMOIDOSCOPY;  Surgeon: Carman Ching, MD;  Location: [facility] ENDOSCOPY;  Service: Endoscopy;  Laterality: N/A;  . IR ANGIOGRAM SELECTIVE EACH ADDITIONAL VESSEL  03/06/2017  . IR ANGIOGRAM SELECTIVE EACH ADDITIONAL VESSEL  03/06/2017  . IR ANGIOGRAM SELECTIVE EACH ADDITIONAL VESSEL  03/06/2017  . IR ANGIOGRAM SELECTIVE EACH ADDITIONAL VESSEL  03/08/2017  . IR ANGIOGRAM VISCERAL SELECTIVE  03/06/2017  . IR ANGIOGRAM VISCERAL SELECTIVE  03/06/2017  . IR ANGIOGRAM VISCERAL SELECTIVE  03/06/2017  . IR ANGIOGRAM VISCERAL SELECTIVE  03/08/2017  . IR ANGIOGRAM VISCERAL SELECTIVE  03/08/2017  . IR EMBO ART  VEN HEMORR LYMPH EXTRAV  INC GUIDE ROADMAPPING  03/06/2017  . IR EMBO ART  VEN HEMORR LYMPH EXTRAV  INC GUIDE ROADMAPPING  03/08/2017  . IR US GUIDE VASC ACCESS RIGHT  03/06/2017  . IR US GUIDE VASC ACCESS RIGHT  03/08/2017  . left  knee surgery    . TOTAL HIP ARTHROPLASTY Left 05/17/2015   Procedure: LEFT TOTAL HIP ARTHROPLASTY ANTERIOR APPROACH;  Surgeon: Kathryne Hitch, MD;  Location: [facility] OR;  Service: Orthopedics;  Laterality: Left;   HPI:  Pt is a 86 y.o. female with a past medical history of hypertension, acid reflux, chronic kidney disease stage IIIb who presented secondary to left-sided numbness. MRI was negative for acute changes.    Assessment / Plan / Recommendation Clinical Impression  Pt participated in speech/language evaluation. She reported that her memory is "not as good  as it used to be" but attributed this to her advanced age. She denied any acute changes in speech, language or cognition. Her speech and language skills are currently within normal limits and her cognitive-linguistic skills are within functional limits. Further skilled SLP services are not clinically indicated at this time. Pt and nursing were educated regarding results and recommendations; both parties verbalized understanding as well as agreement with plan of care.    SLP Assessment  SLP Recommendation/Assessment: Patient does not need any further Speech Lanaguage Pathology Services SLP Visit Diagnosis: Cognitive communication deficit (R41.841)    Follow Up Recommendations  None    Frequency and Duration           SLP Evaluation Cognition  Overall Cognitive Status: Within Functional Limits for tasks assessed Arousal/Alertness: Awake/alert Orientation Level: Oriented X4 Attention: Focused;Sustained Focused Attention: Appears intact Sustained Attention: Appears intact Memory: Appears intact (Immediate: 3/3; delayed: 2/3; with cue: 1/1) Awareness: Appears intact Problem Solving: Appears intact       Comprehension  Auditory Comprehension Overall Auditory Comprehension: Appears within functional limits for tasks assessed Yes/No Questions: Within Functional Limits Commands: Within Functional Limits Conversation: Complex    Expression Expression Primary Mode of Expression: Verbal Verbal Expression Overall Verbal Expression: Appears within functional limits for tasks assessed Initiation: No impairment Automatic Speech: Counting;Day of week;Month of year (WNL) Level of Generative/Spontaneous Verbalization: Conversation Repetition: No impairment Naming: No impairment Pragmatics: No impairment Written Expression Dominant Hand: Right   Oral / Motor  Oral Motor/Sensory Function Overall Oral Motor/Sensory Function: Within functional limits Motor Speech Overall Motor Speech:  Appears within functional limits for tasks assessed Respiration: Within functional limits Phonation: Normal Resonance: Within functional limits Articulation: Within functional limitis Intelligibility: Intelligible Motor Planning: Witnin functional limits Motor Speech Errors: Not applicable   Talin Rozeboom I. Vear Clock, MS, CCC-SLP Acute Rehabilitation Services Office number [phone] Pager [phone]                    Scheryl Marten 09/18/2019, 3:01 PM

## 2019-09-19 LAB — BASIC METABOLIC PANEL
Anion gap: 6 (ref 5–15)
BUN: 17 mg/dL (ref 8–23)
CO2: 26 mmol/L (ref 22–32)
Calcium: 8.7 mg/dL — ABNORMAL LOW (ref 8.9–10.3)
Chloride: 106 mmol/L (ref 98–111)
Creatinine, Ser: 1.19 mg/dL — ABNORMAL HIGH (ref 0.44–1.00)
GFR calc Af Amer: 46 mL/min — ABNORMAL LOW (ref >60)
GFR calc non Af Amer: 39 mL/min — ABNORMAL LOW (ref >60)
Glucose, Bld: 87 mg/dL (ref 70–99)
Potassium: 4 mmol/L (ref 3.5–5.1)
Sodium: 138 mmol/L (ref 135–145)

## 2019-09-19 LAB — GLUCOSE, CAPILLARY
Glucose-Capillary: 77 mg/dL (ref 70–99)
Glucose-Capillary: 89 mg/dL (ref 70–99)
Glucose-Capillary: 85 mg/dL (ref 70–99)
Glucose-Capillary: 114 mg/dL — ABNORMAL HIGH (ref 70–99)

## 2019-09-19 LAB — CBC
HCT: 32.6 % — ABNORMAL LOW (ref 36.0–46.0)
Hemoglobin: 10.2 g/dL — ABNORMAL LOW (ref 12.0–15.0)
MCH: 29.7 pg (ref 26.0–34.0)
MCHC: 31.3 g/dL (ref 30.0–36.0)
MCV: 95 fL (ref 80.0–100.0)
Platelets: 201 10*3/uL (ref 150–400)
RBC: 3.43 MIL/uL — ABNORMAL LOW (ref 3.87–5.11)
RDW: 14.6 % (ref 11.5–15.5)
WBC: 3.1 10*3/uL — ABNORMAL LOW (ref 4.0–10.5)
nRBC: 0 % (ref 0.0–0.2)

## 2019-09-19 MED ORDER — LOSARTAN POTASSIUM 25 MG PO TABS
25.0000 mg | ORAL_TABLET | Freq: Every day | ORAL | Status: DC
  Administered 2019-09-19 – 2019-09-21 (×3): 25 mg via ORAL
  Filled 2019-09-19 (×3): qty 1

## 2019-09-19 MED ORDER — ISOSORBIDE MONONITRATE ER 60 MG PO TB24
90.0000 mg | ORAL_TABLET | Freq: Every day | ORAL | Status: DC
  Administered 2019-09-19 – 2019-09-21 (×3): 90 mg via ORAL
  Filled 2019-09-19 (×3): qty 1

## 2019-09-19 NOTE — Plan of Care (Signed)

## 2019-09-19 NOTE — Progress Notes (Signed)
Occupational Therapy Treatment Patient Details Name: [name] MRN: [id number] DOB: 1923-10-10 Today's Date: 09/19/2019    History of present illness Pt is  86 y/o female who presents with numbness and weakness in the L side. MRI negative for acute changes. Pt was found with chronic GI bleed in the setting of diverticulosis. PMH significant for HTN, CKD III.    OT comments  Pt demonstrates improved balance, Lt UE function and increased activity tolerance.  She is able to perform ADLs with supervision.  She was instructed on initial HEP for Lt UE.    Follow Up Recommendations  No OT follow up;Supervision - Intermittent    Equipment Recommendations  None recommended by OT    Recommendations for Other Services      Precautions / Restrictions Precautions Precautions: None Precaution Comments: Mod fall risk Restrictions Weight Bearing Restrictions: No       Mobility Bed Mobility Overal bed mobility: Modified Independent             General bed mobility comments: HOB elevated   Transfers Overall transfer level: Needs assistance Equipment used: Rolling walker (2 wheeled) Transfers: Sit to/from [employer] Sit to Stand: Supervision Stand pivot transfers: Supervision            Balance Overall balance assessment: Needs assistance Sitting-balance support: No upper extremity supported;Feet supported Sitting balance-Leahy Scale: Good     Standing balance support: No upper extremity supported;During functional activity Standing balance-Leahy Scale: Fair                             ADL either performed or assessed with clinical judgement   ADL Overall ADL's : Needs assistance/impaired                 Upper Body Dressing : Supervision/safety;Standing   Lower Body Dressing: Supervision/safety;Sit to/from stand   Toilet Transfer: Supervision/safety;Ambulation;Comfort height toilet;Grab bars;RW   Toileting- Clothing  Manipulation and Hygiene: Supervision/safety;Sit to/from stand       Functional mobility during ADLs: Supervision/safety       Vision       Perception     Praxis      Cognition Arousal/Alertness: Awake/alert Behavior During Therapy: WFL for tasks assessed/performed Overall Cognitive Status: Within Functional Limits for tasks assessed                                          Exercises Exercises: Other exercises Other Exercises Other Exercises: Pt instructed to perform Lt shoulder shoulder flexion x 10 each commercial break as she tends to fatigue after 10 reps, but able to continue after a rest break.  She performed 3 sets of 10 during OT session    Shoulder Instructions       General Comments      Pertinent Vitals/ Pain       Pain Assessment: No/denies pain  Home Living                                          Prior Functioning/Environment              Frequency  Min 2X/week        Progress Toward Goals  OT Goals(current goals can now be found  in the care plan section)  Progress towards OT goals: Progressing toward goals     Plan Discharge plan remains appropriate    Co-evaluation                 AM-PAC OT "6 Clicks" Daily Activity     Outcome Measure   Help from another person eating meals?: None Help from another person taking care of personal grooming?: A Little Help from another person toileting, which includes using toliet, bedpan, or urinal?: A Little Help from another person bathing (including washing, rinsing, drying)?: A Little Help from another person to put on and taking off regular upper body clothing?: A Little Help from another person to put on and taking off regular lower body clothing?: A Little 6 Click Score: 19    End of Session Equipment Utilized During Treatment: Rolling walker  OT Visit Diagnosis: Unsteadiness on feet (R26.81);Muscle weakness (generalized) (M62.81);Other  symptoms and signs involving the nervous system (R29.898)   Activity Tolerance Patient tolerated treatment well   Patient Left in bed;with call bell/phone within reach   Nurse Communication Mobility status        Time: 7510-2585 OT Time Calculation (min): 19 min  Charges: OT General Charges $OT Visit: 1 Visit OT Treatments $Self Care/Home Management : 8-22 mins  Eber Jones OTR/L Acute Rehabilitation Services Pager [phone] Office [phone]    Jeani Hawking M 09/19/2019, 11:12 AM

## 2019-09-19 NOTE — Progress Notes (Signed)
[facility]  PROGRESS NOTE  [name] YQI:[id number] DOB: [birth year] DOA: 09/17/2019 PCP: System, Pcp Not In   Brief HPI:   86 year old female with past medical history of hypertension, acid reflux, CKD stage IIIb who presented with left-sided numbness.  Symptoms are more in the left arm than the left leg.  Also felt weakness of the arm.  Denies difficulty speaking.  In the ED code stroke was called patient was seen by tele neurology she was not considered a TPA candidate as her symptoms which had improved as well recent episode of rectal bleeding    Subjective   Patient seen and examined, complains of cramping in left upper quadrant.  Still having intermittent rectal bleeding.  Hemoglobin is stable at 10.02   Assessment/Plan:     1. Left arm weakness-stroke has been ruled out.  MRI brain is negative.  MRI cervical spine showed mild right foraminal encroachment and moderate was severe left foraminal encroachment due to spurring.  C5-6 shows spinal stenosis most severe on the left long with progressive left foraminal stenosis.  C6-7 progressive left foraminal stenosis along with mild spinal stenosis and moderate foraminal stenosis bilaterally.  Neurology recommends outpatient PT.  No further medication recommended. 2. Chronic GI bleed-she has history of diverticulosis.  She was last seen by [facility] gastroenterology and colonoscopy was attempted in 27 which could not be done due to significant diverticulosis.  Her hemoglobin has dropped to 10.0.  I called and discussed with [facility] Dr. Lavonia Drafts who recommended to monitor hemoglobin.  I called and discussed with Dr. Evette Cristal who is on-call for [facility], as patient is still having rectal bleeding.  Dr. Evette Cristal recommends to monitor and consider nuclear RBC tagged scan if patient continues to bleed.  No endoscopic evaluation recommended at this time.  Will monitor.  3. CKD stage IIIb-at baseline 4. Hypertension-stroke has been ruled out.   Metoprolol has been restarted.  Will also restart losartan.   Scheduled medications:   . famotidine  20 mg Oral BID  . metoprolol tartrate  12.5 mg Oral BID       CBG: Recent Labs  Lab 09/17/19 0939 09/18/19 2111 09/19/19 0606 09/19/19 1140  GLUCAP 95 93 77 89    SpO2: 100 %    CBC: Recent Labs  Lab 09/17/19 0853 09/17/19 0940 09/18/19 0437 09/19/19 0353  WBC 3.5*  --   --  3.1*  NEUTROABS 2.1  --   --   --   HGB 11.6* 12.2 10.0* 10.2*  HCT 36.4 36.0 31.2* 32.6*  MCV 97.3  --   --  95.0  PLT 211  --   --  201    Basic Metabolic Panel: Recent Labs  Lab 09/17/19 0853 09/17/19 0940 09/19/19 0353  NA 140 142 138  K 4.2 4.4 4.0  CL 104 104 106  CO2 27  --  26  GLUCOSE 97 93 87  BUN 19 20 17   CREATININE 1.36* 1.40* 1.19*  CALCIUM 9.2  --  8.7*     Liver Function Tests: Recent Labs  Lab 09/17/19 0853  AST 19  ALT 11  ALKPHOS 51  BILITOT 0.4  PROT 7.2  ALBUMIN 4.2     Antibiotics: Anti-infectives (From admission, onward)   None       DVT prophylaxis: Lovenox  Code Status: Full code  Family Communication: No family at bedside    Status is: Inpatient  Dispo: The patient is from: Home  Anticipated d/c is to: Home              Anticipated d/c date is: 09/21/2019              Patient currently not medically stable for discharge  Barrier to discharge-rectal bleeding      Consultants:  Neurology  Procedures:     Objective   Vitals:   09/18/19 2206 09/18/19 2315 09/19/19 0316 09/19/19 0751  BP:  (!) 169/69 133/63 (!) 162/71  Pulse: 60 (!) 53 (!) 51 (!) 56  Resp:  10 11 15   Temp:  98.3 F (36.8 C) 98.1 F (36.7 C) 98.3 F (36.8 C)  TempSrc:  Oral Oral Oral  SpO2:  99% 99% 100%  Weight:      Height:       No intake or output data in the 24 hours ending 09/19/19 1153  08/05 1901 - 08/07 0700 In: 240 [P.O.:240] Out: -   Filed Weights   09/17/19 0932  Weight: 92.1 kg    Physical  Examination:   General-appears in no acute distress Heart-S1-S2, regular, no murmur auscultated Lungs-clear to auscultation bilaterally, no wheezing or crackles auscultated Abdomen-soft, mild tenderness noted in left upper quadrant, no rigidity or guarding Extremities-no edema in the lower extremities Neuro-alert, oriented x3, no focal deficit noted   Data Reviewed:   Recent Results (from the past 240 hour(s))  SARS Coronavirus 2 by RT PCR (hospital order, performed in [facility] hospital lab) Nasopharyngeal Nasopharyngeal Swab     Status: None   Collection Time: 09/17/19 11:11 AM   Specimen: Nasopharyngeal Swab  Result Value Ref Range Status   SARS Coronavirus 2 NEGATIVE NEGATIVE Final    Comment: (NOTE) SARS-CoV-2 target nucleic acids are NOT DETECTED.  The SARS-CoV-2 RNA is generally detectable in upper and lower respiratory specimens during the acute phase of infection. The lowest concentration of SARS-CoV-2 viral copies this assay can detect is 250 copies / mL. A negative result does not preclude SARS-CoV-2 infection and should not be used as the sole basis for treatment or other patient management decisions.  A negative result may occur with improper specimen collection / handling, submission of specimen other than nasopharyngeal swab, presence of viral mutation(s) within the areas targeted by this assay, and inadequate number of viral copies (<250 copies / mL). A negative result must be combined with clinical observations, patient history, and epidemiological information.  Fact Sheet for Patients:   11/17/19  Fact Sheet for Healthcare Providers: BoilerBrush.com.cy  This test is not yet approved or  cleared by the https://pope.com/ FDA and has been authorized for detection and/or diagnosis of SARS-CoV-2 by FDA under an Emergency Use Authorization (EUA).  This EUA will remain in effect (meaning this test can be used)  for the duration of the COVID-19 declaration under Section 564(b)(1) of the Act, 21 U.S.C. section 360bbb-3(b)(1), unless the authorization is terminated or revoked sooner.  Performed at [facility], [address] [place], [place]       Studies:  MR ANGIO HEAD WO CONTRAST  Result Date: 09/17/2019 CLINICAL DATA:  Left arm numbness, code stroke follow-up EXAM: MRI HEAD WITHOUT CONTRAST MRA HEAD WITHOUT CONTRAST TECHNIQUE: Multiplanar, multiecho pulse sequences of the brain and surrounding structures were obtained without intravenous contrast. Angiographic images of the head were obtained using MRA technique without contrast. COMPARISON:  None. FINDINGS: MRI HEAD Brain: There is no acute infarction or intracranial hemorrhage. There is no intracranial mass, mass  effect, or edema. There is no hydrocephalus or extra-axial fluid collection. Ventricles and sulci are within normal limits in size and configuration for age. Patchy T2 hyperintensity in the supratentorial white matter is nonspecific but may reflect minor chronic microvascular ischemic changes. Vascular: Major vessel flow voids at the skull base are preserved. Skull and upper cervical spine: Normal marrow signal is preserved. Sinuses/Orbits: Paranasal sinuses are aerated. Orbits are unremarkable. Other: Sella is unremarkable.  Mastoid air cells are clear. MRA HEAD Intracranial internal carotid arteries are patent with atherosclerotic irregularity. Middle and anterior cerebral arteries are patent. Intracranial vertebral arteries, basilar artery, posterior cerebral arteries are patent. There is no significant stenosis or aneurysm. IMPRESSION: No evidence of recent infarction, hemorrhage, or mass. Minor chronic microvascular ischemic changes. No proximal intracranial vessel occlusion. Electronically Signed   By: Guadlupe Spanish M.D.   On: 09/17/2019 16:03   MR BRAIN WO CONTRAST  Result Date: 09/17/2019 CLINICAL DATA:   Left arm numbness, code stroke follow-up EXAM: MRI HEAD WITHOUT CONTRAST MRA HEAD WITHOUT CONTRAST TECHNIQUE: Multiplanar, multiecho pulse sequences of the brain and surrounding structures were obtained without intravenous contrast. Angiographic images of the head were obtained using MRA technique without contrast. COMPARISON:  None. FINDINGS: MRI HEAD Brain: There is no acute infarction or intracranial hemorrhage. There is no intracranial mass, mass effect, or edema. There is no hydrocephalus or extra-axial fluid collection. Ventricles and sulci are within normal limits in size and configuration for age. Patchy T2 hyperintensity in the supratentorial white matter is nonspecific but may reflect minor chronic microvascular ischemic changes. Vascular: Major vessel flow voids at the skull base are preserved. Skull and upper cervical spine: Normal marrow signal is preserved. Sinuses/Orbits: Paranasal sinuses are aerated. Orbits are unremarkable. Other: Sella is unremarkable.  Mastoid air cells are clear. MRA HEAD Intracranial internal carotid arteries are patent with atherosclerotic irregularity. Middle and anterior cerebral arteries are patent. Intracranial vertebral arteries, basilar artery, posterior cerebral arteries are patent. There is no significant stenosis or aneurysm. IMPRESSION: No evidence of recent infarction, hemorrhage, or mass. Minor chronic microvascular ischemic changes. No proximal intracranial vessel occlusion. Electronically Signed   By: Guadlupe Spanish M.D.   On: 09/17/2019 16:03   ECHOCARDIOGRAM COMPLETE  Result Date: 09/18/2019    ECHOCARDIOGRAM REPORT   Patient Name:   [name] Date of Exam: 09/18/2019 Medical Rec #:  [id number]      Height:       63.0 in Accession #:    [id number]     Weight:       203.0 lb Date of Birth:  Apr 19, 1923      BSA:          1.946 m Patient Age:    89 years       BP:           158/59 mmHg Patient Gender: F              HR:           56 bpm. Exam Location:   Inpatient Procedure: 2D Echo Indications:    Stroke 434.91 / I163.9  History:        Patient has prior history of Echocardiogram examinations, most                 recent 09/30/2017. Aortic valve disease. Risk factors:                 Hypertension. Dyslipidemia.  Sonographer:    Leta Jungling RDCS Referring  Phys: [id number] Osvaldo ShipperGOKUL KRISHNAN IMPRESSIONS  1. Left ventricular ejection fraction, by estimation, is 60 to 65%. The left ventricle has normal function. The left ventricle has no regional wall motion abnormalities. There is mild left ventricular hypertrophy. Left ventricular diastolic parameters are consistent with Grade I diastolic dysfunction (impaired relaxation). Elevated left atrial pressure.  2. Right ventricular systolic function is normal. The right ventricular size is normal. There is moderately elevated pulmonary artery systolic pressure.  3. Left atrial size was moderately dilated.  4. Right atrial size was mildly dilated.  5. The mitral valve is normal in structure. Mild mitral valve regurgitation. No evidence of mitral stenosis.  6. The aortic valve is tricuspid. Aortic valve regurgitation is not visualized. Mild aortic valve stenosis.  7. The inferior vena cava is normal in size with greater than 50% respiratory variability, suggesting right atrial pressure of 3 mmHg. FINDINGS  Left Ventricle: Left ventricular ejection fraction, by estimation, is 60 to 65%. The left ventricle has normal function. The left ventricle has no regional wall motion abnormalities. The left ventricular internal cavity size was normal in size. There is  mild left ventricular hypertrophy. Left ventricular diastolic parameters are consistent with Grade I diastolic dysfunction (impaired relaxation). Elevated left atrial pressure. Right Ventricle: The right ventricular size is normal.Right ventricular systolic function is normal. There is moderately elevated pulmonary artery systolic pressure. The tricuspid regurgitant velocity is  3.52 m/s, and with an assumed right atrial pressure of 3 mmHg, the estimated right ventricular systolic pressure is 52.6 mmHg. Left Atrium: Left atrial size was moderately dilated. Right Atrium: Right atrial size was mildly dilated. Pericardium: There is no evidence of pericardial effusion. Mitral Valve: The mitral valve is normal in structure. Normal mobility of the mitral valve leaflets. Mild mitral annular calcification. Mild mitral valve regurgitation. No evidence of mitral valve stenosis. Tricuspid Valve: The tricuspid valve is normal in structure. Tricuspid valve regurgitation is mild . No evidence of tricuspid stenosis. Aortic Valve: The aortic valve is tricuspid. Aortic valve regurgitation is not visualized. Mild aortic stenosis is present. Aortic valve mean gradient measures 13.2 mmHg. Aortic valve peak gradient measures 24.0 mmHg. Aortic valve area, by VTI measures 1.08 cm. Pulmonic Valve: The pulmonic valve was not well visualized. Pulmonic valve regurgitation is not visualized. No evidence of pulmonic stenosis. Aorta: The aortic root is normal in size and structure. Venous: The inferior vena cava is normal in size with greater than 50% respiratory variability, suggesting right atrial pressure of 3 mmHg. IAS/Shunts: No atrial level shunt detected by color flow Doppler.  LEFT VENTRICLE PLAX 2D LVIDd:         4.20 cm  Diastology LVIDs:         2.60 cm  LV e' lateral:   5.22 cm/s LV PW:         1.20 cm  LV E/e' lateral: 25.3 LV IVS:        1.40 cm  LV e' medial:    4.90 cm/s LVOT diam:     1.80 cm  LV E/e' medial:  26.9 LV SV:         66 LV SV Index:   34 LVOT Area:     2.54 cm  RIGHT VENTRICLE RV S prime:     11.30 cm/s TAPSE (M-mode): 3.4 cm LEFT ATRIUM             Index       RIGHT ATRIUM  Index LA diam:        4.30 cm 2.21 cm/m  RA Area:     21.20 cm LA Vol (A2C):   83.2 ml 42.76 ml/m RA Volume:   59.00 ml  30.32 ml/m LA Vol (A4C):   91.5 ml 47.00 ml/m LA Biplane Vol: 81.6 ml 41.93 ml/m   AORTIC VALVE AV Area (Vmax):    1.00 cm AV Area (Vmean):   1.01 cm AV Area (VTI):     1.08 cm AV Vmax:           245.00 cm/s AV Vmean:          170.800 cm/s AV VTI:            0.608 m AV Peak Grad:      24.0 mmHg AV Mean Grad:      13.2 mmHg LVOT Vmax:         95.80 cm/s LVOT Vmean:        67.600 cm/s LVOT VTI:          0.259 m LVOT/AV VTI ratio: 0.43  AORTA Ao Root diam: 3.30 cm MITRAL VALVE                TRICUSPID VALVE MV Area (PHT): 4.31 cm     TR Peak grad:   49.6 mmHg MV Decel Time: 176 msec     TR Vmax:        352.00 cm/s MV E velocity: 132.00 cm/s MV A velocity: 124.00 cm/s  SHUNTS MV E/A ratio:  1.06         Systemic VTI:  0.26 m                             Systemic Diam: 1.80 cm Olga Millers MD Electronically signed by Olga Millers MD Signature Date/Time: 09/18/2019/12:02:15 PM    Final    VAS US CAROTID (at [facility] and [facility] only)  Result Date: 09/18/2019 Carotid Arterial Duplex Study Indications:       CVA. Risk Factors:      Hypertension. Comparison Study:  No prior studies. Performing Technologist: Chanda Busing RVT  Examination Guidelines: A complete evaluation includes B-mode imaging, spectral Doppler, color Doppler, and power Doppler as needed of all accessible portions of each vessel. Bilateral testing is considered an integral part of a complete examination. Limited examinations for reoccurring indications may be performed as noted.  Right Carotid Findings: +----------+--------+--------+--------+-----------------------+--------+           PSV cm/sEDV cm/sStenosisPlaque Description     Comments +----------+--------+--------+--------+-----------------------+--------+ CCA Prox  63      7               smooth and heterogenoustortuous +----------+--------+--------+--------+-----------------------+--------+ CCA Distal51      12              smooth and heterogenous         +----------+--------+--------+--------+-----------------------+--------+ ICA Prox  38      10               smooth and heterogenous         +----------+--------+--------+--------+-----------------------+--------+ ICA Distal58      13                                     tortuous +----------+--------+--------+--------+-----------------------+--------+ ECA       43  2                                               +----------+--------+--------+--------+-----------------------+--------+ +----------+--------+-------+--------+-------------------+           PSV cm/sEDV cmsDescribeArm Pressure (mmHG) +----------+--------+-------+--------+-------------------+ [id number]                                         +----------+--------+-------+--------+-------------------+ +---------+--------+--+--------+--+---------+ VertebralPSV cm/s56EDV cm/s15Antegrade +---------+--------+--+--------+--+---------+  Left Carotid Findings: +----------+--------+--------+--------+-----------------------+--------+           PSV cm/sEDV cm/sStenosisPlaque Description     Comments +----------+--------+--------+--------+-----------------------+--------+ CCA Prox  59      7               smooth and heterogenoustortuous +----------+--------+--------+--------+-----------------------+--------+ CCA Distal39      9               smooth and heterogenous         +----------+--------+--------+--------+-----------------------+--------+ ICA Prox  41      9               smooth and heterogenous         +----------+--------+--------+--------+-----------------------+--------+ ICA Distal105     23                                     tortuous +----------+--------+--------+--------+-----------------------+--------+ ECA       29      2                                               +----------+--------+--------+--------+-----------------------+--------+ +----------+--------+--------+--------+-------------------+           PSV cm/sEDV cm/sDescribeArm Pressure (mmHG)  +----------+--------+--------+--------+-------------------+ Subclavian101                                         +----------+--------+--------+--------+-------------------+ +---------+--------+--+--------+-+---------+ VertebralPSV cm/s42EDV cm/s6Antegrade +---------+--------+--+--------+-+---------+   Summary: Right Carotid: Velocities in the right ICA are consistent with a 1-39% stenosis. Left Carotid: Velocities in the left ICA are consistent with a 1-39% stenosis. Vertebrals: Bilateral vertebral arteries demonstrate antegrade flow. *See table(s) above for measurements and observations.  Electronically signed by Delia Heady MD on 09/18/2019 at 5:09:33 PM.    Final        Meredeth Ide   [facility] If 7PM-7AM, please contact night-coverage at [email or web address], Office  [phone]   09/19/2019, 11:53 AM  LOS: 2 days

## 2019-09-19 NOTE — Progress Notes (Signed)
NT assisted patient to the bathroom where she had a BM with bright red blood noted.

## 2019-09-20 LAB — GLUCOSE, CAPILLARY: Glucose-Capillary: 90 mg/dL (ref 70–99)

## 2019-09-20 LAB — CBC
HCT: 30.5 % — ABNORMAL LOW (ref 36.0–46.0)
Hemoglobin: 9.8 g/dL — ABNORMAL LOW (ref 12.0–15.0)
MCH: 30.8 pg (ref 26.0–34.0)
MCHC: 32.1 g/dL (ref 30.0–36.0)
MCV: 95.9 fL (ref 80.0–100.0)
Platelets: 214 10*3/uL (ref 150–400)
RBC: 3.18 MIL/uL — ABNORMAL LOW (ref 3.87–5.11)
RDW: 14.6 % (ref 11.5–15.5)
WBC: 3 10*3/uL — ABNORMAL LOW (ref 4.0–10.5)
nRBC: 0 % (ref 0.0–0.2)

## 2019-09-20 NOTE — Plan of Care (Signed)

## 2019-09-20 NOTE — Progress Notes (Signed)
Physical Therapy Treatment Patient Details Name: [name] MRN: [id number] DOB: 1923-07-16 Today's Date: 09/20/2019    History of Present Illness Pt is  86 y/o female who presents with numbness and weakness in the L side. MRI negative for acute changes. Pt was found with chronic GI bleed in the setting of diverticulosis. PMH significant for HTN, CKD III.     PT Comments    Pt progressing towards physical therapy goals. She continues to feel that she is very near her baseline of function. Was able to perform transfers with modified independence, and ambulation with light supervision for safety with a RW for support.  Pt has a standard walker and will need RW for home. She anticipates d/c home tomorrow and reports she can have assistance at home if needed. Encouraged pt to go ahead and have family lined up to stay with her at least the first day or 2. I don't necessarily feel like the pt needs 24 hour support, but at least during her waking hours. Will continue to follow and progress as able per POC.    Follow Up Recommendations  No PT follow up     Equipment Recommendations  Rolling Walker   Recommendations for Other Services       Precautions / Restrictions Precautions Precautions: None Precaution Comments: Mod fall risk Restrictions Weight Bearing Restrictions: No    Mobility  Bed Mobility Overal bed mobility: Modified Independent Bed Mobility: Supine to Sit;Sit to Supine           General bed mobility comments: HOB elevated to sit, HOB flat to return to supine. Pt was able to negotiate without assistance.  Transfers Overall transfer level: Modified independent Equipment used: Rolling walker (2 wheeled) Transfers: Sit to/from [employer] transfer comment: Pt was able to power-up to full standing position without assistance. Good hand placement on seated surface for safety.   Ambulation/Gait Ambulation/Gait assistance:  Supervision Gait Distance (Feet): 250 Feet Assistive device: Rolling walker (2 wheeled) Gait Pattern/deviations: Step-through pattern;Decreased stride length;Trunk flexed Gait velocity: Age appropriate.  Gait velocity interpretation: 1.31 - 2.62 ft/sec, indicative of limited community ambulator General Gait Details: Gait speed likely [facility] for age. Steady with RW for support. Pt reporting she feels more comfortable with the walker right now and would prefer to use it than the cane. No unsteadiness or LOB noted throughout.    Stairs             Wheelchair Mobility    Modified Rankin (Stroke Patients Only) Modified Rankin (Stroke Patients Only) Pre-Morbid Rankin Score: No symptoms Modified Rankin: Moderately severe disability     Balance Overall balance assessment: Needs assistance Sitting-balance support: No upper extremity supported;Feet supported Sitting balance-Leahy Scale: Good     Standing balance support: No upper extremity supported;During functional activity Standing balance-Leahy Scale: Fair Standing balance comment: statically. Reliant on UE support for dynamic tasks                            Cognition Arousal/Alertness: Awake/alert Behavior During Therapy: WFL for tasks assessed/performed Overall Cognitive Status: Within Functional Limits for tasks assessed                                        Exercises      General Comments  Pertinent Vitals/Pain Pain Assessment: No/denies pain    Home Living                      Prior Function            PT Goals (current goals can now be found in the care plan section) Acute Rehab PT Goals Patient Stated Goal: be safe at home - home tomorrow PT Goal Formulation: With patient Time For Goal Achievement: 09/25/19 Potential to Achieve Goals: Good Progress towards PT goals: Progressing toward goals    Frequency    Min 3X/week      PT Plan Current plan  remains appropriate    Co-evaluation              AM-PAC PT "6 Clicks" Mobility   Outcome Measure  Help needed turning from your back to your side while in a flat bed without using bedrails?: None Help needed moving from lying on your back to sitting on the side of a flat bed without using bedrails?: None Help needed moving to and from a bed to a chair (including a wheelchair)?: None Help needed standing up from a chair using your arms (e.g., wheelchair or bedside chair)?: None Help needed to walk in hospital room?: None Help needed climbing 3-5 steps with a railing? : A Little 6 Click Score: 23    End of Session Equipment Utilized During Treatment: Gait belt Activity Tolerance: Patient tolerated treatment well Patient left: Other (comment) (Seated in chair by the sink with OT present) Nurse Communication: Mobility status PT Visit Diagnosis: Unsteadiness on feet (R26.81);Other symptoms and signs involving the nervous system (W73.710)     Time: 6269-4854 PT Time Calculation (min) (ACUTE ONLY): 16 min  Charges:  $Gait Training: 8-22 mins                     [name], PT, DPT Acute Rehabilitation Services Pager: [phone] Office: [phone]    [name] 09/20/2019, 1:54 PM

## 2019-09-20 NOTE — Progress Notes (Signed)
[facility]  PROGRESS NOTE  VELERIA BARNHARDT WNI:[id number] DOB: Mar 29, 1923 DOA: 09/17/2019 PCP: System, Pcp Not In   Brief HPI:   86 year old female with past medical history of hypertension, acid reflux, CKD stage IIIb who presented with left-sided numbness.  Symptoms are more in the left arm than the left leg.  Also felt weakness of the arm.  Denies difficulty speaking.  In the ED code stroke was called patient was seen by tele neurology she was not considered a TPA candidate as her symptoms which had improved as well recent episode of rectal bleeding    Subjective   Patient seen, denies any blood per rectum last night.  Abdominal cramping has improved.  Hemoglobin is stable at 9.8.   Assessment/Plan:     1. Left arm weakness-stroke has been ruled out.  MRI brain is negative.  MRI cervical spine showed mild right foraminal encroachment and moderate was severe left foraminal encroachment due to spurring.  C5-6 shows spinal stenosis most severe on the left long with progressive left foraminal stenosis.  C6-7 progressive left foraminal stenosis along with mild spinal stenosis and moderate foraminal stenosis bilaterally.  Neurology recommends outpatient PT.  No further medication recommended. 2. Chronic GI bleed-she has history of diverticulosis.  She was last seen by [facility] gastroenterology and colonoscopy was attempted in 27 which could not be done due to significant diverticulosis.  Her hemoglobin has dropped to 9.8.  I called and discussed with [facility] Dr. Lavonia Drafts who recommended to monitor hemoglobin.  I called and discussed with Dr. Evette Cristal who is on-call for [facility], as patient is still having rectal bleeding.  Dr. Evette Cristal recommends to monitor and consider nuclear RBC tagged scan if patient continues to bleed.  No endoscopic evaluation recommended at this time.  Will monitor.  3. CKD stage IIIb-at baseline 4. Hypertension-stroke has been ruled out.  Metoprolol has been restarted.  Will  also restart losartan.   Scheduled medications:   . famotidine  20 mg Oral BID  . isosorbide mononitrate  90 mg Oral Daily  . losartan  25 mg Oral Daily  . metoprolol tartrate  12.5 mg Oral BID       CBG: Recent Labs  Lab 09/18/19 2111 09/19/19 0606 09/19/19 1140 09/19/19 1637 09/19/19 2104  GLUCAP 93 77 89 85 114*    SpO2: 95 %    CBC: Recent Labs  Lab 09/17/19 0853 09/17/19 0940 09/18/19 0437 09/19/19 0353 09/20/19 0309  WBC 3.5*  --   --  3.1* 3.0*  NEUTROABS 2.1  --   --   --   --   HGB 11.6* 12.2 10.0* 10.2* 9.8*  HCT 36.4 36.0 31.2* 32.6* 30.5*  MCV 97.3  --   --  95.0 95.9  PLT 211  --   --  201 214    Basic Metabolic Panel: Recent Labs  Lab 09/17/19 0853 09/17/19 0940 09/19/19 0353  NA 140 142 138  K 4.2 4.4 4.0  CL 104 104 106  CO2 27  --  26  GLUCOSE 97 93 87  BUN 19 20 17   CREATININE 1.36* 1.40* 1.19*  CALCIUM 9.2  --  8.7*     Liver Function Tests: Recent Labs  Lab 09/17/19 0853  AST 19  ALT 11  ALKPHOS 51  BILITOT 0.4  PROT 7.2  ALBUMIN 4.2     Antibiotics: Anti-infectives (From admission, onward)   None       DVT prophylaxis: Lovenox  Code Status:  Full code  Family Communication: No family at bedside    Status is: Inpatient  Dispo: The patient is from: Home              Anticipated d/c is to: Home              Anticipated d/c date is: 09/21/2019              Patient currently not medically stable for discharge  Barrier to discharge-rectal bleeding      Consultants:  Neurology  Procedures:     Objective   Vitals:   09/19/19 2333 09/20/19 0315 09/20/19 0340 09/20/19 0715  BP: 134/66 (!) 115/57  (!) 143/67  Pulse: 60 63  (!) 58  Resp: Temp: 98.9 F (37.2 C) 99.5 F (37.5 C) 98.3 F (36.8 C) 97.8 F (36.6 C)  TempSrc: Oral Oral Oral Oral  SpO2: 98% 98%  95%  Weight:   91.3 kg   Height:        Intake/Output Summary (Last 24 hours) at 09/20/2019 0931 Last data filed at  09/20/2019 0900 Gross per 24 hour  Intake 240 ml  Output --  Net 240 ml    No intake/output data recorded.  Filed Weights   09/17/19 0932 09/20/19 0340  Weight: 92.1 kg 91.3 kg    Physical Examination:   General-appears in no acute distress Heart-S1-S2, regular, no murmur auscultated Lungs-clear to auscultation bilaterally, no wheezing or crackles auscultated Abdomen-soft, nontender, no organomegaly Extremities-no edema in the lower extremities Neuro-alert, oriented x3, no focal deficit noted  Data Reviewed:   Recent Results (from the past 240 hour(s))  SARS Coronavirus 2 by RT PCR (hospital order, performed in [facility] hospital lab) Nasopharyngeal Nasopharyngeal Swab     Status: None   Collection Time: 09/17/19 11:11 AM   Specimen: Nasopharyngeal Swab  Result Value Ref Range Status   SARS Coronavirus 2 NEGATIVE NEGATIVE Final    Comment: (NOTE) SARS-CoV-2 target nucleic acids are NOT DETECTED.  The SARS-CoV-2 RNA is generally detectable in upper and lower respiratory specimens during the acute phase of infection. The lowest concentration of SARS-CoV-2 viral copies this assay can detect is 250 copies / mL. A negative result does not preclude SARS-CoV-2 infection and should not be used as the sole basis for treatment or other patient management decisions.  A negative result may occur with improper specimen collection / handling, submission of specimen other than nasopharyngeal swab, presence of viral mutation(s) within the areas targeted by this assay, and inadequate number of viral copies (<250 copies / mL). A negative result must be combined with clinical observations, patient history, and epidemiological information.  Fact Sheet for Patients:   BoilerBrush.com.cy  Fact Sheet for Healthcare Providers: https://pope.com/  This test is not yet approved or  cleared by the [place] FDA and has been authorized for  detection and/or diagnosis of SARS-CoV-2 by FDA under an Emergency Use Authorization (EUA).  This EUA will remain in effect (meaning this test can be used) for the duration of the COVID-19 declaration under Section 564(b)(1) of the Act, 21 U.S.C. section 360bbb-3(b)(1), unless the authorization is terminated or revoked sooner.  Performed at [facility], [address], [place], [place] [address]       Studies:  ECHOCARDIOGRAM COMPLETE  Result Date: 09/18/2019    ECHOCARDIOGRAM REPORT   Patient Name:   KEM PARCHER Date of Exam: 09/18/2019 Medical Rec #:  [id number]  Height:       63.0 in Accession #:    [id number]     Weight:       203.0 lb Date of Birth:  05/29/1923      BSA:          1.946 m Patient Age:    89 years       BP:           158/59 mmHg Patient Gender: F              HR:           56 bpm. Exam Location:  Inpatient Procedure: 2D Echo Indications:    Stroke 434.91 / I163.9  History:        Patient has prior history of Echocardiogram examinations, most                 recent 09/30/2017. Aortic valve disease. Risk factors:                 Hypertension. Dyslipidemia.  Sonographer:    Leta Jungling RDCS Referring Phys: [phone] [facility] IMPRESSIONS  1. Left ventricular ejection fraction, by estimation, is 60 to 65%. The left ventricle has normal function. The left ventricle has no regional wall motion abnormalities. There is mild left ventricular hypertrophy. Left ventricular diastolic parameters are consistent with Grade I diastolic dysfunction (impaired relaxation). Elevated left atrial pressure.  2. Right ventricular systolic function is normal. The right ventricular size is normal. There is moderately elevated pulmonary artery systolic pressure.  3. Left atrial size was moderately dilated.  4. Right atrial size was mildly dilated.  5. The mitral valve is normal in structure. Mild mitral valve regurgitation. No evidence of mitral stenosis.  6. The aortic valve is  tricuspid. Aortic valve regurgitation is not visualized. Mild aortic valve stenosis.  7. The inferior vena cava is normal in size with greater than 50% respiratory variability, suggesting right atrial pressure of 3 mmHg. FINDINGS  Left Ventricle: Left ventricular ejection fraction, by estimation, is 60 to 65%. The left ventricle has normal function. The left ventricle has no regional wall motion abnormalities. The left ventricular internal cavity size was normal in size. There is  mild left ventricular hypertrophy. Left ventricular diastolic parameters are consistent with Grade I diastolic dysfunction (impaired relaxation). Elevated left atrial pressure. Right Ventricle: The right ventricular size is normal.Right ventricular systolic function is normal. There is moderately elevated pulmonary artery systolic pressure. The tricuspid regurgitant velocity is 3.52 m/s, and with an assumed right atrial pressure of 3 mmHg, the estimated right ventricular systolic pressure is 52.6 mmHg. Left Atrium: Left atrial size was moderately dilated. Right Atrium: Right atrial size was mildly dilated. Pericardium: There is no evidence of pericardial effusion. Mitral Valve: The mitral valve is normal in structure. Normal mobility of the mitral valve leaflets. Mild mitral annular calcification. Mild mitral valve regurgitation. No evidence of mitral valve stenosis. Tricuspid Valve: The tricuspid valve is normal in structure. Tricuspid valve regurgitation is mild . No evidence of tricuspid stenosis. Aortic Valve: The aortic valve is tricuspid. Aortic valve regurgitation is not visualized. Mild aortic stenosis is present. Aortic valve mean gradient measures 13.2 mmHg. Aortic valve peak gradient measures 24.0 mmHg. Aortic valve area, by VTI measures 1.08 cm. Pulmonic Valve: The pulmonic valve was not well visualized. Pulmonic valve regurgitation is not visualized. No evidence of pulmonic stenosis. Aorta: The aortic root is normal in size  and structure. Venous: The inferior vena cava is  normal in size with greater than 50% respiratory variability, suggesting right atrial pressure of 3 mmHg. IAS/Shunts: No atrial level shunt detected by color flow Doppler.  LEFT VENTRICLE PLAX 2D LVIDd:         4.20 cm  Diastology LVIDs:         2.60 cm  LV e' lateral:   5.22 cm/s LV PW:         1.20 cm  LV E/e' lateral: 25.3 LV IVS:        1.40 cm  LV e' medial:    4.90 cm/s LVOT diam:     1.80 cm  LV E/e' medial:  26.9 LV SV:         66 LV SV Index:   34 LVOT Area:     2.54 cm  RIGHT VENTRICLE RV S prime:     11.30 cm/s TAPSE (M-mode): 3.4 cm LEFT ATRIUM             Index       RIGHT ATRIUM           Index LA diam:        4.30 cm 2.21 cm/m  RA Area:     21.20 cm LA Vol (A2C):   83.2 ml 42.76 ml/m RA Volume:   59.00 ml  30.32 ml/m LA Vol (A4C):   91.5 ml 47.00 ml/m LA Biplane Vol: 81.6 ml 41.93 ml/m  AORTIC VALVE AV Area (Vmax):    1.00 cm AV Area (Vmean):   1.01 cm AV Area (VTI):     1.08 cm AV Vmax:           245.00 cm/s AV Vmean:          170.800 cm/s AV VTI:            0.608 m AV Peak Grad:      24.0 mmHg AV Mean Grad:      13.2 mmHg LVOT Vmax:         95.80 cm/s LVOT Vmean:        67.600 cm/s LVOT VTI:          0.259 m LVOT/AV VTI ratio: 0.43  AORTA Ao Root diam: 3.30 cm MITRAL VALVE                TRICUSPID VALVE MV Area (PHT): 4.31 cm     TR Peak grad:   49.6 mmHg MV Decel Time: 176 msec     TR Vmax:        352.00 cm/s MV E velocity: 132.00 cm/s MV A velocity: 124.00 cm/s  SHUNTS MV E/A ratio:  1.06         Systemic VTI:  0.26 m                             Systemic Diam: 1.80 cm Olga Millers MD Electronically signed by Olga Millers MD Signature Date/Time: 09/18/2019/12:02:15 PM    Final    VAS US CAROTID (at [facility] and [facility] only)  Result Date: 09/18/2019 Carotid Arterial Duplex Study Indications:       CVA. Risk Factors:      Hypertension. Comparison Study:  No prior studies. Performing Technologist: Chanda Busing RVT  Examination Guidelines: A  complete evaluation includes B-mode imaging, spectral Doppler, color Doppler, and power Doppler as needed of all accessible portions of each vessel. Bilateral testing is considered an integral part of a complete examination. Limited examinations  for reoccurring indications may be performed as noted.  Right Carotid Findings: +----------+--------+--------+--------+-----------------------+--------+           PSV cm/sEDV cm/sStenosisPlaque Description     Comments +----------+--------+--------+--------+-----------------------+--------+ CCA Prox  63      7               smooth and heterogenoustortuous +----------+--------+--------+--------+-----------------------+--------+ CCA Distal51      12              smooth and heterogenous         +----------+--------+--------+--------+-----------------------+--------+ ICA Prox  38      10              smooth and heterogenous         +----------+--------+--------+--------+-----------------------+--------+ ICA Distal58      13                                     tortuous +----------+--------+--------+--------+-----------------------+--------+ ECA       43      2                                               +----------+--------+--------+--------+-----------------------+--------+ +----------+--------+-------+--------+-------------------+           PSV cm/sEDV cmsDescribeArm Pressure (mmHG) +----------+--------+-------+--------+-------------------+ [id number]                                         +----------+--------+-------+--------+-------------------+ +---------+--------+--+--------+--+---------+ VertebralPSV cm/s56EDV cm/s15Antegrade +---------+--------+--+--------+--+---------+  Left Carotid Findings: +----------+--------+--------+--------+-----------------------+--------+           PSV cm/sEDV cm/sStenosisPlaque Description     Comments  +----------+--------+--------+--------+-----------------------+--------+ CCA Prox  59      7               smooth and heterogenoustortuous +----------+--------+--------+--------+-----------------------+--------+ CCA Distal39      9               smooth and heterogenous         +----------+--------+--------+--------+-----------------------+--------+ ICA Prox  41      9               smooth and heterogenous         +----------+--------+--------+--------+-----------------------+--------+ ICA Distal105     23                                     tortuous +----------+--------+--------+--------+-----------------------+--------+ ECA       29      2                                               +----------+--------+--------+--------+-----------------------+--------+ +----------+--------+--------+--------+-------------------+           PSV cm/sEDV cm/sDescribeArm Pressure (mmHG) +----------+--------+--------+--------+-------------------+ Subclavian101                                         +----------+--------+--------+--------+-------------------+ +---------+--------+--+--------+-+---------+ VertebralPSV cm/s42EDV cm/s6Antegrade +---------+--------+--+--------+-+---------+   Summary: Right Carotid: Velocities  in the right ICA are consistent with a 1-39% stenosis. Left Carotid: Velocities in the left ICA are consistent with a 1-39% stenosis. Vertebrals: Bilateral vertebral arteries demonstrate antegrade flow. *See table(s) above for measurements and observations.  Electronically signed by Delia HeadyPramod Sethi MD on 09/18/2019 at 5:09:33 PM.    Final        Meredeth IdeGagan S Severiano Utsey   [facility] If 7PM-7AM, please contact night-coverage at [email or web address], Office  [phone]   09/20/2019, 9:31 AM  LOS: 3 days

## 2019-09-20 NOTE — Progress Notes (Signed)
Spoke w/ Mendy (RR) about pt's BP, MAP, and HR and pt's scheduled metop. Mendy advised to hold med d/t HR.

## 2019-09-20 NOTE — Plan of Care (Signed)
  Problem: Education: Goal: Knowledge of General Education information will improve Description: Including pain rating scale, medication(s)/side effects and non-pharmacologic comfort measures Outcome: Progressing   Problem: Health Behavior/Discharge Planning: Goal: Ability to manage health-related needs will improve Outcome: Progressing   Problem: Clinical Measurements: Goal: Ability to maintain clinical measurements within normal limits will improve Outcome: Progressing   Problem: Activity: Goal: Risk for activity intolerance will decrease Outcome: Progressing   Problem: Coping: Goal: Level of anxiety will decrease Outcome: Progressing   Problem: Safety: Goal: Ability to remain free from injury will improve Outcome: Progressing   Problem: Skin Integrity: Goal: Risk for impaired skin integrity will decrease Outcome: Progressing   Problem: Education: Goal: Knowledge of disease or condition will improve Outcome: Progressing Goal: Knowledge of secondary prevention will improve Outcome: Progressing Goal: Knowledge of patient specific risk factors addressed and post discharge goals established will improve Outcome: Progressing Goal: Individualized Educational Video(s) Outcome: Progressing   Problem: Coping: Goal: Will verbalize positive feelings about self Outcome: Progressing   Problem: Health Behavior/Discharge Planning: Goal: Ability to manage health-related needs will improve Outcome: Progressing   Problem: Self-Care: Goal: Ability to participate in self-care as condition permits will improve Outcome: Progressing   Problem: Nutrition: Goal: Risk of aspiration will decrease Outcome: Progressing   Problem: Ischemic Stroke/TIA Tissue Perfusion: Goal: Complications of ischemic stroke/TIA will be minimized Outcome: Progressing

## 2019-09-20 NOTE — Progress Notes (Signed)
BP 98/63, HR 50. Rathore informed and nurse inquired about holding Metop 12.5 mg d/t HR.

## 2019-09-20 NOTE — Plan of Care (Signed)
  Problem: Education: Goal: Knowledge of General Education information will improve Description: Including pain rating scale, medication(s)/side effects and non-pharmacologic comfort measures 09/20/2019 0943 by Dallas Breeding, RN Outcome: Progressing 09/20/2019 0938 by Dallas Breeding, RN Outcome: Progressing   Problem: Coping: Goal: Level of anxiety will decrease 09/20/2019 0943 by Dallas Breeding, RN Outcome: Progressing 09/20/2019 0938 by Dallas Breeding, RN Outcome: Progressing   Problem: Elimination: Goal: Will not experience complications related to bowel motility 09/20/2019 0943 by Dallas Breeding, RN Outcome: Progressing 09/20/2019 0938 by Dallas Breeding, RN Outcome: Progressing   Problem: Safety: Goal: Ability to remain free from injury will improve 09/20/2019 0943 by Dallas Breeding, RN Outcome: Progressing 09/20/2019 0938 by Dallas Breeding, RN Outcome: Progressing   Problem: Activity: Goal: Risk for activity intolerance will decrease 09/20/2019 0943 by Dallas Breeding, RN Outcome: Progressing 09/20/2019 0938 by Dallas Breeding, RN Outcome: Progressing   Problem: Skin Integrity: Goal: Risk for impaired skin integrity will decrease 09/20/2019 0943 by Dallas Breeding, RN Outcome: Progressing 09/20/2019 0938 by Dallas Breeding, RN Outcome: Progressing

## 2019-09-20 NOTE — Progress Notes (Signed)
Rathore paged three time about pt's HR and scheduled metop. MD replied back to inform me that she will get back with me about this following priority calls. Will wait and conti to monitor my pts.

## 2019-09-21 LAB — CBC
HCT: 30.5 % — ABNORMAL LOW (ref 36.0–46.0)
Hemoglobin: 9.9 g/dL — ABNORMAL LOW (ref 12.0–15.0)
MCH: 31.3 pg (ref 26.0–34.0)
MCHC: 32.5 g/dL (ref 30.0–36.0)
MCV: 96.5 fL (ref 80.0–100.0)
Platelets: 217 10*3/uL (ref 150–400)
RBC: 3.16 MIL/uL — ABNORMAL LOW (ref 3.87–5.11)
RDW: 14.7 % (ref 11.5–15.5)
WBC: 3.1 10*3/uL — ABNORMAL LOW (ref 4.0–10.5)
nRBC: 0 % (ref 0.0–0.2)

## 2019-09-21 LAB — GLUCOSE, CAPILLARY: Glucose-Capillary: 98 mg/dL (ref 70–99)

## 2019-09-21 NOTE — Plan of Care (Signed)
  Problem: Education: Goal: Knowledge of General Education information will improve Description: Including pain rating scale, medication(s)/side effects and non-pharmacologic comfort measures Outcome: Progressing   Problem: Health Behavior/Discharge Planning: Goal: Ability to manage health-related needs will improve Outcome: Progressing   Problem: Clinical Measurements: Goal: Ability to maintain clinical measurements within normal limits will improve Outcome: Progressing   Problem: Activity: Goal: Risk for activity intolerance will decrease Outcome: Progressing   Problem: Coping: Goal: Level of anxiety will decrease Outcome: Progressing   Problem: Elimination: Goal: Will not experience complications related to bowel motility Outcome: Progressing   Problem: Safety: Goal: Ability to remain free from injury will improve Outcome: Progressing   Problem: Skin Integrity: Goal: Risk for impaired skin integrity will decrease Outcome: Progressing   Problem: Education: Goal: Knowledge of disease or condition will improve Outcome: Progressing Goal: Knowledge of secondary prevention will improve Outcome: Progressing Goal: Knowledge of patient specific risk factors addressed and post discharge goals established will improve Outcome: Progressing Goal: Individualized Educational Video(s) Outcome: Progressing   Problem: Coping: Goal: Will verbalize positive feelings about self Outcome: Progressing   Problem: Health Behavior/Discharge Planning: Goal: Ability to manage health-related needs will improve Outcome: Progressing   Problem: Self-Care: Goal: Ability to participate in self-care as condition permits will improve Outcome: Progressing   Problem: Nutrition: Goal: Risk of aspiration will decrease Outcome: Progressing   Problem: Ischemic Stroke/TIA Tissue Perfusion: Goal: Complications of ischemic stroke/TIA will be minimized Outcome: Progressing

## 2019-09-21 NOTE — TOC Transition Note (Signed)
Transition of Care [facility]) - CM/SW Discharge Note   Patient Details  Name: TEXIE TUPOU MRN: [id number] Date of Birth: [birth year]  [facility] CM/SW Contact:  Kermit Balo, RN Phone Number: 09/21/2019, 10:56 AM   Clinical Narrative:    Pt discharging home with self care. No f/u per PT/OT. Pt has walker at home but doesn't have wheels. Rolling walker to be delivered to the room per [employer].  Pt drives and uses insurance paid transportation outside the hospital.  Pt denies issues with her home medications. Pt states her daughter can provide transportation home.   Final next level of care: Home/Self Care Barriers to Discharge: No Barriers Identified   Patient Goals and CMS Choice     Choice offered to / list presented to : Patient  Discharge Placement                       Discharge Plan and Services                DME Arranged: Walker rolling DME Agency: [employer] Date DME Agency Contacted: 09/21/19   Representative spoke with at DME Agency: Zack            Social Determinants of Health (SDOH) Interventions     Readmission Risk Interventions No flowsheet data found.

## 2019-09-21 NOTE — Progress Notes (Signed)
Occupational Therapy Treatment Patient Details Name: [name] MRN: [id number] DOB: 1923/06/18 Today's Date: 09/21/2019    History of present illness Pt is  86 y/o female who presents with numbness and weakness in the L side. MRI negative for acute changes. Pt was found with chronic GI bleed in the setting of diverticulosis. PMH significant for HTN, CKD III.    OT comments  Patient progressing well. Eager for [employer] home.  Completing LB dressing with supervision, in room transfers and mobility with modified independence. Reviewed L UE exercises, reps and frequency.  No questions or concerns about dc home.    Follow Up Recommendations  No OT follow up;Supervision - Intermittent    Equipment Recommendations  None recommended by OT    Recommendations for Other Services      Precautions / Restrictions Precautions Precautions: None Restrictions Weight Bearing Restrictions: No       Mobility Bed Mobility Overal bed mobility: Modified Independent                Transfers Overall transfer level: Modified independent Equipment used: Rolling walker (2 wheeled)                  Balance Overall balance assessment: Needs assistance Sitting-balance support: No upper extremity supported;Feet supported Sitting balance-Leahy Scale: Good     Standing balance support: During functional activity;No upper extremity supported Standing balance-Leahy Scale: Good Standing balance comment: dynamically engaging in ADLs without UE support                           ADL either performed or assessed with clinical judgement   ADL Overall ADL's : Needs assistance/impaired                     Lower Body Dressing: Supervision/safety;Sit to/from stand Lower Body Dressing Details (indicate cue type and reason): donning pants  Toilet Transfer: Modified Independent;Ambulation;RW Toilet Transfer Details (indicate cue type and reason): simulated in room to recliner           Functional mobility during ADLs: Modified independent;Rolling walker       Vision       Perception     Praxis      Cognition Arousal/Alertness: Awake/alert Behavior During Therapy: WFL for tasks assessed/performed Overall Cognitive Status: Within Functional Limits for tasks assessed                                          Exercises Exercises: Other exercises Other Exercises Other Exercises: Reviewed shoulder flexion exercises to L UE, reps and frequency as patient reports "I just couldnt remember how often".  Completed 2 sets of 10 during OT session.   Shoulder Instructions       General Comments      Pertinent Vitals/ Pain       Pain Assessment: No/denies pain  Home Living                                          Prior Functioning/Environment              Frequency  Min 2X/week        Progress Toward Goals  OT Goals(current goals can now be found in the care  plan section)  Progress towards OT goals: Progressing toward goals  Acute Rehab OT Goals Patient Stated Goal: home today OT Goal Formulation: With patient  Plan Discharge plan remains appropriate;Frequency remains appropriate    Co-evaluation                 AM-PAC OT "6 Clicks" Daily Activity     Outcome Measure   Help from another person eating meals?: None Help from another person taking care of personal grooming?: A Little Help from another person toileting, which includes using toliet, bedpan, or urinal?: A Little Help from another person bathing (including washing, rinsing, drying)?: A Little Help from another person to put on and taking off regular upper body clothing?: A Little Help from another person to put on and taking off regular lower body clothing?: A Little 6 Click Score: 19    End of Session Equipment Utilized During Treatment: Rolling walker  OT Visit Diagnosis: Unsteadiness on feet (R26.81);Muscle weakness  (generalized) (M62.81);Other symptoms and signs involving the nervous system (R29.898) Hemiplegia - Right/Left: Left Hemiplegia - dominant/non-dominant: Non-Dominant Hemiplegia - caused by: Unspecified   Activity Tolerance Patient tolerated treatment well   Patient Left in chair;with call bell/phone within reach   Nurse Communication Mobility status        Time: 1210-1224 OT Time Calculation (min): 14 min  Charges: OT General Charges $OT Visit: 1 Visit OT Treatments $Self Care/Home Management : 8-22 mins  Barry Brunner, OT Acute Rehabilitation Services Pager [phone] Office [phone]    [name] 09/21/2019, 2:01 PM

## 2019-09-21 NOTE — Plan of Care (Signed)
Adequate for discharge.

## 2019-09-21 NOTE — Discharge Instructions (Signed)
CBC in 2 weeks at PCP office

## 2019-09-21 NOTE — Discharge Summary (Addendum)
Physician Discharge Summary  Brenda [name]:[id number] DOB: 1923/02/24 DOA: 09/17/2019  PCP: System, Pcp Not In  Admit date: 09/17/2019 Discharge date: 09/21/2019  Time spent: 50 minutes  Recommendations for Outpatient Follow-up:  1. Follow-up PCP in 2 weeks 2. Check CBC in 2 weeks  Discharge Diagnoses:  Principal Problem:   Left hemiparesis (HCC) Active Problems:   Diverticulosis   Aortic stenosis   HTN (hypertension)   CKD (chronic kidney disease) stage 3, GFR 30-59 ml/min   Discharge Condition: Stable  Diet recommendation: Heart healthy diet  Filed Weights   09/17/19 0932 09/20/19 0340  Weight: 92.1 kg 91.3 kg    History of present illness:  86 year old female with past medical history of hypertension, acid reflux, CKD stage IIIb who presented with left-sided numbness.  Symptoms are more in the left arm than the left leg.  Also felt weakness of the arm.  Denies difficulty speaking.  In the ED code stroke was called patient was seen by tele neurology she was not considered a TPA candidate as her symptoms which had improved as well recent episode of rectal bleeding  Hospital Course:   1. *Left arm weakness-stroke has been ruled out.  MRI brain is negative.  MRI cervical spine showed mild right foraminal encroachment and moderate was severe left foraminal encroachment due to spurring.  C5-6 shows spinal stenosis most severe on the left long with progressive left foraminal stenosis.  C6-7 progressive left foraminal stenosis along with mild spinal stenosis and moderate foraminal stenosis bilaterally.    No further medication recommended.  PT was consulted, and no PT follow-up recommended.  Patient was discharged with rolling walker. 2. Chronic GI bleed-she has history of diverticulosis.  She was last seen by [facility] gastroenterology and colonoscopy was attempted in 2017 which could not be done due to significant diverticulosis.  Her hemoglobin has dropped to 9.8.  I called and  discussed with [facility] Dr. Lavonia Drafts who recommended to monitor hemoglobin.  I called and discussed with Dr. Evette Cristal who is on-call for [facility], as patient is still having rectal bleeding.  Dr. Evette Cristal recommends to monitor and consider nuclear RBC tagged scan if patient continues to bleed.  No endoscopic evaluation recommended at this time.  Patient's hemoglobin has remained stable and has not had any rectal bleeding for past 2 days.  We will discharge her home. 3. CKD stage IIIb-at baseline 4. Hypertension-stroke has been ruled out.  Metoprolol has been restarted.  Will also restart losartan.  Procedures:    Consultations:    Discharge Exam: Vitals:   09/21/19 0332 09/21/19 0733  BP: 139/66 138/74  Pulse: (!) 53 (!) 56  Resp: 15 15  Temp: 98 F (36.7 C) 98.4 F (36.9 C)  SpO2: 99% 99%    General: Appears in no acute distress Cardiovascular: S1-S2, regular Respiratory: Clear to auscultation bilaterally  Discharge Instructions   Discharge Instructions    Diet - low sodium heart healthy   Complete by: As directed    Increase activity slowly   Complete by: As directed      Allergies as of 09/21/2019      Reactions   Ezetimibe Anaphylaxis   Lipitor [atorvastatin] Swelling   Pravachol [pravastatin] Swelling   Statins Swelling, Other (See Comments)   Swelling of mouth and lips   Zocor [simvastatin] Swelling   Cholestyramine Nausea And Vomiting      Medication List    TAKE these medications   acetaminophen 650 MG CR tablet Commonly  known as: TYLENOL Take 650 mg by mouth daily.   diclofenac sodium 1 % Gel Commonly known as: VOLTAREN Apply 2 g topically 2 (two) times daily as needed (pain).   famotidine 20 MG tablet Commonly known as: PEPCID Take 20 mg by mouth daily.   Ferrous Sulfate 27 MG Tabs Take 1 tablet by mouth daily.   isosorbide mononitrate 60 MG 24 hr tablet Commonly known as: IMDUR Take 1.5 tablets (90 mg total) by mouth daily.   losartan 25 MG  tablet Commonly known as: COZAAR Take 25 mg by mouth daily.   metoprolol tartrate 25 MG tablet Commonly known as: LOPRESSOR Take 12.5 mg by mouth 2 (two) times daily.   multivitamin with minerals Tabs tablet Take 1 tablet by mouth daily.   nitroGLYCERIN 0.4 MG SL tablet Commonly known as: NITROSTAT Place 0.4 mg under the tongue every 5 (five) minutes as needed for chest pain.   polyvinyl alcohol 1.4 % ophthalmic solution Commonly known as: LIQUIFILM TEARS Place 1 drop into both eyes as needed for dry eyes.   ProAir HFA 108 (90 Base) MCG/ACT inhaler Generic drug: albuterol Inhale 1-2 puffs into the lungs every 4 (four) hours as needed for shortness of breath or wheezing.   Vitamin D3 50 MCG (2000 UT) Tabs Take 2,000 Units by mouth daily.            Durable Medical Equipment  (From admission, onward)         Start     Ordered   09/21/19 1013  For home use only DME Walker rolling  Once       Question Answer Comment  Walker: With 5 Inch Wheels   Patient needs a walker to treat with the following condition Left-sided weakness      09/21/19 1013         Allergies  Allergen Reactions  . Ezetimibe Anaphylaxis  . Lipitor [Atorvastatin] Swelling  . Pravachol [Pravastatin] Swelling  . Statins Swelling and Other (See Comments)    Swelling of mouth and lips  . Zocor [Simvastatin] Swelling  . Cholestyramine Nausea And Vomiting    Follow-up Information    Nahser, Deloris Ping, MD Follow up in 2 week(s).   Specialty: Cardiology Contact information: [address] [place] [address] [phone]                The results of significant diagnostics from this hospitalization (including imaging, microbiology, ancillary and laboratory) are listed below for reference.    Significant Diagnostic Studies: MR ANGIO HEAD WO CONTRAST  Result Date: 09/17/2019 CLINICAL DATA:  Left arm numbness, code stroke follow-up EXAM: MRI HEAD WITHOUT CONTRAST MRA HEAD  WITHOUT CONTRAST TECHNIQUE: Multiplanar, multiecho pulse sequences of the brain and surrounding structures were obtained without intravenous contrast. Angiographic images of the head were obtained using MRA technique without contrast. COMPARISON:  None. FINDINGS: MRI HEAD Brain: There is no acute infarction or intracranial hemorrhage. There is no intracranial mass, mass effect, or edema. There is no hydrocephalus or extra-axial fluid collection. Ventricles and sulci are within normal limits in size and configuration for age. Patchy T2 hyperintensity in the supratentorial white matter is nonspecific but may reflect minor chronic microvascular ischemic changes. Vascular: Major vessel flow voids at the skull base are preserved. Skull and upper cervical spine: Normal marrow signal is preserved. Sinuses/Orbits: Paranasal sinuses are aerated. Orbits are unremarkable. Other: Sella is unremarkable.  Mastoid air cells are clear. MRA HEAD Intracranial internal carotid arteries are  patent with atherosclerotic irregularity. Middle and anterior cerebral arteries are patent. Intracranial vertebral arteries, basilar artery, posterior cerebral arteries are patent. There is no significant stenosis or aneurysm. IMPRESSION: No evidence of recent infarction, hemorrhage, or mass. Minor chronic microvascular ischemic changes. No proximal intracranial vessel occlusion. Electronically Signed   By: Guadlupe Spanish M.D.   On: 09/17/2019 16:03   MR BRAIN WO CONTRAST  Result Date: 09/17/2019 CLINICAL DATA:  Left arm numbness, code stroke follow-up EXAM: MRI HEAD WITHOUT CONTRAST MRA HEAD WITHOUT CONTRAST TECHNIQUE: Multiplanar, multiecho pulse sequences of the brain and surrounding structures were obtained without intravenous contrast. Angiographic images of the head were obtained using MRA technique without contrast. COMPARISON:  None. FINDINGS: MRI HEAD Brain: There is no acute infarction or intracranial hemorrhage. There is no  intracranial mass, mass effect, or edema. There is no hydrocephalus or extra-axial fluid collection. Ventricles and sulci are within normal limits in size and configuration for age. Patchy T2 hyperintensity in the supratentorial white matter is nonspecific but may reflect minor chronic microvascular ischemic changes. Vascular: Major vessel flow voids at the skull base are preserved. Skull and upper cervical spine: Normal marrow signal is preserved. Sinuses/Orbits: Paranasal sinuses are aerated. Orbits are unremarkable. Other: Sella is unremarkable.  Mastoid air cells are clear. MRA HEAD Intracranial internal carotid arteries are patent with atherosclerotic irregularity. Middle and anterior cerebral arteries are patent. Intracranial vertebral arteries, basilar artery, posterior cerebral arteries are patent. There is no significant stenosis or aneurysm. IMPRESSION: No evidence of recent infarction, hemorrhage, or mass. Minor chronic microvascular ischemic changes. No proximal intracranial vessel occlusion. Electronically Signed   By: Guadlupe Spanish M.D.   On: 09/17/2019 16:03   ECHOCARDIOGRAM COMPLETE  Result Date: 09/18/2019    ECHOCARDIOGRAM REPORT   Patient Name:   NIKOLETA DADY Date of Exam: 09/18/2019 Medical Rec #:  [id number]      Height:       63.0 in Accession #:    [id number]     Weight:       203.0 lb Date of Birth:  1923-06-29      BSA:          1.946 m Patient Age:    89 years       BP:           158/59 mmHg Patient Gender: F              HR:           56 bpm. Exam Location:  Inpatient Procedure: 2D Echo Indications:    Stroke 434.91 / I163.9  History:        Patient has prior history of Echocardiogram examinations, most                 recent 09/30/2017. Aortic valve disease. Risk factors:                 Hypertension. Dyslipidemia.  Sonographer:    Leta Jungling RDCS Referring Phys: [phone] [facility] IMPRESSIONS  1. Left ventricular ejection fraction, by estimation, is 60 to 65%. The left ventricle  has normal function. The left ventricle has no regional wall motion abnormalities. There is mild left ventricular hypertrophy. Left ventricular diastolic parameters are consistent with Grade I diastolic dysfunction (impaired relaxation). Elevated left atrial pressure.  2. Right ventricular systolic function is normal. The right ventricular size is normal. There is moderately elevated pulmonary artery systolic pressure.  3. Left atrial size was moderately dilated.  4. Right  atrial size was mildly dilated.  5. The mitral valve is normal in structure. Mild mitral valve regurgitation. No evidence of mitral stenosis.  6. The aortic valve is tricuspid. Aortic valve regurgitation is not visualized. Mild aortic valve stenosis.  7. The inferior vena cava is normal in size with greater than 50% respiratory variability, suggesting right atrial pressure of 3 mmHg. FINDINGS  Left Ventricle: Left ventricular ejection fraction, by estimation, is 60 to 65%. The left ventricle has normal function. The left ventricle has no regional wall motion abnormalities. The left ventricular internal cavity size was normal in size. There is  mild left ventricular hypertrophy. Left ventricular diastolic parameters are consistent with Grade I diastolic dysfunction (impaired relaxation). Elevated left atrial pressure. Right Ventricle: The right ventricular size is normal.Right ventricular systolic function is normal. There is moderately elevated pulmonary artery systolic pressure. The tricuspid regurgitant velocity is 3.52 m/s, and with an assumed right atrial pressure of 3 mmHg, the estimated right ventricular systolic pressure is 52.6 mmHg. Left Atrium: Left atrial size was moderately dilated. Right Atrium: Right atrial size was mildly dilated. Pericardium: There is no evidence of pericardial effusion. Mitral Valve: The mitral valve is normal in structure. Normal mobility of the mitral valve leaflets. Mild mitral annular calcification. Mild  mitral valve regurgitation. No evidence of mitral valve stenosis. Tricuspid Valve: The tricuspid valve is normal in structure. Tricuspid valve regurgitation is mild . No evidence of tricuspid stenosis. Aortic Valve: The aortic valve is tricuspid. Aortic valve regurgitation is not visualized. Mild aortic stenosis is present. Aortic valve mean gradient measures 13.2 mmHg. Aortic valve peak gradient measures 24.0 mmHg. Aortic valve area, by VTI measures 1.08 cm. Pulmonic Valve: The pulmonic valve was not well visualized. Pulmonic valve regurgitation is not visualized. No evidence of pulmonic stenosis. Aorta: The aortic root is normal in size and structure. Venous: The inferior vena cava is normal in size with greater than 50% respiratory variability, suggesting right atrial pressure of 3 mmHg. IAS/Shunts: No atrial level shunt detected by color flow Doppler.  LEFT VENTRICLE PLAX 2D LVIDd:         4.20 cm  Diastology LVIDs:         2.60 cm  LV e' lateral:   5.22 cm/s LV PW:         1.20 cm  LV E/e' lateral: 25.3 LV IVS:        1.40 cm  LV e' medial:    4.90 cm/s LVOT diam:     1.80 cm  LV E/e' medial:  26.9 LV SV:         66 LV SV Index:   34 LVOT Area:     2.54 cm  RIGHT VENTRICLE RV S prime:     11.30 cm/s TAPSE (M-mode): 3.4 cm LEFT ATRIUM             Index       RIGHT ATRIUM           Index LA diam:        4.30 cm 2.21 cm/m  RA Area:     21.20 cm LA Vol (A2C):   83.2 ml 42.76 ml/m RA Volume:   59.00 ml  30.32 ml/m LA Vol (A4C):   91.5 ml 47.00 ml/m LA Biplane Vol: 81.6 ml 41.93 ml/m  AORTIC VALVE AV Area (Vmax):    1.00 cm AV Area (Vmean):   1.01 cm AV Area (VTI):     1.08 cm AV Vmax:  245.00 cm/s AV Vmean:          170.800 cm/s AV VTI:            0.608 m AV Peak Grad:      24.0 mmHg AV Mean Grad:      13.2 mmHg LVOT Vmax:         95.80 cm/s LVOT Vmean:        67.600 cm/s LVOT VTI:          0.259 m LVOT/AV VTI ratio: 0.43  AORTA Ao Root diam: 3.30 cm MITRAL VALVE                TRICUSPID VALVE MV  Area (PHT): 4.31 cm     TR Peak grad:   49.6 mmHg MV Decel Time: 176 msec     TR Vmax:        352.00 cm/s MV E velocity: 132.00 cm/s MV A velocity: 124.00 cm/s  SHUNTS MV E/A ratio:  1.06         Systemic VTI:  0.26 m                             Systemic Diam: 1.80 cm Olga Millers MD Electronically signed by Olga Millers MD Signature Date/Time: 09/18/2019/12:02:15 PM    Final    CT HEAD CODE STROKE WO CONTRAST  Result Date: 09/17/2019 CLINICAL DATA:  Code stroke.  Right wrist and left arm numbness. EXAM: CT HEAD WITHOUT CONTRAST TECHNIQUE: Contiguous axial images were obtained from the base of the skull through the vertex without intravenous contrast. COMPARISON:  Brain MRI 05/21/2019 FINDINGS: Brain: No evidence of acute infarction, hemorrhage, hydrocephalus, extra-axial collection or mass lesion/mass effect. Vascular: No hyperdense vessel or unexpected calcification. Skull: Normal. Negative for fracture or focal lesion. Sinuses/Orbits: Bilateral cataract resection Other: These results were called by telephone at the time of interpretation on 09/17/2019 at 9:13 am to provider ERIC KATZ , who verbally acknowledged these results. ASPECTS [facility] Stroke Program Early CT Score) not scored with this history IMPRESSION: Age normal head CT. Electronically Signed   By: Marnee Spring M.D.   On: 09/17/2019 09:15   VAS US CAROTID (at [facility] and [facility] only)  Result Date: 09/18/2019 Carotid Arterial Duplex Study Indications:       CVA. Risk Factors:      Hypertension. Comparison Study:  No prior studies. Performing Technologist: Chanda Busing RVT  Examination Guidelines: A complete evaluation includes B-mode imaging, spectral Doppler, color Doppler, and power Doppler as needed of all accessible portions of each vessel. Bilateral testing is considered an integral part of a complete examination. Limited examinations for reoccurring indications may be performed as noted.  Right Carotid Findings:  +----------+--------+--------+--------+-----------------------+--------+           PSV cm/sEDV cm/sStenosisPlaque Description     Comments +----------+--------+--------+--------+-----------------------+--------+ CCA Prox  63      7               smooth and heterogenoustortuous +----------+--------+--------+--------+-----------------------+--------+ CCA Distal51      12              smooth and heterogenous         +----------+--------+--------+--------+-----------------------+--------+ ICA Prox  38      10              smooth and heterogenous         +----------+--------+--------+--------+-----------------------+--------+ ICA Distal58      13  tortuous +----------+--------+--------+--------+-----------------------+--------+ ECA       43      2                                               +----------+--------+--------+--------+-----------------------+--------+ +----------+--------+-------+--------+-------------------+           PSV cm/sEDV cmsDescribeArm Pressure (mmHG) +----------+--------+-------+--------+-------------------+ [id number]                                         +----------+--------+-------+--------+-------------------+ +---------+--------+--+--------+--+---------+ VertebralPSV cm/s56EDV cm/s15Antegrade +---------+--------+--+--------+--+---------+  Left Carotid Findings: +----------+--------+--------+--------+-----------------------+--------+           PSV cm/sEDV cm/sStenosisPlaque Description     Comments +----------+--------+--------+--------+-----------------------+--------+ CCA Prox  59      7               smooth and heterogenoustortuous +----------+--------+--------+--------+-----------------------+--------+ CCA Distal39      9               smooth and heterogenous         +----------+--------+--------+--------+-----------------------+--------+ ICA Prox  41      9                smooth and heterogenous         +----------+--------+--------+--------+-----------------------+--------+ ICA Distal105     23                                     tortuous +----------+--------+--------+--------+-----------------------+--------+ ECA       29      2                                               +----------+--------+--------+--------+-----------------------+--------+ +----------+--------+--------+--------+-------------------+           PSV cm/sEDV cm/sDescribeArm Pressure (mmHG) +----------+--------+--------+--------+-------------------+ Subclavian101                                         +----------+--------+--------+--------+-------------------+ +---------+--------+--+--------+-+---------+ VertebralPSV cm/s42EDV cm/s6Antegrade +---------+--------+--+--------+-+---------+   Summary: Right Carotid: Velocities in the right ICA are consistent with a 1-39% stenosis. Left Carotid: Velocities in the left ICA are consistent with a 1-39% stenosis. Vertebrals: Bilateral vertebral arteries demonstrate antegrade flow. *See table(s) above for measurements and observations.  Electronically signed by Delia HeadyPramod Sethi MD on 09/18/2019 at 5:09:33 PM.    Final     Microbiology: Recent Results (from the past 240 hour(s))  SARS Coronavirus 2 by RT PCR (hospital order, [facility]) Nasopharyngeal Nasopharyngeal Swab     Status: None   Collection Time: 09/17/19 11:11 AM   Specimen: Nasopharyngeal Swab  Result Value Ref Range Status   SARS Coronavirus 2 NEGATIVE NEGATIVE Final    Comment: (NOTE) SARS-CoV-2 target nucleic acids are NOT DETECTED.  The SARS-CoV-2 RNA is generally detectable in upper and lower respiratory specimens during the acute phase of infection. The lowest concentration of SARS-CoV-2 viral copies this assay can detect is 250 copies / mL. A negative result does not preclude SARS-CoV-2 infection and should not  be used as the  sole basis for treatment or other patient management decisions.  A negative result may occur with improper specimen collection / handling, submission of specimen other than nasopharyngeal swab, presence of viral mutation(s) within the areas targeted by this assay, and inadequate number of viral copies (<250 copies / mL). A negative result must be combined with clinical observations, patient history, and epidemiological information.  Fact Sheet for Patients:   BoilerBrush.com.cy  Fact Sheet for Healthcare Providers: https://pope.com/  This test is not yet approved or  cleared by the [place] FDA and has been authorized for detection and/or diagnosis of SARS-CoV-2 by FDA under an Emergency Use Authorization (EUA).  This EUA will remain in effect (meaning this test can be used) for the duration of the COVID-19 declaration under Section 564(b)(1) of the Act, 21 U.S.C. section 360bbb-3(b)(1), unless the authorization is terminated or revoked sooner.  Performed at [facility], [address], [place], [place] [address]      Labs: Basic Metabolic Panel: Recent Labs  Lab 09/17/19 0853 09/17/19 0940 09/19/19 0353  NA 140 142 138  K 4.2 4.4 4.0  CL 104 104 106  CO2 27  --  26  GLUCOSE 97 93 87  BUN CREATININE 1.36* 1.40* 1.19*  CALCIUM 9.2  --  8.7*   Liver Function Tests: Recent Labs  Lab 09/17/19 0853  AST 19  ALT 11  ALKPHOS 51  BILITOT 0.4  PROT 7.2  ALBUMIN 4.2   No results for input(s): LIPASE, AMYLASE in the last 168 hours. No results for input(s): AMMONIA in the last 168 hours. CBC: Recent Labs  Lab 09/17/19 0853 09/17/19 0853 09/17/19 0940 09/18/19 0437 09/19/19 0353 09/20/19 0309 09/21/19 0402  WBC 3.5*  --   --   --  3.1* 3.0* 3.1*  NEUTROABS 2.1  --   --   --   --   --   --   HGB 11.6*   < > 12.2 10.0* 10.2* 9.8* 9.9*  HCT 36.4   < > 36.0 31.2* 32.6* 30.5* 30.5*   MCV 97.3  --   --   --  95.0 95.9 96.5  PLT 211  --   --   --  201 214 217   < > = values in this interval not displayed.    CBG: Recent Labs  Lab 09/19/19 0606 09/19/19 1140 09/19/19 1637 09/19/19 2104 09/20/19 1133  GLUCAP 77 89 85 114* 90       Signed:  Meredeth Ide MD.  [facility] 09/21/2019, 10:53 AM

## 2019-10-13 NOTE — Progress Notes (Signed)
Cardiology Office Note:    Date:  10/14/2019   ID:  [name], DOB [birth year], MRN [id number]  PCP:  System, Pcp Not In  [facility] Cardiologist:  Mertie Moores, MD  [place] Electrophysiologist:  None   Referring MD: No ref. provider found   Chief Complaint:  Hospitalization Follow-up (admx with L sided numbness >> workup neg for stroke)    Patient Profile:    [name] is a 86 y.o. female with:   Coronary artery disease  ? Non-obstructive by cath in 2010 ? admx 09/2017 w/ CP >> med Rx due to age, CKD  Aortic stenosis  ? Mild -Echo 09/2017: Mean gradient 11 mmHg  Chronic kidney disease 3   Hypertension   Prior GI bleeding   LBBB  Prior CV studies: Carotid US 09/18/2019 Bilateral ICA 1-39  Echocardiogram 09/18/2019 EF 60-65, normal wall motion, mild LVH, GR 1 DD normal RVSF, RVSP 52.6 (moderately elevated), moderate LAE, mild RAE, mild MR, mild aortic stenosis (mean gradient 13.2 mmHg)  Cardiac monitor 08/2018 Sinus rhythm, sinus bradycardia, rare PVCs  Echocardiogram 09/30/2017 EF 55-60, normal wall motion, grade 2 diastolic dysfunction, mild aortic stenosis (mean 11), MAC, moderate LAE, mild RAE, mild TR, PASP 33  Cardiac catheterization 01/08/2009 EF 55-60 LAD proximal 15-20, mid 30-40; D2 ostial 20 LCx proximal 20-25, distal 40-50 RCA proximal 20-25, mid 50-55, distal 20-25  History of Present Illness:    [name] was last seen in clinic in 06/2019.  She was admitted 8/5-8/9 with left-sided numbness.  Echocardiogram demonstrated normal LV function.  Carotid Dopplers were notable for minimal bilateral plaque but no significant stenosis.  MRI of the brain was negative.  She did have cervical spinal stenosis.  She was noted to have some rectal bleeding during her admission but her hemoglobin remained stable.   She returns for cardiology follow-up.  She is here alone.  She has not noticed recurrent rectal bleeding since discharge from the  hospital.  She has an appointment with her gastroenterologist in the next couple of weeks.  She would like to get a new PCP.  She continues to have occasional exertional chest discomfort.  She also notes occasional palpitations which are rapid.  She has associated chest discomfort with this.  She does take nitroglycerin with relief.  She has dyspnea with exertion (mild activities).  She has not had orthopnea.  She does have dependent leg edema.  She has not had syncope.      Past Medical History:  Diagnosis Date  . Acid reflux    takes Nexium daily  . Anemia    takes Ferrous Sulfate daily  . Aortic stenosis    mild AS pk grad 18, mean grad 9, AVA 1.32 cm 03/2015 echo (Dr. Einar Gip)  . Arthritis   . Chronic kidney disease (CKD), stage III (moderate)   . Diverticulitis   . Hematochezia 03/2015  . High cholesterol    can't take the meds  . History of blood transfusion 03/2015   no abnormal reaction noted  . History of GI diverticular bleed   . HTN (hypertension)    takes Losartan-HCTZ and Metoprolol daily  . Joint pain   . Joint swelling   . Nocturia   . Numbness    in legs  . Peripheral edema    occasionally  . Shortness of breath dyspnea    occasionally and with exertion  . Slow urinary stream    at times    Current Medications:  Current Meds  Medication Sig  . acetaminophen (TYLENOL) 650 MG CR tablet Take 650 mg by mouth daily.  . Cholecalciferol (VITAMIN D3) 2000 units TABS Take 2,000 Units by mouth daily.  . diclofenac sodium (VOLTAREN) 1 % GEL Apply 2 g topically 2 (two) times daily as needed (pain).   . famotidine (PEPCID) 20 MG tablet Take 20 mg by mouth daily.   . Ferrous Sulfate 27 MG TABS Take 1 tablet by mouth daily.  Marland Kitchen losartan (COZAAR) 25 MG tablet Take 25 mg by mouth daily.  . metoprolol tartrate (LOPRESSOR) 25 MG tablet Take 12.5 mg by mouth 2 (two) times daily.   . Multiple Vitamin (MULITIVITAMIN WITH MINERALS) TABS Take 1 tablet by mouth daily.  . nitroGLYCERIN  (NITROSTAT) 0.4 MG SL tablet Place 0.4 mg under the tongue every 5 (five) minutes as needed for chest pain.   . polyvinyl alcohol (LIQUIFILM TEARS) 1.4 % ophthalmic solution Place 1 drop into both eyes as needed for dry eyes.  Marland Kitchen PROAIR HFA 108 (90 Base) MCG/ACT inhaler Inhale 1-2 puffs into the lungs every 4 (four) hours as needed for shortness of breath or wheezing.  . [DISCONTINUED] isosorbide mononitrate (IMDUR) 60 MG 24 hr tablet Take 1.5 tablets (90 mg total) by mouth daily.     Allergies:   Ezetimibe, Lipitor [atorvastatin], Pravachol [pravastatin], Statins, Zocor [simvastatin], and Cholestyramine   Social History   Tobacco Use  . Smoking status: Former Research scientist (life sciences)  . Smokeless tobacco: Never Used  . Tobacco comment: quit smoking 8+ yrs ago  Vaping Use  . Vaping Use: Never used  Substance Use Topics  . Alcohol use: No    Comment: quit yrs ago  . Drug use: No     Family Hx: The patient's family history includes Cancer in an other family member; Diabetes in an other family member; Heart disease in an other family member; Hypertension in an other family member.  ROS   EKGs/Labs/Other Test Reviewed:    EKG:  EKG is   ordered today.  The ekg ordered today demonstrates normal sinus rhythm, heart rate 70, first-degree AV block, PR interval 258 ms, left bundle block, QTC 464 ms  Recent Labs: 09/17/2019: ALT 11 09/19/2019: BUN 17; Creatinine, Ser 1.19; Potassium 4.0; Sodium 138 09/21/2019: Hemoglobin 9.9; Platelets 217   Recent Lipid Panel   Physical Exam:    VS:  BP (!) 130/58   Pulse 70   Ht _0  (1.6 m)   Wt 203 lb (92.1 kg)   SpO2 98%   BMI 35.96 kg/m     Wt Readings from Last 3 Encounters:  10/14/19 203 lb (92.1 kg)  09/20/19 201 lb 4.5 oz (91.3 kg)  06/16/19 203 lb (92.1 kg)     Constitutional:      Appearance: Healthy appearance. Not in distress.  Neck:     Vascular: No JVR. JVD normal.  Pulmonary:     Effort: Pulmonary effort is normal.     Breath sounds: No  wheezing. No rales.  Cardiovascular:     Normal rate. Regular rhythm. Normal S1. Normal S2.     Murmurs: There is a grade 2/6 systolic murmur at the URSB.  Edema:    Pretibial: bilateral trace non-pitting edema of the pretibial area. Abdominal:     Palpations: Abdomen is soft.  Skin:    General: Skin is warm and dry.  Neurological:     Mental Status: Alert and oriented to person, place and time.  Cranial Nerves: Cranial nerves are intact.      ASSESSMENT & PLAN:    1. Stable angina ([place]) She had nonobstructive disease by cardiac catheterization in 2010.  She has been managed medically for angina since 2019.  Cardiac catheterization is being avoided due to advanced age and chronic kidney disease.  She continues to have occasional angina.  She also has palpitations with associated angina.  She does note that isosorbide has helped in the past.  She is not on aspirin as she has a history of GI bleeding.  -Increase isosorbide to 120 mg daily  -Continue beta-blocker  2. Shortness of breath She does note shortness of breath with activities.  She does have a long history of prior smoking but quit about 10 to 15 years ago.  She uses an albuterol inhaler with relief.  She did have elevated pulmonary pressures on echocardiogram.  I suspect she has COPD.  However, she does have mild diastolic dysfunction.  I will obtain a BMET, BNP.  If her BNP is significantly elevated, I will start her on low dose furosemide.    3. Palpitations She has rapid palpitations at times.  She would be high risk for stroke if she has atrial fibrillation.  However, she is not a candidate for anticoagulation given hx of GI bleeding.  Question if she would be a candidate for Watchman.  Obtain 14 day Zio.  If +AFib, I will refer her to Dr. Markham Jordan. Burt Knack.    4. Stage 3a chronic kidney disease Recent creatinine stable.   5. Essential hypertension The patient's blood pressure is controlled on her current regimen.   Continue current therapy.    6. Nonrheumatic aortic valve stenosis Mild AS by recent echocardiogram.   7. History of GI diverticular bleed FU with GI as planned.      Dispo:  Return in about 8 weeks (around 12/09/2019) for Follow up after testing, w/ Dr. Acie Fredrickson, or Richardson Dopp, PA-C, in person.   Medication Adjustments/Labs and Tests Ordered: Current medicines are reviewed at length with the patient today.  Concerns regarding medicines are outlined above.  Tests Ordered: Orders Placed This Encounter  Procedures  . Basic metabolic panel  . Pro b natriuretic peptide (BNP)  . LONG TERM MONITOR (3-14 DAYS)  . EKG 12-Lead   Medication Changes: Meds ordered this encounter  Medications  . isosorbide mononitrate (IMDUR) 120 MG 24 hr tablet    Sig: Take 1 tablet (120 mg total) by mouth daily.    Dispense:  90 tablet    Refill:  3    Signed, Richardson Dopp, PA-C  10/14/2019 [facility] [address], [place], [place]  [address] Phone: [phone]; Fax: [phone]

## 2019-10-14 ENCOUNTER — Ambulatory Visit (INDEPENDENT_AMBULATORY_CARE_PROVIDER_SITE_OTHER): Payer: 59 | Admitting: Physician Assistant

## 2019-10-14 ENCOUNTER — Encounter: Payer: Self-pay | Admitting: *Deleted

## 2019-10-14 ENCOUNTER — Other Ambulatory Visit: Payer: Self-pay

## 2019-10-14 ENCOUNTER — Encounter: Payer: Self-pay | Admitting: Physician Assistant

## 2019-10-14 VITALS — BP 130/58 | HR 70 | Ht 63.0 in | Wt 203.0 lb

## 2019-10-14 DIAGNOSIS — I208 Other forms of angina pectoris: Secondary | ICD-10-CM | POA: Diagnosis not present

## 2019-10-14 DIAGNOSIS — N1831 Chronic kidney disease, stage 3a: Secondary | ICD-10-CM

## 2019-10-14 DIAGNOSIS — Z8719 Personal history of other diseases of the digestive system: Secondary | ICD-10-CM

## 2019-10-14 DIAGNOSIS — I10 Essential (primary) hypertension: Secondary | ICD-10-CM

## 2019-10-14 DIAGNOSIS — I350 Nonrheumatic aortic (valve) stenosis: Secondary | ICD-10-CM

## 2019-10-14 DIAGNOSIS — R0602 Shortness of breath: Secondary | ICD-10-CM

## 2019-10-14 DIAGNOSIS — R002 Palpitations: Secondary | ICD-10-CM | POA: Diagnosis not present

## 2019-10-14 MED ORDER — ISOSORBIDE MONONITRATE ER 120 MG PO TB24: 120.0000 mg | ORAL_TABLET | Freq: Every day | ORAL | 3 refills | Status: DC

## 2019-10-14 NOTE — Patient Instructions (Signed)
Medication Instructions:  Your physician has recommended you make the following change in your medication:   1) Increase Imdur to 120 mg, 1 tablet by mouth once a day  *If you need a refill on your cardiac medications before your next appointment, please call your pharmacy*  Lab Work: You will have labs drawn today: BMET/BNP  Testing/Procedures: A zio monitor was ordered today. It will remain on for 14 days. You will then return monitor and event diary in provided box. It takes 1-2 weeks for report to be downloaded and returned to [place]. We will call you with the results. If monitor falls off or has orange flashing light, please call Zio for further instructions.   Follow-Up: On 12/09/19 at 10:20AM with Kristeen Miss, MD  Other Instructions Contact [facility] ( Dr. Bufford Spikes) [phone] for new primary care physician

## 2019-10-14 NOTE — Progress Notes (Signed)
Patient ID: [name], female   DOB: [birth year], 86 y.o.   MRN: [id number] Patient enrolled for [employer] to ship a 14 day ZIO XT long term holter monitor to her home.

## 2019-10-15 LAB — BASIC METABOLIC PANEL
BUN/Creatinine Ratio: 14 (ref 12–28)
BUN: 21 mg/dL (ref 10–36)
CO2: 25 mmol/L (ref 20–29)
Calcium: 9.9 mg/dL (ref 8.7–10.3)
Chloride: 105 mmol/L (ref 96–106)
Creatinine, Ser: 1.54 mg/dL — ABNORMAL HIGH (ref 0.57–1.00)
GFR calc Af Amer: 33 mL/min/{1.73_m2} — ABNORMAL LOW (ref >59)
GFR calc non Af Amer: 29 mL/min/{1.73_m2} — ABNORMAL LOW (ref >59)
Glucose: 89 mg/dL (ref 65–99)
Potassium: 4.6 mmol/L (ref 3.5–5.2)
Sodium: 143 mmol/L (ref 134–144)

## 2019-10-15 LAB — PRO B NATRIURETIC PEPTIDE: NT-Pro BNP: 521 pg/mL (ref 0–738)

## 2019-10-21 ENCOUNTER — Ambulatory Visit (INDEPENDENT_AMBULATORY_CARE_PROVIDER_SITE_OTHER): Payer: 59

## 2019-10-21 DIAGNOSIS — R002 Palpitations: Secondary | ICD-10-CM

## 2019-11-09 ENCOUNTER — Other Ambulatory Visit: Payer: Self-pay | Admitting: Cardiovascular Disease

## 2019-12-09 ENCOUNTER — Ambulatory Visit (INDEPENDENT_AMBULATORY_CARE_PROVIDER_SITE_OTHER): Payer: 59 | Admitting: Cardiovascular Disease

## 2019-12-09 ENCOUNTER — Other Ambulatory Visit: Payer: Self-pay

## 2019-12-09 ENCOUNTER — Encounter: Payer: Self-pay | Admitting: Cardiovascular Disease

## 2019-12-09 VITALS — BP 150/70 | HR 76 | Ht 63.0 in | Wt 200.4 lb

## 2019-12-09 DIAGNOSIS — I10 Essential (primary) hypertension: Secondary | ICD-10-CM | POA: Diagnosis not present

## 2019-12-09 DIAGNOSIS — I350 Nonrheumatic aortic (valve) stenosis: Secondary | ICD-10-CM

## 2019-12-09 MED ORDER — AMLODIPINE BESYLATE 5 MG PO TABS
5.0000 mg | ORAL_TABLET | Freq: Every day | ORAL | 3 refills | Status: DC
Start: 2019-12-09 — End: 2020-10-24

## 2019-12-09 NOTE — Patient Instructions (Signed)
Medication Instructions:  START AMLODIPINE 5 MG 1 EVERY DAY *If you need a refill on your cardiac medications before your next appointment, please call your pharmacy*   Lab Work: NONE If you have labs (blood work) drawn today and your tests are completely normal, you will receive your results only by: Marland Kitchen MyChart Message (if you have MyChart) OR . A paper copy in the mail If you have any lab test that is abnormal or we need to change your treatment, we will call you to review the results.   Testing/Procedures: NONE   Follow-Up: At [facility], you and your health needs are our priority.  As part of our continuing mission to provide you with exceptional heart care, we have created designated Provider Care Teams.  These Care Teams include your primary Cardiologist (physician) and Advanced Practice Providers (APPs -  Physician Assistants and Nurse Practitioners) who all work together to provide you with the care you need, when you need it.  We recommend signing up for the patient portal called "MyChart".  Sign up information is provided on this After Visit Summary.  MyChart is used to connect with patients for Virtual Visits (Telemedicine).  Patients are able to view lab/test results, encounter notes, upcoming appointments, etc.  Non-urgent messages can be sent to your provider as well.   To learn more about what you can do with MyChart, go to [email or web address].    Your next appointment:   6 month(s)  The format for your next appointment:   In Person  Provider:   You will see one of the following Advanced Practice Providers on your designated Care Team:    Tereso Newcomer, [place]       Other Instructions

## 2019-12-09 NOTE — Progress Notes (Signed)
Cardiology Office Note:    Date:  12/09/2019   ID:  [name], DOB 08-20-1923, MRN [id number]  PCP:  Pcp, No  Cardiologist:  Kristeen Miss, MD  Electrophysiologist:  None   Referring MD: No ref. provider found   Chief Complaint  Patient presents with  . Chest Pain  . Aortic Stenosis         [name] is a 86 y.o. female with a hx of mild aortic stenosis, chronic kidney disease stage III, hypertension and history of GI bleed.  She has minimal coronary artery disease by heart catheterization in 2003.  She was recently admitted to the hospital this past summer with episodes of chest pain.  She was thought to be a very poor candidate for invasive/interventional procedures.  She was seen for a return office visit by Lizabeth Leyden, nurse practitioner on October 23, 2017.  Seems to be going better.    The chest pain has improved on medical therapy  Has occasional palpitations  Occasional left leg swelling   Oct. 27, 2021:  [name] is a 86 year old female with a history of mild aortic stenosis, chronic kidney disease, hypertension and history of GI bleed.  She has minimal coronary artery disease by heart catheterization in 2003.  She is seen for follow-up visit today.  BP is a bit elevated today .  She checks her BP at home.  Is typically normal at home  No cp, Had some palpitations. Wore an event monitor  Showed NSR , sinus bradycardia,  Rare episode of nonsustained VT ( 4 beat run of NS VT)  No evidence of afib   Past Medical History:  Diagnosis Date  . Acid reflux    takes Nexium daily  . Anemia    takes Ferrous Sulfate daily  . Aortic stenosis    mild AS pk grad 18, mean grad 9, AVA 1.32 cm 03/2015 echo (Dr. Jacinto Halim)  . Arthritis   . Chronic kidney disease (CKD), stage III (moderate) (H[facility])   . Diverticulitis   . Hematochezia 03/2015  . High cholesterol    can't take the meds  . History of blood transfusion 03/2015   no abnormal reaction noted  . History of GI  diverticular bleed   . HTN (hypertension)    takes Losartan-HCTZ and Metoprolol daily  . Joint pain   . Joint swelling   . Nocturia   . Numbness    in legs  . Peripheral edema    occasionally  . Shortness of breath dyspnea    occasionally and with exertion  . Slow urinary stream    at times    Past Surgical History:  Procedure Laterality Date  . ABCESS DRAINAGE     abdominal abcess  . cataract surgery Bilateral   . CHOLECYSTECTOMY    . DILATION AND CURETTAGE OF UTERUS    . ESOPHAGOGASTRODUODENOSCOPY N/A 02/23/2015   Procedure: ESOPHAGOGASTRODUODENOSCOPY (EGD);  Surgeon: Carman Ching, MD;  Location[facility] ENDOSCOPY;  Service: Endoscopy;  Laterality: N/A;  . FLEXIBLE SIGMOIDOSCOPY N/A 02/24/2015   Procedure: FLEXIBLE SIGMOIDOSCOPY;  Surgeon: Carman Ching, MD;  Location[facility] ENDOSCOPY;  Service: Endoscopy;  Laterality: N/A;  . IR ANGIOGRAM SELECTIVE EACH ADDITIONAL VESSEL  03/06/2017  . IR ANGIOGRAM SELECTIVE EACH ADDITIONAL VESSEL  03/06/2017  . IR ANGIOGRAM SELECTIVE EACH ADDITIONAL VESSEL  03/06/2017  . IR ANGIOGRAM SELECTIVE EACH ADDITIONAL VESSEL  03/08/2017  . IR ANGIOGRAM VISCERAL SELECTIVE  03/06/2017  . IR ANGIOGRAM VISCERAL SELECTIVE  03/06/2017  .  IR ANGIOGRAM VISCERAL SELECTIVE  03/06/2017  . IR ANGIOGRAM VISCERAL SELECTIVE  03/08/2017  . IR ANGIOGRAM VISCERAL SELECTIVE  03/08/2017  . IR EMBO ART  VEN HEMORR LYMPH EXTRAV  INC GUIDE ROADMAPPING  03/06/2017  . IR EMBO ART  VEN HEMORR LYMPH EXTRAV  INC GUIDE ROADMAPPING  03/08/2017  . IR US GUIDE VASC ACCESS RIGHT  03/06/2017  . IR US GUIDE VASC ACCESS RIGHT  03/08/2017  . left knee surgery    . TOTAL HIP ARTHROPLASTY Left 05/17/2015   Procedure: LEFT TOTAL HIP ARTHROPLASTY ANTERIOR APPROACH;  Surgeon: Kathryne Hitchhristopher Y Blackman, MD;  Location: [facility] OR;  Service: Orthopedics;  Laterality: Left;    Current Medications: Current Meds  Medication Sig  . acetaminophen (TYLENOL) 650 MG CR tablet Take 650 mg by mouth daily.  . Cholecalciferol  (VITAMIN D3) 2000 units TABS Take 2,000 Units by mouth daily.  . diclofenac sodium (VOLTAREN) 1 % GEL Apply 2 g topically 2 (two) times daily as needed (pain).   . famotidine (PEPCID) 20 MG tablet Take 20 mg by mouth daily.   . Ferrous Sulfate 27 MG TABS Take 1 tablet by mouth daily.  . isosorbide mononitrate (IMDUR) 120 MG 24 hr tablet Take 1 tablet (120 mg total) by mouth daily.  Marland Kitchen. losartan (COZAAR) 25 MG tablet Take 25 mg by mouth daily.  . metoprolol tartrate (LOPRESSOR) 25 MG tablet Take 12.5 mg by mouth 2 (two) times daily.   . Multiple Vitamin (MULITIVITAMIN WITH MINERALS) TABS Take 1 tablet by mouth daily.  . nitroGLYCERIN (NITROSTAT) 0.4 MG SL tablet Place 0.4 mg under the tongue every 5 (five) minutes as needed for chest pain.   . polyvinyl alcohol (LIQUIFILM TEARS) 1.4 % ophthalmic solution Place 1 drop into both eyes as needed for dry eyes.  Marland Kitchen. PROAIR HFA 108 (90 Base) MCG/ACT inhaler Inhale 1-2 puffs into the lungs every 4 (four) hours as needed for shortness of breath or wheezing.     Allergies:   Ezetimibe, Lipitor [atorvastatin], Pravachol [pravastatin], Statins, Zocor [simvastatin], and Cholestyramine   Social History   Socioeconomic History  . Marital status: Widowed    Spouse name: Not on file  . Number of children: Not on file  . Years of education: Not on file  . Highest education level: Not on file  Occupational History  . Not on file  Tobacco Use  . Smoking status: Former Games developermoker  . Smokeless tobacco: Never Used  . Tobacco comment: quit smoking 8+ yrs ago  Vaping Use  . Vaping Use: Never used  Substance and Sexual Activity  . Alcohol use: No    Comment: quit yrs ago  . Drug use: No  . Sexual activity: Not Currently  Other Topics Concern  . Not on file  Social History Narrative  . Not on file   Social Determinants of Health   Financial Resource Strain:   . Difficulty of Paying Living Expenses: Not on file  Food Insecurity:   . Worried About Community education officerunning  Out of Food in the Last Year: Not on file  . Ran Out of Food in the Last Year: Not on file  Transportation Needs:   . Lack of Transportation (Medical): Not on file  . Lack of Transportation (Non-Medical): Not on file  Physical Activity:   . Days of Exercise per Week: Not on file  . Minutes of Exercise per Session: Not on file  Stress:   . Feeling of Stress : Not on file  Social Connections:   .  Frequency of Communication with Friends and Family: Not on file  . Frequency of Social Gatherings with Friends and Family: Not on file  . Attends Religious Services: Not on file  . Active Member of Clubs or Organizations: Not on file  . Attends Banker Meetings: Not on file  . Marital Status: Not on file     Family History: The patient's family history includes Cancer in an other family member; Diabetes in an other family member; Heart disease in an other family member; Hypertension in an other family member.  ROS:   Please see the history of present illness.     All other systems reviewed and are negative.  EKGs/Labs/Other Studies Reviewed:    The following studies were reviewed today:   EKG:    Recent Labs: 09/17/2019: ALT 11 09/21/2019: Hemoglobin 9.9; Platelets 217 10/14/2019: BUN 21; Creatinine, Ser 1.54; NT-Pro BNP 521; Potassium 4.6; Sodium 143  Recent Lipid Panel    Component Value Date/Time   CHOL (H) 01/06/2009 0149    209        ATP III CLASSIFICATION:  <200     mg/dL   Desirable  950-932  mg/dL   Borderline High  >=671    mg/dL   High          TRIG 51 01/06/2009 0149   HDL 56 01/06/2009 0149   CHOLHDL 3.7 01/06/2009 0149   VLDL 10 01/06/2009 0149   LDLCALC (H) 01/06/2009 0149    143        Total Cholesterol/HDL:CHD Risk Coronary Heart Disease Risk Table                     Men   Women  1/2 Average Risk   3.4   3.3  Average Risk       5.0   4.4  2 X Average Risk   9.6   7.1  3 X Average Risk  23.4   11.0        Use the calculated Patient  Ratio above and the CHD Risk Table to determine the patient's CHD Risk.        ATP III CLASSIFICATION (LDL):  <100     mg/dL   Optimal  245-809  mg/dL   Near or Above                    Optimal  130-159  mg/dL   Borderline  983-382  mg/dL   High  >505     mg/dL   Very High    Physical Exam:    Physical Exam: Blood pressure (!) 150/70, pulse 76, height 5\' 3"  (1.6 m), weight 200 lb 6.4 oz (90.9 kg), SpO2 98 %.  GEN:  Elderly female,  NAD  HEENT: Normal NECK: No JVD; No carotid bruits LYMPHATICS: No lymphadenopathy CARDIAC: RRR  , soft systolic mumur  RESPIRATORY:  Clear to auscultation without rales, wheezing or rhonchi  ABDOMEN: Soft, non-tender, non-distended MUSCULOSKELETAL:  No edema; No deformity  SKIN: Warm and dry NEUROLOGIC:  Alert and oriented x 3   ASSESSMENT:    No diagnosis found. PLAN:    In order of problems listed above:  1. Chest pain : Continue current medications.  She is not had any real episodes of angina.  At her age I would continue with a very conservative approach and not pursue heart catheterization.  2.  Mild aortic stenosis: Continue current meds.  3.  Hypertension: Her blood  pressure is mildly elevated.  We'll add amlodipine 5 mg a day.  She'll follow-up with her primary medical doctor.  We'll have her see Tereso Newcomer in 6 months and I'll see her again in 1 year.   Her echocardiogram from August, 2021 reveals normal left ventricular systolic function.  She has grade 1 diastolic dysfunction.  She has mild aortic stenosis.    Medication Adjustments/Labs and Tests Ordered: Current medicines are reviewed at length with the patient today.  Concerns regarding medicines are outlined above.  No orders of the defined types were placed in this encounter.  Meds ordered this encounter  Medications  . amLODipine (NORVASC) 5 MG tablet    Sig: Take 1 tablet (5 mg total) by mouth daily.    Dispense:  90 tablet    Refill:  3    Patient Instructions   Medication Instructions:  START AMLODIPINE 5 MG 1 EVERY DAY *If you need a refill on your cardiac medications before your next appointment, please call your pharmacy*   Lab Work: NONE If you have labs (blood work) drawn today and your tests are completely normal, you will receive your results only by: Marland Kitchen MyChart Message (if you have MyChart) OR . A paper copy in the mail If you have any lab test that is abnormal or we need to change your treatment, we will call you to review the results.   Testing/Procedures: NONE   Follow-Up: At [facility], you and your health needs are our priority.  As part of our continuing mission to provide you with exceptional heart care, we have created designated Provider Care Teams.  These Care Teams include your primary Cardiologist (physician) and Advanced Practice Providers (APPs -  Physician Assistants and Nurse Practitioners) who all work together to provide you with the care you need, when you need it.  We recommend signing up for the patient portal called "MyChart".  Sign up information is provided on this After Visit Summary.  MyChart is used to connect with patients for Virtual Visits (Telemedicine).  Patients are able to view lab/test results, encounter notes, upcoming appointments, etc.  Non-urgent messages can be sent to your provider as well.   To learn more about what you can do with MyChart, go to [email or web address].    Your next appointment:   6 month(s)  The format for your next appointment:   In Person  Provider:   You will see one of the following Advanced Practice Providers on your designated Care Team:    Tereso Newcomer, [place]       Other Instructions      Signed, Kristeen Miss, MD  12/09/2019 10:43 AM    [facility]

## 2020-02-02 ENCOUNTER — Other Ambulatory Visit: Payer: Self-pay

## 2020-02-02 ENCOUNTER — Ambulatory Visit (INDEPENDENT_AMBULATORY_CARE_PROVIDER_SITE_OTHER): Payer: 59 | Admitting: Student in an Organized Health Care Education/Training Program

## 2020-02-02 VITALS — BP 138/78 | HR 80 | Ht 63.0 in | Wt 201.4 lb

## 2020-02-02 DIAGNOSIS — Z23 Encounter for immunization: Secondary | ICD-10-CM | POA: Diagnosis not present

## 2020-02-02 DIAGNOSIS — R1012 Left upper quadrant pain: Secondary | ICD-10-CM

## 2020-02-02 DIAGNOSIS — N1831 Chronic kidney disease, stage 3a: Secondary | ICD-10-CM

## 2020-02-02 DIAGNOSIS — Z7689 Persons encountering health services in other specified circumstances: Secondary | ICD-10-CM

## 2020-02-02 MED ORDER — SIMETHICONE 80 MG PO CHEW
80.0000 mg | CHEWABLE_TABLET | Freq: Four times a day (QID) | ORAL | 0 refills | Status: DC | PRN
Start: 2020-02-02 — End: 2020-09-02

## 2020-02-02 NOTE — Progress Notes (Signed)
   SUBJECTIVE:   CHIEF COMPLAINT / HPI:  Establishing care.   Ms. Skelton is a 86 year old female who is independently living and active. She has not seen a primary care provider for several years and presents today to establish care. PMH = anemia, arthritis, GERD, HTN, HLD, LBBB, aortic stenosis, diverticulosis, H/0 GI bleed, left hemiparesis, CKD 3, hiatal hernia PSH = total knee and hip replacement on left, 2 abdominal surgeries for ectopic pregnancy and infection. Allergies include as ezetimibe, statins, cholestyramine Medications reviewed and she does not need any refills at this time. SH: lives alone Former smoker, quit 20 years ago, smoked for greater than 40 years Drinks liquor on the weekends, no illicit drugs Not sexually active Enjoys word puzzles Walks for exercise Emergency contact is daughter: Helen Hashimoto [employer] religion  UTD on screenings except for dexa scan.   Today she states that she is feeling well overall and has no major complaints. She has chronic tingly fingers which is worse at night. Has not tried any medication for this. She also has colicky left upper quadrant pain that happens intermittently throughout the past several years.  This is associated with a lot of belching and gas.  She does have a heart doctor that she follows with regularly (last visit 10/27, added amlodipine for elevated BP and 60mo f/u planned) but does not have a kidney doctor. Recent BMP showed mild worsening of kidney function from baseline  OBJECTIVE:   BP 138/78   Pulse 80   Ht 5\' 3"  (1.6 m)   Wt 201 lb 6.4 oz (91.4 kg)   SpO2 97%   BMI 35.68 kg/m   General: NAD, pleasant, able to participate in exam, ambulatory Heent: neg cervical lymphadenopathy Cardiac: RRR, normal heart sounds, significant systolic murmur. 2+ radial and PT pulses bilaterally Respiratory: CTAB, normal effort, No wheezes, rales or rhonchi Abdomen: soft,multiple surgical scars, nondistended, no hepatic or  splenomegaly, +BS, tenderness to LUQ-palpate firm tissue which could be scar or mesh under superficial layer, no rebound Extremities: no edema. WWP. MSK: Patient sit to stand less than 2 seconds with mild assistance from hands.  Can ambulate without support Skin: warm and dry, no rashes noted Neuro: alert and oriented, no focal deficits Psych: Normal affect and mood  ASSESSMENT/PLAN:   Encounter to establish care Establishing care today. Reviewed PMH, medications with patient. She is up to date on health maintenance recommendations for her age High dose flu vaccine given today  CKD (chronic kidney disease) stage 3, GFR 30-59 ml/min Good UOP. No swelling or urinary symptoms.  Appeared to have AKI at recent lab check. F/u BMP in 3 months to monitor  LUQ pain Tenderness with palpation but not acutely painful. In combination with description of excessive belching and colicky nature of pain, suspect that it is gas related. Patient has h/o multiple abdominal surgeries so maintain suspicion for SBO. H/o hiatal hernia and reflux. Denies N/V, difficulty with eating, constipation, blood in stool - continue GERD treatments - add on gas-x to see if this improves symptoms - consider vs CT if continues to be bothersome.      [place], DO [facility]

## 2020-02-02 NOTE — Patient Instructions (Signed)
It was a pleasure to see you today!  To summarize our discussion for this visit:  I'm glad that you chose me to help take care of your health. You look very healthy today.  I will refill your medications  I sent in an additional medication to help treat gas. I would like to see if that helps with your pain and belching. We can follow up for different options if that doesn't do the trick.  We gave you a high dose flu shot today.  Some additional health maintenance measures we should update are: Health Maintenance Due  Topic Date Due  . DEXA SCAN  Never done  . INFLUENZA VACCINE  09/13/2019  .    Please return to our clinic to see me in about 4 months or sooner if you need anything.  Call the clinic at [phone] if your symptoms worsen or you have any concerns.   Thank you for allowing me to take part in your care,  Dr. Jamelle Rushing

## 2020-02-03 DIAGNOSIS — Z7689 Persons encountering health services in other specified circumstances: Secondary | ICD-10-CM | POA: Insufficient documentation

## 2020-02-03 DIAGNOSIS — R1012 Left upper quadrant pain: Secondary | ICD-10-CM | POA: Insufficient documentation

## 2020-02-03 NOTE — Assessment & Plan Note (Signed)
Good UOP. No swelling or urinary symptoms.  Appeared to have AKI at recent lab check. F/u BMP in 3 months to monitor

## 2020-02-03 NOTE — Assessment & Plan Note (Signed)
Tenderness with palpation but not acutely painful. In combination with description of excessive belching and colicky nature of pain, suspect that it is gas related. Patient has h/o multiple abdominal surgeries so maintain suspicion for SBO. H/o hiatal hernia and reflux. Denies N/V, difficulty with eating, constipation, blood in stool - continue GERD treatments - add on gas-x to see if this improves symptoms - consider [place] vs CT if continues to be bothersome.

## 2020-02-03 NOTE — Assessment & Plan Note (Addendum)
Establishing care today. Reviewed PMH, medications with patient. She is up to date on health maintenance recommendations for her age High dose flu vaccine given today

## 2020-02-10 ENCOUNTER — Other Ambulatory Visit: Payer: Self-pay | Admitting: *Deleted

## 2020-02-10 MED ORDER — METOPROLOL TARTRATE 25 MG PO TABS: 12.5000 mg | ORAL_TABLET | Freq: Two times a day (BID) | ORAL | 0 refills | Status: DC

## 2020-02-29 ENCOUNTER — Ambulatory Visit: Payer: Self-pay | Admitting: Internal Medicine

## 2020-05-08 ENCOUNTER — Other Ambulatory Visit: Payer: Self-pay | Admitting: Family Medicine

## 2020-06-08 ENCOUNTER — Encounter: Payer: Self-pay | Admitting: Physician Assistant

## 2020-06-08 ENCOUNTER — Encounter (INDEPENDENT_AMBULATORY_CARE_PROVIDER_SITE_OTHER): Payer: Self-pay

## 2020-06-08 ENCOUNTER — Other Ambulatory Visit: Payer: Self-pay

## 2020-06-08 ENCOUNTER — Ambulatory Visit (INDEPENDENT_AMBULATORY_CARE_PROVIDER_SITE_OTHER): Payer: 59 | Admitting: Physician Assistant

## 2020-06-08 VITALS — BP 158/70 | HR 77 | Ht 63.0 in | Wt 203.4 lb

## 2020-06-08 DIAGNOSIS — N1831 Chronic kidney disease, stage 3a: Secondary | ICD-10-CM | POA: Diagnosis not present

## 2020-06-08 DIAGNOSIS — I10 Essential (primary) hypertension: Secondary | ICD-10-CM

## 2020-06-08 DIAGNOSIS — R600 Localized edema: Secondary | ICD-10-CM

## 2020-06-08 DIAGNOSIS — I350 Nonrheumatic aortic (valve) stenosis: Secondary | ICD-10-CM | POA: Diagnosis not present

## 2020-06-08 DIAGNOSIS — R002 Palpitations: Secondary | ICD-10-CM

## 2020-06-08 DIAGNOSIS — I25118 Atherosclerotic heart disease of native coronary artery with other forms of angina pectoris: Secondary | ICD-10-CM | POA: Diagnosis not present

## 2020-06-08 MED ORDER — FUROSEMIDE 20 MG PO TABS
20.0000 mg | ORAL_TABLET | ORAL | 1 refills | Status: DC | PRN
Start: 2020-06-08 — End: 2020-07-26

## 2020-06-08 MED ORDER — METOPROLOL TARTRATE 25 MG PO TABS: 25.0000 mg | ORAL_TABLET | Freq: Two times a day (BID) | ORAL | 3 refills | Status: DC

## 2020-06-08 NOTE — Progress Notes (Signed)
Cardiology Office Note:    Date:  06/08/2020   ID:  [name], DOB 01/09/1924, MRN [id number]  PCP:  Richarda Osmond DO   [place]  Cardiologist:  Mertie Moores, MD   Advanced Practice Provider:  Liliane Shi, PA-C Electrophysiologist:  None       Referring MD: Richarda Osmond, DO   Chief Complaint:  Follow-up (CAD, AS)    Patient Profile:    [name] is a 87 y.o. female with:   Coronary artery disease  ? Non-obstructive by cath in 2010 ? admx 09/2017 w/ CP >> med Rx due to age, CKD  Aortic stenosis  ? Mild-Echo 09/2017: Mean gradient 11 mmHg  Chronic kidney disease 3   Hypertension   Prior GI bleeding  LBBB  Prior CV studies: LONG TERM MONITOR (8-14 DAYS) INTERPRETATION 11/12/2019 Narrative  Sinus rhythm including NSR and sinus bradycardia  Slowest HR recorded was 34 at 2:48 PMnon Sept. 21, 2021  She had a 4 beat run of slow ventricular tachycardia ( HR of 104 ) which was not symptomatic  Carotid US 09/18/2019 Bilateral ICA 1-39  Echocardiogram 09/18/2019 EF 60-65, normal wall motion, mild LVH, GR 1 DD normal RVSF, RVSP 52.6 (moderately elevated), moderate LAE, mild RAE, mild MR, mild aortic stenosis (mean gradient 13.2 mmHg)  Cardiac monitor 08/2018 Sinus rhythm, sinus bradycardia, rare PVCs  Echocardiogram 09/30/2017 EF 55-60, normal wall motion, grade 2 diastolic dysfunction, mild aortic stenosis (mean 11), MAC, moderate LAE, mild RAE, mild TR, PASP 33  Cardiac catheterization 01/08/2009 EF 55-60 LAD proximal 15-20, mid 30-40; D2 ostial 20 LCx proximal 20-25, distal 40-50 RCA proximal 20-25, mid 50-55, distal 20-25  History of Present Illness: [name] was last seen by Dr. Acie Fredrickson in 10/21.  She returns for f/u.  She is here alone.  She notes dyspnea on exertion with minimal activities with assoc chest pressure and palpitations.  She did not wear the last heart monitor very long.  It caused skin  irritation and fell off.  She feels her symptoms are progressively worsening. She has not had syncope, orthopnea.  She has chronic leg edema. This improves with elevated. NT-Pro BNP has been normal in the past.          Past Medical History:  Diagnosis Date  . Acid reflux    takes Nexium daily  . Anemia    takes Ferrous Sulfate daily  . Aortic stenosis    mild AS pk grad 18, mean grad 9, AVA 1.32 cm 03/2015 echo (Dr. Einar Gip)  . Arthritis   . Chronic kidney disease (CKD), stage III (moderate) (HCC)   . Diverticulitis   . Hematochezia 03/2015  . High cholesterol    can't take the meds  . History of blood transfusion 03/2015   no abnormal reaction noted  . History of GI diverticular bleed   . HTN (hypertension)    takes Losartan-HCTZ and Metoprolol daily  . Joint pain   . Joint swelling   . Nocturia   . Numbness    in legs  . Peripheral edema    occasionally  . Shortness of breath dyspnea    occasionally and with exertion  . Slow urinary stream    at times    Current Medications: Current Meds  Medication Sig  . acetaminophen (TYLENOL) 650 MG CR tablet Take 650 mg by mouth daily.  Marland Kitchen amLODipine (NORVASC) 5 MG tablet Take 1 tablet (5 mg total) by  mouth daily.  . Cholecalciferol (VITAMIN D3) 2000 units TABS Take 2,000 Units by mouth daily.  . diclofenac sodium (VOLTAREN) 1 % GEL Apply 2 g topically 2 (two) times daily as needed (pain).   . famotidine (PEPCID) 20 MG tablet Take 20 mg by mouth daily.   . Ferrous Sulfate 27 MG TABS Take 1 tablet by mouth daily.  . furosemide (LASIX) 20 MG tablet Take 1 tablet (20 mg total) by mouth as needed for edema.  . isosorbide mononitrate (IMDUR) 120 MG 24 hr tablet Take 1 tablet (120 mg total) by mouth daily.  Marland Kitchen losartan (COZAAR) 25 MG tablet Take 25 mg by mouth daily.  . Multiple Vitamin (MULITIVITAMIN WITH MINERALS) TABS Take 1 tablet by mouth daily.  . nitroGLYCERIN (NITROSTAT) 0.4 MG SL tablet Place 0.4 mg under the tongue every 5 (five)  minutes as needed for chest pain.   . polyvinyl alcohol (LIQUIFILM TEARS) 1.4 % ophthalmic solution Place 1 drop into both eyes as needed for dry eyes.  Marland Kitchen PROAIR HFA 108 (90 Base) MCG/ACT inhaler Inhale 1-2 puffs into the lungs every 4 (four) hours as needed for shortness of breath or wheezing.  . simethicone (GAS-X) 80 MG chewable tablet Chew 1 tablet (80 mg total) by mouth every 6 (six) hours as needed for flatulence.  . [DISCONTINUED] metoprolol tartrate (LOPRESSOR) 25 MG tablet Take 0.5 tablets (12.5 mg total) by mouth 2 (two) times daily.     Allergies:   Ezetimibe, Lipitor [atorvastatin], Pravachol [pravastatin], Statins, Zocor [simvastatin], and Cholestyramine   Social History   Tobacco Use  . Smoking status: Former Research scientist (life sciences)  . Smokeless tobacco: Never Used  . Tobacco comment: quit smoking 8+ yrs ago  Vaping Use  . Vaping Use: Never used  Substance Use Topics  . Alcohol use: No    Comment: quit yrs ago  . Drug use: No     Family Hx: The patient's family history includes Cancer in an other family member; Diabetes in an other family member; Heart disease in an other family member; Hypertension in an other family member.  Review of Systems  Gastrointestinal: Negative for hematochezia.  Genitourinary: Negative for hematuria.     EKGs/Labs/Other Test Reviewed:    EKG:  EKG is  not ordered today.  The ekg ordered today demonstrates n/a  Recent Labs: 09/17/2019: ALT 11 09/21/2019: Hemoglobin 9.9; Platelets 217 10/14/2019: BUN 21; Creatinine, Ser 1.54; NT-Pro BNP 521; Potassium 4.6; Sodium 143     Risk Assessment/Calculations:      Physical Exam:    VS:  BP (!) 158/70   Pulse 77   Ht _0  (1.6 m)   Wt 203 lb 6.4 oz (92.3 kg)   SpO2 96%   BMI 36.03 kg/m     Wt Readings from Last 3 Encounters:  06/08/20 203 lb 6.4 oz (92.3 kg)  02/02/20 201 lb 6.4 oz (91.4 kg)  12/09/19 200 lb 6.4 oz (90.9 kg)     Constitutional:      Appearance: Healthy appearance. Not in  distress.  Pulmonary:     Effort: Pulmonary effort is normal.     Breath sounds: No wheezing. No rales.  Cardiovascular:     Normal rate. Regular rhythm. Normal S1. Normal S2.     Murmurs: There is a grade 2/6 crescendo-decrescendo systolic murmur at the URSB.  Edema:    Pretibial: bilateral trace edema of the pretibial area.    Ankle: trace edema of the left ankle and 1+ edema of  the right ankle. Abdominal:     Palpations: Abdomen is soft.  Musculoskeletal:     Cervical back: Neck supple. Skin:    General: Skin is warm and dry.  Neurological:     Mental Status: Alert and oriented to person, place and time.     Cranial Nerves: Cranial nerves are intact.         ASSESSMENT & PLAN:    1. Coronary artery disease of native artery of native heart with stable angina pectoris [facility]) She had nonobstructive disease by cardiac catheterization in 2010.  She has been managed medically for angina since 2019.  Cardiac catheterization is being avoided due to advanced age and chronic kidney disease.  She seems to be having progressively worsening symptoms.  She also has assoc palpitations.  I will advance her before to see if her symptoms improve with this.  Increase metoprolol tartrate to 25 mg twice daily.  Continue current dose of amlodipine, isosorbide.  She is not on ASA due to hx of GI bleed.  F/u with me in 6-8 weeks.   2. Nonrheumatic aortic valve stenosis Mild AS by Echocardiogram in 2021. Consider repeat echocardiogram in 1-2 years.   3. Stage 3a chronic kidney disease ([place]) Kidney function has remained stable.   4. Essential hypertension BP above target.  Increase metoprolol tartrate as noted.  Consider increasing amlodipine or isosorbide if BP remains high.    5. Leg edema She has some improvement with elevation.  I think she likely has all venous insufficiency.  However, she has diastolic dysfunction on echocardiogram and may have a component of (HFpEF) heart failure with preserved  ejection fraction.  BNP has been normal in the past.  Start Furosemide 20 mg once daily prn increasing edema.  Get f/u BMET if she takes furosemide > 2 days in a row.  Will see if her breathing is better on furosemide days at f/u.  If so, consider taking furosemide daily.    6. Palpitations She continues to have rapid palpitations.  She did not have significant arrhythmia on the last monitor.  She only wore it for a few days.  If she has continued symptoms despite increasing her beta-blocker, we can get a 30 day event monitor.       Dispo:  Return in about 7 weeks (around 07/27/2020) for Routine follow up 7 weeks with Richardson Dopp, PA..   Medication Adjustments/Labs and Tests Ordered: Current medicines are reviewed at length with the patient today.  Concerns regarding medicines are outlined above.  Tests Ordered: No orders of the defined types were placed in this encounter.  Medication Changes: Meds ordered this encounter  Medications  . metoprolol tartrate (LOPRESSOR) 25 MG tablet    Sig: Take 1 tablet (25 mg total) by mouth 2 (two) times daily.    Dispense:  180 tablet    Refill:  3  . furosemide (LASIX) 20 MG tablet    Sig: Take 1 tablet (20 mg total) by mouth as needed for edema.    Dispense:  30 tablet    Refill:  1    Signed, Richardson Dopp, PA-C  06/08/2020 2:23 PM    [facility] [address], [place], [place]  [address] Phone: [phone]; Fax: [phone]

## 2020-06-08 NOTE — Patient Instructions (Addendum)
Medication Instructions:  Your physician has recommended you make the following change in your medication:   1. Increase Metoprolol one tablet by mouth ( 25 mg) twice daily.   2.  Start Lasix one tablet by mouth ( 20 mg) ONLY FOR LEG SWELLING, IF YOU TAKE MORE THAN TWO DAYS CALL OFFICE [phone].  *If you need a refill on your cardiac medications before your next appointment, please call your pharmacy*   Lab Work: NONE   If you have labs (blood work) drawn today and your tests are completely normal, you will receive your results only by: Marland Kitchen MyChart Message (if you have MyChart) OR . A paper copy in the mail If you have any lab test that is abnormal or we need to change your treatment, we will call you to review the results.   Testing/Procedures: NONE   Follow-Up: At [facility], you and your health needs are our priority.  As part of our continuing mission to provide you with exceptional heart care, we have created designated Provider Care Teams.  These Care Teams include your primary Cardiologist (physician) and Advanced Practice Providers (APPs -  Physician Assistants and Nurse Practitioners) who all work together to provide you with the care you need, when you need it.  We recommend signing up for the patient portal called "MyChart".  Sign up information is provided on this After Visit Summary.  MyChart is used to connect with patients for Virtual Visits (Telemedicine).  Patients are able to view lab/test results, encounter notes, upcoming appointments, etc.  Non-urgent messages can be sent to your provider as well.   To learn more about what you can do with MyChart, go to [email or web address].    Your next appointment:   7 week(s) WITH [place], [place] ON Tuesday, June 14 @ 8:45 AM  The format for your next appointment:   In Person  Provider:   Tereso Newcomer, PA-C   Other Instructions -NONE

## 2020-06-14 ENCOUNTER — Other Ambulatory Visit: Payer: Self-pay

## 2020-06-14 ENCOUNTER — Encounter: Payer: Self-pay | Admitting: Student in an Organized Health Care Education/Training Program

## 2020-06-14 ENCOUNTER — Ambulatory Visit (INDEPENDENT_AMBULATORY_CARE_PROVIDER_SITE_OTHER): Payer: 59 | Admitting: Student in an Organized Health Care Education/Training Program

## 2020-06-14 VITALS — BP 132/70 | HR 65 | Ht 63.0 in | Wt 204.0 lb

## 2020-06-14 DIAGNOSIS — M545 Low back pain, unspecified: Secondary | ICD-10-CM | POA: Diagnosis not present

## 2020-06-14 DIAGNOSIS — Z0000 Encounter for general adult medical examination without abnormal findings: Secondary | ICD-10-CM | POA: Diagnosis not present

## 2020-06-14 DIAGNOSIS — Z9181 History of falling: Secondary | ICD-10-CM | POA: Diagnosis not present

## 2020-06-14 DIAGNOSIS — Z748 Other problems related to care provider dependency: Secondary | ICD-10-CM

## 2020-06-14 DIAGNOSIS — N1831 Chronic kidney disease, stage 3a: Secondary | ICD-10-CM | POA: Diagnosis not present

## 2020-06-14 MED ORDER — SHINGRIX 50 MCG/0.5ML IM SUSR: 0.5000 mL | Freq: Once | INTRAMUSCULAR | 0 refills | Status: AC

## 2020-06-14 NOTE — Progress Notes (Signed)
SUBJECTIVE:   CHIEF COMPLAINT / HPI: f/u CKD, abdominal pain  CKD- patient endorses still having normal UOP. She has mild ankle swelling but not worsened from baseline and improves with elevation.  Lumbar back pain-chronic and unchanged. tylenol x1 every day. Pain is intermittent for 15-20 minutes. voltaren gel and lidocaine patch help. Also wears a brace sometimes.  Open to physical therapy.   Transportation issues- License expires at age 87 and she doesn't think she'll be able to renew it.  She is able to get rides to doctors appointments with [employer]. They haven't shown up twice and made her miss appointments so is apprehensive to use them again.  Due for shingles vaccine and requesting today.  Last echo: Grade I diastolic dysfunction, EF 60-65%  OBJECTIVE:   BP 132/70   Pulse 65   Ht 5\' 3"  (1.6 m)   Wt 204 lb (92.5 kg)   SpO2 97%   BMI 36.14 kg/m   Physical Exam Vitals and nursing note reviewed. Exam conducted with a chaperone present.  HENT:     Head: Normocephalic.     Mouth/Throat:     Mouth: Mucous membranes are moist.  Eyes:     General:        Right eye: No discharge.        Left eye: No discharge.     Conjunctiva/sclera: Conjunctivae normal.  Cardiovascular:     Rate and Rhythm: Normal rate and regular rhythm.     Heart sounds: Murmur (systolic ejection murmur) heard.    Pulmonary:     Effort: Pulmonary effort is normal.     Breath sounds: Normal breath sounds.  Musculoskeletal:     Lumbar back: No tenderness or bony tenderness. Negative right straight leg raise test and negative left straight leg raise test.     Right lower leg: Edema present.     Left lower leg: Edema present.  Skin:    General: Skin is warm and dry.     Capillary Refill: Capillary refill takes less than 2 seconds.  Neurological:     General: No focal deficit present.     Mental Status: She is alert and oriented to person, place, and time. Mental status is at baseline.   Psychiatric:        Mood and Affect: Mood normal.        Behavior: Behavior normal.    ASSESSMENT/PLAN:   CKD (chronic kidney disease) stage 3, GFR 30-59 ml/min BMP today to monitor kidney function. Patient is symptomatically stable.  Lumbar back pain Chronic and unchanged.  Patient is using daily tylenol, topical voltaren and lidocaine patches which all help. She states that it does not have big impact on daily activities as she does not do much.  - referral for physical therapy today to help with strength, pain, and gait/balance - consider imaging of back to r/o compression fx if not improving with therapy  Assistance needed with transportation Discussed with patient the concerns with [employer] getting her too late for her appointments and having to miss multiple doctors appointments due to their schedule.  Asked patient if she could tell them an earlier time next time to see if that would help her get to appointments on time and she agreed that was what her plan was  Healthcare maintenance Patient brings in form from her wellness nurse visit this year with recommendations.  We discussed a lipid panel including indications and what we would do if resulted and decided  to forgo that recommendation. Provided her with prescription for shingles vaccine and provided directions to get at her pharmacy     Leeroy Bock, DO [facility]

## 2020-06-14 NOTE — Patient Instructions (Signed)
It was a pleasure to see you today!  To summarize our discussion for this visit:  For your back pain, you can continue tylenol as needed as well as the lidocaine patches and voltaren gel. I would also recommend physical therapy. This can help with your strength, balance, and pain.  We are checking your kidneys today.   Please come back in 2 months to recheck your kidneys.  Let me know if you are having worsening of your leg swelling.   You can get your shingles vaccine from pharmacy.   Some additional health maintenance measures we should update are: Health Maintenance Due  Topic Date Due  . DEXA SCAN  Never done  .    Call the clinic at [phone] if your symptoms worsen or you have any concerns.   Thank you for allowing me to take part in your care,  Dr. Jamelle Rushing

## 2020-06-15 LAB — BASIC METABOLIC PANEL
BUN/Creatinine Ratio: 14 (ref 12–28)
BUN: 19 mg/dL (ref 10–36)
CO2: 25 mmol/L (ref 20–29)
Calcium: 9.3 mg/dL (ref 8.7–10.3)
Chloride: 102 mmol/L (ref 96–106)
Creatinine, Ser: 1.35 mg/dL — ABNORMAL HIGH (ref 0.57–1.00)
Glucose: 106 mg/dL — ABNORMAL HIGH (ref 65–99)
Potassium: 4.9 mmol/L (ref 3.5–5.2)
Sodium: 141 mmol/L (ref 134–144)
eGFR: 36 mL/min/{1.73_m2} — ABNORMAL LOW (ref >59)

## 2020-06-16 DIAGNOSIS — Z748 Other problems related to care provider dependency: Secondary | ICD-10-CM

## 2020-06-16 DIAGNOSIS — M545 Low back pain, unspecified: Secondary | ICD-10-CM | POA: Insufficient documentation

## 2020-06-16 DIAGNOSIS — Z0000 Encounter for general adult medical examination without abnormal findings: Secondary | ICD-10-CM | POA: Insufficient documentation

## 2020-06-16 HISTORY — DX: Other problems related to care provider dependency: Z74.8

## 2020-06-16 HISTORY — DX: Low back pain, unspecified: M54.50

## 2020-06-16 NOTE — Assessment & Plan Note (Signed)
BMP today to monitor kidney function. Patient is symptomatically stable.

## 2020-06-16 NOTE — Assessment & Plan Note (Signed)
Chronic and unchanged.  Patient is using daily tylenol, topical voltaren and lidocaine patches which all help. She states that it does not have big impact on daily activities as she does not do much.  - referral for physical therapy today to help with strength, pain, and gait/balance - consider imaging of back to r/o compression fx if not improving with therapy

## 2020-06-16 NOTE — Assessment & Plan Note (Signed)
Patient brings in form from her wellness nurse visit this year with recommendations.  We discussed a lipid panel including indications and what we would do if resulted and decided to forgo that recommendation. Provided her with prescription for shingles vaccine and provided directions to get at her pharmacy

## 2020-06-16 NOTE — Assessment & Plan Note (Signed)
Discussed with patient the concerns with [employer] getting her too late for her appointments and having to miss multiple doctors appointments due to their schedule.  Asked patient if she could tell them an earlier time next time to see if that would help her get to appointments on time and she agreed that was what her plan was

## 2020-06-17 ENCOUNTER — Encounter: Payer: Self-pay | Admitting: Student in an Organized Health Care Education/Training Program

## 2020-07-12 ENCOUNTER — Other Ambulatory Visit: Payer: Self-pay

## 2020-07-12 ENCOUNTER — Encounter: Payer: Self-pay | Admitting: Physical Therapy

## 2020-07-12 ENCOUNTER — Ambulatory Visit: Payer: 59 | Attending: Family Medicine | Admitting: Physical Therapy

## 2020-07-12 DIAGNOSIS — R2689 Other abnormalities of gait and mobility: Secondary | ICD-10-CM | POA: Diagnosis present

## 2020-07-12 DIAGNOSIS — G8929 Other chronic pain: Secondary | ICD-10-CM | POA: Insufficient documentation

## 2020-07-12 DIAGNOSIS — M545 Low back pain, unspecified: Secondary | ICD-10-CM

## 2020-07-12 DIAGNOSIS — M6281 Muscle weakness (generalized): Secondary | ICD-10-CM

## 2020-07-12 NOTE — Therapy (Signed)
[facility] Outpatient Rehabilitation [facility] [address] [place], [place], [address] Phone: [phone]   Fax:  [phone]  Physical Therapy Evaluation  Patient Details  Name: [name] MRN: [id number] Date of Birth: 1923-09-24 Referring Provider (PT): Moses Manners, MD   Encounter Date: 07/12/2020   PT End of Session - 07/12/20 1530    Visit Number 1    Number of Visits 16    Date for PT Re-Evaluation 09/06/20    Authorization Type MCR    Progress Note Due on Visit 10    PT Start Time 0245    PT Stop Time 0330    PT Time Calculation (min) 45 min    Equipment Utilized During Treatment Gait belt    Activity Tolerance Patient tolerated treatment well    Behavior During Therapy [facility] for tasks assessed/performed           Past Medical History:  Diagnosis Date  . Acid reflux    takes Nexium daily  . Anemia    takes Ferrous Sulfate daily  . Aortic stenosis    mild AS pk grad 18, mean grad 9, AVA 1.32 cm 03/2015 echo (Dr. Jacinto Halim)  . Arthritis   . Chronic kidney disease (CKD), stage III (moderate) (HCC)   . Diverticulitis   . Hematochezia 03/2015  . High cholesterol    can't take the meds  . History of blood transfusion 03/2015   no abnormal reaction noted  . History of GI diverticular bleed   . HTN (hypertension)    takes Losartan-HCTZ and Metoprolol daily  . Joint pain   . Joint swelling   . Nocturia   . Numbness    in legs  . Peripheral edema    occasionally  . Shortness of breath dyspnea    occasionally and with exertion  . Slow urinary stream    at times    Past Surgical History:  Procedure Laterality Date  . ABCESS DRAINAGE     abdominal abcess  . cataract surgery Bilateral   . CHOLECYSTECTOMY    . DILATION AND CURETTAGE OF UTERUS    . ESOPHAGOGASTRODUODENOSCOPY N/A 02/23/2015   Procedure: ESOPHAGOGASTRODUODENOSCOPY (EGD);  Surgeon: Carman Ching, MD;  Location: [facility] ENDOSCOPY;  Service: Endoscopy;  Laterality: N/A;  .  FLEXIBLE SIGMOIDOSCOPY N/A 02/24/2015   Procedure: FLEXIBLE SIGMOIDOSCOPY;  Surgeon: Carman Ching, MD;  Location: [facility] ENDOSCOPY;  Service: Endoscopy;  Laterality: N/A;  . IR ANGIOGRAM SELECTIVE EACH ADDITIONAL VESSEL  03/06/2017  . IR ANGIOGRAM SELECTIVE EACH ADDITIONAL VESSEL  03/06/2017  . IR ANGIOGRAM SELECTIVE EACH ADDITIONAL VESSEL  03/06/2017  . IR ANGIOGRAM SELECTIVE EACH ADDITIONAL VESSEL  03/08/2017  . IR ANGIOGRAM VISCERAL SELECTIVE  03/06/2017  . IR ANGIOGRAM VISCERAL SELECTIVE  03/06/2017  . IR ANGIOGRAM VISCERAL SELECTIVE  03/06/2017  . IR ANGIOGRAM VISCERAL SELECTIVE  03/08/2017  . IR ANGIOGRAM VISCERAL SELECTIVE  03/08/2017  . IR EMBO ART  VEN HEMORR LYMPH EXTRAV  INC GUIDE ROADMAPPING  03/06/2017  . IR EMBO ART  VEN HEMORR LYMPH EXTRAV  INC GUIDE ROADMAPPING  03/08/2017  . IR US GUIDE VASC ACCESS RIGHT  03/06/2017  . IR US GUIDE VASC ACCESS RIGHT  03/08/2017  . left knee surgery    . TOTAL HIP ARTHROPLASTY Left 05/17/2015   Procedure: LEFT TOTAL HIP ARTHROPLASTY ANTERIOR APPROACH;  Surgeon: Kathryne Hitch, MD;  Location: [facility] OR;  Service: Orthopedics;  Laterality: Left;    There were no vitals filed for this visit.  Subjective Assessment - 07/12/20 1445    Subjective CC: Low back pain.  History of L hip replacement ~5 years ago and feels like this might have caused and increase in her low back pain.  Denies trauma.  Denies LE n/t, no BB changes, denies red flags.  Diffuse low back which is worse with acivity.  Increases the longer she is up and reduces when she sits down.    Pertinent History HTN, FALL RISK, L THA, CKD    Limitations Lifting;Standing;Walking    How long can you stand comfortably? 5 min    How long can you walk comfortably? 1-2 min d/t weakness    Patient Stated Goals Get low back stronger and less painful    Currently in Pain? Yes    Pain Score 8    at worst with activity   Pain Location Back    Pain Orientation Lower    Pain Descriptors / Indicators  Aching;Sharp    Pain Type Chronic pain    Pain Onset More than a month ago    Pain Frequency Intermittent    Aggravating Factors  activity: lifting, walking, standing    Pain Relieving Factors sitting, rest    Effect of Pain on Daily Activities difficult to complete ADLs             [facility] PT Assessment - 07/12/20 0001      Assessment   Medical Diagnosis At high risk for injury related to fall (Z91.81), Lumbar back pain (M54.50)    Referring Provider (PT) Moses Manners, MD    Onset Date/Surgical Date 07/13/15    Hand Dominance Right    Next MD Visit June 14    Prior Therapy aquatic therapy for back multiple years ago.      Precautions   Precaution Comments FALL RISK      Balance Screen   Has the patient fallen in the past 6 months No      Home Environment   Living Environment Private residence    Living Arrangements Alone    Available Help at Discharge Family    Type of Home House    Home Access Stairs to enter    Entrance Stairs-Number of Steps 3    Entrance Stairs-Rails Right    Home Layout One level    Home Equipment Walker - 2 wheels;Cane - single point      Prior Function   Level of Independence Independent      Sensation   Light Touch Appears Intact      Functional Tests   Functional tests --   unable to complete supine bridge d/t hip extensor weakness     Posture/Postural Control   Posture Comments anterior pelvic tilt, loss of lumbar lordosis, forward flexed posture      AROM   AROM Assessment Site Lumbar    Lumbar Flexion WNL    Lumbar Extension reduced > 75%    Lumbar - Right Side Bend Reduced >50%    Lumbar - Left Side Bend Reduced >50%    Lumbar - Right Rotation Reduced 75%    Lumbar - Left Rotation Reduced 75%      Strength   Strength Assessment Site Knee;Hip;Ankle    Right/Left Hip Right;Left    Right Hip Flexion 3+/5    Right Hip ABduction 3+/5    Left Hip Flexion 4/5    Left Hip ABduction 3+/5    Right/Left Knee Right;Left     Right Knee Flexion 4/5  Right Knee Extension 3+/5    Left Knee Flexion 4/5    Left Knee Extension 4/5    Right/Left Ankle Right;Left    Right Ankle Dorsiflexion 4/5    Left Ankle Dorsiflexion 4/5      Palpation   Palpation comment TTP bilateral lumbar paraspinals      Transfers   Comments 30'' STS: 7x      Ambulation/Gait   Ambulation Distance (Feet) 30 Feet    Assistive device Straight cane    Gait velocity .63 m/s    Gait Comments R reduced step length and height, reduced pelvic rotation      Balance   Balance Assessed --   Progressive balance test: failed at semi tandem stance, R LE in rear at 5''                     Objective measurements completed on examination: See above findings.               PT Education - 07/12/20 1515    Education Details POC, prognosis, diagnosis, HEP    Person(s) Educated Patient    Methods Explanation;Demonstration    Comprehension Verbalized understanding;Returned demonstration            PT Short Term Goals - 07/12/20 1540      PT SHORT TERM GOAL #1   Title [name] will be >75% HEP compliant within 3 weeks to facilitate carryover between sessions and move towards eventual independent management of their condition.    Status New    Target Date 08/02/20             PT Long Term Goals - 07/12/20 1541      PT LONG TERM GOAL #1   Title [name] will improve 30'' STS (MCID 2) to >/= 10x to show improved LE strength and improved transfers.    Baseline 7x    Target Date 09/06/20      PT LONG TERM GOAL #2   Title [name] will improve gait speed to .9 m/s (.1 m/s MCID) to show functional improvement in ambulation    Baseline .63 over 30'    Target Date 09/06/20      PT LONG TERM GOAL #3   Title DAZARIA MACNEILL will be able to stand for >30''' in semi-tandem stance, to show a significant improvement in balance in order to reduce fall risk    Baseline 5'' semi-tandem    Target Date  09/06/20                  Plan - 07/12/20 1542    Clinical Impression Statement SHERRYN POLLINO is a 87 y.o. female who presents to clinic with signs and sxs consistent with low back pain with concurrent gait pathology.  Low back pain is mechanical in nature.  Consistent with core weakness and lack of endurance exacerbated by forward flexed posture.  Significant LE weakness contributing to gait pathology.  Pt presents with pain and notable deficits in: core strength, LE strength R>L, gait, and balance.  Pt is limited functionally in lifting, community ambulation, steps, and ADLs.  Pt will benefit from skilled therapy to address pain and the listed deficits in order to achieve functional goals, enable safety and independence in completion of daily tasks, and return to PLOF.    Personal Factors and Comorbidities Age;Fitness    Examination-Activity Limitations Carry;Lift;Bend;Squat;Stairs    Examination-Participation Restrictions Community Activity;Laundry;Shop;Yard Work;Cleaning  Stability/Clinical Decision Making Stable/Uncomplicated    Clinical Decision Making Low    Rehab Potential Good    PT Frequency 2x / week    PT Duration 8 weeks    PT Treatment/Interventions ADLs/Self Care Home Management;Aquatic Therapy;Iontophoresis 4mg /ml Dexamethasone;Moist Heat;Gait training;Therapeutic activities;Therapeutic exercise;Neuromuscular re-education;Manual techniques;Dry needling;Vasopneumatic Device    PT Next Visit Plan Review HEP, gentle lumbar mobility and strengthening, LE strengthening R > L    PT Home Exercise Plan [id number]    Consulted and Agree with Plan of Care Patient           Patient will benefit from skilled therapeutic intervention in order to improve the following deficits and impairments:  Abnormal gait,Decreased activity tolerance,Decreased endurance,Decreased range of motion,Decreased strength,Hypomobility,Pain,Decreased balance  Visit Diagnosis: Chronic low back pain  without sciatica, unspecified back pain laterality     Problem List Patient Active Problem List   Diagnosis Date Noted  . Lumbar back pain 06/16/2020  . Assistance needed with transportation 06/16/2020  . Healthcare maintenance 06/16/2020  . Encounter to establish care 02/03/2020  . LUQ pain 02/03/2020  . Left hemiparesis (HCC) 09/17/2019  . CKD (chronic kidney disease) stage 3, GFR 30-59 ml/min (HCC) 07/21/2018  . Stable angina (HCC) 01/21/2018  . Bradycardia 10/23/2017  . Chest pain 09/29/2017  . Acute lower GI bleeding 03/06/2017  . HTN (hypertension) 03/06/2017  . Acute blood loss anemia 03/06/2017  . Hiatal hernia 11/28/2016  . Chest pain, rule out acute myocardial infarction 11/27/2016  . Aortic stenosis 05/24/2015  . GERD without esophagitis 05/23/2015  . Osteoarthritis of left hip 05/17/2015  . 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 Diverticulosis 02/23/2012    Alphonzo SeveranceKarl Kiriana Worthington PT, DPT 07/12/20 3:49 PM  [facility] [address] [place], [place], [address] Phone: [phone]   Fax:  [phone]  Name: Wilder GladeBertha C Trochez MRN: [id number] Date of Birth: 06/11/1923

## 2020-07-12 NOTE — Patient Instructions (Signed)
Access Code: [email or web address] URL: [email or web address] Date: 07/12/2020 Prepared by: Alphonzo Severance  Exercises Hooklying Clamshell with Resistance - 1 x daily - 7 x weekly - 3 sets - 10 reps Supine Hip Adduction Isometric with Ball - 1 x daily - 7 x weekly - 2 sets - 10 reps - 10'' hold Supine Posterior Pelvic Tilt - 2 x daily - 7 x weekly - 2 sets - 10 reps - 5'' hold

## 2020-07-18 ENCOUNTER — Other Ambulatory Visit: Payer: Self-pay | Admitting: *Deleted

## 2020-07-18 MED ORDER — LOSARTAN POTASSIUM 25 MG PO TABS: 25.0000 mg | ORAL_TABLET | Freq: Every day | ORAL | 0 refills | Status: DC

## 2020-07-19 ENCOUNTER — Encounter: Payer: Self-pay | Admitting: Physical Therapy

## 2020-07-19 ENCOUNTER — Ambulatory Visit: Payer: 59 | Attending: Family Medicine | Admitting: Physical Therapy

## 2020-07-19 ENCOUNTER — Other Ambulatory Visit: Payer: Self-pay

## 2020-07-19 DIAGNOSIS — M6281 Muscle weakness (generalized): Secondary | ICD-10-CM | POA: Diagnosis present

## 2020-07-19 DIAGNOSIS — M545 Low back pain, unspecified: Secondary | ICD-10-CM

## 2020-07-19 DIAGNOSIS — R2689 Other abnormalities of gait and mobility: Secondary | ICD-10-CM | POA: Insufficient documentation

## 2020-07-19 DIAGNOSIS — G8929 Other chronic pain: Secondary | ICD-10-CM | POA: Insufficient documentation

## 2020-07-19 NOTE — Therapy (Signed)
[facility] Outpatient Rehabilitation [facility] [address] [place], [place], [address] Phone: [phone]   Fax:  [phone]  Physical Therapy Treatment  Patient Details  Name: [name] MRN: [id number] Date of Birth: 11/05/1923 Referring Provider (PT): Moses Manners, MD   Encounter Date: 07/19/2020   PT End of Session - 07/19/20 1749    Visit Number 2    Number of Visits 16    Date for PT Re-Evaluation 09/06/20    Authorization Type MCR    Progress Note Due on Visit 10    PT Start Time 1745    PT Stop Time 1828    PT Time Calculation (min) 43 min    Equipment Utilized During Treatment Gait belt    Activity Tolerance Patient tolerated treatment well    Behavior During Therapy [facility] for tasks assessed/performed           Past Medical History:  Diagnosis Date  . Acid reflux    takes Nexium daily  . Anemia    takes Ferrous Sulfate daily  . Aortic stenosis    mild AS pk grad 18, mean grad 9, AVA 1.32 cm 03/2015 echo (Dr. Jacinto Halim)  . Arthritis   . Chronic kidney disease (CKD), stage III (moderate) (HCC)   . Diverticulitis   . Hematochezia 03/2015  . High cholesterol    can't take the meds  . History of blood transfusion 03/2015   no abnormal reaction noted  . History of GI diverticular bleed   . HTN (hypertension)    takes Losartan-HCTZ and Metoprolol daily  . Joint pain   . Joint swelling   . Nocturia   . Numbness    in legs  . Peripheral edema    occasionally  . Shortness of breath dyspnea    occasionally and with exertion  . Slow urinary stream    at times    Past Surgical History:  Procedure Laterality Date  . ABCESS DRAINAGE     abdominal abcess  . cataract surgery Bilateral   . CHOLECYSTECTOMY    . DILATION AND CURETTAGE OF UTERUS    . ESOPHAGOGASTRODUODENOSCOPY N/A 02/23/2015   Procedure: ESOPHAGOGASTRODUODENOSCOPY (EGD);  Surgeon: Carman Ching, MD;  Location: [facility] ENDOSCOPY;  Service: Endoscopy;  Laterality: N/A;  . FLEXIBLE  SIGMOIDOSCOPY N/A 02/24/2015   Procedure: FLEXIBLE SIGMOIDOSCOPY;  Surgeon: Carman Ching, MD;  Location: [facility] ENDOSCOPY;  Service: Endoscopy;  Laterality: N/A;  . IR ANGIOGRAM SELECTIVE EACH ADDITIONAL VESSEL  03/06/2017  . IR ANGIOGRAM SELECTIVE EACH ADDITIONAL VESSEL  03/06/2017  . IR ANGIOGRAM SELECTIVE EACH ADDITIONAL VESSEL  03/06/2017  . IR ANGIOGRAM SELECTIVE EACH ADDITIONAL VESSEL  03/08/2017  . IR ANGIOGRAM VISCERAL SELECTIVE  03/06/2017  . IR ANGIOGRAM VISCERAL SELECTIVE  03/06/2017  . IR ANGIOGRAM VISCERAL SELECTIVE  03/06/2017  . IR ANGIOGRAM VISCERAL SELECTIVE  03/08/2017  . IR ANGIOGRAM VISCERAL SELECTIVE  03/08/2017  . IR EMBO ART  VEN HEMORR LYMPH EXTRAV  INC GUIDE ROADMAPPING  03/06/2017  . IR EMBO ART  VEN HEMORR LYMPH EXTRAV  INC GUIDE ROADMAPPING  03/08/2017  . IR US GUIDE VASC ACCESS RIGHT  03/06/2017  . IR US GUIDE VASC ACCESS RIGHT  03/08/2017  . left knee surgery    . TOTAL HIP ARTHROPLASTY Left 05/17/2015   Procedure: LEFT TOTAL HIP ARTHROPLASTY ANTERIOR APPROACH;  Surgeon: Kathryne Hitch, MD;  Location: [facility] OR;  Service: Orthopedics;  Laterality: Left;    There were no vitals filed for this visit.  Subjective Assessment - 07/19/20 1750    Subjective Pt reports that her back is still giving her problems.  She is worried she will not be able to make her appt for financial reasons.    Pertinent History HTN, FALL RISK, L THA, CKD    Limitations Lifting;Standing;Walking    How long can you stand comfortably? 5 min    How long can you walk comfortably? 1-2 min d/t weakness    Patient Stated Goals Get low back stronger and less painful    Pain Onset More than a month ago                             [facility] Adult PT Treatment/Exercise - 07/19/20 0001      Exercises   Exercises Lumbar;Knee/Hip;Ankle      Lumbar Exercises: Stretches   Single Knee to Chest Stretch Limitations manual    Lower Trunk Rotation Limitations 20x    Pelvic Tilt Limitations 5''  2x3    Piriformis Stretch Limitations manual      Lumbar Exercises: Aerobic   Nustep 5 min L4      Lumbar Exercises: Seated   Sit to Stand Limitations 2x5 with UE support      Lumbar Exercises: Supine   Clam Limitations 2x10 alternating RTB    Bridge Limitations 2x10 no lift off    Straight Leg Raises Limitations 2x10 ea from foam roller      Knee/Hip Exercises: Supine   Hip Adduction Isometric Limitations 10''x15                    PT Short Term Goals - 07/12/20 1540      PT SHORT TERM GOAL #1   Title [name] will be >75% HEP compliant within 3 weeks to facilitate carryover between sessions and move towards eventual independent management of their condition.    Status New    Target Date 08/02/20             PT Long Term Goals - 07/12/20 1541      PT LONG TERM GOAL #1   Title [name] will improve 30'' STS (MCID 2) to >/= 10x to show improved LE strength and improved transfers.    Baseline 7x    Target Date 09/06/20      PT LONG TERM GOAL #2   Title [name] will improve gait speed to .9 m/s (.1 m/s MCID) to show functional improvement in ambulation    Baseline .63 over 30'    Target Date 09/06/20      PT LONG TERM GOAL #3   Title CLAUDETTA SALLIE will be able to stand for >30''' in semi-tandem stance, to show a significant improvement in balance in order to reduce fall risk    Baseline 5'' semi-tandem    Target Date 09/06/20                 Plan - 07/19/20 1809    Clinical Impression Statement Pt tolerated session well.  high level of fatigue with mat exercises.  She is limited in R knee flexion making bridge more difficult, but she was able to complete with support of R foot.  We will continue to progress core and LE as able.    Personal Factors and Comorbidities Age;Fitness    Examination-Activity Limitations Carry;Lift;Bend;Squat;Stairs    Examination-Participation Restrictions Community Activity;Laundry;Shop;Yard  Work;Cleaning    Stability/Clinical  Decision Making Stable/Uncomplicated    Rehab Potential Good    PT Frequency 2x / week    PT Duration 8 weeks    PT Treatment/Interventions ADLs/Self Care Home Management;Aquatic Therapy;Iontophoresis 4mg /ml Dexamethasone;Moist Heat;Gait training;Therapeutic activities;Therapeutic exercise;Neuromuscular re-education;Manual techniques;Dry needling;Vasopneumatic Device    PT Next Visit Plan Review HEP, gentle lumbar mobility and strengthening, LE strengthening R > L    PT Home Exercise Plan    Consulted and Agree with Plan of Care Patient           Patient will benefit from skilled therapeutic intervention in order to improve the following deficits and impairments:  Abnormal gait,Decreased activity tolerance,Decreased endurance,Decreased range of motion,Decreased strength,Hypomobility,Pain,Decreased balance  Visit Diagnosis: Chronic low back pain without sciatica, unspecified back pain laterality  Muscle weakness  Balance problem  Other abnormalities of gait and mobility     Problem List Patient Active Problem List   Diagnosis Date Noted  . Lumbar back pain 06/16/2020  . Assistance needed with transportation 06/16/2020  . Healthcare maintenance 06/16/2020  . Encounter to establish care 02/03/2020  . LUQ pain 02/03/2020  . Left hemiparesis (HCC) 09/17/2019  . CKD (chronic kidney disease) stage 3, GFR 30-59 ml/min (HCC) 07/21/2018  . Stable angina (HCC) 01/21/2018  . Bradycardia 10/23/2017  . Chest pain 09/29/2017  . Acute lower GI bleeding 03/06/2017  . HTN (hypertension) 03/06/2017  . Acute blood loss anemia 03/06/2017  . Hiatal hernia 11/28/2016  . Chest pain, rule out acute myocardial infarction 11/27/2016  . Aortic stenosis 05/24/2015  . GERD without esophagitis 05/23/2015  . Osteoarthritis of left hip 05/17/2015  . 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 Diverticulosis 02/23/2012    04/22/2012  PT, DPT 07/19/20 6:30 PM  [facility] Outpatient Rehabilitation [facility] [address] [place], [place], [place] Phone: [phone]   Fax:  [phone]  Name: KRISS PERLEBERG MRN: [name] Date of Birth: 10/15/1923

## 2020-07-21 ENCOUNTER — Ambulatory Visit: Payer: 59 | Admitting: Physical Therapy

## 2020-07-25 NOTE — Progress Notes (Signed)
Cardiology Office Note:    Date:  07/26/2020   ID:  [name], DOB [birth year], MRN [id number]  PCP:  Richarda Osmond, DO   [place] Providers Cardiologist:  Mertie Moores, MD Cardiology APP:  Sharmon [place]     Referring MD: Richarda Osmond, DO   Chief Complaint:  Follow-up (Angina, shortness of breath)    Patient Profile:    [name] is a 87 y.o. female with:  Coronary artery disease Non-obstructive by cath in 2010 admx 09/2017 w/ CP >> med Rx due to age, CKD Aortic stenosis Mild -Echo 09/2017: Mean gradient 11 mmHg Chronic kidney disease 3 Hypertension Prior GI bleeding  LBBB   Prior CV studies: LONG TERM MONITOR (8-14 DAYS) INTERPRETATION 11/12/2019 Narrative  Sinus rhythm including NSR and sinus bradycardia  Slowest HR recorded was 34 at 2:48 PMnon Sept. 21, 2021  She had a 4 beat run of slow ventricular tachycardia ( HR of 104 ) which was not symptomatic   Carotid US 09/18/2019 Bilateral ICA 1-39   Echocardiogram 09/18/2019 EF 60-65, normal wall motion, mild LVH, GR 1 DD normal RVSF, RVSP 52.6 (moderately elevated), moderate LAE, mild RAE, mild MR, mild aortic stenosis (mean gradient 13.2 mmHg)   Cardiac monitor 08/2018 Sinus rhythm, sinus bradycardia, rare PVCs   Echocardiogram 09/30/2017 EF 55-60, normal wall motion, grade 2 diastolic dysfunction, mild aortic stenosis (mean 11), MAC, moderate LAE, mild RAE, mild TR, PASP 33   Cardiac catheterization 01/08/2009 EF 55-60 LAD proximal 15-20, mid 30-40; D2 ostial 20 LCx proximal 20-25, distal 40-50 RCA proximal 20-25, mid 50-55, distal 20-25    History of Present Illness: [name] was last seen in 4/22.  She had symptoms of dyspnea on exertion with assoc chest pain and palpitations.  Conservative management has been recommended in the past due to her advanced age and chronic kidney disease.  I increased her beta-blocker. She returns for f/u.  She is here alone.  She continues to  have episodes of chest discomfort relieved by nitroglycerin.  She also notes shortness of breath with minimal activity.  She has a lot of back problems and walks with a cane.  She sees physical therapy later today.  She sleeps on an incline.  She has lower extremity swelling that improves with elevation.  She has not had syncope.  She does note elevated heart rates when she has chest pain.  She wore a monitor last year without significant arrhythmias.        Past Medical History:  Diagnosis Date   Acid reflux    takes Nexium daily   Anemia    takes Ferrous Sulfate daily   Aortic stenosis    mild AS pk grad 18, mean grad 9, AVA 1.32 cm 03/2015 echo (Dr. Einar Gip)   Arthritis    Chronic kidney disease (CKD), stage III (moderate) (HCC)    Diverticulitis    Hematochezia 03/2015   High cholesterol    can't take the meds   History of blood transfusion 03/2015   no abnormal reaction noted   History of GI diverticular bleed    HTN (hypertension)    takes Losartan-HCTZ and Metoprolol daily   Joint pain    Joint swelling    Nocturia    Numbness    in legs   Peripheral edema    occasionally   Shortness of breath dyspnea    occasionally and with exertion   Slow urinary stream    at  times    Current Medications: Current Meds  Medication Sig   acetaminophen (TYLENOL) 650 MG CR tablet Take 650 mg by mouth daily.   amLODipine (NORVASC) 5 MG tablet Take 1 tablet (5 mg total) by mouth daily.   Cholecalciferol (VITAMIN D3) 2000 units TABS Take 2,000 Units by mouth daily.   diclofenac sodium (VOLTAREN) 1 % GEL Apply 2 g topically 2 (two) times daily as needed (pain).    famotidine (PEPCID) 20 MG tablet Take 20 mg by mouth daily.    Ferrous Sulfate 27 MG TABS Take 1 tablet by mouth daily.   isosorbide mononitrate (IMDUR) 120 MG 24 hr tablet Take 1 tablet (120 mg total) by mouth daily.   isosorbide mononitrate (IMDUR) 120 MG 24 hr tablet One tablet by mouth ( 120 mg ) am half tablet by mouth (60 mg)  pm.   losartan (COZAAR) 25 MG tablet Take 1 tablet (25 mg total) by mouth daily.   metoprolol tartrate (LOPRESSOR) 25 MG tablet Take 1 tablet (25 mg total) by mouth 2 (two) times daily.   Multiple Vitamin (MULITIVITAMIN WITH MINERALS) TABS Take 1 tablet by mouth daily.   nitroGLYCERIN (NITROSTAT) 0.4 MG SL tablet Place 0.4 mg under the tongue every 5 (five) minutes as needed for chest pain.    polyvinyl alcohol (LIQUIFILM TEARS) 1.4 % ophthalmic solution Place 1 drop into both eyes as needed for dry eyes.   PROAIR HFA 108 (90 Base) MCG/ACT inhaler Inhale 1-2 puffs into the lungs every 4 (four) hours as needed for shortness of breath or wheezing.   simethicone (GAS-X) 80 MG chewable tablet Chew 1 tablet (80 mg total) by mouth every 6 (six) hours as needed for flatulence.   [DISCONTINUED] furosemide (LASIX) 20 MG tablet Take 1 tablet (20 mg total) by mouth as needed for edema.     Allergies:   Ezetimibe, Lipitor [atorvastatin], Pravachol [pravastatin], Statins, Zocor [simvastatin], and Cholestyramine   Social History   Tobacco Use   Smoking status: Former    Pack years: 0.00   Smokeless tobacco: Never   Tobacco comments:    quit smoking 8+ yrs ago  Vaping Use   Vaping Use: Never used  Substance Use Topics   Alcohol use: No    Comment: quit yrs ago   Drug use: No     Family Hx: The patient's family history includes Cancer in an other family member; Diabetes in an other family member; Heart disease in an other family member; Hypertension in an other family member.  ROS   EKGs/Labs/Other Test Reviewed:    EKG:  EKG is not ordered today.  The ekg ordered today demonstrates n/a  Recent Labs: 09/17/2019: ALT 11 09/21/2019: Hemoglobin 9.9; Platelets 217 10/14/2019: NT-Pro BNP 521 06/14/2020: BUN 19; Creatinine, Ser 1.35; Potassium 4.9; Sodium 141   Recent Lipid Panel   Risk Assessment/Calculations:      Physical Exam:    VS:  BP (!) 160/70   Pulse 70   Ht _0  (1.6 m)   Wt 200  lb (90.7 kg)   SpO2 96%   BMI 35.43 kg/m     Wt Readings from Last 3 Encounters:  07/26/20 200 lb (90.7 kg)  06/14/20 204 lb (92.5 kg)  06/08/20 203 lb 6.4 oz (92.3 kg)     Constitutional:      Appearance: Healthy appearance. Not in distress.  Neck:     Vascular: No JVR.  Pulmonary:     Effort: Pulmonary effort is normal.  Breath sounds: No wheezing. No rales.  Cardiovascular:     Normal rate. Regular rhythm. Normal S1. Normal S2.      Murmurs: There is a grade 2/6 harsh systolic murmur at the URSB.  Edema:    Peripheral edema absent.  Abdominal:     Palpations: Abdomen is soft.  Musculoskeletal:     Cervical back: Neck supple. Skin:    General: Skin is warm and dry.  Neurological:     Mental Status: Alert and oriented to person, place and time.     Cranial Nerves: Cranial nerves are intact.        ASSESSMENT & PLAN:    1. Coronary artery disease of native artery of native heart with stable angina pectoris [facility]) She has a history of nonobstructive disease by cardiac catheterization in 2010.  She has been managed medically for angina since 2019.  Cardiac catheterization is being avoided due to advanced age and chronic kidney disease.  She continues to have frequent episodes of chest discomfort relieved by nitroglycerin.  She also notes palpitations associated with this as well as shortness of breath.  She has lower extremity swelling.  Question if some of her symptoms may be related to volume excess.  I will adjust her Lasix as outlined below.  Continue current dose of metoprolol tartrate, amlodipine.  I will increase her isosorbide mononitrate to 120 mg in the morning and 60 mg in the evening.  Follow-up with Dr. Acie Fredrickson or me in 3 months  2. Shortness of breath Echocardiogram in 8/21 demonstrated mild diastolic dysfunction and normal ejection fraction.  She does have some evidence of volume excess.  I had previously given her furosemide to take as needed.  I have asked her  to increase her furosemide to 20 mg once daily.  Obtain follow-up BMET, BNP in 1 week.  3. Stage 3a chronic kidney disease ([place]) I will keep a close eye on her renal function and potassium with a follow-up BMET in 1 week after increasing furosemide to once daily.  4. Nonrheumatic aortic valve stenosis Mild aortic stenosis by echocardiogram in 8/21.  5. Essential hypertension Pressure above target.  Adjust isosorbide and furosemide as noted.  I will try to provide her with a blood pressure cuff to monitor blood pressures at home.  6. Palpitations She had a monitor done in 9/21 that did not show significant arrhythmias.  She does note that she only wore this for a few days as it was irritating her skin.  Continue current dose of metoprolol tartrate.  7. Normocytic anemia Hemoglobin in 8/21 was 9.9.  Worsening anemia can contribute to her shortness of breath.  Therefore, I will obtain a CBC next week with her follow-up labs.           Dispo:  Return in about 3 months (around 10/26/2020) for Routine follow up in 3 months with Dr. Acie Fredrickson. .   Medication Adjustments/Labs and Tests Ordered: Current medicines are reviewed at length with the patient today.  Concerns regarding medicines are outlined above.  Tests Ordered: Orders Placed This Encounter  Procedures   Basic Metabolic Panel (BMET)   CBC   Pro b natriuretic peptide   Medication Changes: Meds ordered this encounter  Medications   furosemide (LASIX) 20 MG tablet    Sig: Take 1 tablet (20 mg total) by mouth daily.    Dispense:  30 tablet    Refill:  1   isosorbide mononitrate (IMDUR) 120 MG 24 hr tablet  Sig: One tablet by mouth ( 120 mg ) am half tablet by mouth (60 mg) pm.    Dispense:  90 tablet    Refill:  3    Signed, Richardson Dopp, PA-C  07/26/2020 1:16 PM    [facility] [place], [place], [place]  [address] Phone: [phone]; Fax: [phone]

## 2020-07-26 ENCOUNTER — Other Ambulatory Visit: Payer: Self-pay

## 2020-07-26 ENCOUNTER — Ambulatory Visit: Payer: 59 | Admitting: Physical Therapy

## 2020-07-26 ENCOUNTER — Encounter: Payer: Self-pay | Admitting: Physical Therapy

## 2020-07-26 ENCOUNTER — Ambulatory Visit (INDEPENDENT_AMBULATORY_CARE_PROVIDER_SITE_OTHER): Payer: 59 | Admitting: Physician Assistant

## 2020-07-26 ENCOUNTER — Encounter: Payer: Self-pay | Admitting: Physician Assistant

## 2020-07-26 VITALS — BP 160/70 | HR 70 | Ht 63.0 in | Wt 200.0 lb

## 2020-07-26 DIAGNOSIS — R002 Palpitations: Secondary | ICD-10-CM

## 2020-07-26 DIAGNOSIS — M6281 Muscle weakness (generalized): Secondary | ICD-10-CM

## 2020-07-26 DIAGNOSIS — I10 Essential (primary) hypertension: Secondary | ICD-10-CM

## 2020-07-26 DIAGNOSIS — R0602 Shortness of breath: Secondary | ICD-10-CM | POA: Diagnosis not present

## 2020-07-26 DIAGNOSIS — N1831 Chronic kidney disease, stage 3a: Secondary | ICD-10-CM | POA: Diagnosis not present

## 2020-07-26 DIAGNOSIS — M545 Low back pain, unspecified: Secondary | ICD-10-CM | POA: Diagnosis not present

## 2020-07-26 DIAGNOSIS — I25118 Atherosclerotic heart disease of native coronary artery with other forms of angina pectoris: Secondary | ICD-10-CM

## 2020-07-26 DIAGNOSIS — D649 Anemia, unspecified: Secondary | ICD-10-CM

## 2020-07-26 DIAGNOSIS — G8929 Other chronic pain: Secondary | ICD-10-CM

## 2020-07-26 DIAGNOSIS — I350 Nonrheumatic aortic (valve) stenosis: Secondary | ICD-10-CM

## 2020-07-26 DIAGNOSIS — R2689 Other abnormalities of gait and mobility: Secondary | ICD-10-CM

## 2020-07-26 MED ORDER — ISOSORBIDE MONONITRATE ER 120 MG PO TB24: ORAL_TABLET | ORAL | 3 refills | Status: DC

## 2020-07-26 MED ORDER — FUROSEMIDE 20 MG PO TABS
20.0000 mg | ORAL_TABLET | Freq: Every day | ORAL | 1 refills | Status: DC
Start: 2020-07-26 — End: 2020-09-26

## 2020-07-26 NOTE — Patient Instructions (Signed)
Medication Instructions:  INCREASE Lasix one tablet by mouth ( 20 mg ) daily.  INCREASE Imdur one tablet by mouth ( 120 mg)  am one half tablet by mouth ( 60 mg) pm.   *If you need a refill on your cardiac medications before your next appointment, please call your pharmacy*   Lab Work: Your physician recommends that you return for lab work on Tuesday, June 21 before your therapy session.   If you have labs (blood work) drawn today and your tests are completely normal, you will receive your results only by: MyChart Message (if you have MyChart) OR A paper copy in the mail If you have any lab test that is abnormal or we need to change your treatment, we will call you to review the results.   Testing/Procedures: -None   Follow-Up: At [facility], you and your health needs are our priority.  As part of our continuing mission to provide you with exceptional heart care, we have created designated Provider Care Teams.  These Care Teams include your primary Cardiologist (physician) and Advanced Practice Providers (APPs -  Physician Assistants and Nurse Practitioners) who all work together to provide you with the care you need, when you need it.  We recommend signing up for the patient portal called "MyChart".  Sign up information is provided on this After Visit Summary.  MyChart is used to connect with patients for Virtual Visits (Telemedicine).  Patients are able to view lab/test results, encounter notes, upcoming appointments, etc.  Non-urgent messages can be sent to your provider as well.   To learn more about what you can do with MyChart, go to [email or web address].    Your next appointment:   3 month(s) with Dr. Elease Hashimoto on   The format for your next appointment:   In Person  Provider:   Kristeen Miss, MD   Other Instructions Patient was given a BP cuff today.

## 2020-07-27 NOTE — Therapy (Addendum)
[place], [place], [address] Phone: [phone]   Fax:  [phone]  Physical Therapy Treatment  Patient Details  Name: [name] MRN: [id number] Date of Birth: Jul 26, 1923 Referring Provider (PT): Zenia Resides, MD   Encounter Date: 07/26/2020     Past Medical History:  Diagnosis Date   Acid reflux    takes Nexium daily   Anemia    takes Ferrous Sulfate daily   Aortic stenosis    mild AS pk grad 18, mean grad 9, AVA 1.32 cm 03/2015 echo (Dr. Einar Gip)   Arthritis    Chronic kidney disease (CKD), stage III (moderate) ([place])    Diverticulitis    Hematochezia 03/2015   High cholesterol    can't take the meds   History of blood transfusion 03/2015   no abnormal reaction noted   History of GI diverticular bleed    HTN (hypertension)    takes Losartan-HCTZ and Metoprolol daily   Joint pain    Joint swelling    Nocturia    Numbness    in legs   Peripheral edema    occasionally   Shortness of breath dyspnea    occasionally and with exertion   Slow urinary stream    at times    Past Surgical History:  Procedure Laterality Date   ABCESS DRAINAGE     abdominal abcess   cataract surgery Bilateral    CHOLECYSTECTOMY     DILATION AND CURETTAGE OF UTERUS     ESOPHAGOGASTRODUODENOSCOPY N/A 02/23/2015   Procedure: ESOPHAGOGASTRODUODENOSCOPY (EGD);  Surgeon: Laurence Spates, MD;  [facility]COPY;  Service: Endoscopy;  Laterality: N/A;   FLEXIBLE SIGMOIDOSCOPY N/A 02/24/2015   Procedure: FLEXIBLE SIGMOIDOSCOPY;  Surgeon: Laurence Spates, MD;  Location: [facility];  Service: Endoscopy;  Laterality: N/A;   IR ANGIOGRAM SELECTIVE EACH ADDITIONAL VESSEL  03/06/2017   IR ANGIOGRAM SELECTIVE EACH ADDITIONAL VESSEL  03/06/2017   IR ANGIOGRAM SELECTIVE EACH ADDITIONAL VESSEL  03/06/2017   IR ANGIOGRAM SELECTIVE EACH ADDITIONAL VESSEL  03/08/2017   IR ANGIOGRAM VISCERAL SELECTIVE  03/06/2017   IR ANGIOGRAM  VISCERAL SELECTIVE  03/06/2017   IR ANGIOGRAM VISCERAL SELECTIVE  03/06/2017   IR ANGIOGRAM VISCERAL SELECTIVE  03/08/2017   IR ANGIOGRAM VISCERAL SELECTIVE  03/08/2017   IR EMBO ART  VEN HEMORR LYMPH EXTRAV  INC GUIDE ROADMAPPING  03/06/2017   IR EMBO ART  VEN HEMORR LYMPH EXTRAV  INC GUIDE ROADMAPPING  03/08/2017   IR US GUIDE [place] RIGHT  03/06/2017   IR US GUIDE VASC ACCESS RIGHT  03/08/2017   left knee surgery     TOTAL HIP ARTHROPLASTY Left 05/17/2015   Procedure: LEFT TOTAL HIP ARTHROPLASTY ANTERIOR APPROACH;  Surgeon: Mcarthur Rossetti, MD;  Location: [facility];  Service: Orthopedics;  Laterality: Left;    There were no vitals filed for this visit.                                  PT Short Term Goals - 08/11/20 1537       PT SHORT TERM GOAL #1   Title [name] will be >75% HEP compliant within 3 weeks to facilitate carryover between sessions and move towards eventual independent management of their condition.    Baseline 6/30: MET    Status Achieved    Target Date 08/02/20  PT Long Term Goals - 08/11/20 1537       PT LONG TERM GOAL #1   Title [name] will improve 30'' STS (MCID 2) to >/= 10x to show improved LE strength and improved transfers.    Baseline 7x 6/30: 8x      PT LONG TERM GOAL #2   Title [name] will improve gait speed to .9 m/s (.1 m/s MCID) to show functional improvement in ambulation    Baseline .63 over 30' 6/30: .44 over 10 m      PT LONG TERM GOAL #3   Title [name] will be able to stand for >30''' in semi-tandem stance, to show a significant improvement in balance in order to reduce fall risk    Baseline 5'' semi-tandem 6/30:                     Patient will benefit from skilled therapeutic intervention in order to improve the following deficits and impairments:  Abnormal gait, Decreased activity tolerance, Decreased endurance, Decreased range of motion,  Decreased strength, Hypomobility, Pain, Decreased balance  Visit Diagnosis: Chronic low back pain without sciatica, unspecified back pain laterality  Muscle weakness  Other abnormalities of gait and mobility  Balance problem     Problem List Patient Active Problem List   Diagnosis Date Noted   Lumbar back pain 06/16/2020   Assistance needed with transportation 06/16/2020   Healthcare maintenance 06/16/2020   Encounter to establish care 02/03/2020   LUQ pain 02/03/2020   Left hemiparesis ([place]) 09/17/2019   CKD (chronic kidney disease) stage 3, GFR 30-59 ml/min (HCC) 07/21/2018   Stable angina ([place]) 01/21/2018   Bradycardia 10/23/2017   Chest pain 09/29/2017   Acute lower GI bleeding 03/06/2017   HTN (hypertension) 03/06/2017   Acute blood loss anemia 03/06/2017   Hiatal hernia 11/28/2016   Chest pain, rule out acute myocardial infarction 11/27/2016   Aortic stenosis 05/24/2015   GERD without esophagitis 05/23/2015   Osteoarthritis of left hip 05/17/2015   Status post total replacement of left hip 05/17/2015   Diverticulosis 02/23/2012    Shearon Balo PT, DPT 09/06/20 9:25 AM   [facility] [address] [place], [place], [address] Phone: [phone]   Fax:  [phone]  Name: [name] MRN: [id number] Date of Birth: Jul 21, 1923

## 2020-07-28 ENCOUNTER — Other Ambulatory Visit: Payer: Self-pay

## 2020-07-28 ENCOUNTER — Ambulatory Visit: Payer: 59 | Admitting: Physical Therapy

## 2020-07-28 ENCOUNTER — Encounter: Payer: Self-pay | Admitting: Physical Therapy

## 2020-07-28 DIAGNOSIS — M545 Low back pain, unspecified: Secondary | ICD-10-CM

## 2020-07-28 DIAGNOSIS — R2689 Other abnormalities of gait and mobility: Secondary | ICD-10-CM

## 2020-07-28 DIAGNOSIS — M6281 Muscle weakness (generalized): Secondary | ICD-10-CM

## 2020-07-28 DIAGNOSIS — G8929 Other chronic pain: Secondary | ICD-10-CM

## 2020-07-28 NOTE — Therapy (Signed)
[name] [name] [address] [name], [name], [address] Phone: [phone]   Fax:  [phone]  Physical Therapy Treatment  Patient Details  Name: [name] MRN: [id number] Date of Birth: [birth year] Referring Provider (PT): Brenda Manners, MD   Encounter Date: 07/28/2020   PT End of Session - 07/28/20 1515     Visit Number 4    Number of Visits 16    Date for PT Re-Evaluation 09/06/20    Authorization Type MCR    Progress Note Due on Visit 10    PT Start Time 1515    PT Stop Time 1558    PT Time Calculation (min) 43 min    Equipment Utilized During Treatment Gait belt    Activity Tolerance Patient tolerated treatment well    Behavior During Therapy WFL for tasks assessed/performed             Past Medical History:  Diagnosis Date   Acid reflux    takes Nexium daily   Anemia    takes Ferrous Sulfate daily   Aortic stenosis    mild AS pk grad 18, mean grad 9, AVA 1.32 cm 03/2015 echo (Dr. Jacinto [name])   Arthritis    Chronic kidney disease (CKD), stage III (moderate) (HCC)    Diverticulitis    Hematochezia 03/2015   High cholesterol    can't take the meds   History of blood transfusion 03/2015   no abnormal reaction noted   History of GI diverticular bleed    HTN (hypertension)    takes Losartan-HCTZ and Metoprolol daily   Joint pain    Joint swelling    Nocturia    Numbness    in legs   Peripheral edema    occasionally   Shortness of breath dyspnea    occasionally and with exertion   Slow urinary stream    at times    Past Surgical History:  Procedure Laterality Date   ABCESS DRAINAGE     abdominal abcess   cataract surgery Bilateral    CHOLECYSTECTOMY     DILATION AND CURETTAGE OF UTERUS     ESOPHAGOGASTRODUODENOSCOPY N/A 02/23/2015   Procedure: ESOPHAGOGASTRODUODENOSCOPY (EGD);  Surgeon: Brenda Ching, MD;  Location: [facility] ENDOSCOPY;  Service: Endoscopy;  Laterality: N/A;   FLEXIBLE SIGMOIDOSCOPY N/A  02/24/2015   Procedure: FLEXIBLE SIGMOIDOSCOPY;  Surgeon: Brenda Ching, MD;  Location: [name] ENDOSCOPY;  Service: Endoscopy;  Laterality: N/A;   IR ANGIOGRAM SELECTIVE EACH ADDITIONAL VESSEL  03/06/2017   IR ANGIOGRAM SELECTIVE EACH ADDITIONAL VESSEL  03/06/2017   IR ANGIOGRAM SELECTIVE EACH ADDITIONAL VESSEL  03/06/2017   IR ANGIOGRAM SELECTIVE EACH ADDITIONAL VESSEL  03/08/2017   IR ANGIOGRAM VISCERAL SELECTIVE  03/06/2017   IR ANGIOGRAM VISCERAL SELECTIVE  03/06/2017   IR ANGIOGRAM VISCERAL SELECTIVE  03/06/2017   IR ANGIOGRAM VISCERAL SELECTIVE  03/08/2017   IR ANGIOGRAM VISCERAL SELECTIVE  03/08/2017   IR EMBO ART  VEN HEMORR LYMPH EXTRAV  INC GUIDE ROADMAPPING  03/06/2017   IR EMBO ART  VEN HEMORR LYMPH EXTRAV  INC GUIDE ROADMAPPING  03/08/2017   IR US GUIDE VASC ACCESS RIGHT  03/06/2017   IR US GUIDE VASC ACCESS RIGHT  03/08/2017   left knee surgery     TOTAL HIP ARTHROPLASTY Left 05/17/2015   Procedure: LEFT TOTAL HIP ARTHROPLASTY ANTERIOR APPROACH;  Surgeon: Brenda Hitch, MD;  Location: [facility] OR;  Service: Orthopedics;  Laterality: Left;    There were no vitals filed for this  visit.   Subjective Assessment - 07/28/20 1516     Subjective Pt reports that she is feeling good.  She feels she is slowly improving.  Her back still hurts at times if she is standing up for an extended period, but did not hurt when she walked in today.    Pertinent History HTN, FALL RISK, L THA, CKD    Limitations Lifting;Standing;Walking    How long can you stand comfortably? 5 min    How long can you walk comfortably? 1-2 min d/t weakness    Patient Stated Goals Get low back stronger and less painful    Currently in Pain? No/denies    Pain Onset More than a month ago                               [name] Adult PT Treatment/Exercise - 07/28/20 0001       Lumbar Exercises: Stretches   Other Lumbar Stretch Exercise ball roll - 10x ea C/R/L      Lumbar Exercises: Aerobic   Nustep 5 min  L6      Lumbar Exercises: Standing   Row Limitations 3x10 GTB    Shoulder Extension Limitations 3x10 RTB    Other Standing Lumbar Exercises pallof press - 2x10 GTB (single)    Other Standing Lumbar Exercises hip hinge - lifting 10# KB from 12'' box 2x10, 1x10 at 15#      Lumbar Exercises: Seated   Sit to Stand Limitations 2x5 with UE support    Other Seated Lumbar Exercises OH ball lift from floor (blue PT ball)                      PT Short Term Goals - 07/12/20 1540       PT SHORT TERM GOAL #1   Title [name] will be >75% HEP compliant within 3 weeks to facilitate carryover between sessions and move towards eventual independent management of their condition.    Status New    Target Date 08/02/20               PT Long Term Goals - 07/12/20 1541       PT LONG TERM GOAL #1   Title [name] will improve 30'' STS (MCID 2) to >/= 10x to show improved LE strength and improved transfers.    Baseline 7x    Target Date 09/06/20      PT LONG TERM GOAL #2   Title [name] will improve gait speed to .9 m/s (.1 m/s MCID) to show functional improvement in ambulation    Baseline .63 over 30'    Target Date 09/06/20      PT LONG TERM GOAL #3   Title [name] will be able to stand for >30''' in semi-tandem stance, to show a significant improvement in balance in order to reduce fall risk    Baseline 5'' semi-tandem    Target Date 09/06/20                   Plan - 07/28/20 1610     Clinical Impression Statement Pt is doing well with therapy.  We were able to progress lumbar strengthening exercises.  Added in hip hinge.  Pt required mod cuing for form but was able to complete with good form from 12'' box.  Will continue to progress as able.  Personal Factors and Comorbidities Age;Fitness    Examination-Activity Limitations Carry;Lift;Bend;Squat;Stairs    Examination-Participation Restrictions Community Activity;Laundry;Shop;Yard  Work;Cleaning    Stability/Clinical Decision Making Stable/Uncomplicated    Rehab Potential Good    PT Frequency 2x / week    PT Duration 8 weeks    PT Treatment/Interventions ADLs/Self Care Home Management;Aquatic Therapy;Iontophoresis 4mg /ml Dexamethasone;Moist Heat;Gait training;Therapeutic activities;Therapeutic exercise;Neuromuscular re-education;Manual techniques;Dry needling;Vasopneumatic Device    PT Next Visit Plan Review HEP, gentle lumbar mobility and strengthening, LE strengthening R > L    PT Home Exercise Plan    Consulted and Agree with Plan of Care Patient             Patient will benefit from skilled therapeutic intervention in order to improve the following deficits and impairments:  Abnormal gait, Decreased activity tolerance, Decreased endurance, Decreased range of motion, Decreased strength, Hypomobility, Pain, Decreased balance  Visit Diagnosis: Chronic low back pain without sciatica, unspecified back pain laterality  Muscle weakness  Other abnormalities of gait and mobility  Balance problem     Problem List Patient Active Problem List   Diagnosis Date Noted   Lumbar back pain 06/16/2020   Assistance needed with transportation 06/16/2020   Healthcare maintenance 06/16/2020   Encounter to establish care 02/03/2020   LUQ pain 02/03/2020   Left hemiparesis (HCC) 09/17/2019   CKD (chronic kidney disease) stage 3, GFR 30-59 ml/min (HCC) 07/21/2018   Stable angina (HCC) 01/21/2018   Bradycardia 10/23/2017   Chest pain 09/29/2017   Acute lower GI bleeding 03/06/2017   HTN (hypertension) 03/06/2017   Acute blood loss anemia 03/06/2017   Hiatal hernia 11/28/2016   Chest pain, rule out acute myocardial infarction 11/27/2016   Aortic stenosis 05/24/2015   GERD without esophagitis 05/23/2015   Osteoarthritis of left hip 05/17/2015   Status post total replacement of left hip 05/17/2015   Diverticulosis 02/23/2012    04/22/2012 PT,  DPT 07/28/20 4:18 PM  [name] [address] [place], [place], [name] Phone: [phone]   Fax:  [phone]  Name: [name] MRN: [name] Date of Birth: 12/08/1923

## 2020-08-02 ENCOUNTER — Other Ambulatory Visit: Payer: Self-pay

## 2020-08-02 ENCOUNTER — Other Ambulatory Visit: Payer: 59 | Admitting: *Deleted

## 2020-08-02 ENCOUNTER — Ambulatory Visit: Payer: 59 | Admitting: Physical Therapy

## 2020-08-02 ENCOUNTER — Encounter: Payer: Self-pay | Admitting: Physical Therapy

## 2020-08-02 DIAGNOSIS — R0602 Shortness of breath: Secondary | ICD-10-CM

## 2020-08-02 DIAGNOSIS — D649 Anemia, unspecified: Secondary | ICD-10-CM

## 2020-08-02 DIAGNOSIS — I10 Essential (primary) hypertension: Secondary | ICD-10-CM

## 2020-08-02 DIAGNOSIS — I350 Nonrheumatic aortic (valve) stenosis: Secondary | ICD-10-CM

## 2020-08-02 DIAGNOSIS — R2689 Other abnormalities of gait and mobility: Secondary | ICD-10-CM

## 2020-08-02 DIAGNOSIS — M6281 Muscle weakness (generalized): Secondary | ICD-10-CM

## 2020-08-02 DIAGNOSIS — N1831 Chronic kidney disease, stage 3a: Secondary | ICD-10-CM

## 2020-08-02 DIAGNOSIS — M545 Low back pain, unspecified: Secondary | ICD-10-CM

## 2020-08-02 DIAGNOSIS — G8929 Other chronic pain: Secondary | ICD-10-CM

## 2020-08-02 DIAGNOSIS — R002 Palpitations: Secondary | ICD-10-CM

## 2020-08-02 DIAGNOSIS — I25118 Atherosclerotic heart disease of native coronary artery with other forms of angina pectoris: Secondary | ICD-10-CM

## 2020-08-02 NOTE — Therapy (Signed)
[facility] [address] [place], [place], [address] Phone: [phone]   Fax:  [phone]  Physical Therapy Treatment  Patient Details  Name: [name] MRN: [id number] Date of Birth: Mar 01, 1923 Referring Provider (PT): Moses Manners, MD   Encounter Date: 08/02/2020   PT End of Session - 08/02/20 1529     Visit Number 5    Number of Visits 16    Date for PT Re-Evaluation 09/06/20    Authorization Type MCR    Progress Note Due on Visit 10    PT Start Time 1530    PT Stop Time 1614    PT Time Calculation (min) 44 min    Equipment Utilized During Treatment Gait belt    Activity Tolerance Patient tolerated treatment well    Behavior During Therapy WFL for tasks assessed/performed             Past Medical History:  Diagnosis Date   Acid reflux    takes Nexium daily   Anemia    takes Ferrous Sulfate daily   Aortic stenosis    mild AS pk grad 18, mean grad 9, AVA 1.32 cm 03/2015 echo (Dr. Jacinto Halim)   Arthritis    Chronic kidney disease (CKD), stage III (moderate) (HCC)    Diverticulitis    Hematochezia 03/2015   High cholesterol    can't take the meds   History of blood transfusion 03/2015   no abnormal reaction noted   History of GI diverticular bleed    HTN (hypertension)    takes Losartan-HCTZ and Metoprolol daily   Joint pain    Joint swelling    Nocturia    Numbness    in legs   Peripheral edema    occasionally   Shortness of breath dyspnea    occasionally and with exertion   Slow urinary stream    at times    Past Surgical History:  Procedure Laterality Date   ABCESS DRAINAGE     abdominal abcess   cataract surgery Bilateral    CHOLECYSTECTOMY     DILATION AND CURETTAGE OF UTERUS     ESOPHAGOGASTRODUODENOSCOPY N/A 02/23/2015   Procedure: ESOPHAGOGASTRODUODENOSCOPY (EGD);  Surgeon: Carman Ching, MD;  Location: [facility] ENDOSCOPY;  Service: Endoscopy;  Laterality: N/A;   FLEXIBLE SIGMOIDOSCOPY N/A  02/24/2015   Procedure: FLEXIBLE SIGMOIDOSCOPY;  Surgeon: Carman Ching, MD;  Location: [facility] ENDOSCOPY;  Service: Endoscopy;  Laterality: N/A;   IR ANGIOGRAM SELECTIVE EACH ADDITIONAL VESSEL  03/06/2017   IR ANGIOGRAM SELECTIVE EACH ADDITIONAL VESSEL  03/06/2017   IR ANGIOGRAM SELECTIVE EACH ADDITIONAL VESSEL  03/06/2017   IR ANGIOGRAM SELECTIVE EACH ADDITIONAL VESSEL  03/08/2017   IR ANGIOGRAM VISCERAL SELECTIVE  03/06/2017   IR ANGIOGRAM VISCERAL SELECTIVE  03/06/2017   IR ANGIOGRAM VISCERAL SELECTIVE  03/06/2017   IR ANGIOGRAM VISCERAL SELECTIVE  03/08/2017   IR ANGIOGRAM VISCERAL SELECTIVE  03/08/2017   IR EMBO ART  VEN HEMORR LYMPH EXTRAV  INC GUIDE ROADMAPPING  03/06/2017   IR EMBO ART  VEN HEMORR LYMPH EXTRAV  INC GUIDE ROADMAPPING  03/08/2017   IR US GUIDE VASC ACCESS RIGHT  03/06/2017   IR US GUIDE VASC ACCESS RIGHT  03/08/2017   left knee surgery     TOTAL HIP ARTHROPLASTY Left 05/17/2015   Procedure: LEFT TOTAL HIP ARTHROPLASTY ANTERIOR APPROACH;  Surgeon: Kathryne Hitch, MD;  Location: [facility] OR;  Service: Orthopedics;  Laterality: Left;    There were no vitals filed for this  visit.   Subjective Assessment - 08/02/20 1535     Subjective Pt reports that she is feeling very good right now.  She continues to have pain when she stands or walks for long periods, but that is improving.    Pertinent History HTN, FALL RISK, L THA, CKD    Limitations Lifting;Standing;Walking    How long can you stand comfortably? 5 min    How long can you walk comfortably? 1-2 min d/t weakness    Patient Stated Goals Get low back stronger and less painful    Currently in Pain? No/denies    Pain Onset More than a month ago                       [facility] Adult PT Treatment/Exercise - 08/02/20 0001       Lumbar Exercises: Stretches   Other Lumbar Stretch Exercise ball roll - 10x ea C/R/L      Lumbar Exercises: Aerobic   Nustep 5 min L5 (LE only)      Lumbar Exercises: Standing   Row  Limitations 3x10 Blue TB    Shoulder Extension Limitations 3x GTB    Other Standing Lumbar Exercises pallof press - 2x10 Blue TB (single)    Other Standing Lumbar Exercises hip hinge - lifting 10# KB from 12'' box 2x10, 1x10 at 15#      Lumbar Exercises: Seated   Sit to Stand Limitations 3x5 with UE support    Other Seated Lumbar Exercises OH ball lift from floor (blue PT ball) then orange weighted ball - 2x10 ea                      PT Short Term Goals - 07/12/20 1540       PT SHORT TERM GOAL #1   Title [name] will be >75% HEP compliant within 3 weeks to facilitate carryover between sessions and move towards eventual independent management of their condition.    Status New    Target Date 08/02/20               PT Long Term Goals - 07/12/20 1541       PT LONG TERM GOAL #1   Title [name] will improve 30'' STS (MCID 2) to >/= 10x to show improved LE strength and improved transfers.    Baseline 7x    Target Date 09/06/20      PT LONG TERM GOAL #2   Title [name] will improve gait speed to .9 m/s (.1 m/s MCID) to show functional improvement in ambulation    Baseline .63 over 30'    Target Date 09/06/20      PT LONG TERM GOAL #3   Title [name] will be able to stand for >30''' in semi-tandem stance, to show a significant improvement in balance in order to reduce fall risk    Baseline 5'' semi-tandem    Target Date 09/06/20                   Plan - 08/02/20 1605     Clinical Impression Statement [name] is progressing well with therapy.  Today we concentrated on core strengthening and increasing activity tolerance.  Pt is able to tolerate longer bouts of standing as well as increased resistance with band exercises and hip hinging.  Pt reports no change in baseline pain following therapy.  Next session we will continue  current course, progressing as able/appropriate.  HEP was not updated as current program meets  patient's needs.  Pt will continue to benefit from skilled physical therapy to address remaining deficits and achieve listed goals.  Continue per POC.    Personal Factors and Comorbidities Age;Fitness    Examination-Activity Limitations Carry;Lift;Bend;Squat;Stairs    Examination-Participation Restrictions Community Activity;Laundry;Shop;Yard Work;Cleaning    Stability/Clinical Decision Making Stable/Uncomplicated    Rehab Potential Good    PT Frequency 2x / week    PT Duration 8 weeks    PT Treatment/Interventions ADLs/Self Care Home Management;Aquatic Therapy;Iontophoresis 4mg /ml Dexamethasone;Moist Heat;Gait training;Therapeutic activities;Therapeutic exercise;Neuromuscular re-education;Manual techniques;Dry needling;Vasopneumatic Device    PT Next Visit Plan Review HEP, gentle lumbar mobility and strengthening, LE strengthening R > L    PT Home Exercise Plan    Consulted and Agree with Plan of Care Patient             Patient will benefit from skilled therapeutic intervention in order to improve the following deficits and impairments:  Abnormal gait, Decreased activity tolerance, Decreased endurance, Decreased range of motion, Decreased strength, Hypomobility, Pain, Decreased balance  Visit Diagnosis: Chronic low back pain without sciatica, unspecified back pain laterality  Muscle weakness  Other abnormalities of gait and mobility  Balance problem     Problem List Patient Active Problem List   Diagnosis Date Noted   Lumbar back pain 06/16/2020   Assistance needed with transportation 06/16/2020   Healthcare maintenance 06/16/2020   Encounter to establish care 02/03/2020   LUQ pain 02/03/2020   Left hemiparesis (HCC) 09/17/2019   CKD (chronic kidney disease) stage 3, GFR 30-59 ml/min (HCC) 07/21/2018   Stable angina (HCC) 01/21/2018   Bradycardia 10/23/2017   Chest pain 09/29/2017   Acute lower GI bleeding 03/06/2017   HTN (hypertension) 03/06/2017   Acute  blood loss anemia 03/06/2017   Hiatal hernia 11/28/2016   Chest pain, rule out acute myocardial infarction 11/27/2016   Aortic stenosis 05/24/2015   GERD without esophagitis 05/23/2015   Osteoarthritis of left hip 05/17/2015   Status post total replacement of left hip 05/17/2015   Diverticulosis 02/23/2012    04/22/2012 PT, DPT 08/02/20 4:09 PM   [facility] [address] [place], [place], [place] Phone: [phone]   Fax:  [phone]  Name: NYAH SHEPHERD MRN: [name] Date of Birth: 1923-03-15

## 2020-08-03 LAB — CBC
Hematocrit: 35.4 % (ref 34.0–46.6)
Hemoglobin: 11.4 g/dL (ref 11.1–15.9)
MCH: 29.9 pg (ref 26.6–33.0)
MCHC: 32.2 g/dL (ref 31.5–35.7)
MCV: 93 fL (ref 79–97)
Platelets: 253 10*3/uL (ref 150–450)
RBC: 3.81 x10E6/uL (ref 3.77–5.28)
RDW: 13.4 % (ref 11.7–15.4)
WBC: 3.5 10*3/uL (ref 3.4–10.8)

## 2020-08-03 LAB — BASIC METABOLIC PANEL
BUN/Creatinine Ratio: 17 (ref 12–28)
BUN: 22 mg/dL (ref 10–36)
CO2: 24 mmol/L (ref 20–29)
Calcium: 9.7 mg/dL (ref 8.7–10.3)
Chloride: 106 mmol/L (ref 96–106)
Creatinine, Ser: 1.31 mg/dL — ABNORMAL HIGH (ref 0.57–1.00)
Glucose: 83 mg/dL (ref 65–99)
Potassium: 5.5 mmol/L — ABNORMAL HIGH (ref 3.5–5.2)
Sodium: 144 mmol/L (ref 134–144)
eGFR: 38 mL/min/{1.73_m2} — ABNORMAL LOW (ref >59)

## 2020-08-03 LAB — PRO B NATRIURETIC PEPTIDE: NT-Pro BNP: 611 pg/mL (ref 0–738)

## 2020-08-04 ENCOUNTER — Ambulatory Visit: Payer: 59 | Admitting: Physical Therapy

## 2020-08-04 ENCOUNTER — Other Ambulatory Visit: Payer: Self-pay

## 2020-08-04 DIAGNOSIS — R2689 Other abnormalities of gait and mobility: Secondary | ICD-10-CM

## 2020-08-04 DIAGNOSIS — M545 Low back pain, unspecified: Secondary | ICD-10-CM | POA: Diagnosis not present

## 2020-08-04 DIAGNOSIS — M6281 Muscle weakness (generalized): Secondary | ICD-10-CM

## 2020-08-04 DIAGNOSIS — G8929 Other chronic pain: Secondary | ICD-10-CM

## 2020-08-04 NOTE — Therapy (Addendum)
[facility] Outpatient Rehabilitation [facility] [address] [place], [place], [address] Phone: [phone]   Fax:  [phone]  Physical Therapy Treatment  Patient Details  Name: [name] MRN: [id number] Date of Birth: Dec 24, 1923 Referring Provider (PT): Moses Manners, MD     Encounter Date: 08/04/2020   PT End of Session - 08/04/20 1519     Visit Number 6    Number of Visits 16    Date for PT Re-Evaluation 09/06/20    Authorization Type MCR    Progress Note Due on Visit 10    Equipment Utilized During Treatment Gait belt    Activity Tolerance Patient tolerated treatment well    Behavior During Therapy [facility] for tasks assessed/performed           Time in 330p  Time out 415p Total time: 45 min  Past Medical History:  Diagnosis Date   Acid reflux    takes Nexium daily   Anemia    takes Ferrous Sulfate daily   Aortic stenosis    mild AS pk grad 18, mean grad 9, AVA 1.32 cm 03/2015 echo (Dr. Jacinto Halim)   Arthritis    Chronic kidney disease (CKD), stage III (moderate) (HCC)    Diverticulitis    Hematochezia 03/2015   High cholesterol    can't take the meds   History of blood transfusion 03/2015   no abnormal reaction noted   History of GI diverticular bleed    HTN (hypertension)    takes Losartan-HCTZ and Metoprolol daily   Joint pain    Joint swelling    Nocturia    Numbness    in legs   Peripheral edema    occasionally   Shortness of breath dyspnea    occasionally and with exertion   Slow urinary stream    at times    Past Surgical History:  Procedure Laterality Date   ABCESS DRAINAGE     abdominal abcess   cataract surgery Bilateral    CHOLECYSTECTOMY     DILATION AND CURETTAGE OF UTERUS     ESOPHAGOGASTRODUODENOSCOPY N/A 02/23/2015   Procedure: ESOPHAGOGASTRODUODENOSCOPY (EGD);  Surgeon: Carman Ching, MD;  Location: [facility] ENDOSCOPY;  Service: Endoscopy;  Laterality: N/A;   FLEXIBLE SIGMOIDOSCOPY N/A 02/24/2015   Procedure:  FLEXIBLE SIGMOIDOSCOPY;  Surgeon: Carman Ching, MD;  Location: [facility] ENDOSCOPY;  Service: Endoscopy;  Laterality: N/A;   IR ANGIOGRAM SELECTIVE EACH ADDITIONAL VESSEL  03/06/2017   IR ANGIOGRAM SELECTIVE EACH ADDITIONAL VESSEL  03/06/2017   IR ANGIOGRAM SELECTIVE EACH ADDITIONAL VESSEL  03/06/2017   IR ANGIOGRAM SELECTIVE EACH ADDITIONAL VESSEL  03/08/2017   IR ANGIOGRAM VISCERAL SELECTIVE  03/06/2017   IR ANGIOGRAM VISCERAL SELECTIVE  03/06/2017   IR ANGIOGRAM VISCERAL SELECTIVE  03/06/2017   IR ANGIOGRAM VISCERAL SELECTIVE  03/08/2017   IR ANGIOGRAM VISCERAL SELECTIVE  03/08/2017   IR EMBO ART  VEN HEMORR LYMPH EXTRAV  INC GUIDE ROADMAPPING  03/06/2017   IR EMBO ART  VEN HEMORR LYMPH EXTRAV  INC GUIDE ROADMAPPING  03/08/2017   IR US GUIDE VASC ACCESS RIGHT  03/06/2017   IR US GUIDE VASC ACCESS RIGHT  03/08/2017   left knee surgery     TOTAL HIP ARTHROPLASTY Left 05/17/2015   Procedure: LEFT TOTAL HIP ARTHROPLASTY ANTERIOR APPROACH;  Surgeon: Kathryne Hitch, MD;  [facility] OR;  Service: Orthopedics;  Laterality: Left;    There were no vitals filed for this visit.   Subjective Assessment - 08/04/20 1523  Subjective Pt reports that she is sleepy today.  Her back is not currenlty hurting, but she still has some issues when she sits or stands for long periods.    Pertinent History HTN, FALL RISK, L THA, CKD    Limitations Lifting;Standing;Walking    How long can you stand comfortably? 5 min    How long can you walk comfortably? 1-2 min d/t weakness    Patient Stated Goals Get low back stronger and less painful    Currently in Pain? No/denies    Pain Onset More than a month ago                               [facility] Adult PT Treatment/Exercise - 08/04/20 0001       Ambulation/Gait   Ambulation Distance (Feet) 30 Feet    Assistive device Straight cane    Gait velocity .66 m/s      High Level Balance   High Level Balance Comments semi-tandem stance: bouts of 30'',  semi-tandem on foam bouts of 30''      Lumbar Exercises: Stretches   Other Lumbar Stretch Exercise ball roll - 10x ea C/R/L      Lumbar Exercises: Aerobic   Nustep 5 min L6 (LE only)      Lumbar Exercises: Standing   Other Standing Lumbar Exercises hip hinge - 11'' x10 @ 15# 3x10 2x10 @ 25# 2x10 @ 30#                      PT Short Term Goals - 07/12/20 1540       PT SHORT TERM GOAL #1   Title [name] will be >75% HEP compliant within 3 weeks to facilitate carryover between sessions and move towards eventual independent management of their condition.    Status New    Target Date 08/02/20               PT Long Term Goals - 07/12/20 1541       PT LONG TERM GOAL #1   Title [name] will improve 30'' STS (MCID 2) to >/= 10x to show improved LE strength and improved transfers.    Baseline 7x    Target Date 09/06/20      PT LONG TERM GOAL #2   Title [name] will improve gait speed to .9 m/s (.1 m/s MCID) to show functional improvement in ambulation    Baseline .63 over 30'    Target Date 09/06/20      PT LONG TERM GOAL #3   Title [name] will be able to stand for >30''' in semi-tandem stance, to show a significant improvement in balance in order to reduce fall risk    Baseline 5'' semi-tandem    Target Date 09/06/20                   Plan - 08/04/20 1602     Clinical Impression Statement [name] is progressing well with therapy.  Today we concentrated on core strengthening, functional strengthening, and balance/proprioception.  Pt is able to substantial progress hip hinge today from 15# to 30#.  Pt reports no increase in baseline pain following therapy.  Next session we will continue current course, progressing as able/appropriate.  HEP was reviewed with patient, but left unchanged.  Pt will continue to benefit from skilled physical therapy to address remaining deficits  and achieve listed goals.  Continue per POC.     Personal Factors and Comorbidities Age;Fitness    Examination-Activity Limitations Carry;Lift;Bend;Squat;Stairs    Examination-Participation Restrictions Community Activity;Laundry;Shop;Yard Work;Cleaning    Stability/Clinical Decision Making Stable/Uncomplicated    Rehab Potential Good    PT Frequency 2x / week    PT Duration 8 weeks    PT Treatment/Interventions ADLs/Self Care Home Management;Aquatic Therapy;Iontophoresis 4mg /ml Dexamethasone;Moist Heat;Gait training;Therapeutic activities;Therapeutic exercise;Neuromuscular re-education;Manual techniques;Dry needling;Vasopneumatic Device    PT Next Visit Plan Review HEP, gentle lumbar mobility and strengthening, LE strengthening R > L    PT Home Exercise Plan    Consulted and Agree with Plan of Care Patient             Patient will benefit from skilled therapeutic intervention in order to improve the following deficits and impairments:  Abnormal gait, Decreased activity tolerance, Decreased endurance, Decreased range of motion, Decreased strength, Hypomobility, Pain, Decreased balance  Visit Diagnosis: Chronic low back pain without sciatica, unspecified back pain laterality  Other abnormalities of gait and mobility  Muscle weakness  Balance problem     Problem List Patient Active Problem List   Diagnosis Date Noted   Lumbar back pain 06/16/2020   Assistance needed with transportation 06/16/2020   Healthcare maintenance 06/16/2020   Encounter to establish care 02/03/2020   LUQ pain 02/03/2020   Left hemiparesis (HCC) 09/17/2019   CKD (chronic kidney disease) stage 3, GFR 30-59 ml/min (HCC) 07/21/2018   Stable angina (HCC) 01/21/2018   Bradycardia 10/23/2017   Chest pain 09/29/2017   Acute lower GI bleeding 03/06/2017   HTN (hypertension) 03/06/2017   Acute blood loss anemia 03/06/2017   Hiatal hernia 11/28/2016   Chest pain, rule out acute myocardial infarction 11/27/2016   Aortic stenosis 05/24/2015    GERD without esophagitis 05/23/2015   Osteoarthritis of left hip 05/17/2015   Status post total replacement of left hip 05/17/2015   Diverticulosis 02/23/2012    04/22/2012 PT, DPT 08/04/20 4:04 PM  [facility] Rehabilitation [facility] [address] [place], [place], [place] Phone: [phone]   Fax:  [phone]  Name: [name] MRN: [name] Date of Birth: Oct 07, 1923

## 2020-08-09 ENCOUNTER — Encounter: Payer: Self-pay | Admitting: Physical Therapy

## 2020-08-09 ENCOUNTER — Telehealth: Payer: Self-pay | Admitting: *Deleted

## 2020-08-09 ENCOUNTER — Other Ambulatory Visit: Payer: Self-pay

## 2020-08-09 ENCOUNTER — Telehealth: Payer: Self-pay | Admitting: Physician Assistant

## 2020-08-09 ENCOUNTER — Other Ambulatory Visit: Payer: 59 | Admitting: *Deleted

## 2020-08-09 ENCOUNTER — Ambulatory Visit: Payer: 59 | Admitting: Physical Therapy

## 2020-08-09 VITALS — BP 157/70 | HR 72

## 2020-08-09 DIAGNOSIS — R2689 Other abnormalities of gait and mobility: Secondary | ICD-10-CM

## 2020-08-09 DIAGNOSIS — Z79899 Other long term (current) drug therapy: Secondary | ICD-10-CM

## 2020-08-09 DIAGNOSIS — M545 Low back pain, unspecified: Secondary | ICD-10-CM

## 2020-08-09 DIAGNOSIS — G8929 Other chronic pain: Secondary | ICD-10-CM

## 2020-08-09 DIAGNOSIS — M6281 Muscle weakness (generalized): Secondary | ICD-10-CM

## 2020-08-09 LAB — BASIC METABOLIC PANEL
BUN/Creatinine Ratio: 14 (ref 12–28)
BUN: 21 mg/dL (ref 10–36)
CO2: 29 mmol/L (ref 20–29)
Calcium: 9.2 mg/dL (ref 8.7–10.3)
Chloride: 103 mmol/L (ref 96–106)
Creatinine, Ser: 1.47 mg/dL — ABNORMAL HIGH (ref 0.57–1.00)
Glucose: 120 mg/dL — ABNORMAL HIGH (ref 65–99)
Potassium: 4.3 mmol/L (ref 3.5–5.2)
Sodium: 140 mmol/L (ref 134–144)
eGFR: 33 mL/min/{1.73_m2} — ABNORMAL LOW (ref >59)

## 2020-08-09 NOTE — Telephone Encounter (Signed)
Pt c/o medication issue:  1. Name of Medication: losartan (COZAAR) 25 MG tablet  2. How are you currently taking this medication (dosage and times per day)? As prescribed  3. Are you having a reaction (difficulty breathing--STAT)? No  4. What is your medication issue? PT states the last time she had blood work her potassium was high.he states she was told to not take anything with potassium.She wants to confirm to take this medication

## 2020-08-09 NOTE — Telephone Encounter (Signed)
Yes.  She has been on this for a while and her potassium has been normal. Ok to continue. Tereso Newcomer, PA-C    08/09/2020 4:47 PM

## 2020-08-09 NOTE — Telephone Encounter (Signed)
Detailed message left (okay per DPR) that it is okay to continue Losartan. Please call the office with any further questions.

## 2020-08-09 NOTE — Telephone Encounter (Signed)
-----   Message from Loa Socks, LPN sent at 5/36/1443  4:43 PM EDT -----  ----- Message ----- From: Kennon Rounds Sent: 08/03/2020   4:41 PM EDT To: [facility] Triage  Creatinine stable.  K+ elevated.  Hgb normal.  BNP normal PLAN:  - Limit/reduce dietary K+ - Repeat BMET Monday of next week Tereso Newcomer, PA-C    08/03/2020 4:33 PM

## 2020-08-09 NOTE — Therapy (Signed)
[facility] Outpatient Rehabilitation [facility] [address] [place], [place], [address] Phone: [phone]   Fax:  [phone]  Physical Therapy Treatment  Patient Details  Name: [name] MRN: [id number] Date of Birth: 29-Jan-1924 Referring Provider (PT): Moses Manners, MD      Encounter Date: 08/09/2020   PT End of Session - 08/09/20 1517     Visit Number 7    Number of Visits 16    Date for PT Re-Evaluation 09/06/20    Authorization Type MCR    Progress Note Due on Visit 10    PT Start Time 1515    PT Stop Time 1558    PT Time Calculation (min) 43 min    Equipment Utilized During Treatment Gait belt    Activity Tolerance Patient tolerated treatment well    Behavior During Therapy WFL for tasks assessed/performed             Past Medical History:  Diagnosis Date   Acid reflux    takes Nexium daily   Anemia    takes Ferrous Sulfate daily   Aortic stenosis    mild AS pk grad 18, mean grad 9, AVA 1.32 cm 03/2015 echo (Dr. Jacinto Halim)   Arthritis    Chronic kidney disease (CKD), stage III (moderate) (HCC)    Diverticulitis    Hematochezia 03/2015   High cholesterol    can't take the meds   History of blood transfusion 03/2015   no abnormal reaction noted   History of GI diverticular bleed    HTN (hypertension)    takes Losartan-HCTZ and Metoprolol daily   Joint pain    Joint swelling    Nocturia    Numbness    in legs   Peripheral edema    occasionally   Shortness of breath dyspnea    occasionally and with exertion   Slow urinary stream    at times    Past Surgical History:  Procedure Laterality Date   ABCESS DRAINAGE     abdominal abcess   cataract surgery Bilateral    CHOLECYSTECTOMY     DILATION AND CURETTAGE OF UTERUS     ESOPHAGOGASTRODUODENOSCOPY N/A 02/23/2015   Procedure: ESOPHAGOGASTRODUODENOSCOPY (EGD);  Surgeon: Carman Ching, MD;  Location: [facility] ENDOSCOPY;  Service: Endoscopy;  Laterality: N/A;   FLEXIBLE  SIGMOIDOSCOPY N/A 02/24/2015   Procedure: FLEXIBLE SIGMOIDOSCOPY;  Surgeon: Carman Ching, MD;  Location[facility] ENDOSCOPY;  Service: Endoscopy;  Laterality: N/A;   IR ANGIOGRAM SELECTIVE EACH ADDITIONAL VESSEL  03/06/2017   IR ANGIOGRAM SELECTIVE EACH ADDITIONAL VESSEL  03/06/2017   IR ANGIOGRAM SELECTIVE EACH ADDITIONAL VESSEL  03/06/2017   IR ANGIOGRAM SELECTIVE EACH ADDITIONAL VESSEL  03/08/2017   IR ANGIOGRAM VISCERAL SELECTIVE  03/06/2017   IR ANGIOGRAM VISCERAL SELECTIVE  03/06/2017   IR ANGIOGRAM VISCERAL SELECTIVE  03/06/2017   IR ANGIOGRAM VISCERAL SELECTIVE  03/08/2017   IR ANGIOGRAM VISCERAL SELECTIVE  03/08/2017   IR EMBO ART  VEN HEMORR LYMPH EXTRAV  INC GUIDE ROADMAPPING  03/06/2017   IR EMBO ART  VEN HEMORR LYMPH EXTRAV  INC GUIDE ROADMAPPING  03/08/2017   IR US GUIDE VASC ACCESS RIGHT  03/06/2017   IR US GUIDE VASC ACCESS RIGHT  03/08/2017   left knee surgery     TOTAL HIP ARTHROPLASTY Left 05/17/2015   Procedure: LEFT TOTAL HIP ARTHROPLASTY ANTERIOR APPROACH;  Surgeon: Kathryne Hitch, MD;  Location: [facility] OR;  Service: Orthopedics;  Laterality: Left;    Vitals:   08/09/20  1615  BP: (!) 157/70  Pulse: 72  SpO2: 97%     Subjective Assessment - 08/09/20 1525     Subjective Pt reports that she is doing fair today.  She feels more fatigued and generally "out of it".    Pertinent History HTN, FALL RISK, L THA, CKD    Limitations Lifting;Standing;Walking    How long can you stand comfortably? 5 min    How long can you walk comfortably? 1-2 min d/t weakness    Patient Stated Goals Get low back stronger and less painful    Currently in Pain? No/denies   as high as 7/10 when standing for long periods   Pain Onset More than a month ago                               [facility] Adult PT Treatment/Exercise - 08/09/20 0001       Lumbar Exercises: Aerobic   Nustep 5 min L3 (LE only)      Lumbar Exercises: Standing   Row Limitations 30x Blue TB    Shoulder Extension  Limitations 30x GTB    Other Standing Lumbar Exercises Chop 10x ea with 13#, lift 10x ea with 3#    Other Standing Lumbar Exercises hip hinge - 8'' @ 35# 3x5                      PT Short Term Goals - 07/12/20 1540       PT SHORT TERM GOAL #1   Title [name] will be >75% HEP compliant within 3 weeks to facilitate carryover between sessions and move towards eventual independent management of their condition.    Status New    Target Date 08/02/20               PT Long Term Goals - 07/12/20 1541       PT LONG TERM GOAL #1   Title [name] will improve 30'' STS (MCID 2) to >/= 10x to show improved LE strength and improved transfers.    Baseline 7x    Target Date 09/06/20      PT LONG TERM GOAL #2   Title [name] will improve gait speed to .9 m/s (.1 m/s MCID) to show functional improvement in ambulation    Baseline .63 over 30'    Target Date 09/06/20      PT LONG TERM GOAL #3   Title [name] will be able to stand for >30''' in semi-tandem stance, to show a significant improvement in balance in order to reduce fall risk    Baseline 5'' semi-tandem    Target Date 09/06/20                   Plan - 08/09/20 1614     Clinical Impression Statement Today we concentrated on core strengthening and functional strengthening.  Pt is able to progress hip hinge to 8'' step and increased weight today.  Next session we will continue current course.  Pt reports no increase in baseline pain following therapy.  HEP was not updated as current program meets patient's needs.  Overall, [name] is progressing fair with therapy.  Pt will continue to benefit from skilled physical therapy to address remaining deficits and achieve listed goals.  Continue per POC.     Pt reports increased chest tightness over the last couple of  weeks (no report today in clinic).  She has chronic chest tightness and uses nitro PRN, but it seems to be increasing in  frequency per pt report.  Her BP was elevated today as well.  I sent a message to her PCP and encouraged [name] to make contact as well concerning these issues.    Personal Factors and Comorbidities Age;Fitness    Examination-Activity Limitations Carry;Lift;Bend;Squat;Stairs    Examination-Participation Restrictions Community Activity;Laundry;Shop;Yard Work;Cleaning    Stability/Clinical Decision Making Stable/Uncomplicated    Rehab Potential Good    PT Frequency 2x / week    PT Duration 8 weeks    PT Treatment/Interventions ADLs/Self Care Home Management;Aquatic Therapy;Iontophoresis 4mg /ml Dexamethasone;Moist Heat;Gait training;Therapeutic activities;Therapeutic exercise;Neuromuscular re-education;Manual techniques;Dry needling;Vasopneumatic Device    PT Next Visit Plan Review HEP, gentle lumbar mobility and strengthening, LE strengthening R > L    PT Home Exercise Plan    Consulted and Agree with Plan of Care Patient             Patient will benefit from skilled therapeutic intervention in order to improve the following deficits and impairments:  Abnormal gait, Decreased activity tolerance, Decreased endurance, Decreased range of motion, Decreased strength, Hypomobility, Pain, Decreased balance  Visit Diagnosis: Chronic low back pain without sciatica, unspecified back pain laterality  Other abnormalities of gait and mobility  Muscle weakness  Balance problem     Problem List Patient Active Problem List   Diagnosis Date Noted   Lumbar back pain 06/16/2020   Assistance needed with transportation 06/16/2020   Healthcare maintenance 06/16/2020   Encounter to establish care 02/03/2020   LUQ pain 02/03/2020   Left hemiparesis (HCC) 09/17/2019   CKD (chronic kidney disease) stage 3, GFR 30-59 ml/min (HCC) 07/21/2018   Stable angina (HCC) 01/21/2018   Bradycardia 10/23/2017   Chest pain 09/29/2017   Acute lower GI bleeding 03/06/2017   HTN (hypertension)  03/06/2017   Acute blood loss anemia 03/06/2017   Hiatal hernia 11/28/2016   Chest pain, rule out acute myocardial infarction 11/27/2016   Aortic stenosis 05/24/2015   GERD without esophagitis 05/23/2015   Osteoarthritis of left hip 05/17/2015   Status post total replacement of left hip 05/17/2015   Diverticulosis 02/23/2012    04/22/2012 PT, DPT 08/09/20 4:16 PM   [facility] Outpatient Rehabilitation [facility] [address] [place], [place], [place] Phone: [phone]   Fax:  [phone]  Name: [name] MRN: [name] Date of Birth: [birth year]

## 2020-08-11 ENCOUNTER — Encounter: Payer: Self-pay | Admitting: Physical Therapy

## 2020-08-11 ENCOUNTER — Other Ambulatory Visit: Payer: Self-pay

## 2020-08-11 ENCOUNTER — Ambulatory Visit: Payer: 59 | Admitting: Physical Therapy

## 2020-08-11 DIAGNOSIS — M6281 Muscle weakness (generalized): Secondary | ICD-10-CM

## 2020-08-11 DIAGNOSIS — G8929 Other chronic pain: Secondary | ICD-10-CM

## 2020-08-11 DIAGNOSIS — M545 Low back pain, unspecified: Secondary | ICD-10-CM

## 2020-08-11 DIAGNOSIS — R2689 Other abnormalities of gait and mobility: Secondary | ICD-10-CM

## 2020-08-11 NOTE — Therapy (Addendum)
[place], [place], [address] Phone: [phone]   Fax:  [phone]  PHYSICAL THERAPY DISCHARGE SUMMARY  Visits from Start of Care: 8  Current functional level related to goals / functional outcomes: See assessment/goals   Remaining deficits: See assessment/goals   Education / Equipment: See assessment   Patient agrees to discharge. Patient goals were partially met. Patient is being discharged due to the patient's request.   Patient Details  Name: [name] MRN: [id number] Date of Birth: 24-Jun-1923 Referring Provider (PT): Zenia Resides, MD   Encounter Date: 08/11/2020   PT End of Session - 08/11/20 1526     Visit Number 8    Number of Visits 16    Date for PT Re-Evaluation 09/06/20    Authorization Type MCR    Progress Note Due on Visit 10    PT Start Time 0330    PT Stop Time 0414    PT Time Calculation (min) 44 min    Equipment Utilized During Treatment Gait belt    Activity Tolerance Patient tolerated treatment well    Behavior During Therapy WFL for tasks assessed/performed             Past Medical History:  Diagnosis Date   Acid reflux    takes Nexium daily   Anemia    takes Ferrous Sulfate daily   Aortic stenosis    mild AS pk grad 18, mean grad 9, AVA 1.32 cm 03/2015 echo (Dr. Einar Gip)   Arthritis    Chronic kidney disease (CKD), stage III (moderate) ([place])    Diverticulitis    Hematochezia 03/2015   High cholesterol    can't take the meds   History of blood transfusion 03/2015   no abnormal reaction noted   History of GI diverticular bleed    HTN (hypertension)    takes Losartan-HCTZ and Metoprolol daily   Joint pain    Joint swelling    Nocturia    Numbness    in legs   Peripheral edema    occasionally   Shortness of breath dyspnea    occasionally and with exertion   Slow urinary stream    at times    Past Surgical History:  Procedure Laterality Date    ABCESS DRAINAGE     abdominal abcess   cataract surgery Bilateral    CHOLECYSTECTOMY     DILATION AND CURETTAGE OF UTERUS     ESOPHAGOGASTRODUODENOSCOPY N/A 02/23/2015   Procedure: ESOPHAGOGASTRODUODENOSCOPY (EGD);  Surgeon: Laurence Spates, MD;  Location: [facility] ENDOSCOPY;  Service: Endoscopy;  Laterality: N/A;   FLEXIBLE SIGMOIDOSCOPY N/A 02/24/2015   Procedure: FLEXIBLE SIGMOIDOSCOPY;  Surgeon: Laurence Spates, MD;  Location: [facility];  Service: Endoscopy;  Laterality: N/A;   IR ANGIOGRAM SELECTIVE EACH ADDITIONAL VESSEL  03/06/2017   IR ANGIOGRAM SELECTIVE EACH ADDITIONAL VESSEL  03/06/2017   IR ANGIOGRAM SELECTIVE EACH ADDITIONAL VESSEL  03/06/2017   IR ANGIOGRAM SELECTIVE EACH ADDITIONAL VESSEL  03/08/2017   IR ANGIOGRAM VISCERAL SELECTIVE  03/06/2017   IR ANGIOGRAM VISCERAL SELECTIVE  03/06/2017   IR ANGIOGRAM VISCERAL SELECTIVE  03/06/2017   IR ANGIOGRAM VISCERAL SELECTIVE  03/08/2017   IR ANGIOGRAM VISCERAL SELECTIVE  03/08/2017   IR EMBO ART  VEN HEMORR LYMPH EXTRAV  INC GUIDE ROADMAPPING  03/06/2017   IR EMBO ART  VEN HEMORR LYMPH EXTRAV  INC GUIDE ROADMAPPING  03/08/2017   IR US GUIDE VASC ACCESS RIGHT  03/06/2017   IR US GUIDE  VASC ACCESS RIGHT  03/08/2017   left knee surgery     TOTAL HIP ARTHROPLASTY Left 05/17/2015   Procedure: LEFT TOTAL HIP ARTHROPLASTY ANTERIOR APPROACH;  Surgeon: Mcarthur Rossetti, MD;  Location: [facility];  Service: Orthopedics;  Laterality: Left;    There were no vitals filed for this visit.   Subjective Assessment - 08/11/20 1535     Subjective Pt reports that she is feeling somewhat better today.  She reports she has called her PCP as well to set up an appointment to discuss intermittent chest tightness.  She denies any chest tightness currently.  She would like for this to be her last appt as it is difficult for her to get transportation to the appointments.    Pertinent History HTN, FALL RISK, L THA, CKD    Limitations Lifting;Standing;Walking    How long  can you stand comfortably? 5 min    How long can you walk comfortably? 1-2 min d/t weakness    Patient Stated Goals Get low back stronger and less painful    Currently in Pain? No/denies    Pain Onset More than a month ago                               [facility] Adult PT Treatment/Exercise - 08/11/20 0001       Self-Care   Self-Care Other Self-Care Comments    Other Self-Care Comments  working with patient to get home BP device working      Therapeutic Activites    Therapeutic Activities Other Therapeutic Activities    Other Therapeutic Activities checking and updating goals      Lumbar Exercises: Aerobic   Nustep 5 min L6                      PT Short Term Goals - 08/11/20 1537       PT SHORT TERM GOAL #1   Title Koren Bound will be >75% HEP compliant within 3 weeks to facilitate carryover between sessions and move towards eventual independent management of their condition.    Baseline 6/30: MET    Status Achieved    Target Date 08/02/20               PT Long Term Goals - 08/11/20 1537       PT LONG TERM GOAL #1   Title Koren Bound will improve 30'' STS (MCID 2) to >/= 10x to show improved LE strength and improved transfers.    Baseline 7x 6/30: 8x      PT LONG TERM GOAL #2   Title Koren Bound will improve gait speed to .9 m/s (.1 m/s MCID) to show functional improvement in ambulation    Baseline .63 over 30' 6/30: .46 over [place] #3   Title BERDENE ASKARI will be able to stand for >30''' in semi-tandem stance, to show a significant improvement in balance in order to reduce fall risk    Baseline 5'' semi-tandem 6/30:                   Plan - 08/11/20 1615     Clinical Impression Statement MISHEL SANS has progressed fair with therapy.  Improved impairments include: LE strength, balance.  Functional improvements include: transfers, time standing before onset of back pain is somewhat  improved.  Progressions  needed include: continue HEP at home.  Barriers to progress include: significant R knee pain.  Pt is making slow improvement in PT towards goals, but would like to to try HEP at home d/t difficulty with transportation; I agree with plan.  Please see comment section in "Goals" for specific progress on short term and long term goals established at evaluation. D/C home with HEP.    Personal Factors and Comorbidities Age;Fitness    Examination-Activity Limitations Carry;Lift;Bend;Squat;Stairs    Examination-Participation Restrictions Community Activity;Laundry;Shop;Yard Work;Cleaning    Stability/Clinical Decision Making Stable/Uncomplicated    Rehab Potential Good    PT Frequency 2x / week    PT Duration 8 weeks    PT Treatment/Interventions ADLs/Self Care Home Management;Aquatic Therapy;Iontophoresis 15m/ml Dexamethasone;Moist Heat;Gait training;Therapeutic activities;Therapeutic exercise;Neuromuscular re-education;Manual techniques;Dry needling;Vasopneumatic Device    PT Next Visit Plan Review HEP, gentle lumbar mobility and strengthening, LE strengthening R > L    PT Home Exercise Plan [id number]   Consulted and Agree with Plan of Care Patient             Patient will benefit from skilled therapeutic intervention in order to improve the following deficits and impairments:  Abnormal gait, Decreased activity tolerance, Decreased endurance, Decreased range of motion, Decreased strength, Hypomobility, Pain, Decreased balance  Visit Diagnosis: Chronic low back pain without sciatica, unspecified back pain laterality  Other abnormalities of gait and mobility  Muscle weakness  Balance problem     Problem List Patient Active Problem List   Diagnosis Date Noted   Lumbar back pain 06/16/2020   Assistance needed with transportation 06/16/2020   Healthcare maintenance 06/16/2020   Encounter to establish care 02/03/2020   LUQ pain 02/03/2020   [place] hemiparesis ([place]  09/17/2019   CKD (chronic kidney disease) stage 3, GFR 30-59 ml/min ([facility]) 07/21/2018   Stable angina ([place] 01/21/2018   Bradycardia 10/23/2017   Chest pain 09/29/2017   Acute lower GI bleeding 03/06/2017   HTN (hypertension) 03/06/2017   Acute blood loss anemia 03/06/2017   Hiatal hernia 11/28/2016   Chest pain, rule out acute myocardial infarction 11/27/2016   Aortic stenosis 05/24/2015   GERD without esophagitis 05/23/2015   Osteoarthritis of left hip 05/17/2015   Status post total replacement of left hip 05/17/2015   Diverticulosis 02/23/2012    KShearon BaloPT, DPT 08/11/20 4:28 PM   [facility]CV[facility]GWest [place] [address]Phone: [phone]  Fax:  [phone] Name: BAVIENDHA AZBELLMRN: [id number] of Birth: 123-Oct-1925

## 2020-08-16 ENCOUNTER — Ambulatory Visit: Payer: 59

## 2020-09-02 ENCOUNTER — Other Ambulatory Visit: Payer: Self-pay

## 2020-09-02 ENCOUNTER — Encounter: Payer: Self-pay | Admitting: Student

## 2020-09-02 ENCOUNTER — Ambulatory Visit (INDEPENDENT_AMBULATORY_CARE_PROVIDER_SITE_OTHER): Payer: 59 | Admitting: Student

## 2020-09-02 DIAGNOSIS — I10 Essential (primary) hypertension: Secondary | ICD-10-CM | POA: Diagnosis not present

## 2020-09-02 MED ORDER — HYDROCHLOROTHIAZIDE 12.5 MG PO CAPS: 12.5000 mg | ORAL_CAPSULE | Freq: Every day | ORAL | 0 refills | Status: DC

## 2020-09-02 MED ORDER — FAMOTIDINE 20 MG PO TABS: 20.0000 mg | ORAL_TABLET | Freq: Every day | ORAL | 0 refills | Status: DC

## 2020-09-02 NOTE — Assessment & Plan Note (Signed)
Patient was prescribed losartan 25 mg on 07/18/2020.  She discontinued this medication about 3 days ago stating that it was giving her unwanted side effects of increased swelling, cough, hair loss, weakness.  This could be effects of taking medication.  They could also be effects of her possible heart failure.  Her blood pressure today is 150/70.  We decided to try her on hydrochlorothiazide 12.5 mg daily.  This medication along with her furosemide 20 mg prescribed by the cardiologist should help reduce her edema and help with her breathing.  She is scheduled to come back in 2 weeks for follow-up visit and lab work.

## 2020-09-02 NOTE — Patient Instructions (Signed)
It was great to see you! Thank you for allowing me to participate in your care!  I recommend that you always bring your medications to each appointment as this makes it easy to ensure you are on the correct medications and helps [place] not miss when refills are needed.  Our plans for today:  - We are going to start you on a new medication today called hydrochlorothiazide.  This medication will help lower your blood pressure and also help to get the fluid off. -We are also starting you back on a medication called famotidine.  This will help with your acid reflux.  Please make an appointment in 2 weeks for lab work and to see how you are doing on this new medication. Take care and seek immediate care sooner if you develop any concerns.   Dr. Erick Alley, DO [facility]

## 2020-09-02 NOTE — Progress Notes (Signed)
    SUBJECTIVE:   CHIEF COMPLAINT / HPI:  Patient is a 87 year old female complaining of head edema in her lower extremities, intermittent cough, and acid reflux.  She has a past medical history significant for CKD stage III, HTN, GERD, and possible HFpEF per cardiology note.  She states she was prescribed losartan 25 mg on 07/18/2020.  After taking this medication she states she has had increased swelling in both lower extremities as well as increased coughing, weakness, and states her hair has been falling out.  She states she had similar issues when she took this medication over a year ago.  She would like to discontinue taking this medication.    OBJECTIVE:   Vitals:   09/02/20 1530  BP: (!) 150/70  Pulse: 66  SpO2: 98%     General: NAD, pleasant, able to participate in exam Cardiac: RRR, systolic murmur present Respiratory: CTAB, normal effort, No wheezes, rales or rhonchi Extremities: 1+ pitting edema of BLEs, significant edema of both feet Neuro: alert, no obvious focal deficits Psych: Normal affect and mood  ASSESSMENT/PLAN:   HTN Patient was prescribed losartan 25 mg on 07/18/2020.  She discontinued this medication about 3 days ago stating that it was giving her unwanted side effects of increased swelling, cough, hair loss, weakness.  This could be effects of taking medication.  They could also be effects of her possible heart failure.  Her blood pressure today is 150/70.  We decided to try her on hydrochlorothiazide 12.5 mg daily.  This medication along with her furosemide 20 mg prescribed by the cardiologist should help reduce her edema, help with her breathing and help lower her blood pressure.  She is scheduled to come back in 2 weeks for follow-up visit and lab work. -Stop taking losartan 25 mg -Start taking hydrochlorothiazide 12.5 mg daily -Follow-up in 2 weeks for lab work  GERD Patient states she still has acid reflux and is burping a lot.  The Tums she has been  taking do not seem to help.  She is going to start taking famotidine 20 mg daily. -Start famotidine 20 mg daily  Dr. Erick Alley, DO [facility]

## 2020-09-22 ENCOUNTER — Ambulatory Visit (INDEPENDENT_AMBULATORY_CARE_PROVIDER_SITE_OTHER): Payer: 59 | Admitting: Student

## 2020-09-22 ENCOUNTER — Encounter: Payer: Self-pay | Admitting: Student

## 2020-09-22 ENCOUNTER — Other Ambulatory Visit: Payer: Self-pay

## 2020-09-22 VITALS — BP 144/60 | HR 60 | Ht 63.0 in | Wt 198.2 lb

## 2020-09-22 DIAGNOSIS — R200 Anesthesia of skin: Secondary | ICD-10-CM

## 2020-09-22 DIAGNOSIS — I10 Essential (primary) hypertension: Secondary | ICD-10-CM

## 2020-09-22 DIAGNOSIS — M25561 Pain in right knee: Secondary | ICD-10-CM | POA: Diagnosis not present

## 2020-09-22 DIAGNOSIS — G8929 Other chronic pain: Secondary | ICD-10-CM

## 2020-09-22 DIAGNOSIS — R202 Paresthesia of skin: Secondary | ICD-10-CM

## 2020-09-22 MED ORDER — LIDOCAINE 5 % EX PTCH: 1.0000 | MEDICATED_PATCH | CUTANEOUS | 0 refills | Status: DC

## 2020-09-22 NOTE — Patient Instructions (Addendum)
It was great to see you! Thank you for allowing me to participate in your care!  I recommend that you always bring your medications to each appointment as this makes it easy to ensure you are on the correct medications and helps [place] not miss when refills are needed.  Our plans for today:  -We will do a basic metabolic panel today to check your electrolytes and kidney function. -I am prescribing you lidocaine patches for body aches and pains.  You can use these on your knees, hips, back, shoulders, anywhere you are experiencing pain.  You can alternate these with your Voltaren gel, just do not use them at the same time. -We will plan to follow-up in 1 month, or sooner if you need.  We are checking some labs today, I will call you if they are abnormal will send you a MyChart message or a letter if they are normal.  If you do not hear about your labs in the next 2 weeks please let us know.  Take care and seek immediate care sooner if you develop any concerns.   Dr. Erick Alley, DO [facility]

## 2020-09-22 NOTE — Progress Notes (Signed)
    SUBJECTIVE:   CHIEF COMPLAINT / HPI: Right Knee pain, tingling and numbness of hands  PERTINENT  PMH / PSH: CKD stage III, HTN, GERD, possible HFpEF per cardiology note.  Right knee pain Patient has severe osteoarthritis of her right knee.  She has been dealing with this pain for several years and states it is worse with increased movement.  She currently uses Voltaren gel for her knee pain regularly and states that it does help but wears off pretty quickly.  A progress note from 2018 from a orthopedic surgeon states there is not much to do for her knee pain besides steroid injections or consider knee replacement surgery.  Patient does not want a knee replacement surgery and would like to consider steroid injections in the future if needed.  Numbness and tingling of hands Patient states she has been experiencing numbness and tingling of bilateral hands for the past couple of months.  She states the numbness and tingling is mainly in all her fingers, and extends only slightly into her palm.  When she experiences this she shakes her hands to "help with blood flow" and states this helps slightly.  This happens at random times including at night, or during the day when she is brushing her hair or really doing anything with her fingers. OBJECTIVE:   BP (!) 144/60   Pulse 60   Ht 5\' 3"  (1.6 m)   Wt 198 lb 3.2 oz (89.9 kg)   SpO2 98%   BMI 35.11 kg/m    General: NAD, pleasant, able to participate in exam Extremities: Edema around ankles and feet, worse on the right but better than previous visit a few weeks ago MSK: No erythema or swelling around right knee, nontender to palpation.  Patient states it is painful with movement Neuro: alert, no obvious focal deficits Psych: Normal affect and mood  ASSESSMENT/PLAN:  Right Knee pain due to osteoarthritis Previous x-ray of right knee clearly shows severe loss of joint space. -Continue Voltaren gel. -Add lidocaine patches today as needed,  alternate with Voltaren gel -Consider steroid injections at future appointment if pain is not improved  Numbness and tingling of hands Consider carpal tunnel as possible differential, consider B12 deficiency.  Will not pursue full work-up at this time including anemia and thyroid due to patient's age -Obtain B12 level  Hypertension Blood pressure today is 144/60.  At patient's last visit she was taken off of losartan at her request and put on hydrochlorothiazide 12.5 mg daily -BMP Dr. , DO JAARS [facility]   Patient is advised to follow-up in 1 month for knee pain or sooner if needed    Acid reflux better Doing well on HCTZ prescribed at last visit Numbess in hands past couple months Cotton mouth

## 2020-09-23 ENCOUNTER — Telehealth: Payer: Self-pay

## 2020-09-23 ENCOUNTER — Encounter: Payer: Self-pay | Admitting: Student

## 2020-09-23 LAB — BASIC METABOLIC PANEL
BUN/Creatinine Ratio: 15 (ref 12–28)
BUN: 24 mg/dL (ref 10–36)
CO2: 24 mmol/L (ref 20–29)
Calcium: 9.8 mg/dL (ref 8.7–10.3)
Chloride: 104 mmol/L (ref 96–106)
Creatinine, Ser: 1.57 mg/dL — ABNORMAL HIGH (ref 0.57–1.00)
Glucose: 89 mg/dL (ref 65–99)
Potassium: 5.2 mmol/L (ref 3.5–5.2)
Sodium: 142 mmol/L (ref 134–144)
eGFR: 30 mL/min/{1.73_m2} — ABNORMAL LOW (ref >59)

## 2020-09-23 LAB — VITAMIN B12: Vitamin B-12: 1318 pg/mL — ABNORMAL HIGH (ref 232–1245)

## 2020-09-23 NOTE — Telephone Encounter (Signed)
Please see the below determination.       Called pharmacist with approval. They will call patient when medication is ready.   Veronda Prude, RN

## 2020-09-23 NOTE — Telephone Encounter (Signed)
Received fax from pharmacy, PA needed on Lidocaine patches 5%.  Clinical questions submitted via Cover My Meds.  Waiting on response, could take up to 72 hours.  Cover My Meds info: Key: [id number]  Veronda Prude, RN

## 2020-09-24 ENCOUNTER — Other Ambulatory Visit: Payer: Self-pay | Admitting: Physician Assistant

## 2020-09-24 ENCOUNTER — Other Ambulatory Visit: Payer: Self-pay | Admitting: Student

## 2020-10-21 ENCOUNTER — Other Ambulatory Visit: Payer: Self-pay | Admitting: Student in an Organized Health Care Education/Training Program

## 2020-10-21 ENCOUNTER — Other Ambulatory Visit: Payer: Self-pay | Admitting: Student

## 2020-10-21 ENCOUNTER — Other Ambulatory Visit: Payer: Self-pay | Admitting: Cardiovascular Disease

## 2020-10-21 ENCOUNTER — Other Ambulatory Visit: Payer: Self-pay | Admitting: Physician Assistant

## 2020-10-23 NOTE — Progress Notes (Signed)
Cardiology Office Note:    Date:  10/24/2020   ID:  [name], DOB 1923/09/24, MRN [id number]  PCP:  Erick Alley, DO  Cardiologist:  Kristeen Miss, MD  Electrophysiologist:  None   Referring MD: Leeroy Bock, MD   Chief Complaint  Patient presents with   Aortic Stenosis         [name] is a 87 y.o. female with a hx of mild aortic stenosis, chronic kidney disease stage III, hypertension and history of GI bleed.  She has minimal coronary artery disease by heart catheterization in 2003.  She was recently admitted to the hospital this past summer with episodes of chest pain.  She was thought to be a very poor candidate for invasive/interventional procedures.  She was seen for a return office visit by Lizabeth Leyden, nurse practitioner on October 23, 2017.  Seems to be going better.    The chest pain has improved on medical therapy  Has occasional palpitations  Occasional left leg swelling   Oct. 27, 2021:  [name] is a 87 year old female with a history of mild aortic stenosis, chronic kidney disease, hypertension and history of GI bleed.  She has minimal coronary artery disease by heart catheterization in 2003.  She is seen for follow-up visit today.  BP is a bit elevated today .  She checks her BP at home.  Is typically normal at home  No cp, Had some palpitations. Wore an event monitor  Showed NSR , sinus bradycardia,  Rare episode of nonsustained VT ( 4 beat run of NS VT)  No evidence of afib    Sept, 12, 2022:  [name] is seen for follow up of her AS, CKD, HTN and GI bleed.  Has no complaints Has a 1st degree AV block - will be reducing her metoprolol to 12. 5 BID    Past Medical History:  Diagnosis Date   Acid reflux    takes Nexium daily   Anemia    takes Ferrous Sulfate daily   Aortic stenosis    mild AS pk grad 18, mean grad 9, AVA 1.32 cm 03/2015 echo (Dr. Jacinto Halim)   Arthritis    Chronic kidney disease (CKD), stage III (moderate) (HCC)     Diverticulitis    Hematochezia 03/2015   High cholesterol    can't take the meds   History of blood transfusion 03/2015   no abnormal reaction noted   History of GI diverticular bleed    HTN (hypertension)    takes Losartan-HCTZ and Metoprolol daily   Joint pain    Joint swelling    Nocturia    Numbness    in legs   Peripheral edema    occasionally   Shortness of breath dyspnea    occasionally and with exertion   Slow urinary stream    at times    Past Surgical History:  Procedure Laterality Date   ABCESS DRAINAGE     abdominal abcess   cataract surgery Bilateral    CHOLECYSTECTOMY     DILATION AND CURETTAGE OF UTERUS     ESOPHAGOGASTRODUODENOSCOPY N/A 02/23/2015   Procedure: ESOPHAGOGASTRODUODENOSCOPY (EGD);  Surgeon: Carman Ching, MD;  Location: [facility] ENDOSCOPY;  Service: Endoscopy;  Laterality: N/A;   FLEXIBLE SIGMOIDOSCOPY N/A 02/24/2015   Procedure: FLEXIBLE SIGMOIDOSCOPY;  Surgeon: Carman Ching, MD;  Location: [facility] ENDOSCOPY;  Service: Endoscopy;  Laterality: N/A;   IR ANGIOGRAM SELECTIVE EACH ADDITIONAL VESSEL  03/06/2017   IR ANGIOGRAM SELECTIVE EACH ADDITIONAL VESSEL  03/06/2017   IR ANGIOGRAM SELECTIVE EACH ADDITIONAL VESSEL  03/06/2017   IR ANGIOGRAM SELECTIVE EACH ADDITIONAL VESSEL  03/08/2017   IR ANGIOGRAM VISCERAL SELECTIVE  03/06/2017   IR ANGIOGRAM VISCERAL SELECTIVE  03/06/2017   IR ANGIOGRAM VISCERAL SELECTIVE  03/06/2017   IR ANGIOGRAM VISCERAL SELECTIVE  03/08/2017   IR ANGIOGRAM VISCERAL SELECTIVE  03/08/2017   IR EMBO ART  VEN HEMORR LYMPH EXTRAV  INC GUIDE ROADMAPPING  03/06/2017   IR EMBO ART  VEN HEMORR LYMPH EXTRAV  INC GUIDE ROADMAPPING  03/08/2017   IR US GUIDE VASC ACCESS RIGHT  03/06/2017   IR US GUIDE VASC ACCESS RIGHT  03/08/2017   left knee surgery     TOTAL HIP ARTHROPLASTY Left 05/17/2015   Procedure: LEFT TOTAL HIP ARTHROPLASTY ANTERIOR APPROACH;  Surgeon: Kathryne Hitchhristopher Y Blackman, MD;  Location: [facility] OR;  Service: Orthopedics;  Laterality: Left;     Current Medications: Current Meds  Medication Sig   acetaminophen (TYLENOL) 650 MG CR tablet Take 650 mg by mouth daily.   amLODipine (NORVASC) 5 MG tablet Take 1 tablet (5 mg total) by mouth daily.   Cholecalciferol (VITAMIN D3) 2000 units TABS Take 2,000 Units by mouth daily.   diclofenac sodium (VOLTAREN) 1 % GEL Apply 2 g topically 2 (two) times daily as needed (pain).    famotidine (PEPCID) 20 MG tablet Take 1 tablet by mouth once daily   Ferrous Sulfate 27 MG TABS Take 1 tablet by mouth daily.   furosemide (LASIX) 20 MG tablet TAKE 1 TABLET BY MOUTH AS NEEDED FOR EDEMA   hydrochlorothiazide (MICROZIDE) 12.5 MG capsule Take 1 capsule by mouth once daily   isosorbide mononitrate (IMDUR) 120 MG 24 hr tablet Take 1 tablet by mouth once daily   lidocaine (LIDODERM) 5 % Place 1 patch onto the skin daily. Remove & Discard patch within 12 hours or as directed by MD   metoprolol tartrate (LOPRESSOR) 25 MG tablet Take 0.5 tablets (12.5 mg total) by mouth 2 (two) times daily.   Multiple Vitamin (MULITIVITAMIN WITH MINERALS) TABS Take 1 tablet by mouth daily.   nitroGLYCERIN (NITROSTAT) 0.4 MG SL tablet Place 0.4 mg under the tongue every 5 (five) minutes as needed for chest pain.    polyvinyl alcohol (LIQUIFILM TEARS) 1.4 % ophthalmic solution Place 1 drop into both eyes as needed for dry eyes.   PROAIR HFA 108 (90 Base) MCG/ACT inhaler Inhale 1-2 puffs into the lungs every 4 (four) hours as needed for shortness of breath or wheezing.   [DISCONTINUED] metoprolol tartrate (LOPRESSOR) 25 MG tablet Take 1 tablet (25 mg total) by mouth 2 (two) times daily.     Allergies:   Ezetimibe, Lipitor [atorvastatin], Pravachol [pravastatin], Statins, Zocor [simvastatin], and Cholestyramine   Social History   Socioeconomic History   Marital status: Widowed    Spouse name: Not on file   Number of children: Not on file   Years of education: Not on file   Highest education level: Not on file   Occupational History   Not on file  Tobacco Use   Smoking status: Former   Smokeless tobacco: Never   Tobacco comments:    quit smoking 8+ yrs ago  Vaping Use   Vaping Use: Never used  Substance and Sexual Activity   Alcohol use: No    Comment: quit yrs ago   Drug use: No   Sexual activity: Not Currently  Other Topics Concern   Not on file  Social History Narrative  Not on file   Social Determinants of Health   Financial Resource Strain: Not on file  Food Insecurity: Not on file  Transportation Needs: Not on file  Physical Activity: Not on file  Stress: Not on file  Social Connections: Not on file     Family History: The patient's family history includes Cancer in an other family member; Diabetes in an other family member; Heart disease in an other family member; Hypertension in an other family member.  ROS:   Please see the history of present illness.     All other systems reviewed and are negative.  EKGs/Labs/Other Studies Reviewed:    The following studies were reviewed today:   EKG:    Recent Labs: 08/02/2020: Hemoglobin 11.4; NT-Pro BNP 611; Platelets 253 09/22/2020: BUN 24; Creatinine, Ser 1.57; Potassium 5.2; Sodium 142  Recent Lipid Panel    Component Value Date/Time   CHOL (H) 01/06/2009 0149    209        ATP III CLASSIFICATION:  <200     mg/dL   Desirable  725-366  mg/dL   Borderline High  >=440    mg/dL   High          TRIG 51 01/06/2009 0149   HDL 56 01/06/2009 0149   CHOLHDL 3.7 01/06/2009 0149   VLDL 10 01/06/2009 0149   LDLCALC (H) 01/06/2009 0149    143        Total Cholesterol/HDL:CHD Risk Coronary Heart Disease Risk Table                     Men   Women  1/2 Average Risk   3.4   3.3  Average Risk       5.0   4.4  2 X Average Risk   9.6   7.1  3 X Average Risk  23.4   11.0        Use the calculated Patient Ratio above and the CHD Risk Table to determine the patient's CHD Risk.        ATP III CLASSIFICATION (LDL):  <100      mg/dL   Optimal  347-425  mg/dL   Near or Above                    Optimal  130-159  mg/dL   Borderline  956-387  mg/dL   High  >564     mg/dL   Very High    Physical Exam:     Physical Exam: Blood pressure (!) 142/68, pulse 69, height 5\' 3"  (1.6 m), weight 198 lb 12.8 oz (90.2 kg), SpO2 98 %.  GEN:  elderly female, moderately obese.  HEENT: Normal NECK: No JVD; No carotid bruits LYMPHATICS: No lymphadenopathy CARDIAC: RRR , no murmurs, rubs, gallops RESPIRATORY:  Clear to auscultation without rales, wheezing or rhonchi  ABDOMEN: Soft, non-tender, non-distended MUSCULOSKELETAL:  No edema; No deformity  SKIN: Warm and dry NEUROLOGIC:  Alert and oriented x 3    ASSESSMENT:    1. Essential hypertension   2. Coronary artery disease involving native coronary artery of native heart without angina pectoris   3. First degree AV block    PLAN:       Chest pain : Noncardiac.  2.  Mild aortic stenosis: She has very mild aortic stenosis.  At this point I do not think that she will ever need to have any procedures on her aortic valve.   3.  Hypertension:  Blood pressure is well controlled.  4.  First-degree AV block.  Her PR interval is greater than 300 ms.  She is on metoprolol 25 twice a day.  I like to reduce her dose to 12.5 mg twice a day.   Her echocardiogram from August, 2021 reveals normal left ventricular systolic function.  She has grade 1 diastolic dysfunction.  She has mild aortic stenosis.    Medication Adjustments/Labs and Tests Ordered: Current medicines are reviewed at length with the patient today.  Concerns regarding medicines are outlined above.  Orders Placed This Encounter  Procedures   EKG 12-Lead    Meds ordered this encounter  Medications   metoprolol tartrate (LOPRESSOR) 25 MG tablet    Sig: Take 0.5 tablets (12.5 mg total) by mouth 2 (two) times daily.    Dispense:  90 tablet    Refill:  3    Order Specific Question:   Supervising Provider     Answer:   Vesta Mixer [phone]     Patient Instructions  Medication Instructions:  Your physician has recommended you make the following change in your medication:   DECREASE Metoprolol tartrate (Lopressor) to 12.5 mg twice daily  *If you need a refill on your cardiac medications before your next appointment, please call your pharmacy*   Lab Work: None Ordered If you have labs (blood work) drawn today and your tests are completely normal, you will receive your results only by: MyChart Message (if you have MyChart) OR A paper copy in the mail If you have any lab test that is abnormal or we need to change your treatment, we will call you to review the results.   Testing/Procedures: None Ordered   Follow-Up: At [facility], you and your health needs are our priority.  As part of our continuing mission to provide you with exceptional heart care, we have created designated Provider Care Teams.  These Care Teams include your primary Cardiologist (physician) and Advanced Practice Providers (APPs -  Physician Assistants and Nurse Practitioners) who all work together to provide you with the care you need, when you need it.  We recommend signing up for the patient portal called "MyChart".  Sign up information is provided on this After Visit Summary.  MyChart is used to connect with patients for Virtual Visits (Telemedicine).  Patients are able to view lab/test results, encounter notes, upcoming appointments, etc.  Non-urgent messages can be sent to your provider as well.   To learn more about what you can do with MyChart, go to [email or web address].    Your next appointment:   6 month(s) on Wednesday March 1 at 2:45 pm  The format for your next appointment:   In Person  Provider:   Tereso Newcomer, PA-C       Signed, Kristeen Miss, MD  10/24/2020 10:17 AM    [facility]

## 2020-10-24 ENCOUNTER — Other Ambulatory Visit: Payer: Self-pay

## 2020-10-24 ENCOUNTER — Encounter: Payer: Self-pay | Admitting: Cardiovascular Disease

## 2020-10-24 ENCOUNTER — Ambulatory Visit (INDEPENDENT_AMBULATORY_CARE_PROVIDER_SITE_OTHER): Payer: 59 | Admitting: Cardiovascular Disease

## 2020-10-24 VITALS — BP 142/68 | HR 69 | Ht 63.0 in | Wt 198.8 lb

## 2020-10-24 DIAGNOSIS — I2510 Atherosclerotic heart disease of native coronary artery without angina pectoris: Secondary | ICD-10-CM | POA: Diagnosis not present

## 2020-10-24 DIAGNOSIS — I440 Atrioventricular block, first degree: Secondary | ICD-10-CM

## 2020-10-24 DIAGNOSIS — I10 Essential (primary) hypertension: Secondary | ICD-10-CM

## 2020-10-24 HISTORY — DX: Atrioventricular block, first degree: I44.0

## 2020-10-24 MED ORDER — METOPROLOL TARTRATE 25 MG PO TABS: 12.5000 mg | ORAL_TABLET | Freq: Two times a day (BID) | ORAL | 3 refills | Status: DC

## 2020-10-24 NOTE — Patient Instructions (Addendum)
Medication Instructions:  Your physician has recommended you make the following change in your medication:   DECREASE Metoprolol tartrate (Lopressor) to 12.5 mg (1/2 tablet) twice daily  *If you need a refill on your cardiac medications before your next appointment, please call your pharmacy*   Lab Work: None Ordered If you have labs (blood work) drawn today and your tests are completely normal, you will receive your results only by: MyChart Message (if you have MyChart) OR A paper copy in the mail If you have any lab test that is abnormal or we need to change your treatment, we will call you to review the results.   Testing/Procedures: None Ordered   Follow-Up: At [facility], you and your health needs are our priority.  As part of our continuing mission to provide you with exceptional heart care, we have created designated Provider Care Teams.  These Care Teams include your primary Cardiologist (physician) and Advanced Practice Providers (APPs -  Physician Assistants and Nurse Practitioners) who all work together to provide you with the care you need, when you need it.  We recommend signing up for the patient portal called "MyChart".  Sign up information is provided on this After Visit Summary.  MyChart is used to connect with patients for Virtual Visits (Telemedicine).  Patients are able to view lab/test results, encounter notes, upcoming appointments, etc.  Non-urgent messages can be sent to your provider as well.   To learn more about what you can do with MyChart, go to [email or web address].    Your next appointment:   6 month(s) on Wednesday March 1 at 2:45 pm  The format for your next appointment:   In Person  Provider:   Tereso Newcomer, PA-C

## 2020-10-28 ENCOUNTER — Other Ambulatory Visit: Payer: Self-pay

## 2020-10-28 NOTE — Patient Outreach (Signed)
Aging Gracefully Program  10/28/2020  [name] 1924-02-08 [id number]   [facility] Evaluation Interviewer made contact with patient. Aging Gracefully survey scheduled for 10/31/20 at 11 o'clock.  [facility] Care Management Assistant  [phone]

## 2020-10-31 ENCOUNTER — Other Ambulatory Visit: Payer: Self-pay

## 2020-10-31 NOTE — Patient Outreach (Signed)
Aging Gracefully Program  10/31/2020  [name] 07-27-1923 [id number]  [facility] Evaluation Interviewer made contact with patient. Aging Gracefully survey completed.   Interviewer will send referral to Rowe Pavy, RN and OT for follow up.   [facility] Care Management Assistant  [phone]

## 2020-11-07 ENCOUNTER — Encounter: Payer: Self-pay | Admitting: Occupational Therapy

## 2020-11-15 ENCOUNTER — Other Ambulatory Visit: Payer: Self-pay | Admitting: Occupational Therapy

## 2020-11-15 ENCOUNTER — Other Ambulatory Visit: Payer: Self-pay

## 2020-11-15 NOTE — Patient Instructions (Signed)
Goals Addressed             This Visit's Progress    Patient Stated       She would like to safer bathing. A walk in shower, new shower seat, grab bars, and hand held shower would help with this.     Patient Stated       She would like to feel more safer in and out of the house: At front door small porch/deck for her sit on out front with a ramp towards car, new front door or paint and adjust so door closes more easily, new storm door that closes more easily. Back door: new door or paint and adjust so it closes easily (new lower door knob), new screen door that closes normally. Fixing roof/gutters so water does not leak into kitchen when it rains,     Patient Stated       She would like to keep inside of house dry (leaks at back door and front bedroom window when it rains): New roof, gutters, front bedroom window.     Patient Stated       Training and development officer in home: New Government social research officer and new smoke detector in kitchen as well as check out why the one that is wired in sometimes alarms.

## 2020-11-15 NOTE — Patient Outreach (Signed)
Aging Gracefully Program  OT Initial Visit  11/15/2020  [name] 12-23-1923 [id number]  Visit:  1- Initial Visit  Start Time:  1430 End Time:  1445 Total Minutes:  15  CCAP: Typical Daily Routine: Typical Daily Routine:: She typically stays around her house unless she has a doctor's appointment or needs to go to the store. She does all of her basic ADls and IADls. She likes ot watch [name] and [name].She has a life line button to push if she needs help. What Types Of Care Problems Are You Having Throughout The Day?: Tub does not hold water, leaking at a window and back door when it rain--has to mop up water at back door. Furnace is not reliable What Kind Of Help Do You Receive?: Transportation sometimes to doctor appointments Do You Think You Need Other Types Of Help?: Need help with yard work What Do You Think Would Make Everyday Life Easier For You?: small deck and ramp at front door, new rail at back door, new roof and gutters, new main doors and screen doors that work properly Patient Reported Equipment: Patient Reported Equipment Currently Used: Occupational hygienist, Single [employer], Paediatric nurse, Raised Engineer, civil (consulting), Astronomer While Showering: Moderate Difficulty Do You:: Use A Device Importance Of Learning New Strategies:: Moderate Safety: A Little Risk Efficiency: Somewhat Intervention: Yes Other Comments:: walk in shower Functional Mobility-Climb 1 Flight Of Stairs: Climb 1 Flight Of Stairs: A Little Difficulty Do You:: Use A Device Importance Of Learning New Strategies:: Moderate Safety: A Little Risk Efficiency: Somewhat Intervention: Yes Other Comments:: ramp at front door   Readiness To Change Score:  Readiness to Change Score: 10  Home Environment Assessment: Outside Home Entry:: Front: 2 steps with 2 rails that she can reach both, small porch. Back: 4 steps with a single wooden rail (that is loose). Screen doors at both doors do not  work easily and both doors do not close easily.At back door water gets under the door and runs into the kitchen Bathroom:: Tub/shower combonation. Tub drain does not stop up to hold water. She has a clamp on grab bar on the side of the tub and a buit in grab bar on the far inside wall (it is low down). She uses a tub seat as well and a pan of water to wash off with. Smoke/CO2 Detector:: Has on smoke detector that she reports goes off intermittently for no reason. She had another on in the kitchen but it would also go off for no reason so she took it down.She has a battery powered carbon monoxide detector. Scientist, forensic:: She has a Hotel manager but it is not charged. Other Home Environment Concerns:: Leaking at front bedroom window    Patient Education:  Provided with safety at home checklist and getting up from a fall  Goals:  Goals Addressed             This Visit's Progress    Patient Stated       She would like to safer bathing. A walk in shower, new shower seat, grab bars, and hand held shower would help with this.     Patient Stated       She would like to feel more safer in and out of the house: At front door small porch/deck for her sit on out front with a ramp towards car, new front door or paint and adjust so door closes more easily, new storm door that closes more  easily. Back door: new door or paint and adjust so it closes easily (new lower door knob), new screen door that closes normally. Fixing roof/gutters so water does not leak into kitchen when it rains,     Patient Stated       She would like to keep inside of house dry (leaks at back door and front bedroom window when it rains): New roof, gutters, front bedroom window.     Patient Stated       Training and development officer in home: New Government social research officer and new smoke detector in kitchen as well as check out why the one that is wired in sometimes alarms.        Post Clinical Reasoning: Clinician View Of Client Situation::  [name] just turned 87 yo yesterday. She does extremely well for her age. She occassionally uses her rollator but more often uses her cane (sometimes inside, but always when she goes out). She drives herself to grocery store and the pharmarcy and occassionally to the doctor. Otherwise she has transportation services she uses (but reports they are not always reliable).. She does her own cooking, laundry, and cleaning but feels she really needs help with her yard. Her tub does not hold water so she sits on a seat in her tub/shower and runs a pan of water that she washes out of. She has a tub bar on the side of the tub and a grab bar in the tub on inside wall (but is a bit low to use for sit<>stand. She has a hight toilet and a versa frame around it. She has 2 steps to get into her house with rails on both sides (close enough to reach both).She has pain in her right knee and low back. Client View Of His/Her Situation:: She feels she does very well for age and she does. She manages all of her inside tasks by herself but needs help with outside tasks (she has tried to find someone, but they do not call her back). She is fearful that her furnace will not last all winter. Next Visit Plan:: Maybe a fire extinguisher  [name], OTR/L Aging Gracefully [phone]

## 2020-11-22 ENCOUNTER — Other Ambulatory Visit: Payer: Self-pay | Admitting: Student

## 2020-11-23 ENCOUNTER — Other Ambulatory Visit: Payer: Self-pay

## 2020-11-23 NOTE — Patient Outreach (Signed)
Aging Gracefully Program  11/23/2020  JANCY SPRANKLE [birth year] [id number]  Placed call to patient to schedule RN home visit. Offered 11/30/2020 [id number] and patient accepted.   Rowe Pavy RN, BSN, Careers adviser for [employer] Mobile: [phone]

## 2020-11-30 ENCOUNTER — Other Ambulatory Visit: Payer: Self-pay

## 2020-11-30 NOTE — Patient Outreach (Signed)
Aging Gracefully Program  RN Visit  11/30/2020  [name] [birth year] [id number]  Visit:   RN home visit #1  Start Time:   1125 End Time:   1230 Total Minutes:   65  Readiness To Change Score:     Universal RN Interventions: Calendar Distribution: Yes Exercise Review: No Medications: Yes Medication Changes: Yes Mood: Yes Pain: Yes PCP Advocacy/Support: No Fall Prevention: Yes Incontinence: Yes Clinician View Of Client Situation: Poorly lit living room, home cluttered. Patient appears independently living well.  Eats what she has cooked for several days.  Son assist with yard work. Continues to drive. Client View Of His/Her Situation: Client reports her main concern is for her roof and leaking water into the home.  Reports she has pain in the right knee.  Healthcare Provider Communication: Healthcare Provider Response According to RN: error According to Client, Did PCP Report Communication With An Aging Gracefully RN?: No  Clinician View of Client Situation: Clinician View Of Client Situation: Poorly lit living room, home cluttered. Patient appears independently living well.  Eats what she has cooked for several days.  Son assist with yard work. Continues to drive. Client's View of His/Her Situation: Client View Of His/Her Situation: Client reports her main concern is for her roof and leaking water into the home.  Reports she has pain in the right knee.  Medication Assessment: Do You Have Any Problems Paying For Medications?: No Where Does Client Store Medications?: Other: (living room) Can Client Read Pill Bottles?: No Does Client Use A Pillbox?: Yes Does Anyone Assist Client In Filling Pillbox?: No Does Anyone Assist Client In Taking Medications?: No Do You Take Vitamin D?: Yes Is Client Complaining Of Any Symptoms That Could Be Side Effects To Medications?: No Any Possible Changes In Medication Regimen?: No  Outpatient Encounter Medications as of 11/30/2020   Medication Sig   acetaminophen (TYLENOL) 650 MG CR tablet Take 650 mg by mouth daily.   amLODipine (NORVASC) 5 MG tablet Take 1 tablet by mouth once daily   Cholecalciferol (VITAMIN D3) 2000 units TABS Take 2,000 Units by mouth daily.   diclofenac sodium (VOLTAREN) 1 % GEL Apply 2 g topically 2 (two) times daily as needed (pain).    famotidine (PEPCID) 20 MG tablet Take 1 tablet by mouth once daily   Ferrous Sulfate 27 MG TABS Take 1 tablet by mouth daily.   furosemide (LASIX) 20 MG tablet TAKE 1 TABLET BY MOUTH AS NEEDED FOR EDEMA (Patient taking differently: 20 mg daily.)   hydrochlorothiazide (MICROZIDE) 12.5 MG capsule Take 1 capsule by mouth once daily   isosorbide mononitrate (IMDUR) 120 MG 24 hr tablet Take 1 tablet by mouth once daily   lidocaine (LIDODERM) 5 % Place 1 patch onto the skin daily. Remove & Discard patch within 12 hours or as directed by MD   metoprolol tartrate (LOPRESSOR) 25 MG tablet Take 0.5 tablets (12.5 mg total) by mouth 2 (two) times daily. (Patient taking differently: Take 25 mg by mouth daily.)   Multiple Vitamin (MULITIVITAMIN WITH MINERALS) TABS Take 1 tablet by mouth daily.   nitroGLYCERIN (NITROSTAT) 0.4 MG SL tablet Place 0.4 mg under the tongue every 5 (five) minutes as needed for chest pain.    polyvinyl alcohol (LIQUIFILM TEARS) 1.4 % ophthalmic solution Place 1 drop into both eyes as needed for dry eyes.   PROAIR HFA 108 (90 Base) MCG/ACT inhaler Inhale 1-2 puffs into the lungs every 4 (four) hours as needed for shortness of breath  or wheezing.   No facility-administered encounter medications on file as of 11/30/2020.     OT Update: pending Patent examiner.   Session Summary: Patient is self managing well. Reports pain in knee.  Follow up in 1 month   Goals Addressed               This Visit's Progress     Patient Stated (pt-stated)        Aging Gracefully RN   Goal: Patient will report decrease in right knee pain in  the next 120 days.  11/30/2020 Assessment:  Reviewed pain in patients right knee.  Reviewed with patient that she is taking tylenol for pain just 1 time per day. Also using Voltaren gel  Interventions: Reviewed with patient other options for pain control.  Increasing the number of times taking there tylenol, Heat and ice applications, Knee brace.  Patient reports to me that she is not interested in increasing her tylenol.  Reviewed with patient at next home visit we would review a home exercise plan to help with strength in her legs.  PLAN: next home visit planned for 12/26/2020  Rowe Pavy RN, BSN, CEN RN Case Manager for [employer] [facility] Mobile: [phone]        Rowe Pavy RN, BSN, Careers adviser for [employer] Mobile: [phone]

## 2020-12-26 ENCOUNTER — Other Ambulatory Visit: Payer: Self-pay | Admitting: Student

## 2021-01-04 ENCOUNTER — Other Ambulatory Visit: Payer: Self-pay

## 2021-01-04 NOTE — Patient Instructions (Signed)
Visit Information  Thank you for taking time to visit with me today. Please don't hesitate to contact me if I can be of assistance to you before our next scheduled telephone appointment.  Following are the goals we discussed today:  (Copy and paste patient goals from clinical care plan here)  Our next appointment is  in person   on 02/15/2021 at 215pm  Please call the care guide team at [phone] if you need to cancel or reschedule your appointment.   Please call the Suicide and Crisis Lifeline: 988 call the [place] National Suicide Prevention Lifeline: 830-298-0936 or TTY: 719-445-7819 TTY 620-879-4584) to talk to a trained counselor call 1-800-273-TALK (toll free, 24 hour hotline) go to [facility] [address], [place] [phone]) call 911 if you are experiencing a Mental Health or Behavioral Health Crisis or need someone to talk to.  The patient verbalized understanding of instructions, educational materials, and care plan provided today and agreed to receive a mailed copy of patient instructions, educational materials, and care plan.   Telephone follow up appointment with care management team member scheduled for:02/15/2021   Goals Addressed               This Visit's Progress     Patient Stated (pt-stated)        Aging Gracefully RN   Goal: Patient will report decrease in right knee pain in the next 120 days.  11/30/2020 Assessment:  Reviewed pain in patients right knee.  Reviewed with patient that she is taking tylenol for pain just 1 time per day. Also using Voltaren gel  Interventions: Reviewed with patient other options for pain control.  Increasing the number of times taking there tylenol, Heat and ice applications, Knee brace.  Patient reports to me that she is not interested in increasing her tylenol.  Reviewed with patient at next home visit we would review a home exercise plan to help with strength in her legs.  PLAN:  next home visit planned for 12/26/2020  Rowe Pavy RN, BSN, CEN RN Case Manager for Aging Gracefully [facility] Mobile: [phone]   01/04/2021 Assessment: Denies any pain today.  Interventions: provided written exercise plan and reviewed with patient. Demonstrated how to do home exercises and patient was able to verbalize understanding. Encouraged patient to do exercises daily. Patient concerned about her heat.  Email to send to [employer] with community housing solutions Plan: next home visit scheduled for 02/15/2021.  Rowe Pavy RN, BSN, Careers adviser for [employer] Mobile: [phone]

## 2021-01-04 NOTE — Patient Outreach (Signed)
Aging Gracefully Program  RN Visit  01/04/2021  [name] 1923-11-25 [id number]  Visit:   RN home visit #2  Start Time:   1115 End Time:   7846 Total Minutes:   30  Readiness To Change Score:     Universal RN Interventions: Calendar Distribution: Yes Exercise Review: Yes Medications: Yes Medication Changes: Yes Mood: Yes Pain: Yes PCP Advocacy/Support: No Fall Prevention: Yes Incontinence: Yes Clinician View Of Client Situation: Pooely lit home. Clutter noted.Wrinkled carpets.  Neighbor met me at the home and states EMS was there last night. Client View Of His/Her Situation: Patient answered the door without difficulty. reports she has chest pain at 2am and took her nitro and call EMS. reports she had an ekg with no changes.  Reports that her blood pressure was very high. States that she has decided to start eating garlic and vingear to help with her blood pressure. Reports medications do not work for her.  Denies any pain today. denies any falls. Denies any changes to medications. reports that she has been cold and does not think her heat is working.  Healthcare Provider Communication: Did Higher education careers adviser With [employer] Provider?: No Healthcare Provider Response According to RN: none According to Client, Did PCP Report Communication With An Aging Gracefully RN?: No Healthcare Provider Response According To Client: none  Clinician View of Client Situation: Clinician View Of Client Situation: Pooely lit home. Clutter noted.Wrinkled carpets.  Neighbor met me at the home and states EMS was there last night. Client's View of His/Her Situation: Client View Of His/Her Situation: Patient answered the door without difficulty. reports she has chest pain at 2am and took her nitro and call EMS. reports she had an ekg with no changes.  Reports that her blood pressure was very high. States that she has decided to start eating garlic and vingear to help with her blood pressure.  Reports medications do not work for her.  Denies any pain today. denies any falls. Denies any changes to medications. reports that she has been cold and does not think her heat is working.  Medication Assessment:denies any changes in medications    OT Update: pending community housing solutions assessments.  Session Summary: Doing well today. No complaints of pain. Reports chest pain at 2 am and she took nitro and called EMS.  Reports to me that Chest pain went away and she did not go to the hospital.  Reviewed that she did the right thing to call EMS. Encouraged her to call EMS if chest pain returns.    Goals Addressed               This Visit's Progress     Patient Stated (pt-stated)        Aging Gracefully RN   Goal: Patient will report decrease in right knee pain in the next 120 days.  11/30/2020 Assessment:  Reviewed pain in patients right knee.  Reviewed with patient that she is taking tylenol for pain just 1 time per day. Also using Voltaren gel  Interventions: Reviewed with patient other options for pain control.  Increasing the number of times taking there tylenol, Heat and ice applications, Knee brace.  Patient reports to me that she is not interested in increasing her tylenol.  Reviewed with patient at next home visit we would review a home exercise plan to help with strength in her legs.  PLAN: next home visit planned for 12/26/2020  Tomasa Rand RN, BSN, CEN RN Case Manager  for [place]: [phone]   01/04/2021 Assessment: Denies any pain today.  Interventions: provided written exercise plan and reviewed with patient. Demonstrated how to do home exercises and patient was able to verbalize understanding. Encouraged patient to do exercises daily. Patient concerned about her heat.  Email to send to [employer] with community housing solutions Plan: next home visit scheduled for 02/15/2021.  Tomasa Rand RN, BSN, Programmer, applications for [employer] Mobile: [phone]         Tomasa Rand RN, BSN, Careers information officer for [employer] Mobile: [phone]

## 2021-02-03 ENCOUNTER — Ambulatory Visit: Payer: 59 | Admitting: Student

## 2021-02-07 ENCOUNTER — Ambulatory Visit: Payer: 59 | Admitting: Student

## 2021-02-07 ENCOUNTER — Other Ambulatory Visit: Payer: Self-pay

## 2021-02-07 ENCOUNTER — Ambulatory Visit (INDEPENDENT_AMBULATORY_CARE_PROVIDER_SITE_OTHER): Payer: 59

## 2021-02-07 ENCOUNTER — Ambulatory Visit (INDEPENDENT_AMBULATORY_CARE_PROVIDER_SITE_OTHER): Payer: 59 | Admitting: Family Medicine

## 2021-02-07 VITALS — BP 140/69 | HR 54 | Ht 63.0 in | Wt 197.1 lb

## 2021-02-07 DIAGNOSIS — Z23 Encounter for immunization: Secondary | ICD-10-CM

## 2021-02-07 DIAGNOSIS — R202 Paresthesia of skin: Secondary | ICD-10-CM | POA: Diagnosis not present

## 2021-02-07 DIAGNOSIS — R42 Dizziness and giddiness: Secondary | ICD-10-CM | POA: Diagnosis not present

## 2021-02-07 DIAGNOSIS — R200 Anesthesia of skin: Secondary | ICD-10-CM

## 2021-02-07 NOTE — Patient Instructions (Signed)
It was great seeing you today!  You were seen for your hand numbness and tingling which is likely due to carpal tunnel. As we discussed I recommend using wrist splints at night to support your hand and wrists (you can get them at the pharmacy or [facility]) and if you still have pain and numbness we can discuss other options and testing, we will check on that at your follow up appointment  Because of your dizziness and low heart rate I recommend you stop taking the Metoprolol and HCTZ   Please check-out at the front desk before leaving the clinic. Schedule to see your PCP for follow up in 2 weeks, but if you need to be seen earlier than that for any new issues we're happy to fit you in, just give [place] a call!   Feel free to call with any questions or concerns at any time, at [phone].   Take care,  Dr. Cora Collum [facility]   Carpal Tunnel Syndrome Carpal tunnel syndrome is a condition that causes pain, weakness, and numbness in your hand and arm. Numbness is when you cannot feel an area in your body. The carpal tunnel is a narrow area that is on the palm side of your wrist. Repeated wrist motion or certain diseases may cause swelling in the tunnel. This swelling can pinch the main nerve in the wrist. This nerve is called the median nerve. What are the causes? This condition may be caused by: Moving your hand and wrist over and over again while doing a task. Injury to the wrist. Arthritis. A sac of fluid (cyst) or abnormal growth (tumor) in the carpal tunnel. Fluid buildup during pregnancy. Use of tools that vibrate. Sometimes the cause is not known. What increases the risk? The following factors may make you more likely to have this condition: Having a job that makes you do these things: Move your hand over and over again. Work with tools that vibrate, such as drills or sanders. Being a woman. Having diabetes, obesity, thyroid problems, or kidney  failure. What are the signs or symptoms? Symptoms of this condition include: A tingling feeling in your fingers. Tingling or loss of feeling in your hand. Pain in your entire arm. This pain may get worse when you bend your wrist and elbow for a long time. Pain in your wrist that goes up your arm to your shoulder. Pain that goes down into your palm or fingers. Weakness in your hands. You may find it hard to grab and hold items. You may feel worse at night. How is this treated? This condition may be treated with: Lifestyle changes. You will be asked to stop or change the activity that caused your problem. Doing exercises and activities that make bones, muscles, and tendons stronger (physical therapy). Learning how to use your hand again (occupational therapy). Medicines for pain and swelling. You may have injections in your wrist. A wrist splint or brace. Surgery. Follow these instructions at home: If you have a splint or brace: Wear the splint or brace as told by your doctor. Take it off only as told by your doctor. Loosen the splint if your fingers: Tingle. Become numb. Turn cold and blue. Keep the splint or brace clean. If the splint or brace is not waterproof: Do not let it get wet. Cover it with a watertight covering when you take a bath or a shower. Managing pain, stiffness, and swelling If told, put ice on the painful  area: If you have a removable splint or brace, remove it as told by your doctor. Put ice in a plastic bag. Place a towel between your skin and the bag. Leave the ice on for 20 minutes, 2-3 times per day. Do not fall asleep with the cold pack on your skin. Take off the ice if your skin turns bright red. This is very important. If you cannot feel pain, heat, or cold, you have a greater risk of damage to the area. Move your fingers often to reduce stiffness and swelling. General instructions Take over-the-counter and prescription medicines only as told by your  doctor. Rest your wrist from any activity that may cause pain. If needed, talk with your boss at work about changes that can help your wrist heal. Do exercises as told by your doctor, physical therapist, or occupational therapist. Keep all follow-up visits. Contact a doctor if: You have new symptoms. Medicine does not help your pain. Your symptoms get worse. Get help right away if: You have very bad numbness or tingling in your wrist or hand. Summary Carpal tunnel syndrome is a condition that causes pain in your hand and arm. It is often caused by repeated wrist motions. Lifestyle changes and medicines are used to treat this problem. Surgery may help in very bad cases. Follow your doctor's instructions about wearing a splint, resting your wrist, keeping follow-up visits, and calling for help. This information is not intended to replace advice given to you by your health care provider. Make sure you discuss any questions you have with your health care provider. Document Revised: 06/11/2019 Document Reviewed: 06/11/2019 Elsevier Patient Education  2022 [employer].

## 2021-02-07 NOTE — Progress Notes (Signed)
° ° °  SUBJECTIVE:   CHIEF COMPLAINT / HPI:   [name] is a 87 yo here for hand pain and dizzy spells  States she has had hand pain, numbness and tingling for months but have worsened in the past 2- 3 weeks. She states it feels like needles pricking her hands. Wakes her up out of her sleep but also does bother her throughout the day as well. States its hard to grip things. States she does use voltaren gel. Massages her hands. Shaking out her hands also helps. Denies taking medication for nerve pain.   She also endorses dizzy spells that started ago. States this happens when she gets up from laying to sitting or from sitting to standing.  States her head spins and apple cider vinegar helps.  Head movements do not worsen it. Denies any syncopal episodes     OBJECTIVE:   BP 140/69    Ht 5\' 3"  (1.6 m)    Wt 197 lb 2 oz (89.4 kg)    BMI 34.92 kg/m    Physical exam  General: well appearing, NAD Cardiovascular: RRR, no murmurs Lungs: CTAB. Normal WOB Abdomen: soft, non-distended, non-tender Skin: warm, dry. No edema Extremities: no visible hand deformities, erythema or edema. Reduced grip strength  ASSESSMENT/PLAN:   No problem-specific Assessment & Plan notes found for this encounter.   Bilateral hand numbness and tingling Endorses numbness and tingling over the past several months, worsened in past 2-3 weeks. Has tried massaging hands, has not taken medication for it. Shaking out her hands helps with the tingling. On exam hands non erythematous and non edematous. Grip strength reduced. Symptoms likely due to carpal tunnel syndrome, with arthritis contributing as well. Discussed starting with conservative management and wearing wrist splints at night. Recommended follow up in 2 weeks and if pain and numbness persist can discuss referral for further evaluation and treatment. Also recommend potentially checking TSH if symptoms not improved at follow up. Patient agreeable with  plan.  Dizziness Endorses several weeks of "head spinning" when she goes from laying to sitting or sitting to standing. Denies dizziness with head movements or syncopal episodes. HR initially low at 42 but likely an error given her cold hands and initially the pulse ox not reading it, and when I checked HR was 54 bpm. Physical exam unremarkable. Dizziness is likely due to orthostatic hypotension which she states has been discussed with her in the past. She has been advised to move very slowly when changing positions and once she stands to wait for a bit before walking. Medications could also certainly be contributing. She was restarted on HCTZ in Sept and feels she has had symptoms since restarting that. Recommended discontinuing the HCTZ as well as the Metoprolol given her low HR. She also states she is due to see her cardiologist and will check in with them as well. Recommended follow up with PCP in about 2 weeks to check on dizziness and hand symptoms discussed above.    12-01-1974, DO [facility]

## 2021-02-15 ENCOUNTER — Other Ambulatory Visit: Payer: Self-pay

## 2021-02-15 NOTE — Patient Instructions (Signed)
Goals Addressed               This Visit's Progress     Patient Stated (pt-stated)        Aging Gracefully RN   Goal: Patient will report decrease in right knee pain in the next 120 days.  11/30/2020 Assessment:  Reviewed pain in patients right knee.  Reviewed with patient that she is taking tylenol for pain just 1 time per day. Also using Voltaren gel  Interventions: Reviewed with patient other options for pain control.  Increasing the number of times taking there tylenol, Heat and ice applications, Knee brace.  Patient reports to me that she is not interested in increasing her tylenol.  Reviewed with patient at next home visit we would review a home exercise plan to help with strength in her legs.  PLAN: next home visit planned for 12/26/2020  Rowe Pavy RN, BSN, CEN RN Case Manager for Aging Grace[facility] Mobile: [phone]   01/04/2021 Assessment: Denies any pain today.  Interventions: provided written exercise plan and reviewed with patient. Demonstrated how to do home exercises and patient was able to verbalize understanding. Encouraged patient to do exercises daily. Patient concerned about her heat.  Email to send to [employer] with community housing solutions Plan: next home visit scheduled for 02/15/2021.  Rowe Pavy RN, BSN, CEN RN Case Production designer, theatre/television/film for [employer] [facility] Mobile: [phone]   02/15/2021 Assessment:  Reports she continues to have hip pain and hand pain.  Reports pain level today of 2/10.  Patient reports that she is not able to do exercises right now due to heart issues.   Interventions:  Reviewed importance of follow up with MD. Encouraged patient to call cardiology and get office visit changed to a sooner date.   Plan: Follow up in 1 month.  Rowe Pavy RN, BSN, Careers adviser for [employer] Mobile: [phone]        Rowe Pavy RN, BSN, Careers adviser  for [employer] Mobile: [phone]

## 2021-02-15 NOTE — Patient Outreach (Signed)
Aging Gracefully Program  RN Visit  02/15/2021  [name] 22-Aug-1923 [id number]  Visit:   RN home visit #3  Start Time:   1400 End Time:   1430 Total Minutes:   30  Readiness To Change Score:     Universal RN Interventions: Calendar Distribution: Yes Exercise Review: Yes Medications: Yes Medication Changes: Yes Mood: Yes Pain: Yes PCP Advocacy/Support: Yes Fall Prevention: Yes Incontinence: Yes Clinician View Of Client Situation: Home well lit today.  Home with odd odor. Patient answered the door without problems.  Patient ambulating well. Wearing wrist BP cuff.  with readings of  187/99  HR 60. Client View Of His/Her Situation: Patient reports that MD adjusted BP medications. Patient reports that she has not had any dizziness since medications adjustment. Patient reports that she continues to have pain in her hand. Encouraged patient to call cardiology to move appointment up. Patient denies falls.  Healthcare Provider Communication: Did Surveyor, mining With [employer] Provider?: No According to Client, Did PCP Report Communication With An Aging Gracefully RN?: No  Clinician View of Client Situation: Clinician View Of Client Situation: Home well lit today.  Home with odd odor. Patient answered the door without problems.  Patient ambulating well. Wearing wrist BP cuff.  with readings of  187/99  HR 60. Client's View of His/Her Situation: Client View Of His/Her Situation: Patient reports that MD adjusted BP medications. Patient reports that she has not had any dizziness since medications adjustment. Patient reports that she continues to have pain in her hand. Encouraged patient to call cardiology to move appointment up. Patient denies falls.  Medication Assessment: reports she has stopped Metoprolol and HCTZ   OT Update: pending home modifications  Session Summary: client doing well.  Has not been doing exercises due to concern for her heart.  Encouraged patient to call  cardiology and follow up with PCP.   Goals Addressed               This Visit's Progress     Patient Stated (pt-stated)        Aging Gracefully RN   Goal: Patient will report decrease in right knee pain in the next 120 days.  11/30/2020 Assessment:  Reviewed pain in patients right knee.  Reviewed with patient that she is taking tylenol for pain just 1 time per day. Also using Voltaren gel  Interventions: Reviewed with patient other options for pain control.  Increasing the number of times taking there tylenol, Heat and ice applications, Knee brace.  Patient reports to me that she is not interested in increasing her tylenol.  Reviewed with patient at next home visit we would review a home exercise plan to help with strength in her legs.  PLAN: next home visit planned for 12/26/2020  Rowe Pavy RN, BSN, CEN RN Case Manager for Aging Gracefully [facility] Mobile: [phone]   01/04/2021 Assessment: Denies any pain today.  Interventions: provided written exercise plan and reviewed with patient. Demonstrated how to do home exercises and patient was able to verbalize understanding. Encouraged patient to do exercises daily. Patient concerned about her heat.  Email to send to [employer] with community housing solutions Plan: next home visit scheduled for 02/15/2021.  Rowe Pavy RN, BSN, CEN RN Case Production designer, theatre/television/film for [employer] [facility] Mobile: [phone]   02/15/2021 Assessment:  Reports she continues to have hip pain and hand pain.  Reports pain level today of 2/10.  Patient reports that she is not  able to do exercises right now due to heart issues.   Interventions:  Reviewed importance of follow up with MD. Encouraged patient to call cardiology and get office visit changed to a sooner date.   Plan: Follow up in 1 month.  Rowe Pavy RN, BSN, Careers adviser for [employer] Mobile: [phone]        Rowe Pavy RN, BSN, Careers adviser for [employer] Mobile: [phone]

## 2021-02-17 ENCOUNTER — Encounter: Payer: Self-pay | Admitting: Family Medicine

## 2021-02-17 ENCOUNTER — Ambulatory Visit (INDEPENDENT_AMBULATORY_CARE_PROVIDER_SITE_OTHER): Payer: 59 | Admitting: Family Medicine

## 2021-02-17 ENCOUNTER — Other Ambulatory Visit: Payer: Self-pay

## 2021-02-17 VITALS — BP 138/80 | HR 82 | Temp 98.2°F | Ht 63.0 in | Wt 202.2 lb

## 2021-02-17 DIAGNOSIS — Z23 Encounter for immunization: Secondary | ICD-10-CM | POA: Diagnosis not present

## 2021-02-17 DIAGNOSIS — G5603 Carpal tunnel syndrome, bilateral upper limbs: Secondary | ICD-10-CM

## 2021-02-17 DIAGNOSIS — I10 Essential (primary) hypertension: Secondary | ICD-10-CM | POA: Diagnosis not present

## 2021-02-17 NOTE — Patient Instructions (Addendum)
It was nice seeing you today!  Look for a cock up wrist splint or a wrist splint specific for carpal tunnel. Wear this every night.  Follow up in 6 months or sooner if needed.  Please arrive at least 15 minutes prior to your scheduled appointments.  Stay well, Littie Deeds, MD [facility] [phone]

## 2021-02-17 NOTE — Progress Notes (Signed)
° ° °  SUBJECTIVE:   CHIEF COMPLAINT / HPI: Follow-up hand numbness and tingling  Seen about 2 weeks ago for this issue, felt to be carpal tunnel syndrome.  See chart for further details.  Patient states this has been improving with the changes in her medications though she has not yet obtained the wrist splint.  Positive flick sign.  Dizziness has resolved with since discontinuing HCTZ and metoprolol.  No further episodes since.  PERTINENT  PMH / PSH: Aortic stenosis, HTN, CKD stage III  OBJECTIVE:   BP 138/80    Pulse 82    Temp 98.2 F (36.8 C)    Ht 5\' 3"  (1.6 m)    Wt 202 lb 3.2 oz (91.7 kg)    SpO2 98%    BMI 35.82 kg/m   General: elderly female, NAD CV: RRR, 3/6 systolic murmur Pulm: CTAB, no wheezes or rales MSK: Positive Phalen sign  ASSESSMENT/PLAN:   Carpal tunnel syndrome Improving, still advised to obtain wrist splint.  HTN (hypertension) Dizziness has resolved since discontinuing HCTZ and metoprolol.  BP adequately controlled and normal heart rate today.  Continue current medications.   PCV20 given today  Zola Button, MD [place]

## 2021-02-17 NOTE — Assessment & Plan Note (Signed)
Dizziness has resolved since discontinuing HCTZ and metoprolol.  BP adequately controlled and normal heart rate today.  Continue current medications.

## 2021-02-20 NOTE — Progress Notes (Signed)
Office Visit    Patient Name: [name] Date of Encounter: 02/21/2021  PCP:  Precious Gilding, [facility]  Cardiologist:  Mertie Moores, MD  Advanced Practice Provider:  Liliane Shi, PA-C Electrophysiologist:  None      Chief Complaint    [name] is a 88 y.o. female with a hx of mild aortic stenosis, CKDIII, HTN, GI bleed, LBBB, CAD presents today for elevated blood pressure   Past Medical History    Past Medical History:  Diagnosis Date   Acid reflux    takes Nexium daily   Anemia    takes Ferrous Sulfate daily   Aortic stenosis    mild AS pk grad 18, mean grad 9, AVA 1.32 cm 03/2015 echo (Dr. Einar Gip)   Arthritis    Chronic kidney disease (CKD), stage III (moderate) ([place])    Diverticulitis    Hematochezia 03/2015   High cholesterol    can't take the meds   History of blood transfusion 03/2015   no abnormal reaction noted   History of GI diverticular bleed    HTN (hypertension)    takes Losartan-HCTZ and Metoprolol daily   Joint pain    Joint swelling    Nocturia    Numbness    in legs   Peripheral edema    occasionally   Shortness of breath dyspnea    occasionally and with exertion   Slow urinary stream    at times   Past Surgical History:  Procedure Laterality Date   ABCESS DRAINAGE     abdominal abcess   cataract surgery Bilateral    CHOLECYSTECTOMY     DILATION AND CURETTAGE OF UTERUS     ESOPHAGOGASTRODUODENOSCOPY N/A 02/23/2015   Procedure: ESOPHAGOGASTRODUODENOSCOPY (EGD);  Surgeon: Laurence Spates, MD;  Location: [facility] ENDOSCOPY;  Service: Endoscopy;  Laterality: N/A;   FLEXIBLE SIGMOIDOSCOPY N/A 02/24/2015   Procedure: FLEXIBLE SIGMOIDOSCOPY;  Surgeon: Laurence Spates, MD;  Location: [place];  Service: Endoscopy;  Laterality: N/A;   IR ANGIOGRAM SELECTIVE EACH ADDITIONAL VESSEL  03/06/2017   IR ANGIOGRAM SELECTIVE EACH ADDITIONAL VESSEL  03/06/2017   IR ANGIOGRAM SELECTIVE EACH ADDITIONAL VESSEL  03/06/2017    IR ANGIOGRAM SELECTIVE EACH ADDITIONAL VESSEL  03/08/2017   IR ANGIOGRAM VISCERAL SELECTIVE  03/06/2017   IR ANGIOGRAM VISCERAL SELECTIVE  03/06/2017   IR ANGIOGRAM VISCERAL SELECTIVE  03/06/2017   IR ANGIOGRAM VISCERAL SELECTIVE  03/08/2017   IR ANGIOGRAM VISCERAL SELECTIVE  03/08/2017   IR EMBO ART  VEN HEMORR LYMPH EXTRAV  INC GUIDE ROADMAPPING  03/06/2017   IR EMBO ART  VEN HEMORR LYMPH EXTRAV  INC GUIDE ROADMAPPING  03/08/2017   IR US GUIDE [place] RIGHT  03/06/2017   IR US GUIDE VASC ACCESS RIGHT  03/08/2017   left knee surgery     TOTAL HIP ARTHROPLASTY Left 05/17/2015   Procedure: LEFT TOTAL HIP ARTHROPLASTY ANTERIOR APPROACH;  Surgeon: Mcarthur Rossetti, MD;  Location: [place];  Service: Orthopedics;  Laterality: Left;   Allergies  Allergies  Allergen Reactions   Ezetimibe Anaphylaxis   Lipitor [Atorvastatin] Swelling   Pravachol [Pravastatin] Swelling   Statins Swelling and Other (See Comments)    Swelling of mouth and lips   Zocor [Simvastatin] Swelling   Cholestyramine Nausea And Vomiting   History of Present Illness    [name] is a 88 y.o. female with a hx of mild aortic stenosis, CKDIII, HTN, GI bleed, LBBB, CAD  last seen 10/24/20 by Dr. Acie Fredrickson.  Coronary artery disease dates back to cardiac catheterization in 2010 which showed nonobstructive disease.  She was admitted 09/2017 with chest for medical management due to age and kidney disease.  She had mild aortic stenosis with mean gradient of 11 mmHg by echo 09/2017.  Carotid duplex 09/2018 with bilateral less than 9% stenosis.  Echo 09/2019 LVEF 60 to 29%, grade 1 diastolic dysfunction, mild aortic stenosis with mean gradient 13.2 mmHg.  Previous long-term monitor 09/2019 showed sinus rhythm with 4 beat run of slow ventricular tachycardia rate 94 bpm which was asymptomatic.  She was last seen 10/2020 by Dr. Meda Coffee.  Due to first-degree AV block her metoprolol was reduced from 25 to 12.5 mg twice daily.  She was recommended to  follow-up in 6 months.  Seen by primary care 02/17/21 due to carpal tunnel which was improved with wrist splint. Dizziness had resolved since discontinuation of HCTZ and Metoprolol.  She presents today for follow up. Notes no recurrent lightheadedness since discontinuation of HCTZ and Metoprolol. Her Bp cuff at home is a wrist cuff and reports BP elevated at home. We discussed that her wrist cuff is likely inaccurate. Reports mild bilateral ankle edema. She does sit with her legs down and encouraged elevation, compression stockings. She lives independently and is planning to start working with home health physical therapy for deconditioning. She reports no chest pain. She has exertional dyspnea which is stable at her baseline. She does most of her cooking at home and tries to eat low salt but often eats more homestyle cooking.   EKGs/Labs/Other Studies Reviewed:   The following studies were reviewed today:  LONG TERM MONITOR (8-14 DAYS) INTERPRETATION 11/12/2019 Narrative  Sinus rhythm including NSR and sinus bradycardia  Slowest HR recorded was 34 at 2:48 PMnon Sept. 21, 2021  She had a 4 beat run of slow ventricular tachycardia ( HR of 104 ) which was not symptomatic   Carotid US 09/18/2019 Bilateral ICA 1-39   Echocardiogram 09/18/2019 EF 60-65, normal wall motion, mild LVH, GR 1 DD normal RVSF, RVSP 52.6 (moderately elevated), moderate LAE, mild RAE, mild MR, mild aortic stenosis (mean gradient 13.2 mmHg)   Cardiac monitor 08/2018 Sinus rhythm, sinus bradycardia, rare PVCs   Echocardiogram 09/30/2017 EF 55-60, normal wall motion, grade 2 diastolic dysfunction, mild aortic stenosis (mean 11), MAC, moderate LAE, mild RAE, mild TR, PASP 33   Cardiac catheterization 01/08/2009 EF 55-60 LAD proximal 15-20, mid 30-40; D2 ostial 20 LCx proximal 20-25, distal 40-50 RCA proximal 20-25, mid 50-55, distal 20-25   EKG:  No EKG today  Recent Labs: 08/02/2020: Hemoglobin 11.4; NT-Pro BNP 611;  Platelets 253 09/22/2020: BUN 24; Creatinine, Ser 1.57; Potassium 5.2; Sodium 142  Recent Lipid Panel    Component Value Date/Time   CHOL (H) 01/06/2009 0149    209        ATP III CLASSIFICATION:  <200     mg/dL   Desirable  200-239  mg/dL   Borderline High  >=240    mg/dL   High          TRIG 51 01/06/2009 0149   HDL 56 01/06/2009 0149   CHOLHDL 3.7 01/06/2009 0149   VLDL 10 01/06/2009 0149   LDLCALC (H) 01/06/2009 0149    143        Total Cholesterol/HDL:CHD Risk Coronary Heart Disease Risk Table  Men   Women  1/2 Average Risk   3.4   3.3  Average Risk       5.0   4.4  2 X Average Risk   9.6   7.1  3 X Average Risk  23.4   11.0        Use the calculated Patient Ratio above and the CHD Risk Table to determine the patient's CHD Risk.        ATP III CLASSIFICATION (LDL):  <100     mg/dL   Optimal  100-129  mg/dL   Near or Above                    Optimal  130-159  mg/dL   Borderline  160-189  mg/dL   High  >190     mg/dL   Very High   Home Medications   Current Meds  Medication Sig   acetaminophen (TYLENOL) 650 MG CR tablet Take 650 mg by mouth daily.   amLODipine (NORVASC) 5 MG tablet Take 1 tablet by mouth once daily   Cholecalciferol (VITAMIN D3) 2000 units TABS Take 2,000 Units by mouth daily.   diclofenac sodium (VOLTAREN) 1 % GEL Apply 2 g topically 2 (two) times daily as needed (pain).    famotidine (PEPCID) 20 MG tablet Take 1 tablet by mouth once daily   Ferrous Sulfate 27 MG TABS Take 1 tablet by mouth daily.   furosemide (LASIX) 20 MG tablet TAKE 1 TABLET BY MOUTH AS NEEDED FOR EDEMA (Patient taking differently: 20 mg daily.)   isosorbide mononitrate (IMDUR) 120 MG 24 hr tablet Take 1 tablet by mouth once daily   lidocaine (LIDODERM) 5 % Place 1 patch onto the skin daily. Remove & Discard patch within 12 hours or as directed by MD   Multiple Vitamin (MULITIVITAMIN WITH MINERALS) TABS Take 1 tablet by mouth daily.   nitroGLYCERIN  (NITROSTAT) 0.4 MG SL tablet Place 0.4 mg under the tongue every 5 (five) minutes as needed for chest pain.    polyvinyl alcohol (LIQUIFILM TEARS) 1.4 % ophthalmic solution Place 1 drop into both eyes as needed for dry eyes.   PROAIR HFA 108 (90 Base) MCG/ACT inhaler Inhale 1-2 puffs into the lungs every 4 (four) hours as needed for shortness of breath or wheezing.   [DISCONTINUED] hydrochlorothiazide (MICROZIDE) 12.5 MG capsule Take 1 capsule by mouth once daily   [DISCONTINUED] metoprolol tartrate (LOPRESSOR) 25 MG tablet Take 0.5 tablets (12.5 mg total) by mouth 2 (two) times daily. (Patient taking differently: Take 25 mg by mouth daily.)     Review of Systems      All other systems reviewed and are otherwise negative except as noted above.  Physical Exam    VS:  BP 124/62 (BP Location: Left Arm, Patient Position: Sitting, Cuff Size: Normal)    Pulse 94    Ht _0  (1.6 m)    Wt 200 lb (90.7 kg)    BMI 35.43 kg/m  , BMI Body mass index is 35.43 kg/m.  Wt Readings from Last 3 Encounters:  02/21/21 200 lb (90.7 kg)  02/17/21 202 lb 3.2 oz (91.7 kg)  02/07/21 197 lb 2 oz (89.4 kg)     GEN: Well nourished, overweight, well developed, in no acute distress. HEENT: normal. Neck: Supple, no JVD, carotid bruits, or masses. Cardiac: RRR, no murmurs, rubs, or gallops. No clubbing, cyanosis. Bilateral nonpitting pedal edema..  Radials/PT 2+ and equal bilaterally.  Respiratory:  Respirations  regular and unlabored, clear to auscultation bilaterally. GI: Soft, nontender, nondistended. MS: No deformity or atrophy. Skin: Warm and dry, no rash. Neuro:  Strength and sensation are intact. Psych: Normal affect.  Assessment & Plan    Hypertension - BP at goal of <130/80. No hypotension since discontinuation of HCTZ and Metoprolol by PCP. Home wrist cuff inaccurate, recommend purchase of arm cuff. SBP goal 110-130. If recurrent hypotension, plan to reduce or discontinue Amlodipine. Heart healthy diet  and regular cardiovascular exercise encouraged.    Mild aortic stenosis - Stable by echo 09/18/19. No chest pain nor syncope. No indication for repeat echo at this time.   First-degree AV block / LBBB - Stable finding by EKG she brings for review from EMS visit 12/2020. No recurrent lightheadedness, dizziness, near syncope, syncope since optimization of BP.   Carpal tunnel - Recommend wrist splint.  Deconditioning - Likely etiology of dyspnea. Encouraged to start exercise routine. She is planning to start working with [facility] PT.   LE edema - Bilateral nonpitting pedal edema. Likely venous insufficiency. Continue lasix 77m QD. Recommend compression stockings and elevation of lower extremities.   Disposition: Follow up in 4 month(s) with PMertie Moores MD or APP.  Signed, CLoel Dubonnet NP 02/21/2021, 7:30 PM [place]

## 2021-02-21 ENCOUNTER — Encounter (HOSPITAL_BASED_OUTPATIENT_CLINIC_OR_DEPARTMENT_OTHER): Payer: Self-pay | Admitting: Family

## 2021-02-21 ENCOUNTER — Other Ambulatory Visit: Payer: Self-pay

## 2021-02-21 ENCOUNTER — Ambulatory Visit (INDEPENDENT_AMBULATORY_CARE_PROVIDER_SITE_OTHER): Payer: 59 | Admitting: Family

## 2021-02-21 VITALS — BP 124/62 | HR 94 | Ht 63.0 in | Wt 200.0 lb

## 2021-02-21 DIAGNOSIS — R29898 Other symptoms and signs involving the musculoskeletal system: Secondary | ICD-10-CM

## 2021-02-21 DIAGNOSIS — I10 Essential (primary) hypertension: Secondary | ICD-10-CM

## 2021-02-21 DIAGNOSIS — R600 Localized edema: Secondary | ICD-10-CM

## 2021-02-21 DIAGNOSIS — I440 Atrioventricular block, first degree: Secondary | ICD-10-CM | POA: Diagnosis not present

## 2021-02-21 DIAGNOSIS — I350 Nonrheumatic aortic (valve) stenosis: Secondary | ICD-10-CM

## 2021-02-21 NOTE — Patient Instructions (Addendum)
Medication Instructions:  Continue your current medications.   *If you need a refill on your cardiac medications before your next appointment, please call your pharmacy*   Lab Work: None ordered today.   Testing/Procedures: None ordered today.   Follow-Up: At [facility], you and your health needs are our priority.  As part of our continuing mission to provide you with exceptional heart care, we have created designated Provider Care Teams.  These Care Teams include your primary Cardiologist (physician) and Advanced Practice Providers (APPs -  Physician Assistants and Nurse Practitioners) who all work together to provide you with the care you need, when you need it.  We recommend signing up for the patient portal called "MyChart".  Sign up information is provided on this After Visit Summary.  MyChart is used to connect with patients for Virtual Visits (Telemedicine).  Patients are able to view lab/test results, encounter notes, upcoming appointments, etc.  Non-urgent messages can be sent to your provider as well.   To learn more about what you can do with MyChart, go to [email or web address].    Your next appointment:   May 8th, 2023 at 3:35PM with Tereso Newcomer, PA at the [facility].   Other Instructions   Your EKG that EMS did in November showed normal sinus rhythm with left bundle branch block. This is stable compared to your previous.   Your blood pressure looked great in clinic today! Stay off of Hydrochlorothiazide and Metoprolol. Your wrist cuff at home is likely inaccurately giving you high readings.   You are appropriate to start physical therapy.   To prevent or reduce lower extremity swelling: Eat a low salt diet. Salt makes the body hold onto extra fluid which causes swelling. Sit with legs elevated. For example, in the recliner or on an ottoman.  Wear knee-high compression stockings during the daytime. Ones labeled 15-20 mmHg provide good  compression.   You can purchase compression stockings online or at: [facility]   [address]   [phone]

## 2021-03-03 ENCOUNTER — Other Ambulatory Visit: Payer: Self-pay | Admitting: Student

## 2021-03-07 ENCOUNTER — Other Ambulatory Visit: Payer: Self-pay | Admitting: Student

## 2021-03-29 ENCOUNTER — Other Ambulatory Visit: Payer: Self-pay

## 2021-03-30 NOTE — Patient Instructions (Signed)
Goals Addressed               This Visit's Progress     COMPLETED: Patient Stated (pt-stated)        Aging Gracefully RN   Goal: Patient will report decrease in right knee pain in the next 120 days.  11/30/2020 Assessment:  Reviewed pain in patients right knee.  Reviewed with patient that she is taking tylenol for pain just 1 time per day. Also using Voltaren gel  Interventions: Reviewed with patient other options for pain control.  Increasing the number of times taking there tylenol, Heat and ice applications, Knee brace.  Patient reports to me that she is not interested in increasing her tylenol.  Reviewed with patient at next home visit we would review a home exercise plan to help with strength in her legs.  PLAN: next home visit planned for 12/26/2020  Tomasa Rand RN, BSN, CEN RN Case Manager for [employer] Mobile: [phone]   01/04/2021 Assessment: Denies any pain today.  Interventions: provided written exercise plan and reviewed with patient. Demonstrated how to do home exercises and patient was able to verbalize understanding. Encouraged patient to do exercises daily. Patient concerned about her heat.  Email to send to [employer] with community housing solutions Plan: next home visit scheduled for 02/15/2021.  Tomasa Rand RN, BSN, CEN RN Case Freight forwarder for [employer] Mobile: [phone]   02/15/2021 Assessment:  Reports she continues to have hip pain and hand pain.  Reports pain level today of 2/10.  Patient reports that she is not able to do exercises right now due to heart issues.   Interventions:  Reviewed importance of follow up with MD. Encouraged patient to call cardiology and get office visit changed to a sooner date.   Plan: Follow up in 1 month.  Tomasa Rand RN, BSN, CEN RN Case Freight forwarder for [place] Mobile: [phone]   03/29/2021 Assessment: Continues to  report pain in her knees and hands. Reports she has good day and bad days. Reports she is not doing her home exercises.   Interventions: encouraged patient to work on her exercise plan, Encouraged patient to talk to her MD about her pain. Reviewed heat and cold therapy.  PLAN: last home visit. Goal Completed.  Notified OT of completion of RN home visits.  Tomasa Rand RN, BSN, Careers information officer for [employer] Mobile: [phone]

## 2021-03-30 NOTE — Patient Outreach (Signed)
Aging [facility]fully Program  RN Visit  03/30/2021  ELFRIEDE BONINI 21-May-1923 [id number]  Visit:   RN home visit #4  Start Time:   1125 End Time:   1200 Total Minutes:   35  Readiness To Change Score:     Universal RN Interventions: Calendar Distribution: Yes Exercise Review: Yes Medications: Yes Medication Changes: Yes Mood: Yes Pain: Yes PCP Advocacy/Support: Yes Fall Prevention: Yes Incontinence: Yes Clinician View Of Client Situation: Able to see the house from the road with both trees cut down. Home is well lit today with the blinds open.  Patient opened door using her cane.   Renae Fickle with [employer] working in the home.  New walk in shower looks great. Client View Of His/Her Situation: Patient reports no new changes to medications since last home visit. Reports that she is doing well. Reports she has not been doing her exercises. Reports that she continues to have pain in her knees.  Denies any recent falls or hospitalizations.  Reports she continues to Syona Wroblewski for herself. Reports that she is eating well and sleeping well.  Healthcare Provider Communication: Did Surveyor, mining With [employer] Provider?: No According to Client, Did PCP Report Communication With An Aging Gracefully RN?: No  Clinician View of Client Situation: Clinician View Of Client Situation: Able to see the house from the road with both trees cut down. Home is well lit today with the blinds open.  Patient opened door using her cane.   Renae Fickle with [employer] working in the home.  New walk in shower looks great. Client's View of His/Her Situation: Client View Of His/Her Situation: Patient reports no new changes to medications since last home visit. Reports that she is doing well. Reports she has not been doing her exercises. Reports that she continues to have pain in her knees.  Denies any recent falls or hospitalizations.  Reports she continues to Leatta Alewine for herself. Reports that she  is eating well and sleeping well.  Medication Assessment: denies any changes to medications    OT Update: Pending completion of home modifications  Session Summary: doing well. Continues to have chronic pain.   Goals Addressed               This Visit's Progress     COMPLETED: Patient Stated (pt-stated)        Aging Gracefully RN   Goal: Patient will report decrease in right knee pain in the next 120 days.  11/30/2020 Assessment:  Reviewed pain in patients right knee.  Reviewed with patient that she is taking tylenol for pain just 1 time per day. Also using Voltaren gel  Interventions: Reviewed with patient other options for pain control.  Increasing the number of times taking there tylenol, Heat and ice applications, Knee brace.  Patient reports to me that she is not interested in increasing her tylenol.  Reviewed with patient at next home visit we would review a home exercise plan to help with strength in her legs.  PLAN: next home visit planned for 12/26/2020  Rowe Pavy RN, BSN, CEN RN Case Manager for Aging [facility] Mobile: [phone]   01/04/2021 Assessment: Denies any pain today.  Interventions: provided written exercise plan and reviewed with patient. Demonstrated how to do home exercises and patient was able to verbalize understanding. Encouraged patient to do exercises daily. Patient concerned about her heat.  Email to send to [employer] with community housing solutions Plan: next home visit scheduled for  02/15/2021.  Rowe Pavy RN, BSN, CEN RN Case Production designer, theatre/television/film for [employer] [facility] Mobile: [phone]   02/15/2021 Assessment:  Reports she continues to have hip pain and hand pain.  Reports pain level today of 2/10.  Patient reports that she is not able to do exercises right now due to heart issues.   Interventions:  Reviewed importance of follow up with MD. Encouraged patient to call cardiology and get office  visit changed to a sooner date.   Plan: Follow up in 1 month.  Rowe Pavy RN, BSN, CEN RN Case Production designer, theatre/television/film for [employer] [facility] Mobile: [phone]   03/29/2021 Assessment: Continues to report pain in her knees and hands. Reports she has good day and bad days. Reports she is not doing her home exercises.   Interventions: encouraged patient to work on her exercise plan, Encouraged patient to talk to her MD about her pain. Reviewed heat and cold therapy.  PLAN: last home visit. Goal Completed.  Notified OT of completion of RN home visits.  Rowe Pavy RN, BSN, Careers adviser for [employer] Mobile: [phone]        Rowe Pavy RN, BSN, Careers adviser for [employer] Mobile: [phone]

## 2021-04-08 ENCOUNTER — Other Ambulatory Visit: Payer: Self-pay

## 2021-04-08 ENCOUNTER — Other Ambulatory Visit: Payer: 59 | Admitting: Occupational Therapy

## 2021-04-08 NOTE — Patient Outreach (Signed)
Aging Gracefully Program  OT Follow-Up Visit  04/08/2021  [name] 13-Jun-1923 [id number]  Visit:  2- Second Visit  Start Time:  1600 End Time:  1625 Total Minutes:  25  Readiness to Change Score :  Readiness to Change Score: 10   Durable Medical Equipment: Durable Medical Equipment: Shower Chair With Back Durable Medical Equipment Distribution Date: 04/08/21   Goals:   Goals Addressed             This Visit's Progress    COMPLETED: Patient Stated       She would like to safer bathing. A walk in shower, new shower seat, grab bars, and hand held shower would help with this.  ACTION PLANNING - BATHING Target Problem Area: Decreased safety with showering  Why Problem May Occur: Decreased balance  Target Goal: Safety in new walk-in shower   STRATEGIES Saving Your Energy: DO:  DON'T:  Use a shower seat Stand while bathing, it uses more energy  Use handheld shower Rush  Keep all items you'll need within easy reach   Modifying your home environment and making it safe: DO: DON'T:  Use grab bars for safety with getting in and out of shower stall.   Good to use a non-skid mat in shower stall where feet will be. Place loose rugs in the bathroom- they can trip you or your walker/cane can get caught on them  Make sure the bathroom is well lit    Simplifying the way you set up tasks or daily routines: DO: DON'T:  Plan to bathe/shower before you're overly tired Rush through [employer] all items before getting started    Practice It is important to practice the strategies so we can determine if they will be effective in helping to reach your goal. Follow these specific recommendations: 1. Use shower seat to sit on in shower. 2.Use grab bars to help you step into and out of shower stall 3. Place nonskid mat on floor of shower stall in front of seat.  If a strategy does not work the first time, try it again and again (and maybe again). We may make  some changes over the next few sessions, based on how they work.  Ignacia Palma, OTR/L      04/08/2021        Post Clinical Reasoning: Client Action (Goal) One Interventions: Pt provided with shower seat with back Did Client Try?: Yes Targeted Problem Area Status: A Lot Better Clinician View Of Client Situation:: [name] continues to do well for herself at the age of 26. Her knees where bothering her today ("they do that when the weather is cold and rainy"). She has enjoyed her new ramp and walk in shower. Client View Of His/Her Situation:: [name] is happy with her new shower seat (I set it to the best height for her and she tried it out to make sure). She is happy with all the modifications and renovations at her house. She has mixed emotions about her trees being gone in the front yard (will miss the shade, but glad all the leaves and other items will not be on the ground anymore to need to be gotten up). Next Visit Plan:: Getting up from a fall and safety checklist at home  Ignacia Palma, OTR/L Aging [facility]fully [phone]

## 2021-04-08 NOTE — Patient Instructions (Signed)
She would like to safer bathing. A walk in shower, new shower seat, grab bars, and hand held shower would help with this.  ACTION PLANNING - BATHING Target Problem Area: Decreased safety with showering  Why Problem May Occur: Decreased balance  Target Goal: Safety in new walk-in shower   STRATEGIES Saving Your Energy: DO:  DON'T:  Use a shower seat Stand while bathing, it uses more energy  Use handheld shower Rush  Keep all items you'll need within easy reach    Modifying your home environment and making it safe: DO: DON'T:  Use grab bars for safety with getting in and out of shower stall.   Good to use a non-skid mat in shower stall where feet will be. Place loose rugs in the bathroom- they can trip you or your walker/cane can get caught on them  Make sure the bathroom is well lit     Simplifying the way you set up tasks or daily routines: DO: DON'T:  Plan to bathe/shower before you're overly tired Rush through [employer] all items before getting started    Practice It is important to practice the strategies so we can determine if they will be effective in helping to reach your goal. Follow these specific recommendations: 1. Use shower seat to sit on in shower. 2.Use grab bars to help you step into and out of shower stall 3. Place nonskid mat on floor of shower stall in front of seat.  If a strategy does not work the first time, try it again and again (and maybe again). We may make some changes over the next few sessions, based on how they work.  [name], OTR/L      04/08/2021

## 2021-04-12 ENCOUNTER — Ambulatory Visit: Payer: 59 | Admitting: Physician Assistant

## 2021-04-19 ENCOUNTER — Other Ambulatory Visit: Payer: Self-pay | Admitting: Student

## 2021-04-20 ENCOUNTER — Other Ambulatory Visit: Payer: Self-pay

## 2021-04-20 ENCOUNTER — Ambulatory Visit (INDEPENDENT_AMBULATORY_CARE_PROVIDER_SITE_OTHER): Payer: 59 | Admitting: Student

## 2021-04-20 ENCOUNTER — Encounter: Payer: Self-pay | Admitting: Student

## 2021-04-20 VITALS — BP 134/74 | HR 85 | Wt 195.8 lb

## 2021-04-20 DIAGNOSIS — N183 Chronic kidney disease, stage 3 unspecified: Secondary | ICD-10-CM | POA: Diagnosis not present

## 2021-04-20 DIAGNOSIS — K921 Melena: Secondary | ICD-10-CM | POA: Insufficient documentation

## 2021-04-20 DIAGNOSIS — K5790 Diverticulosis of intestine, part unspecified, without perforation or abscess without bleeding: Secondary | ICD-10-CM | POA: Diagnosis not present

## 2021-04-20 HISTORY — DX: Melena: K92.1

## 2021-04-20 NOTE — Assessment & Plan Note (Signed)
Likely due to diverticulosis, patient has history of GI bleed due to diverticulosis.  During last colonoscopy, they were unable to get past the sigmoid colon but significant diverticulosis was noted and also noted on CT virtual colonoscopy.  Patient states she was told she would not have another colonoscopy in the future. ?-CBC to monitor hemoglobin ?-Continue taking iron supplement ?

## 2021-04-20 NOTE — Patient Instructions (Addendum)
It was great to see you! Thank you for allowing me to participate in your care! ? ?I recommend that you always bring your medications to each appointment as this makes it easy to ensure you are on the correct medications and helps [place] not miss when refills are needed. ? ?Our plans for today:  ?-Use wrist splints at night time for your carpal tunnel ?-You can take up to 2 tablets of 500 mg Tylenol every 6 hours for pain ?-You can use the Voltaren gel up to 4 times per day ?-Return in a few weeks to reevaluate knee pain ? ? ?We are checking some labs today, I will call you if they are abnormal will send you a MyChart message or a letter if they are normal.  If you do not hear about your labs in the next 2 weeks please let us know. ? ?Take care and seek immediate care sooner if you develop any concerns.  ? ?Dr. Precious Gilding, DO ?[facility] ? ?

## 2021-04-20 NOTE — Progress Notes (Signed)
? ? ?  SUBJECTIVE:  ? ?CHIEF COMPLAINT / HPI:  ? ?Carpal tunnel ?Patient has been seen for symptoms likely related to carpal tunnel for several months.  Was seen on 02/07/2021 by Dr. Wynelle Cleveland who recommended wearing wrist splints at night.  Patient was then seen by Dr. Nancy Fetter on 02/17/2021 who also advised patient to obtain wrist splints. Pt got a right hand splint at [place] which she has helped, wears it during the days, not as much at night. ? ?Osteoarthritis ?Right knee pain likely due to osteoarthritis. Has been using Voltaren gel sometimes twice a day. Takes tylenol 650 mg every morning. Pt is asking about steroid injections. Has done physical therapy before but transportation is an issue.  ? ?Blood in stool   Diverticulosis ?Pt has has had dark stools and water turns pink after BM. Denies any abdominal pain. Sometimes has to strain. Last colonoscopy in 2017, unable to pass sigmoid but diverticulosis present. CT virtual colonoscopy showed diffuse diverticulosis.  ? ?PERTINENT  PMH / PSH: CKD stage III, HTN, GERD, osteoarthritis, diverticulosis ? ?OBJECTIVE:  ? ?Vitals:  ? 04/20/21 1415 04/20/21 1451  ?BP: (!) 153/75 134/74  ?Pulse: 85   ?SpO2: 99%   ?Repeat BP 134/74 ? ? ? ?General: NAD, pleasant, able to participate in exam ?Cardiac: RRR, systolic murmur present ?Respiratory: CTAB, normal effort ?Abdomen: Bowel sounds present, no tenderness to palpation, nondistended, soft ?MSK: Mild edema of right knee, no erythema or warmth, some tenderness to palpation at top of patella, 5/5 muscle strength of knee flexion and extension ?Neuro: alert, no obvious focal deficits ?Psych: Normal affect and mood ? ?ASSESSMENT/PLAN:  ? ?Carpal tunnel ?Left and right wrist splints were purchased at clinic today ?-Wear wrist splints while sleeping ? ?Osteoarthritis ?-Tylenol up to 1 g every 6 hours as needed ?-Voltaren gel up to 4 times a day ?-Can try heating pad or ice if other provide relief ?-Follow-up in the next few weeks for  possible steroid injection ? ?Blood in stool ?Likely due to diverticulosis, patient has history of GI bleed due to diverticulosis.  During last colonoscopy, they were unable to get past the sigmoid colon but significant diverticulosis was noted and also noted on CT virtual colonoscopy.  Patient states she was told she would not have another colonoscopy in the future. ?-CBC to monitor hemoglobin ?-Continue taking iron supplement ? ? ?Dr. Precious Gilding, DO ?[place]  ? ? ? ? ?

## 2021-04-21 LAB — BASIC METABOLIC PANEL
BUN/Creatinine Ratio: 13 (ref 12–28)
BUN: 18 mg/dL (ref 10–36)
CO2: 23 mmol/L (ref 20–29)
Calcium: 10.1 mg/dL (ref 8.7–10.3)
Chloride: 103 mmol/L (ref 96–106)
Creatinine, Ser: 1.43 mg/dL — ABNORMAL HIGH (ref 0.57–1.00)
Glucose: 96 mg/dL (ref 70–99)
Potassium: 5 mmol/L (ref 3.5–5.2)
Sodium: 145 mmol/L — ABNORMAL HIGH (ref 134–144)
eGFR: 34 mL/min/{1.73_m2} — ABNORMAL LOW (ref >59)

## 2021-04-21 LAB — CBC
Hematocrit: 34.9 % (ref 34.0–46.6)
Hemoglobin: 11.7 g/dL (ref 11.1–15.9)
MCH: 30.6 pg (ref 26.6–33.0)
MCHC: 33.5 g/dL (ref 31.5–35.7)
MCV: 91 fL (ref 79–97)
Platelets: 305 10*3/uL (ref 150–450)
RBC: 3.82 x10E6/uL (ref 3.77–5.28)
RDW: 13 % (ref 11.7–15.4)
WBC: 2.9 10*3/uL — ABNORMAL LOW (ref 3.4–10.8)

## 2021-04-24 ENCOUNTER — Encounter: Payer: Self-pay | Admitting: Student

## 2021-04-24 NOTE — Progress Notes (Signed)
Letter with lab results from 3/10 sent ?

## 2021-05-11 NOTE — Progress Notes (Signed)
? ? ?  SUBJECTIVE:  ? ?CHIEF COMPLAINT / HPI:  ? ?Carpal tunnel ?Wrist splints purchased at last visit. Pt states they help a little but is still having nerve pain. Wears them most nights. Bought OTC supplment called [employer] (alpha-lipoic acid, B complex vitamins, tumeric and ginger).  ? ?Osteoarthritis ?Has been using Tylenol 500g BID, lidocaine patched and Voltaren gel. Pain has not improved since last visit.  ? ?Acid reflux ?Wakes up at night to urinate, heart is pounding, drinks water and feels like something is coming up in her throat and burns a little with burping. Burping in office today.  ? ? ? ?OBJECTIVE:  ? ?Vitals:  ? 05/12/21 1443  ?BP: 127/80  ?Pulse: 94  ?Temp: 98.5 ?F (36.9 ?C)  ?SpO2: 99%  ? ? ? ?General: NAD, pleasant, able to participate in exam ?Cardiac: RRR, systolic murmur present ?Respiratory: CTAB, normal effort, No wheezes, rales or rhonchi ?Abdomen: Bowel sounds present, nontender, nondistended ?Extremities: Mild edema of right knee, no erythema or warmth, some tenderness to palpation at top of patella ?Neuro: alert, no obvious focal deficits ?Psych: Normal affect and mood ? ?ASSESSMENT/PLAN:  ? ?Carpal tunnel ?-Can take OTC supplement if she would like ?-Continue wearing wrist braces especially at night ?  ?Acid reflux ?-increase famotidine to 20 mg BID ? ?Osteoarthritis ?-Tylenol 1000 mg q6h prn ?- Voltaren gel and lidocaine patches prn ?-return if she would like steroid injections ? ?Dr. Precious Gilding, DO ?[place]  ? ? ? ? ?

## 2021-05-12 ENCOUNTER — Encounter: Payer: Self-pay | Admitting: Student

## 2021-05-12 ENCOUNTER — Ambulatory Visit (INDEPENDENT_AMBULATORY_CARE_PROVIDER_SITE_OTHER): Payer: 59 | Admitting: Student

## 2021-05-12 VITALS — BP 127/80 | HR 94 | Temp 98.5°F | Wt 196.2 lb

## 2021-05-12 DIAGNOSIS — M1711 Unilateral primary osteoarthritis, right knee: Secondary | ICD-10-CM | POA: Diagnosis not present

## 2021-05-12 DIAGNOSIS — K219 Gastro-esophageal reflux disease without esophagitis: Secondary | ICD-10-CM | POA: Diagnosis not present

## 2021-05-12 DIAGNOSIS — G5603 Carpal tunnel syndrome, bilateral upper limbs: Secondary | ICD-10-CM | POA: Diagnosis not present

## 2021-05-12 MED ORDER — FAMOTIDINE 20 MG PO TABS: 20.0000 mg | ORAL_TABLET | Freq: Two times a day (BID) | ORAL | 3 refills | Status: DC

## 2021-05-12 NOTE — Patient Instructions (Signed)
It was great to see you! Thank you for allowing me to participate in your care! ? ?I recommend that you always bring your medications to each appointment as this makes it easy to ensure you are on the correct medications and helps [place] not miss when refills are needed. ? ?Our plans for today:  ?- You can take tylenol 1000 mg (two 500 mg tablets) every 6 hours as needed for pain ?- Continue using volatren gel and lidocaine patches ?-Return for steroid injections if you would like for your knee pain ?-Increase your famotidine to twice a day  ?- If you do not return for steroid injections, please return in about 4 months for a follow up visit  ? ?Take care and seek immediate care sooner if you develop any concerns.  ? ?Dr. Erick Alley, DO ?[facility] ? ?

## 2021-05-22 ENCOUNTER — Telehealth: Payer: Self-pay

## 2021-05-22 NOTE — Telephone Encounter (Signed)
Patient calls nurse line reporting she increased Pepcid to BID from once daily. Patient reports two days after the increase she began to have symptoms of chest tightness, cough and SOB. Patient feels these symptoms are due to the medication increase. Patient reports she stopped taking it all together and began to feel better over the weekend.  ? ?Patient would like PCP opinion on dosage increase. Patient reports she is going to start taking one daily today, however she does not feel comfortable taking BID.  ? ?Patient denies any present symptoms today.  ? ?Will forward to PCP.  ?

## 2021-05-23 ENCOUNTER — Telehealth: Payer: Self-pay | Admitting: Student

## 2021-05-23 NOTE — Telephone Encounter (Signed)
Called patient after I received a message from the nurse about patient complaining of chest tightness and shortness of breath after going up on her Pepcid to BID.  Patient said couple of days ago she was having chest tightness, shortness of breath, nasal discharge and tearing shortly after going up on her Pepcid.  She reports her symptoms have since improved. She also uses inhaler due to SOB and wheezing in the past.  She said the inhaler has helped her with chest tightness and breathing in the past.  Informed patient that her presentation is possibly allergy or medication side effect.  Encouraged patient to continue Pepcid once daily and to go back up to twice daily once she is feeling much better. Return precaution discussed with patient if symptoms reoccur.  Informed patient if she continues to have shortness of breath or develops wheezing, she can use her inhaler and if symptoms persist with chest pain or SOB she should go to the ED. Patient verbalized understanding and is agreeable to plan. ?

## 2021-05-29 ENCOUNTER — Encounter (HOSPITAL_COMMUNITY): Payer: Self-pay

## 2021-05-29 ENCOUNTER — Emergency Department (HOSPITAL_COMMUNITY)
Admission: EM | Admit: 2021-05-29 | Discharge: 2021-05-30 | Disposition: A | Discharge status: Home / Self Care | Payer: 59 | Attending: Emergency Medicine | Admitting: Emergency Medicine

## 2021-05-29 ENCOUNTER — Emergency Department (HOSPITAL_COMMUNITY): Payer: 59

## 2021-05-29 DIAGNOSIS — K625 Hemorrhage of anus and rectum: Secondary | ICD-10-CM | POA: Diagnosis present

## 2021-05-29 DIAGNOSIS — R040 Epistaxis: Secondary | ICD-10-CM | POA: Diagnosis not present

## 2021-05-29 DIAGNOSIS — J3489 Other specified disorders of nose and nasal sinuses: Secondary | ICD-10-CM | POA: Insufficient documentation

## 2021-05-29 DIAGNOSIS — R059 Cough, unspecified: Secondary | ICD-10-CM | POA: Diagnosis not present

## 2021-05-29 LAB — TYPE AND SCREEN
ABO/RH(D): B POS
Antibody Screen: NEGATIVE

## 2021-05-29 LAB — CBC WITH DIFFERENTIAL/PLATELET
Abs Immature Granulocytes: 0.01 10*3/uL (ref 0.00–0.07)
Basophils Absolute: 0 10*3/uL (ref 0.0–0.1)
Basophils Relative: 1 %
Eosinophils Absolute: 0.1 10*3/uL (ref 0.0–0.5)
Eosinophils Relative: 3 %
HCT: 33.5 % — ABNORMAL LOW (ref 36.0–46.0)
Hemoglobin: 10.7 g/dL — ABNORMAL LOW (ref 12.0–15.0)
Immature Granulocytes: 0 %
Lymphocytes Relative: 22 %
Lymphs Abs: 0.8 10*3/uL (ref 0.7–4.0)
MCH: 30.4 pg (ref 26.0–34.0)
MCHC: 31.9 g/dL (ref 30.0–36.0)
MCV: 95.2 fL (ref 80.0–100.0)
Monocytes Absolute: 0.4 10*3/uL (ref 0.1–1.0)
Monocytes Relative: 10 %
Neutro Abs: 2.5 10*3/uL (ref 1.7–7.7)
Neutrophils Relative %: 64 %
Platelets: 289 10*3/uL (ref 150–400)
RBC: 3.52 MIL/uL — ABNORMAL LOW (ref 3.87–5.11)
RDW: 14.8 % (ref 11.5–15.5)
WBC: 3.9 10*3/uL — ABNORMAL LOW (ref 4.0–10.5)
nRBC: 0 % (ref 0.0–0.2)

## 2021-05-29 LAB — COMPREHENSIVE METABOLIC PANEL
ALT: 16 U/L (ref 0–44)
AST: 25 U/L (ref 15–41)
Albumin: 3.7 g/dL (ref 3.5–5.0)
Alkaline Phosphatase: 60 U/L (ref 38–126)
Anion gap: 8 (ref 5–15)
BUN: 18 mg/dL (ref 8–23)
CO2: 28 mmol/L (ref 22–32)
Calcium: 9.4 mg/dL (ref 8.9–10.3)
Chloride: 103 mmol/L (ref 98–111)
Creatinine, Ser: 1.81 mg/dL — ABNORMAL HIGH (ref 0.44–1.00)
GFR, Estimated: 25 mL/min — ABNORMAL LOW (ref >60)
Glucose, Bld: 118 mg/dL — ABNORMAL HIGH (ref 70–99)
Potassium: 4.7 mmol/L (ref 3.5–5.1)
Sodium: 139 mmol/L (ref 135–145)
Total Bilirubin: 0.5 mg/dL (ref 0.3–1.2)
Total Protein: 6.8 g/dL (ref 6.5–8.1)

## 2021-05-29 NOTE — ED Provider Triage Note (Signed)
Emergency Medicine Provider Triage Evaluation Note ? ?[name] , a 88 y.o. female  was evaluated in triage.  Pt complains of epistaxis to left naris that began earlier today and lasted approximately 30 minutes.  Patient was able to get the bleeding to stop.  She does mention she has also had a cold for the past 10 days and has been coughing, productive in nature.  No fevers or chills.  She would also like to address blood in her stool.  She does take iron supplements daily and typically has a black looking stools however has noticed bright red blood in the toilet bowl.  Denies any abdominal pain.  No vomiting.  Is not anticoagulated.. ? ?Review of Systems  ?Positive: + epistaxis, cough, BRBPR ?Negative: - fevers, chills, SOB, abd pain ? ?Physical Exam  ?BP (!) 142/62   Pulse 73   Temp 98.6 ?F (37 ?C) (Oral)   Resp 18   SpO2 99%  ?Gen:   Awake, no distress   ?Resp:  Normal effort  ?MSK:   Moves extremities without difficulty  ?Other:   ? ?Medical Decision Making  ?Medically screening exam initiated at 9:05 PM.  Appropriate orders placed.  SHILPA BUSHEE was informed that the remainder of the evaluation will be completed by another provider, this initial triage assessment does not replace that evaluation, and the importance of remaining in the ED until their evaluation is complete. ? ? ?  ?Tanda Rockers, PA-C ?05/29/21 2106 ? ?

## 2021-05-29 NOTE — ED Triage Notes (Signed)
Pt states that tonight her nose started to bleed and it has stopped now and that she has had a cough lingering for the past 10 days, productive white sputum, pt also states that she has dark red stools that has been going on for a while.  ?

## 2021-05-30 ENCOUNTER — Other Ambulatory Visit: Payer: Self-pay

## 2021-05-30 DIAGNOSIS — K625 Hemorrhage of anus and rectum: Secondary | ICD-10-CM | POA: Diagnosis not present

## 2021-05-30 LAB — POC OCCULT BLOOD, ED: Fecal Occult Bld: POSITIVE — AB

## 2021-05-30 MED ORDER — SODIUM CHLORIDE 0.9 % IV BOLUS
500.0000 mL | Freq: Once | INTRAVENOUS | Status: AC
  Administered 2021-05-30: 500 mL via INTRAVENOUS

## 2021-05-30 NOTE — ED Notes (Signed)
Pts daughter contacted to request to return to ED to pick pt up after DC.  ?

## 2021-05-30 NOTE — ED Notes (Signed)
Patient didn't want to put on a gown at this time. She stated that she wanted to get more warmer first before she change. ?

## 2021-05-30 NOTE — ED Notes (Signed)
Patient verbalizes understanding of discharge instructions. Opportunity for questioning and answers were provided. Armband removed by staff, pt discharged from ED. Pt taken to ED entrance via wheelchair, assisted into vehicle.  ? ?

## 2021-05-30 NOTE — ED Provider Notes (Signed)
?[facility] EMERGENCY DEPARTMENT ?Provider Note ? ? ?CSN: [id number] ?Arrival date & time: 05/29/21  2023 ? ?  ? ?History ? ?Chief Complaint  ?Patient presents with  ? Epistaxis  ? GI Bleeding  ? ? ?[name] is a 88 y.o. female who presents to the emergency department complaining of epistaxis occurring yesterday.  She notes that her episode of epistaxis lasted about 10-15 minutes.  Patient notes that she was blowing her nose when the epistaxis began.  Patient has had a cold since 05/17/2021.  She is not on any anticoagulants at this time.  Patient has associated rhinorrhea, productive cough, watery eyes.  Denies fever, chills, nasal congestion, shortness of breath, chest pain. ? ?Patient also notes an episode of bright red blood in the toilet.  Patient notes that she takes iron supplements at this time.  Denies past medical history of hemorrhoids.  Denies blood being intertwined with her stool.  Patient notes she has a history of similar symptoms before in the past.  Denies abdominal pain, nausea, vomiting, hematuria, vaginal bleeding. ? ? ?The history is provided by the patient. No language interpreter was used.  ? ?  ? ?Home Medications ?Prior to Admission medications   ?Medication Sig Start Date End Date Taking? Authorizing Provider  ?acetaminophen (TYLENOL) 650 MG CR tablet Take 650 mg by mouth daily.    [provider]  ?amLODipine (NORVASC) 5 MG tablet Take 1 tablet by mouth once daily 10/24/20   Nahser, Deloris Ping, MD  ?Cholecalciferol (VITAMIN D3) 2000 units TABS Take 2,000 Units by mouth daily.    [provider]  ?diclofenac sodium (VOLTAREN) 1 % GEL Apply 2 g topically 2 (two) times daily as needed (pain).     [provider]  ?famotidine (PEPCID) 20 MG tablet Take 1 tablet (20 mg total) by mouth 2 (two) times daily. 05/12/21   Erick Alley, DO  ?Ferrous Sulfate 27 MG TABS Take 1 tablet by mouth daily.    [provider]  ?furosemide (LASIX) 20 MG  tablet TAKE 1 TABLET BY MOUTH AS NEEDED FOR EDEMA ?Patient taking differently: 20 mg daily. 10/24/20   Nahser, Deloris Ping, MD  ?isosorbide mononitrate (IMDUR) 120 MG 24 hr tablet Take 1 tablet by mouth once daily 10/24/20   Nahser, Deloris Ping, MD  ?lidocaine (LIDODERM) 5 % Place 1 patch onto the skin daily. Remove & Discard patch within 12 hours or as directed by MD 09/22/20   Erick Alley, DO  ?Multiple Vitamin (MULITIVITAMIN WITH MINERALS) TABS Take 1 tablet by mouth daily.    [provider]  ?nitroGLYCERIN (NITROSTAT) 0.4 MG SL tablet Place 0.4 mg under the tongue every 5 (five) minutes as needed for chest pain.     [provider]  ?polyvinyl alcohol (LIQUIFILM TEARS) 1.4 % ophthalmic solution Place 1 drop into both eyes as needed for dry eyes.    [provider]  ?PROAIR HFA 108 (90 Base) MCG/ACT inhaler Inhale 1-2 puffs into the lungs every 4 (four) hours as needed for shortness of breath or wheezing. 12/04/16   [provider]  ?   ? ?Allergies    ?Ezetimibe, Lipitor [atorvastatin], Pravachol [pravastatin], Statins, Zocor [simvastatin], and Cholestyramine   ? ?Review of Systems   ?Review of Systems  ?Constitutional:  Negative for chills and fever.  ?HENT:  Positive for nosebleeds and rhinorrhea. Negative for congestion.   ?Respiratory:  Positive for cough (productive). Negative for shortness of breath.   ?Cardiovascular:  Negative for chest pain.  ?Gastrointestinal:  Negative for abdominal pain, nausea and vomiting.  ?Genitourinary:  Negative for hematuria and vaginal bleeding.  ?All other systems reviewed and are negative. ? ?Physical Exam ?Updated Vital Signs ?BP 131/73   Pulse 61   Temp 97.9 ?F (36.6 ?C) (Oral)   Resp 14   SpO2 100%  ?Physical Exam ?Vitals and nursing note reviewed. Exam conducted with a chaperone present.  ?Constitutional:   ?   General: She is not in acute distress. ?   Appearance: She is not diaphoretic.  ?HENT:  ?   Head: Normocephalic and atraumatic.   ?   Nose: Nose normal.  ?   Right Nostril: No epistaxis or septal hematoma.  ?   Left Nostril: No epistaxis or septal hematoma.  ?   Comments: No evidence of epistaxis noted to bilateral naris.  No septal hematoma noted bilaterally. ?   Mouth/Throat:  ?   Pharynx: No oropharyngeal exudate.  ?Eyes:  ?   General: No scleral icterus. ?   Conjunctiva/sclera: Conjunctivae normal.  ?Cardiovascular:  ?   Rate and Rhythm: Normal rate and regular rhythm.  ?   Pulses: Normal pulses.  ?   Heart sounds: Normal heart sounds.  ?Pulmonary:  ?   Effort: Pulmonary effort is normal. No respiratory distress.  ?   Breath sounds: Normal breath sounds. No wheezing.  ?Abdominal:  ?   General: Bowel sounds are normal.  ?   Palpations: Abdomen is soft. There is no mass.  ?   Tenderness: There is no abdominal tenderness. There is no guarding or rebound.  ?Genitourinary: ?   Rectum: No mass, tenderness, anal fissure or external hemorrhoid.  ?   Comments: RN chaperone present for exam. No TTP or gross blood noted to rectum. No anal fissure noted. No external hemorrhoid noted.  ?Musculoskeletal:     ?   General: Normal range of motion.  ?   Cervical back: Normal range of motion and neck supple.  ?Skin: ?   General: Skin is warm and dry.  ?Neurological:  ?   Mental Status: She is alert.  ?Psychiatric:     ?   Behavior: Behavior normal.  ? ? ?ED Results / Procedures / Treatments   ?Labs ?(all labs ordered are listed, but only abnormal results are displayed) ?Labs Reviewed  ?CBC WITH DIFFERENTIAL/PLATELET - Abnormal; Notable for the following components:  ?    Result Value  ? WBC 3.9 (*)   ? RBC 3.52 (*)   ? Hemoglobin 10.7 (*)   ? HCT 33.5 (*)   ? All other components within normal limits  ?COMPREHENSIVE METABOLIC PANEL - Abnormal; Notable for the following components:  ? Glucose, Bld 118 (*)   ? Creatinine, Ser 1.81 (*)   ? GFR, Estimated 25 (*)   ? All other components within normal limits  ?POC OCCULT BLOOD, ED - Abnormal; Notable for the  following components:  ? Fecal Occult Bld POSITIVE (*)   ? All other components within normal limits  ?TYPE AND SCREEN  ? ? ?EKG ?EKG Interpretation ? ?Date/Time:  Monday May 29 2021 21:10:20 EDT ?Ventricular Rate:  78 ?PR Interval:  260 ?QRS Duration: 140 ?QT Interval:  422 ?QTC Calculation: 481 ?R Axis:   70 ?Text Interpretation: Sinus rhythm with 1st degree A-V block Non-specific intra-ventricular conduction block Cannot rule out Septal infarct , age undetermined ST-t wave abnormality Abnormal ECG Confirmed by Gerhard Munch [phone]) on 05/30/2021 8:07:38 AM ? ?Radiology ?  DG Chest 2 View ? ?Result Date: 05/29/2021 ?CLINICAL DATA:  Cough EXAM: CHEST - 2 VIEW COMPARISON:  05/21/2019 chest radiograph. FINDINGS: Stable cardiomediastinal silhouette with mild cardiomegaly and moderate hiatal hernia. No pneumothorax. No pleural effusion. No pulmonary edema. No acute consolidative airspace disease. Minimal streaky bibasilar scarring versus atelectasis. IMPRESSION: 1. Stable mild cardiomegaly without pulmonary edema. 2. Minimal streaky bibasilar scarring versus atelectasis. 3. Moderate hiatal hernia. Electronically Signed   By: Delbert PhenixJason A Poff M.D.   On: 05/29/2021 21:56   ? ?Procedures ?Procedures  ? ? ?Medications Ordered in ED ?Medications  ?sodium chloride 0.9 % bolus 500 mL (0 mLs Intravenous Stopped 05/30/21 1023)  ? ? ?ED Course/ Medical Decision Making/ A&P ?Clinical Course as of 05/30/21 1058  ?Tue May 30, 2021  ?0808 Fecal Occult Blood, POC(!): POSITIVE [SB]  ?[id number] Pt re-evaluated and noted to be asleep on stretcher [SB]  ?1004 Patient evaluated bedside and notified of positive fecal occult card.  Discussed discharge treatment plan with patient consistent of contacting her gastroenterologist, MiraLAX, increase water, increase fiber.  Answered all available questions.  Patient for safe discharge. [SB]  ?  ?Clinical Course User Index ?[SB] Tanga Gloor A, PA-C  ? ?                        ?Medical Decision  Making ?Amount and/or Complexity of Data Reviewed ?Labs:  Decision-making details documented in ED Course. ? ? ?Patient notes an episode of epistaxis yesterday prior to arrival lasting about 10-15 minutes.  Denies being on

## 2021-05-30 NOTE — ED Notes (Signed)
Pt placed on purewick 

## 2021-05-30 NOTE — Discharge Instructions (Addendum)
It was a pleasure taking care of you today!  ? ?Your labs showed silent blood in the stool, however, were unremarkable. Your chest xray was unremarkable as well. You will be sent a prescription for Miralax to aid with stool softening.  Take at least 1 capful of your MiraLAX and mix it in with about 8 ounces of water daily for 2 weeks. It is important to maintain hydration and ensure to maintain fluid intake.  Ensure to eat fiber rich foods.  Call your primary care provider to set up a follow up appointment regarding today's ED visit. Call your GI doctor at [facility] today to set up a follow up appointment. Return to the emergency department if you are experiencing increasing/worsening blood in stool, abdominal pain, fever, or worsening symptoms. ?

## 2021-06-03 ENCOUNTER — Telehealth: Payer: Self-pay

## 2021-06-03 NOTE — Telephone Encounter (Signed)
RNCM received call from [name] stating someone called her from [facility] regarding her medication. This RNCM reviewed chart and did not see any documentation regarding call from [facility]. This RNCM asked patient if possibly call was from her pharmacy. This RNCM encouraged patient to call back if she has any additional concerns re: [facility].  ?

## 2021-06-09 ENCOUNTER — Other Ambulatory Visit: Payer: 59 | Admitting: Occupational Therapy

## 2021-06-10 NOTE — Patient Instructions (Addendum)
She would like to feel more safer in and out of the house: At front door small porch/deck for her sit on out front with a ramp towards car, new front door or paint and adjust so door closes more easily, new storm door that closes more easily. Back door: new door or paint and adjust so it closes easily (new lower door knob), new screen door that closes normally. Fixing roof/gutters so water does not leak into kitchen when it rains, MET ? ?She would like to keep inside of house dry (leaks at back door and front bedroom window when it rains): New roof, gutters, front bedroom window.MET ?

## 2021-06-10 NOTE — Patient Outreach (Signed)
Aging Gracefully Program ? ?OT Follow-Up Visit ? ?06/10/2021 ? ?Koren Bound ?14-Feb-1923 ?[id number] ? ?Visit:  3- Third Visit ? ?Start Time:  1500 ?End Time:  4650 ?Total Minutes:  30 ? ?Readiness to Change Score :  Readiness to Change Score: 10 ? ?Patient Education: ?Education Provided: Yes ?Education Details: Pt provided with handouts and I went over with her how to get up from a fall and Safety checklist for home ?Person(s) Educated: Patient ?Comprehension: Verbalized Understanding ? ?Goals:  ? Goals Addressed   ? ?  ?  ?  ?  ? This Visit's Progress  ?  COMPLETED: Patient Stated     ?  She would like to feel more safer in and out of the house: At front door small porch/deck for her sit on out front with a ramp towards car, new front door or paint and adjust so door closes more easily, new storm door that closes more easily. Back door: new door or paint and adjust so it closes easily (new lower door knob), new screen door that closes normally. Fixing roof/gutters so water does not leak into kitchen when it rains, MET ?  ?  COMPLETED: Patient Stated     ?  She would like to keep inside of house dry (leaks at back door and front bedroom window when it rains): New roof, gutters, front bedroom window.MET ?  ? ?  ? ? ?Post Clinical Reasoning: ?Clinician View Of Client Situation:: Today Ms. Laske was seen for me to go over education with her and to let her know that [facility] is aware she was thinking the ramp would be different, that [facility] contacted the tree company again about fixing her cement step, and that [facility] does not provide fire extinguishers--and that I will provide one for her next visit. ?Client View Of His/Her Situation:: Ms. Vahey continues to feel good about how she is managing for herself. She did recently have to go the hosptial but is home now. ?Next Visit Plan:: Fire extinguisher and TIps for Aging at home book ? ?[place], OTR/L ?Aging Gracefully ?[phone] ? ? ? ?

## 2021-06-19 ENCOUNTER — Ambulatory Visit (INDEPENDENT_AMBULATORY_CARE_PROVIDER_SITE_OTHER): Payer: 59 | Admitting: Physician Assistant

## 2021-06-19 ENCOUNTER — Encounter: Payer: Self-pay | Admitting: Physician Assistant

## 2021-06-19 VITALS — BP 136/62 | HR 87 | Ht 63.0 in | Wt 193.4 lb

## 2021-06-19 DIAGNOSIS — I25119 Atherosclerotic heart disease of native coronary artery with unspecified angina pectoris: Secondary | ICD-10-CM

## 2021-06-19 DIAGNOSIS — I350 Nonrheumatic aortic (valve) stenosis: Secondary | ICD-10-CM | POA: Diagnosis not present

## 2021-06-19 DIAGNOSIS — N1831 Chronic kidney disease, stage 3a: Secondary | ICD-10-CM | POA: Diagnosis not present

## 2021-06-19 DIAGNOSIS — I10 Essential (primary) hypertension: Secondary | ICD-10-CM

## 2021-06-19 DIAGNOSIS — K219 Gastro-esophageal reflux disease without esophagitis: Secondary | ICD-10-CM

## 2021-06-19 DIAGNOSIS — K921 Melena: Secondary | ICD-10-CM

## 2021-06-19 MED ORDER — AMLODIPINE BESYLATE 2.5 MG PO TABS: 2.5000 mg | ORAL_TABLET | Freq: Every day | ORAL | 3 refills | Status: DC

## 2021-06-19 NOTE — Assessment & Plan Note (Signed)
I suspect a lot of her symptoms are related to acid reflux.  She stopped taking famotidine altogether when she went to the emergency room.  She stopped taking Nexium some years ago due to decreased bone density.  I have asked her to take omeprazole 20 mg daily for 1 week.  After 1 week, she can resume famotidine 20 mg once daily. ?

## 2021-06-19 NOTE — Assessment & Plan Note (Signed)
Mild AS.  Mean gradient 13.2 in 2021.  Consider f/u echocardiogram in 1 year.  ?

## 2021-06-19 NOTE — Assessment & Plan Note (Addendum)
Recent creatinine 1.81.  Obtain follow-up BMET today. ?

## 2021-06-19 NOTE — Assessment & Plan Note (Signed)
Blood pressure is well controlled.  Continue isosorbide mononitrate 120 mg daily.  Adjust amlodipine to 5 mg in the morning and 2.5 mg in the evening for antianginal as noted above. ?

## 2021-06-19 NOTE — Patient Instructions (Addendum)
Medication Instructions:  ?Your physician has recommended you make the following change in your medication:  ? START Amlodipine 2.5 mg take it in the P.M 12 hours after your morning dose ? START Over The Counter Prilosec, take 1 tablet daily for 1 week then stop and resume the Pepcid ? ? ?*If you need a refill on your cardiac medications before your next appointment, please call your pharmacy* ? ? ?Lab Work: ?TODAY:  CBC & BMET ? ?If you have labs (blood work) drawn today and your tests are completely normal, you will receive your results only by: ?MyChart Message (if you have MyChart) OR ?A paper copy in the mail ?If you have any lab test that is abnormal or we need to change your treatment, we will call you to review the results. ? ? ?Testing/Procedures: ?None ordered ? ? ?Follow-Up: ?At [facility], you and your health needs are our priority.  As part of our continuing mission to provide you with exceptional heart care, we have created designated Provider Care Teams.  These Care Teams include your primary Cardiologist (physician) and Advanced Practice Providers (APPs -  Physician Assistants and Nurse Practitioners) who all work together to provide you with the care you need, when you need it. ? ?We recommend signing up for the patient portal called "MyChart".  Sign up information is provided on this After Visit Summary.  MyChart is used to connect with patients for Virtual Visits (Telemedicine).  Patients are able to view lab/test results, encounter notes, upcoming appointments, etc.  Non-urgent messages can be sent to your provider as well.   ?To learn more about what you can do with MyChart, go to [email or web address].   ? ?Your next appointment:   ?3 month(s) ? ?The format for your next appointment:   ?In Person ? ?Provider:   ?Kristeen Miss, MD  ? ? ?Other Instructions ?CALL DR. Marge Duncans OFFICE, AT GASTROENTEROLOGY, AND MAKE AN APPOINTMENT FOR BLOOD IN STOOL ? ?Important Information About  Sugar ? ? ? ? ?  ?

## 2021-06-19 NOTE — Progress Notes (Signed)
?Cardiology Office Note:   ? ?Date:  06/19/2021  ? ?ID:  [name], DOB 05-30-1923, MRN [id number] ? ?PCP:  Precious Gilding, DO  ?[facility] Providers ?Cardiologist:  Mertie Moores, MD ?Cardiology APP:  Liliane Shi, PA-C     ?Referring MD: Precious Gilding, DO  ? ?Chief Complaint:  F/u for CAD and Hospitalization Follow-up (ED visit with chest pain, rectal bleeding.) ?  ? ?Patient Profile: ?Coronary artery disease ?Non-obstructive by cath in 2010 ?admx 09/2017 w/ CP >> med Rx due to age, CKD ?Aortic stenosis ?Echo 09/2017: Mean gradient 11 mmHg ?Echo 8/21: EF 60-65, mild AAS (mean gradient 13.2), RVSP 52.6 ?Chronic kidney disease 3 ?Hypertension ?beta-blocker, HCTZ DCd 2/2 ? BP  ?Prior GI bleeding  ?Left Bundle Branch Block ? ?Prior CV Studies: ?LONG TERM MONITOR (8-14 DAYS) INTERPRETATION 11/12/2019 ?Narrative ?? Sinus rhythm including NSR and sinus bradycardia ?? Slowest HR recorded was 34 at 2:48 PMnon Sept. 21, 2021 ?? She had a 4 beat run of slow ventricular tachycardia ( HR of 104 ) which was not symptomatic ?  ?Carotid US 09/18/2019 ?Bilateral ICA 1-39 ?  ?Echocardiogram 09/18/2019 ?EF 60-65, normal wall motion, mild LVH, GR 1 DD normal RVSF, RVSP 52.6 (moderately elevated), moderate LAE, mild RAE, mild MR, mild aortic stenosis (mean gradient 13.2 mmHg) ?  ?Cardiac monitor 08/2018 ?Sinus rhythm, sinus bradycardia, rare PVCs ?  ?Echocardiogram 09/30/2017 ?EF 55-60, normal wall motion, grade 2 diastolic dysfunction, mild aortic stenosis (mean 11), MAC, moderate LAE, mild RAE, mild TR, PASP 33 ?  ?Cardiac catheterization 01/08/2009 ?EF 55-60 ?LAD proximal 15-20, mid 30-40; D2 ostial 20 ?LCx proximal 20-25, distal 40-50 ?RCA proximal 20-25, mid 50-55, distal 20-25 ? ?History of Present Illness:   ?[name] is a 88 y.o. female with the above problem list.  She was last seen in Jan 2023 by Laurann Montana, NP.  She returns for f/u.   ? ?She was seen in the ED 05/29/21 for epistaxis and rectal bleeding.  Her Hgb was  10.7 (down from 11.7 in Feb 2022).  Hemoccult was pos.  She was to call GI for f/u (Dr. Michail Sermon).  She has not called yet.  She notes she had a lot of belching prior to this.  Her PCP had increased her Famotidine to twice daily.  She thought it was related to this and stopped it.  She notes that she did have chest pain and coughing when she went to the ED.  She still has some occasional chest pain.  She has taken NTG with some improvement.  She ultimately had relief with TUMs.  She is weak and has a hard time getting around with her back. She does feel short of breath.  She is still seeing a pink tinge with her stools.   ?    ?   ?Past Medical History:  ?Diagnosis Date  ? Acid reflux   ? takes Nexium daily  ? Anemia   ? takes Ferrous Sulfate daily  ? Aortic stenosis   ? mild AS pk grad 18, mean grad 9, AVA 1.32 cm 03/2015 echo (Dr. Einar Gip)  ? Arthritis   ? Chronic kidney disease (CKD), stage III (moderate) (HCC)   ? Diverticulitis   ? Hematochezia 03/2015  ? High cholesterol   ? can't take the meds  ? History of blood transfusion 03/2015  ? no abnormal reaction noted  ? History of GI diverticular bleed   ? HTN (hypertension)   ? takes Losartan-HCTZ and  Metoprolol daily  ? Joint pain   ? Joint swelling   ? Nocturia   ? Numbness   ? in legs  ? Peripheral edema   ? occasionally  ? Shortness of breath dyspnea   ? occasionally and with exertion  ? Slow urinary stream   ? at times  ? ?Current Medications: ?Current Meds  ?Medication Sig  ? acetaminophen (TYLENOL) 650 MG CR tablet Take 650 mg by mouth daily.  ? amLODipine (NORVASC) 2.5 MG tablet Take 1 tablet (2.5 mg total) by mouth daily. IN ADDITION TO THE 5 MG . TAKE 12 HOURS APART  ? amLODipine (NORVASC) 5 MG tablet Take 1 tablet by mouth once daily  ? Cholecalciferol (VITAMIN D3) 2000 units TABS Take 2,000 Units by mouth daily.  ? diclofenac sodium (VOLTAREN) 1 % GEL Apply 2 g topically 2 (two) times daily as needed (pain).   ? Ferrous Sulfate 27 MG TABS Take 1 tablet by  mouth daily.  ? furosemide (LASIX) 20 MG tablet TAKE 1 TABLET BY MOUTH AS NEEDED FOR EDEMA  ? isosorbide mononitrate (IMDUR) 120 MG 24 hr tablet Take 1 tablet by mouth once daily  ? lidocaine (LIDODERM) 5 % Place 1 patch onto the skin daily. Remove & Discard patch within 12 hours or as directed by MD  ? Multiple Vitamin (MULITIVITAMIN WITH MINERALS) TABS Take 1 tablet by mouth daily.  ? nitroGLYCERIN (NITROSTAT) 0.4 MG SL tablet Place 0.4 mg under the tongue every 5 (five) minutes as needed for chest pain.   ? polyvinyl alcohol (LIQUIFILM TEARS) 1.4 % ophthalmic solution Place 1 drop into both eyes as needed for dry eyes.  ? PROAIR HFA 108 (90 Base) MCG/ACT inhaler Inhale 1-2 puffs into the lungs every 4 (four) hours as needed for shortness of breath or wheezing.  ?  ?Allergies:   Ezetimibe, Lipitor [atorvastatin], Pravachol [pravastatin], Statins, Zocor [simvastatin], and Cholestyramine  ? ?Social History  ? ?Tobacco Use  ? Smoking status: Former  ? Smokeless tobacco: Never  ? Tobacco comments:  ?  quit smoking 8+ yrs ago  ?Vaping Use  ? Vaping Use: Never used  ?Substance Use Topics  ? Alcohol use: No  ?  Comment: quit yrs ago  ? Drug use: No  ?  ?Family Hx: ?The patient's family history includes Cancer in an other family member; Diabetes in an other family member; Heart disease in an other family member; Hypertension in an other family member. ? ?Review of Systems  ?Gastrointestinal:  Positive for flatus.   ? ?EKGs/Labs/Other Test Reviewed:   ? ?EKG:  EKG is not ordered today.  The ekg ordered today demonstrates n/a ? ?Recent Labs: ?08/02/2020: NT-Pro BNP 611 ?05/29/2021: ALT 16; BUN 18; Creatinine, Ser 1.81; Hemoglobin 10.7; Platelets 289; Potassium 4.7; Sodium 139  ? ?Recent Lipid Panel ?No results for input(s): CHOL, TRIG, HDL, VLDL, LDLCALC, LDLDIRECT in the last 8760 hours.  ? ?Risk Assessment/Calculations:   ?  ?    ?Physical Exam:   ? ?VS:  BP 136/62   Pulse 87   Ht _0  (1.6 m)   Wt 193 lb 6.4 oz (87.7  kg)   SpO2 98%   BMI 34.26 kg/m?    ? ?Wt Readings from Last 3 Encounters:  ?06/19/21 193 lb 6.4 oz (87.7 kg)  ?05/12/21 196 lb 3.2 oz (89 kg)  ?04/20/21 195 lb 12.8 oz (88.8 kg)  ?  ?Constitutional:   ?   Appearance: Healthy appearance. Not in distress.  ?Pulmonary:  ?  Effort: Pulmonary effort is normal.  ?   Breath sounds: No wheezing. No rales.  ?Cardiovascular:  ?   Normal rate. Regular rhythm. Normal S1. Normal S2.   ?   Murmurs: There is a grade 2/6 crescendo-decrescendo systolic murmur at the URSB.  ?Edema: ?   Peripheral edema present. ?   Ankle: bilateral trace edema of the ankle. ?Abdominal:  ?   Palpations: Abdomen is soft.  ?Musculoskeletal:  ?   Cervical back: Neck supple. Skin: ?   General: Skin is warm and dry.  ?Neurological:  ?   Mental Status: Alert and oriented to person, place and time.  ?   Cranial Nerves: Cranial nerves are intact.  ?  ? ?    ?ASSESSMENT & PLAN:   ?Aortic stenosis ?Mild AS.  Mean gradient 13.2 in 2021.  Consider f/u echocardiogram in 1 year.  ? ?CAD (coronary artery disease) ?She has been managed medically for angina since 2019 due to advanced age and chronic kidney disease.  She has a lot of chest pain.  She has acid reflux symptoms as well. She has a hx of GI bleed and has had a recent trip to the ED with rectal bleeding.  She has had improvement in her symptoms with NTG and antacids.  It is unclear if her symptoms are mainly related to angina versus acid reflux.  She recently had her metoprolol discontinued secondary to long first-degree AV block.  I reviewed several of her recent blood pressures.  I think she can tolerate a small increase in her amlodipine. ?Increase amlodipine to 5 mg in the morning and 2.5 mg in the evening ?Continue isosorbide mononitrate 120 mg daily ?She should not take aspirin due to history of GI bleed ? ?Blood in stool ?She is continue to see pink-tinged bowel movements.  Obtain follow-up BMET CBC today.  I have encouraged her to call  gastroenterology for follow-up.  She is established with Dr. Michail Sermon. ? ?GERD without esophagitis ?I suspect a lot of her symptoms are related to acid reflux.  She stopped taking famotidine altogether when she went

## 2021-06-19 NOTE — Assessment & Plan Note (Signed)
She is continue to see pink-tinged bowel movements.  Obtain follow-up BMET CBC today.  I have encouraged her to call gastroenterology for follow-up.  She is established with Dr. Bosie Clos. ?

## 2021-06-19 NOTE — Assessment & Plan Note (Signed)
She has been managed medically for angina since 2019 due to advanced age and chronic kidney disease.  She has a lot of chest pain.  She has acid reflux symptoms as well. She has a hx of GI bleed and has had a recent trip to the ED with rectal bleeding.  She has had improvement in her symptoms with NTG and antacids.  It is unclear if her symptoms are mainly related to angina versus acid reflux.  She recently had her metoprolol discontinued secondary to long first-degree AV block.  I reviewed several of her recent blood pressures.  I think she can tolerate a small increase in her amlodipine. ?? Increase amlodipine to 5 mg in the morning and 2.5 mg in the evening ?? Continue isosorbide mononitrate 120 mg daily ?? She should not take aspirin due to history of GI bleed ?

## 2021-06-20 LAB — BASIC METABOLIC PANEL
BUN/Creatinine Ratio: 13 (ref 12–28)
BUN: 21 mg/dL (ref 10–36)
CO2: 18 mmol/L — ABNORMAL LOW (ref 20–29)
Calcium: 9.7 mg/dL (ref 8.7–10.3)
Chloride: 104 mmol/L (ref 96–106)
Creatinine, Ser: 1.62 mg/dL — ABNORMAL HIGH (ref 0.57–1.00)
Glucose: 103 mg/dL — ABNORMAL HIGH (ref 70–99)
Potassium: 4.2 mmol/L (ref 3.5–5.2)
Sodium: 142 mmol/L (ref 134–144)
eGFR: 29 mL/min/{1.73_m2} — ABNORMAL LOW (ref >59)

## 2021-06-20 LAB — CBC
Hematocrit: 30.4 % — ABNORMAL LOW (ref 34.0–46.6)
Hemoglobin: 10.7 g/dL — ABNORMAL LOW (ref 11.1–15.9)
MCH: 31.5 pg (ref 26.6–33.0)
MCHC: 35.2 g/dL (ref 31.5–35.7)
MCV: 89 fL (ref 79–97)
Platelets: 236 10*3/uL (ref 150–450)
RBC: 3.4 x10E6/uL — ABNORMAL LOW (ref 3.77–5.28)
RDW: 14.2 % (ref 11.7–15.4)
WBC: 4.1 10*3/uL (ref 3.4–10.8)

## 2021-06-20 NOTE — Progress Notes (Signed)
Pt has been made aware of normal result and verbalized understanding.  jw

## 2021-07-17 ENCOUNTER — Other Ambulatory Visit: Payer: Self-pay | Admitting: Cardiovascular Disease

## 2021-08-03 ENCOUNTER — Other Ambulatory Visit: Payer: 59 | Admitting: Occupational Therapy

## 2021-08-03 NOTE — Patient Outreach (Signed)
Aging Gracefully Program  OT FINAL Visit  08/03/2021  [name] 1923/06/28 [id number]  Visit:  4- Fourth Visit  Start Time:  1605 End Time:  1700 Total Minutes:  55  Readiness to Change:  Readiness to Change Score: 10  Patient Education: Education Provided: Yes Education Details: Patient provided "safety at home book" and provided a Government social research officer (explained and demonstrated how she would use it and she asked to leave the instructtion booklet out too so she could read it). Person(s) Educated: Patient Comprehension: Verbalized Understanding  Goals:  Goals Addressed             This Visit's Progress    COMPLETED: Patient Stated       Training and development officer in home: New Government social research officer and new smoke detector in kitchen as well as check out why the one that is wired in sometimes alarms.  08/03/2021 Fire extinguisher provided. Smoke detector provided by another means per [name].        Post Clinical Reasoning: Client Action (Goal) Four Interventions: Client provided with an all purpose fire extinguisher Did Client Try?: No Reason Client Did Not Try?: Other (cannot actually practice with the fire extinguisher) Targeted Problem Area Status: A Lot Better Clinician View Of Client Situation:: Saw [name] today to give her "Safety at home" booklet and Government social research officer.She continues to do well for herself at 89 years young. She still does as much as she can for herself and her home as well as still driving to the store and some appointments depending on where they are. It has been a pleasure to work with her. Client View Of His/Her Situation:: She feels she is doing good for her age (and she is). "as long as I can do for myself I'm doing good". Next Visit Plan:: This was my last visit with [name].  Ignacia Palma, OTR/L [facility] Re[facility] Services Aging Gracefully [phone] Office [phone] '

## 2021-08-03 NOTE — Patient Instructions (Signed)
Goals Addressed             This Visit's Progress    COMPLETED: Patient Stated       Training and development officer in home: New Government social research officer and new smoke detector in kitchen as well as check out why the one that is wired in sometimes alarms.  08/03/2021 Fire extinguisher provided. Smoke detector provided by another means per Ms. Allshouse.

## 2021-09-13 ENCOUNTER — Other Ambulatory Visit: Payer: Self-pay

## 2021-09-15 ENCOUNTER — Other Ambulatory Visit: Payer: Self-pay

## 2021-09-15 NOTE — Patient Outreach (Signed)
Aging Gracefully Program  09/15/2021  [name] Mar 06, 1923 [id number]   [facility] Evaluation Interviewer made contact with patient. Aging Gracefully 5 months survey completed.      [name] Care Management Assistant [phone]

## 2021-09-18 ENCOUNTER — Ambulatory Visit (INDEPENDENT_AMBULATORY_CARE_PROVIDER_SITE_OTHER): Payer: 59 | Admitting: Cardiovascular Disease

## 2021-09-18 ENCOUNTER — Encounter: Payer: Self-pay | Admitting: Cardiovascular Disease

## 2021-09-18 VITALS — BP 136/68 | HR 71 | Ht 63.0 in | Wt 187.6 lb

## 2021-09-18 DIAGNOSIS — R531 Weakness: Secondary | ICD-10-CM

## 2021-09-18 DIAGNOSIS — I350 Nonrheumatic aortic (valve) stenosis: Secondary | ICD-10-CM

## 2021-09-18 NOTE — Progress Notes (Signed)
Cardiology Office Note:    Date:  09/18/2021   ID:  [name], DOB Jul 03, 1923, MRN [id number]  PCP:  Precious Gilding, DO  Cardiologist:  Mertie Moores, MD  Electrophysiologist:  None   Referring MD: Precious Gilding, DO   Chief Complaint  Patient presents with   Hypertension              [name] is a 88 y.o. female with a hx of mild aortic stenosis, chronic kidney disease stage III, hypertension and history of GI bleed.  She has minimal coronary artery disease by heart catheterization in 2003.  She was recently admitted to the hospital this past summer with episodes of chest pain.  She was thought to be a very poor candidate for invasive/interventional procedures.  She was seen for a return office visit by [name], nurse practitioner on October 23, 2017.  Seems to be going better.    The chest pain has improved on medical therapy  Has occasional palpitations  Occasional left leg swelling   Oct. 27, 2021:  [name] is a 88 year old female with a history of mild aortic stenosis, chronic kidney disease, hypertension and history of GI bleed.  She has minimal coronary artery disease by heart catheterization in 2003.  She is seen for follow-up visit today.  BP is a bit elevated today .  She checks her BP at home.  Is typically normal at home  No cp, Had some palpitations. Wore an event monitor  Showed NSR , sinus bradycardia,  Rare episode of nonsustained VT ( 4 beat run of NS VT)  No evidence of afib    Sept, 12, 2022:  [name] is seen for follow up of her AS, CKD, HTN and GI bleed.  Has no complaints Has a 1st degree AV block - will be reducing her metoprolol to 12. 5 BID    September 18, 2021:  [name] is seen today for follow-up of her aortic stenosis, chronic kidney disease, hypertension and GI bleed.  VSS  No cp,   Has noticed that her HR was slow at one point  Feels weak at times Will DC metoprolol and HCTZ  Event monitor showed brief runs of NSVT ( 4  beats)    Past Medical History:  Diagnosis Date   Acid reflux    takes Nexium daily   Anemia    takes Ferrous Sulfate daily   Aortic stenosis    mild AS pk grad 18, mean grad 9, AVA 1.32 cm 03/2015 echo (Dr. Einar Gip)   Arthritis    Chronic kidney disease (CKD), stage III (moderate) (HCC)    Diverticulitis    Hematochezia 03/2015   High cholesterol    can't take the meds   History of blood transfusion 03/2015   no abnormal reaction noted   History of GI diverticular bleed    HTN (hypertension)    takes Losartan-HCTZ and Metoprolol daily   Joint pain    Joint swelling    Nocturia    Numbness    in legs   Peripheral edema    occasionally   Shortness of breath dyspnea    occasionally and with exertion   Slow urinary stream    at times    Past Surgical History:  Procedure Laterality Date   ABCESS DRAINAGE     abdominal abcess   cataract surgery Bilateral    CHOLECYSTECTOMY     DILATION AND CURETTAGE OF UTERUS     ESOPHAGOGASTRODUODENOSCOPY N/A 02/23/2015  Procedure: ESOPHAGOGASTRODUODENOSCOPY (EGD);  Surgeon: Carman Ching, MD;  Location: [facility] ENDOSCOPY;  Service: Endoscopy;  Laterality: N/A;   FLEXIBLE SIGMOIDOSCOPY N/A 02/24/2015   Procedure: FLEXIBLE SIGMOIDOSCOPY;  Surgeon: Carman Ching, MD;  Location: [facility] ENDOSCOPY;  Service: Endoscopy;  Laterality: N/A;   IR ANGIOGRAM SELECTIVE EACH ADDITIONAL VESSEL  03/06/2017   IR ANGIOGRAM SELECTIVE EACH ADDITIONAL VESSEL  03/06/2017   IR ANGIOGRAM SELECTIVE EACH ADDITIONAL VESSEL  03/06/2017   IR ANGIOGRAM SELECTIVE EACH ADDITIONAL VESSEL  03/08/2017   IR ANGIOGRAM VISCERAL SELECTIVE  03/06/2017   IR ANGIOGRAM VISCERAL SELECTIVE  03/06/2017   IR ANGIOGRAM VISCERAL SELECTIVE  03/06/2017   IR ANGIOGRAM VISCERAL SELECTIVE  03/08/2017   IR ANGIOGRAM VISCERAL SELECTIVE  03/08/2017   IR EMBO ART  VEN HEMORR LYMPH EXTRAV  INC GUIDE ROADMAPPING  03/06/2017   IR EMBO ART  VEN HEMORR LYMPH EXTRAV  INC GUIDE ROADMAPPING  03/08/2017   IR US GUIDE VASC  ACCESS RIGHT  03/06/2017   IR US GUIDE VASC ACCESS RIGHT  03/08/2017   left knee surgery     TOTAL HIP ARTHROPLASTY Left 05/17/2015   Procedure: LEFT TOTAL HIP ARTHROPLASTY ANTERIOR APPROACH;  Surgeon: Kathryne Hitch, MD;  Location: [facility] OR;  Service: Orthopedics;  Laterality: Left;    Current Medications: Current Meds  Medication Sig   acetaminophen (TYLENOL) 650 MG CR tablet Take 650 mg by mouth daily.   amLODipine (NORVASC) 2.5 MG tablet Take 1 tablet (2.5 mg total) by mouth daily. IN ADDITION TO THE 5 MG . TAKE 12 HOURS APART   amLODipine (NORVASC) 5 MG tablet Take 1 tablet by mouth once daily   Cholecalciferol (VITAMIN D3) 2000 units TABS Take 2,000 Units by mouth daily.   diclofenac sodium (VOLTAREN) 1 % GEL Apply 2 g topically 2 (two) times daily as needed (pain).    Ferrous Sulfate 27 MG TABS Take 1 tablet by mouth daily.   furosemide (LASIX) 20 MG tablet TAKE 1 TABLET BY MOUTH AS NEEDED FOR EDEMA   isosorbide mononitrate (IMDUR) 120 MG 24 hr tablet Take 1 tablet by mouth once daily   lidocaine (LIDODERM) 5 % Place 1 patch onto the skin daily. Remove & Discard patch within 12 hours or as directed by MD   Multiple Vitamin (MULITIVITAMIN WITH MINERALS) TABS Take 1 tablet by mouth daily.   nitroGLYCERIN (NITROSTAT) 0.4 MG SL tablet Place 0.4 mg under the tongue every 5 (five) minutes as needed for chest pain.    polyvinyl alcohol (LIQUIFILM TEARS) 1.4 % ophthalmic solution Place 1 drop into both eyes as needed for dry eyes.   PROAIR HFA 108 (90 Base) MCG/ACT inhaler Inhale 1-2 puffs into the lungs every 4 (four) hours as needed for shortness of breath or wheezing.     Allergies:   Ezetimibe, Lipitor [atorvastatin], Pravachol [pravastatin], Statins, Zocor [simvastatin], and Cholestyramine   Social History   Socioeconomic History   Marital status: Widowed    Spouse name: Not on file   Number of children: Not on file   Years of education: Not on file   Highest education level:  Not on file  Occupational History   Not on file  Tobacco Use   Smoking status: Former   Smokeless tobacco: Never   Tobacco comments:    quit smoking 8+ yrs ago  Vaping Use   Vaping Use: Never used  Substance and Sexual Activity   Alcohol use: No    Comment: quit yrs ago   Drug use: No  Sexual activity: Not Currently  Other Topics Concern   Not on file  Social History Narrative   Not on file   Social Determinants of Health   Financial Resource Strain: Low Risk  (09/30/2017)   Overall Financial Resource Strain (CARDIA)    Difficulty of Paying Living Expenses: Not very hard  Food Insecurity: Unknown (09/30/2017)   Hunger Vital Sign    Worried About Running Out of Food in the Last Year: Patient refused    [place] in the Last Year: Patient refused  Transportation Needs: Unknown (09/30/2017)   [place] - Transportation    Lack of Transportation (Medical): Patient refused    Lack of Transportation (Non-Medical): Patient refused  Physical Activity: Insufficiently Active (09/30/2017)   Exercise Vital Sign    Days of Exercise per Week: 1 day    Minutes of Exercise per Session: 10 min  Stress: No Stress Concern Present (09/30/2017)   [place]    Feeling of Stress : Only a little  Social Connections: Somewhat Isolated (09/30/2017)   Social Connection and Isolation Panel [NHANES]    Frequency of Communication with Friends and Family: More than three times a week    Frequency of Social Gatherings with Friends and Family: Three times a week    Attends Religious Services: 1 to 4 times per year    Active Member of Clubs or Organizations: No    Attends Archivist Meetings: Never    Marital Status: Widowed     Family History: The patient's family history includes Cancer in an other family member; Diabetes in an other family member; Heart disease in an other family member; Hypertension in an other family  member.  ROS:   Please see the history of present illness.     All other systems reviewed and are negative.  EKGs/Labs/Other Studies Reviewed:    The following studies were reviewed today:   EKG:    Recent Labs: 05/29/2021: ALT 16 06/19/2021: BUN 21; Creatinine, Ser 1.62; Hemoglobin 10.7; Platelets 236; Potassium 4.2; Sodium 142  Recent Lipid Panel    Component Value Date/Time   CHOL (H) 01/06/2009 0149    209        ATP III CLASSIFICATION:  <200     mg/dL   Desirable  200-239  mg/dL   Borderline High  >=240    mg/dL   High          TRIG 51 01/06/2009 0149   HDL 56 01/06/2009 0149   CHOLHDL 3.7 01/06/2009 0149   VLDL 10 01/06/2009 0149   LDLCALC (H) 01/06/2009 0149    143        Total Cholesterol/HDL:CHD Risk Coronary Heart Disease Risk Table                     Men   Women  1/2 Average Risk   3.4   3.3  Average Risk       5.0   4.4  2 X Average Risk   9.6   7.1  3 X Average Risk  23.4   11.0        Use the calculated Patient Ratio above and the CHD Risk Table to determine the patient's CHD Risk.        ATP III CLASSIFICATION (LDL):  <100     mg/dL   Optimal  100-129  mg/dL   Near or Above  Optimal  130-159  mg/dL   Borderline  182-993  mg/dL   High  >716     mg/dL   Very High    Physical Exam:     Physical Exam: Blood pressure 136/68, pulse 71, height 5\' 3"  (1.6 m), weight 187 lb 9.6 oz (85.1 kg), SpO2 98 %.  GEN:  elderly female,   in no acute distress HEENT: Normal NECK: No JVD; No carotid bruits LYMPHATICS: No lymphadenopathy CARDIAC: RRR  2/6 systolic murmur  RESPIRATORY:  Clear to auscultation without rales, wheezing or rhonchi  ABDOMEN: Soft, non-tender, non-distended MUSCULOSKELETAL:  No edema; No deformity  SKIN: Warm and dry NEUROLOGIC:  Alert and oriented x 3     ASSESSMENT:    1. Aortic valve stenosis, etiology of cardiac valve disease unspecified   2. Weakness     PLAN:       Chest pain : Noncardiac.  2.   Mild aortic stenosis:   Cont conservative therapy observation    3.  Hypertension:   BP is well controlled.   4.  First-degree AV block.      Her echocardiogram from August, 2021 reveals normal left ventricular systolic function.  She has grade 1 diastolic dysfunction.  She has mild aortic stenosis.    Medication Adjustments/Labs and Tests Ordered: Current medicines are reviewed at length with the patient today.  Concerns regarding medicines are outlined above.  No orders of the defined types were placed in this encounter.   No orders of the defined types were placed in this encounter.    Patient Instructions  Medication Instructions:  STOP Metoprolol and HCTZ  *If you need a refill on your cardiac medications before your next appointment, please call your pharmacy*   Lab Work: NONE If you have labs (blood work) drawn today and your tests are completely normal, you will receive your results only by: MyChart Message (if you have MyChart) OR A paper copy in the mail If you have any lab test that is abnormal or we need to change your treatment, we will call you to review the results.   Testing/Procedures: NONE   Follow-Up: At [facility], you and your health needs are our priority.  As part of our continuing mission to provide you with exceptional heart care, we have created designated Provider Care Teams.  These Care Teams include your primary Cardiologist (physician) and Advanced Practice Providers (APPs -  Physician Assistants and Nurse Practitioners) who all work together to provide you with the care you need, when you need it.  We recommend signing up for the patient portal called "MyChart".  Sign up information is provided on this After Visit Summary.  MyChart is used to connect with patients for Virtual Visits (Telemedicine).  Patients are able to view lab/test results, encounter notes, upcoming appointments, etc.  Non-urgent messages can be sent to your provider as  well.   To learn more about what you can do with MyChart, go to [facility].    Your next appointment:   1 year(s)  The format for your next appointment:   In Person  Provider:   [email or web address], or Louann Sjogren {     Important Information About Sugar         Signed, [employer], MD  09/18/2021 3:17 PM    [facility]

## 2021-09-18 NOTE — Patient Instructions (Signed)
Medication Instructions:  STOP Metoprolol and HCTZ  *If you need a refill on your cardiac medications before your next appointment, please call your pharmacy*   Lab Work: NONE If you have labs (blood work) drawn today and your tests are completely normal, you will receive your results only by: MyChart Message (if you have MyChart) OR A paper copy in the mail If you have any lab test that is abnormal or we need to change your treatment, we will call you to review the results.   Testing/Procedures: NONE   Follow-Up: At [facility], you and your health needs are our priority.  As part of our continuing mission to provide you with exceptional heart care, we have created designated Provider Care Teams.  These Care Teams include your primary Cardiologist (physician) and Advanced Practice Providers (APPs -  Physician Assistants and Nurse Practitioners) who all work together to provide you with the care you need, when you need it.  We recommend signing up for the patient portal called "MyChart".  Sign up information is provided on this After Visit Summary.  MyChart is used to connect with patients for Virtual Visits (Telemedicine).  Patients are able to view lab/test results, encounter notes, upcoming appointments, etc.  Non-urgent messages can be sent to your provider as well.   To learn more about what you can do with MyChart, go to [email or web address].    Your next appointment:   1 year(s)  The format for your next appointment:   In Person  Provider:   [place], Lissa Hoard, or [employer] {     Important Information About Sugar

## 2021-10-04 ENCOUNTER — Other Ambulatory Visit: Payer: Self-pay | Admitting: Cardiovascular Disease

## 2021-10-07 NOTE — Progress Notes (Signed)
    SUBJECTIVE:   CHIEF COMPLAINT / HPI:   HTN Patient was seen by cardiology on 09/18/2021 to D/C her metoprolol and HCTZ due to feeling weak with lower heart rate per patient. BP at goal today on Amlodipine 5 mg daily.   Acid Reflux Currently taking Pepcid 20 mg daily and takes TUMS sometimes at night when she lies down. Has symptoms of burning in throat most nights when lying down. At last visit had been advised to take Pepcid BID. Pt tried this but had respiratory symptoms she was worried may be related to the increase and stopped.   Blood in stool Seen in ED on 05/29/21 for rectal bleeding with positive fecal occult. No anal fissures or external hemorrhoids were noted on exam. Her hgb was stable at ED visit. She has had blood in stool on occasion since April, toilet water is sometimes pink. Today pt denies any abdominal pain.  Per chart review, in 2017, colonoscopy was attempted but couldn't be completed d/t severe diverticulosis and recommendation was to monitor hgb. Pt has previously followed with Dr. Bosie Clos.   PERTINENT  PMH / PSH: HTN, acid reflux, diverticulosis   OBJECTIVE:   Vitals:   10/10/21 1006  BP: 130/62  Pulse: 64  SpO2: 100%     General: NAD, pleasant, able to participate in exam Cardiac: RRR, systolic murmur present Respiratory: CTAB, normal effort, No wheezes, rales or rhonchi Abdomen: Bowel sounds present, nontender, nondistended, soft Skin: warm and dry, no rashes noted Neuro: alert, no obvious focal deficits Psych: Normal affect and mood  ASSESSMENT/PLAN:   Essential hypertension Continue Amlodipine 5 mg daily.   Blood in stool Likely related to known severe diverticulitis. I doubt colonoscopy will be recommended. We will check Hgb today with CBC to ensure it is stable and I have advised pt I will reach out to Dr. Bosie Clos for recommendations to see if she should follow up with GI vs just monitoring Hgb.   GERD without esophagitis Advised pt it is  safe to increase Famotidine to 20 mg BID and take tums prn. Previous respiratory symptoms very unlikely to be related when she previously tried this.    -Return for f/u in 4 months  Dr. Erick Alley, DO [facility]

## 2021-10-10 ENCOUNTER — Ambulatory Visit (INDEPENDENT_AMBULATORY_CARE_PROVIDER_SITE_OTHER): Payer: 59 | Admitting: Student

## 2021-10-10 ENCOUNTER — Encounter: Payer: Self-pay | Admitting: Student

## 2021-10-10 ENCOUNTER — Other Ambulatory Visit: Payer: Self-pay

## 2021-10-10 VITALS — BP 130/62 | HR 64 | Wt 189.2 lb

## 2021-10-10 DIAGNOSIS — K219 Gastro-esophageal reflux disease without esophagitis: Secondary | ICD-10-CM | POA: Diagnosis not present

## 2021-10-10 DIAGNOSIS — K921 Melena: Secondary | ICD-10-CM

## 2021-10-10 DIAGNOSIS — I10 Essential (primary) hypertension: Secondary | ICD-10-CM

## 2021-10-10 DIAGNOSIS — D62 Acute posthemorrhagic anemia: Secondary | ICD-10-CM | POA: Diagnosis not present

## 2021-10-10 NOTE — Patient Instructions (Signed)
It was great to see you! Thank you for allowing me to participate in your care!  I recommend that you always bring your medications to each appointment as this makes it easy to ensure you are on the correct medications and helps [place] not miss when refills are needed.  Our plans for today:  - Return in 4 months for a follow up visit  - Take the Pepcid once in the morning and once in the evening -I will call to discuss next steps about the blood in your stool  We are checking some labs today, I will call you if they are abnormal will send you a MyChart message or a letter if they are normal.  If you do not hear about your labs in the next 2 weeks please let us know.  Take care and seek immediate care sooner if you develop any concerns.   Dr. Erick Alley, DO [facility]

## 2021-10-11 LAB — CBC
Hematocrit: 33.1 % — ABNORMAL LOW (ref 34.0–46.6)
Hemoglobin: 10.8 g/dL — ABNORMAL LOW (ref 11.1–15.9)
MCH: 29.5 pg (ref 26.6–33.0)
MCHC: 32.6 g/dL (ref 31.5–35.7)
MCV: 90 fL (ref 79–97)
Platelets: 228 10*3/uL (ref 150–450)
RBC: 3.66 x10E6/uL — ABNORMAL LOW (ref 3.77–5.28)
RDW: 13.3 % (ref 11.7–15.4)
WBC: 3.9 10*3/uL (ref 3.4–10.8)

## 2021-10-11 NOTE — Assessment & Plan Note (Signed)
Likely related to known severe diverticulitis. I doubt colonoscopy will be recommended. We will check Hgb today with CBC to ensure it is stable and I have advised pt I will reach out to Dr. Bosie Clos for recommendations to see if she should follow up with GI vs just monitoring Hgb.

## 2021-10-11 NOTE — Assessment & Plan Note (Addendum)
Advised pt it is safe to increase Famotidine to 20 mg BID and take tums prn. Previous respiratory symptoms very unlikely to be related when she previously tried this.

## 2021-10-11 NOTE — Assessment & Plan Note (Signed)
-   Continue Amlodipine 5mg daily

## 2021-10-12 ENCOUNTER — Telehealth: Payer: Self-pay | Admitting: Student

## 2021-10-12 NOTE — Telephone Encounter (Signed)
Spoke on the phone with pt regarding stable Hgb. I discussed with pt that her rectal bleeding is most likely related to her significant hx of diverticulosis which she is aware of. Instead of me calling Dr. Marge Duncans office, I recommended that she sign a release form to allow [place] to get the records from her last couple visits with him so I can see what our next step should be. She is in agreement and plans to come by to sign records release forms for Dr. Marge Duncans office tomorrow.

## 2021-10-18 ENCOUNTER — Other Ambulatory Visit: Payer: Self-pay | Admitting: Student

## 2021-10-25 ENCOUNTER — Telehealth: Payer: Self-pay | Admitting: Student

## 2021-10-25 NOTE — Telephone Encounter (Signed)
Called patient to discuss famotidine.  Patient feels that her increase of famotidine from 20 mg once daily daily to 20 mg twice daily has caused her to have back pain, leg swelling, decreased appetite.  She would prefer to cut back down to 1 tablet daily.  Advised patient that if her symptoms do not improve she needs to make an appointment to be evaluated for her symptoms.  ED precautions given.  Also advised patient that in addition to taking time she can get over-the-counter Gaviscon to use multiple times a day and especially before lying down at night to help with her acid reflux.

## 2021-10-25 NOTE — Telephone Encounter (Signed)
Patient states that she started taking the new dose of famotidine (2 tabs daily) and has since noticed a change in her apetite and not being hungry.  She is also experiencing leg swelling and back problems.  Patient is concerned that this is coming from the increase of her medication.  Will forward to MD.  Advised patient to not take another dose until she hears from Dr. Yetta Barre.  Pt voiced understanding.   Franchon Ketterman,CMA

## 2021-11-01 NOTE — Telephone Encounter (Signed)
Spoke with Ivin Booty at [facility] and they will fax over a copy of the office notes.

## 2021-11-21 DIAGNOSIS — M25561 Pain in right knee: Secondary | ICD-10-CM | POA: Insufficient documentation

## 2021-12-05 ENCOUNTER — Other Ambulatory Visit: Payer: Self-pay

## 2021-12-05 ENCOUNTER — Emergency Department (HOSPITAL_COMMUNITY): Payer: 59

## 2021-12-05 ENCOUNTER — Emergency Department (HOSPITAL_COMMUNITY)
Admission: EM | Admit: 2021-12-05 | Discharge: 2021-12-05 | Disposition: A | Discharge status: Home / Self Care | Payer: 59 | Attending: Emergency Medicine | Admitting: Emergency Medicine

## 2021-12-05 DIAGNOSIS — K625 Hemorrhage of anus and rectum: Secondary | ICD-10-CM | POA: Insufficient documentation

## 2021-12-05 DIAGNOSIS — R1030 Lower abdominal pain, unspecified: Secondary | ICD-10-CM | POA: Diagnosis present

## 2021-12-05 LAB — CBC WITH DIFFERENTIAL/PLATELET
Abs Immature Granulocytes: 0.01 10*3/uL (ref 0.00–0.07)
Basophils Absolute: 0 10*3/uL (ref 0.0–0.1)
Basophils Relative: 1 %
Eosinophils Absolute: 0.1 10*3/uL (ref 0.0–0.5)
Eosinophils Relative: 2 %
HCT: 33.6 % — ABNORMAL LOW (ref 36.0–46.0)
Hemoglobin: 11.3 g/dL — ABNORMAL LOW (ref 12.0–15.0)
Immature Granulocytes: 0 %
Lymphocytes Relative: 14 %
Lymphs Abs: 0.6 10*3/uL — ABNORMAL LOW (ref 0.7–4.0)
MCH: 31.4 pg (ref 26.0–34.0)
MCHC: 33.6 g/dL (ref 30.0–36.0)
MCV: 93.3 fL (ref 80.0–100.0)
Monocytes Absolute: 0.4 10*3/uL (ref 0.1–1.0)
Monocytes Relative: 10 %
Neutro Abs: 2.8 10*3/uL (ref 1.7–7.7)
Neutrophils Relative %: 73 %
Platelets: 246 10*3/uL (ref 150–400)
RBC: 3.6 MIL/uL — ABNORMAL LOW (ref 3.87–5.11)
RDW: 15 % (ref 11.5–15.5)
WBC: 3.8 10*3/uL — ABNORMAL LOW (ref 4.0–10.5)
nRBC: 0 % (ref 0.0–0.2)

## 2021-12-05 LAB — POC OCCULT BLOOD, ED: Fecal Occult Bld: POSITIVE — AB

## 2021-12-05 LAB — COMPREHENSIVE METABOLIC PANEL
ALT: 11 U/L (ref 0–44)
AST: 20 U/L (ref 15–41)
Albumin: 3.7 g/dL (ref 3.5–5.0)
Alkaline Phosphatase: 50 U/L (ref 38–126)
Anion gap: 12 (ref 5–15)
BUN: 20 mg/dL (ref 8–23)
CO2: 25 mmol/L (ref 22–32)
Calcium: 9.9 mg/dL (ref 8.9–10.3)
Chloride: 104 mmol/L (ref 98–111)
Creatinine, Ser: 1.54 mg/dL — ABNORMAL HIGH (ref 0.44–1.00)
GFR, Estimated: 31 mL/min — ABNORMAL LOW (ref >60)
Glucose, Bld: 118 mg/dL — ABNORMAL HIGH (ref 70–99)
Potassium: 4.1 mmol/L (ref 3.5–5.1)
Sodium: 141 mmol/L (ref 135–145)
Total Bilirubin: 0.6 mg/dL (ref 0.3–1.2)
Total Protein: 6.8 g/dL (ref 6.5–8.1)

## 2021-12-05 LAB — PROTIME-INR
INR: 1 (ref 0.8–1.2)
Prothrombin Time: 13.2 seconds (ref 11.4–15.2)

## 2021-12-05 LAB — TYPE AND SCREEN
ABO/RH(D): B POS
Antibody Screen: NEGATIVE

## 2021-12-05 NOTE — ED Notes (Signed)
Patient given water

## 2021-12-05 NOTE — ED Provider Triage Note (Signed)
Emergency Medicine Provider Triage Evaluation Note  [name] , a 88 y.o. female  was evaluated in triage.  Pt complains of lower abdominal pain with associated bloody diarrhea with dark maroon-colored stool for the last 2 days.  History of diverticular bleed in the past though she did not have pain at that time.  No fevers or chills.  No increase in baseline shortness of breath.  Review of Systems  Positive: As above Negative: As above  Physical Exam  BP 112/68 (BP Location: Right Arm)   Pulse 71   Temp 98.1 F (36.7 C) (Oral)   Resp 16   SpO2 100%  Gen:   Awake, no distress   Resp:  Normal effort  MSK:   Moves extremities without difficulty  Other:  Abdomen generally soft nondistended nontender. GU exam deferred in triage.  Medical Decision Making  Medically screening exam initiated at 4:56 AM.  Appropriate orders placed.  VALECIA BESKE was informed that the remainder of the evaluation will be completed by another provider, this initial triage assessment does not replace that evaluation, and the importance of remaining in the ED until their evaluation is complete.  This chart was dictated using voice recognition software, Dragon. Despite the best efforts of this provider to proofread and correct errors, errors may still occur which can change documentation meaning.    Emeline Darling, PA-C 12/05/21 [phone]

## 2021-12-05 NOTE — ED Triage Notes (Signed)
Patient reports pain across lower abdomen with bloody stools onset yesterday , no fever or vomiting .

## 2021-12-05 NOTE — ED Provider Notes (Signed)
[facility] EMERGENCY DEPARTMENT Provider Note   CSN: [id number] Arrival date & time: 12/05/21  0423     History Chief Complaint  Patient presents with   Bloody Stools / Abdominal Pain     HPI [name] is a 88 y.o. female presenting for bloody stools.  She states that yesterday at about 10 PM she had a moderate amount of bright red blood inside of her bowel movement.  She has a history of diverticulosis.  She states the bleeding has now stopped.  Unfortunately she had a 7-hour wait prior to being seen.  She denies fevers or chills, nausea vomiting, syncope shortness of breath.  Her symptoms have now resolved.  She does not have lightheadedness.  She would like to be discharged to go eat lunch.  Patient's recorded medical, surgical, social, medication list and allergies were reviewed in the Snapshot window as part of the initial history.   Review of Systems   Review of Systems  Constitutional:  Negative for chills and fever.  HENT:  Negative for ear pain and sore throat.   Eyes:  Negative for pain and visual disturbance.  Respiratory:  Negative for cough and shortness of breath.   Cardiovascular:  Negative for chest pain and palpitations.  Gastrointestinal:  Positive for abdominal pain and blood in stool. Negative for vomiting.  Genitourinary:  Negative for dysuria and hematuria.  Musculoskeletal:  Negative for arthralgias and back pain.  Skin:  Negative for color change and rash.  Neurological:  Negative for seizures and syncope.  All other systems reviewed and are negative.   Physical Exam Updated Vital Signs BP (!) 152/108 (BP Location: Right Arm)   Pulse 67   Temp (!) 97.4 F (36.3 C) (Oral)   Resp 18   Ht 5\' 6"  (1.676 m)   Wt 83.9 kg   SpO2 100%   BMI 29.86 kg/m  Physical Exam Vitals and nursing note reviewed.  Constitutional:      General: She is not in acute distress.    Appearance: She is well-developed.  HENT:     Head:  Normocephalic and atraumatic.  Eyes:     Conjunctiva/sclera: Conjunctivae normal.  Cardiovascular:     Rate and Rhythm: Normal rate and regular rhythm.     Heart sounds: No murmur heard. Pulmonary:     Effort: Pulmonary effort is normal. No respiratory distress.     Breath sounds: Normal breath sounds.  Abdominal:     Palpations: Abdomen is soft.     Tenderness: There is no abdominal tenderness.  Genitourinary:    Comments: Bright red blood on rectal exam.  Internal hemorrhoids appreciated on exam. Musculoskeletal:        General: No swelling.     Cervical back: Neck supple.  Skin:    General: Skin is warm and dry.     Capillary Refill: Capillary refill takes less than 2 seconds.  Neurological:     Mental Status: She is alert.  Psychiatric:        Mood and Affect: Mood normal.      ED Course/ Medical Decision Making/ A&P    Procedures Procedures   Medications Ordered in ED Medications - No data to display  Medical Decision Making:    KAYDE ATKERSON is a 88 y.o. female who presented to the ED today with bright red blood per rectum detailed above.     Patient's presentation is complicated by their history of comorbid medical problems, advanced age.  Patient placed on continuous vitals and telemetry monitoring while in ED which was reviewed periodically.   Complete initial physical exam performed, notably the patient  was hemodynamically stable in no acute distress, active bright red blood on her rectal exam at this time.      Reviewed and confirmed nursing documentation for past medical history, family history, social history.    Initial Assessment:   Patient history of present illness and physical exam findings are most consistent with nonspecific hematochezia.  Likely internal hemorrhoids as the etiology, though diverticular bleed remains on the differential versus AVM or other vascular malformation. Had a prolonged shared medical decision making with patient who  would like to be discharged.  She was willing to undergo lab work-up in triage as below   initial Plan:  Screening labs including CBC and Metabolic panel to evaluate for infectious or metabolic etiology of disease.  Coagulation profile screening CT head ordered in triage, uncertain utility in this case Objective evaluation as below reviewed with plan for close reassessment  Initial Study Results:    Radiology  All images reviewed independently. Agree with radiology report at this time.   CT HEAD WO CONTRAST (5MM)  Result Date: 12/05/2021 CLINICAL DATA:  88 year old female. Anemia and hyperglycemia. Lower abdominal pain. Bloody diarrhea. EXAM: CT HEAD WITHOUT CONTRAST TECHNIQUE: Contiguous axial images were obtained from the base of the skull through the vertex without intravenous contrast. RADIATION DOSE REDUCTION: This exam was performed according to the departmental dose-optimization program which includes automated exposure control, adjustment of the mA and/or kV according to patient size and/or use of iterative reconstruction technique. COMPARISON:  None Available. FINDINGS: Brain: No acute intracranial hemorrhage. No focal mass lesion. No CT evidence of acute infarction. No midline shift or mass effect. No hydrocephalus. Basilar cisterns are patent. There are periventricular and subcortical white matter hypodensities. Generalized cortical atrophy. Vascular: No hyperdense vessel or unexpected calcification. Skull: Normal. Negative for fracture or focal lesion. Sinuses/Orbits: Paranasal sinuses and mastoid air cells are clear. Orbits are clear. Other: None. IMPRESSION: 1. No acute intracranial findings. 2. Atrophy and microvascular disease. Electronically Signed   By: Suzy Bouchard M.D.   On: 12/05/2021 09:31     Final Assessment and Plan:   Patient's history of present on his physical exam findings are most consistent with nonspecific hematochezia without symptomatic anemia.  I recommended  hospital observation due to history of diverticular bleed, however patient would like to be discharged.  Discussed risk of recurrence and even progression including hemorrhagic shock and potentially death.  Patient expressed understanding and stated that she has already arranged follow-up with gastroenterology in the outpatient setting.  Given otherwise well appearance, this is reasonable at this time.  Patient discharged with strict return precautions in no acute distress.   Clinical Impression:  1. BRBPR (bright red blood per rectum)      Discharge   Final Clinical Impression(s) / ED Diagnoses Final diagnoses:  BRBPR (bright red blood per rectum)    Rx / DC Orders ED Discharge Orders     None         Tretha Sciara, MD 12/05/21 1115

## 2021-12-05 NOTE — Discharge Instructions (Addendum)
You are seen today for bright red blood per rectum.  Your evaluation is grossly reassuring today.  However you do have evidence of blood in your rectal wall.  This is likely hemorrhoidal bleeding but you do have a risk for diverticular hemorrhage which may have resulted in substantial threat to life.  I have recommended that you be observed today, however you would prefer to be discharged to follow-up in the outpatient setting with your primary gastroenterologist which is also reasonable.

## 2021-12-18 ENCOUNTER — Other Ambulatory Visit: Payer: Self-pay | Admitting: Cardiovascular Disease

## 2021-12-21 ENCOUNTER — Other Ambulatory Visit: Payer: Self-pay | Admitting: Family Medicine

## 2021-12-21 ENCOUNTER — Other Ambulatory Visit: Payer: Self-pay | Admitting: Student

## 2021-12-21 DIAGNOSIS — R109 Unspecified abdominal pain: Secondary | ICD-10-CM

## 2021-12-21 DIAGNOSIS — R634 Abnormal weight loss: Secondary | ICD-10-CM

## 2021-12-21 DIAGNOSIS — K625 Hemorrhage of anus and rectum: Secondary | ICD-10-CM

## 2021-12-21 NOTE — Telephone Encounter (Signed)
Patient walked in requesting a prescription for nitroglycerin.

## 2022-01-01 ENCOUNTER — Encounter: Payer: Self-pay | Admitting: Student

## 2022-01-01 ENCOUNTER — Ambulatory Visit (INDEPENDENT_AMBULATORY_CARE_PROVIDER_SITE_OTHER): Payer: 59 | Admitting: Student

## 2022-01-01 VITALS — BP 132/66 | HR 91 | Temp 98.0°F | Ht 63.0 in | Wt 177.0 lb

## 2022-01-01 DIAGNOSIS — Z23 Encounter for immunization: Secondary | ICD-10-CM | POA: Diagnosis not present

## 2022-01-01 DIAGNOSIS — K219 Gastro-esophageal reflux disease without esophagitis: Secondary | ICD-10-CM | POA: Diagnosis not present

## 2022-01-01 DIAGNOSIS — R071 Chest pain on breathing: Secondary | ICD-10-CM | POA: Diagnosis not present

## 2022-01-01 DIAGNOSIS — K921 Melena: Secondary | ICD-10-CM | POA: Diagnosis not present

## 2022-01-01 NOTE — Patient Instructions (Signed)
It was great to see you! Thank you for allowing me to participate in your care!  I recommend that you always bring your medications to each appointment as this makes it easy to ensure you are on the correct medications and helps [place] not miss when refills are needed.  Our plans for today:  -Continue to follow-up with your GI doctor after your CT scan -If you have a large amount of blood from your rectum, I recommend going to the emergency department -If the pain behind her breast does not resolve or increases, please return for further evaluation -Return in about 4 months for a follow-up visit  Take care and seek immediate care sooner if you develop any concerns.   Dr. Erick Alley, DO [facility]

## 2022-01-01 NOTE — Progress Notes (Unsigned)
    SUBJECTIVE:   CHIEF COMPLAINT / HPI:   R sided chest pain Pt complains of sharp pain that feels like its behind her R breast when she takes a deep breath for past 2 days. States it has improved greatly but she still feels it a little. She is SOB but at her baseline. No cough.   Blood in stools Seen in ED on 10/24 for blood in stool which resolved. They advised pt to see GI doctor. Per Pt GI doctor prdered her a CT scan which she will have done on 12/07.   PERTINENT  PMH / PSH: ***  OBJECTIVE:   Vitals:   01/01/22 1411  BP: 132/66  Pulse: 91  Temp: 98 F (36.7 C)  SpO2: 100%     General: NAD, pleasant, able to participate in exam Cardiac: RRR, no murmurs. Respiratory: CTAB, normal effort, No wheezes, rales or rhonchi Abdomen: Bowel sounds present, nontender, nondistended, no hepatosplenomegaly. Extremities: no edema or cyanosis. Skin: warm and dry, no rashes noted Neuro: alert, no obvious focal deficits Psych: Normal affect and mood  ASSESSMENT/PLAN:   No problem-specific Assessment & Plan notes found for this encounter.     Dr. Erick Alley, DO [facility]    {    This will disappear when note is signed, click to select method of visit    :1}

## 2022-01-02 NOTE — Assessment & Plan Note (Signed)
Patient now off of famotidine and only using Gaviscon which is working well for her.

## 2022-01-02 NOTE — Assessment & Plan Note (Signed)
Patient currently does not have blood in stool, this has resolved since her ED visit and hemoglobin was stable then.  Most likely due to either hemorrhoids or diverticulosis which patient is known to have.  She is following with GI.  My assumption is the CT scan is likely a virtual colonoscopy as a colonoscopy was attempted several years ago and unable to be completed due to severe diverticulosis.  Patient should continue to follow with GI.

## 2022-01-02 NOTE — Assessment & Plan Note (Signed)
Chest pain only associated with breathing, not reproducible, only on the right side behind the breast, vital signs stable with no tachycardia, good oxygen saturation, normal work of breathing, and pain has greatly resolved over the last 2 days.  I am not concerned for MI, PE at this time.  Pain could be MSK in nature or even gas pains.  As pain has greatly improved patient advised to monitor pain and return if it worsens or she develops other symptoms.  No need for EKG, trops or imaging at this time.

## 2022-01-18 ENCOUNTER — Other Ambulatory Visit: Payer: 59

## 2022-01-22 ENCOUNTER — Ambulatory Visit
Admission: RE | Admit: 2022-01-22 | Discharge: 2022-01-22 | Disposition: A | Discharge status: Home / Self Care | Payer: 59 | Source: Ambulatory Visit | Attending: Family Medicine | Admitting: Family Medicine

## 2022-01-22 DIAGNOSIS — K625 Hemorrhage of anus and rectum: Secondary | ICD-10-CM

## 2022-01-22 DIAGNOSIS — R109 Unspecified abdominal pain: Secondary | ICD-10-CM

## 2022-01-22 DIAGNOSIS — R634 Abnormal weight loss: Secondary | ICD-10-CM

## 2022-02-15 ENCOUNTER — Other Ambulatory Visit: Payer: Self-pay

## 2022-02-15 NOTE — Patient Outreach (Signed)
Aging Gracefully Program  02/15/2022  [name] January 16, 1924 [id number]   [facility] Evaluation Interviewer made contact with patient. Aging Gracefully 9 month survey completed.     [place] Management Assistant [phone]

## 2022-03-21 ENCOUNTER — Telehealth: Payer: Self-pay | Admitting: Student

## 2022-03-21 NOTE — Telephone Encounter (Signed)
Left message for patient to call back and schedule Medicare Annual Wellness Visit (AWV) .   Please offer to do virtually or by telephone.  Left office number and my jabber [phone].  AWVI  eligible as of 03/15/2017   Please schedule at anytime with Nurse Health Advisor.

## 2022-04-10 ENCOUNTER — Other Ambulatory Visit: Payer: Self-pay | Admitting: Student

## 2022-04-10 ENCOUNTER — Ambulatory Visit (INDEPENDENT_AMBULATORY_CARE_PROVIDER_SITE_OTHER): Payer: 59 | Admitting: Student

## 2022-04-10 ENCOUNTER — Encounter: Payer: Self-pay | Admitting: Student

## 2022-04-10 VITALS — BP 120/60 | HR 83 | Ht 63.0 in | Wt 186.6 lb

## 2022-04-10 DIAGNOSIS — K5790 Diverticulosis of intestine, part unspecified, without perforation or abscess without bleeding: Secondary | ICD-10-CM

## 2022-04-10 DIAGNOSIS — K921 Melena: Secondary | ICD-10-CM | POA: Diagnosis not present

## 2022-04-10 DIAGNOSIS — Z0000 Encounter for general adult medical examination without abnormal findings: Secondary | ICD-10-CM | POA: Diagnosis not present

## 2022-04-10 DIAGNOSIS — M1711 Unilateral primary osteoarthritis, right knee: Secondary | ICD-10-CM

## 2022-04-10 DIAGNOSIS — M545 Low back pain, unspecified: Secondary | ICD-10-CM | POA: Diagnosis not present

## 2022-04-10 DIAGNOSIS — M170 Bilateral primary osteoarthritis of knee: Secondary | ICD-10-CM

## 2022-04-10 HISTORY — DX: Unilateral primary osteoarthritis, right knee: M17.11

## 2022-04-10 MED ORDER — PROAIR HFA 108 (90 BASE) MCG/ACT IN AERS: 1.0000 | INHALATION_SPRAY | RESPIRATORY_TRACT | 1 refills | Status: DC | PRN

## 2022-04-10 MED ORDER — NITROGLYCERIN 0.4 MG SL SUBL: 0.4000 mg | SUBLINGUAL_TABLET | SUBLINGUAL | 0 refills | Status: DC | PRN

## 2022-04-10 NOTE — Progress Notes (Signed)
    SUBJECTIVE:   CHIEF COMPLAINT / HPI:   Blood in stool Likely d/t diverticulosis, follows with GI and was put on vit. C and iron. Stool is dark since taking iron but hasn't noticed pink toilet water since last week.   Knee OA Right worse than left. Has been using voltaren gel on her knees once daily, it helps a little but wares off. Also taking tylenol 650 mg, 1 tablet once a day. Was going to [facility] but stopped d/t high co pay.  They did a steroid injection in October 2023 and had plans for her to return in 6 weeks for possible viscosupplementation.  Steroid injection helped a little but pain is back.  Back pain  Has multilevel disc degeneration.  Saw [facility] 12/14/2021 for gluteal trigger point injection.  Was given back brace at [facility] but she doesn't think it helps.  There was talk at the last visit of injection therapy in the future.  Considered PT but pt states getting there will be an issue.    OBJECTIVE:   Vitals:   04/10/22 1522  BP: 120/60  Pulse: 83  SpO2: 96%     General: NAD, pleasant, able to participate in exam Cardiac: Well-perfused Respiratory: Breathing comfortably on room air Extremities: Mild edema of bilateral knees, right worse than left with no erythema.  No pain with palpation of bilateral knees. Skin: warm and dry Neuro: alert, no obvious focal deficits Psych: Normal affect and mood  ASSESSMENT/PLAN:   Diverticulosis Patient to continue vitamin C and iron per GI. -Continue follow-up with GI -CBC to monitor hemoglobin  Lumbar back pain Chronic and due to disc degeneration.  There is not much I will be able to do about her back pain.  Offered PT referral however she declines.  Advised patient to increase Tylenol to 650 mg every 6 hours, told her she can further increase to 1000 mg if needed.  If pain significantly worsens, we can consider low-dose narcotics as needed and she can always go back to [facility].   Osteoarthritis of  both knees Advised patient to increase Tylenol to 650 mg every 6 hours, can further increase to 1000 mg if needed and to increase Voltaren gel to 4 times a day.  Return as needed, can consider steroid injections and/or low-dose narcotics as needed in the future     Dr. Precious Gilding, [place]

## 2022-04-10 NOTE — Assessment & Plan Note (Signed)
Patient to continue vitamin C and iron per GI. -Continue follow-up with GI -CBC to monitor hemoglobin

## 2022-04-10 NOTE — Patient Instructions (Addendum)
It was great to see you! Thank you for allowing me to participate in your care!  I recommend that you always bring your medications to each appointment as this makes it easy to ensure you are on the correct medications and helps [place] not miss when refills are needed.  Our plans for today:  - Increase tylenol to 650 mg 1 tablet every 6 hours and increase Voltaren gel to four times a day - Return as needed, we can give steroid injections if needed or try other medications in the future   We are checking some labs today, I will call you if they are abnormal will send you a MyChart message or a letter if they are normal.  If you do not hear about your labs in the next 2 weeks please let us know.  Take care and seek immediate care sooner if you develop any concerns.   Dr. Precious Gilding, DO [facility]

## 2022-04-10 NOTE — Assessment & Plan Note (Addendum)
Chronic and due to disc degeneration.  There is not much I will be able to do about her back pain.  Offered PT referral however she declines.  Advised patient to increase Tylenol to 650 mg every 6 hours, told her she can further increase to 1000 mg if needed.  If pain significantly worsens, we can consider low-dose narcotics as needed and she can always go back to [facility].

## 2022-04-10 NOTE — Assessment & Plan Note (Signed)
Advised patient to increase Tylenol to 650 mg every 6 hours, can further increase to 1000 mg if needed and to increase Voltaren gel to 4 times a day.  Return as needed, can consider steroid injections and/or low-dose narcotics as needed in the future

## 2022-04-11 ENCOUNTER — Other Ambulatory Visit: Payer: Self-pay | Admitting: Cardiovascular Disease

## 2022-04-11 ENCOUNTER — Other Ambulatory Visit: Payer: Self-pay | Admitting: Student

## 2022-04-11 DIAGNOSIS — R0602 Shortness of breath: Secondary | ICD-10-CM

## 2022-04-11 LAB — CBC
Hematocrit: 33 % — ABNORMAL LOW (ref 34.0–46.6)
Hemoglobin: 10.7 g/dL — ABNORMAL LOW (ref 11.1–15.9)
MCH: 30 pg (ref 26.6–33.0)
MCHC: 32.4 g/dL (ref 31.5–35.7)
MCV: 92 fL (ref 79–97)
Platelets: 225 10*3/uL (ref 150–450)
RBC: 3.57 x10E6/uL — ABNORMAL LOW (ref 3.77–5.28)
RDW: 13.9 % (ref 11.7–15.4)
WBC: 3.3 10*3/uL — ABNORMAL LOW (ref 3.4–10.8)

## 2022-04-11 MED ORDER — ALBUTEROL SULFATE HFA 108 (90 BASE) MCG/ACT IN AERS: 1.0000 | INHALATION_SPRAY | Freq: Four times a day (QID) | RESPIRATORY_TRACT | 1 refills | Status: DC | PRN

## 2022-06-12 ENCOUNTER — Ambulatory Visit (HOSPITAL_COMMUNITY)
Admission: RE | Admit: 2022-06-12 | Discharge: 2022-06-12 | Disposition: A | Discharge status: Home / Self Care | Payer: 59 | Source: Ambulatory Visit | Attending: Family Medicine | Admitting: Family Medicine

## 2022-06-12 ENCOUNTER — Ambulatory Visit (INDEPENDENT_AMBULATORY_CARE_PROVIDER_SITE_OTHER): Payer: 59 | Admitting: Student

## 2022-06-12 ENCOUNTER — Encounter: Payer: Self-pay | Admitting: Student

## 2022-06-12 VITALS — BP 126/64 | HR 88 | Wt 178.0 lb

## 2022-06-12 DIAGNOSIS — M1711 Unilateral primary osteoarthritis, right knee: Secondary | ICD-10-CM | POA: Diagnosis not present

## 2022-06-12 DIAGNOSIS — R079 Chest pain, unspecified: Secondary | ICD-10-CM

## 2022-06-12 DIAGNOSIS — M25561 Pain in right knee: Secondary | ICD-10-CM

## 2022-06-12 MED ORDER — METHYLPREDNISOLONE ACETATE 80 MG/ML IJ SUSP
80.0000 mg | Freq: Once | INTRAMUSCULAR | Status: AC
Start: 2022-06-12 — End: 2022-06-12
  Administered 2022-06-12: 80 mg via INTRAMUSCULAR

## 2022-06-12 NOTE — Progress Notes (Signed)
    SUBJECTIVE:   CHIEF COMPLAINT / HPI:   OA of R knee Patient last seen on 04/10/2022 for knee OA advised take Tylenol 650 mg every 6 hours and increase to 1000 mg if needed.  Also advised to increase Voltaren gel to 4 times daily.  Today she states she has been using these but still having significant pain and would like a steroid injection  Shoulder/chest pain On 4/18 Had some pain in R shoulder pain and R chest pain worse and sharp with deep breaths which resolved after taking a nap. A few days later it came back, she took her nitro and rested and it eased off and went away. She thought about calling an ambulance but didn't because she was worried about transportation after an ED visit and then the pain resolved anyway.  She has no shortness of breath or chest pain at this time and is feeling like her normal self.  PERTINENT  PMH / PSH: CAD, HTN, OA  OBJECTIVE:   BP 126/64   Pulse 88   Wt 178 lb (80.7 kg)   SpO2 91%   BMI 31.53 kg/m    General: NAD, pleasant, able to participate in exam Cardiac: RRR, systolic murmur present Respiratory: CTAB, normal effort, No wheezes, rales or rhonchi Extremities: No edema, warmth, or erythema of right knee Skin: warm and dry, no rashes noted Neuro: alert, no obvious focal deficits Psych: Normal affect and mood  ASSESSMENT/PLAN:   PROCEDURE: INJECTION: Patient was given informed consent, signed copy in the chart. Appropriate time out was taken. Area prepped with Betadine and alcohol swabs.  Freeze spray was used for local anesthesia. A 21 gauge 1 1/2 inch needle was used. 2 cc of methylprednisolone 40 mg/ml plus  1 cc of 1% lidocaine without epinephrine was injected into the R knee.  The patient tolerated the procedure well. There were no complications. Post procedure instructions were given.  Osteoarthritis of right knee Steroid injection performed today. Patient advised to continue with Tylenol and Voltaren gel as needed  Chest  pain It is reassuring that pain resolved on its own and she currently has no symptoms. I have low concern for PE at this time as she is not tachycardic and not currently having pleuritic chest pain.  EKG unchanged from prior and no findings concerning for MI.  Patient had similar chest pain in November 2023 which was also on the right side and associated with breathing.  Could be MSK related or gas pains.  Return and ED precautions given.   Patient to return in about 2 weeks to follow-up chest pain and discuss any other issues if desired  Dr. Erick Alley, DO  [facility]

## 2022-06-12 NOTE — Patient Instructions (Addendum)
It was great to see you! Thank you for allowing me to participate in your care!  I recommend that you always bring your medications to each appointment as this makes it easy to ensure you are on the correct medications and helps [place] not miss when refills are needed.  Our plans for today:  -Continue current medications. Keep using tylenol and Voltaren gel for pain. I hope the steroid injection helps! - If chest pain returns and is persistent or you develop shortness of breath or pain with breathing, take nitro and call EMS - Return in about 2 weeks to follow up and talk about any other issues you would like to discuss   Take care and seek immediate care sooner if you develop any concerns.   Dr. Erick Alley, DO [facility]

## 2022-06-14 NOTE — Assessment & Plan Note (Signed)
It is reassuring that pain resolved on its own and she currently has no symptoms. I have low concern for PE at this time as she is not tachycardic and not currently having pleuritic chest pain.  EKG unchanged from prior and no findings concerning for MI.  Patient had similar chest pain in November 2023 which was also on the right side and associated with breathing.  Could be MSK related or gas pains.  Return and ED precautions given.

## 2022-06-14 NOTE — Assessment & Plan Note (Addendum)
Steroid injection performed today. Patient advised to continue with Tylenol and Voltaren gel as needed

## 2022-07-07 ENCOUNTER — Other Ambulatory Visit: Payer: Self-pay | Admitting: Student

## 2022-07-07 ENCOUNTER — Other Ambulatory Visit: Payer: Self-pay | Admitting: Cardiovascular Disease

## 2022-07-20 ENCOUNTER — Encounter: Payer: Self-pay | Admitting: Student

## 2022-07-20 ENCOUNTER — Ambulatory Visit (INDEPENDENT_AMBULATORY_CARE_PROVIDER_SITE_OTHER): Payer: 59 | Admitting: Student

## 2022-07-20 VITALS — BP 128/62 | HR 63 | Resp 20 | Ht 63.0 in | Wt 181.8 lb

## 2022-07-20 DIAGNOSIS — D509 Iron deficiency anemia, unspecified: Secondary | ICD-10-CM

## 2022-07-20 DIAGNOSIS — K5790 Diverticulosis of intestine, part unspecified, without perforation or abscess without bleeding: Secondary | ICD-10-CM

## 2022-07-20 DIAGNOSIS — K921 Melena: Secondary | ICD-10-CM | POA: Diagnosis not present

## 2022-07-20 DIAGNOSIS — D649 Anemia, unspecified: Secondary | ICD-10-CM

## 2022-07-20 DIAGNOSIS — R079 Chest pain, unspecified: Secondary | ICD-10-CM | POA: Diagnosis not present

## 2022-07-20 DIAGNOSIS — I951 Orthostatic hypotension: Secondary | ICD-10-CM | POA: Insufficient documentation

## 2022-07-20 DIAGNOSIS — N1832 Chronic kidney disease, stage 3b: Secondary | ICD-10-CM

## 2022-07-20 DIAGNOSIS — Z23 Encounter for immunization: Secondary | ICD-10-CM

## 2022-07-20 DIAGNOSIS — N912 Amenorrhea, unspecified: Secondary | ICD-10-CM

## 2022-07-20 HISTORY — DX: Orthostatic hypotension: I95.1

## 2022-07-20 MED ORDER — ZOSTER VAC RECOMB ADJUVANTED 50 MCG/0.5ML IM SUSR: 0.5000 mL | Freq: Once | INTRAMUSCULAR | 0 refills | Status: AC

## 2022-07-20 MED ORDER — FERROUS SULFATE 325 (65 FE) MG PO TBEC
325.0000 mg | DELAYED_RELEASE_TABLET | Freq: Every day | ORAL | 3 refills | Status: AC
Start: 2022-07-20 — End: ?

## 2022-07-20 NOTE — Patient Instructions (Addendum)
It was great to see you! Thank you for allowing me to participate in your care!  I recommend that you always bring your medications to each appointment as this makes it easy to ensure you are on the correct medications and helps [place] not miss when refills are needed.  Our plans for today:  -I sent in a prescription for iron supplements.  If your labs show that you do not need to take these, I will let you know. -You received your COVID-vaccine today -You can go to the pharmacy to get your Shingrix vaccine when you are ready.  Keep in mind that it may make you feel like you have a cold for about a day -I strongly recommend making an appointment ASAP with your cardiologist to discuss your chest pressure and shortness of breath -I recommend stopping amlodipine because you are feeling dizzy and your blood pressure drops when you stand up -Please return in about 2 weeks to check blood pressure and discuss the need for a hospital bed    We are checking some labs today, I will call you if they are abnormal will send you a MyChart message or a letter if they are normal.  If you do not hear about your labs in the next 2 weeks please let us know.  Take care and seek immediate care sooner if you develop any concerns.   Dr. Erick Alley, DO [facility]

## 2022-07-20 NOTE — Progress Notes (Signed)
    SUBJECTIVE:   CHIEF COMPLAINT / HPI:   Chest pressure Still having chest pressure and SOB when lying flat but can happen when sitting up.  It is also worse with exertion. She takes nitro and it helps. She does follow with cardiology.  Also notes intermittent dizziness for past couple weeks. She has aortic valve stenosis and CAD. EKG at last visit was non concerning for MI.   Rectal bleeding Chronic issue due to diverticulosis.  Patient follows with GI.  Has not seen large amounts BRBPR lately but does note sometimes the water is pink after having a BM.  Anemia Likely related to blood loss with rectal bleeding.  Could also be component of anemia of chronic disease.  She is currently taking low-dose iron supplement and would like to increase this.   CKD 3 Kidney function has been stable over the last few years.  Patient would like to check today.   PERTINENT  PMH / PSH: Diverticulosis, CAD, aortic stenosis, CKD  OBJECTIVE:   BP 128/62 (BP Location: Left Arm, Patient Position: Sitting, Cuff Size: Normal)   Pulse 63   Resp 20   Ht 5\' 3"  (1.6 m)   Wt 181 lb 12.8 oz (82.5 kg)   SpO2 100%   BMI 32.20 kg/m    General: NAD, pleasant, able to participate in exam Cardiac: RRR, systolic murmur Respiratory: CTAB, normal effort, No wheezes, rales or rhonchi Extremities: no edema or cyanosis. Skin: warm and dry Neuro: alert, no obvious focal deficits Psych: Normal affect and mood  ASSESSMENT/PLAN:   Chest pain Chest pain chronic and unchanged since last visit.  She is advised to call her cardiologist to schedule an appointment ASAP  Diverticulosis Known chronic problem causing rectal bleeding, follows with GI. will obtain CBC to monitor hemoglobin as she is noticing pink toilet water on occasion after having BM.  CKD (chronic kidney disease) stage 3, GFR 30-59 ml/min (HCC) BMP today to monitor kidney function per patient request.  Has been stable over the last few  years.  [place] Patient request to increase iron supplement to higher dose.  I am not sure if this will benefit her as she could have anemia of chronic disease.  Will obtain CBC, iron, ferritin, TIBC.  I did send a prescription for ferrous sulfate 325 mg daily per patient request as I do not think increasing dose will harm her  Orthostatic hypotension Orthostatic vitals positive today, could be cause of intermittent dizziness.  Will D/C amlodipine 5 mg and recheck blood pressure in 2 weeks.   Health maintenance COVID booster given today Prescription for shingles vaccine provided Patient to return in about 2 weeks to check blood pressure and discuss potential need for hospital bed per patient request   Dr. Erick Alley, DO [facility]

## 2022-07-20 NOTE — Assessment & Plan Note (Signed)
BMP today to monitor kidney function per patient request.  Has been stable over the last few years.

## 2022-07-20 NOTE — Progress Notes (Signed)
Patient states that she is still experiencing chest pressure.

## 2022-07-20 NOTE — Assessment & Plan Note (Signed)
Chest pain chronic and unchanged since last visit.  She is advised to call her cardiologist to schedule an appointment ASAP

## 2022-07-20 NOTE — Assessment & Plan Note (Signed)
Patient request to increase iron supplement to higher dose.  I am not sure if this will benefit her as she could have anemia of chronic disease.  Will obtain CBC, iron, ferritin, TIBC.  I did send a prescription for ferrous sulfate 325 mg daily per patient request as I do not think increasing dose will harm her

## 2022-07-20 NOTE — Assessment & Plan Note (Signed)
Known chronic problem causing rectal bleeding, follows with GI. will obtain CBC to monitor hemoglobin as she is noticing pink toilet water on occasion after having BM.

## 2022-07-20 NOTE — Assessment & Plan Note (Signed)
>>  ASSESSMENT AND PLAN FOR AMENIA WRITTEN ON 07/20/2022  2:20 PM BY Erick Alley, DO  Patient request to increase iron supplement to higher dose.  I am not sure if this will benefit her as she could have anemia of chronic disease.  Will obtain CBC, iron, ferritin, TIBC.  I did send a prescription for ferrous sulfate 325 mg daily per patient request as I do not think increasing dose will harm her

## 2022-07-20 NOTE — Assessment & Plan Note (Signed)
Orthostatic vitals positive today, could be cause of intermittent dizziness.  Will D/C amlodipine 5 mg and recheck blood pressure in 2 weeks.

## 2022-07-21 LAB — CBC
Hematocrit: 31.5 % — ABNORMAL LOW (ref 34.0–46.6)
Hemoglobin: 10.1 g/dL — ABNORMAL LOW (ref 11.1–15.9)
MCH: 29.5 pg (ref 26.6–33.0)
MCHC: 32.1 g/dL (ref 31.5–35.7)
MCV: 92 fL (ref 79–97)
Platelets: 348 10*3/uL (ref 150–450)
RBC: 3.42 x10E6/uL — ABNORMAL LOW (ref 3.77–5.28)
RDW: 14.6 % (ref 11.7–15.4)
WBC: 3.3 10*3/uL — ABNORMAL LOW (ref 3.4–10.8)

## 2022-07-21 LAB — BASIC METABOLIC PANEL
BUN/Creatinine Ratio: 17 (ref 12–28)
BUN: 25 mg/dL (ref 10–36)
CO2: 24 mmol/L (ref 20–29)
Calcium: 9.7 mg/dL (ref 8.7–10.3)
Chloride: 104 mmol/L (ref 96–106)
Creatinine, Ser: 1.47 mg/dL — ABNORMAL HIGH (ref 0.57–1.00)
Glucose: 93 mg/dL (ref 70–99)
Potassium: 4.5 mmol/L (ref 3.5–5.2)
Sodium: 141 mmol/L (ref 134–144)
eGFR: 32 mL/min/{1.73_m2} — ABNORMAL LOW (ref >59)

## 2022-07-21 LAB — IRON,TIBC AND FERRITIN PANEL
Ferritin: 88 ng/mL (ref 15–150)
Iron Saturation: 25 % (ref 15–55)
Iron: 71 ug/dL (ref 27–139)
Total Iron Binding Capacity: 281 ug/dL (ref 250–450)
UIBC: 210 ug/dL (ref 118–369)

## 2022-08-02 ENCOUNTER — Encounter: Payer: Self-pay | Admitting: Student

## 2022-08-02 ENCOUNTER — Ambulatory Visit (INDEPENDENT_AMBULATORY_CARE_PROVIDER_SITE_OTHER): Payer: 59 | Admitting: Student

## 2022-08-02 VITALS — BP 121/65 | HR 79 | Ht 63.0 in | Wt 185.6 lb

## 2022-08-02 DIAGNOSIS — I509 Heart failure, unspecified: Secondary | ICD-10-CM

## 2022-08-02 DIAGNOSIS — I10 Essential (primary) hypertension: Secondary | ICD-10-CM

## 2022-08-02 DIAGNOSIS — G8929 Other chronic pain: Secondary | ICD-10-CM

## 2022-08-02 HISTORY — DX: Heart failure, unspecified: I50.9

## 2022-08-02 HISTORY — DX: Other chronic pain: G89.29

## 2022-08-02 NOTE — Progress Notes (Signed)
    SUBJECTIVE:   CHIEF COMPLAINT / HPI:   HTN Taken off Amlodipine at last visit d/t orthostatic hypotension. Here for BP check.   Request for Hospital bed Has to prop her head and feet up on pillows and pads d/t heart failure and acid reflux. Has very difficult time finding a comfortable position d/t chronic pain caused by OA and we are very limited in what we can do for her pain due to comorbidities and age. Sometimes has to sleep in a char because she cannot tolerate lying in her bed.   PERTINENT  PMH / PSH: HTN, orthostatic hypotension, OA, HFpEF  OBJECTIVE:   BP 121/65   Pulse 79   Ht 5\' 3"  (1.6 m)   Wt 185 lb 9.6 oz (84.2 kg)   SpO2 97%   BMI 32.88 kg/m    General: NAD, pleasant, able to participate in exam Cardiac: Well-perfused Respiratory: Breathing comfortably on room air Neuro: alert, no obvious focal deficits Psych: Normal affect and mood  ASSESSMENT/PLAN:   Essential hypertension Amlodipine D/C'd.  No need for blood pressure medication at this time.  BP well-controlled off of meds.  Other chronic pain Pain d/t severe OA.  Pain is very difficult to treat due to age and comorbidities.  Hospital bed would be appropriate for this patient to help her sleep in a comfortable position so she does not have to sleep in her recliner and will be helpful in setting of HFpEF so she can prop up head and feet easily and adequately. -DME order placed for hospital bed      Dr. Erick Alley, DO [facility]

## 2022-08-02 NOTE — Assessment & Plan Note (Signed)
Amlodipine D/C'd.  No need for blood pressure medication at this time.  BP well-controlled off of meds.

## 2022-08-02 NOTE — Assessment & Plan Note (Addendum)
Pain d/t severe OA.  Pain is very difficult to treat due to age and comorbidities.  Hospital bed would be appropriate for this patient to help her sleep in a comfortable position so she does not have to sleep in her recliner and will be helpful in setting of HFpEF so she can prop up head and feet easily and adequately. -DME order placed for hospital bed

## 2022-08-02 NOTE — Patient Instructions (Signed)
It was great to see you! Thank you for allowing me to participate in your care!  Our plans for today:  - I will work on ordering a hospital bed. We will see if insurance will approve this - Your blood pressure looks great today so you can stay OFF of amlodipine.  -Return in 3 months or sooner if needed   Take care and seek immediate care sooner if you develop any concerns.   Dr. Erick Alley, DO [facility]

## 2022-08-06 ENCOUNTER — Telehealth: Payer: Self-pay

## 2022-08-06 NOTE — Telephone Encounter (Signed)
Community message sent to [employer] for hospital bed. Will await response.   Abigail Marsiglia C Mali Eppard, RN  

## 2022-08-06 NOTE — Telephone Encounter (Signed)
Receipt confirmed by [employer].   Sanvika Cuttino C Raiyah Speakman, RN  

## 2022-09-13 ENCOUNTER — Other Ambulatory Visit: Payer: Self-pay

## 2022-09-13 MED ORDER — ISOSORBIDE MONONITRATE ER 120 MG PO TB24: 120.0000 mg | ORAL_TABLET | Freq: Every day | ORAL | 0 refills | Status: DC

## 2022-09-13 MED ORDER — FUROSEMIDE 20 MG PO TABS: 20.0000 mg | ORAL_TABLET | Freq: Every day | ORAL | 0 refills | Status: DC | PRN

## 2022-09-14 ENCOUNTER — Other Ambulatory Visit: Payer: Self-pay | Admitting: *Deleted

## 2022-09-14 DIAGNOSIS — R0602 Shortness of breath: Secondary | ICD-10-CM

## 2022-09-14 MED ORDER — NITROGLYCERIN 0.4 MG SL SUBL: 0.4000 mg | SUBLINGUAL_TABLET | SUBLINGUAL | 0 refills | Status: AC | PRN

## 2022-09-14 MED ORDER — ALBUTEROL SULFATE HFA 108 (90 BASE) MCG/ACT IN AERS
1.0000 | INHALATION_SPRAY | Freq: Four times a day (QID) | RESPIRATORY_TRACT | 1 refills | Status: DC | PRN
Start: 2022-09-14 — End: 2022-10-01

## 2022-09-17 ENCOUNTER — Encounter: Payer: Self-pay | Admitting: Cardiovascular Disease

## 2022-09-17 NOTE — Progress Notes (Unsigned)
Cardiology Office Note:    Date:  09/18/2022   ID:  [name], DOB 1924-02-06, MRN [id number]  PCP:  Erick Alley, DO  Cardiologist:  Kristeen Miss, MD  Electrophysiologist:  None   Referring MD: Erick Alley, DO   Chief Complaint  Patient presents with   Aortic Stenosis   Hypertension              [name] is a 89 y.o. female with a hx of mild aortic stenosis, chronic kidney disease stage III, hypertension and history of GI bleed.  She has minimal coronary artery disease by heart catheterization in 2003.  She was recently admitted to the hospital this past summer with episodes of chest pain.  She was thought to be a very poor candidate for invasive/interventional procedures.  She was seen for a return office visit by Lizabeth Leyden, nurse practitioner on October 23, 2017.  Seems to be going better.    The chest pain has improved on medical therapy  Has occasional palpitations  Occasional left leg swelling   Oct. 27, 2021:  [name] is a 89 year old female with a history of mild aortic stenosis, chronic kidney disease, hypertension and history of GI bleed.  She has minimal coronary artery disease by heart catheterization in 2003.  She is seen for follow-up visit today.  BP is a bit elevated today .  She checks her BP at home.  Is typically normal at home  No cp, Had some palpitations. Wore an event monitor  Showed NSR , sinus bradycardia,  Rare episode of nonsustained VT ( 4 beat run of NS VT)  No evidence of afib    Sept, 12, 2022:  [name] is seen for follow up of her AS, CKD, HTN and GI bleed.  Has no complaints Has a 1st degree AV block - will be reducing her metoprolol to 12. 5 BID    September 18, 2021:  [name] is seen today for follow-up of her aortic stenosis, chronic kidney disease, hypertension and GI bleed.  VSS  No cp,   Has noticed that her HR was slow at one point  Feels weak at times Will DC metoprolol and HCTZ  Event monitor showed brief  runs of NSVT ( 4 beats)    Aug. 6, 2024 [name] is seen for follow up of her Aortic stenosis, CKD, HTN, GI bleed  Has good days and bad days  Had some palpitations  Took several SL NTG   We had a monitor placed in October, 2021.  During that time she was found to have some very brief episodes of nonsustained ventricular tachycardia. Will start toprol XL 25 mg a day   Recheck echocardiogram to assess her LV function and aortic stenosis.  Past Medical History:  Diagnosis Date   Acid reflux    takes Nexium daily   Anemia    takes Ferrous Sulfate daily   Aortic stenosis    mild AS pk grad 18, mean grad 9, AVA 1.32 cm 03/2015 echo (Dr. Jacinto Halim)   Arthritis    Chronic kidney disease (CKD), stage III (moderate) (HCC)    Diverticulitis    Hematochezia 03/2015   High cholesterol    can't take the meds   History of blood transfusion 03/2015   no abnormal reaction noted   History of GI diverticular bleed    HTN (hypertension)    takes Losartan-HCTZ and Metoprolol daily   Joint pain    Joint swelling    Nocturia  Numbness    in legs   Peripheral edema    occasionally   Shortness of breath dyspnea    occasionally and with exertion   Slow urinary stream    at times    Past Surgical History:  Procedure Laterality Date   ABCESS DRAINAGE     abdominal abcess   cataract surgery Bilateral    CHOLECYSTECTOMY     DILATION AND CURETTAGE OF UTERUS     ESOPHAGOGASTRODUODENOSCOPY N/A 02/23/2015   Procedure: ESOPHAGOGASTRODUODENOSCOPY (EGD);  Surgeon: Carman Ching, MD;  Location: [facility] ENDOSCOPY;  Service: Endoscopy;  Laterality: N/A;   FLEXIBLE SIGMOIDOSCOPY N/A 02/24/2015   Procedure: FLEXIBLE SIGMOIDOSCOPY;  Surgeon: Carman Ching, MD;  Location: [facility] ENDOSCOPY;  Service: Endoscopy;  Laterality: N/A;   IR ANGIOGRAM SELECTIVE EACH ADDITIONAL VESSEL  03/06/2017   IR ANGIOGRAM SELECTIVE EACH ADDITIONAL VESSEL  03/06/2017   IR ANGIOGRAM SELECTIVE EACH ADDITIONAL VESSEL  03/06/2017   IR ANGIOGRAM  SELECTIVE EACH ADDITIONAL VESSEL  03/08/2017   IR ANGIOGRAM VISCERAL SELECTIVE  03/06/2017   IR ANGIOGRAM VISCERAL SELECTIVE  03/06/2017   IR ANGIOGRAM VISCERAL SELECTIVE  03/06/2017   IR ANGIOGRAM VISCERAL SELECTIVE  03/08/2017   IR ANGIOGRAM VISCERAL SELECTIVE  03/08/2017   IR EMBO ART  VEN HEMORR LYMPH EXTRAV  INC GUIDE ROADMAPPING  03/06/2017   IR EMBO ART  VEN HEMORR LYMPH EXTRAV  INC GUIDE ROADMAPPING  03/08/2017   IR US GUIDE VASC ACCESS RIGHT  03/06/2017   IR US GUIDE VASC ACCESS RIGHT  03/08/2017   left knee surgery     TOTAL HIP ARTHROPLASTY Left 05/17/2015   Procedure: LEFT TOTAL HIP ARTHROPLASTY ANTERIOR APPROACH;  Surgeon: Kathryne Hitch, MD;  Location: [facility] OR;  Service: Orthopedics;  Laterality: Left;    Current Medications: Current Meds  Medication Sig   acetaminophen (TYLENOL) 650 MG CR tablet Take 650 mg by mouth daily.   albuterol (VENTOLIN HFA) 108 (90 Base) MCG/ACT inhaler Inhale 1-2 puffs into the lungs every 6 (six) hours as needed for wheezing or shortness of breath.   Alum Hydroxide-Mag Carbonate (GAVISCON PO) Take by mouth.   Ascorbic Acid (VITAMIN C) 1000 MG tablet Take 1,000 mg by mouth daily.   Calcium Carbonate Antacid (GAVISCON ACID BRKTHRGH FORMULA PO) Take 1 tablet by mouth as needed.   Cholecalciferol (VITAMIN D3) 2000 units TABS Take 2,000 Units by mouth daily.   ferrous sulfate 325 (65 FE) MG EC tablet Take 1 tablet (325 mg total) by mouth daily with breakfast.   furosemide (LASIX) 20 MG tablet Take 1 tablet (20 mg total) by mouth daily as needed for edema.   isosorbide mononitrate (IMDUR) 120 MG 24 hr tablet Take 1 tablet (120 mg total) by mouth daily.   metoprolol succinate (TOPROL XL) 25 MG 24 hr tablet Take 1 tablet (25 mg total) by mouth daily.   Multiple Vitamin (MULITIVITAMIN WITH MINERALS) TABS Take 1 tablet by mouth daily.   nitroGLYCERIN (NITROSTAT) 0.4 MG SL tablet Place 1 tablet (0.4 mg total) under the tongue every 5 (five) minutes as needed for  chest pain.   polyvinyl alcohol (LIQUIFILM TEARS) 1.4 % ophthalmic solution Place 1 drop into both eyes as needed for dry eyes.     Allergies:   Ezetimibe, Lipitor [atorvastatin], Pravachol [pravastatin], Statins, Zocor [simvastatin], and Cholestyramine   Social History   Socioeconomic History   Marital status: Widowed    Spouse name: Not on file   Number of children: Not on file   Years of  education: Not on file   Highest education level: Not on file  Occupational History   Not on file  Tobacco Use   Smoking status: Former    Passive exposure: Past   Smokeless tobacco: Never   Tobacco comments:    quit smoking 8+ yrs ago  Vaping Use   Vaping status: Never Used  Substance and Sexual Activity   Alcohol use: No    Comment: quit yrs ago   Drug use: No   Sexual activity: Not Currently  Other Topics Concern   Not on file  Social History Narrative   Not on file   Social Determinants of Health   Financial Resource Strain: Low Risk  (09/30/2017)   Overall Financial Resource Strain (CARDIA)    Difficulty of Paying Living Expenses: Not very hard  Food Insecurity: Unknown (09/30/2017)   Hunger Vital Sign    Worried About Running Out of Food in the Last Year: Patient declined    Ran Out of Food in the Last Year: Patient declined  Transportation Needs: Unknown (09/30/2017)   PRAPARE - Administrator, Civil Service (Medical): Patient declined    Lack of Transportation (Non-Medical): Patient declined  Physical Activity: Insufficiently Active (09/30/2017)   Exercise Vital Sign    Days of Exercise per Week: 1 day    Minutes of Exercise per Session: 10 min  Stress: No Stress Concern Present (09/30/2017)   [employer] of Occupational Health - Occupational Stress Questionnaire    Feeling of Stress : Only a little  Social Connections: Somewhat Isolated (09/30/2017)   Social Connection and Isolation Panel [NHANES]    Frequency of Communication with Friends and Family:  More than three times a week    Frequency of Social Gatherings with Friends and Family: Three times a week    Attends Religious Services: 1 to 4 times per year    Active Member of Clubs or Organizations: No    Attends Banker Meetings: Never    Marital Status: Widowed     Family History: The patient's family history includes Cancer in an other family member; Diabetes in an other family member; Heart disease in an other family member; Hypertension in an other family member.  ROS:   Please see the history of present illness.     All other systems reviewed and are negative.  EKGs/Labs/Other Studies Reviewed:    The following studies were reviewed today:   EKG:  EKG Interpretation Date/Time:  Tuesday September 18 2022 15:55:31 EDT Ventricular Rate:  66 PR Interval:  320 QRS Duration:  142 QT Interval:  434 QTC Calculation: 454 R Axis:   91  Text Interpretation: Sinus rhythm with marked sinus arrhythmia with 1st degree A-V block Rightward axis Non-specific intra-ventricular conduction block Cannot rule out Septal infarct (cited on or before 29-May-2021) T wave abnormality, consider inferolateral ischemia When compared with ECG of 12-Jun-2022 16:03, Questionable change in initial forces of Anterior leads Confirmed by Kristeen Miss [phone]) on 09/18/2022 4:24:37 PM    Recent Labs: 12/05/2021: ALT 11 07/20/2022: BUN 25; Creatinine, Ser 1.47; Hemoglobin 10.1; Platelets 348; Potassium 4.5; Sodium 141  Recent Lipid Panel    Component Value Date/Time   CHOL (H) 01/06/2009 0149    209        ATP III CLASSIFICATION:  <200     mg/dL   Desirable  191-478  mg/dL   Borderline High  >=295    mg/dL   High  TRIG 51 01/06/2009 0149   HDL 56 01/06/2009 0149   CHOLHDL 3.7 01/06/2009 0149   VLDL 10 01/06/2009 0149   LDLCALC (H) 01/06/2009 0149    143        Total Cholesterol/HDL:CHD Risk Coronary Heart Disease Risk Table                     Men   Women  1/2 Average Risk    3.4   3.3  Average Risk       5.0   4.4  2 X Average Risk   9.6   7.1  3 X Average Risk  23.4   11.0        Use the calculated Patient Ratio above and the CHD Risk Table to determine the patient's CHD Risk.        ATP III CLASSIFICATION (LDL):  <100     mg/dL   Optimal  829-562  mg/dL   Near or Above                    Optimal  130-159  mg/dL   Borderline  130-865  mg/dL   High  >784     mg/dL   Very High    Physical Exam:     Physical Exam: Blood pressure (!) 140/70, pulse 77, height 5\' 3"  (1.6 m), weight 184 lb 6.4 oz (83.6 kg), SpO2 100%.  HYPERTENSION CONTROL Vitals:   09/18/22 1540 09/18/22 1626  BP: (!) 140/72 (!) 140/70    The patient's blood pressure is elevated above target today.  In order to address the patient's elevated BP: A new medication was prescribed today.       GEN: Elderly, mildly obese female, in no acute distress HEENT: Normal NECK: No JVD; No carotid bruits LYMPHATICS: No lymphadenopathy CARDIAC: RRR soft systolic murmur. RESPIRATORY:  Clear to auscultation without rales, wheezing or rhonchi  ABDOMEN: Soft, non-tender, non-distended MUSCULOSKELETAL:  No edema; No deformity  SKIN: Warm and dry NEUROLOGIC:  Alert and oriented x 3     ASSESSMENT:    1. Aortic valve stenosis, etiology of cardiac valve disease unspecified   2. Non-sustained ventricular tachycardia (HCC)      PLAN:       Chest pain : She still has some unusual sensations in her chest.  They do not sound like angina.  She takes nitroglycerin on occasion.  Will recheck echocardiogram.  They did not sound severe enough to warrant stress testing but we will keep an eye on this.  2.  Mild aortic stenosis: Will recheck an echocardiogram.    3.  Hypertension:     Blood pressure is well-controlled.  4.   Palpitations: She had an event monitor 3 years ago which revealed episodes of nonsustained ventricular tachycardia.  She may be having more ventricular tachycardia now.   Will start her on Toprol-XL 25 mg a day.  Will see her again in 6 months for follow-up.     Medication Adjustments/Labs and Tests Ordered: Current medicines are reviewed at length with the patient today.  Concerns regarding medicines are outlined above.  Orders Placed This Encounter  Procedures   EKG 12-Lead   ECHOCARDIOGRAM COMPLETE    Meds ordered this encounter  Medications   metoprolol succinate (TOPROL XL) 25 MG 24 hr tablet    Sig: Take 1 tablet (25 mg total) by mouth daily.    Dispense:  90 tablet    Refill:  3  Patient Instructions  Medication Instructions:  START Metoprolol Succinate/Toprol XL 25mg  daily *If you need a refill on your cardiac medications before your next appointment, please call your pharmacy*  Lab Work: NONE If you have labs (blood work) drawn today and your tests are completely normal, you will receive your results only by: MyChart Message (if you have MyChart) OR A paper copy in the mail If you have any lab test that is abnormal or we need to change your treatment, we will call you to review the results.  Testing/Procedures: ECHO Your physician has requested that you have an echocardiogram. Echocardiography is a painless test that uses sound waves to create images of your heart. It provides your doctor with information about the size and shape of your heart and how well your heart's chambers and valves are working. This procedure takes approximately one hour. There are no restrictions for this procedure. Please do NOT wear cologne, perfume, aftershave, or lotions (deodorant is allowed). Please arrive 15 minutes prior to your appointment time.  Follow-Up: At [facility], you and your health needs are our priority.  As part of our continuing mission to provide you with exceptional heart care, we have created designated Provider Care Teams.  These Care Teams include your primary Cardiologist (physician) and Advanced Practice Providers  (APPs -  Physician Assistants and Nurse Practitioners) who all work together to provide you with the care you need, when you need it.  We recommend signing up for the patient portal called "MyChart".  Sign up information is provided on this After Visit Summary.  MyChart is used to connect with patients for Virtual Visits (Telemedicine).  Patients are able to view lab/test results, encounter notes, upcoming appointments, etc.  Non-urgent messages can be sent to your provider as well.   To learn more about what you can do with MyChart, go to [email or web address].    Your next appointment:   6 month(s)  Provider:   Kristeen Miss, MD        Signed, Kristeen Miss, MD  09/18/2022 4:26 PM    [facility]

## 2022-09-18 ENCOUNTER — Ambulatory Visit: Payer: 59 | Attending: Cardiovascular Disease | Admitting: Cardiovascular Disease

## 2022-09-18 ENCOUNTER — Encounter: Payer: Self-pay | Admitting: Cardiovascular Disease

## 2022-09-18 VITALS — BP 140/70 | HR 77 | Ht 63.0 in | Wt 184.4 lb

## 2022-09-18 DIAGNOSIS — I350 Nonrheumatic aortic (valve) stenosis: Secondary | ICD-10-CM | POA: Diagnosis not present

## 2022-09-18 DIAGNOSIS — I4729 Other ventricular tachycardia: Secondary | ICD-10-CM | POA: Diagnosis not present

## 2022-09-18 MED ORDER — METOPROLOL SUCCINATE ER 25 MG PO TB24
25.0000 mg | ORAL_TABLET | Freq: Every day | ORAL | 3 refills | Status: DC
Start: 2022-09-18 — End: 2022-09-24

## 2022-09-18 NOTE — Patient Instructions (Signed)
Medication Instructions:  START Metoprolol Succinate (Toprol XL) 25mg  daily *If you need a refill on your cardiac medications before your next appointment, please call your pharmacy*  Lab Work: NONE If you have labs (blood work) drawn today and your tests are completely normal, you will receive your results only by: [place] (if you have MyChart) OR A paper copy in the mail If you have any lab test that is abnormal or we need to change your treatment, we will call you to review the results.  Testing/Procedures: ECHO Your physician has requested that you have an echocardiogram. Echocardiography is a painless test that uses sound waves to create images of your heart. It provides your doctor with information about the size and shape of your heart and how well your heart's chambers and valves are working. This procedure takes approximately one hour. There are no restrictions for this procedure. Please do NOT wear cologne, perfume, aftershave, or lotions (deodorant is allowed). Please arrive 15 minutes prior to your appointment time.  Follow-Up: At [facility], you and your health needs are our priority.  As part of our continuing mission to provide you with exceptional heart care, we have created designated Provider Care Teams.  These Care Teams include your primary Cardiologist (physician) and Advanced Practice Providers (APPs -  Physician Assistants and Nurse Practitioners) who all work together to provide you with the care you need, when you need it.  We recommend signing up for the patient portal called "MyChart".  Sign up information is provided on this After Visit Summary.  MyChart is used to connect with patients for Virtual Visits (Telemedicine).  Patients are able to view lab/test results, encounter notes, upcoming appointments, etc.  Non-urgent messages can be sent to your provider as well.   To learn more about what you can do with MyChart, go to [email or web address].     Your next appointment:   6 month(s)  Provider:   Mertie Moores, MD

## 2022-09-20 ENCOUNTER — Encounter (HOSPITAL_COMMUNITY): Payer: Self-pay

## 2022-09-20 ENCOUNTER — Other Ambulatory Visit: Payer: Self-pay

## 2022-09-20 ENCOUNTER — Inpatient Hospital Stay (HOSPITAL_COMMUNITY)
Admission: EM | Admit: 2022-09-20 | Discharge: 2022-09-24 | DRG: 357 | Disposition: A | Discharge status: Home Health | Payer: 59 | Attending: Family Medicine | Admitting: Family Medicine

## 2022-09-20 ENCOUNTER — Emergency Department (HOSPITAL_COMMUNITY): Payer: 59

## 2022-09-20 ENCOUNTER — Telehealth: Payer: Self-pay | Admitting: Cardiology

## 2022-09-20 DIAGNOSIS — I509 Heart failure, unspecified: Secondary | ICD-10-CM

## 2022-09-20 DIAGNOSIS — D62 Acute posthemorrhagic anemia: Secondary | ICD-10-CM | POA: Diagnosis present

## 2022-09-20 DIAGNOSIS — K449 Diaphragmatic hernia without obstruction or gangrene: Secondary | ICD-10-CM | POA: Diagnosis present

## 2022-09-20 DIAGNOSIS — N183 Chronic kidney disease, stage 3 unspecified: Secondary | ICD-10-CM | POA: Diagnosis present

## 2022-09-20 DIAGNOSIS — K5731 Diverticulosis of large intestine without perforation or abscess with bleeding: Secondary | ICD-10-CM | POA: Diagnosis present

## 2022-09-20 DIAGNOSIS — Z888 Allergy status to other drugs, medicaments and biological substances status: Secondary | ICD-10-CM | POA: Diagnosis not present

## 2022-09-20 DIAGNOSIS — Z8249 Family history of ischemic heart disease and other diseases of the circulatory system: Secondary | ICD-10-CM

## 2022-09-20 DIAGNOSIS — I130 Hypertensive heart and chronic kidney disease with heart failure and stage 1 through stage 4 chronic kidney disease, or unspecified chronic kidney disease: Secondary | ICD-10-CM | POA: Diagnosis present

## 2022-09-20 DIAGNOSIS — I350 Nonrheumatic aortic (valve) stenosis: Secondary | ICD-10-CM | POA: Diagnosis present

## 2022-09-20 DIAGNOSIS — Z96642 Presence of left artificial hip joint: Secondary | ICD-10-CM | POA: Diagnosis present

## 2022-09-20 DIAGNOSIS — R001 Bradycardia, unspecified: Secondary | ICD-10-CM | POA: Diagnosis present

## 2022-09-20 DIAGNOSIS — K219 Gastro-esophageal reflux disease without esophagitis: Secondary | ICD-10-CM | POA: Diagnosis present

## 2022-09-20 DIAGNOSIS — R519 Headache, unspecified: Secondary | ICD-10-CM | POA: Diagnosis not present

## 2022-09-20 DIAGNOSIS — Z87891 Personal history of nicotine dependence: Secondary | ICD-10-CM

## 2022-09-20 DIAGNOSIS — I10 Essential (primary) hypertension: Secondary | ICD-10-CM | POA: Diagnosis present

## 2022-09-20 DIAGNOSIS — I959 Hypotension, unspecified: Secondary | ICD-10-CM | POA: Diagnosis not present

## 2022-09-20 DIAGNOSIS — I2510 Atherosclerotic heart disease of native coronary artery without angina pectoris: Secondary | ICD-10-CM | POA: Diagnosis present

## 2022-09-20 DIAGNOSIS — I5042 Chronic combined systolic (congestive) and diastolic (congestive) heart failure: Secondary | ICD-10-CM | POA: Diagnosis present

## 2022-09-20 DIAGNOSIS — K921 Melena: Secondary | ICD-10-CM

## 2022-09-20 DIAGNOSIS — K922 Gastrointestinal hemorrhage, unspecified: Secondary | ICD-10-CM | POA: Diagnosis not present

## 2022-09-20 DIAGNOSIS — M1990 Unspecified osteoarthritis, unspecified site: Secondary | ICD-10-CM | POA: Diagnosis present

## 2022-09-20 DIAGNOSIS — Z8719 Personal history of other diseases of the digestive system: Secondary | ICD-10-CM

## 2022-09-20 DIAGNOSIS — K625 Hemorrhage of anus and rectum: Secondary | ICD-10-CM | POA: Diagnosis present

## 2022-09-20 DIAGNOSIS — Z66 Do not resuscitate: Secondary | ICD-10-CM | POA: Diagnosis present

## 2022-09-20 DIAGNOSIS — Z79899 Other long term (current) drug therapy: Secondary | ICD-10-CM | POA: Diagnosis not present

## 2022-09-20 DIAGNOSIS — E7800 Pure hypercholesterolemia, unspecified: Secondary | ICD-10-CM | POA: Diagnosis present

## 2022-09-20 HISTORY — PX: IR US GUIDE VASC ACCESS LEFT: IMG2389

## 2022-09-20 HISTORY — PX: IR ANGIOGRAM VISCERAL SELECTIVE: IMG657

## 2022-09-20 HISTORY — PX: IR EMBO ART  VEN HEMORR LYMPH EXTRAV  INC GUIDE ROADMAPPING: IMG5450

## 2022-09-20 HISTORY — PX: IR FLUORO GUIDE CV LINE LEFT: IMG2282

## 2022-09-20 HISTORY — PX: IR US GUIDE VASC ACCESS RIGHT: IMG2390

## 2022-09-20 LAB — TYPE AND SCREEN
ABO/RH(D): B POS
Antibody Screen: NEGATIVE
Unit division: 0
Unit division: 0
Unit division: 0

## 2022-09-20 LAB — POC OCCULT BLOOD, ED: Fecal Occult Bld: POSITIVE — AB

## 2022-09-20 LAB — COMPREHENSIVE METABOLIC PANEL
ALT: 13 U/L (ref 0–44)
AST: 18 U/L (ref 15–41)
Albumin: 3 g/dL — ABNORMAL LOW (ref 3.5–5.0)
Alkaline Phosphatase: 40 U/L (ref 38–126)
Anion gap: 11 (ref 5–15)
BUN: 23 mg/dL (ref 8–23)
CO2: 22 mmol/L (ref 22–32)
Calcium: 8.9 mg/dL (ref 8.9–10.3)
Chloride: 108 mmol/L (ref 98–111)
Creatinine, Ser: 1.49 mg/dL — ABNORMAL HIGH (ref 0.44–1.00)
GFR, Estimated: 32 mL/min — ABNORMAL LOW (ref >60)
Glucose, Bld: 103 mg/dL — ABNORMAL HIGH (ref 70–99)
Potassium: 4.4 mmol/L (ref 3.5–5.1)
Sodium: 141 mmol/L (ref 135–145)
Total Bilirubin: 0.4 mg/dL (ref 0.3–1.2)
Total Protein: 5.5 g/dL — ABNORMAL LOW (ref 6.5–8.1)

## 2022-09-20 LAB — CBC WITH DIFFERENTIAL/PLATELET
Abs Immature Granulocytes: 0.01 10*3/uL (ref 0.00–0.07)
Abs Immature Granulocytes: 0.02 10*3/uL (ref 0.00–0.07)
Basophils Absolute: 0 10*3/uL (ref 0.0–0.1)
Basophils Absolute: 0 10*3/uL (ref 0.0–0.1)
Basophils Relative: 1 %
Basophils Relative: 0 %
Eosinophils Absolute: 0.2 10*3/uL (ref 0.0–0.5)
Eosinophils Absolute: 0.1 10*3/uL (ref 0.0–0.5)
Eosinophils Relative: 4 %
Eosinophils Relative: 1 %
HCT: 28.4 % — ABNORMAL LOW (ref 36.0–46.0)
HCT: 27.8 % — ABNORMAL LOW (ref 36.0–46.0)
Hemoglobin: 9 g/dL — ABNORMAL LOW (ref 12.0–15.0)
Hemoglobin: 8.8 g/dL — ABNORMAL LOW (ref 12.0–15.0)
Immature Granulocytes: 0 %
Immature Granulocytes: 0 %
Lymphocytes Relative: 19 %
Lymphocytes Relative: 20 %
Lymphs Abs: 0.7 10*3/uL (ref 0.7–4.0)
Lymphs Abs: 1.2 10*3/uL (ref 0.7–4.0)
MCH: 30.4 pg (ref 26.0–34.0)
MCH: 29.9 pg (ref 26.0–34.0)
MCHC: 31.7 g/dL (ref 30.0–36.0)
MCHC: 31.7 g/dL (ref 30.0–36.0)
MCV: 95.9 fL (ref 80.0–100.0)
MCV: 94.6 fL (ref 80.0–100.0)
Monocytes Absolute: 0.3 10*3/uL (ref 0.1–1.0)
Monocytes Absolute: 0.4 10*3/uL (ref 0.1–1.0)
Monocytes Relative: 9 %
Monocytes Relative: 6 %
Neutro Abs: 2.6 10*3/uL (ref 1.7–7.7)
Neutro Abs: 4.5 10*3/uL (ref 1.7–7.7)
Neutrophils Relative %: 67 %
Neutrophils Relative %: 73 %
Platelets: 200 10*3/uL (ref 150–400)
Platelets: 172 10*3/uL (ref 150–400)
RBC: 2.96 MIL/uL — ABNORMAL LOW (ref 3.87–5.11)
RBC: 2.94 MIL/uL — ABNORMAL LOW (ref 3.87–5.11)
RDW: 15.2 % (ref 11.5–15.5)
RDW: 15.6 % — ABNORMAL HIGH (ref 11.5–15.5)
WBC: 3.8 10*3/uL — ABNORMAL LOW (ref 4.0–10.5)
WBC: 6.1 10*3/uL (ref 4.0–10.5)
nRBC: 0 % (ref 0.0–0.2)
nRBC: 0 % (ref 0.0–0.2)

## 2022-09-20 LAB — I-STAT CHEM 8, ED
BUN: 27 mg/dL — ABNORMAL HIGH (ref 8–23)
Calcium, Ion: 1.08 mmol/L — ABNORMAL LOW (ref 1.15–1.40)
Chloride: 108 mmol/L (ref 98–111)
Creatinine, Ser: 1.6 mg/dL — ABNORMAL HIGH (ref 0.44–1.00)
Glucose, Bld: 99 mg/dL (ref 70–99)
HCT: 26 % — ABNORMAL LOW (ref 36.0–46.0)
Hemoglobin: 8.8 g/dL — ABNORMAL LOW (ref 12.0–15.0)
Potassium: 4.5 mmol/L (ref 3.5–5.1)
Sodium: 140 mmol/L (ref 135–145)
TCO2: 24 mmol/L (ref 22–32)

## 2022-09-20 LAB — BPAM RBC
Blood Product Expiration Date: 202409012359
Blood Product Expiration Date: 202409012359
Blood Product Expiration Date: 202409022359
ISSUE DATE / TIME: 202408081158
ISSUE DATE / TIME: 202408081216
ISSUE DATE / TIME: 202408081216
Unit Type and Rh: 7300
Unit Type and Rh: 7300
Unit Type and Rh: 7300

## 2022-09-20 LAB — URINALYSIS, W/ REFLEX TO CULTURE (INFECTION SUSPECTED)
Bilirubin Urine: NEGATIVE
Glucose, UA: NEGATIVE mg/dL
Ketones, ur: NEGATIVE mg/dL
Nitrite: NEGATIVE
Protein, ur: NEGATIVE mg/dL
Specific Gravity, Urine: 1.019 (ref 1.005–1.030)
pH: 5 (ref 5.0–8.0)

## 2022-09-20 LAB — PREPARE RBC (CROSSMATCH)

## 2022-09-20 LAB — TROPONIN I (HIGH SENSITIVITY)
Troponin I (High Sensitivity): 22 ng/L — ABNORMAL HIGH (ref <18)
Troponin I (High Sensitivity): 32 ng/L — ABNORMAL HIGH (ref <18)
Troponin I (High Sensitivity): 29 ng/L — ABNORMAL HIGH (ref <18)
Troponin I (High Sensitivity): 27 ng/L — ABNORMAL HIGH (ref <18)

## 2022-09-20 LAB — HEMOGLOBIN AND HEMATOCRIT, BLOOD
HCT: 29.3 % — ABNORMAL LOW (ref 36.0–46.0)
Hemoglobin: 9.5 g/dL — ABNORMAL LOW (ref 12.0–15.0)

## 2022-09-20 LAB — PROTIME-INR
INR: 1.2 (ref 0.8–1.2)
Prothrombin Time: 15 seconds (ref 11.4–15.2)

## 2022-09-20 LAB — APTT: aPTT: 31 seconds (ref 24–36)

## 2022-09-20 LAB — MAGNESIUM: Magnesium: 1.9 mg/dL (ref 1.7–2.4)

## 2022-09-20 MED ORDER — SODIUM CHLORIDE 0.9% IV SOLUTION: Freq: Once | INTRAVENOUS | Status: DC

## 2022-09-20 MED ORDER — ALBUTEROL SULFATE (2.5 MG/3ML) 0.083% IN NEBU: 3.0000 mL | INHALATION_SOLUTION | Freq: Four times a day (QID) | RESPIRATORY_TRACT | Status: DC | PRN

## 2022-09-20 MED ORDER — ACETAMINOPHEN 325 MG PO TABS
650.0000 mg | ORAL_TABLET | Freq: Four times a day (QID) | ORAL | Status: DC | PRN
  Administered 2022-09-23 – 2022-09-24 (×2): 650 mg via ORAL
  Filled 2022-09-20 (×3): qty 2

## 2022-09-20 MED ORDER — ACETAMINOPHEN 650 MG RE SUPP: 650.0000 mg | Freq: Four times a day (QID) | RECTAL | Status: DC | PRN

## 2022-09-20 MED ORDER — ALUM & MAG HYDROXIDE-SIMETH 200-200-20 MG/5ML PO SUSP
30.0000 mL | Freq: Once | ORAL | Status: AC
  Administered 2022-09-20: 30 mL via ORAL
  Filled 2022-09-20: qty 30

## 2022-09-20 MED ORDER — LIDOCAINE-EPINEPHRINE (PF) 1 %-1:200000 IJ SOLN: 30.0000 mL | Freq: Once | INTRAMUSCULAR | Status: DC

## 2022-09-20 MED ORDER — DICYCLOMINE HCL 10 MG/5ML PO SOLN
10.0000 mg | Freq: Once | ORAL | Status: DC
  Filled 2022-09-20: qty 5

## 2022-09-20 MED ORDER — IOHEXOL 300 MG/ML  SOLN
150.0000 mL | Freq: Once | INTRAMUSCULAR | Status: AC | PRN
  Administered 2022-09-20: 44 mL via INTRAVENOUS

## 2022-09-20 MED ORDER — LIDOCAINE HCL 1 % IJ SOLN
20.0000 mL | Freq: Once | INTRAMUSCULAR | Status: AC
  Administered 2022-09-20: 10 mL via INTRADERMAL

## 2022-09-20 MED ORDER — MIDAZOLAM HCL 2 MG/2ML IJ SOLN
INTRAMUSCULAR | Status: AC
  Filled 2022-09-20: qty 2

## 2022-09-20 MED ORDER — LIDOCAINE-EPINEPHRINE (PF) 1.5 %-1:200000 IJ SOLN
30.0000 mL | Freq: Once | INTRAMUSCULAR | Status: DC
  Filled 2022-09-20 (×3): qty 30

## 2022-09-20 MED ORDER — SODIUM CHLORIDE 0.9% IV SOLUTION: Freq: Once | INTRAVENOUS | Status: AC

## 2022-09-20 MED ORDER — IOHEXOL 300 MG/ML  SOLN
100.0000 mL | Freq: Once | INTRAMUSCULAR | Status: AC | PRN
  Administered 2022-09-20: 1 mL via INTRA_ARTERIAL

## 2022-09-20 MED ORDER — FERROUS SULFATE 325 (65 FE) MG PO TABS
325.0000 mg | ORAL_TABLET | Freq: Every day | ORAL | Status: DC
  Administered 2022-09-21 – 2022-09-24 (×4): 325 mg via ORAL
  Filled 2022-09-20 (×4): qty 1

## 2022-09-20 MED ORDER — LIDOCAINE HCL (PF) 1 % IJ SOLN
INTRAMUSCULAR | Status: AC
  Filled 2022-09-20: qty 30

## 2022-09-20 MED ORDER — FENTANYL CITRATE (PF) 100 MCG/2ML IJ SOLN
INTRAMUSCULAR | Status: AC
  Filled 2022-09-20: qty 2

## 2022-09-20 MED ORDER — IOHEXOL 350 MG/ML SOLN
70.0000 mL | Freq: Once | INTRAVENOUS | Status: AC | PRN
  Administered 2022-09-20: 70 mL via INTRAVENOUS

## 2022-09-20 NOTE — ED Provider Notes (Signed)
[place] EMERGENCY DEPARTMENT AT [facility] Provider Note   CSN: [id number] Arrival date & time: 09/20/22  0720     History  Chief Complaint  Patient presents with   Rectal Bleeding   Abdominal Pain    [name] is a 89 y.o. female.  HPI   Patient is a 89 y.o. female with a hx of mild aortic stenosis, chronic kidney disease stage III, hypertension and history of GI bleed, presenting today for blood in her stool.  She notes that began yesterday afternoon.  She admits to multiple episodes prior to arrival of dark red blood per rectum.  She describes having lower abdominal cramping followed by the urge to have bowel movement.  She will sometimes have stool associated but she is seeing mostly blood.  Her pain does improve after going to the restroom.  Her pain is primarily in her left lower quadrant.  She denies any fevers or chills.  She has no nausea or vomiting.  She denies any lightheadedness or dizziness.  She has no chest pain.  She does have shortness of breath but states that is at her baseline.  She denies taking any blood thinners at home.  Home Medications Prior to Admission medications   Medication Sig Start Date End Date Taking? Authorizing Provider  acetaminophen (TYLENOL) 650 MG CR tablet Take 650 mg by mouth daily.   Yes [provider]  albuterol (VENTOLIN HFA) 108 (90 Base) MCG/ACT inhaler Inhale 1-2 puffs into the lungs every 6 (six) hours as needed for wheezing or shortness of breath. 09/14/22  Yes Erick Alley, DO  Alum Hydroxide-Mag Carbonate (GAVISCON PO) Take 1 tablet by mouth daily as needed (acid reflux).   Yes [provider]  Ascorbic Acid (VITAMIN C) 1000 MG tablet Take 1,000 mg by mouth daily.   Yes [provider]  diclofenac sodium (VOLTAREN) 1 % GEL Apply 2 g topically 2 (two) times daily as needed (pain).   Yes [provider]  ferrous sulfate 325 (65 FE) MG EC tablet Take 1 tablet (325 mg total) by mouth  daily with breakfast. 07/20/22  Yes Erick Alley, DO  isosorbide mononitrate (IMDUR) 120 MG 24 hr tablet Take 1 tablet (120 mg total) by mouth daily. 09/13/22  Yes Nahser, Deloris Ping, MD  metoprolol succinate (TOPROL XL) 25 MG 24 hr tablet Take 1 tablet (25 mg total) by mouth daily. 09/18/22  Yes Nahser, Deloris Ping, MD  Multiple Vitamin (MULITIVITAMIN WITH MINERALS) TABS Take 1 tablet by mouth daily.   Yes [provider]  nitroGLYCERIN (NITROSTAT) 0.4 MG SL tablet Place 1 tablet (0.4 mg total) under the tongue every 5 (five) minutes as needed for chest pain. 09/14/22  Yes Erick Alley, DO  polyvinyl alcohol (LIQUIFILM TEARS) 1.4 % ophthalmic solution Place 1 drop into both eyes as needed for dry eyes.   Yes [provider]  Calcium Carbonate Antacid (GAVISCON ACID BRKTHRGH FORMULA PO) Take 1 tablet by mouth as needed. Patient not taking: Reported on 09/20/2022    [provider]  Cholecalciferol (VITAMIN D3) 2000 units TABS Take 2,000 Units by mouth daily. Patient not taking: Reported on 09/20/2022    [provider]  famotidine (PEPCID) 20 MG tablet Take 20 mg by mouth 2 (two) times daily. Patient not taking: Reported on 09/20/2022 04/12/22   [provider]  furosemide (LASIX) 20 MG tablet Take 1 tablet (20 mg total) by mouth daily as needed for edema. Patient not taking: Reported  on 09/20/2022 09/13/22   Nahser, Deloris Ping, MD  lidocaine (LIDODERM) 5 % Place 1 patch onto the skin daily. Remove & Discard patch within 12 hours or as directed by MD Patient not taking: Reported on 10/10/2021 09/22/20   Erick Alley, DO      Allergies    Ezetimibe, Lipitor [atorvastatin], Pravachol [pravastatin], Statins, Zocor [simvastatin], and Cholestyramine    Review of Systems   Review of Systems Negative except for as noted above in HPI  Physical Exam Updated Vital Signs BP (!) 152/55   Pulse (!) 58   Temp (!) 96 F (35.6 C) (Axillary)   Resp 13   SpO2 96%  Physical  Exam Vitals and nursing note reviewed.  Constitutional:      General: She is not in acute distress.    Appearance: She is well-developed.  HENT:     Head: Normocephalic and atraumatic.  Eyes:     Conjunctiva/sclera: Conjunctivae normal.  Cardiovascular:     Rate and Rhythm: Regular rhythm. Bradycardia present.     Heart sounds: Murmur heard.  Pulmonary:     Effort: Pulmonary effort is normal. No respiratory distress.     Breath sounds: Normal breath sounds. No wheezing or rales.     Comments: Saturating well on room air Abdominal:     General: There is no distension.     Palpations: Abdomen is soft.     Tenderness: There is no abdominal tenderness. There is no right CVA tenderness, left CVA tenderness or guarding.  Genitourinary:    Comments: Dark red blood on rectal exam.  No hemorrhoids seen or palpated. Musculoskeletal:        General: No swelling or tenderness.     Cervical back: Neck supple.  Skin:    General: Skin is warm and dry.     Capillary Refill: Capillary refill takes less than 2 seconds.  Neurological:     Mental Status: She is alert and oriented to person, place, and time.  Psychiatric:        Mood and Affect: Mood normal.     ED Results / Procedures / Treatments   Labs (all labs ordered are listed, but only abnormal results are displayed) Labs Reviewed  CBC WITH DIFFERENTIAL/PLATELET - Abnormal; Notable for the following components:      Result Value   WBC 3.8 (*)    RBC 2.96 (*)    Hemoglobin 9.0 (*)    HCT 28.4 (*)    All other components within normal limits  COMPREHENSIVE METABOLIC PANEL - Abnormal; Notable for the following components:   Glucose, Bld 103 (*)    Creatinine, Ser 1.49 (*)    Total Protein 5.5 (*)    Albumin 3.0 (*)    GFR, Estimated 32 (*)    All other components within normal limits  URINALYSIS, W/ REFLEX TO CULTURE (INFECTION SUSPECTED) - Abnormal; Notable for the following components:   APPearance HAZY (*)    Hgb urine  dipstick MODERATE (*)    Leukocytes,Ua TRACE (*)    Bacteria, UA MANY (*)    All other components within normal limits  CBC WITH DIFFERENTIAL/PLATELET - Abnormal; Notable for the following components:   RBC 2.94 (*)    Hemoglobin 8.8 (*)    HCT 27.8 (*)    RDW 15.6 (*)    All other components within normal limits  HEMOGLOBIN AND HEMATOCRIT, BLOOD - Abnormal; Notable for the following components:   Hemoglobin 9.5 (*)    HCT 29.3 (*)  All other components within normal limits  I-STAT CHEM 8, ED - Abnormal; Notable for the following components:   BUN 27 (*)    Creatinine, Ser 1.60 (*)    Calcium, Ion 1.08 (*)    Hemoglobin 8.8 (*)    HCT 26.0 (*)    All other components within normal limits  POC OCCULT BLOOD, ED - Abnormal; Notable for the following components:   Fecal Occult Bld POSITIVE (*)    All other components within normal limits  TROPONIN I (HIGH SENSITIVITY) - Abnormal; Notable for the following components:   Troponin I (High Sensitivity) 22 (*)    All other components within normal limits  MAGNESIUM  PROTIME-INR  APTT  TYPE AND SCREEN  PREPARE RBC (CROSSMATCH)  PREPARE RBC (CROSSMATCH)  TROPONIN I (HIGH SENSITIVITY)    EKG None  Radiology IR US Guide Vasc Access Left  Result Date: 09/20/2022 INDICATION: GI bleed.  Hypotension.  Resuscitation. EXAM: NON-TUNNELED CENTRAL VENOUS CATHETER PLACEMENT WITH ULTRASOUND AND FLUOROSCOPIC GUIDANCE COMPARISON:  CT AP, earlier same day. MEDICATIONS: 5 mL lidocaine 1% FLUOROSCOPY TIME:  Fluoroscopic dose; 0 mGy COMPLICATIONS: None immediate. PROCEDURE: Informed written consent was obtained from the patient and/or patient's representative after a discussion of the risks, benefits, and alternatives to treatment. Questions regarding the procedure were encouraged and answered. The LEFT groin was prepped with chlorhexidine in a sterile fashion, and a sterile drape was applied covering the operative field. Maximum barrier sterile  technique with sterile gowns and gloves were used for the procedure. A timeout was performed prior to the initiation of the procedure. After the overlying soft tissues were anesthetized, a small venotomy incision was created and a micropuncture kit was utilized to access the LEFT greater saphenous vein. Real-time ultrasound guidance was utilized for vascular access including the acquisition of a permanent ultrasound image documenting patency of the accessed vessel. The microwire was utilized to measure appropriate catheter length. A stiff glidewire was advanced to the level of the IVC. Under fluoroscopic guidance, the venotomy was serially dilated, ultimately allowing placement of a 16 cm triple-lumen non tunneled CVC with tip ultimately terminating within the LEFT pelvic veins. Final catheter positioning was confirmed and documented with a spot radiographic image. The catheter aspirates and flushes normally. The catheter was flushed heparinized saline. The catheter exit site was secured with a 2-0-Silk retention suture. A dressing was placed. The patient tolerated the procedure well without immediate post procedural complication. IMPRESSION: Successful placement of a LEFT femoral vein approach non-tunneled central venous catheter for resuscitation. The catheter is ready for immediate use. Roanna Banning, MD [facility] Radiology Electronically Signed   By: Roanna Banning M.D.   On: 09/20/2022 14:33   IR Angiogram Visceral Selective  Result Date: 09/20/2022 INDICATION: 89 year old woman with acute lower GI bleed presents to interventional radiology for angiogram and possible embolization. CT angiography performed on 09/20/2022 shows active extravasation in the distal ascending colon, likely in the right colic territory. EXAM: 1. Ultrasound-guided access of right common femoral artery 2. Superior mesenteric angiogram 3. Right colic angiogram and branch embolization  MEDICATIONS: None ANESTHESIA/SEDATION: None CONTRAST:  44 mL of Omnipaque 300 FLUOROSCOPY: Radiation Exposure Index (as provided by the fluoroscopic device): 170 mGy Kerma COMPLICATIONS: None immediate. PROCEDURE: Informed consent was obtained from the patient following explanation of the procedure, risks, benefits and alternatives. The patient understands, agrees and consents for the procedure. All questions were addressed. A time out was performed prior to the initiation of the  procedure. Maximal barrier sterile technique utilized including caps, mask, sterile gowns, sterile gloves, large sterile drape, hand hygiene, and chlorhexidine prep. Ultrasound image documenting patency of the right common femoral artery was obtained and placed in permanent medical record. Sterile ultrasound probe cover and gel utilized throughout the procedure. Utilizing continuous ultrasound guidance, the right common femoral artery was accessed at the level of the femoral head with a 21 gauge needle. 21 gauge needle exchanged for a transitional dilator set over 0.018 inch guidewire. Transitional dilator set exchanged for 5 French sheath over 0.035 inch guidewire. The superior mesenteric artery was selected with VS1 catheter. Superior mesenteric angiogram showed patent small and large bowel branches. Active extravasation was seen from the right colic branch as expected based on the CT. The right colic branch was selected with Progreat microcatheter. Access was advanced as distally as possible into the branch responsible for the hemorrhage and embolization was performed with one 3 mm x 5 cm detachable Ruby coil. At this point the base catheter access was lost, therefore microcatheter was removed. The superior mesenteric artery with again engaged with the VS 1 catheter. Lantern microcatheter was advanced back into the right colic artery. Repeat angiogram showed no additional hemorrhage. Lantern microcatheter was removed and superior  mesenteric angiogram was repeated which showed no additional site of hemorrhage. The patient became more hypotensive at this point. Rapid response was consulted. Patient was given bolus of fluid as well as 1 unit of packed red blood cells. Her blood pressure improved. Given her suboptimal IV access, a left groin central venous catheter was placed. The patient's condition continued to improve and she did not need further intervention. Right groin access was removed and hemostasis achieved with 20 minutes of manual compression. IMPRESSION: Successful embolization of right colic branch for treatment of acute lower GI bleed. Electronically Signed   By: Acquanetta Belling M.D.   On: 09/20/2022 14:17   IR US Guide Vasc Access Right  Result Date: 09/20/2022 INDICATION: 89 year old woman with acute lower GI bleed presents to interventional radiology for angiogram and possible embolization. CT angiography performed on 09/20/2022 shows active extravasation in the distal ascending colon, likely in the right colic territory. EXAM: 1. Ultrasound-guided access of right common femoral artery 2. Superior mesenteric angiogram 3. Right colic angiogram and branch embolization MEDICATIONS: None ANESTHESIA/SEDATION: None CONTRAST:  44 mL of Omnipaque 300 FLUOROSCOPY: Radiation Exposure Index (as provided by the fluoroscopic device): 170 mGy Kerma COMPLICATIONS: None immediate. PROCEDURE: Informed consent was obtained from the patient following explanation of the procedure, risks, benefits and alternatives. The patient understands, agrees and consents for the procedure. All questions were addressed. A time out was performed prior to the initiation of the procedure. Maximal barrier sterile technique utilized including caps, mask, sterile gowns, sterile gloves, large sterile drape, hand hygiene, and chlorhexidine prep. Ultrasound image documenting patency of the right common femoral artery was obtained and placed in permanent medical record.  Sterile ultrasound probe cover and gel utilized throughout the procedure. Utilizing continuous ultrasound guidance, the right common femoral artery was accessed at the level of the femoral head with a 21 gauge needle. 21 gauge needle exchanged for a transitional dilator set over 0.018 inch guidewire. Transitional dilator set exchanged for 5 French sheath over 0.035 inch guidewire. The superior mesenteric artery was selected with VS1 catheter. Superior mesenteric angiogram showed patent small and large bowel branches. Active extravasation was seen from the right colic branch as expected based on the CT. The right colic branch was  selected with Progreat microcatheter. Access was advanced as distally as possible into the branch responsible for the hemorrhage and embolization was performed with one 3 mm x 5 cm detachable Ruby coil. At this point the base catheter access was lost, therefore microcatheter was removed. The superior mesenteric artery with again engaged with the VS 1 catheter. Lantern microcatheter was advanced back into the right colic artery. Repeat angiogram showed no additional hemorrhage. Lantern microcatheter was removed and superior mesenteric angiogram was repeated which showed no additional site of hemorrhage. The patient became more hypotensive at this point. Rapid response was consulted. Patient was given bolus of fluid as well as 1 unit of packed red blood cells. Her blood pressure improved. Given her suboptimal IV access, a left groin central venous catheter was placed. The patient's condition continued to improve and she did not need further intervention. Right groin access was removed and hemostasis achieved with 20 minutes of manual compression. IMPRESSION: Successful embolization of right colic branch for treatment of acute lower GI bleed. Electronically Signed   By: Acquanetta Belling M.D.   On: 09/20/2022 14:17   IR EMBO ART  VEN HEMORR LYMPH EXTRAV  INC GUIDE ROADMAPPING  Result Date:  09/20/2022 INDICATION: 89 year old woman with acute lower GI bleed presents to interventional radiology for angiogram and possible embolization. CT angiography performed on 09/20/2022 shows active extravasation in the distal ascending colon, likely in the right colic territory. EXAM: 1. Ultrasound-guided access of right common femoral artery 2. Superior mesenteric angiogram 3. Right colic angiogram and branch embolization MEDICATIONS: None ANESTHESIA/SEDATION: None CONTRAST:  44 mL of Omnipaque 300 FLUOROSCOPY: Radiation Exposure Index (as provided by the fluoroscopic device): 170 mGy Kerma COMPLICATIONS: None immediate. PROCEDURE: Informed consent was obtained from the patient following explanation of the procedure, risks, benefits and alternatives. The patient understands, agrees and consents for the procedure. All questions were addressed. A time out was performed prior to the initiation of the procedure. Maximal barrier sterile technique utilized including caps, mask, sterile gowns, sterile gloves, large sterile drape, hand hygiene, and chlorhexidine prep. Ultrasound image documenting patency of the right common femoral artery was obtained and placed in permanent medical record. Sterile ultrasound probe cover and gel utilized throughout the procedure. Utilizing continuous ultrasound guidance, the right common femoral artery was accessed at the level of the femoral head with a 21 gauge needle. 21 gauge needle exchanged for a transitional dilator set over 0.018 inch guidewire. Transitional dilator set exchanged for 5 French sheath over 0.035 inch guidewire. The superior mesenteric artery was selected with VS1 catheter. Superior mesenteric angiogram showed patent small and large bowel branches. Active extravasation was seen from the right colic branch as expected based on the CT. The right colic branch was selected with Progreat microcatheter. Access was advanced as distally as possible into the branch responsible  for the hemorrhage and embolization was performed with one 3 mm x 5 cm detachable Ruby coil. At this point the base catheter access was lost, therefore microcatheter was removed. The superior mesenteric artery with again engaged with the VS 1 catheter. Lantern microcatheter was advanced back into the right colic artery. Repeat angiogram showed no additional hemorrhage. Lantern microcatheter was removed and superior mesenteric angiogram was repeated which showed no additional site of hemorrhage. The patient became more hypotensive at this point. Rapid response was consulted. Patient was given bolus of fluid as well as 1 unit of packed red blood cells. Her blood pressure improved. Given her suboptimal IV access, a left groin  central venous catheter was placed. The patient's condition continued to improve and she did not need further intervention. Right groin access was removed and hemostasis achieved with 20 minutes of manual compression. IMPRESSION: Successful embolization of right colic branch for treatment of acute lower GI bleed. Electronically Signed   By: Acquanetta Belling M.D.   On: 09/20/2022 14:17   IR Fluoro Guide CV Line Left  Result Date: 09/20/2022 INDICATION: GI bleed.  Hypotension.  Resuscitation. EXAM: NON-TUNNELED CENTRAL VENOUS CATHETER PLACEMENT WITH ULTRASOUND AND FLUOROSCOPIC GUIDANCE COMPARISON:  CT AP, earlier same day. MEDICATIONS: 5 mL lidocaine 1% FLUOROSCOPY TIME:  Fluoroscopic dose; 0 mGy COMPLICATIONS: None immediate. PROCEDURE: Informed written consent was obtained from the patient and/or patient's representative after a discussion of the risks, benefits, and alternatives to treatment. Questions regarding the procedure were encouraged and answered. The LEFT groin was prepped with chlorhexidine in a sterile fashion, and a sterile drape was applied covering the operative field. Maximum barrier sterile technique with sterile gowns and gloves were used for the procedure. A timeout was performed  prior to the initiation of the procedure. After the overlying soft tissues were anesthetized, a small venotomy incision was created and a micropuncture kit was utilized to access the LEFT greater saphenous vein. Real-time ultrasound guidance was utilized for vascular access including the acquisition of a permanent ultrasound image documenting patency of the accessed vessel. The microwire was utilized to measure appropriate catheter length. A stiff glidewire was advanced to the level of the IVC. Under fluoroscopic guidance, the venotomy was serially dilated, ultimately allowing placement of a 16 cm triple-lumen non tunneled CVC with tip ultimately terminating within the LEFT pelvic veins. Final catheter positioning was confirmed and documented with a spot radiographic image. The catheter aspirates and flushes normally. The catheter was flushed heparinized saline. The catheter exit site was secured with a 2-0-Silk retention suture. A dressing was placed. The patient tolerated the procedure well without immediate post procedural complication. IMPRESSION: Successful placement of a LEFT femoral vein approach non-tunneled central venous catheter for resuscitation. The catheter is ready for immediate use. Roanna Banning, MD [facility] Radiology Electronically Signed   By: Roanna Banning M.D.   On: 09/20/2022 13:13   CT ANGIO GI BLEED  Result Date: 09/20/2022 CLINICAL DATA:  Lower GI bleed. EXAM: CTA ABDOMEN AND PELVIS WITHOUT AND WITH CONTRAST TECHNIQUE: Multidetector CT imaging of the abdomen and pelvis was performed using the standard protocol during bolus administration of intravenous contrast. Multiplanar reconstructed images and MIPs were obtained and reviewed to evaluate the vascular anatomy. RADIATION DOSE REDUCTION: This exam was performed according to the departmental dose-optimization program which includes automated exposure control, adjustment of the mA and/or kV  according to patient size and/or use of iterative reconstruction technique. CONTRAST:  70mL OMNIPAQUE IOHEXOL 350 MG/ML SOLN COMPARISON:  CT abdomen/pelvis 01/22/2022. FINDINGS: VASCULAR Aorta: Normal caliber aorta without aneurysm, dissection, vasculitis or significant stenosis. Extensive atherosclerotic calcifications. Celiac: Patent without evidence of aneurysm, dissection, vasculitis or significant stenosis. SMA: Patent without evidence of aneurysm, dissection, vasculitis or significant stenosis. Renals: Calcified plaque results in moderate stenosis of the left renal artery and mild stenosis of the right renal artery. IMA: Patent without evidence of aneurysm, dissection, vasculitis or significant stenosis. Inflow: Patent without evidence of aneurysm, dissection, vasculitis or significant stenosis. Proximal Outflow: Bilateral common femoral and visualized portions of the superficial and profunda femoral arteries are patent without evidence of aneurysm, dissection, vasculitis or significant stenosis. Veins: Normal. Review of  the MIP images confirms the above findings. NON-VASCULAR Lower chest: No acute abnormality. Extensive coronary artery calcifications. Hepatobiliary: No focal liver abnormality is seen. Status post cholecystectomy. No biliary dilatation. Pancreas: Unremarkable. No pancreatic ductal dilatation or surrounding inflammatory changes. Spleen: Normal in size without focal abnormality. Adrenals/Urinary Tract: Adrenal glands are unremarkable. Kidneys are unremarkable without radiopaque stone or hydronephrosis. Bladder is decompressed. Stomach/Bowel: Unchanged moderate hiatal hernia. No dilated loops of small bowel. Appendix is not visualized. Severe pancolonic diverticulosis. Active arterial extravasation of contrast within the hepatic flexure (axial image 65 series 10). No bowel wall thickening or surrounding inflammation to suggest acute diverticulitis. Lymphatic: No abdominal or pelvic  lymphadenopathy. Reproductive: Status post hysterectomy. No adnexal masses. Other: No abdominal wall hernia or abnormality. No abdominopelvic ascites. Musculoskeletal: Left total hip arthroplasty. IMPRESSION: 1. Active arterial extravasation of contrast within the hepatic flexure of the colon. 2. Severe pancolonic diverticulosis without evidence of acute diverticulitis. 3. Unchanged moderate hiatal hernia. 4. Coronary artery disease. Aortic Atherosclerosis (ICD10-I70.0). Electronically Signed   By: Orvan Falconer M.D.   On: 09/20/2022 09:37      Medications Ordered in ED Medications  lidocaine-EPINEPHrine 1.5 %-1:200000 injection 30 mL (has no administration in time range)  0.9 %  sodium chloride infusion (Manually program via Guardrails IV Fluids) (has no administration in time range)  0.9 %  sodium chloride infusion (Manually program via Guardrails IV Fluids) (has no administration in time range)  dicyclomine (BENTYL) 10 MG/5ML solution 10 mg (has no administration in time range)  ferrous sulfate tablet 325 mg (has no administration in time range)  albuterol (PROVENTIL) (2.5 MG/3ML) 0.083% nebulizer solution 3 mL (has no administration in time range)  acetaminophen (TYLENOL) tablet 650 mg (has no administration in time range)    Or  acetaminophen (TYLENOL) suppository 650 mg (has no administration in time range)  iohexol (OMNIPAQUE) 350 MG/ML injection 70 mL (70 mLs Intravenous Contrast Given 09/20/22 0918)  iohexol (OMNIPAQUE) 300 MG/ML solution 150 mL (44 mLs Intravenous Contrast Given 09/20/22 1226)  0.9 %  sodium chloride infusion (Manually program via Guardrails IV Fluids) ( Intravenous New Bag/Given 09/20/22 1308)  lidocaine (XYLOCAINE) 1 % (with pres) injection 20 mL (10 mLs Intradermal Given 09/20/22 1227)  iohexol (OMNIPAQUE) 300 MG/ML solution 100 mL (1 mL Intra-arterial Contrast Given 09/20/22 1248)  alum & mag hydroxide-simeth (MAALOX/MYLANTA) 200-200-20 MG/5ML suspension 30 mL (30 mLs Oral  Given 09/20/22 1311)    ED Course/ Medical Decision Making/ A&P Clinical Course as of 09/20/22 1654  Thu Sep 20, 2022  0851 Hgb decreased to 9.0 from 10.1 [CD]  0931 Active extrav on arterial phase w/ increased pooling on venous phase of the right colon at the hepatic flexure. Diverticular bleed.  [CD]    Clinical Course User Index [CD] Rhys Martini, DO                                Medical Decision Making Problems Addressed: Gastrointestinal hemorrhage, unspecified gastrointestinal hemorrhage type: complicated acute illness or injury  Amount and/or Complexity of Data Reviewed External Data Reviewed: labs, radiology and notes. Labs: ordered. Radiology: ordered and independent interpretation performed. ECG/medicine tests: ordered and independent interpretation performed.  Risk OTC drugs. Prescription drug management. Decision regarding hospitalization.    Patient is a 89 y.o. female with a hx of mild aortic stenosis, chronic kidney disease stage III, hypertension and history of GI bleed, presenting today for blood in her stool.  On exam, patient is alert and oriented.  She is in regular rhythm, bradycardic to the 40s.  Lungs are clear to auscultation.  Abdominal exam is benign.  She does have dark red blood present on rectal exam.  Presentation concerning for lower GI bleed.  Specifically, she has had diverticulosis and diverticulitis in the past.  With concerns of left lower quadrant pain, this could be flare of her diverticulitis.  Lab work revealed globin of 9.0 (prior 10.1).  Creatinine of 1.49 is at baseline.  CT angio of the chest abdomen pelvis reveals: 1. Active arterial extravasation of contrast within the hepatic flexure of the colon. 2. Severe pancolonic diverticulosis without evidence of acute diverticulitis. 3. Unchanged moderate hiatal hernia. 4. Coronary artery disease.  Discussed case with interventional radiology, who state patient would likely be a  good candidate for embolization.  Request that GI be involved as well.  Discussed case with GI who is also in agreement with IR embolization.  Patient was taken to the IR suite hemodynamically stable.  In the meantime, discussed case with family medicine who will plan to admit the patient for GI bleed secondary to diverticulitis following embolization procedure.  Of note, patient did have multiple bloody bowel movements while in IR and had a decrease in blood pressure.  She was transfused while in IR with improvement.  Upon return to the ED prior to admission, she was hemodynamically stable and mentating appropriately.  She did complain acid reflux symptoms, therefore EKG was obtained without any acute ischemic findings.  Patient is admitted to family medicine hemodynamically stable and appropriate for transfer to the floor.  { Final Clinical Impression(s) / ED Diagnoses Final diagnoses:  Gastrointestinal hemorrhage, unspecified gastrointestinal hemorrhage type    Rx / DC Orders ED Discharge Orders     None         Rhys Martini, DO 09/20/22 1654    Glendora Score, MD 09/20/22 [phone]

## 2022-09-20 NOTE — Progress Notes (Signed)
FMTS Brief Progress Note  S: Patient seen on night rounds with Dr. Elliot Gurney.  Resting in bed comfortably.  Had eaten a popsicle recently.  Denies recent bowel movements.  Denies abdominal pain chest pain and shortness of breath.   O: BP (!) 137/53 (BP Location: Left Arm)   Pulse (!) 58   Temp 97.9 F (36.6 C) (Oral)   Resp 14   SpO2 100%    Physical Exam Cardiovascular:     Rate and Rhythm: Normal rate and regular rhythm.  Pulmonary:     Effort: Pulmonary effort is normal.     Breath sounds: Normal breath sounds.  Neurological:     Mental Status: She is alert.  Psychiatric:        Attention and Perception: Attention normal.        Mood and Affect: Mood normal.        Speech: Speech normal.    A/P:  Diverticular bleed Had mesenteric embolization with IR earlier today. Remains hemodynamically stable. Post procedure Hgb 9.5. Troponin downtrending 32>29>27. -nursing will notify if pt has blood in stool -CTM vitals -AM CBC -transfusion threshold <8  Chronic HFpEF Hypotension and bradycardia On admission -continue to hold home antihypertensives -will monitor vitals closely -AM BMP  Lorayne Bender, MD 09/20/2022, 11:07 PM PGY-1, [facility] Night Resident  Please page [phone] with questions.

## 2022-09-20 NOTE — Procedures (Addendum)
Interventional Radiology Procedure Note  Procedure: 1. Superior Mesenteric Angiogram and coil embolization of right colic branch  2. Left femoral central venous catheter placement  Indication: Acute lower GI bleed  Findings: Please refer to procedural dictation for full description.  Complications: None  EBL: < 10 mL  Brenda Belling, MD [phone]

## 2022-09-20 NOTE — Consult Note (Addendum)
Chief Complaint: Patient was seen in consultation today for Mesenteric arteriogram - possible embolization Chief Complaint  Patient presents with   Rectal Bleeding   Abdominal Pain   at the request of Dr Audrie Lia Dr Katherina Right  Supervising Physician: Mir, Mauri Reading  Patient Status: [facility] - ED  History of Present Illness: [name] is a 89 y.o. female   DNR Code status per pt and chart DNR status to be observed even during IR procedure per pt Ao stenosis; CKD; HTN; Hx GI bleed Comes to ED today with GI Bleed--- rectal bleeding since last night She has had this same type bleeding few years ago--- "went to ICU" Bleeding is bright red; has had a trip to the toilet every hour or so at home; 3-4 x since been in ED.  CT this am:  IMPRESSION: 1. Active arterial extravasation of contrast within the hepatic flexure of the colon. 2. Severe pancolonic diverticulosis without evidence of acute diverticulitis. 3. Unchanged moderate hiatal hernia. 4. Coronary artery disease.  Request per ED MD for Mesenteric arteriogram with possible embolization Approved per Dr Bryn Gulling  Past Medical History:  Diagnosis Date   Acid reflux    takes Nexium daily   Anemia    takes Ferrous Sulfate daily   Aortic stenosis    mild AS pk grad 18, mean grad 9, AVA 1.32 cm 03/2015 echo (Dr. Jacinto Halim)   Arthritis    Chronic kidney disease (CKD), stage III (moderate) (HCC)    Diverticulitis    Hematochezia 03/2015   High cholesterol    can't take the meds   History of blood transfusion 03/2015   no abnormal reaction noted   History of GI diverticular bleed    HTN (hypertension)    takes Losartan-HCTZ and Metoprolol daily   Joint pain    Joint swelling    Nocturia    Numbness    in legs   Peripheral edema    occasionally   Shortness of breath dyspnea    occasionally and with exertion   Slow urinary stream    at times    Past Surgical History:  Procedure Laterality Date   ABCESS DRAINAGE      abdominal abcess   cataract surgery Bilateral    CHOLECYSTECTOMY     DILATION AND CURETTAGE OF UTERUS     ESOPHAGOGASTRODUODENOSCOPY N/A 02/23/2015   Procedure: ESOPHAGOGASTRODUODENOSCOPY (EGD);  Surgeon: Carman Ching, MD;  Location: [facility] ENDOSCOPY;  Service: Endoscopy;  Laterality: N/A;   FLEXIBLE SIGMOIDOSCOPY N/A 02/24/2015   Procedure: FLEXIBLE SIGMOIDOSCOPY;  Surgeon: Carman Ching, MD;  Location: [facility] ENDOSCOPY;  Service: Endoscopy;  Laterality: N/A;   IR ANGIOGRAM SELECTIVE EACH ADDITIONAL VESSEL  03/06/2017   IR ANGIOGRAM SELECTIVE EACH ADDITIONAL VESSEL  03/06/2017   IR ANGIOGRAM SELECTIVE EACH ADDITIONAL VESSEL  03/06/2017   IR ANGIOGRAM SELECTIVE EACH ADDITIONAL VESSEL  03/08/2017   IR ANGIOGRAM VISCERAL SELECTIVE  03/06/2017   IR ANGIOGRAM VISCERAL SELECTIVE  03/06/2017   IR ANGIOGRAM VISCERAL SELECTIVE  03/06/2017   IR ANGIOGRAM VISCERAL SELECTIVE  03/08/2017   IR ANGIOGRAM VISCERAL SELECTIVE  03/08/2017   IR EMBO ART  VEN HEMORR LYMPH EXTRAV  INC GUIDE ROADMAPPING  03/06/2017   IR EMBO ART  VEN HEMORR LYMPH EXTRAV  INC GUIDE ROADMAPPING  03/08/2017   IR US GUIDE VASC ACCESS RIGHT  03/06/2017   IR US GUIDE VASC ACCESS RIGHT  03/08/2017   left knee surgery     TOTAL HIP ARTHROPLASTY Left 05/17/2015  Procedure: LEFT TOTAL HIP ARTHROPLASTY ANTERIOR APPROACH;  Surgeon: Kathryne Hitch, MD;  Location: [facility] OR;  Service: Orthopedics;  Laterality: Left;    Allergies: Ezetimibe, Lipitor [atorvastatin], Pravachol [pravastatin], Statins, Zocor [simvastatin], and Cholestyramine  Medications: Prior to Admission medications   Medication Sig Start Date End Date Taking? Authorizing Provider  acetaminophen (TYLENOL) 650 MG CR tablet Take 650 mg by mouth daily.    [provider]  albuterol (VENTOLIN HFA) 108 (90 Base) MCG/ACT inhaler Inhale 1-2 puffs into the lungs every 6 (six) hours as needed for wheezing or shortness of breath. 09/14/22   Erick Alley, DO  Alum Hydroxide-Mag Carbonate  (GAVISCON PO) Take by mouth.    [provider]  Ascorbic Acid (VITAMIN C) 1000 MG tablet Take 1,000 mg by mouth daily.    [provider]  Calcium Carbonate Antacid (GAVISCON ACID BRKTHRGH FORMULA PO) Take 1 tablet by mouth as needed.    [provider]  Cholecalciferol (VITAMIN D3) 2000 units TABS Take 2,000 Units by mouth daily.    [provider]  diclofenac sodium (VOLTAREN) 1 % GEL Apply 2 g topically 2 (two) times daily as needed (pain).  Patient not taking: Reported on 09/18/2022    [provider]  ferrous sulfate 325 (65 FE) MG EC tablet Take 1 tablet (325 mg total) by mouth daily with breakfast. 07/20/22   Erick Alley, DO  furosemide (LASIX) 20 MG tablet Take 1 tablet (20 mg total) by mouth daily as needed for edema. 09/13/22   Nahser, Deloris Ping, MD  isosorbide mononitrate (IMDUR) 120 MG 24 hr tablet Take 1 tablet (120 mg total) by mouth daily. 09/13/22   Nahser, Deloris Ping, MD  lidocaine (LIDODERM) 5 % Place 1 patch onto the skin daily. Remove & Discard patch within 12 hours or as directed by MD Patient not taking: Reported on 10/10/2021 09/22/20   Erick Alley, DO  metoprolol succinate (TOPROL XL) 25 MG 24 hr tablet Take 1 tablet (25 mg total) by mouth daily. 09/18/22   Nahser, Deloris Ping, MD  Multiple Vitamin (MULITIVITAMIN WITH MINERALS) TABS Take 1 tablet by mouth daily.    [provider]  nitroGLYCERIN (NITROSTAT) 0.4 MG SL tablet Place 1 tablet (0.4 mg total) under the tongue every 5 (five) minutes as needed for chest pain. 09/14/22   Erick Alley, DO  polyvinyl alcohol (LIQUIFILM TEARS) 1.4 % ophthalmic solution Place 1 drop into both eyes as needed for dry eyes.    [provider]     Family History  Problem Relation Age of Onset   Diabetes Other    Hypertension Other    Cancer Other    Heart disease Other     Social History   Socioeconomic History   Marital status: Widowed    Spouse name: Not on file   Number of  children: Not on file   Years of education: Not on file   Highest education level: Not on file  Occupational History   Not on file  Tobacco Use   Smoking status: Former    Passive exposure: Past   Smokeless tobacco: Never   Tobacco comments:    quit smoking 8+ yrs ago  Vaping Use   Vaping status: Never Used  Substance and Sexual Activity   Alcohol use: No    Comment: quit yrs ago   Drug use: No   Sexual activity: Not Currently  Other Topics Concern   Not on file  Social History Narrative  Not on file   Social Determinants of Health   Financial Resource Strain: Low Risk  (09/30/2017)   Overall Financial Resource Strain (CARDIA)    Difficulty of Paying Living Expenses: Not very hard  Food Insecurity: Unknown (09/30/2017)   Hunger Vital Sign    Worried About Running Out of Food in the Last Year: Patient declined    Ran Out of Food in the Last Year: Patient declined  Transportation Needs: Unknown (09/30/2017)   PRAPARE - Administrator, Civil Service (Medical): Patient declined    Lack of Transportation (Non-Medical): Patient declined  Physical Activity: Insufficiently Active (09/30/2017)   Exercise Vital Sign    Days of Exercise per Week: 1 day    Minutes of Exercise per Session: 10 min  Stress: No Stress Concern Present (09/30/2017)   [employer] of Occupational Health - Occupational Stress Questionnaire    Feeling of Stress : Only a little  Social Connections: Somewhat Isolated (09/30/2017)   Social Connection and Isolation Panel [NHANES]    Frequency of Communication with Friends and Family: More than three times a week    Frequency of Social Gatherings with Friends and Family: Three times a week    Attends Religious Services: 1 to 4 times per year    Active Member of Clubs or Organizations: No    Attends Banker Meetings: Never    Marital Status: Widowed    Review of Systems: A 12 point ROS discussed and pertinent positives are indicated  in the HPI above.  All other systems are negative.  Review of Systems  Constitutional:  Positive for activity change. Negative for fatigue and fever.  Respiratory:  Negative for cough and shortness of breath.   Cardiovascular:  Negative for chest pain.  Gastrointestinal:  Positive for anal bleeding and blood in stool. Negative for rectal pain.  Musculoskeletal:  Negative for back pain.  Neurological:  Negative for weakness.  Psychiatric/Behavioral:  Negative for behavioral problems and confusion.     Vital Signs: BP (!) 119/49   Pulse (!) 47   Temp 98.6 F (37 C) (Oral)   Resp 18   SpO2 97%   Advance Care Plan: The advanced care plan/surrogate decision maker was discussed at the time of visit and documented in the medical record.    Physical Exam Vitals reviewed.  Cardiovascular:     Rate and Rhythm: Normal rate and regular rhythm.     Heart sounds: Murmur heard.  Pulmonary:     Effort: Pulmonary effort is normal.     Breath sounds: Normal breath sounds. No wheezing.  Abdominal:     Palpations: Abdomen is soft.     Tenderness: There is no abdominal tenderness.  Skin:    General: Skin is warm.  Neurological:     Mental Status: She is alert and oriented to person, place, and time.  Psychiatric:        Behavior: Behavior normal.     Imaging: CT ANGIO GI BLEED  Result Date: 09/20/2022 CLINICAL DATA:  Lower GI bleed. EXAM: CTA ABDOMEN AND PELVIS WITHOUT AND WITH CONTRAST TECHNIQUE: Multidetector CT imaging of the abdomen and pelvis was performed using the standard protocol during bolus administration of intravenous contrast. Multiplanar reconstructed images and MIPs were obtained and reviewed to evaluate the vascular anatomy. RADIATION DOSE REDUCTION: This exam was performed according to the departmental dose-optimization program which includes automated exposure control, adjustment of the mA and/or kV according to patient size and/or use of iterative  reconstruction technique.  CONTRAST:  70mL OMNIPAQUE IOHEXOL 350 MG/ML SOLN COMPARISON:  CT abdomen/pelvis 01/22/2022. FINDINGS: VASCULAR Aorta: Normal caliber aorta without aneurysm, dissection, vasculitis or significant stenosis. Extensive atherosclerotic calcifications. Celiac: Patent without evidence of aneurysm, dissection, vasculitis or significant stenosis. SMA: Patent without evidence of aneurysm, dissection, vasculitis or significant stenosis. Renals: Calcified plaque results in moderate stenosis of the left renal artery and mild stenosis of the right renal artery. IMA: Patent without evidence of aneurysm, dissection, vasculitis or significant stenosis. Inflow: Patent without evidence of aneurysm, dissection, vasculitis or significant stenosis. Proximal Outflow: Bilateral common femoral and visualized portions of the superficial and profunda femoral arteries are patent without evidence of aneurysm, dissection, vasculitis or significant stenosis. Veins: Normal. Review of the MIP images confirms the above findings. NON-VASCULAR Lower chest: No acute abnormality. Extensive coronary artery calcifications. Hepatobiliary: No focal liver abnormality is seen. Status post cholecystectomy. No biliary dilatation. Pancreas: Unremarkable. No pancreatic ductal dilatation or surrounding inflammatory changes. Spleen: Normal in size without focal abnormality. Adrenals/Urinary Tract: Adrenal glands are unremarkable. Kidneys are unremarkable without radiopaque stone or hydronephrosis. Bladder is decompressed. Stomach/Bowel: Unchanged moderate hiatal hernia. No dilated loops of small bowel. Appendix is not visualized. Severe pancolonic diverticulosis. Active arterial extravasation of contrast within the hepatic flexure (axial image 65 series 10). No bowel wall thickening or surrounding inflammation to suggest acute diverticulitis. Lymphatic: No abdominal or pelvic lymphadenopathy. Reproductive: Status post hysterectomy. No adnexal masses. Other: No  abdominal wall hernia or abnormality. No abdominopelvic ascites. Musculoskeletal: Left total hip arthroplasty. IMPRESSION: 1. Active arterial extravasation of contrast within the hepatic flexure of the colon. 2. Severe pancolonic diverticulosis without evidence of acute diverticulitis. 3. Unchanged moderate hiatal hernia. 4. Coronary artery disease. Aortic Atherosclerosis (ICD10-I70.0). Electronically Signed   By: Orvan Falconer M.D.   On: 09/20/2022 09:37    Labs:  CBC: Recent Labs    12/05/21 0507 04/10/22 1710 07/20/22 1131 09/20/22 0822 09/20/22 0832  WBC 3.8* 3.3* 3.3* 3.8*  --   HGB 11.3* 10.7* 10.1* 9.0* 8.8*  HCT 33.6* 33.0* 31.5* 28.4* 26.0*  PLT 246 225 348 200  --     COAGS: Recent Labs    12/05/21 0507 09/20/22 0822  INR 1.0 1.2  APTT  --  31    BMP: Recent Labs    12/05/21 0507 07/20/22 1131 09/20/22 0822 09/20/22 0832  NA 141 141 141 140  K 4.1 4.5 4.4 4.5  CL 104 104 108 108  CO2 25 24 22   --   GLUCOSE 118* 93 103* 99  BUN 20 25 23  27*  CALCIUM 9.9 9.7 8.9  --   CREATININE 1.54* 1.47* 1.49* 1.60*  GFRNONAA 31*  --  32*  --     LIVER FUNCTION TESTS: Recent Labs    12/05/21 0507 09/20/22 0822  BILITOT 0.6 0.4  AST 20 18  ALT 11 13  ALKPHOS 50 40  PROT 6.8 5.5*  ALBUMIN 3.7 3.0*    TUMOR MARKERS: No results for input(s): "AFPTM", "CEA", "CA199", "CHROMGRNA" in the last 8760 hours.  Assessment and Plan:  GI Bleed Scheduled for mesenteric arteriogram with possible embolization Cr 1.6 Risks and benefits of mesenteric arteriogram with possible embolization were discussed with the patient including, but not limited to bleeding, infection, vascular injury or contrast induced renal failure.  This interventional procedure involves the use of X-rays and because of the nature of the planned procedure, it is possible that we will have prolonged use of X-ray fluoroscopy.  Potential radiation  risks to you include (but are not limited to) the  following: - A slightly elevated risk for cancer  several years later in life. This risk is typically less than 0.5% percent. This risk is low in comparison to the normal incidence of human cancer, which is 33% for women and 50% for men according to the [employer] Society. - Radiation induced injury can include skin redness, resembling a rash, tissue breakdown / ulcers and hair loss (which can be temporary or permanent).   The likelihood of either of these occurring depends on the difficulty of the procedure and whether you are sensitive to radiation due to previous procedures, disease, or genetic conditions.   IF your procedure requires a prolonged use of radiation, you will be notified and given written instructions for further action.  It is your responsibility to monitor the irradiated area for the 2 weeks following the procedure and to notify your physician if you are concerned that you have suffered a radiation induced injury.    All of the patient's questions were answered, patient is agreeable to proceed.  Consent signed and in chart.  Thank you for this interesting consult.  I greatly enjoyed meeting ESABEL TESMER and look forward to participating in their care.  A copy of this report was sent to the requesting provider on this date.  Electronically Signed: Robet Leu, PA-C 09/20/2022, 10:14 AM   I spent a total of 20 Minutes    in face to face in clinical consultation, greater than 50% of which was counseling/coordinating care for Mesenteric arteriogram with possible embolization

## 2022-09-20 NOTE — H&P (Addendum)
Hospital Admission History and Physical Service Pager: [phone]  Patient name: [name] Medical record number: [id number] Date of Birth: September 12, 1923 Age: 89 y.o. Gender: female  Primary Care Provider: Erick Alley, DO Consultants: IR, GI  Code Status: DNR as confirmed by patient  Preferred Emergency Contact:  Contact Information     Name Relation Home Work Mobile   Ransom,Carolyn Daughter [phone]        Other Contacts   None on File      Chief Complaint: Rectal bleed   Assessment and Plan: [name] is a 89 y.o. female w/ history of diverticular bleed and mesenteric embolization presenting with rectal bleeding secondary to diverticulosis. Now s/p SMA embolization of the right colic branch with IR. Now stable after receiving 2 u pRBC.    -      Hospital     Hypertension     Diverticular bleed     S/p mesenteric embolization by IR. Hemodynamically stable at this time.   - Admit to FMTS, progressive, Attending Dr. Jennette Kettle,  - F/u post transfusion H&H  - Continue po iron  - vitals per unit routine  - Hold metoprolol and antihypertensives as patient was bradycardic with  hypotension earlier, add back as appropriate  - GI consulted appreciate recommendations  - Avoid NSAIDs  - AM CBC  - transfusion threshold < 8         Chronic heart failure (HCC)     HFpEF, last ECHO 3 years ago EF 60-65%, G1DD, moderate aortic stenosis  Patient hypotensive and bradycardia secondary to GI bleed  - Hold home imdur, metoprolol, and lasix.  - Monitor vitals  - AM BMP       CKD3 - Cr elevated but stable, no medications    FEN/GI: Regular diet  VTE Prophylaxis: hold in the setting of active bleed  Disposition: Most likely home   History of Present Illness:  [name] is a 89 y.o. female presenting with rectal bleeding.   Patient reports she started having multiple bouts of rectal bleeding starting yesterday afternoon.  Mention she saw her cardiologist 2  days ago and he started her on a new medication.  She says that she started this medication yesterday and 1 hour later started having copious bloody stool.  She says that she was worried as this has happened before and she was starting to feel little bit dizzy so she came to the ED.  She did not have any hematemesis.    In the ED, patient was initially hemodynamically stable.  Her hemoglobin was 9.  CT angio GI bleed was significant for active arterial extravasation of contrast within the hepatic flexure of the colon as well as pan colonic diverticulosis without evidence of acute diverticulitis as well as coronary artery disease.  IR was consulted as patient has had a mesenteric embolization previously 5 years ago.  GI was also consulted.  Patient received 1 unit of blood and was taken for repeat embolization.  During embolization procedure patient started to have copious bloody stools.  Her blood pressure and heart rate started to drop that she was given 1 more unit of blood.  Patient then stabilized more and was hemodynamically stable by the time family medicine was consulted.  Review Of Systems: Per HPI with the following additions: Rectal bleeding  Pertinent Past Medical History: Diverticular bleed with mesenteric embolization  HFpEF Hypertension Anemia Aortic stenosis CAD CKD stage III Osteoarthritis of the hip status post  total replacement of the left hip Remainder reviewed in history tab.   Pertinent Past Surgical History: Mesenteric embolization Total replacement of the left hip Remainder reviewed in history tab.  Pertinent Social History: Tobacco use: No Alcohol use: No Other Substance use: No Lives with no one else, by herself All of her children have died except for 1 which is the daughter that is listed in her emergency contacts.  She does not have much family or friends that are still alive.  Pertinent Family History: No history of GI bleeds Remainder reviewed in history  tab.   Important Outpatient Medications: Vitamin D3 Lasix 20 mg tablet Imdur 120 milligrams Metoprolol succinate 25 mg Nitroglycerin Famotidine 20 mg twice daily Ferrous sulfate 325 mg Albuterol as needed Remainder reviewed in medication history.   Objective: BP 134/79   Pulse (!) 52   Temp (!) 96 F (35.6 C) (Axillary)   Resp 16   SpO2 100%  Exam: General: Elderly woman laying in bed, in no acute distress Eyes: Slightly yellowish drainage, PERRLA, EOMI ENTM: Mucous membranes moist Neck: Supple Cardiovascular: RRR, radial pulses palpable, cap refill 3 seconds Respiratory: Normal work of breathing, clear to auscultation bilaterally Gastrointestinal: Soft, nondistended, nontender to palpation MSK: Full range of motion Neuro: Alert and oriented x 4, conversant Psych: Very pleasant, appropriate responses to questions  Labs:  CBC BMET  Recent Labs  Lab 09/20/22 1305  WBC 6.1  HGB 8.8*  HCT 27.8*  PLT 172   Recent Labs  Lab 09/20/22 0822 09/20/22 0832  NA 141 140  K 4.4 4.5  CL 108 108  CO2 22  --   BUN 23 27*  CREATININE 1.49* 1.60*  GLUCOSE 103* 99  CALCIUM 8.9  --      EKG: My own interpretation (not copied from electronic read) rate of 59, possible junctional rhythm, no ST elevations, QTc 474   Imaging Studies Performed:  CT angio GI bleed 1. Active arterial extravasation of contrast within the hepatic flexure of the colon. 2. Severe pancolonic diverticulosis without evidence of acute diverticulitis. 3. Unchanged moderate hiatal hernia. 4. Coronary artery disease.   Lockie Mola, MD 09/20/2022, 2:32 PM PGY-2, [facility]  FPTS Intern pager: [phone], text pages welcome Secure chat group [facility] Teaching Service

## 2022-09-20 NOTE — Plan of Care (Signed)
  Problem: Activity: Goal: Ability to return to baseline activity level will improve Outcome: Progressing   Problem: Cardiovascular: Goal: Ability to achieve and maintain adequate cardiovascular perfusion will improve Outcome: Progressing Goal: Vascular access site(s) Level 0-1 will be maintained Outcome: Progressing   Problem: Health Behavior/Discharge Planning: Goal: Ability to safely manage health-related needs after discharge will improve Outcome: Progressing   

## 2022-09-20 NOTE — Assessment & Plan Note (Signed)
HFpEF, last ECHO 3 years ago EF 60-65%, G1DD, moderate aortic stenosis  Patient hypotensive and bradycardia secondary to GI bleed  - Hold home imdur, metoprolol, and lasix.  - Monitor vitals  - AM BMP

## 2022-09-20 NOTE — ED Notes (Signed)
Taken to IR by transporter.  

## 2022-09-20 NOTE — Telephone Encounter (Signed)
Pt called with rectal bleeding dark blood.  I asked her to come to ER.  She thought perhaps the metoprolol was causing the bleeding, but I assured her that was not source.  She is on iron but she told me she has had before.  She was going to call a cab.    Triage please check with her to make sure she did go to ER. Thanks.

## 2022-09-20 NOTE — Assessment & Plan Note (Signed)
S/p mesenteric embolization by IR. Hemodynamically stable at this time.  - Admit to FMTS, progressive, Attending Dr. Jennette Kettle,  - F/u post transfusion H&H  - Continue po iron  - vitals per unit routine  - Hold metoprolol and antihypertensives as patient was bradycardic with hypotension earlier, add back as appropriate  - GI consulted appreciate recommendations  - Avoid NSAIDs  - AM CBC  - transfusion threshold < 8

## 2022-09-20 NOTE — Assessment & Plan Note (Deleted)
Patient hypotensive secondary to GI bleed  - Hold home imdur, metoprolol, and lasix.

## 2022-09-20 NOTE — ED Triage Notes (Signed)
Pt. Stated, [name] been booping up blood all night. I also have some little cramping in the lower left of my stomach.

## 2022-09-20 NOTE — Progress Notes (Addendum)
Received page/phone call from Dr. Vevelyn Royals (ED physician) to inform our team that IR has taken patient to perform embolization. He signed out the patient's story to our team. We will admit this patient to our service after her procedure. Will place temporary admission orders at this time.   Lockie Mola, MD  PGY-2 [facility]

## 2022-09-20 NOTE — ED Notes (Signed)
ED TO INPATIENT HANDOFF REPORT  ED Nurse Name and Phone #: Marguerita Merles  S Name/Age/Gender [name] 89 y.o. female Room/Bed: 024C/024C  Code Status   Code Status: Prior  Home/SNF/Other Home Patient oriented to: self, place, time, and situation Is this baseline? Yes   Triage Complete: Triage complete  Chief Complaint History of GI diverticular bleed [Z87.19]  Triage Note Pt. Stated, Lavenia Atlas been booping up blood all night. I also have some little cramping in the lower left of my stomach.   Allergies Allergies  Allergen Reactions   Ezetimibe Anaphylaxis    Other reaction(s): anaphylaxis   Lipitor [Atorvastatin] Swelling   Pravachol [Pravastatin] Swelling   Statins Swelling and Other (See Comments)    Swelling of mouth and lips Other reaction(s): anaphylaxis   Zocor [Simvastatin] Swelling   Cholestyramine Nausea And Vomiting    Level of Care/Admitting Diagnosis ED Disposition     ED Disposition  Admit   Condition  --   Comment  Hospital Area: [facility] [[id number]]  Level of Care: Progressive [102]  Admit to Progressive based on following criteria: CARDIOVASCULAR & THORACIC of moderate stability with acute coronary syndrome symptoms/low risk myocardial infarction/hypertensive urgency/arrhythmias/heart failure potentially compromising stability and stable post cardiovascular intervention patients.  May admit patient to [facility] or [facility] if equivalent level of care is available:: No  Covid Evaluation: Asymptomatic - no recent exposure (last 10 days) testing not required  Diagnosis: History of GI diverticular bleed [161096]  Admitting Physician: Lockie Mola [[id number]]  Attending Physician: Nestor Ramp [[id number]]  Certification:: I certify this patient is being admitted for an inpatient-only procedure  Estimated Length of Stay: 2          B Medical/Surgery History Past Medical History:  Diagnosis Date   Acid reflux    takes Nexium  daily   Anemia    takes Ferrous Sulfate daily   Aortic stenosis    mild AS pk grad 18, mean grad 9, AVA 1.32 cm 03/2015 echo (Dr. Jacinto Halim)   Arthritis    Chronic kidney disease (CKD), stage III (moderate) (HCC)    Diverticulitis    Hematochezia 03/2015   High cholesterol    can't take the meds   History of blood transfusion 03/2015   no abnormal reaction noted   History of GI diverticular bleed    HTN (hypertension)    takes Losartan-HCTZ and Metoprolol daily   Joint pain    Joint swelling    Nocturia    Numbness    in legs   Peripheral edema    occasionally   Shortness of breath dyspnea    occasionally and with exertion   Slow urinary stream    at times   Past Surgical History:  Procedure Laterality Date   ABCESS DRAINAGE     abdominal abcess   cataract surgery Bilateral    CHOLECYSTECTOMY     DILATION AND CURETTAGE OF UTERUS     ESOPHAGOGASTRODUODENOSCOPY N/A 02/23/2015   Procedure: ESOPHAGOGASTRODUODENOSCOPY (EGD);  Surgeon: Carman Ching, MD;  Location[facility] ENDOSCOPY;  Service: Endoscopy;  Laterality: N/A;   FLEXIBLE SIGMOIDOSCOPY N/A 02/24/2015   Procedure: FLEXIBLE SIGMOIDOSCOPY;  Surgeon: Carman Ching, MD;  Location: [facility] ENDOSCOPY;  Service: Endoscopy;  Laterality: N/A;   IR ANGIOGRAM SELECTIVE EACH ADDITIONAL VESSEL  03/06/2017   IR ANGIOGRAM SELECTIVE EACH ADDITIONAL VESSEL  03/06/2017   IR ANGIOGRAM SELECTIVE EACH ADDITIONAL VESSEL  03/06/2017   IR ANGIOGRAM SELECTIVE EACH ADDITIONAL VESSEL  03/08/2017  IR ANGIOGRAM VISCERAL SELECTIVE  03/06/2017   IR ANGIOGRAM VISCERAL SELECTIVE  03/06/2017   IR ANGIOGRAM VISCERAL SELECTIVE  03/06/2017   IR ANGIOGRAM VISCERAL SELECTIVE  03/08/2017   IR ANGIOGRAM VISCERAL SELECTIVE  03/08/2017   IR EMBO ART  VEN HEMORR LYMPH EXTRAV  INC GUIDE ROADMAPPING  03/06/2017   IR EMBO ART  VEN HEMORR LYMPH EXTRAV  INC GUIDE ROADMAPPING  03/08/2017   IR US GUIDE VASC ACCESS RIGHT  03/06/2017   IR US GUIDE VASC ACCESS RIGHT  03/08/2017   left knee  surgery     TOTAL HIP ARTHROPLASTY Left 05/17/2015   Procedure: LEFT TOTAL HIP ARTHROPLASTY ANTERIOR APPROACH;  Surgeon: Kathryne Hitch, MD;  Location: [facility] OR;  Service: Orthopedics;  Laterality: Left;     A IV Location/Drains/Wounds Patient Lines/Drains/Airways Status     Active Line/Drains/Airways     Name Placement date Placement time Site Days   Peripheral IV 09/20/22 20 G Anterior;Proximal;Right Forearm 09/20/22  0821  Forearm  less than 1   CVC Triple Lumen 09/20/22 Left Femoral 09/20/22  1200  -- less than 1            Intake/Output Last 24 hours No intake or output data in the 24 hours ending 09/20/22 1306  Labs/Imaging Results for orders placed or performed during the hospital encounter of 09/20/22 (from the past 48 hour(s))  POC occult blood, ED     Status: Abnormal   Collection Time: 09/20/22  8:00 AM  Result Value Ref Range   Fecal Occult Bld POSITIVE (A) NEGATIVE  CBC with Differential     Status: Abnormal   Collection Time: 09/20/22  8:22 AM  Result Value Ref Range   WBC 3.8 (L) 4.0 - 10.5 K/uL   RBC 2.96 (L) 3.87 - 5.11 MIL/uL   Hemoglobin 9.0 (L) 12.0 - 15.0 g/dL   HCT 65.7 (L) 84.6 - 96.2 %   MCV 95.9 80.0 - 100.0 fL   MCH 30.4 26.0 - 34.0 pg   MCHC 31.7 30.0 - 36.0 g/dL   RDW 95.2 84.1 - 32.4 %   Platelets 200 150 - 400 K/uL   nRBC 0.0 0.0 - 0.2 %   Neutrophils Relative % 67 %   Neutro Abs 2.6 1.7 - 7.7 K/uL   Lymphocytes Relative 19 %   Lymphs Abs 0.7 0.7 - 4.0 K/uL   Monocytes Relative 9 %   Monocytes Absolute 0.3 0.1 - 1.0 K/uL   Eosinophils Relative 4 %   Eosinophils Absolute 0.2 0.0 - 0.5 K/uL   Basophils Relative 1 %   Basophils Absolute 0.0 0.0 - 0.1 K/uL   Immature Granulocytes 0 %   Abs Immature Granulocytes 0.01 0.00 - 0.07 K/uL    Comment: Performed at [facility], [address], [place], [place] [address]  Comprehensive metabolic panel     Status: Abnormal   Collection Time: 09/20/22  8:22 AM  Result Value Ref Range    Sodium 141 135 - 145 mmol/L   Potassium 4.4 3.5 - 5.1 mmol/L   Chloride 108 98 - 111 mmol/L   CO2 22 22 - 32 mmol/L   Glucose, Bld 103 (H) 70 - 99 mg/dL    Comment: Glucose reference range applies only to samples taken after fasting for at least 8 hours.   BUN 23 8 - 23 mg/dL   Creatinine, Ser 7.25 (H) 0.44 - 1.00 mg/dL   Calcium 8.9 8.9 - 36.6 mg/dL   Total Protein  5.5 (L) 6.5 - 8.1 g/dL   Albumin 3.0 (L) 3.5 - 5.0 g/dL   AST 18 15 - 41 U/L   ALT 13 0 - 44 U/L   Alkaline Phosphatase 40 38 - 126 U/L   Total Bilirubin 0.4 0.3 - 1.2 mg/dL   GFR, Estimated 32 (L) >60 mL/min    Comment: (NOTE) Calculated using the CKD-EPI Creatinine Equation (2021)    Anion gap 11 5 - 15    Comment: Performed at [facility], [address] [place], [place] [address]  Magnesium     Status: None   Collection Time: 09/20/22  8:22 AM  Result Value Ref Range   Magnesium 1.9 1.7 - 2.4 mg/dL    Comment: Performed at [facility], [address] [place], [place] [address]  Urinalysis, w/ Reflex to Culture (Infection Suspected) -Urine, Clean Catch     Status: Abnormal   Collection Time: 09/20/22  8:22 AM  Result Value Ref Range   Specimen Source URINE, CLEAN CATCH    Color, Urine YELLOW YELLOW   APPearance HAZY (A) CLEAR   Specific Gravity, Urine 1.019 1.005 - 1.030   pH 5.0 5.0 - 8.0   Glucose, UA NEGATIVE NEGATIVE mg/dL   Hgb urine dipstick MODERATE (A) NEGATIVE   Bilirubin Urine NEGATIVE NEGATIVE   Ketones, ur NEGATIVE NEGATIVE mg/dL   Protein, ur NEGATIVE NEGATIVE mg/dL   Nitrite NEGATIVE NEGATIVE   Leukocytes,Ua TRACE (A) NEGATIVE   RBC / HPF 11-20 0 - 5 RBC/hpf   WBC, UA 6-10 0 - 5 WBC/hpf    Comment:        Reflex urine culture not performed if WBC <=10, OR if Squamous epithelial cells >5. If Squamous epithelial cells >5 suggest recollection.    Bacteria, UA MANY (A) NONE SEEN   Squamous Epithelial / HPF 6-10 0 - 5 /HPF   Mucus PRESENT     Comment: Performed at [facility], [address] [place], [place] [address]  Protime-INR     Status: None   Collection Time: 09/20/22  8:22 AM  Result Value Ref Range   Prothrombin Time 15.0 11.4 - 15.2 seconds   INR 1.2 0.8 - 1.2    Comment: (NOTE) INR goal varies based on device and disease states. Performed at [facility], [address], [place], [place] [address]   APTT     Status: None   Collection Time: 09/20/22  8:22 AM  Result Value Ref Range   aPTT 31 24 - 36 seconds    Comment: Performed at [facility], [address], [place], [place] [address]  I-stat chem 8, ED (not at [facility], [facility] or [facility])     Status: Abnormal   Collection Time: 09/20/22  8:32 AM  Result Value Ref Range   Sodium 140 135 - 145 mmol/L   Potassium 4.5 3.5 - 5.1 mmol/L   Chloride 108 98 - 111 mmol/L   BUN 27 (H) 8 - 23 mg/dL   Creatinine, Ser 1.30 (H) 0.44 - 1.00 mg/dL   Glucose, Bld 99 70 - 99 mg/dL    Comment: Glucose reference range applies only to samples taken after fasting for at least 8 hours.   Calcium, Ion 1.08 (L) 1.15 - 1.40 mmol/L   TCO2 24 22 - 32 mmol/L   Hemoglobin 8.8 (L) 12.0 - 15.0 g/dL   HCT 86.5 (L) 78.4 - 69.6 %  Type and screen [facility]     Status: None (Preliminary result)   Collection Time: 09/20/22 10:26 AM  Result Value Ref Range   ABO/RH(D) B POS    Antibody Screen NEG    Sample Expiration 09/23/2022,2359    Unit Number [id number]    Blood Component Type RED CELLS,LR    Unit division 00    Status of Unit ISSUED    Transfusion Status OK TO TRANSFUSE    Crossmatch Result Compatible    Unit Number [id number]    Blood Component Type RBC LR PHER2    Unit division 00    Status of Unit ISSUED    Transfusion Status OK TO TRANSFUSE    Crossmatch Result      Compatible Performed at [facility], [address], [place], [place] [address]    Unit Number [id number]    Blood Component Type RBC LR PHER2    Unit division 00    Status of Unit ISSUED     Transfusion Status OK TO TRANSFUSE    Crossmatch Result Compatible   Prepare RBC (crossmatch)     Status: None   Collection Time: 09/20/22 11:53 AM  Result Value Ref Range   Order Confirmation      ORDER PROCESSED BY BLOOD BANK Performed at [facility], [address], [place], [place] [address]   Prepare RBC     Status: None   Collection Time: 09/20/22 12:12 PM  Result Value Ref Range   Order Confirmation      ORDER PROCESSED BY BLOOD BANK Performed at [facility], [address] [place], [place] [address]    CT ANGIO GI BLEED  Result Date: 09/20/2022 CLINICAL DATA:  Lower GI bleed. EXAM: CTA ABDOMEN AND PELVIS WITHOUT AND WITH CONTRAST TECHNIQUE: Multidetector CT imaging of the abdomen and pelvis was performed using the standard protocol during bolus administration of intravenous contrast. Multiplanar reconstructed images and MIPs were obtained and reviewed to evaluate the vascular anatomy. RADIATION DOSE REDUCTION: This exam was performed according to the departmental dose-optimization program which includes automated exposure control, adjustment of the mA and/or kV according to patient size and/or use of iterative reconstruction technique. CONTRAST:  70mL OMNIPAQUE IOHEXOL 350 MG/ML SOLN COMPARISON:  CT abdomen/pelvis 01/22/2022. FINDINGS: VASCULAR Aorta: Normal caliber aorta without aneurysm, dissection, vasculitis or significant stenosis. Extensive atherosclerotic calcifications. Celiac: Patent without evidence of aneurysm, dissection, vasculitis or significant stenosis. SMA: Patent without evidence of aneurysm, dissection, vasculitis or significant stenosis. Renals: Calcified plaque results in moderate stenosis of the left renal artery and mild stenosis of the right renal artery. IMA: Patent without evidence of aneurysm, dissection, vasculitis or significant stenosis. Inflow: Patent without evidence of aneurysm, dissection, vasculitis or significant stenosis. Proximal  Outflow: Bilateral common femoral and visualized portions of the superficial and profunda femoral arteries are patent without evidence of aneurysm, dissection, vasculitis or significant stenosis. Veins: Normal. Review of the MIP images confirms the above findings. NON-VASCULAR Lower chest: No acute abnormality. Extensive coronary artery calcifications. Hepatobiliary: No focal liver abnormality is seen. Status post cholecystectomy. No biliary dilatation. Pancreas: Unremarkable. No pancreatic ductal dilatation or surrounding inflammatory changes. Spleen: Normal in size without focal abnormality. Adrenals/Urinary Tract: Adrenal glands are unremarkable. Kidneys are unremarkable without radiopaque stone or hydronephrosis. Bladder is decompressed. Stomach/Bowel: Unchanged moderate hiatal hernia. No dilated loops of small bowel. Appendix is not visualized. Severe pancolonic diverticulosis. Active arterial extravasation of contrast within the hepatic flexure (axial image 65 series 10). No bowel  wall thickening or surrounding inflammation to suggest acute diverticulitis. Lymphatic: No abdominal or pelvic lymphadenopathy. Reproductive: Status post hysterectomy. No adnexal masses. Other: No abdominal wall hernia or abnormality. No abdominopelvic ascites. Musculoskeletal: Left total hip arthroplasty. IMPRESSION: 1. Active arterial extravasation of contrast within the hepatic flexure of the colon. 2. Severe pancolonic diverticulosis without evidence of acute diverticulitis. 3. Unchanged moderate hiatal hernia. 4. Coronary artery disease. Aortic Atherosclerosis (ICD10-I70.0). Electronically Signed   By: Orvan Falconer M.D.   On: 09/20/2022 09:37    Pending Labs Unresulted Labs (From admission, onward)     Start     Ordered   09/20/22 1154  CBC with Differential/Platelet  ONCE - STAT,   STAT        09/20/22 1215            Vitals/Pain Today's Vitals   09/20/22 1225 09/20/22 1230 09/20/22 1235 09/20/22 1240  BP:       Pulse: (!) 54 (!) 54 (!) 54 (!) 56  Resp: 20 (!) 23 (!) 21 18  Temp:      TempSrc:      SpO2: 100% 100% 97% 94%  PainSc: 3        Isolation Precautions No active isolations  Medications Medications  lidocaine-EPINEPHrine 1.5 %-1:200000 injection 30 mL (has no administration in time range)  0.9 %  sodium chloride infusion (Manually program via Guardrails IV Fluids) (has no administration in time range)  0.9 %  sodium chloride infusion (Manually program via Guardrails IV Fluids) (has no administration in time range)  0.9 %  sodium chloride infusion (Manually program via Guardrails IV Fluids) (has no administration in time range)  alum & mag hydroxide-simeth (MAALOX/MYLANTA) 200-200-20 MG/5ML suspension 30 mL (has no administration in time range)  dicyclomine (BENTYL) 10 MG/5ML solution 10 mg (has no administration in time range)  iohexol (OMNIPAQUE) 350 MG/ML injection 70 mL (70 mLs Intravenous Contrast Given 09/20/22 0918)  iohexol (OMNIPAQUE) 300 MG/ML solution 150 mL (44 mLs Intravenous Contrast Given 09/20/22 1226)  lidocaine (XYLOCAINE) 1 % (with pres) injection 20 mL (10 mLs Intradermal Given 09/20/22 1227)  iohexol (OMNIPAQUE) 300 MG/ML solution 100 mL (1 mL Intra-arterial Contrast Given 09/20/22 1248)    Mobility walks with device     Focused Assessments G/I   R Recommendations: See Admitting Provider Note  Report given to:   Additional Notes:

## 2022-09-21 ENCOUNTER — Other Ambulatory Visit: Payer: Self-pay | Admitting: Student

## 2022-09-21 ENCOUNTER — Encounter (HOSPITAL_COMMUNITY): Payer: Self-pay | Admitting: Family Medicine

## 2022-09-21 DIAGNOSIS — D62 Acute posthemorrhagic anemia: Secondary | ICD-10-CM

## 2022-09-21 DIAGNOSIS — K625 Hemorrhage of anus and rectum: Secondary | ICD-10-CM | POA: Diagnosis not present

## 2022-09-21 LAB — HEMOGLOBIN AND HEMATOCRIT, BLOOD
HCT: 26.8 % — ABNORMAL LOW (ref 36.0–46.0)
Hemoglobin: 8.8 g/dL — ABNORMAL LOW (ref 12.0–15.0)

## 2022-09-21 LAB — PREPARE RBC (CROSSMATCH)

## 2022-09-21 MED ORDER — METOPROLOL SUCCINATE ER 25 MG PO TB24
12.5000 mg | ORAL_TABLET | Freq: Every day | ORAL | Status: DC
  Administered 2022-09-22 – 2022-09-24 (×3): 12.5 mg via ORAL
  Filled 2022-09-21 (×3): qty 1

## 2022-09-21 MED ORDER — SODIUM CHLORIDE 0.9% IV SOLUTION: Freq: Once | INTRAVENOUS | Status: AC

## 2022-09-21 MED ORDER — ALUM HYDROXIDE-MAG CARBONATE 95-358 MG/15ML PO SUSP: 30.0000 mL | Freq: Every day | ORAL | Status: DC | PRN

## 2022-09-21 MED ORDER — CHLORHEXIDINE GLUCONATE CLOTH 2 % EX PADS
6.0000 | MEDICATED_PAD | Freq: Every day | CUTANEOUS | Status: DC
  Administered 2022-09-21 – 2022-09-23 (×3): 6 via TOPICAL

## 2022-09-21 MED ORDER — ORAL CARE MOUTH RINSE: 15.0000 mL | OROMUCOSAL | Status: DC | PRN

## 2022-09-21 MED ORDER — ALUM HYDROXIDE-MAG CARBONATE 95-358 MG/15ML PO SUSP: 30.0000 mL | ORAL | Status: DC | PRN

## 2022-09-21 MED ORDER — CALCIUM CARBONATE ANTACID 500 MG PO CHEW
1.0000 | CHEWABLE_TABLET | Freq: Four times a day (QID) | ORAL | Status: DC | PRN
  Administered 2022-09-22: 200 mg via ORAL
  Filled 2022-09-21: qty 1

## 2022-09-21 NOTE — Progress Notes (Signed)
FMTS Brief Progress Note  S: Saw patient on night rounds with Dr. Elliot Gurney.  Patient resting comfortably in recliner.  States she needs to urinate and is waiting for her dinner tray.  Asked about diet, explained to patient that we will slowly advance her diet from liquids to pured to solids.  Patient does not recall having bowel movement although nurse states she had a formed bowel movement with scant blood earlier today.  Patient states she is having some pain in her right hand, she states she usually wears a brace at home.  Nursing gave her a heating pack for this.  Patient denies headache, abdominal pain, and chest pain.   O: BP (!) 119/51 (BP Location: Right Arm)   Pulse (!) 56   Temp 98 F (36.7 C) (Oral)   Resp 17   SpO2 100%    Physical Exam Constitutional:      Appearance: Normal appearance.  Cardiovascular:     Rate and Rhythm: Normal rate and regular rhythm.     Heart sounds: Normal heart sounds.  Pulmonary:     Effort: Pulmonary effort is normal.     Breath sounds: Normal breath sounds and air entry.  Abdominal:     General: Bowel sounds are normal. There is no distension.     Palpations: Abdomen is soft.     Tenderness: There is no abdominal tenderness.  Musculoskeletal:     Right lower leg: No swelling.     Left lower leg: No swelling.  Neurological:     Mental Status: She is alert.    A/P: Diverticular bleed Status post mesenteric embolization with IR on 8/8,  received 1 unit of PRBCs during the procedure Received another unit of PRBCs this morning due to hemoglobin 7.8 Posttransfusion hemoglobin 8.8 Nursing noted scant blood in bowel movement today Vitals have been stable today Holding home antihypertensives due to previous hypotension and ongoing bradycardia, added back lower dose of metoprolol (12.5 mg) today -Will DC metoprolol if heart rate falls below 45 -Will give fluids if map falls below 65 -GI consulted recommend slowly advancing diet if no signs of  bleeding tomorrow (8/10), avoid aspirin and NSAIDs.  GI will see patient tomorrow. -AM CBC -Transfusion threshold less than 8  Chronic heart failure (HFpEF) EF 60 to 65% in 2021 -Holding home Imdur and Lasix -Continue to monitor vitals  Lorayne Bender, MD 09/21/2022, 8:48 PM PGY-1, [facility] Night Resident  Please page [phone] with questions.

## 2022-09-21 NOTE — Progress Notes (Signed)
Referring Physician(s): Dr Audrie Lia; Dr Katherina Right  Supervising Physician: Richarda Overlie  Patient Status:  [facility] - In-pt  Chief Complaint:  LGIB, positive CTA S/p Superior Mesenteric Angiogram and coil embolization of right colic branch and left femoral central venous catheter placement by Dr. Bryn Gulling   Subjective:  Patient sitting in bed, NAD.  Reports that she is doing fine, just needs to go to bathroom. Denies SOB or lightheadedness.  Did not have BM overnight.   Allergies: Ezetimibe, Lipitor [atorvastatin], Pravachol [pravastatin], Statins, Zocor [simvastatin], and Cholestyramine  Medications: Prior to Admission medications   Medication Sig Start Date End Date Taking? Authorizing Provider  acetaminophen (TYLENOL) 650 MG CR tablet Take 650 mg by mouth daily.   Yes [provider]  albuterol (VENTOLIN HFA) 108 (90 Base) MCG/ACT inhaler Inhale 1-2 puffs into the lungs every 6 (six) hours as needed for wheezing or shortness of breath. 09/14/22  Yes Erick Alley, DO  Alum Hydroxide-Mag Carbonate (GAVISCON PO) Take 1 tablet by mouth daily as needed (acid reflux).   Yes [provider]  Ascorbic Acid (VITAMIN C) 1000 MG tablet Take 1,000 mg by mouth daily.   Yes [provider]  diclofenac sodium (VOLTAREN) 1 % GEL Apply 2 g topically 2 (two) times daily as needed (pain).   Yes [provider]  ferrous sulfate 325 (65 FE) MG EC tablet Take 1 tablet (325 mg total) by mouth daily with breakfast. 07/20/22  Yes Erick Alley, DO  isosorbide mononitrate (IMDUR) 120 MG 24 hr tablet Take 1 tablet (120 mg total) by mouth daily. 09/13/22  Yes Nahser, Deloris Ping, MD  metoprolol succinate (TOPROL XL) 25 MG 24 hr tablet Take 1 tablet (25 mg total) by mouth daily. 09/18/22  Yes Nahser, Deloris Ping, MD  Multiple Vitamin (MULITIVITAMIN WITH MINERALS) TABS Take 1 tablet by mouth daily.   Yes [provider]  nitroGLYCERIN (NITROSTAT) 0.4 MG SL tablet Place 1 tablet (0.4 mg total)  under the tongue every 5 (five) minutes as needed for chest pain. 09/14/22  Yes Erick Alley, DO  polyvinyl alcohol (LIQUIFILM TEARS) 1.4 % ophthalmic solution Place 1 drop into both eyes as needed for dry eyes.   Yes [provider]  Calcium Carbonate Antacid (GAVISCON ACID BRKTHRGH FORMULA PO) Take 1 tablet by mouth as needed. Patient not taking: Reported on 09/20/2022    [provider]  Cholecalciferol (VITAMIN D3) 2000 units TABS Take 2,000 Units by mouth daily. Patient not taking: Reported on 09/20/2022    [provider]  famotidine (PEPCID) 20 MG tablet Take 20 mg by mouth 2 (two) times daily. Patient not taking: Reported on 09/20/2022 04/12/22   [provider]  furosemide (LASIX) 20 MG tablet Take 1 tablet (20 mg total) by mouth daily as needed for edema. Patient not taking: Reported on 09/20/2022 09/13/22   Nahser, Deloris Ping, MD  lidocaine (LIDODERM) 5 % Place 1 patch onto the skin daily. Remove & Discard patch within 12 hours or as directed by MD Patient not taking: Reported on 10/10/2021 09/22/20   Erick Alley, DO     Vital Signs: BP (!) 127/51   Pulse (!) 57   Temp 98.4 F (36.9 C) (Oral)   Resp 16   SpO2 100%   Physical Exam Vitals reviewed.  Constitutional:      General: She is not in acute distress.    Appearance: She is not ill-appearing.  HENT:     Head: Normocephalic and atraumatic.  Cardiovascular:     Rate and Rhythm: Normal rate.  Pulmonary:     Effort: Pulmonary effort is normal.  Abdominal:     General: Abdomen is flat.  Skin:    General: Skin is warm and dry.     Comments: Positive dressing on R CFA puncture site. Site is unremarkable with no erythema, edema, tenderness, bleeding or drainage. Minimal amount of old, dry blood noted on the dressing. Dressing otherwise clean, dry, and intact. R DP 2+    Neurological:     Mental Status: She is alert.  Psychiatric:        Mood and Affect: Mood normal.        Behavior: Behavior  normal.     Imaging: IR US Guide Vasc Access Left  Result Date: 09/20/2022 INDICATION: GI bleed.  Hypotension.  Resuscitation. EXAM: NON-TUNNELED CENTRAL VENOUS CATHETER PLACEMENT WITH ULTRASOUND AND FLUOROSCOPIC GUIDANCE COMPARISON:  CT AP, earlier same day. MEDICATIONS: 5 mL lidocaine 1% FLUOROSCOPY TIME:  Fluoroscopic dose; 0 mGy COMPLICATIONS: None immediate. PROCEDURE: Informed written consent was obtained from the patient and/or patient's representative after a discussion of the risks, benefits, and alternatives to treatment. Questions regarding the procedure were encouraged and answered. The LEFT groin was prepped with chlorhexidine in a sterile fashion, and a sterile drape was applied covering the operative field. Maximum barrier sterile technique with sterile gowns and gloves were used for the procedure. A timeout was performed prior to the initiation of the procedure. After the overlying soft tissues were anesthetized, a small venotomy incision was created and a micropuncture kit was utilized to access the LEFT greater saphenous vein. Real-time ultrasound guidance was utilized for vascular access including the acquisition of a permanent ultrasound image documenting patency of the accessed vessel. The microwire was utilized to measure appropriate catheter length. A stiff glidewire was advanced to the level of the IVC. Under fluoroscopic guidance, the venotomy was serially dilated, ultimately allowing placement of a 16 cm triple-lumen non tunneled CVC with tip ultimately terminating within the LEFT pelvic veins. Final catheter positioning was confirmed and documented with a spot radiographic image. The catheter aspirates and flushes normally. The catheter was flushed heparinized saline. The catheter exit site was secured with a 2-0-Silk retention suture. A dressing was placed. The patient tolerated the procedure well without immediate post procedural complication. IMPRESSION: Successful placement of a  LEFT femoral vein approach non-tunneled central venous catheter for resuscitation. The catheter is ready for immediate use. Roanna Banning, MD [facility] Radiology Electronically Signed   By: Roanna Banning M.D.   On: 09/20/2022 14:33   IR Angiogram Visceral Selective  Result Date: 09/20/2022 INDICATION: 89 year old woman with acute lower GI bleed presents to interventional radiology for angiogram and possible embolization. CT angiography performed on 09/20/2022 shows active extravasation in the distal ascending colon, likely in the right colic territory. EXAM: 1. Ultrasound-guided access of right common femoral artery 2. Superior mesenteric angiogram 3. Right colic angiogram and branch embolization MEDICATIONS: None ANESTHESIA/SEDATION: None CONTRAST:  44 mL of Omnipaque 300 FLUOROSCOPY: Radiation Exposure Index (as provided by the fluoroscopic device): 170 mGy Kerma COMPLICATIONS: None immediate. PROCEDURE: Informed consent was obtained from the patient following explanation of the procedure, risks, benefits and alternatives. The patient understands, agrees and consents for the procedure. All questions were addressed. A time out was performed prior to the initiation of the procedure. Maximal barrier sterile technique utilized including caps, mask, sterile gowns, sterile gloves, large sterile drape, hand hygiene, and  chlorhexidine prep. Ultrasound image documenting patency of the right common femoral artery was obtained and placed in permanent medical record. Sterile ultrasound probe cover and gel utilized throughout the procedure. Utilizing continuous ultrasound guidance, the right common femoral artery was accessed at the level of the femoral head with a 21 gauge needle. 21 gauge needle exchanged for a transitional dilator set over 0.018 inch guidewire. Transitional dilator set exchanged for 5 French sheath over 0.035 inch guidewire. The superior mesenteric artery  was selected with VS1 catheter. Superior mesenteric angiogram showed patent small and large bowel branches. Active extravasation was seen from the right colic branch as expected based on the CT. The right colic branch was selected with Progreat microcatheter. Access was advanced as distally as possible into the branch responsible for the hemorrhage and embolization was performed with one 3 mm x 5 cm detachable Ruby coil. At this point the base catheter access was lost, therefore microcatheter was removed. The superior mesenteric artery with again engaged with the VS 1 catheter. Lantern microcatheter was advanced back into the right colic artery. Repeat angiogram showed no additional hemorrhage. Lantern microcatheter was removed and superior mesenteric angiogram was repeated which showed no additional site of hemorrhage. The patient became more hypotensive at this point. Rapid response was consulted. Patient was given bolus of fluid as well as 1 unit of packed red blood cells. Her blood pressure improved. Given her suboptimal IV access, a left groin central venous catheter was placed. The patient's condition continued to improve and she did not need further intervention. Right groin access was removed and hemostasis achieved with 20 minutes of manual compression. IMPRESSION: Successful embolization of right colic branch for treatment of acute lower GI bleed. Electronically Signed   By: Acquanetta Belling M.D.   On: 09/20/2022 14:17   IR US Guide Vasc Access Right  Result Date: 09/20/2022 INDICATION: 89 year old woman with acute lower GI bleed presents to interventional radiology for angiogram and possible embolization. CT angiography performed on 09/20/2022 shows active extravasation in the distal ascending colon, likely in the right colic territory. EXAM: 1. Ultrasound-guided access of right common femoral artery 2. Superior mesenteric angiogram 3. Right colic angiogram and branch embolization MEDICATIONS: None  ANESTHESIA/SEDATION: None CONTRAST:  44 mL of Omnipaque 300 FLUOROSCOPY: Radiation Exposure Index (as provided by the fluoroscopic device): 170 mGy Kerma COMPLICATIONS: None immediate. PROCEDURE: Informed consent was obtained from the patient following explanation of the procedure, risks, benefits and alternatives. The patient understands, agrees and consents for the procedure. All questions were addressed. A time out was performed prior to the initiation of the procedure. Maximal barrier sterile technique utilized including caps, mask, sterile gowns, sterile gloves, large sterile drape, hand hygiene, and chlorhexidine prep. Ultrasound image documenting patency of the right common femoral artery was obtained and placed in permanent medical record. Sterile ultrasound probe cover and gel utilized throughout the procedure. Utilizing continuous ultrasound guidance, the right common femoral artery was accessed at the level of the femoral head with a 21 gauge needle. 21 gauge needle exchanged for a transitional dilator set over 0.018 inch guidewire. Transitional dilator set exchanged for 5 French sheath over 0.035 inch guidewire. The superior mesenteric artery was selected with VS1 catheter. Superior mesenteric angiogram showed patent small and large bowel branches. Active extravasation was seen from the right colic branch as expected based on the CT. The right colic branch was selected with Progreat microcatheter. Access was advanced as distally as possible into the branch responsible for the hemorrhage and  embolization was performed with one 3 mm x 5 cm detachable Ruby coil. At this point the base catheter access was lost, therefore microcatheter was removed. The superior mesenteric artery with again engaged with the VS 1 catheter. Lantern microcatheter was advanced back into the right colic artery. Repeat angiogram showed no additional hemorrhage. Lantern microcatheter was removed and superior mesenteric angiogram was  repeated which showed no additional site of hemorrhage. The patient became more hypotensive at this point. Rapid response was consulted. Patient was given bolus of fluid as well as 1 unit of packed red blood cells. Her blood pressure improved. Given her suboptimal IV access, a left groin central venous catheter was placed. The patient's condition continued to improve and she did not need further intervention. Right groin access was removed and hemostasis achieved with 20 minutes of manual compression. IMPRESSION: Successful embolization of right colic branch for treatment of acute lower GI bleed. Electronically Signed   By: Acquanetta Belling M.D.   On: 09/20/2022 14:17   IR EMBO ART  VEN HEMORR LYMPH EXTRAV  INC GUIDE ROADMAPPING  Result Date: 09/20/2022 INDICATION: 89 year old woman with acute lower GI bleed presents to interventional radiology for angiogram and possible embolization. CT angiography performed on 09/20/2022 shows active extravasation in the distal ascending colon, likely in the right colic territory. EXAM: 1. Ultrasound-guided access of right common femoral artery 2. Superior mesenteric angiogram 3. Right colic angiogram and branch embolization MEDICATIONS: None ANESTHESIA/SEDATION: None CONTRAST:  44 mL of Omnipaque 300 FLUOROSCOPY: Radiation Exposure Index (as provided by the fluoroscopic device): 170 mGy Kerma COMPLICATIONS: None immediate. PROCEDURE: Informed consent was obtained from the patient following explanation of the procedure, risks, benefits and alternatives. The patient understands, agrees and consents for the procedure. All questions were addressed. A time out was performed prior to the initiation of the procedure. Maximal barrier sterile technique utilized including caps, mask, sterile gowns, sterile gloves, large sterile drape, hand hygiene, and chlorhexidine prep. Ultrasound image documenting patency of the right common femoral artery was obtained and placed in permanent medical  record. Sterile ultrasound probe cover and gel utilized throughout the procedure. Utilizing continuous ultrasound guidance, the right common femoral artery was accessed at the level of the femoral head with a 21 gauge needle. 21 gauge needle exchanged for a transitional dilator set over 0.018 inch guidewire. Transitional dilator set exchanged for 5 French sheath over 0.035 inch guidewire. The superior mesenteric artery was selected with VS1 catheter. Superior mesenteric angiogram showed patent small and large bowel branches. Active extravasation was seen from the right colic branch as expected based on the CT. The right colic branch was selected with Progreat microcatheter. Access was advanced as distally as possible into the branch responsible for the hemorrhage and embolization was performed with one 3 mm x 5 cm detachable Ruby coil. At this point the base catheter access was lost, therefore microcatheter was removed. The superior mesenteric artery with again engaged with the VS 1 catheter. Lantern microcatheter was advanced back into the right colic artery. Repeat angiogram showed no additional hemorrhage. Lantern microcatheter was removed and superior mesenteric angiogram was repeated which showed no additional site of hemorrhage. The patient became more hypotensive at this point. Rapid response was consulted. Patient was given bolus of fluid as well as 1 unit of packed red blood cells. Her blood pressure improved. Given her suboptimal IV access, a left groin central venous catheter was placed. The patient's condition continued to improve and she did not need further intervention. Right  groin access was removed and hemostasis achieved with 20 minutes of manual compression. IMPRESSION: Successful embolization of right colic branch for treatment of acute lower GI bleed. Electronically Signed   By: Acquanetta Belling M.D.   On: 09/20/2022 14:17   IR Fluoro Guide CV Line Left  Result Date: 09/20/2022 INDICATION: GI  bleed.  Hypotension.  Resuscitation. EXAM: NON-TUNNELED CENTRAL VENOUS CATHETER PLACEMENT WITH ULTRASOUND AND FLUOROSCOPIC GUIDANCE COMPARISON:  CT AP, earlier same day. MEDICATIONS: 5 mL lidocaine 1% FLUOROSCOPY TIME:  Fluoroscopic dose; 0 mGy COMPLICATIONS: None immediate. PROCEDURE: Informed written consent was obtained from the patient and/or patient's representative after a discussion of the risks, benefits, and alternatives to treatment. Questions regarding the procedure were encouraged and answered. The LEFT groin was prepped with chlorhexidine in a sterile fashion, and a sterile drape was applied covering the operative field. Maximum barrier sterile technique with sterile gowns and gloves were used for the procedure. A timeout was performed prior to the initiation of the procedure. After the overlying soft tissues were anesthetized, a small venotomy incision was created and a micropuncture kit was utilized to access the LEFT greater saphenous vein. Real-time ultrasound guidance was utilized for vascular access including the acquisition of a permanent ultrasound image documenting patency of the accessed vessel. The microwire was utilized to measure appropriate catheter length. A stiff glidewire was advanced to the level of the IVC. Under fluoroscopic guidance, the venotomy was serially dilated, ultimately allowing placement of a 16 cm triple-lumen non tunneled CVC with tip ultimately terminating within the LEFT pelvic veins. Final catheter positioning was confirmed and documented with a spot radiographic image. The catheter aspirates and flushes normally. The catheter was flushed heparinized saline. The catheter exit site was secured with a 2-0-Silk retention suture. A dressing was placed. The patient tolerated the procedure well without immediate post procedural complication. IMPRESSION: Successful placement of a LEFT femoral vein approach non-tunneled central venous catheter for resuscitation. The catheter  is ready for immediate use. Roanna Banning, MD [facility] Radiology Electronically Signed   By: Roanna Banning M.D.   On: 09/20/2022 13:13   CT ANGIO GI BLEED  Result Date: 09/20/2022 CLINICAL DATA:  Lower GI bleed. EXAM: CTA ABDOMEN AND PELVIS WITHOUT AND WITH CONTRAST TECHNIQUE: Multidetector CT imaging of the abdomen and pelvis was performed using the standard protocol during bolus administration of intravenous contrast. Multiplanar reconstructed images and MIPs were obtained and reviewed to evaluate the vascular anatomy. RADIATION DOSE REDUCTION: This exam was performed according to the departmental dose-optimization program which includes automated exposure control, adjustment of the mA and/or kV according to patient size and/or use of iterative reconstruction technique. CONTRAST:  70mL OMNIPAQUE IOHEXOL 350 MG/ML SOLN COMPARISON:  CT abdomen/pelvis 01/22/2022. FINDINGS: VASCULAR Aorta: Normal caliber aorta without aneurysm, dissection, vasculitis or significant stenosis. Extensive atherosclerotic calcifications. Celiac: Patent without evidence of aneurysm, dissection, vasculitis or significant stenosis. SMA: Patent without evidence of aneurysm, dissection, vasculitis or significant stenosis. Renals: Calcified plaque results in moderate stenosis of the left renal artery and mild stenosis of the right renal artery. IMA: Patent without evidence of aneurysm, dissection, vasculitis or significant stenosis. Inflow: Patent without evidence of aneurysm, dissection, vasculitis or significant stenosis. Proximal Outflow: Bilateral common femoral and visualized portions of the superficial and profunda femoral arteries are patent without evidence of aneurysm, dissection, vasculitis or significant stenosis. Veins: Normal. Review of the MIP images confirms the above findings. NON-VASCULAR Lower chest: No acute abnormality. Extensive coronary artery calcifications.  Hepatobiliary:  No focal liver abnormality is seen. Status post cholecystectomy. No biliary dilatation. Pancreas: Unremarkable. No pancreatic ductal dilatation or surrounding inflammatory changes. Spleen: Normal in size without focal abnormality. Adrenals/Urinary Tract: Adrenal glands are unremarkable. Kidneys are unremarkable without radiopaque stone or hydronephrosis. Bladder is decompressed. Stomach/Bowel: Unchanged moderate hiatal hernia. No dilated loops of small bowel. Appendix is not visualized. Severe pancolonic diverticulosis. Active arterial extravasation of contrast within the hepatic flexure (axial image 65 series 10). No bowel wall thickening or surrounding inflammation to suggest acute diverticulitis. Lymphatic: No abdominal or pelvic lymphadenopathy. Reproductive: Status post hysterectomy. No adnexal masses. Other: No abdominal wall hernia or abnormality. No abdominopelvic ascites. Musculoskeletal: Left total hip arthroplasty. IMPRESSION: 1. Active arterial extravasation of contrast within the hepatic flexure of the colon. 2. Severe pancolonic diverticulosis without evidence of acute diverticulitis. 3. Unchanged moderate hiatal hernia. 4. Coronary artery disease. Aortic Atherosclerosis (ICD10-I70.0). Electronically Signed   By: Orvan Falconer M.D.   On: 09/20/2022 09:37    Labs:  CBC: Recent Labs    07/20/22 1131 09/20/22 0822 09/20/22 0832 09/20/22 1305 09/20/22 1610 09/21/22 0333  WBC 3.3* 3.8*  --  6.1  --  5.4  HGB 10.1* 9.0* 8.8* 8.8* 9.5* 7.7*  HCT 31.5* 28.4* 26.0* 27.8* 29.3* 23.1*  PLT 348 200  --  172  --  144*    COAGS: Recent Labs    12/05/21 0507 09/20/22 0822  INR 1.0 1.2  APTT  --  31    BMP: Recent Labs    12/05/21 0507 07/20/22 1131 09/20/22 0822 09/20/22 0832 09/21/22 0333  NA 141 141 141 140 139  K 4.1 4.5 4.4 4.5 4.3  CL 104 104 108 108 106  CO2 25 24 22   --  24  GLUCOSE 118* 93 103* 99 95  BUN 20 25 23  27* 25*  CALCIUM 9.9 9.7 8.9  --   8.2*  CREATININE 1.54* 1.47* 1.49* 1.60* 1.50*  GFRNONAA 31*  --  32*  --  32*    LIVER FUNCTION TESTS: Recent Labs    12/05/21 0507 09/20/22 0822  BILITOT 0.6 0.4  AST 20 18  ALT 11 13  ALKPHOS 50 40  PROT 6.8 5.5*  ALBUMIN 3.7 3.0*    Assessment and Plan:  89 y.o. female with diverticula, LGIB, positive CTA  hepatic flexure of the colon, s/p Superior Mesenteric Angiogram and coil embolization of right colic branch and left femoral central venous catheter placement by Dr. Bryn Gulling on 09/20/22.   VSS overnight, hgb dropped to 7.7 from 8.8 yesterday, receiving blood.  RF stable.  R CFA puncture site stable, no appreciable pseudoaneurysm or hematoma, R DP 2+.  RN asks if left femoral line can be removed, recommended keeping it in place until hgb is in 8s at least.   Patient has not had BM after the procedure, please call IR if patient continues to have bloody BM becomes hemodynamically unstable.  Further treatment plan per primary team.  Appreciate and agree with the plan.  Please call IR for questions and concerns.    Electronically Signed: Willette Brace, PA-C 09/21/2022, 10:23 AM   I spent a total of 15 Minutes at the the patient's bedside AND on the patient's hospital floor or unit, greater than 50% of which was counseling/coordinating care for LGIB f/u.   This chart was dictated using voice recognition software.  Despite best efforts to proofread,  errors can occur which can change the documentation meaning.

## 2022-09-21 NOTE — Consult Note (Signed)
Reason for Consult: Diverticular bleed Referring Physician: Hospital team  [name] is an 89 y.o. female.  HPI: Patient seen and examined and her hospital computer chart reviewed and thanks to IR for their work and she did have a little blood in her bowels today but much less than previously and she has a history of diverticular bleeds and has had colonoscopies in the past and currently is tolerating clear liquids and has no other complaints colonoscopy and endoscopy from 2017 reviewed as well as virtual colonoscopy and recent CT Past Medical History:  Diagnosis Date   Acid reflux    takes Nexium daily   Anemia    takes Ferrous Sulfate daily   Aortic stenosis    mild AS pk grad 18, mean grad 9, AVA 1.32 cm 03/2015 echo (Dr. Jacinto Halim)   Arthritis    Chronic kidney disease (CKD), stage III (moderate) (HCC)    Diverticulitis    Hematochezia 03/2015   High cholesterol    can't take the meds   History of blood transfusion 03/2015   no abnormal reaction noted   History of GI diverticular bleed    HTN (hypertension)    takes Losartan-HCTZ and Metoprolol daily   Joint pain    Joint swelling    Nocturia    Numbness    in legs   Peripheral edema    occasionally   Shortness of breath dyspnea    occasionally and with exertion   Slow urinary stream    at times    Past Surgical History:  Procedure Laterality Date   ABCESS DRAINAGE     abdominal abcess   cataract surgery Bilateral    CHOLECYSTECTOMY     DILATION AND CURETTAGE OF UTERUS     ESOPHAGOGASTRODUODENOSCOPY N/A 02/23/2015   Procedure: ESOPHAGOGASTRODUODENOSCOPY (EGD);  Surgeon: Carman Ching, MD;  [facility] ENDOSCOPY;  Service: Endoscopy;  Laterality: N/A;   FLEXIBLE SIGMOIDOSCOPY N/A 02/24/2015   Procedure: FLEXIBLE SIGMOIDOSCOPY;  Surgeon: Carman Ching, MD;  Location: [facility] ENDOSCOPY;  Service: Endoscopy;  Laterality: N/A;   IR ANGIOGRAM SELECTIVE EACH ADDITIONAL VESSEL  03/06/2017   IR ANGIOGRAM SELECTIVE EACH ADDITIONAL  VESSEL  03/06/2017   IR ANGIOGRAM SELECTIVE EACH ADDITIONAL VESSEL  03/06/2017   IR ANGIOGRAM SELECTIVE EACH ADDITIONAL VESSEL  03/08/2017   IR ANGIOGRAM VISCERAL SELECTIVE  03/06/2017   IR ANGIOGRAM VISCERAL SELECTIVE  03/06/2017   IR ANGIOGRAM VISCERAL SELECTIVE  03/06/2017   IR ANGIOGRAM VISCERAL SELECTIVE  03/08/2017   IR ANGIOGRAM VISCERAL SELECTIVE  03/08/2017   IR ANGIOGRAM VISCERAL SELECTIVE  09/20/2022   IR EMBO ART  VEN HEMORR LYMPH EXTRAV  INC GUIDE ROADMAPPING  03/06/2017   IR EMBO ART  VEN HEMORR LYMPH EXTRAV  INC GUIDE ROADMAPPING  03/08/2017   IR EMBO ART  VEN HEMORR LYMPH EXTRAV  INC GUIDE ROADMAPPING  09/20/2022   IR FLUORO GUIDE CV LINE LEFT  09/20/2022   IR US GUIDE VASC ACCESS LEFT  09/20/2022   IR US GUIDE VASC ACCESS RIGHT  03/06/2017   IR US GUIDE VASC ACCESS RIGHT  03/08/2017   IR US GUIDE VASC ACCESS RIGHT  09/20/2022   left knee surgery     TOTAL HIP ARTHROPLASTY Left 05/17/2015   Procedure: LEFT TOTAL HIP ARTHROPLASTY ANTERIOR APPROACH;  Surgeon: Kathryne Hitch, MD;  Location: [facility] OR;  Service: Orthopedics;  Laterality: Left;    Family History  Problem Relation Age of Onset   Diabetes Other    Hypertension Other  Cancer Other    Heart disease Other     Social History:  reports that she has quit smoking. She has been exposed to tobacco smoke. She has never used smokeless tobacco. She reports that she does not drink alcohol and does not use drugs.  Allergies:  Allergies  Allergen Reactions   Ezetimibe Anaphylaxis    Other reaction(s): anaphylaxis   Lipitor [Atorvastatin] Swelling   Pravachol [Pravastatin] Swelling   Statins Swelling and Other (See Comments)    Swelling of mouth and lips Other reaction(s): anaphylaxis   Zocor [Simvastatin] Swelling   Cholestyramine Nausea And Vomiting    Medications: I have reviewed the patient's current medications.  Results for orders placed or performed during the hospital encounter of 09/20/22 (from the past 48 hour(s))   POC occult blood, ED     Status: Abnormal   Collection Time: 09/20/22  8:00 AM  Result Value Ref Range   Fecal Occult Bld POSITIVE (A) NEGATIVE  CBC with Differential     Status: Abnormal   Collection Time: 09/20/22  8:22 AM  Result Value Ref Range   WBC 3.8 (L) 4.0 - 10.5 K/uL   RBC 2.96 (L) 3.87 - 5.11 MIL/uL   Hemoglobin 9.0 (L) 12.0 - 15.0 g/dL   HCT 96.0 (L) 45.4 - 09.8 %   MCV 95.9 80.0 - 100.0 fL   MCH 30.4 26.0 - 34.0 pg   MCHC 31.7 30.0 - 36.0 g/dL   RDW 11.9 14.7 - 82.9 %   Platelets 200 150 - 400 K/uL   nRBC 0.0 0.0 - 0.2 %   Neutrophils Relative % 67 %   Neutro Abs 2.6 1.7 - 7.7 K/uL   Lymphocytes Relative 19 %   Lymphs Abs 0.7 0.7 - 4.0 K/uL   Monocytes Relative 9 %   Monocytes Absolute 0.3 0.1 - 1.0 K/uL   Eosinophils Relative 4 %   Eosinophils Absolute 0.2 0.0 - 0.5 K/uL   Basophils Relative 1 %   Basophils Absolute 0.0 0.0 - 0.1 K/uL   Immature Granulocytes 0 %   Abs Immature Granulocytes 0.01 0.00 - 0.07 K/uL    Comment: Performed at [facility], [address], [place], [place] [address]  Comprehensive metabolic panel     Status: Abnormal   Collection Time: 09/20/22  8:22 AM  Result Value Ref Range   Sodium 141 135 - 145 mmol/L   Potassium 4.4 3.5 - 5.1 mmol/L   Chloride 108 98 - 111 mmol/L   CO2 22 22 - 32 mmol/L   Glucose, Bld 103 (H) 70 - 99 mg/dL    Comment: Glucose reference range applies only to samples taken after fasting for at least 8 hours.   BUN 23 8 - 23 mg/dL   Creatinine, Ser 0.86 (H) 0.44 - 1.00 mg/dL   Calcium 8.9 8.9 - 57.8 mg/dL   Total Protein 5.5 (L) 6.5 - 8.1 g/dL   Albumin 3.0 (L) 3.5 - 5.0 g/dL   AST 18 15 - 41 U/L   ALT 13 0 - 44 U/L   Alkaline Phosphatase 40 38 - 126 U/L   Total Bilirubin 0.4 0.3 - 1.2 mg/dL   GFR, Estimated 32 (L) >60 mL/min    Comment: (NOTE) Calculated using the CKD-EPI Creatinine Equation (2021)    Anion gap 11 5 - 15    Comment: Performed at [facility], [address],  [place], [place] [address]  Magnesium     Status: None  Collection Time: 09/20/22  8:22 AM  Result Value Ref Range   Magnesium 1.9 1.7 - 2.4 mg/dL    Comment: Performed at [facility], [address], [place], [place] [address]  Urinalysis, w/ Reflex to Culture (Infection Suspected) -Urine, Clean Catch     Status: Abnormal   Collection Time: 09/20/22  8:22 AM  Result Value Ref Range   Specimen Source URINE, CLEAN CATCH    Color, Urine YELLOW YELLOW   APPearance HAZY (A) CLEAR   Specific Gravity, Urine 1.019 1.005 - 1.030   pH 5.0 5.0 - 8.0   Glucose, UA NEGATIVE NEGATIVE mg/dL   Hgb urine dipstick MODERATE (A) NEGATIVE   Bilirubin Urine NEGATIVE NEGATIVE   Ketones, ur NEGATIVE NEGATIVE mg/dL   Protein, ur NEGATIVE NEGATIVE mg/dL   Nitrite NEGATIVE NEGATIVE   Leukocytes,Ua TRACE (A) NEGATIVE   RBC / HPF 11-20 0 - 5 RBC/hpf   WBC, UA 6-10 0 - 5 WBC/hpf    Comment:        Reflex urine culture not performed if WBC <=10, OR if Squamous epithelial cells >5. If Squamous epithelial cells >5 suggest recollection.    Bacteria, UA MANY (A) NONE SEEN   Squamous Epithelial / HPF 6-10 0 - 5 /HPF   Mucus PRESENT     Comment: Performed at [facility], [address] [place], [place] [address]  Protime-INR     Status: None   Collection Time: 09/20/22  8:22 AM  Result Value Ref Range   Prothrombin Time 15.0 11.4 - 15.2 seconds   INR 1.2 0.8 - 1.2    Comment: (NOTE) INR goal varies based on device and disease states. Performed at [facility], [address], [place], [place] [address]   APTT     Status: None   Collection Time: 09/20/22  8:22 AM  Result Value Ref Range   aPTT 31 24 - 36 seconds    Comment: Performed at [facility], [address] [place], [place] [address]  I-stat chem 8, ED (not at [facility], [facility] or [facility])     Status: Abnormal   Collection Time: 09/20/22  8:32 AM  Result Value Ref Range   Sodium 140 135 - 145 mmol/L   Potassium 4.5 3.5 - 5.1 mmol/L    Chloride 108 98 - 111 mmol/L   BUN 27 (H) 8 - 23 mg/dL   Creatinine, Ser 0.27 (H) 0.44 - 1.00 mg/dL   Glucose, Bld 99 70 - 99 mg/dL    Comment: Glucose reference range applies only to samples taken after fasting for at least 8 hours.   Calcium, Ion 1.08 (L) 1.15 - 1.40 mmol/L   TCO2 24 22 - 32 mmol/L   Hemoglobin 8.8 (L) 12.0 - 15.0 g/dL   HCT 25.3 (L) 66.4 - 40.3 %  Type and screen [facility]     Status: None (Preliminary result)   Collection Time: 09/20/22 10:26 AM  Result Value Ref Range   ABO/RH(D) B POS    Antibody Screen NEG    Sample Expiration 09/23/2022,2359    Unit Number [id number]    Blood Component Type RED CELLS,LR    Unit division 00    Status of Unit ISSUED,FINAL    Transfusion Status OK TO TRANSFUSE    Crossmatch Result      Compatible Performed at [facility], [address] [place], [place] [address]    Unit Number [id number]  Blood Component Type RBC LR PHER2    Unit division 00    Status of Unit ISSUED    Transfusion Status OK TO TRANSFUSE    Crossmatch Result Compatible    Unit Number [id number]    Blood Component Type RBC LR PHER2    Unit division 00    Status of Unit ALLOCATED    Transfusion Status OK TO TRANSFUSE    Crossmatch Result Compatible   Prepare RBC (crossmatch)     Status: None   Collection Time: 09/20/22 11:53 AM  Result Value Ref Range   Order Confirmation      ORDER PROCESSED BY BLOOD BANK Performed at [facility], [address] [place], [place] [address]   Prepare RBC     Status: None   Collection Time: 09/20/22 12:12 PM  Result Value Ref Range   Order Confirmation      ORDER PROCESSED BY BLOOD BANK Performed at [facility], [address] [place], [place] [address]   CBC with Differential/Platelet     Status: Abnormal   Collection Time: 09/20/22  1:05 PM  Result Value Ref Range   WBC 6.1 4.0 - 10.5 K/uL   RBC 2.94 (L) 3.87 - 5.11 MIL/uL   Hemoglobin 8.8 (L) 12.0 -  15.0 g/dL   HCT 30.1 (L) 60.1 - 09.3 %   MCV 94.6 80.0 - 100.0 fL   MCH 29.9 26.0 - 34.0 pg   MCHC 31.7 30.0 - 36.0 g/dL   RDW 23.5 (H) 57.3 - 22.0 %   Platelets 172 150 - 400 K/uL   nRBC 0.0 0.0 - 0.2 %   Neutrophils Relative % 73 %   Neutro Abs 4.5 1.7 - 7.7 K/uL   Lymphocytes Relative 20 %   Lymphs Abs 1.2 0.7 - 4.0 K/uL   Monocytes Relative 6 %   Monocytes Absolute 0.4 0.1 - 1.0 K/uL   Eosinophils Relative 1 %   Eosinophils Absolute 0.1 0.0 - 0.5 K/uL   Basophils Relative 0 %   Basophils Absolute 0.0 0.0 - 0.1 K/uL   Immature Granulocytes 0 %   Abs Immature Granulocytes 0.02 0.00 - 0.07 K/uL    Comment: Performed at [facility], [address] [place], [place] [address]  Troponin I (High Sensitivity)     Status: Abnormal   Collection Time: 09/20/22  1:42 PM  Result Value Ref Range   Troponin I (High Sensitivity) 22 (H) <18 ng/L    Comment: (NOTE) Elevated high sensitivity troponin I (hsTnI) values and significant  changes across serial measurements may suggest ACS but many other  chronic and acute conditions are known to elevate hsTnI results.  Refer to the "Links" section for chest pain algorithms and additional  guidance. Performed at [facility], [address], [place], [place] [address]   Troponin I (High Sensitivity)     Status: Abnormal   Collection Time: 09/20/22  4:10 PM  Result Value Ref Range   Troponin I (High Sensitivity) 32 (H) <18 ng/L    Comment: (NOTE) Elevated high sensitivity troponin I (hsTnI) values and significant  changes across serial measurements may suggest ACS but many other  chronic and acute conditions are known to elevate hsTnI results.  Refer to the "Links" section for chest pain algorithms and additional  guidance. Performed at [facility], [address], [place], [place] [address]   Hemoglobin and hematocrit, blood     Status: Abnormal  Collection Time: 09/20/22  4:10 PM  Result Value Ref Range   Hemoglobin  9.5 (L) 12.0 - 15.0 g/dL   HCT 69.6 (L) 29.5 - 28.4 %    Comment: Performed at [facility], [address], [place], [place] [address]  Troponin I (High Sensitivity)     Status: Abnormal   Collection Time: 09/20/22  7:03 PM  Result Value Ref Range   Troponin I (High Sensitivity) 29 (H) <18 ng/L    Comment: (NOTE) Elevated high sensitivity troponin I (hsTnI) values and significant  changes across serial measurements may suggest ACS but many other  chronic and acute conditions are known to elevate hsTnI results.  Refer to the "Links" section for chest pain algorithms and additional  guidance. Performed at [facility], [address], [place], [place] [address]   Troponin I (High Sensitivity)     Status: Abnormal   Collection Time: 09/20/22  9:52 PM  Result Value Ref Range   Troponin I (High Sensitivity) 27 (H) <18 ng/L    Comment: (NOTE) Elevated high sensitivity troponin I (hsTnI) values and significant  changes across serial measurements may suggest ACS but many other  chronic and acute conditions are known to elevate hsTnI results.  Refer to the "Links" section for chest pain algorithms and additional  guidance. Performed at [facility], [address] [place], [place] [address]   Basic metabolic panel     Status: Abnormal   Collection Time: 09/21/22  3:33 AM  Result Value Ref Range   Sodium 139 135 - 145 mmol/L   Potassium 4.3 3.5 - 5.1 mmol/L   Chloride 106 98 - 111 mmol/L   CO2 24 22 - 32 mmol/L   Glucose, Bld 95 70 - 99 mg/dL    Comment: Glucose reference range applies only to samples taken after fasting for at least 8 hours.   BUN 25 (H) 8 - 23 mg/dL   Creatinine, Ser 4.40 (H) 0.44 - 1.00 mg/dL   Calcium 8.2 (L) 8.9 - 10.3 mg/dL   GFR, Estimated 32 (L) >60 mL/min    Comment: (NOTE) Calculated using the CKD-EPI Creatinine Equation (2021)    Anion gap 9 5 - 15    Comment: Performed at [facility], [address] [place], [place]  [address]  CBC with Differential/Platelet     Status: Abnormal   Collection Time: 09/21/22  3:33 AM  Result Value Ref Range   WBC 5.4 4.0 - 10.5 K/uL   RBC 2.47 (L) 3.87 - 5.11 MIL/uL   Hemoglobin 7.7 (L) 12.0 - 15.0 g/dL   HCT 59.5 (L) 63.8 - 75.6 %   MCV 93.5 80.0 - 100.0 fL   MCH 31.2 26.0 - 34.0 pg   MCHC 33.3 30.0 - 36.0 g/dL   RDW 43.3 (H) 29.5 - 18.8 %   Platelets 144 (L) 150 - 400 K/uL   nRBC 0.0 0.0 - 0.2 %   Neutrophils Relative % 68 %   Neutro Abs 3.6 1.7 - 7.7 K/uL   Lymphocytes Relative 22 %   Lymphs Abs 1.2 0.7 - 4.0 K/uL   Monocytes Relative 9 %   Monocytes Absolute 0.5 0.1 - 1.0 K/uL   Eosinophils Relative 1 %   Eosinophils Absolute 0.1 0.0 - 0.5 K/uL   Basophils Relative 0 %   Basophils Absolute 0.0 0.0 - 0.1 K/uL   Immature Granulocytes 0 %   Abs Immature Granulocytes 0.01 0.00 - 0.07  K/uL    Comment: Performed at [facility], [address], [place], [place] [address]  Prepare RBC (crossmatch)     Status: None   Collection Time: 09/21/22  7:42 AM  Result Value Ref Range   Order Confirmation      ORDER PROCESSED BY BLOOD BANK BB SAMPLE OR UNITS ALREADY AVAILABLE Performed at [facility], [address], [place], [place] [address]   Hemoglobin and hematocrit, blood     Status: Abnormal   Collection Time: 09/21/22 12:30 PM  Result Value Ref Range   Hemoglobin 8.8 (L) 12.0 - 15.0 g/dL   HCT 09.8 (L) 11.9 - 14.7 %    Comment: Performed at [facility], [address], [place], [place] [address]    IR US Guide Vasc Access Left  Result Date: 09/20/2022 INDICATION: GI bleed.  Hypotension.  Resuscitation. EXAM: NON-TUNNELED CENTRAL VENOUS CATHETER PLACEMENT WITH ULTRASOUND AND FLUOROSCOPIC GUIDANCE COMPARISON:  CT AP, earlier same day. MEDICATIONS: 5 mL lidocaine 1% FLUOROSCOPY TIME:  Fluoroscopic dose; 0 mGy COMPLICATIONS: None immediate. PROCEDURE: Informed written consent was obtained from the patient and/or patient's representative after a  discussion of the risks, benefits, and alternatives to treatment. Questions regarding the procedure were encouraged and answered. The LEFT groin was prepped with chlorhexidine in a sterile fashion, and a sterile drape was applied covering the operative field. Maximum barrier sterile technique with sterile gowns and gloves were used for the procedure. A timeout was performed prior to the initiation of the procedure. After the overlying soft tissues were anesthetized, a small venotomy incision was created and a micropuncture kit was utilized to access the LEFT greater saphenous vein. Real-time ultrasound guidance was utilized for vascular access including the acquisition of a permanent ultrasound image documenting patency of the accessed vessel. The microwire was utilized to measure appropriate catheter length. A stiff glidewire was advanced to the level of the IVC. Under fluoroscopic guidance, the venotomy was serially dilated, ultimately allowing placement of a 16 cm triple-lumen non tunneled CVC with tip ultimately terminating within the LEFT pelvic veins. Final catheter positioning was confirmed and documented with a spot radiographic image. The catheter aspirates and flushes normally. The catheter was flushed heparinized saline. The catheter exit site was secured with a 2-0-Silk retention suture. A dressing was placed. The patient tolerated the procedure well without immediate post procedural complication. IMPRESSION: Successful placement of a LEFT femoral vein approach non-tunneled central venous catheter for resuscitation. The catheter is ready for immediate use. Roanna Banning, MD [facility] Radiology Electronically Signed   By: Roanna Banning M.D.   On: 09/20/2022 14:33   IR Angiogram Visceral Selective  Result Date: 09/20/2022 INDICATION: 89 year old woman with acute lower GI bleed presents to interventional radiology for angiogram and possible embolization. CT  angiography performed on 09/20/2022 shows active extravasation in the distal ascending colon, likely in the right colic territory. EXAM: 1. Ultrasound-guided access of right common femoral artery 2. Superior mesenteric angiogram 3. Right colic angiogram and branch embolization MEDICATIONS: None ANESTHESIA/SEDATION: None CONTRAST:  44 mL of Omnipaque 300 FLUOROSCOPY: Radiation Exposure Index (as provided by the fluoroscopic device): 170 mGy Kerma COMPLICATIONS: None immediate. PROCEDURE: Informed consent was obtained from the patient following explanation of the procedure, risks, benefits and alternatives. The patient understands, agrees and consents for the procedure. All questions were addressed. A time out was performed prior to the initiation of the procedure. Maximal barrier sterile technique utilized including caps, mask,  sterile gowns, sterile gloves, large sterile drape, hand hygiene, and chlorhexidine prep. Ultrasound image documenting patency of the right common femoral artery was obtained and placed in permanent medical record. Sterile ultrasound probe cover and gel utilized throughout the procedure. Utilizing continuous ultrasound guidance, the right common femoral artery was accessed at the level of the femoral head with a 21 gauge needle. 21 gauge needle exchanged for a transitional dilator set over 0.018 inch guidewire. Transitional dilator set exchanged for 5 French sheath over 0.035 inch guidewire. The superior mesenteric artery was selected with VS1 catheter. Superior mesenteric angiogram showed patent small and large bowel branches. Active extravasation was seen from the right colic branch as expected based on the CT. The right colic branch was selected with Progreat microcatheter. Access was advanced as distally as possible into the branch responsible for the hemorrhage and embolization was performed with one 3 mm x 5 cm detachable Ruby coil. At this point the base catheter access was lost,  therefore microcatheter was removed. The superior mesenteric artery with again engaged with the VS 1 catheter. Lantern microcatheter was advanced back into the right colic artery. Repeat angiogram showed no additional hemorrhage. Lantern microcatheter was removed and superior mesenteric angiogram was repeated which showed no additional site of hemorrhage. The patient became more hypotensive at this point. Rapid response was consulted. Patient was given bolus of fluid as well as 1 unit of packed red blood cells. Her blood pressure improved. Given her suboptimal IV access, a left groin central venous catheter was placed. The patient's condition continued to improve and she did not need further intervention. Right groin access was removed and hemostasis achieved with 20 minutes of manual compression. IMPRESSION: Successful embolization of right colic branch for treatment of acute lower GI bleed. Electronically Signed   By: Acquanetta Belling M.D.   On: 09/20/2022 14:17   IR US Guide Vasc Access Right  Result Date: 09/20/2022 INDICATION: 88 year old woman with acute lower GI bleed presents to interventional radiology for angiogram and possible embolization. CT angiography performed on 09/20/2022 shows active extravasation in the distal ascending colon, likely in the right colic territory. EXAM: 1. Ultrasound-guided access of right common femoral artery 2. Superior mesenteric angiogram 3. Right colic angiogram and branch embolization MEDICATIONS: None ANESTHESIA/SEDATION: None CONTRAST:  44 mL of Omnipaque 300 FLUOROSCOPY: Radiation Exposure Index (as provided by the fluoroscopic device): 170 mGy Kerma COMPLICATIONS: None immediate. PROCEDURE: Informed consent was obtained from the patient following explanation of the procedure, risks, benefits and alternatives. The patient understands, agrees and consents for the procedure. All questions were addressed. A time out was performed prior to the initiation of the procedure.  Maximal barrier sterile technique utilized including caps, mask, sterile gowns, sterile gloves, large sterile drape, hand hygiene, and chlorhexidine prep. Ultrasound image documenting patency of the right common femoral artery was obtained and placed in permanent medical record. Sterile ultrasound probe cover and gel utilized throughout the procedure. Utilizing continuous ultrasound guidance, the right common femoral artery was accessed at the level of the femoral head with a 21 gauge needle. 21 gauge needle exchanged for a transitional dilator set over 0.018 inch guidewire. Transitional dilator set exchanged for 5 French sheath over 0.035 inch guidewire. The superior mesenteric artery was selected with VS1 catheter. Superior mesenteric angiogram showed patent small and large bowel branches. Active extravasation was seen from the right colic branch as expected based on the CT. The right colic branch was selected with Progreat microcatheter. Access was advanced as distally  as possible into the branch responsible for the hemorrhage and embolization was performed with one 3 mm x 5 cm detachable Ruby coil. At this point the base catheter access was lost, therefore microcatheter was removed. The superior mesenteric artery with again engaged with the VS 1 catheter. Lantern microcatheter was advanced back into the right colic artery. Repeat angiogram showed no additional hemorrhage. Lantern microcatheter was removed and superior mesenteric angiogram was repeated which showed no additional site of hemorrhage. The patient became more hypotensive at this point. Rapid response was consulted. Patient was given bolus of fluid as well as 1 unit of packed red blood cells. Her blood pressure improved. Given her suboptimal IV access, a left groin central venous catheter was placed. The patient's condition continued to improve and she did not need further intervention. Right groin access was removed and hemostasis achieved with 20  minutes of manual compression. IMPRESSION: Successful embolization of right colic branch for treatment of acute lower GI bleed. Electronically Signed   By: Acquanetta Belling M.D.   On: 09/20/2022 14:17   IR EMBO ART  VEN HEMORR LYMPH EXTRAV  INC GUIDE ROADMAPPING  Result Date: 09/20/2022 INDICATION: 89 year old woman with acute lower GI bleed presents to interventional radiology for angiogram and possible embolization. CT angiography performed on 09/20/2022 shows active extravasation in the distal ascending colon, likely in the right colic territory. EXAM: 1. Ultrasound-guided access of right common femoral artery 2. Superior mesenteric angiogram 3. Right colic angiogram and branch embolization MEDICATIONS: None ANESTHESIA/SEDATION: None CONTRAST:  44 mL of Omnipaque 300 FLUOROSCOPY: Radiation Exposure Index (as provided by the fluoroscopic device): 170 mGy Kerma COMPLICATIONS: None immediate. PROCEDURE: Informed consent was obtained from the patient following explanation of the procedure, risks, benefits and alternatives. The patient understands, agrees and consents for the procedure. All questions were addressed. A time out was performed prior to the initiation of the procedure. Maximal barrier sterile technique utilized including caps, mask, sterile gowns, sterile gloves, large sterile drape, hand hygiene, and chlorhexidine prep. Ultrasound image documenting patency of the right common femoral artery was obtained and placed in permanent medical record. Sterile ultrasound probe cover and gel utilized throughout the procedure. Utilizing continuous ultrasound guidance, the right common femoral artery was accessed at the level of the femoral head with a 21 gauge needle. 21 gauge needle exchanged for a transitional dilator set over 0.018 inch guidewire. Transitional dilator set exchanged for 5 French sheath over 0.035 inch guidewire. The superior mesenteric artery was selected with VS1 catheter. Superior mesenteric  angiogram showed patent small and large bowel branches. Active extravasation was seen from the right colic branch as expected based on the CT. The right colic branch was selected with Progreat microcatheter. Access was advanced as distally as possible into the branch responsible for the hemorrhage and embolization was performed with one 3 mm x 5 cm detachable Ruby coil. At this point the base catheter access was lost, therefore microcatheter was removed. The superior mesenteric artery with again engaged with the VS 1 catheter. Lantern microcatheter was advanced back into the right colic artery. Repeat angiogram showed no additional hemorrhage. Lantern microcatheter was removed and superior mesenteric angiogram was repeated which showed no additional site of hemorrhage. The patient became more hypotensive at this point. Rapid response was consulted. Patient was given bolus of fluid as well as 1 unit of packed red blood cells. Her blood pressure improved. Given her suboptimal IV access, a left groin central venous catheter was placed. The patient's condition continued  to improve and she did not need further intervention. Right groin access was removed and hemostasis achieved with 20 minutes of manual compression. IMPRESSION: Successful embolization of right colic branch for treatment of acute lower GI bleed. Electronically Signed   By: Acquanetta Belling M.D.   On: 09/20/2022 14:17   IR Fluoro Guide CV Line Left  Result Date: 09/20/2022 INDICATION: GI bleed.  Hypotension.  Resuscitation. EXAM: NON-TUNNELED CENTRAL VENOUS CATHETER PLACEMENT WITH ULTRASOUND AND FLUOROSCOPIC GUIDANCE COMPARISON:  CT AP, earlier same day. MEDICATIONS: 5 mL lidocaine 1% FLUOROSCOPY TIME:  Fluoroscopic dose; 0 mGy COMPLICATIONS: None immediate. PROCEDURE: Informed written consent was obtained from the patient and/or patient's representative after a discussion of the risks, benefits, and alternatives to treatment. Questions regarding the  procedure were encouraged and answered. The LEFT groin was prepped with chlorhexidine in a sterile fashion, and a sterile drape was applied covering the operative field. Maximum barrier sterile technique with sterile gowns and gloves were used for the procedure. A timeout was performed prior to the initiation of the procedure. After the overlying soft tissues were anesthetized, a small venotomy incision was created and a micropuncture kit was utilized to access the LEFT greater saphenous vein. Real-time ultrasound guidance was utilized for vascular access including the acquisition of a permanent ultrasound image documenting patency of the accessed vessel. The microwire was utilized to measure appropriate catheter length. A stiff glidewire was advanced to the level of the IVC. Under fluoroscopic guidance, the venotomy was serially dilated, ultimately allowing placement of a 16 cm triple-lumen non tunneled CVC with tip ultimately terminating within the LEFT pelvic veins. Final catheter positioning was confirmed and documented with a spot radiographic image. The catheter aspirates and flushes normally. The catheter was flushed heparinized saline. The catheter exit site was secured with a 2-0-Silk retention suture. A dressing was placed. The patient tolerated the procedure well without immediate post procedural complication. IMPRESSION: Successful placement of a LEFT femoral vein approach non-tunneled central venous catheter for resuscitation. The catheter is ready for immediate use. Roanna Banning, MD [facility] Radiology Electronically Signed   By: Roanna Banning M.D.   On: 09/20/2022 13:13   CT ANGIO GI BLEED  Result Date: 09/20/2022 CLINICAL DATA:  Lower GI bleed. EXAM: CTA ABDOMEN AND PELVIS WITHOUT AND WITH CONTRAST TECHNIQUE: Multidetector CT imaging of the abdomen and pelvis was performed using the standard protocol during bolus administration of intravenous  contrast. Multiplanar reconstructed images and MIPs were obtained and reviewed to evaluate the vascular anatomy. RADIATION DOSE REDUCTION: This exam was performed according to the departmental dose-optimization program which includes automated exposure control, adjustment of the mA and/or kV according to patient size and/or use of iterative reconstruction technique. CONTRAST:  70mL OMNIPAQUE IOHEXOL 350 MG/ML SOLN COMPARISON:  CT abdomen/pelvis 01/22/2022. FINDINGS: VASCULAR Aorta: Normal caliber aorta without aneurysm, dissection, vasculitis or significant stenosis. Extensive atherosclerotic calcifications. Celiac: Patent without evidence of aneurysm, dissection, vasculitis or significant stenosis. SMA: Patent without evidence of aneurysm, dissection, vasculitis or significant stenosis. Renals: Calcified plaque results in moderate stenosis of the left renal artery and mild stenosis of the right renal artery. IMA: Patent without evidence of aneurysm, dissection, vasculitis or significant stenosis. Inflow: Patent without evidence of aneurysm, dissection, vasculitis or significant stenosis. Proximal Outflow: Bilateral common femoral and visualized portions of the superficial and profunda femoral arteries are patent without evidence of aneurysm, dissection, vasculitis or significant stenosis. Veins: Normal. Review of the MIP images confirms the above findings. NON-VASCULAR Lower  chest: No acute abnormality. Extensive coronary artery calcifications. Hepatobiliary: No focal liver abnormality is seen. Status post cholecystectomy. No biliary dilatation. Pancreas: Unremarkable. No pancreatic ductal dilatation or surrounding inflammatory changes. Spleen: Normal in size without focal abnormality. Adrenals/Urinary Tract: Adrenal glands are unremarkable. Kidneys are unremarkable without radiopaque stone or hydronephrosis. Bladder is decompressed. Stomach/Bowel: Unchanged moderate hiatal hernia. No dilated loops of small bowel.  Appendix is not visualized. Severe pancolonic diverticulosis. Active arterial extravasation of contrast within the hepatic flexure (axial image 65 series 10). No bowel wall thickening or surrounding inflammation to suggest acute diverticulitis. Lymphatic: No abdominal or pelvic lymphadenopathy. Reproductive: Status post hysterectomy. No adnexal masses. Other: No abdominal wall hernia or abnormality. No abdominopelvic ascites. Musculoskeletal: Left total hip arthroplasty. IMPRESSION: 1. Active arterial extravasation of contrast within the hepatic flexure of the colon. 2. Severe pancolonic diverticulosis without evidence of acute diverticulitis. 3. Unchanged moderate hiatal hernia. 4. Coronary artery disease. Aortic Atherosclerosis (ICD10-I70.0). Electronically Signed   By: Orvan Falconer M.D.   On: 09/20/2022 09:37    ROS negative except above Blood pressure (!) 141/94, pulse 66, temperature 98.4 F (36.9 C), temperature source Oral, resp. rate 15, SpO2 99%. Physical Exam vital signs stable afebrile no acute distress abdomen is soft essentially nontender no guarding or rebound hemoglobin dropped to 7.7 now 8.8 unsure if she got 1 more unit of blood BUN 25 essentially unchanged creatinine 1.5 stable she was not iron deficient CT and angiogram reviewed Assessment/Plan: Diverticular bleeding status post IR coils in hepatic flexure Plan: If no signs of bleeding tomorrow can slowly advance diet and please call sooner if GI question or problem otherwise we will check on tomorrow and avoid aspirin and nonsteroidals as an outpatient and Tylenol only  , E 09/21/2022, 3:18 PM

## 2022-09-21 NOTE — Progress Notes (Addendum)
Daily Progress Note Intern Pager: [phone]  Patient name: [name] Medical record number: [id number] Date of birth: [birth year] Age: 89 y.o. Gender: female  Primary Care Provider: Erick Alley, DO Consultants: GI  Code Status: DNR   Pt Overview and Major Events to Date:  8/8 Admission s/p VIR embolization   Assessment and Plan: [name] is a 89 y.o. female w/ history of diverticular bleed and mesenteric embolization presenting with rectal bleeding secondary to diverticulosis. Now s/p SMA embolization of the right colic branch with IR. Received 1 unit of pRBCs yesterday. CBC at 7.8 this morning and received 1 more unit this morning. Continuing to hold majority hypertensive meds due to hypotension and bradycardia. Will decrease home metoprolol dose to 12.5 for additional cardioprotective effects, and minimal effect on HR. Continue to watch for blood in stool. PT/OT eval pending.   -      Hospital     Diverticular bleed     S/p mesenteric embolization by IR 8/8, only received 1u pRBCs during  VIR procedure  - Admit to FMTS, progressive, Attending Dr. Jennette Kettle,  - s/p 1unit 8/8, Hgb improved to 9.5 - AM CBC 7.8 , given 1unit of RBC, PM H/H 8.8/26.8 - Continue po iron  - vitals per unit routine  - Hold antihypertensives as patient was bradycardic with hypotension  earlier, add back as appropriate  - Metoprolol dose decreased to 12.5 mg daily, restarted d/t  cardioprotective effects   - GI consulted appreciate recommendations  - Avoid NSAIDs   - transfusion threshold < 8         Chronic heart failure (HCC)     HFpEF, last ECHO 3 years ago EF 60-65%, G1DD, moderate aortic stenosis  Patient hypotensive and bradycardia secondary to GI bleed. Denies and BMS  since procedure.  - Hold home imdur and lasix.  - Monitor vitals         Gastrointestinal hemorrhage    FEN/GI: Clear liquid diet  PPx: None in the setting of GI bleed  Dispo:Home pending clinical improvement .  Barriers include hematochezia and vital stabilization .   Subjective:  Patient well this morning. Denies any pain. Hasn't had a bowel movement since procedure. Tolerating liquid diet right now. Denies nausea or vomiting. Denies chest pain, shortness of breath.   Objective: Temp:  [97.9 F (36.6 C)-98.4 F (36.9 C)] 98.4 F (36.9 C) (08/09 1129) Pulse Rate:  [56-66] 66 (08/09 1300) Resp:  [13-19] 15 (08/09 1300) BP: (90-152)/(40-82) 132/82 (08/09 1300) SpO2:  [96 %-100 %] 99 % (08/09 1300)  Physical Exam Constitutional:      Appearance: She is normal weight.  HENT:     Head: Normocephalic.  Eyes:     Conjunctiva/sclera: Conjunctivae normal.  Cardiovascular:     Rate and Rhythm: Bradycardia present.     Heart sounds: No murmur heard. Pulmonary:     Effort: No respiratory distress.     Breath sounds: No wheezing.  Abdominal:     General: There is no distension.     Palpations: Abdomen is soft.     Tenderness: There is no abdominal tenderness.  Musculoskeletal:        General: No swelling.     Right lower leg: No edema.     Left lower leg: No edema.  Neurological:     General: No focal deficit present.     Mental Status: She is alert and oriented to person, place, and time.  Psychiatric:  Mood and Affect: Mood normal.        Behavior: Behavior normal.     Laboratory: Most recent CBC  Most recent BMP    Latest Ref Rng & Units 09/21/2022    3:33 AM  BMP  Glucose 70 - 99 mg/dL 95   BUN 8 - 23 mg/dL 25   Creatinine 0.98 - 1.00 mg/dL 1.19   Sodium 147 - 829 mmol/L 139   Potassium 3.5 - 5.1 mmol/L 4.3   Chloride 98 - 111 mmol/L 106   CO2 22 - 32 mmol/L 24   Calcium 8.9 - 10.3 mg/dL 8.2       Latest Ref Rng & Units 09/21/2022   12:30 PM 09/21/2022    3:33 AM 09/20/2022    4:10 PM  CBC  WBC 4.0 - 10.5 K/uL  5.4    Hemoglobin 12.0 - 15.0 g/dL 8.8  7.7  9.5   Hematocrit 36.0 - 46.0 % 26.8  23.1  29.3   Platelets 150 - 400 K/uL  144      Other pertinent labs  Trops 22>> 29  Imaging/Diagnostic Tests:  CT: Angio  IMPRESSION: 1. Active arterial extravasation of contrast within the hepatic flexure of the colon. 2. Severe pancolonic diverticulosis without evidence of acute diverticulitis. 3. Unchanged moderate hiatal hernia. 4. Coronary artery disease . Peterson Ao, MD 09/21/2022, 2:43 PM  PGY-1, [facility] Family Medicine FPTS Intern pager: [phone], text pages welcome Secure chat group [facility] Teaching Service

## 2022-09-21 NOTE — Progress Notes (Signed)
Mobility Specialist Progress Note:   09/21/22 1436  Mobility  Activity Ambulated with assistance in hallway  Level of Assistance Contact guard assist, steadying assist  Assistive Device Front wheel walker  Distance Ambulated (ft) 95 ft  Activity Response Tolerated well  Mobility Referral Yes  $Mobility charge 1 Mobility  Mobility Specialist Start Time (ACUTE ONLY) 1415  Mobility Specialist Stop Time (ACUTE ONLY) 1430  Mobility Specialist Time Calculation (min) (ACUTE ONLY) 15 min   Pre Mobility: 69 HR  Post Mobility: 62 HR ,141/94 BP  Pt received in chair, agreeable to mobility. Pt requesting to use BR before ambulation. Void successful. Blood present in stool. RN notified. During ambulation pt c/o numbness in right hand, otherwise asymptomatic throughout. Pt returned to chair with call bell in hand and all needs met.     Leory Plowman  Mobility Specialist Please contact via [employer] office at [phone]

## 2022-09-21 NOTE — Assessment & Plan Note (Signed)
HFpEF, last ECHO 3 years ago EF 60-65%, G1DD, moderate aortic stenosis  Patient hypotensive and bradycardia secondary to GI bleed. Denies and BMS since procedure.  - Hold home imdur and lasix.  - Monitor vitals

## 2022-09-21 NOTE — Assessment & Plan Note (Signed)
S/p mesenteric embolization by IR 8/8, only received 1u pRBCs during VIR procedure  - Admit to FMTS, progressive, Attending Dr. Jennette Kettle,  - s/p 1unit 8/8, Hgb improved to 9.5 - AM CBC 7.8 , given 1unit of RBC, PM H/H 8.8/26.8 - Continue po iron  - vitals per unit routine  - Hold antihypertensives as patient was bradycardic with hypotension earlier, add back as appropriate  - Metoprolol dose decreased to 12.5 mg daily, restarted d/t cardioprotective effects   - GI consulted appreciate recommendations  - Avoid NSAIDs   - transfusion threshold < 8

## 2022-09-21 NOTE — Hospital Course (Addendum)
[name] is a 89 y.o.female with a history of diverticular bleed and mesenteric embolization who was admitted to the family medicine teaching Service at [facility] for diverticular bleed. Her hospital course is detailed below:  Diverticular Bleed Presented to the ED for multiple bouts of continuous rectal bleeding.  IR consulted and performed mesenteric embolization and received prophylactic PRBC transfusion given presentation.  She did ultimately received total***units of PRBC.   Other chronic conditions were medically managed with home medications and formulary alternatives as necessary (***)  PCP Follow-up Recommendations:

## 2022-09-22 ENCOUNTER — Encounter (HOSPITAL_COMMUNITY): Payer: Self-pay | Admitting: Family Medicine

## 2022-09-22 DIAGNOSIS — D62 Acute posthemorrhagic anemia: Secondary | ICD-10-CM | POA: Diagnosis not present

## 2022-09-22 MED ORDER — FAMOTIDINE 20 MG PO TABS
10.0000 mg | ORAL_TABLET | Freq: Once | ORAL | Status: AC
  Administered 2022-09-22: 10 mg via ORAL
  Filled 2022-09-22: qty 1

## 2022-09-22 NOTE — Progress Notes (Signed)
FMTS Brief Progress Note  S: To bedside with Dr. Velna Ochs for night rounds.  Patient reports she is having little bit of reflux in her throat since eating spaghetti for lunch.  Denies chest pain, denies abdominal pain denies shortness of breath or vomiting.  Denies additional concerns.  O: BP (!) 134/57 (BP Location: Left Arm)   Pulse 69   Temp 98.2 F (36.8 C) (Oral)   Resp 17   SpO2 100%    GEN: Well appearing, NAD CV: RRR, systolic murmur appreciated Respiratory: CTAB, normal WOB on RA Abdominal: Soft, NT/ND  A/P:  Diverticular bleed -Hemodynamically stable, hemoglobin 8.1 this morning, recheck in a.m. -Likely consult IR if bleeding and changes in hemodynamics  Acid reflux -Ordered famotidine x 1  Remainder of plan per today's progress note   - Orders reviewed. Labs for AM ordered, which was adjusted as needed.  - If condition changes, plan includes page primary team.   Vonna Drafts, MD 09/22/2022, 8:10 PM PGY-2, [facility] Family Medicine Night Resident  Please page [phone] with questions.

## 2022-09-22 NOTE — Assessment & Plan Note (Signed)
HFpEF, last ECHO 3 years ago EF 60-65%, G1DD, moderate aortic stenosis.  Remains bradycardic, hypotension improved.  Home medications include Imdur 120 mg daily, metoprolol 25 mg daily, Lasix 20 mg daily. - Hold home imdur and lasix. - Restarted metoprolol at 12.5 mg daily - Monitor vitals

## 2022-09-22 NOTE — Progress Notes (Signed)
     Daily Progress Note Intern Pager: [phone]  Patient name: [name] Medical record number: [id number] Date of birth: [birth year] Age: 89 y.o. Gender: female  Primary Care Provider: Erick Alley, DO Consultants: IR, GI Code Status: DNR  Pt Overview and Major Events to Date:  8/8: Admission status post VIR embolization, 1 unit PRBC 8/9: Received 1 unit PRBC  Assessment and Plan: [name] is a 89 y.o. female who presented with medical history of diverticulosis diverticular bleed and received mesenteric embolization of the right colic branch.  Hemorrhage has resolved although she continues to have several bloody bowel movements. Pertinent PMH/PSH includes diverticulosis, HTN, HFpEF, GERD, osteoarthritis.  [facility]     * (Principal) Diverticular bleed     S/p mesenteric embolization by IR 8/8, s/p 2u pRBC. Unfortunately,  bloody Bms continue (3 in the last 24 hours).  Femoral line still in  place.  Hemoglobin 8.8 >8.1 -GI following, appreciate recommendations -Call IR if bloody BM causes hemodynamic instability -Trend CBC daily - Continue po iron  - vitals per unit routine  - Metoprolol dose decreased to 12.5 mg daily, restarted d/t  cardioprotective effects    - Avoid NSAIDs   - transfusion threshold < 8         Chronic heart failure (HCC)     HFpEF, last ECHO 3 years ago EF 60-65%, G1DD, moderate aortic stenosis.   Remains bradycardic, hypotension improved.  Home medications include  Imdur 120 mg daily, metoprolol 25 mg daily, Lasix 20 mg daily. - Hold home imdur and lasix. - Restarted metoprolol at 12.5 mg daily - Monitor vitals         Acute blood loss anemia (ABLA)    FEN/GI: Dysphagia 3 diet PPx: SCDs Dispo:Home pending clinical improvement . Barriers include persistent bloody BMs.   Subjective:  Notes she is feeling well today, denies abdominal pain.  She is frustrated by the noise on the TV and wants to know how to turn it  off.  Objective: Temp:  [97.3 F (36.3 C)-98.4 F (36.9 C)] 97.9 F (36.6 C) (08/10 0816) Pulse Rate:  [53-71] 71 (08/10 0816) Resp:  [15-18] 18 (08/10 0816) BP: (98-141)/(40-94) 120/50 (08/10 0816) SpO2:  [99 %-100 %] 100 % (08/10 0816) Physical Exam: General: Well-appearing, NAD HEENT: Conjunctival pallor Cardiovascular: RRR, 4/6 systolic murmur Respiratory: CTAB, normal WOB Abdomen: Soft, nontender, normoactive bowel sounds  Laboratory: Most recent CBC Lab Results  Component Value Date   WBC 5.5 09/22/2022   HGB 8.1 (L) 09/22/2022   HCT 24.8 (L) 09/22/2022   MCV 93.2 09/22/2022   PLT 144 (L) 09/22/2022   Most recent BMP    Latest Ref Rng & Units 09/22/2022    4:22 AM  BMP  Glucose 70 - 99 mg/dL 518   BUN 8 - 23 mg/dL 21   Creatinine 8.41 - 1.00 mg/dL 6.60   Sodium 630 - 160 mmol/L 137   Potassium 3.5 - 5.1 mmol/L 4.3   Chloride 98 - 111 mmol/L 107   CO2 22 - 32 mmol/L 23   Calcium 8.9 - 10.3 mg/dL 8.3    Imaging/Diagnostic Tests: No recent imaging studies [name], DO 09/22/2022, 10:28 AM  PGY-3,  Family Medicine FPTS Intern pager: [phone], text pages welcome Secure chat group [facility] Teaching Service

## 2022-09-22 NOTE — Progress Notes (Signed)
[name] 9:24 AM  Subjective: Patient tolerating diet fine without any complaints had 2 small bowel movements this morning both were dark no signs of bleeding and no new complaints  Objective: Vital signs stable afebrile no acute distress abdomen is soft nontender BUN slight decreased creatinine slight decrease hemoglobin jumping around from 7.7-8.8-8.1 she denies any transfusion that she knows of  Assessment: Seemingly resolved diverticular bleeding  Plan: We discussed no aspirin or nonsteroidals at home and only Tylenol and please call me if I could be of any further assistance this weekend from a GI standpoint  [facility] E  office [phone] After 5PM or if no answer call [phone]

## 2022-09-22 NOTE — Assessment & Plan Note (Signed)
S/p mesenteric embolization by IR 8/8, s/p 2u pRBC. No bloody BM overnight. Femoral line still in place.  Hemoglobin 8.8 >8.1> -Appreciate GI -Call IR if bloody BM causes hemodynamic instability -Trend CBC daily - Continue po iron  - vitals per unit routine  - Metoprolol dose decreased to 12.5 mg daily, restarted d/t cardioprotective effects    - Avoid NSAIDs and aspirin - transfusion threshold < 8

## 2022-09-22 NOTE — Assessment & Plan Note (Addendum)
S/p mesenteric embolization by IR 8/8, s/p 2u pRBC. Unfortunately, bloody Bms continue (3 in the last 24 hours).  Femoral line still in place.  Hemoglobin 8.8 >8.1 -GI following, appreciate recommendations -Call IR if bloody BM causes hemodynamic instability -Trend CBC daily - Continue po iron  - vitals per unit routine  - Metoprolol dose decreased to 12.5 mg daily, restarted d/t cardioprotective effects    - Avoid NSAIDs   - transfusion threshold < 8

## 2022-09-23 DIAGNOSIS — D62 Acute posthemorrhagic anemia: Secondary | ICD-10-CM | POA: Diagnosis not present

## 2022-09-23 LAB — CBC
HCT: 22.8 % — ABNORMAL LOW (ref 36.0–46.0)
Hemoglobin: 7.3 g/dL — ABNORMAL LOW (ref 12.0–15.0)
MCH: 29.9 pg (ref 26.0–34.0)
MCHC: 32 g/dL (ref 30.0–36.0)
MCV: 93.4 fL (ref 80.0–100.0)
Platelets: 156 10*3/uL (ref 150–400)
RBC: 2.44 MIL/uL — ABNORMAL LOW (ref 3.87–5.11)
RDW: 16.1 % — ABNORMAL HIGH (ref 11.5–15.5)
WBC: 5.5 10*3/uL (ref 4.0–10.5)
nRBC: 0 % (ref 0.0–0.2)

## 2022-09-23 LAB — HEMOGLOBIN AND HEMATOCRIT, BLOOD
HCT: 26.8 % — ABNORMAL LOW (ref 36.0–46.0)
Hemoglobin: 8.9 g/dL — ABNORMAL LOW (ref 12.0–15.0)

## 2022-09-23 LAB — PREPARE RBC (CROSSMATCH)

## 2022-09-23 MED ORDER — SODIUM CHLORIDE 0.9% IV SOLUTION: Freq: Once | INTRAVENOUS | Status: AC

## 2022-09-23 NOTE — Evaluation (Signed)
Occupational Therapy Evaluation Patient Details Name: [name] MRN: [id number] DOB: [birth year] Today's Date: 09/23/2022   History of Present Illness Pt is a 89 y/o F presenting to ED on 8/8 with blood in stool, admitted for diverticular bleed. PMH Includes diverticulosis, HTN, HFpEF, GERD, OA, CKD III, mild aortic stenosis.   Clinical Impression   Pt reports ind at baseline with ADLs and uses SPC for mobility, drives, lives alone. Pt currently needing supervision - min A for ADLs, supervision for bed mobility and transfers with RW. Pt able to complete grooming/bathing tasks at sink with x1 seated rest break. Pt presenting with impairments listed below, will follow acutely. Recommend HHOT at d/c pending progression.       If plan is discharge home, recommend the following: A little help with walking and/or transfers;A little help with bathing/dressing/bathroom;Assistance with cooking/housework;Help with stairs or ramp for entrance    Functional Status Assessment  Patient has had a recent decline in their functional status and demonstrates the ability to make significant improvements in function in a reasonable and predictable amount of time.  Equipment Recommendations  Other (comment) (RW)    Recommendations for Other Services PT consult     Precautions / Restrictions Precautions Precautions: Fall Precaution Comments: L fem access Restrictions Weight Bearing Restrictions: No      Mobility Bed Mobility Overal bed mobility: Needs Assistance Bed Mobility: Supine to Sit     Supine to sit: Supervision          Transfers Overall transfer level: Needs assistance Equipment used: Rolling walker (2 wheels) Transfers: Sit to/from Stand Sit to Stand: Supervision                  Balance Overall balance assessment: Needs assistance Sitting-balance support: Feet supported Sitting balance-Leahy Scale: Good     Standing balance support: During functional activity,  Reliant on assistive device for balance Standing balance-Leahy Scale: Fair Standing balance comment: benefits from RW support                           ADL either performed or assessed with clinical judgement   ADL Overall ADL's : Needs assistance/impaired Eating/Feeding: Supervision/ safety   Grooming: Supervision/safety;Oral care;Wash/dry face   Upper Body Bathing: Standing;Contact guard assist   Lower Body Bathing: Minimal assistance   Upper Body Dressing : Contact guard assist;Sitting;Standing   Lower Body Dressing: Minimal assistance;Sitting/lateral leans   Toilet Transfer: Supervision/safety;Ambulation;Rolling walker (2 wheels)   Toileting- Clothing Manipulation and Hygiene: Supervision/safety       Functional mobility during ADLs: Supervision/safety;Rolling walker (2 wheels)       Vision   Vision Assessment?: No apparent visual deficits     Perception Perception: Not tested       Praxis Praxis: Not tested       Pertinent Vitals/Pain Pain Assessment Pain Assessment: No/denies pain     Extremity/Trunk Assessment Upper Extremity Assessment Upper Extremity Assessment: Generalized weakness   Lower Extremity Assessment Lower Extremity Assessment: Defer to PT evaluation   Cervical / Trunk Assessment Cervical / Trunk Assessment: Normal   Communication Communication Communication: Hearing impairment   Cognition Arousal: Alert Behavior During Therapy: WFL for tasks assessed/performed Overall Cognitive Status: Within Functional Limits for tasks assessed                                 General Comments: needs questions/cues repeated likely  due to [facility] vs cog     General Comments  VSS on RA    Exercises     Shoulder Instructions      Home Living Family/patient expects to be discharged to:: Private residence Living Arrangements: Alone Available Help at Discharge: Family;Friend(s);Available PRN/intermittently Type of Home:  House Home Access: Stairs to enter [employer] of Steps: 2 front/3 back Entrance Stairs-Rails: Can reach both Home Layout: One level     Bathroom Shower/Tub: Producer, television/film/video: Handicapped height     Home Equipment: Production assistant, radio - single point          Prior Functioning/Environment Prior Level of Function : Needs assist;Driving             Mobility Comments: cane at all times, reports no falls ADLs Comments: ind wiht ADL and IADL        OT Problem List: Decreased strength;Decreased range of motion;Decreased activity tolerance;Impaired balance (sitting and/or standing)      OT Treatment/Interventions: Therapeutic exercise;Self-care/ADL training;Energy conservation;DME and/or AE instruction;Therapeutic activities;Balance training;Patient/family education;Cognitive remediation/compensation    OT Goals(Current goals can be found in the care plan section) Acute Rehab OT Goals Patient Stated Goal: none stated OT Goal Formulation: With patient Time For Goal Achievement: 10/07/22 Potential to Achieve Goals: Good ADL Goals Pt Will Perform Upper Body Dressing: with modified independence;sitting Pt Will Perform Lower Body Dressing: with modified independence;sit to/from stand;sitting/lateral leans Pt Will Transfer to Toilet: with modified independence;ambulating;regular height toilet Pt Will Perform Tub/Shower Transfer: Tub transfer;Shower transfer;with modified independence;ambulating;shower seat  OT Frequency: Min 1X/week    Co-evaluation              AM-PAC OT "6 Clicks" Daily Activity     Outcome Measure Help from another person eating meals?: None Help from another person taking care of personal grooming?: A Little Help from another person toileting, which includes using toliet, bedpan, or urinal?: A Little Help from another person bathing (including washing, rinsing, drying)?: A Little Help from another person to put on and taking  off regular upper body clothing?: A Little Help from another person to put on and taking off regular lower body clothing?: A Little 6 Click Score: 19   End of Session Equipment Utilized During Treatment: Rolling walker (2 wheels) Nurse Communication: Mobility status  Activity Tolerance: Patient tolerated treatment well Patient left: in chair;with call bell/phone within reach;with chair alarm set  OT Visit Diagnosis: Unsteadiness on feet (R26.81);Other abnormalities of gait and mobility (R26.89)                Time: 0981-1914 OT Time Calculation (min): 20 min Charges:  OT General Charges $OT Visit: 1 Visit OT Evaluation $OT Eval Low Complexity: 1 Low  Carver Fila, OTD, OTR/L SecureChat Preferred Acute Rehab [phone]   Carver Fila Koonce 09/23/2022, 12:35 PM

## 2022-09-23 NOTE — Plan of Care (Signed)
POC initiated and progressing. 

## 2022-09-23 NOTE — Evaluation (Signed)
Physical Therapy Evaluation Patient Details Name: [name] MRN: [id number] DOB: 04-22-1923 Today's Date: 09/23/2022  History of Present Illness  Pt is a 89 y.o. female who presented 09/20/22 with bright red blood per rectum. S/p superior mesenteric angiogram and coil embolization of right colic branch and left femoral central venous catheter placement 8/8. PMH: anemia, aortic stenosis, CKD stage III, diverticulitis, hematochezia, hx of GI diverticular bleed, HTN, nocturia, OA, peripheral edema   Clinical Impression  Pt presents with condition above and deficits mentioned below, see PT Problem List. PTA, she was mod I using a cane, living alone in a 1-level house with 2-3 STE. Currently, pt demonstrates deficits in strength, endurance, and balance. She is benefiting from the rollator for functional mobility at this time and allows her to sit to rest as needed. She is not needing physical assistance to mobilize while utilizing the rollator. Recommending follow-up with HHPT. Will continue to follow acutely.        If plan is discharge home, recommend the following: A little help with bathing/dressing/bathroom;Assistance with cooking/housework;Assist for transportation;Help with stairs or ramp for entrance   Can travel by private vehicle        Equipment Recommendations Rollator (4 wheels)  Recommendations for Other Services       Functional Status Assessment Patient has had a recent decline in their functional status and demonstrates the ability to make significant improvements in function in a reasonable and predictable amount of time.     Precautions / Restrictions Precautions Precautions: Fall Precaution Comments: L fem access Restrictions Weight Bearing Restrictions: No      Mobility  Bed Mobility               General bed mobility comments: Pt sitting in recliner upon arrival and at end of session    Transfers Overall transfer level: Needs assistance Equipment  used: Rollator (4 wheels) Transfers: Sit to/from Stand Sit to Stand: Contact guard assist           General transfer comment: Educated pt on use of rollator brakes, contact guard assist for safety sitting and standing from rollator.    Ambulation/Gait Ambulation/Gait assistance: Contact guard assist Gait Distance (Feet): 170 Feet (x2 bouts of ~170 ft > ~110 ft) Assistive device: Rollator (4 wheels) Gait Pattern/deviations: Step-through pattern, Decreased stride length, Trunk flexed Gait velocity: reduced Gait velocity interpretation: <1.8 ft/sec, indicate of risk for recurrent falls   General Gait Details: Pt ambulates with a slow, step-through pattern with flexed posture, no LOB, CGA for safety  Stairs            Wheelchair Mobility     Tilt Bed    Modified Rankin (Stroke Patients Only)       Balance Overall balance assessment: Needs assistance Sitting-balance support: Feet supported Sitting balance-Leahy Scale: Good     Standing balance support: During functional activity, Reliant on assistive device for balance, No upper extremity supported Standing balance-Leahy Scale: Fair Standing balance comment: benefits from RW support                             Pertinent Vitals/Pain Pain Assessment Pain Assessment: No/denies pain    Home Living Family/patient expects to be discharged to:: Private residence Living Arrangements: Alone Available Help at Discharge: Family;Friend(s);Available PRN/intermittently Type of Home: House Home Access: Stairs to enter Entrance Stairs-Rails: Can reach both Entrance Stairs-Number of Steps: 2 front/3 back   Home Layout: One level  Home Equipment: Shower seat;Cane - single point      Prior Function Prior Level of Function : Needs assist;Driving             Mobility Comments: cane at all times, reports no falls ADLs Comments: ind wiht ADL and IADL     Extremity/Trunk Assessment   Upper Extremity  Assessment Upper Extremity Assessment: Defer to OT evaluation    Lower Extremity Assessment Lower Extremity Assessment: Generalized weakness    Cervical / Trunk Assessment Cervical / Trunk Assessment: Normal  Communication   Communication Communication: Hearing impairment  Cognition Arousal: Alert Behavior During Therapy: WFL for tasks assessed/performed Overall Cognitive Status: Within Functional Limits for tasks assessed                                 General Comments: needs questions/cues repeated likely due to [facility]        General Comments General comments (skin integrity, edema, etc.): VSS on RA    Exercises     Assessment/Plan    PT Assessment Patient needs continued PT services  PT Problem List Decreased strength;Decreased activity tolerance;Decreased mobility;Decreased balance       PT Treatment Interventions DME instruction;Gait training;Stair training;Functional mobility training;Therapeutic exercise;Therapeutic activities;Balance training;Neuromuscular re-education;Patient/family education    PT Goals (Current goals can be found in the Care Plan section)  Acute Rehab PT Goals Patient Stated Goal: to get a rollator for home PT Goal Formulation: With patient/family Time For Goal Achievement: 10/07/22 Potential to Achieve Goals: Good    Frequency Min 1X/week     Co-evaluation               AM-PAC PT "6 Clicks" Mobility  Outcome Measure Help needed turning from your back to your side while in a flat bed without using bedrails?: A Little Help needed moving from lying on your back to sitting on the side of a flat bed without using bedrails?: A Little Help needed moving to and from a bed to a chair (including a wheelchair)?: A Little Help needed standing up from a chair using your arms (e.g., wheelchair or bedside chair)?: A Little Help needed to walk in hospital room?: A Little Help needed climbing 3-5 steps with a railing? : A  Little 6 Click Score: 18    End of Session Equipment Utilized During Treatment: Gait belt Activity Tolerance: Patient tolerated treatment well Patient left: in chair;with call bell/phone within reach;with chair alarm set;with family/visitor present   PT Visit Diagnosis: Unsteadiness on feet (R26.81);Other abnormalities of gait and mobility (R26.89);Muscle weakness (generalized) (M62.81);Difficulty in walking, not elsewhere classified (R26.2)    Time: 1546-1600 PT Time Calculation (min) (ACUTE ONLY): 14 min   Charges:   PT Evaluation $PT Eval Moderate Complexity: 1 Mod   PT General Charges $$ ACUTE PT VISIT: 1 Visit         Raymond Gurney, PT, DPT Acute Rehabilitation Services  Office: [phone]   Jewel Baize 09/23/2022, 5:34 PM

## 2022-09-23 NOTE — Progress Notes (Addendum)
     Daily Progress Note Intern Pager: [phone]  Patient name: [name] Medical record number: [id number] Date of birth: 11-20-1923 Age: 89 y.o. Gender: female  Primary Care Provider: Erick Alley, DO Consultants: IR, GI Code Status: DNR   Pt Overview and Major Events to Date:  8/8: Admission status post VIR embolization, 1 unit PRBC 8/9: Received 1 unit PRBC 8/11: 1u pRBC   Assessment and Plan: [name] is a 89 y.o. female who presented with diverticular bleed and received mesenteric embolization of the right colic branch.  Hemorrhage has resolved and she has remained hemodynamically stable. Pertinent PMH/PSH includes diverticulosis, HTN, HFpEF, GERD, osteoarthritis.    [facility]     * (Principal) Diverticular bleed     S/p mesenteric embolization by IR 8/8, s/p 2u pRBC. Femoral line still  in place.  Hemoglobin 8.8 >8.1>7.3. No reported bloody BM overnight. -Transfuse 1u pRBC, check post H&H -Appreciate GI -Call IR if bloody BM causes hemodynamic instability -Trend CBC daily - Continue po iron  - vitals per unit routine  - Metoprolol dose decreased to 12.5 mg daily, restarted d/t  cardioprotective effects    - Avoid NSAIDs and aspirin - transfusion threshold < 8         Chronic heart failure (HCC)     HFpEF, last ECHO 3 years ago EF 60-65%, G1DD, moderate aortic stenosis.   Hypotension and bradycardia improved.  Home medications include Imdur 120  mg daily, metoprolol 25 mg daily, Lasix 20 mg daily. -Continue to hold home imdur and lasix. - Restarted metoprolol at 12.5 mg daily - Monitor vitals         Acute blood loss anemia (ABLA)    FEN/GI: Dysphagia 3 diet PPx: SCDs Dispo: pending PT/OT eval. Pending clinical improvement, monitoring s/p transfusion  Subjective:  NAEON, slept well, denies having any BM overnight but states she does have to use the restroom this morning.  Denies abdominal pain, shortness of breath.  Agrees to transfusion as  needed  Objective: Temp:  [97.9 F (36.6 C)-99 F (37.2 C)] 97.9 F (36.6 C) (08/11 0315) Pulse Rate:  [60-74] 74 (08/11 0315) Resp:  [17-18] 18 (08/11 0315) BP: (106-143)/(50-60) 106/55 (08/11 0315) SpO2:  [100 %] 100 % (08/11 0315) Physical Exam: General: NAD, resting comfortably in bed Cardiovascular: RRR, systolic murmur appreciated Respiratory: CTAB normal WOB on RA Abdomen: Soft NT/ND Extremities: No significant edema  Laboratory: Most recent CBC Lab Results  Component Value Date   WBC 5.5 09/23/2022   HGB 7.3 (L) 09/23/2022   HCT 22.8 (L) 09/23/2022   MCV 93.4 09/23/2022   PLT 156 09/23/2022   Most recent BMP    Latest Ref Rng & Units 09/22/2022    4:22 AM  BMP  Glucose 70 - 99 mg/dL 027   BUN 8 - 23 mg/dL 21   Creatinine 2.53 - 1.00 mg/dL 6.64   Sodium 403 - 474 mmol/L 137   Potassium 3.5 - 5.1 mmol/L 4.3   Chloride 98 - 111 mmol/L 107   CO2 22 - 32 mmol/L 23   Calcium 8.9 - 10.3 mg/dL 8.3      Vonna Drafts, MD 09/23/2022, 6:51 AM  PGY-2, [facility] FPTS Intern pager: [phone], text pages welcome Secure chat group [facility] Teaching Service

## 2022-09-24 ENCOUNTER — Other Ambulatory Visit (HOSPITAL_COMMUNITY): Payer: Self-pay

## 2022-09-24 ENCOUNTER — Other Ambulatory Visit: Payer: Self-pay

## 2022-09-24 DIAGNOSIS — K922 Gastrointestinal hemorrhage, unspecified: Secondary | ICD-10-CM | POA: Diagnosis not present

## 2022-09-24 DIAGNOSIS — D62 Acute posthemorrhagic anemia: Secondary | ICD-10-CM | POA: Diagnosis not present

## 2022-09-24 MED ORDER — METOPROLOL SUCCINATE ER 25 MG PO TB24
12.5000 mg | ORAL_TABLET | Freq: Every day | ORAL | 0 refills | Status: DC
  Filled 2022-09-24: qty 30, 60d supply, fill #0

## 2022-09-24 NOTE — Progress Notes (Signed)
Went to patient's bedside to evaluate after being notified by RN of heart rate of 38 bpm on telemetry.  Patient was found resting comfortably in her recliner.  Patient was unaware of low heart rate and denied chest pain or shortness of breath.  On exam patient's heart rate and heart sounds were normal.  Patient's pulse was also stable.  Patient remains stable for discharge at this time.

## 2022-09-24 NOTE — Care Management Important Message (Signed)
Important Message  Patient Details  Name: [name] MRN: [id number] Date of Birth: [birth year]   Medicare Important Message Given:  Yes     Renie Ora 09/24/2022, 9:28 AM

## 2022-09-24 NOTE — Progress Notes (Signed)
Mobility Specialist Progress Note:   09/24/22 1116  Mobility  Activity Ambulated with assistance in hallway  Level of Assistance Contact guard assist, steadying assist  Assistive Device Front wheel walker  Distance Ambulated (ft) 100 ft  Activity Response Tolerated well  Mobility Referral Yes  $Mobility charge 1 Mobility  Mobility Specialist Start Time (ACUTE ONLY) 1055  Mobility Specialist Stop Time (ACUTE ONLY) 1110  Mobility Specialist Time Calculation (min) (ACUTE ONLY) 15 min   Pre Mobility: 55 HR , 131/54 BP  During Mobility: 61 HR  Post Mobility: 58 HR   Pt received in bed, agreeable to mobility. C/o slight fatigue during ambulation otherwise asymptomatic throughout. Pt left in chair with call bell in hand and all needs met.   Leory Plowman  Mobility Specialist Please contact via [employer] office at [phone]

## 2022-09-24 NOTE — Discharge Summary (Cosign Needed Addendum)
Family Medicine Teaching [facility] Discharge Summary  Patient name: [name] Medical record number: [id number] Date of birth: Jun 13, 1923 Age: 89 y.o. Gender: female Date of Admission: 09/20/2022  Date of Discharge: 09/24/2022 Admitting Physician: Lockie Mola, MD  Primary Care Provider: Erick Alley, DO Consultants: GI  Indication for Hospitalization: GI bleed  Discharge Diagnoses/Problem List:  Principal Problem for Admission: GI bleed Other Problems addressed during stay:  Principal Problem:   Diverticular bleed Active Problems:   Chronic heart failure (HCC)   Acute blood loss anemia (ABLA)  Brief Hospital Course:  [name] is a 89 y.o.female with a history of diverticular bleed and mesenteric embolization who was admitted to the family medicine teaching Service at [facility] for diverticular bleed. Her hospital course is detailed below:  Diverticular Bleed Presented to the ED for multiple bouts of continuous rectal bleeding.  Found on CTA to have active arterial extravasation of contrast within the hepatic flexure of the colon as well as pancolonic diverticulosis without diverticulitis. Required 1u pRBC. IR consulted by the ED and mesenteric artery embolization was performed on the day of admission. During the procedure, the patient became hypotensive requiring a fluid bolus and another 1 unit of pRBCs with improvement of her blood pressure. Her hgb improved after this until 8/11 AM when it was found to be 7.3 without any further bloody bowel movements. She received another unit of pRBCs on 8/11 and her post transfusion hemoglobin was 8.9. Her femoral line was removed without issue prior to discharge.  She did continue to have some dark/bloody BM so she remained under observation. However, by 8/12 the patient was no longer having symptoms and she was deemed stable for discharge. Evaluated by PT/OT who recommended home health PT and OT.  She ultimately received total 3  units of PRBC during this admission.   HFpEF Due to hypotension and bradycardia on admission, home blood pressure medications were held initially. The patient continued to have bradycardia to the 50s and was started on metoprolol 12.5mg  daily on 8/9 due to cardioprotective effects and minimal impact on heart rate at this low dose. She remained hemodynamically stable on this dose, and was continued on metoprolol 12.5mg  daily upon discharge. Her home Imdur 120mg  daily and Lasix 20mg  PRN were held throughout the hospital admission due to hypotension. These medications were held on discharge.  Other chronic conditions were medically managed with home medications and formulary alternatives as necessary   PCP Follow-up Recommendations: Recheck CBC Goals of care Avoid NSAIDs, aspirin Readdress restarting Imdur, Lasix, and metoprolol 25mg . These were held due to low pressures  Disposition: home with home health PT/OT  Discharge Condition: stable  Discharge Exam:  Vitals:   09/24/22 0733 09/24/22 1100  BP: (!) 146/71 (!) 131/54  Pulse: (!) 58 (!) 53  Resp: 17 19  Temp: 98.3 F (36.8 C) 98.2 F (36.8 C)  SpO2: 99% 100%  Constitutional:      General: She is not in acute distress. Cardiovascular:     Rate and Rhythm: Normal rate and regular rhythm.     Heart sounds: Normal heart sounds.  Pulmonary:     Effort: Pulmonary effort is normal.     Breath sounds: Normal breath sounds.  Abdominal:     General: Bowel sounds are normal. There is no distension.     Palpations: Abdomen is soft.     Tenderness: There is no abdominal tenderness.  Musculoskeletal:     Right lower leg: Normal. No swelling.  Left lower leg: Normal. No swelling.  Skin:    General: Skin is warm and dry.  Neurological:     Mental Status: She is alert.  Psychiatric:        Mood and Affect: Mood normal.        Speech: Speech normal.   Significant Procedures: SMA and coil embolization of R colic branch with L  femoral central venous catheter placement.  Significant Labs and Imaging:  Recent Labs  Lab 09/23/22 0342 09/23/22 1300 09/24/22 0620  WBC 5.5  --  6.1  HGB 7.3* 8.9* 8.5*  HCT 22.8* 26.8* 25.9*  PLT 156  --  172   No results for input(s): "NA", "K", "CL", "CO2", "GLUCOSE", "BUN", "CREATININE", "CALCIUM", "MG", "PHOS", "ALKPHOS", "AST", "ALT", "ALBUMIN", "PROTEIN" in the last 48 hours.   Results/Tests Pending at Time of Discharge: none  Discharge Medications:  Allergies as of 09/24/2022       Reactions   Ezetimibe Anaphylaxis   Other reaction(s): anaphylaxis   Lipitor [atorvastatin] Swelling   Pravachol [pravastatin] Swelling   Statins Swelling, Other (See Comments)   Swelling of mouth and lips Other reaction(s): anaphylaxis   Zocor [simvastatin] Swelling   Cholestyramine Nausea And Vomiting        Medication List     STOP taking these medications    isosorbide mononitrate 120 MG 24 hr tablet Commonly known as: IMDUR       TAKE these medications    acetaminophen 650 MG CR tablet Commonly known as: TYLENOL Take 650 mg by mouth daily.   albuterol 108 (90 Base) MCG/ACT inhaler Commonly known as: VENTOLIN HFA Inhale 1-2 puffs into the lungs every 6 (six) hours as needed for wheezing or shortness of breath.   diclofenac sodium 1 % Gel Commonly known as: VOLTAREN Apply 2 g topically 2 (two) times daily as needed (pain).   famotidine 20 MG tablet Commonly known as: PEPCID Take 20 mg by mouth 2 (two) times daily.   ferrous sulfate 325 (65 FE) MG EC tablet Take 1 tablet (325 mg total) by mouth daily with breakfast.   furosemide 20 MG tablet Commonly known as: LASIX Take 1 tablet (20 mg total) by mouth daily as needed for edema.   GAVISCON ACID BRKTHRGH FORMULA PO Take 1 tablet by mouth as needed.   GAVISCON PO Take 1 tablet by mouth daily as needed (acid reflux).   lidocaine 5 % Commonly known as: Lidoderm Place 1 patch onto the skin daily. Remove  & Discard patch within 12 hours or as directed by MD   metoprolol succinate 25 MG 24 hr tablet Commonly known as: TOPROL-XL Take 0.5 tablets (12.5 mg total) by mouth daily. Start taking on: September 25, 2022 What changed: how much to take   multivitamin with minerals Tabs tablet Take 1 tablet by mouth daily.   nitroGLYCERIN 0.4 MG SL tablet Commonly known as: NITROSTAT Place 1 tablet (0.4 mg total) under the tongue every 5 (five) minutes as needed for chest pain.   polyvinyl alcohol 1.4 % ophthalmic solution Commonly known as: LIQUIFILM TEARS Place 1 drop into both eyes as needed for dry eyes.   vitamin C 1000 MG tablet Take 1,000 mg by mouth daily.   Vitamin D3 50 MCG (2000 UT) Tabs Take 2,000 Units by mouth daily.       Discharge Instructions: Please refer to Patient Instructions section of EMR for full details.  Patient was counseled important signs and symptoms that should prompt return to  medical care, changes in medications, dietary instructions, activity restrictions, and follow up appointments.   Follow-Up Appointments: Future Appointments  Date Time Provider Department Center  10/01/2022  9:50 AM Erick Alley, DO [facility]  10/09/2022  2:05 PM [facility] ECHO 5 [facility]   Lorayne Bender, MD 09/24/2022, 1:34 PM PGY-1, [facility]  I was personally present and performed medical decision making activities of this service and have verified that the service and findings are accurately documented in the resident's note.  [name], DO                  09/24/2022, 1:34 PM

## 2022-09-24 NOTE — Discharge Instructions (Addendum)
Dear [name],   Thank you for letting us participate in your care! In this section, you will find a brief hospital admission summary of why you were admitted to the hospital, what happened during your admission, your diagnosis/diagnoses, and recommended follow up.  Primary diagnosis: GI bleed Treatment plan: Your hemoglobin was low when you arrived to the hospital. You had a procedure done to stop the bleeding at the source in your colon. You received a total of 3 units of blood during your stay. Your hemoglobin levels were stable at discharge.  Medication changes:  You will take 12.5mg  of metoprolol daily instead of 25mg  daily You will not take Imdur anymore Please hold off on taking Lasix for now Please do not take NSAIDs ((I.e. ibuprofen, aspirin, naproxen), use tylenol as needed for mild pain.  POST-HOSPITAL & CARE INSTRUCTIONS We recommend following up with your PCP within 1 week from being discharged from the hospital. Please let PCP/Specialists know of any changes in medications that were made which you will be able to see in the medications section of this packet.  DOCTOR'S APPOINTMENTS & FOLLOW UP Future Appointments  Date Time Provider Department Center  10/01/2022  9:50 AM Erick Alley, DO [facility]  10/09/2022  2:05 PM MC-CV CH ECHO 5 [facility]SITE[facility]ECHO [facility]     Thank you for choosing [facility]! Take care and be well!  Family Medicine Teaching Service Inpatient Team [place]  [facility]  [address] [place], [place] [address] [phone]

## 2022-09-24 NOTE — Plan of Care (Signed)
POC initiated and progressing. 

## 2022-09-24 NOTE — Progress Notes (Signed)
AVS gone over with pt. All questions answered. CVC removed per order/protocol. Pt taken to exit vie wheelchair where she left in a private vehicle with her daughter.

## 2022-09-24 NOTE — Progress Notes (Signed)
Daily Progress Note Intern Pager: [phone]  Patient name: [name] Medical record number: [id number] Date of birth: 1923/05/30 Age: 89 y.o. Gender: female  Primary Care Provider: Erick Alley, DO Consultants: GI Code Status: DNR  [name] Overview and Major Events to Date:  8/8: Admission status post VIR embolization, 1 unit PRBC 8/9: Received 1 unit PRBC 8/11: 1u pRBC 8/12: Post tx Hgb 8.9 with no further bleeding appreciated  Assessment and Plan: [name] is a 89 y.o. female who presented with diverticular bleed and received mesenteric embolization of the right colic branch.  Hemorrhage has resolved and she has remained hemodynamically stable. Pertinent PMH/PSH includes diverticulosis, HTN, HFpEF, GERD, osteoarthritis.   [facility]     * (Principal) Diverticular bleed     S/p mesenteric embolization by IR 8/8, s/p 2u pRBC. Femoral line still  in place.  Hemoglobin 8.8 >8.1>7.3. No reported bloody BM overnight.  Received 1u pRBC yesterday morning, post tx hemoglobin stable at 8.9 -Consult IR to remove femoral line -Appreciate ongoing GI recommendations -Call IR if bloody BM causes hemodynamic instability -Trend CBC daily - Continue po iron  - vitals per unit routine  - Metoprolol dose decreased to 12.5 mg daily, restarted d/t  cardioprotective effects    - Avoid NSAIDs and aspirin - transfusion threshold < 8         Chronic heart failure (HCC)     HFpEF, last ECHO 3 years ago EF 60-65%, G1DD, moderate aortic stenosis.   Hypotension and bradycardia improved.  Home medications include Imdur 120  mg daily, metoprolol 25 mg daily, Lasix 20 mg daily. -Continue to hold home imdur and lasix. - Restarted metoprolol at 12.5 mg daily - Monitor vitals         Acute blood loss anemia (ABLA)    FEN/GI: Dysphagia 3 PPx: SCDs in the setting of GI bleed Dispo: stable for discharge today with close PCP follow up  Subjective:  [name] states she is feeling well this  morning. Has a mild headache and just took some tylenol for this. States she had a very small bowel movement yesterday, not sure if it was bloody. Denies chest pain, dyspnea, and abdominal pain.   Objective: Temp:  [97.9 F (36.6 C)-98.5 F (36.9 C)] 98.3 F (36.8 C) (08/12 0733) Pulse Rate:  [43-58] 58 (08/12 0733) Resp:  [16-18] 17 (08/12 0733) BP: (111-148)/(45-90) 146/71 (08/12 0733) SpO2:  [99 %-100 %] 99 % (08/12 0733)  Physical Exam Constitutional:      General: She is not in acute distress. Cardiovascular:     Rate and Rhythm: Normal rate and regular rhythm.     Heart sounds: Normal heart sounds.  Pulmonary:     Effort: Pulmonary effort is normal.     Breath sounds: Normal breath sounds.  Abdominal:     General: Bowel sounds are normal. There is no distension.     Palpations: Abdomen is soft.     Tenderness: There is no abdominal tenderness.  Musculoskeletal:     Right lower leg: Normal. No swelling.     Left lower leg: Normal. No swelling.  Skin:    General: Skin is warm and dry.  Neurological:     Mental Status: She is alert.  Psychiatric:        Mood and Affect: Mood normal.        Speech: Speech normal.    Laboratory: Most recent CBC Lab Results  Component  Value Date   WBC 6.1 09/24/2022   HGB 8.5 (L) 09/24/2022   HCT 25.9 (L) 09/24/2022   MCV 94.5 09/24/2022   PLT 172 09/24/2022   Most recent BMP    Latest Ref Rng & Units 09/22/2022    4:22 AM  BMP  Glucose 70 - 99 mg/dL 161   BUN 8 - 23 mg/dL 21   Creatinine 0.96 - 1.00 mg/dL 0.45   Sodium 409 - 811 mmol/L 137   Potassium 3.5 - 5.1 mmol/L 4.3   Chloride 98 - 111 mmol/L 107   CO2 22 - 32 mmol/L 23   Calcium 8.9 - 10.3 mg/dL 8.3      , Tacey Ruiz, MD 09/24/2022, 8:49 AM  PGY-1, [place] [facility] FPTS Intern pager: [phone], text pages welcome Secure chat group [facility] Teaching Service

## 2022-09-24 NOTE — TOC Transition Note (Signed)
Transition of Care (TOC) - CM/SW Discharge Note Donn Pierini RN, BSN Transitions of Care Unit 4E- RN Case Manager See Treatment Team for direct phone #   Patient Details  Name: [name] MRN: [id number] Date of Birth: 09-Jul-1923  Transition of Care [facility]) CM/SW Contact:  Darrold Span, RN Phone Number: 09/24/2022, 2:02 PM   Clinical Narrative:    Pt stable for transition home today, family to transport home.  Orders placed for HHPT/OT,   CM in to speak with pt, discussed HH needs- per pt she has been to outpt therapy in the past but transportation was difficulty 2-3x a week. Pt voiced HH may work better. List provided for choice Per CMS guidelines from [employer] website with star ratings (copy placed in shadow chart), after looking at list pt voiced she would like to f/u with her PCP regarding HH needs before setting anything up. CM explained that PCP would need to make a referral should she decide she wants services. Pt voiced understanding, and states she will hold off on [facility] referral at this time- PCP f/u is in 2 wks per pt.   DME discussed- pt voiced therapy spoke with her about a rollator, pt only has walker w/ no wheels at home- will request DME order for rollator- pt states she does not have preference for provider and agreeable to deliver to room prior to discharge.   Call made to [employer] for DME need- rollator to deliver to room once processed.   No further TOC needs noted.    Final next level of care: Home/Self Care Barriers to Discharge: No Barriers Identified   Patient Goals and CMS Choice CMS Medicare.gov Compare Post Acute Care list provided to:: Patient Choice offered to / list presented to : Patient  Discharge Placement               Home          Discharge Plan and Services Additional resources added to the After Visit Summary for     Discharge Planning Services: CM Consult Post Acute Care Choice: Durable Medical Equipment, Home  Health          DME Arranged: Walker rolling with seat DME Agency: [employer] Date DME Agency Contacted: 09/24/22 Time DME Agency Contacted: [phone] Representative spoke with at DME Agency: Zack HH Arranged: PT, OT, Patient Refused HH (Pt wants to f/u with her PCP first) HH Agency: NA        Social Determinants of Health (SDOH) Interventions SDOH Screenings   Food Insecurity: Unknown (09/30/2017)  Transportation Needs: Unknown (09/30/2017)  Utilities: Not At Risk (09/20/2022)  Depression (PHQ2-9): Low Risk  (07/20/2022)  Recent Concern: Depression (PHQ2-9) - Medium Risk (06/12/2022)  Financial Resource Strain: Low Risk  (09/30/2017)  Physical Activity: Insufficiently Active (09/30/2017)  Social Connections: Somewhat Isolated (09/30/2017)  Stress: No Stress Concern Present (09/30/2017)  Tobacco Use: Medium Risk (09/21/2022)     Readmission Risk Interventions    09/24/2022    2:02 PM  Readmission Risk Prevention Plan  Post Dischage Appt --  Medication Screening Complete  Transportation Screening Complete

## 2022-09-24 NOTE — Assessment & Plan Note (Addendum)
S/p mesenteric embolization by IR 8/8, s/p 2u pRBC. Femoral line still in place.  Hemoglobin 8.8 >8.1>7.3. No reported bloody BM overnight. Received 1u pRBC yesterday morning, post tx hemoglobin stable at 8.9 -Remove femoral line today -Appreciate ongoing GI recommendations -Call IR if bloody BM causes hemodynamic instability -Trend CBC daily - Continue po iron  - vitals per unit routine  - Metoprolol dose decreased to 12.5 mg daily, restarted d/t cardioprotective effects    - Avoid NSAIDs and aspirin - transfusion threshold < 8

## 2022-09-24 NOTE — Assessment & Plan Note (Signed)
HFpEF, last ECHO 3 years ago EF 60-65%, G1DD, moderate aortic stenosis.  Hypotension and bradycardia improved.  Home medications include Imdur 120 mg daily, metoprolol 25 mg daily, Lasix 20 mg daily. -Continue to hold home imdur and lasix. - Restarted metoprolol at 12.5 mg daily - Monitor vitals

## 2022-09-25 ENCOUNTER — Telehealth: Payer: Self-pay | Admitting: Cardiovascular Disease

## 2022-09-25 ENCOUNTER — Telehealth: Payer: Self-pay

## 2022-09-25 NOTE — Telephone Encounter (Signed)
Pt c/o medication issue:  1. Name of Medication:   metoprolol succinate (TOPROL-XL) 25 MG 24 hr tablet   2. How are you currently taking this medication (dosage and times per day)? Started taking 1/2 tablet in hospital   3. Are you having a reaction (difficulty breathing--STAT)?   4. What is your medication issue?   Patient stated she took 1/2 tablet dose of this medication today around 12 noon and her heart started pounding for about 10-15 minutes then eased off.  Patient wants to know next steps.

## 2022-09-25 NOTE — Progress Notes (Signed)
09/25/22 1300  Post discharge note: Pt called and was concerned that medication she was prescribed was making her heart race. She said she was taking a whole pill but hospital only giving 1/2 tablet and that is what she took. I explained that metoprolol should not cause her heart to beat faster as it was generally given to reduce heart rate.  Pt states she has appointment August 19 and she does not want to come back to hospital. I told her she need to call her Doctor and let them know what is going on as they may want to change her medication or see her earlier. Heart not racing while on phone. Explained that if she continued to have problem she may need to come to hospital. She says she will call doctor's office. Thomas Hoff, RN

## 2022-09-25 NOTE — Transitions of Care (Post Inpatient/ED Visit) (Signed)
   09/25/2022  Name: [name] MRN: [id number] DOB: [birth year]  Today's TOC FU Call Status: Today's TOC FU Call Status:: Unsuccessful Call (1st Attempt) Unsuccessful Call (1st Attempt) Date: 09/25/22  Attempted to reach the patient regarding the most recent Inpatient/ED visit.  Follow Up Plan: Additional outreach attempts will be made to reach the patient to complete the Transitions of Care (Post Inpatient/ED visit) call.   Signature Karena Addison, LPN [facility] Nurse Health Advisor Direct Dial [phone]

## 2022-09-27 NOTE — Telephone Encounter (Signed)
Returned call to patient to inform her that the beta blocker should slow HR, not "make it pound." She states she hasn't had this issue since and has continued to take it.  Per Nahser's OV note on 09/18/22: 4.   Palpitations: She had an event monitor 3 years ago which revealed episodes of nonsustained ventricular tachycardia.  She may be having more ventricular tachycardia now.  Will start her on Toprol-XL 25 mg a day.  Will see her again in 6 months for follow-up.  Pt seen in hospital on 09/20/22 and Metoprolol was cut to 1/2 tablet daily and Isosorbide stopped entirely. Asked her to call us back if she starts having any issues.

## 2022-09-29 ENCOUNTER — Other Ambulatory Visit: Payer: Self-pay | Admitting: Student

## 2022-09-29 DIAGNOSIS — R0602 Shortness of breath: Secondary | ICD-10-CM

## 2022-10-01 ENCOUNTER — Other Ambulatory Visit: Payer: Self-pay

## 2022-10-01 ENCOUNTER — Ambulatory Visit (INDEPENDENT_AMBULATORY_CARE_PROVIDER_SITE_OTHER): Payer: 59 | Admitting: Student

## 2022-10-01 ENCOUNTER — Encounter: Payer: Self-pay | Admitting: Student

## 2022-10-01 VITALS — BP 147/59 | HR 58 | Ht 63.0 in | Wt 176.8 lb

## 2022-10-01 DIAGNOSIS — R42 Dizziness and giddiness: Secondary | ICD-10-CM

## 2022-10-01 DIAGNOSIS — D62 Acute posthemorrhagic anemia: Secondary | ICD-10-CM | POA: Diagnosis not present

## 2022-10-01 DIAGNOSIS — I509 Heart failure, unspecified: Secondary | ICD-10-CM | POA: Diagnosis not present

## 2022-10-01 HISTORY — DX: Dizziness and giddiness: R42

## 2022-10-01 NOTE — Assessment & Plan Note (Addendum)
In setting of diverticular bleed, required mesenteric artery embolization during hospitalization.  No blood in stool since discharge.  Anemia could be contributing to SOB, palpitations, and dizziness  -CBC today -Continue iron supplement -Return/go to ED if GI bleed returns

## 2022-10-01 NOTE — Patient Instructions (Signed)
It was great to see you! Thank you for allowing me to participate in your care!  I recommend that you always bring your medications to each appointment as this makes it easy to ensure you are on the correct medications and helps [place] not miss when refills are needed.  Our plans for today:  - Continue to hold Imdur  - Continue metoprolol 12.5 mg daily (1/2 tablet) - Go to your heart ultrasound on 8/27  We are checking some labs today, I will call you if they are abnormal will send you a MyChart message or a letter if they are normal.  If you do not hear about your labs in the next 2 weeks please let us know.  Take care and seek immediate care sooner if you develop any concerns.   Dr. Erick Alley, DO [facility]

## 2022-10-01 NOTE — Assessment & Plan Note (Addendum)
Appears euvolemic on exam today. Lung exam reassuring and SpO2 100%.  BP stable and orthostatic vitals negative.  -echo scheduled for 10/09/22 -Continue metoprolol 12.5 mg due to mild bradycardia -d/c imdur d/t dizziness, concern for orthostatic hypotension, bradycardia

## 2022-10-01 NOTE — Progress Notes (Signed)
    SUBJECTIVE:   CHIEF COMPLAINT / HPI:   GI bleed -hospitalized 8/8-8/12 for diverticular GI bleed causing acute blood loss anemia -IR performed mesenteric artery embolization -Required a total of 3 U PRBCs during hospitalization -At discharge was no longer having bloody stools -Hemoglobin at discharge 8.5  HFpEF  Palpations -Was started on metoprolol 25 mg by cardiology for palpitations d/t concern for ventricular tachycardia (seen on heart monitor 3 years ago). This was decreased to 12.5 mg at discharge d/t hypotension  -Home Imdur and Lasix as needed were held at discharge in setting of hypotension -Has echo scheduled for 10/09/22  Today pt states she is no longer having bloody stools. Has been a little dizzy only with standing - this has been going on for at least 3 months. Does have PMHx of orthostatic hypotension. Also feeling a little SOB with exertion and still feeling like heart is pounding sometimes, these are not occurring at the same time.  PERTINENT  PMH / PSH: CAD, HTN, orthostatic hypotension, HFpEF, CKD  OBJECTIVE:   BP (!) 147/59   Pulse (!) 58   Ht 5\' 3"  (1.6 m)   Wt 176 lb 12.8 oz (80.2 kg)   SpO2 100%   BMI 31.32 kg/m    General: NAD, pleasant, able to participate in exam Cardiac: RRR, Systolic murmur Respiratory: CTAB, normal effort, No wheezes, rales or rhonchi Extremities: no edema of BLEs Skin: warm and dry Neuro: alert, no obvious focal deficits Psych: Normal affect and mood  ASSESSMENT/PLAN:   Acute blood loss anemia (ABLA) In setting of diverticular bleed, required mesenteric artery embolization during hospitalization.  No blood in stool since discharge.  Anemia could be contributing to SOB, palpitations, and dizziness  -CBC today -Continue iron supplement -Return/go to ED if GI bleed returns  Chronic heart failure (HCC) Appears euvolemic on exam today. Lung exam reassuring and SpO2 100%.  BP stable and orthostatic vitals negative.  -echo  scheduled for 10/09/22 -Continue metoprolol 12.5 mg due to mild bradycardia -d/c imdur d/t dizziness, concern for orthostatic hypotension, bradycardia     Dr. Erick Alley, DO [facility]

## 2022-10-02 ENCOUNTER — Encounter: Payer: Self-pay | Admitting: Student

## 2022-10-02 LAB — CBC
Hematocrit: 30 % — ABNORMAL LOW (ref 34.0–46.6)
Hemoglobin: 10 g/dL — ABNORMAL LOW (ref 11.1–15.9)
MCH: 29.9 pg (ref 26.6–33.0)
MCHC: 33.3 g/dL (ref 31.5–35.7)
MCV: 90 fL (ref 79–97)
Platelets: 354 10*3/uL (ref 150–450)
RBC: 3.35 x10E6/uL — ABNORMAL LOW (ref 3.77–5.28)
RDW: 13 % (ref 11.7–15.4)
WBC: 4.9 10*3/uL (ref 3.4–10.8)

## 2022-10-04 NOTE — Transitions of Care (Post Inpatient/ED Visit) (Signed)
   10/04/2022  Name: [name] MRN: [id number] DOB: 1923/07/30  Today's TOC FU Call Status: Today's TOC FU Call Status:: Unsuccessful Call (2nd Attempt) Unsuccessful Call (1st Attempt) Date: 09/25/22 Unsuccessful Call (2nd Attempt) Date: 10/04/22  Attempted to reach the patient regarding the most recent Inpatient/ED visit.  Follow Up Plan: Additional outreach attempts will be made to reach the patient to complete the Transitions of Care (Post Inpatient/ED visit) call.   Signature Karena Addison, LPN [facility] Nurse Health Advisor Direct Dial [phone]

## 2022-10-09 ENCOUNTER — Ambulatory Visit (HOSPITAL_COMMUNITY): Payer: 59 | Attending: Cardiology

## 2022-10-09 DIAGNOSIS — I350 Nonrheumatic aortic (valve) stenosis: Secondary | ICD-10-CM

## 2022-10-09 DIAGNOSIS — I4729 Other ventricular tachycardia: Secondary | ICD-10-CM

## 2022-10-09 LAB — ECHOCARDIOGRAM COMPLETE
AR max vel: 1.16 cm2
AV Area VTI: 1.13 cm2
AV Area mean vel: 1.11 cm2
AV Mean grad: 14.8 mmHg
AV Peak grad: 26 mmHg
Ao pk vel: 2.55 m/s
Area-P 1/2: 3.75 cm2
MV M vel: 4.74 m/s
MV Peak grad: 89.9 mmHg
MV VTI: 1.57 cm2
S' Lateral: 3.23 cm

## 2022-10-10 ENCOUNTER — Ambulatory Visit (INDEPENDENT_AMBULATORY_CARE_PROVIDER_SITE_OTHER): Payer: 59

## 2022-10-10 VITALS — Ht 63.0 in | Wt 176.0 lb

## 2022-10-10 DIAGNOSIS — Z0000 Encounter for general adult medical examination without abnormal findings: Secondary | ICD-10-CM | POA: Diagnosis not present

## 2022-10-10 NOTE — Progress Notes (Signed)
Subjective:   [name] is a 89 y.o. female who presents for an Initial Medicare Annual Wellness Visit.  Visit Complete: Virtual  I connected with  [name] on 10/10/22 by a audio enabled telemedicine application and verified that I am speaking with the correct person using two identifiers.  Patient Location: Home  Provider Location: Home Office  I discussed the limitations of evaluation and management by telemedicine. The patient expressed understanding and agreed to proceed.  Vital Signs: Because this visit was a virtual/telehealth visit, some criteria may be missing or patient reported. Any vitals not documented were not able to be obtained and vitals that have been documented are patient reported.   Review of Systems     Cardiac Risk Factors include: advanced age (>47men, >51 women);hypertension     Objective:    Today's Vitals   10/10/22 1356  Weight: 176 lb (79.8 kg)  Height: 5\' 3"  (1.6 m)   Body mass index is 31.18 kg/m.     10/10/2022    2:03 PM 09/20/2022    6:34 PM 09/20/2022    7:41 AM 08/02/2022    9:10 AM 06/12/2022    2:48 PM 04/10/2022    3:23 PM 01/01/2022    3:09 PM  Advanced Directives  Does Patient Have a Medical Advance Directive? No  No No No No No  Does patient want to make changes to medical advance directive?      No - Patient declined   Would patient like information on creating a medical advance directive? Yes (MAU/Ambulatory/Procedural Areas - Information given) No - Patient declined   No - Patient declined      Current Medications (verified) Outpatient Encounter Medications as of 10/10/2022  Medication Sig   acetaminophen (TYLENOL) 650 MG CR tablet Take 650 mg by mouth daily.   Alum Hydroxide-Mag Carbonate (GAVISCON PO) Take 1 tablet by mouth daily as needed (acid reflux).   Ascorbic Acid (VITAMIN C) 1000 MG tablet Take 1,000 mg by mouth daily.   diclofenac sodium (VOLTAREN) 1 % GEL Apply 2 g topically 2 (two) times daily as needed  (pain).   ferrous sulfate 325 (65 FE) MG EC tablet Take 1 tablet (325 mg total) by mouth daily with breakfast.   furosemide (LASIX) 20 MG tablet Take 1 tablet (20 mg total) by mouth daily as needed for edema.   metoprolol succinate (TOPROL-XL) 25 MG 24 hr tablet Take 0.5 tablets (12.5 mg total) by mouth daily.   Multiple Vitamin (MULITIVITAMIN WITH MINERALS) TABS Take 1 tablet by mouth daily.   nitroGLYCERIN (NITROSTAT) 0.4 MG SL tablet Place 1 tablet (0.4 mg total) under the tongue every 5 (five) minutes as needed for chest pain.   polyvinyl alcohol (LIQUIFILM TEARS) 1.4 % ophthalmic solution Place 1 drop into both eyes as needed for dry eyes.   VENTOLIN HFA 108 (90 Base) MCG/ACT inhaler USE 1 TO 2 INHALATIONS BY MOUTH  EVERY 6 HOURS AS NEEDED FOR  WHEEZING OR SHORTNESS OF BREATH   Calcium Carbonate Antacid (GAVISCON ACID BRKTHRGH FORMULA PO) Take 1 tablet by mouth as needed. (Patient not taking: Reported on 09/20/2022)   Cholecalciferol (VITAMIN D3) 2000 units TABS Take 2,000 Units by mouth daily. (Patient not taking: Reported on 09/20/2022)   famotidine (PEPCID) 20 MG tablet Take 20 mg by mouth 2 (two) times daily. (Patient not taking: Reported on 10/10/2022)   lidocaine (LIDODERM) 5 % Place 1 patch onto the skin daily. Remove & Discard patch within 12  hours or as directed by MD (Patient not taking: Reported on 10/10/2021)   No facility-administered encounter medications on file as of 10/10/2022.    Allergies (verified) Ezetimibe, Lipitor [atorvastatin], Pravachol [pravastatin], Statins, Zocor [simvastatin], and Cholestyramine   History: Past Medical History:  Diagnosis Date   Acid reflux    takes Nexium daily   Anemia    takes Ferrous Sulfate daily   Aortic stenosis    mild AS pk grad 18, mean grad 9, AVA 1.32 cm 03/2015 echo (Dr. Jacinto Halim)   Arthritis    Chronic kidney disease (CKD), stage III (moderate) (HCC)    Diverticulitis    Hematochezia 03/2015   High cholesterol    can't take the  meds   History of blood transfusion 03/2015   no abnormal reaction noted   History of GI diverticular bleed    HTN (hypertension)    takes Losartan-HCTZ and Metoprolol daily   Joint pain    Joint swelling    Nocturia    Numbness    in legs   Osteoarthritis of left hip 05/17/2015   Peripheral edema    occasionally   Shortness of breath dyspnea    occasionally and with exertion   Slow urinary stream    at times   Past Surgical History:  Procedure Laterality Date   ABCESS DRAINAGE     abdominal abcess   cataract surgery Bilateral    CHOLECYSTECTOMY     DILATION AND CURETTAGE OF UTERUS     ESOPHAGOGASTRODUODENOSCOPY N/A 02/23/2015   Procedure: ESOPHAGOGASTRODUODENOSCOPY (EGD);  Surgeon: Carman Ching, MD;  Location: [facility] ENDOSCOPY;  Service: Endoscopy;  Laterality: N/A;   FLEXIBLE SIGMOIDOSCOPY N/A 02/24/2015   Procedure: FLEXIBLE SIGMOIDOSCOPY;  Surgeon: Carman Ching, MD;  Location: [facility] ENDOSCOPY;  Service: Endoscopy;  Laterality: N/A;   IR ANGIOGRAM SELECTIVE EACH ADDITIONAL VESSEL  03/06/2017   IR ANGIOGRAM SELECTIVE EACH ADDITIONAL VESSEL  03/06/2017   IR ANGIOGRAM SELECTIVE EACH ADDITIONAL VESSEL  03/06/2017   IR ANGIOGRAM SELECTIVE EACH ADDITIONAL VESSEL  03/08/2017   IR ANGIOGRAM VISCERAL SELECTIVE  03/06/2017   IR ANGIOGRAM VISCERAL SELECTIVE  03/06/2017   IR ANGIOGRAM VISCERAL SELECTIVE  03/06/2017   IR ANGIOGRAM VISCERAL SELECTIVE  03/08/2017   IR ANGIOGRAM VISCERAL SELECTIVE  03/08/2017   IR ANGIOGRAM VISCERAL SELECTIVE  09/20/2022   IR EMBO ART  VEN HEMORR LYMPH EXTRAV  INC GUIDE ROADMAPPING  03/06/2017   IR EMBO ART  VEN HEMORR LYMPH EXTRAV  INC GUIDE ROADMAPPING  03/08/2017   IR EMBO ART  VEN HEMORR LYMPH EXTRAV  INC GUIDE ROADMAPPING  09/20/2022   IR FLUORO GUIDE CV LINE LEFT  09/20/2022   IR US GUIDE VASC ACCESS LEFT  09/20/2022   IR US GUIDE VASC ACCESS RIGHT  03/06/2017   IR US GUIDE VASC ACCESS RIGHT  03/08/2017   IR US GUIDE VASC ACCESS RIGHT  09/20/2022   left knee surgery      TOTAL HIP ARTHROPLASTY Left 05/17/2015   Procedure: LEFT TOTAL HIP ARTHROPLASTY ANTERIOR APPROACH;  Surgeon: Kathryne Hitch, MD;  Location: [facility] OR;  Service: Orthopedics;  Laterality: Left;   Family History  Problem Relation Age of Onset   Diabetes Other    Hypertension Other    Cancer Other    Heart disease Other    Social History   Socioeconomic History   Marital status: Widowed    Spouse name: Not on file   Number of children: Not on file   Years of education: Not on  file   Highest education level: Not on file  Occupational History   Not on file  Tobacco Use   Smoking status: Former    Passive exposure: Past   Smokeless tobacco: Never   Tobacco comments:    quit smoking 8+ yrs ago  Vaping Use   Vaping status: Never Used  Substance and Sexual Activity   Alcohol use: No    Comment: quit yrs ago   Drug use: No   Sexual activity: Not Currently  Other Topics Concern   Not on file  Social History Narrative   Not on file   Social Determinants of Health   Financial Resource Strain: Low Risk  (10/10/2022)   Overall Financial Resource Strain (CARDIA)    Difficulty of Paying Living Expenses: Not very hard  Food Insecurity: No Food Insecurity (10/10/2022)   Hunger Vital Sign    Worried About Running Out of Food in the Last Year: Never true    Ran Out of Food in the Last Year: Never true  Transportation Needs: No Transportation Needs (10/10/2022)   PRAPARE - Administrator, Civil Service (Medical): No    Lack of Transportation (Non-Medical): No  Physical Activity: Inactive (10/10/2022)   Exercise Vital Sign    Days of Exercise per Week: 0 days    Minutes of Exercise per Session: 0 min  Stress: No Stress Concern Present (10/10/2022)   [employer] of Occupational Health - Occupational Stress Questionnaire    Feeling of Stress : Not at all  Social Connections: Moderately Isolated (10/10/2022)   Social Connection and Isolation Panel [NHANES]    Frequency  of Communication with Friends and Family: More than three times a week    Frequency of Social Gatherings with Friends and Family: Three times a week    Attends Religious Services: 1 to 4 times per year    Active Member of Clubs or Organizations: No    Attends Banker Meetings: Never    Marital Status: Widowed    Tobacco Counseling Counseling given: Not Answered Tobacco comments: quit smoking 8+ yrs ago   Clinical Intake:  Pre-visit preparation completed: Yes  Pain : No/denies pain     Diabetes: No  How often do you need to have someone help you when you read instructions, pamphlets, or other written materials from your doctor or pharmacy?: 1 - Never  Interpreter Needed?: No  Information entered by :: Kandis Fantasia LPN   Activities of Daily Living    10/10/2022    2:03 PM 09/20/2022    6:34 PM  In your present state of health, do you have any difficulty performing the following activities:  Hearing? 0 1  Vision? 0 0  Difficulty concentrating or making decisions? 0 0  Walking or climbing stairs? 1 1  Dressing or bathing? 0 0  Doing errands, shopping? 0 0  Preparing Food and eating ? N   Using the Toilet? N   In the past six months, have you accidently leaked urine? Y   Do you have problems with loss of bowel control? N   Managing your Medications? N   Managing your Finances? N   Housekeeping or managing your Housekeeping? N     Patient Care Team: Erick Alley, DO as PCP - General (Family Medicine) Nahser, Deloris Ping, MD as PCP - Cardiology (Cardiology) Orpah Cobb, MD as Consulting Physician (Cardiology) Kennon Rounds as Physician Assistant (Cardiology)  Indicate any recent Medical Services you may have  received from other than [facility] providers in the past year (date may be approximate).     Assessment:   This is a routine wellness examination for [name].  Hearing/Vision screen Hearing Screening - Comments:: Denies hearing difficulties    Vision Screening - Comments:: No vision problems; will schedule routine eye exam soon    Dietary issues and exercise activities discussed:     Goals Addressed             This Visit's Progress    Remain active and independent        Depression Screen    10/10/2022    2:01 PM 10/01/2022    9:17 AM 07/20/2022    8:51 AM 06/12/2022    3:15 PM 04/10/2022    3:23 PM 02/15/2022   11:12 AM 01/01/2022    2:17 PM  PHQ 2/9 Scores  PHQ - 2 Score 0  0 3 1 0 0  PHQ- 9 Score 1  1 5 6  3   Exception Documentation  Patient refusal         Fall Risk    10/10/2022    2:02 PM 10/01/2022    9:17 AM 07/20/2022    8:46 AM 06/12/2022    3:15 PM 04/10/2022    3:26 PM  Fall Risk   Falls in the past year? 0 0 0 0 1  Number falls in past yr: 0 0 0 0 0  Injury with Fall? 0 0  0   Risk for fall due to : Impaired mobility;Impaired balance/gait  No Fall Risks Impaired balance/gait;Impaired mobility   Follow up Falls prevention discussed;Education provided;Falls evaluation completed  Falls evaluation completed Falls prevention discussed     MEDICARE RISK AT HOME: Medicare Risk at Home Any stairs in or around the home?: No If so, are there any without handrails?: No Home free of loose throw rugs in walkways, pet beds, electrical cords, etc?: Yes Adequate lighting in your home to reduce risk of falls?: Yes Life alert?: No Use of a cane, walker or w/c?: Yes Grab bars in the bathroom?: Yes Shower chair or bench in shower?: No Elevated toilet seat or a handicapped toilet?: Yes  TIMED UP AND GO:  Was the test performed? No    Cognitive Function:        10/10/2022    2:03 PM  6CIT Screen  What Year? 0 points  What month? 0 points  What time? 0 points  Count back from 20 0 points  Months in reverse 2 points  Repeat phrase 2 points  Total Score 4 points    Immunizations Immunization History  Administered Date(s) Administered   COVID-19, mRNA, vaccine(Comirnaty)12 years and older  01/01/2022   Fluad Quad(high Dose 65+) 02/02/2020, 01/01/2022   Influenza,inj,Quad PF,6+ Mos 11/14/2012   Influenza-Unspecified 11/12/2013   Moderna Sars-Covid-2 Vaccination 06/18/2019, 07/16/2019, 01/25/2020   PFIZER Comirnaty(Gray Top)Covid-19 Tri-Sucrose Vaccine 07/20/2022   PNEUMOCOCCAL CONJUGATE-20 02/17/2021   PPD Test 05/20/2015   Pfizer Covid-19 Vaccine Bivalent Booster 44yrs & up 02/07/2021    TDAP status: Due, Education has been provided regarding the importance of this vaccine. Advised may receive this vaccine at local pharmacy or Health Dept. Aware to provide a copy of the vaccination record if obtained from local pharmacy or Health Dept. Verbalized acceptance and understanding.  Flu Vaccine status: Due, Education has been provided regarding the importance of this vaccine. Advised may receive this vaccine at local pharmacy or Health Dept. Aware to provide a copy of  the vaccination record if obtained from local pharmacy or Health Dept. Verbalized acceptance and understanding.  Pneumococcal vaccine status: Up to date  Covid-19 vaccine status: Information provided on how to obtain vaccines.   Qualifies for Shingles Vaccine? Yes   Zostavax completed No   Shingrix Completed?: No.    Education has been provided regarding the importance of this vaccine. Patient has been advised to call insurance company to determine out of pocket expense if they have not yet received this vaccine. Advised may also receive vaccine at local pharmacy or Health Dept. Verbalized acceptance and understanding.  Screening Tests Health Maintenance  Topic Date Due   DTaP/Tdap/Td (1 - Tdap) Never done   Zoster Vaccines- Shingrix (1 of 2) Never done   DEXA SCAN  Never done   COVID-19 Vaccine (7 - 2023-24 season) 09/14/2022   INFLUENZA VACCINE  09/13/2022   Medicare Annual Wellness (AWV)  10/10/2023   Pneumonia Vaccine 9+ Years old  Completed   HPV VACCINES  Aged Out    Health Maintenance  Health  Maintenance Due  Topic Date Due   DTaP/Tdap/Td (1 - Tdap) Never done   Zoster Vaccines- Shingrix (1 of 2) Never done   DEXA SCAN  Never done   COVID-19 Vaccine (7 - 2023-24 season) 09/14/2022   INFLUENZA VACCINE  09/13/2022    Colorectal cancer screening: No longer required.   Mammogram status: No longer required due to age and preference.  Bone Density status:  Patient declines at this time   Lung Cancer Screening: (Low Dose CT Chest recommended if Age 26-80 years, 20 pack-year currently smoking OR have quit w/in 15years.) does not qualify.   Lung Cancer Screening Referral: n/a  Additional Screening:  Hepatitis C Screening: does not qualify  Vision Screening: Recommended annual ophthalmology exams for early detection of glaucoma and other disorders of the eye. Is the patient up to date with their annual eye exam?  No  Who is the provider or what is the name of the office in which the patient attends annual eye exams? none If pt is not established with a provider, would they like to be referred to a provider to establish care? No .   Dental Screening: Recommended annual dental exams for proper oral hygiene  Community Resource Referral / Chronic Care Management: CRR required this visit?  No   CCM required this visit?  No     Plan:     I have personally reviewed and noted the following in the patient's chart:   Medical and social history Use of alcohol, tobacco or illicit drugs  Current medications and supplements including opioid prescriptions. Patient is not currently taking opioid prescriptions. Functional ability and status Nutritional status Physical activity Advanced directives List of other physicians Hospitalizations, surgeries, and ER visits in previous 12 months Vitals Screenings to include cognitive, depression, and falls Referrals and appointments  In addition, I have reviewed and discussed with patient certain preventive protocols, quality metrics,  and best practice recommendations. A written personalized care plan for preventive services as well as general preventive health recommendations were provided to patient.     Kandis Fantasia [place], [place]   [id number]   After Visit Summary: (Mail) Due to this being a telephonic visit, the after visit summary with patients personalized plan was offered to patient via mail   Nurse Notes: No concerns at this time

## 2022-10-10 NOTE — Patient Instructions (Signed)
Ms. Arbuthnot , Thank you for taking time to come for your Medicare Wellness Visit. I appreciate your ongoing commitment to your health goals. Please review the following plan we discussed and let me know if I can assist you in the future.   Referrals/Orders/Follow-Ups/Clinician Recommendations: Aim for 30 minutes of exercise or brisk walking, 6-8 glasses of water, and 5 servings of fruits and vegetables each day.  This is a list of the screening recommended for you and due dates:  Health Maintenance  Topic Date Due   DTaP/Tdap/Td vaccine (1 - Tdap) Never done   Zoster (Shingles) Vaccine (1 of 2) Never done   DEXA scan (bone density measurement)  Never done   COVID-19 Vaccine (7 - 2023-24 season) 09/14/2022   Flu Shot  09/13/2022   Medicare Annual Wellness Visit  10/10/2023   Pneumonia Vaccine  Completed   HPV Vaccine  Aged Out    Advanced directives: (ACP Link)Information on Advanced Care Planning can be found at [facility] Advance Health Care Directives Advance Health Care Directives ([email or web address]   Next Medicare Annual Wellness Visit scheduled for next year: Yes

## 2022-10-12 ENCOUNTER — Telehealth: Payer: Self-pay | Admitting: Cardiovascular Disease

## 2022-10-12 NOTE — Telephone Encounter (Signed)
Follow Up:     Patient is returning a call, concerning her Echo results.

## 2022-10-17 NOTE — Telephone Encounter (Signed)
Returned call to patient and discussed ECHO findings. All questions answered.

## 2022-12-17 DIAGNOSIS — H9113 Presbycusis, bilateral: Secondary | ICD-10-CM | POA: Insufficient documentation

## 2022-12-17 DIAGNOSIS — H903 Sensorineural hearing loss, bilateral: Secondary | ICD-10-CM | POA: Insufficient documentation

## 2023-03-07 ENCOUNTER — Encounter: Payer: Self-pay | Admitting: Student

## 2023-03-07 ENCOUNTER — Ambulatory Visit (INDEPENDENT_AMBULATORY_CARE_PROVIDER_SITE_OTHER): Payer: 59 | Admitting: Student

## 2023-03-07 VITALS — BP 150/66 | HR 82 | Ht 63.0 in | Wt 181.2 lb

## 2023-03-07 DIAGNOSIS — G8929 Other chronic pain: Secondary | ICD-10-CM

## 2023-03-07 DIAGNOSIS — M25561 Pain in right knee: Secondary | ICD-10-CM

## 2023-03-07 DIAGNOSIS — I10 Essential (primary) hypertension: Secondary | ICD-10-CM | POA: Diagnosis not present

## 2023-03-07 DIAGNOSIS — R82998 Other abnormal findings in urine: Secondary | ICD-10-CM

## 2023-03-07 LAB — POCT URINALYSIS DIP (MANUAL ENTRY)
Bilirubin, UA: NEGATIVE
Blood, UA: NEGATIVE
Glucose, UA: NEGATIVE mg/dL
Nitrite, UA: NEGATIVE
Protein Ur, POC: 30 mg/dL — AB
Spec Grav, UA: 1.02 (ref 1.010–1.025)
Urobilinogen, UA: 0.2 U/dL
pH, UA: 7 (ref 5.0–8.0)

## 2023-03-07 NOTE — Patient Instructions (Addendum)
Ms. Eymard,  Your urine looks like it has some protein in it, but not too terribly much. I'm sending it off for more study, but I don't think we'll need to do too much in the way of intervention.   The Sports Medicine center will call you to set up a knee gel injection.   Eliezer Mccoy, MD

## 2023-03-08 LAB — MICROALBUMIN / CREATININE URINE RATIO
Creatinine, Urine: 159.7 mg/dL
Microalb/Creat Ratio: 37 mg/g{creat} — ABNORMAL HIGH (ref 0–29)
Microalbumin, Urine: 58.3 ug/mL

## 2023-03-09 NOTE — Progress Notes (Signed)
    SUBJECTIVE:   CHIEF COMPLAINT / HPI:   [name] presents unaccompanied today.  Foamy Urine Here at the request of her insurance company after she disclosed foamy urine to a nurse who came out to her home for an assessment.  She tells me that she does have foamy urine on occasion and that this has been going on for quite some time, denies any other urinary symptoms such as dysuria, frequency, urgency.  No suprapubic or flank pain.  R Knee Pain She has a history of a left knee replacement when advanced arthritis in the right knee.  She is wondering if we do hyaluronic acid gel injections here in our clinic.  She says that she has had a history of steroid injections but that the gel injections worked much better for her.  She is adamant that she does not want a "liquid" injection but wants a "gel" injection.  OBJECTIVE:   BP (!) 150/66   Pulse 82   Ht 5\' 3"  (1.6 m)   Wt 181 lb 3.2 oz (82.2 kg)   SpO2 98%   BMI 32.10 kg/m   Gen: Appears younger than stated age  HEENT: normocephalic, atraumatic, EOM grossly intact, oral mucosa moist, neck supple Respiratory: normal respiratory effort GI: non-distended, non-tender MSK: There are obvious arthritic changes to the R knee with bony overgrowth evident over the medial aspect of the joint. She is, however, able to ambulate and move the joint through a full range of motion.   ASSESSMENT/PLAN:   Assessment & Plan Foamy urine Longstanding and intermittent.  Very unlikely to represent a severe issue such as nephrotic range proteinuria.  Point-of-care urine dipstick only with 30 protein.  Would not pursue any aggressive interventions given her advanced age. -Will send urine for microalbumin creatinine ratio Chronic pain of right knee Obvious and advanced osteoarthritis of the right knee.  She has had a knee replacement on the left.  Obviously, due to her age, would not pursue surgical intervention on the right knee but I think if she would  like a gel injection, that this is certainly reasonable neck step to preserve her mobility.  I offered a steroid injection in our clinic which she politely declines. -She has an appointment on 1/27 with sports medicine for a gel injection  Primary hypertension Elevated pressures in clinic. Not on antihypertensives at baseline aside from low dose metoprolol and PRN lasix. Given her advanced age and low-end diastolic pressure, would not treat at this point.      Eliezer Mccoy, MD [facility]

## 2023-03-11 ENCOUNTER — Ambulatory Visit: Payer: 59 | Admitting: Family Medicine

## 2023-03-11 NOTE — Progress Notes (Deleted)
DATE OF VISIT: 03/11/2023        [name] DOB: 05-Sep-1923 MRN: [id number]  CC:  RT knee pain  History- [name] is a 89 y.o.  female for evaluation and treatment of knee pain -referred by PCP office - Dr Marisue Humble to discuss possible HA injections Known OA - hx LT TKA - previously following with [facility], but stopped seeing them due to high co-pay - previous CSI 11/2021, 12/13/16,  - prior HA injections with good response    Past Medical History Past Medical History:  Diagnosis Date   Acid reflux    takes Nexium daily   Anemia    takes Ferrous Sulfate daily   Aortic stenosis    mild AS pk grad 18, mean grad 9, AVA 1.32 cm 03/2015 echo (Dr. Jacinto Halim)   Arthritis    Chronic kidney disease (CKD), stage III (moderate) (HCC)    Diverticulitis    Hematochezia 03/2015   High cholesterol    can't take the meds   History of blood transfusion 03/2015   no abnormal reaction noted   History of GI diverticular bleed    HTN (hypertension)    takes Losartan-HCTZ and Metoprolol daily   Joint pain    Joint swelling    Nocturia    Numbness    in legs   Osteoarthritis of left hip 05/17/2015   Peripheral edema    occasionally   Shortness of breath dyspnea    occasionally and with exertion   Slow urinary stream    at times    Past Surgical History Past Surgical History:  Procedure Laterality Date   ABCESS DRAINAGE     abdominal abcess   cataract surgery Bilateral    CHOLECYSTECTOMY     DILATION AND CURETTAGE OF UTERUS     ESOPHAGOGASTRODUODENOSCOPY N/A 02/23/2015   Procedure: ESOPHAGOGASTRODUODENOSCOPY (EGD);  Surgeon: Carman Ching, MD;  [facility]: [facility] ENDOSCOPY;  Service: Endoscopy;  Laterality: N/A;   FLEXIBLE SIGMOIDOSCOPY N/A 02/24/2015   Procedure: FLEXIBLE SIGMOIDOSCOPY;  Surgeon: Carman Ching, MD;  Location: [facility] ENDOSCOPY;  Service: Endoscopy;  Laterality: N/A;   IR ANGIOGRAM SELECTIVE EACH ADDITIONAL VESSEL  03/06/2017   IR ANGIOGRAM SELECTIVE EACH ADDITIONAL  VESSEL  03/06/2017   IR ANGIOGRAM SELECTIVE EACH ADDITIONAL VESSEL  03/06/2017   IR ANGIOGRAM SELECTIVE EACH ADDITIONAL VESSEL  03/08/2017   IR ANGIOGRAM VISCERAL SELECTIVE  03/06/2017   IR ANGIOGRAM VISCERAL SELECTIVE  03/06/2017   IR ANGIOGRAM VISCERAL SELECTIVE  03/06/2017   IR ANGIOGRAM VISCERAL SELECTIVE  03/08/2017   IR ANGIOGRAM VISCERAL SELECTIVE  03/08/2017   IR ANGIOGRAM VISCERAL SELECTIVE  09/20/2022   IR EMBO ART  VEN HEMORR LYMPH EXTRAV  INC GUIDE ROADMAPPING  03/06/2017   IR EMBO ART  VEN HEMORR LYMPH EXTRAV  INC GUIDE ROADMAPPING  03/08/2017   IR EMBO ART  VEN HEMORR LYMPH EXTRAV  INC GUIDE ROADMAPPING  09/20/2022   IR FLUORO GUIDE CV LINE LEFT  09/20/2022   IR US GUIDE VASC ACCESS LEFT  09/20/2022   IR US GUIDE VASC ACCESS RIGHT  03/06/2017   IR US GUIDE VASC ACCESS RIGHT  03/08/2017   IR US GUIDE VASC ACCESS RIGHT  09/20/2022   left knee surgery     TOTAL HIP ARTHROPLASTY Left 05/17/2015   Procedure: LEFT TOTAL HIP ARTHROPLASTY ANTERIOR APPROACH;  Surgeon: Kathryne Hitch, MD;  Location: [facility] OR;  Service: Orthopedics;  Laterality: Left;    Medications Current Outpatient Medications  Medication Sig Dispense Refill  acetaminophen (TYLENOL) 650 MG CR tablet Take 650 mg by mouth daily.     Alum Hydroxide-Mag Carbonate (GAVISCON PO) Take 1 tablet by mouth daily as needed (acid reflux).     Ascorbic Acid (VITAMIN C) 1000 MG tablet Take 1,000 mg by mouth daily.     Calcium Carbonate Antacid (GAVISCON ACID BRKTHRGH FORMULA PO) Take 1 tablet by mouth as needed. (Patient not taking: Reported on 09/20/2022)     Cholecalciferol (VITAMIN D3) 2000 units TABS Take 2,000 Units by mouth daily. (Patient not taking: Reported on 09/20/2022)     diclofenac sodium (VOLTAREN) 1 % GEL Apply 2 g topically 2 (two) times daily as needed (pain).     famotidine (PEPCID) 20 MG tablet Take 20 mg by mouth 2 (two) times daily. (Patient not taking: Reported on 10/10/2022)     ferrous sulfate 325 (65 FE) MG EC tablet Take 1  tablet (325 mg total) by mouth daily with breakfast. 90 tablet 3   furosemide (LASIX) 20 MG tablet Take 1 tablet (20 mg total) by mouth daily as needed for edema. 90 tablet 0   lidocaine (LIDODERM) 5 % Place 1 patch onto the skin daily. Remove & Discard patch within 12 hours or as directed by MD (Patient not taking: Reported on 10/10/2021) 30 patch 0   metoprolol succinate (TOPROL-XL) 25 MG 24 hr tablet Take 0.5 tablets (12.5 mg total) by mouth daily. 30 tablet 0   Multiple Vitamin (MULITIVITAMIN WITH MINERALS) TABS Take 1 tablet by mouth daily.     nitroGLYCERIN (NITROSTAT) 0.4 MG SL tablet Place 1 tablet (0.4 mg total) under the tongue every 5 (five) minutes as needed for chest pain. 30 tablet 0   polyvinyl alcohol (LIQUIFILM TEARS) 1.4 % ophthalmic solution Place 1 drop into both eyes as needed for dry eyes.     VENTOLIN HFA 108 (90 Base) MCG/ACT inhaler USE 1 TO 2 INHALATIONS BY MOUTH  EVERY 6 HOURS AS NEEDED FOR  WHEEZING OR SHORTNESS OF BREATH 36 g 6   No current facility-administered medications for this visit.    Allergies is allergic to ezetimibe, lipitor [atorvastatin], pravachol [pravastatin], statins, zocor [simvastatin], and cholestyramine.  Family History - reviewed per EMR and intake form  Social History   reports no history of alcohol use.  reports that she has quit smoking. She has been exposed to tobacco smoke. She has never used smokeless tobacco.  reports no history of drug use. OCCUPATION: ***   EXAM: Vitals: There were no vitals taken for this visit. General: AOx3, NAD, pleasant SKIN: no rashes or lesions, skin clean, dry, intact MSK: ***  NEURO: sensation intact to light touch, DTR +***/4 *** bilaterally VASC: pulses 2+ and symmetric *** bilaterally, no edema  IMAGING: XRAYS:  Rt knee XR 2 views 12/13/16 showing: - severe end-stage arthritis of the right knee with joint space narrowing in all 3 compartments with complete  bone-on-bone wear.   - There is  severe osteophytes throughout the knee.  - orthopedist Dr Magnus Ivan commented in his note "This is the worst arthritis is seen in a long time."  Assessment & Plan   ASSESSMENT: 1. ***  PLAN: 1. *** ***. The patient will return to see me ***, will call sooner with any questions/concerns.   ***. Patient expressed understanding & agreement with above.  No diagnosis found.  No orders of the defined types were placed in this encounter.   No orders of the defined types were placed in this encounter.

## 2023-03-19 ENCOUNTER — Encounter: Payer: Self-pay | Admitting: Student

## 2023-03-19 ENCOUNTER — Ambulatory Visit (INDEPENDENT_AMBULATORY_CARE_PROVIDER_SITE_OTHER): Payer: 59 | Admitting: Student

## 2023-03-19 VITALS — BP 127/71 | HR 64 | Ht 63.0 in | Wt 177.4 lb

## 2023-03-19 DIAGNOSIS — Z23 Encounter for immunization: Secondary | ICD-10-CM

## 2023-03-19 DIAGNOSIS — R051 Acute cough: Secondary | ICD-10-CM | POA: Insufficient documentation

## 2023-03-19 DIAGNOSIS — R413 Other amnesia: Secondary | ICD-10-CM | POA: Diagnosis not present

## 2023-03-19 DIAGNOSIS — Z7189 Other specified counseling: Secondary | ICD-10-CM

## 2023-03-19 DIAGNOSIS — I951 Orthostatic hypotension: Secondary | ICD-10-CM | POA: Diagnosis not present

## 2023-03-19 HISTORY — DX: Acute cough: R05.1

## 2023-03-19 HISTORY — DX: Other specified counseling: Z71.89

## 2023-03-19 MED ORDER — ZOSTER VAC RECOMB ADJUVANTED 50 MCG/0.5ML IM SUSR: 0.5000 mL | Freq: Once | INTRAMUSCULAR | 0 refills | Status: AC

## 2023-03-19 NOTE — Patient Instructions (Addendum)
 It was great to see you! Thank you for allowing me to participate in your care!  I recommend that you always bring your medications to each appointment as this makes it easy to ensure you are on the correct medications and helps us  not miss when refills are needed.  Our plans for today:  - If you have a close family member who helps you, please bring them to our next appointment. Bring your medicine.  - go to your apt with sports medicine for a knee injection    Take care and seek immediate care sooner if you develop any concerns.   Dr. Lauraine Molt, DO [facility]

## 2023-03-19 NOTE — Assessment & Plan Note (Signed)
Patient most likely had viral URI with lingering cough.  No cough on exam, she is well-appearing with stable vitals and lungs are clear.  No need for further work up at this time.  Return precautions discussed.

## 2023-03-19 NOTE — Assessment & Plan Note (Signed)
Previously taken off amlodipine.  BP stable today.  Discussed again the importance of changing positions very slowly, squeezing thighs together and clenching fists for a moment before standing up and always making sure she has something to hold onto.

## 2023-03-19 NOTE — Progress Notes (Signed)
    SUBJECTIVE:   CHIEF COMPLAINT / HPI:   R Knee pain Due to OA.  Was scheduled for R knee injection gel inj per her request w/ [facility] on 1/27 but was unaware of apt and no showed. Would like help rescheduling this.   Cough Has had cough and rhinorrhea, unsure if she has had a fever but states she is feeling much better now.  Unable to clearly specify when cough started but thinks maybe it has been a couple weeks.  Notes she is still feeling dizzy upon standing, this is a chronic issue, does have diagnosis of orthostatic hypotension.   PERTINENT  PMH / PSH: HTN, CAD, ortho hypotension, 1st degree AV block, diverticulosis w/ bleed, CKD, aortic stenosis  OBJECTIVE:   BP 127/71   Pulse 64   Ht 5' 3 (1.6 m)   Wt 177 lb 6.4 oz (80.5 kg)   SpO2 100%   BMI 31.42 kg/m    General: NAD, pleasant, able to participate in exam HEENT: white sclera, clear conjunctiva, MMM, no erythema or exudate of oropharynx Cardiac: RRR, systolic murmur present Respiratory: CTAB, normal effort, No wheezes, rales or rhonchi, no cough during exam Skin: warm and dry Neuro: alert, no obvious focal deficits Psych: Normal affect and mood  ASSESSMENT/PLAN:   Orthostatic hypotension Previously taken off amlodipine .  BP stable today.  Discussed again the importance of changing positions very slowly, squeezing thighs together and clenching fists for a moment before standing up and always making sure she has something to hold onto.  Acute cough Patient most likely had viral URI with lingering cough.  No cough on exam, she is well-appearing with stable vitals and lungs are clear.  No need for further work up at this time.  Return precautions discussed.  Memory change I am concerned about memory loss/dementia.  After talking with CMA who help care for patient at last visit, patient was clearly told about her appointment at sports medicine clinic for 1/27 and it was written on her after visit summary which she had with  her.  Appointment scheduled for 2/10 to discuss memory issues, advanced directives.  Patient asked to bring a loved one with her and her medications if possible   Health maintenance: Rx shingles vaccine sent to pharmacy  Dr. Lauraine Molt, DO [facility]

## 2023-03-19 NOTE — Assessment & Plan Note (Signed)
 I am concerned about memory loss/dementia.  After talking with CMA who help care for patient at last visit, patient was clearly told about her appointment at sports medicine clinic for 1/27 and it was written on her after visit summary which she had with her.  Appointment scheduled for 2/10 to discuss memory issues, advanced directives.  Patient asked to bring a loved one with her and her medications if possible

## 2023-03-21 ENCOUNTER — Encounter: Payer: Self-pay | Admitting: Family Medicine

## 2023-03-21 ENCOUNTER — Ambulatory Visit: Payer: 59 | Admitting: Family Medicine

## 2023-03-21 VITALS — BP 162/68 | Ht 63.0 in | Wt 177.0 lb

## 2023-03-21 DIAGNOSIS — I10 Essential (primary) hypertension: Secondary | ICD-10-CM | POA: Diagnosis not present

## 2023-03-21 DIAGNOSIS — M1711 Unilateral primary osteoarthritis, right knee: Secondary | ICD-10-CM | POA: Diagnosis not present

## 2023-03-21 NOTE — Progress Notes (Signed)
 DATE OF VISIT: 03/21/2023    [name] DOB: 11-Jan-1924 MRN: [id number]  CC:  RT knee pain  History- [name] is a 89 y.o.  female for evaluation and treatment of knee pain -referred by PCP office - Dr Marlee to discuss possible HA injections Known OA - hx LT TKA about 30 years ago - previously following with [facility], but stopped seeing them due to high co-pay - previous CSI 11/2021, 12/13/16,  - no HA injections but she is interested in considering this Using Tylenol  and Voltaren  gel prn Walking with a cane  Past Medical History Past Medical History:  Diagnosis Date   Acid reflux    takes Nexium daily   Anemia    takes Ferrous Sulfate  daily   Aortic stenosis    mild AS pk grad 18, mean grad 9, AVA 1.32 cm 03/2015 echo (Dr. Ladona)   Arthritis    Chronic kidney disease (CKD), stage III (moderate) (HCC)    Diverticulitis    Hematochezia 03/2015   High cholesterol    can't take the meds   History of blood transfusion 03/2015   no abnormal reaction noted   History of GI diverticular bleed    HTN (hypertension)    takes Losartan -HCTZ and Metoprolol  daily   Joint pain    Joint swelling    Nocturia    Numbness    in legs   Osteoarthritis of left hip 05/17/2015   Peripheral edema    occasionally   Shortness of breath dyspnea    occasionally and with exertion   Slow urinary stream    at times    Past Surgical History Past Surgical History:  Procedure Laterality Date   ABCESS DRAINAGE     abdominal abcess   cataract surgery Bilateral    CHOLECYSTECTOMY     DILATION AND CURETTAGE OF UTERUS     ESOPHAGOGASTRODUODENOSCOPY N/A 02/23/2015   Procedure: ESOPHAGOGASTRODUODENOSCOPY (EGD);  Surgeon: Lynwood Bohr, MD;  Location: [facility] ENDOSCOPY;  Service: Endoscopy;  Laterality: N/A;   FLEXIBLE SIGMOIDOSCOPY N/A 02/24/2015   Procedure: FLEXIBLE SIGMOIDOSCOPY;  Surgeon: Lynwood Bohr, MD;  Location: [facility] ENDOSCOPY;  Service: Endoscopy;  Laterality: N/A;   IR ANGIOGRAM  SELECTIVE EACH ADDITIONAL VESSEL  03/06/2017   IR ANGIOGRAM SELECTIVE EACH ADDITIONAL VESSEL  03/06/2017   IR ANGIOGRAM SELECTIVE EACH ADDITIONAL VESSEL  03/06/2017   IR ANGIOGRAM SELECTIVE EACH ADDITIONAL VESSEL  03/08/2017   IR ANGIOGRAM VISCERAL SELECTIVE  03/06/2017   IR ANGIOGRAM VISCERAL SELECTIVE  03/06/2017   IR ANGIOGRAM VISCERAL SELECTIVE  03/06/2017   IR ANGIOGRAM VISCERAL SELECTIVE  03/08/2017   IR ANGIOGRAM VISCERAL SELECTIVE  03/08/2017   IR ANGIOGRAM VISCERAL SELECTIVE  09/20/2022   IR EMBO ART  VEN HEMORR LYMPH EXTRAV  INC GUIDE ROADMAPPING  03/06/2017   IR EMBO ART  VEN HEMORR LYMPH EXTRAV  INC GUIDE ROADMAPPING  03/08/2017   IR EMBO ART  VEN HEMORR LYMPH EXTRAV  INC GUIDE ROADMAPPING  09/20/2022   IR FLUORO GUIDE CV LINE LEFT  09/20/2022   IR US  GUIDE VASC ACCESS LEFT  09/20/2022   IR US  GUIDE VASC ACCESS RIGHT  03/06/2017   IR US  GUIDE VASC ACCESS RIGHT  03/08/2017   IR US  GUIDE VASC ACCESS RIGHT  09/20/2022   left knee surgery     TOTAL HIP ARTHROPLASTY Left 05/17/2015   Procedure: LEFT TOTAL HIP ARTHROPLASTY ANTERIOR APPROACH;  Surgeon: Lonni CINDERELLA Poli, MD;  Location: [facility] OR;  Service: Orthopedics;  Laterality: Left;  Medications Current Outpatient Medications  Medication Sig Dispense Refill   acetaminophen  (TYLENOL ) 650 MG CR tablet Take 650 mg by mouth daily.     Alum Hydroxide-Mag Carbonate (GAVISCON PO) Take 1 tablet by mouth daily as needed (acid reflux).     Ascorbic Acid (VITAMIN C) 1000 MG tablet Take 1,000 mg by mouth daily.     Calcium  Carbonate Antacid (GAVISCON ACID BRKTHRGH FORMULA PO) Take 1 tablet by mouth as needed. (Patient not taking: Reported on 09/20/2022)     Cholecalciferol  (VITAMIN D3) 2000 units TABS Take 2,000 Units by mouth daily. (Patient not taking: Reported on 09/20/2022)     diclofenac  sodium (VOLTAREN ) 1 % GEL Apply 2 g topically 2 (two) times daily as needed (pain).     famotidine  (PEPCID ) 20 MG tablet Take 20 mg by mouth 2 (two) times daily. (Patient  not taking: Reported on 10/10/2022)     ferrous sulfate  325 (65 FE) MG EC tablet Take 1 tablet (325 mg total) by mouth daily with breakfast. 90 tablet 3   furosemide  (LASIX ) 20 MG tablet Take 1 tablet (20 mg total) by mouth daily as needed for edema. 90 tablet 0   lidocaine  (LIDODERM ) 5 % Place 1 patch onto the skin daily. Remove & Discard patch within 12 hours or as directed by MD (Patient not taking: Reported on 10/10/2021) 30 patch 0   metoprolol  succinate (TOPROL -XL) 25 MG 24 hr tablet Take 0.5 tablets (12.5 mg total) by mouth daily. 30 tablet 0   Multiple Vitamin (MULITIVITAMIN WITH MINERALS) TABS Take 1 tablet by mouth daily.     nitroGLYCERIN  (NITROSTAT ) 0.4 MG SL tablet Place 1 tablet (0.4 mg total) under the tongue every 5 (five) minutes as needed for chest pain. 30 tablet 0   polyvinyl alcohol (LIQUIFILM TEARS) 1.4 % ophthalmic solution Place 1 drop into both eyes as needed for dry eyes.     VENTOLIN  HFA 108 (90 Base) MCG/ACT inhaler USE 1 TO 2 INHALATIONS BY MOUTH  EVERY 6 HOURS AS NEEDED FOR  WHEEZING OR SHORTNESS OF BREATH 36 g 6   No current facility-administered medications for this visit.    Allergies is allergic to ezetimibe, lipitor [atorvastatin], pravachol [pravastatin], statins, zocor [simvastatin], and cholestyramine.  Family History - reviewed per EMR and intake form  Social History   reports no history of alcohol use.  reports that she has quit smoking. She has been exposed to tobacco smoke. She has never used smokeless tobacco.  reports no history of drug use.  Lives alone   EXAM: Vitals: BP (!) 162/68   Ht 5' 3 (1.6 m)   Wt 177 lb (80.3 kg)   BMI 31.35 kg/m  General: AOx3, NAD, pleasant SKIN: no rashes or lesions, skin clean, dry, intact MSK: KNEE: Right knee with slightly decreased range of motion with 1-2+ patellofemoral crepitus.  No effusion.  Mild medial and lateral joint line tenderness.  She does have some fullness in the popliteal fossa consistent  with small Baker's cyst.  No increased redness or warmth.  No ligamentous laxity. Left knee with good range of motion without pain Ambulating with the assistance of a cane Neurovascularly intact distally  IMAGING: XRAYS:  Rt knee XR 2 views 12/13/16 showing: - severe end-stage arthritis of the right knee with joint space narrowing in all 3 compartments with complete  bone-on-bone wear.   - There is severe osteophytes throughout the knee.  - orthopedist Dr Vernetta commented in his note This is the worst arthritis  is seen in a long time.  Assessment & Plan Primary osteoarthritis of right knee Severe end-stage osteoarthritis of the right knee, limited improvement with prior cortisone injections.  Patient would like to proceed with hyaluronic acid injections.  Plan: -Previous visit notes with PCP reviewed.  Also reviewed notes from orthopedist of available and CareEverywhere. -X-ray report reviewed as noted above showing severe end-stage osteoarthritis -Recent benefits of hyaluronic acid injections reviewed.  Patient would like to proceed.  Will submit to the insurance for authorization.  She will follow-up with us  for ultrasound-guided HA injection once approved -In the interim she can continue her Tylenol  and Voltaren  gel as needed -Should follow-up with PCP as scheduled Essential hypertension Elevated blood pressure in office today.  She is scheduled to see PCP and follow-up early next week.  Should keep this appointment as scheduled.  Continue current regimen as prescribed by PCP  Patient expressed understanding & agreement with above.  Encounter Diagnoses  Name Primary?   Primary osteoarthritis of right knee Yes   Essential hypertension     No orders of the defined types were placed in this encounter.   No orders of the defined types were placed in this encounter.

## 2023-03-21 NOTE — Assessment & Plan Note (Signed)
 Severe end-stage osteoarthritis of the right knee, limited improvement with prior cortisone injections.  Patient would like to proceed with hyaluronic acid injections.  Plan: -Previous visit notes with PCP reviewed.  Also reviewed notes from orthopedist of available and CareEverywhere. -X-ray report reviewed as noted above showing severe end-stage osteoarthritis -Recent benefits of hyaluronic acid injections reviewed.  Patient would like to proceed.  Will submit to the insurance for authorization.  She will follow-up with us  for ultrasound-guided HA injection once approved -In the interim she can continue her Tylenol  and Voltaren  gel as needed -Should follow-up with PCP as scheduled

## 2023-03-21 NOTE — Assessment & Plan Note (Signed)
 Elevated blood pressure in office today.  She is scheduled to see PCP and follow-up early next week.  Should keep this appointment as scheduled.  Continue current regimen as prescribed by PCP

## 2023-03-22 NOTE — Progress Notes (Signed)
 Spoke with pt regarding medication coverage for gel injection. Once deductible is met she is covered at 80%. Unclear whether or not medicaid would pick up remaining expenses as we've gotten conflicting info. Pt would like to hold off at this time.

## 2023-03-25 ENCOUNTER — Ambulatory Visit (INDEPENDENT_AMBULATORY_CARE_PROVIDER_SITE_OTHER): Payer: 59 | Admitting: Student

## 2023-03-25 ENCOUNTER — Encounter: Payer: Self-pay | Admitting: Student

## 2023-03-25 VITALS — BP 156/81 | HR 88 | Ht 63.0 in | Wt 173.6 lb

## 2023-03-25 DIAGNOSIS — Z7189 Other specified counseling: Secondary | ICD-10-CM | POA: Diagnosis not present

## 2023-03-25 NOTE — Progress Notes (Signed)
    SUBJECTIVE:   CHIEF COMPLAINT / HPI:   GOC, concern for memory At last visit, patient had completely forgotten a few things that had been clearly stated on her previous AVS and discussed with her per CMA.  She was asked to return today to assess cognition, GOC, review medications.  She was asked to bring a loved one with her  if possible.  Patient comes in by herself this morning, stating her daughter is currently sick and could not come.  She drove herself here today. She is able to do her own grocery shopping, cook, bathe, pay her pills. Does a little yard work. Has a daughter, granddaughter and nieces who live in [place] who call to check in on her and visit her.  She brought her medications with her which were reviewed in match what is currently in the system. She is interested in completing advance directives but would like to discuss this with her daughter, granddaughter and niece.  PERTINENT  PMH / PSH: HTN, CAD, heart failure, CKD  OBJECTIVE:   BP (!) 156/81   Pulse 88   Ht 5\' 3"  (1.6 m)   Wt 173 lb 9.6 oz (78.7 kg)   SpO2 97%   BMI 30.75 kg/m    General: NAD, pleasant Cardiac: Well-perfused Respiratory: Breathing comfortably on room air Skin: warm and dry Neuro: alert, no obvious focal deficits Psych: Normal affect and mood  Mini-Mental cognition test as below, asked to draw a clock reading 10 past 11  ASSESSMENT/PLAN:   Counseling regarding advance directives and goals of care Patient able to clearly draw a clock with the appropriate time and remembered 2 out of 3 words on mini mental cognition test.  Knows her medications and able to recall what we discussed from a previous visit.  She is clearly able to care for herself at this time and reassuringly has family who checks on her.  She would like to complete the advance directives which we thoroughly reviewed and discussed today. -Patient to discuss advanced directives with her family -If desired, can return  with family members that we can complete this together in the office and have it notarized    Dr. Erick Alley, DO [facility]

## 2023-03-25 NOTE — Patient Instructions (Signed)
 It was great to see you! Thank you for allowing me to participate in your care!  I recommend that you always bring your medications to each appointment as this makes it easy to ensure you are on the correct medications and helps us  not miss when refills are needed.  Our plans for today:  - Please schedule an appointment if you would like me to help you complete the Advanced Directive paperwork. This should be a 40 minute long appointment.    Take care and seek immediate care sooner if you develop any concerns.   Dr. Glenn Lange, DO [facility]

## 2023-03-26 NOTE — Assessment & Plan Note (Signed)
Patient able to clearly draw a clock with the appropriate time and remembered 2 out of 3 words on mini mental cognition test.  Knows her medications and able to recall what we discussed from a previous visit.  She is clearly able to care for herself at this time and reassuringly has family who checks on her.  She would like to complete the advance directives which we thoroughly reviewed and discussed today. -Patient to discuss advanced directives with her family -If desired, can return with family members that we can complete this together in the office and have it notarized

## 2023-04-30 ENCOUNTER — Telehealth: Payer: Self-pay

## 2023-04-30 NOTE — Telephone Encounter (Signed)
 Patient calls nurse line requesting an apt.   She reports she has noticed her mouth tingling and "becoming numb." She reports it occurred for the first time in February, however has become more frequent over the last week.   She denies any difficulty eating or drinking. She denies any difficulty breathing or talking.   She reports the sensation is hard to explain. She reports it is making her anxious.   Patient scheduled for 3/20 for evaluation.   Precautions discussed in the meantime.

## 2023-05-02 ENCOUNTER — Ambulatory Visit (INDEPENDENT_AMBULATORY_CARE_PROVIDER_SITE_OTHER): Admitting: Family Medicine

## 2023-05-02 VITALS — BP 140/70 | Temp 98.0°F | Wt 180.6 lb

## 2023-05-02 DIAGNOSIS — R0981 Nasal congestion: Secondary | ICD-10-CM

## 2023-05-02 MED ORDER — FLUTICASONE PROPIONATE 50 MCG/ACT NA SUSP: 2.0000 | Freq: Every day | NASAL | 6 refills | Status: DC

## 2023-05-02 NOTE — Patient Instructions (Signed)
 It was great to see you today! Thank you for choosing [facility] for your primary care. [name] was seen for dry mouth.  Today we addressed: I think the reason for your weird mouth sensation is due to dryness in your mouth. Sometimes when we sleep with our mouths open, this can cause dryness. I will prescribe a nasal spray for you to use daily to help with congestion.   If you haven't already, sign up for My Chart to have easy access to your labs results, and communication with your primary care physician.  You should return to our clinic for a yearly follow up with Dr. Yetta Barre   I recommend that you always bring your medications to each appointment as this makes it easy to ensure you are on the correct medications and helps [place] not miss refills when you need them.  Please arrive 15 minutes before your appointment to ensure smooth check in process.  We appreciate your efforts in making this happen.  Please call the clinic at [phone] if your symptoms worsen or you have any concerns.  Thank you for allowing me to participate in your care, [name], MD 05/02/2023, 11:07 AM PGY-1, [facility]

## 2023-05-02 NOTE — Progress Notes (Signed)
    SUBJECTIVE:   CHIEF COMPLAINT / HPI:   Patient presents for concern of numbness and tingling in mouth at night.  Reports that since February she has had 3 different instances of waking up at night with her mouth feeling dry and no which is resolved by drinking water.  She denies numbness or tingling anywhere on her face, denies any other numbness except for those 3 individual times.  Patient denies any trouble swallowing.   OBJECTIVE:   BP (!) 142/70   Temp 98 F (36.7 C)   Wt 180 lb 9.6 oz (81.9 kg)   SpO2 96%   BMI 31.99 kg/m   General: A&O, NAD HEENT: No sign of trauma, EOM grossly intact Respiratory: normal WOB GI: non-distended  Extremities: no peripheral edema. Neuro: sensation and motor function intact across face, strength 5/5 in all extremities, normal gait, moves all four extremities appropriately Skin: no lesions/rashes visualized Psych: Appropriate mood and affect   ASSESSMENT/PLAN:   Assessment & Plan Congestion of nasal sinus Suspect patient has some congestion which is causing her to sleep with her mouth open at night. Less concerned for neurologic process given intermittent history of symptoms that resolve with drinking water and normal neurologic exam.  - reassurance provided - flonase for congestion        [name], MD [facility]

## 2023-05-14 ENCOUNTER — Ambulatory Visit: Payer: Self-pay | Admitting: Student

## 2023-05-14 ENCOUNTER — Encounter: Payer: Self-pay | Admitting: Student

## 2023-05-14 VITALS — BP 156/67 | HR 66 | Wt 181.4 lb

## 2023-05-14 DIAGNOSIS — I350 Nonrheumatic aortic (valve) stenosis: Secondary | ICD-10-CM | POA: Diagnosis not present

## 2023-05-14 DIAGNOSIS — Z7189 Other specified counseling: Secondary | ICD-10-CM

## 2023-05-14 DIAGNOSIS — M1711 Unilateral primary osteoarthritis, right knee: Secondary | ICD-10-CM | POA: Diagnosis not present

## 2023-05-14 DIAGNOSIS — K921 Melena: Secondary | ICD-10-CM | POA: Diagnosis not present

## 2023-05-14 DIAGNOSIS — I10 Essential (primary) hypertension: Secondary | ICD-10-CM | POA: Diagnosis not present

## 2023-05-14 NOTE — Assessment & Plan Note (Signed)
 Moderate aortic stenosis on echo from August 2024 with plans to continue monitoring. Last note from cardiology in August 2024 with plans to follow-up in 6 months.  Patient given contact information and advised to schedule appointment in near future

## 2023-05-14 NOTE — Assessment & Plan Note (Signed)
 Chronic hypertension, currently managed with metoprolol 12.5 mg daily. Blood pressure was slightly elevated during the visit, but not concerning enough to alter medication regimen given her age. Preference is to avoid lowering the dose further to prevent falls.

## 2023-05-14 NOTE — Patient Instructions (Addendum)
 It was great to see you! Thank you for allowing me to participate in your care!  I recommend that you always bring your medications to each appointment as this makes it easy to ensure you are on the correct medications and helps [place] not miss when refills are needed.  Our plans for today:  - Continue current medications - Return if you would like to complete the advanced directive paperwork  - Can use voltaren gell 4 times daily for knee pain as well as tylenol 1000 mg every 6 hours as needed - can use lidocaine patches for pain if they help  - return in about 4 months to meet your knew doctor or sooner if needed - call to schedule routine apt with cardiology: Address: [address], [place], [place] [address] Phone: [phone]  We are checking some labs today, I will call you if they are abnormal will send you a MyChart message or a letter if they are normal.  If you do not hear about your labs in the next 2 weeks please let us know.  Take care and seek immediate care sooner if you develop any concerns.   Dr. Erick Alley, DO [facility]

## 2023-05-14 NOTE — Assessment & Plan Note (Signed)
 Discussion of advanced directives with her and her granddaughter. She has not completed the paperwork and wishes to discuss it with family before finalizing.   - Encourage discussion of advanced directives with family  - Offer assistance with completing advanced directives paperwork if desired

## 2023-05-14 NOTE — Progress Notes (Signed)
 SUBJECTIVE:   CHIEF COMPLAINT / HPI:   The patient presents for a general checkup. She has not completed the advanced directives paperwork we discussed at last visit, preferring to discuss it with family first. Her granddaughter is accompanying her to this appointment and states they will discuss it at some point in near future.  The patient's right knee, previously treated with a steroid injection, continues to cause discomfort.  She is still using Tylenol and lidocaine patch for pain.  Stopped using Voltaren gel recently because it is more expensive.   The patient is considering a gel injection for the knee pain as discussed with Dr. Christella Hartigan with sports medicine previously, but is concerned about the cost.  She states she has a friend who sees a specific orthopedic or pain medicine doctor who does the gel injections and she plans to contact him for an appointment.  She is unsure of his name or if she needs a referral but will let me know.  She also reports dark stool, possibly due to iron supplements, but also notes occasional pink water in the toilet which is consistent with her diverticular bleeds which are chronic.  Denies any bright red blood.   PERTINENT  PMH / PSH: CAD, HTN, diverticulosis with IDA  OBJECTIVE:   BP (!) 156/67   Pulse 66   Wt 181 lb 6.4 oz (82.3 kg)   SpO2 99%   BMI 32.13 kg/m    General: NAD, pleasant, able to participate in exam Cardiac: RRR, systolic murmur Respiratory: CTAB, normal effort, No wheezes, rales or rhonchi Extremities: Mild edema of right knee without erythema or warmth, mildly TTP diffusely Skin: warm and dry Neuro: alert, no obvious focal deficits Psych: Normal affect and mood  ASSESSMENT/PLAN:   Essential hypertension Chronic hypertension, currently managed with metoprolol 12.5 mg daily. Blood pressure was slightly elevated during the visit, but not concerning enough to alter medication regimen given her age. Preference is to avoid  lowering the dose further to prevent falls.   Aortic stenosis Moderate aortic stenosis on echo from August 2024 with plans to continue monitoring. Last note from cardiology in August 2024 with plans to follow-up in 6 months.  Patient given contact information and advised to schedule appointment in near future  Counseling regarding advance directives and goals of care Discussion of advanced directives with her and her granddaughter. She has not completed the paperwork and wishes to discuss it with family before finalizing.   - Encourage discussion of advanced directives with family  - Offer assistance with completing advanced directives paperwork if desired   Blood in stool Chronic diverticulosis with gastrointestinal bleeding, previously hospitalized for significant blood loss. Currently on iron supplements to manage anemia. She reports dark stools, likely due to iron supplementation, but also notes occasional pink water in the toilet - Check hemoglobin level to assess for anemia - Continue iron supplementation   Osteoarthritis of right knee Chronic osteoarthritis in the right knee, previously treated with steroid injections without significant relief. She is exploring options for a gel injection, but insurance coverage is a concern. The effectiveness and duration of the gel injection are uncertain, and further discussion with Dr. Christella Hartigan is recommended.  - Follow up with Dr. Christella Hartigan or other desired sports medicine or orthopedic doctor for further management of knee pain and discussion of gel injection options  - Continue using supportive measures such as Voltaren gel, lidocaine patches, tylenol and Biofreeze for pain management      Dr.  Erick Alley, DO [facility]

## 2023-05-14 NOTE — Assessment & Plan Note (Signed)
 Chronic diverticulosis with gastrointestinal bleeding, previously hospitalized for significant blood loss. Currently on iron supplements to manage anemia. She reports dark stools, likely due to iron supplementation, but also notes occasional pink water in the toilet - Check hemoglobin level to assess for anemia - Continue iron supplementation

## 2023-05-14 NOTE — Assessment & Plan Note (Signed)
 Chronic osteoarthritis in the right knee, previously treated with steroid injections without significant relief. She is exploring options for a gel injection, but insurance coverage is a concern. The effectiveness and duration of the gel injection are uncertain, and further discussion with Dr. Christella Hartigan is recommended.  - Follow up with Dr. Christella Hartigan or other desired sports medicine or orthopedic doctor for further management of knee pain and discussion of gel injection options  - Continue using supportive measures such as Voltaren gel, lidocaine patches, tylenol and Biofreeze for pain management

## 2023-05-15 ENCOUNTER — Encounter: Payer: Self-pay | Admitting: Student

## 2023-05-15 LAB — CBC
Hematocrit: 36.3 % (ref 34.0–46.6)
Hemoglobin: 11.8 g/dL (ref 11.1–15.9)
MCH: 30.6 pg (ref 26.6–33.0)
MCHC: 32.5 g/dL (ref 31.5–35.7)
MCV: 94 fL (ref 79–97)
Platelets: 270 10*3/uL (ref 150–450)
RBC: 3.85 x10E6/uL (ref 3.77–5.28)
RDW: 13 % (ref 11.7–15.4)
WBC: 4 10*3/uL (ref 3.4–10.8)

## 2023-07-05 ENCOUNTER — Telehealth: Payer: Self-pay

## 2023-07-05 NOTE — Telephone Encounter (Signed)
 Received VM from RN case manager regarding patient.   Returned call to case manager, she did not answer. LVM asking that she call back to office.   Elsie Halo, RN

## 2023-07-09 ENCOUNTER — Other Ambulatory Visit: Payer: Self-pay

## 2023-07-09 DIAGNOSIS — R0602 Shortness of breath: Secondary | ICD-10-CM

## 2023-07-10 MED ORDER — ALBUTEROL SULFATE HFA 108 (90 BASE) MCG/ACT IN AERS
1.0000 | INHALATION_SPRAY | Freq: Four times a day (QID) | RESPIRATORY_TRACT | 6 refills | Status: AC | PRN
Start: 2023-07-10 — End: ?

## 2023-08-14 ENCOUNTER — Other Ambulatory Visit: Payer: Self-pay | Admitting: Cardiovascular Disease

## 2023-08-19 ENCOUNTER — Encounter (HOSPITAL_COMMUNITY): Payer: Self-pay

## 2023-08-19 ENCOUNTER — Emergency Department (HOSPITAL_COMMUNITY)

## 2023-08-19 ENCOUNTER — Other Ambulatory Visit: Payer: Self-pay

## 2023-08-19 ENCOUNTER — Observation Stay (HOSPITAL_COMMUNITY)
Admission: EM | Admit: 2023-08-19 | Discharge: 2023-08-22 | Disposition: A | Discharge status: Home Health | Attending: Family Medicine | Admitting: Family Medicine

## 2023-08-19 DIAGNOSIS — Z1152 Encounter for screening for COVID-19: Secondary | ICD-10-CM | POA: Insufficient documentation

## 2023-08-19 DIAGNOSIS — Z7189 Other specified counseling: Secondary | ICD-10-CM | POA: Diagnosis not present

## 2023-08-19 DIAGNOSIS — I130 Hypertensive heart and chronic kidney disease with heart failure and stage 1 through stage 4 chronic kidney disease, or unspecified chronic kidney disease: Secondary | ICD-10-CM | POA: Diagnosis not present

## 2023-08-19 DIAGNOSIS — N183 Chronic kidney disease, stage 3 unspecified: Secondary | ICD-10-CM | POA: Diagnosis not present

## 2023-08-19 DIAGNOSIS — K219 Gastro-esophageal reflux disease without esophagitis: Secondary | ICD-10-CM | POA: Diagnosis not present

## 2023-08-19 DIAGNOSIS — I2510 Atherosclerotic heart disease of native coronary artery without angina pectoris: Secondary | ICD-10-CM | POA: Insufficient documentation

## 2023-08-19 DIAGNOSIS — I509 Heart failure, unspecified: Principal | ICD-10-CM

## 2023-08-19 DIAGNOSIS — I5033 Acute on chronic diastolic (congestive) heart failure: Secondary | ICD-10-CM

## 2023-08-19 DIAGNOSIS — R600 Localized edema: Secondary | ICD-10-CM | POA: Diagnosis present

## 2023-08-19 DIAGNOSIS — R0602 Shortness of breath: Secondary | ICD-10-CM | POA: Diagnosis present

## 2023-08-19 DIAGNOSIS — Z515 Encounter for palliative care: Secondary | ICD-10-CM

## 2023-08-19 DIAGNOSIS — N1832 Chronic kidney disease, stage 3b: Secondary | ICD-10-CM | POA: Diagnosis not present

## 2023-08-19 DIAGNOSIS — I350 Nonrheumatic aortic (valve) stenosis: Secondary | ICD-10-CM | POA: Diagnosis not present

## 2023-08-19 DIAGNOSIS — I4729 Other ventricular tachycardia: Secondary | ICD-10-CM | POA: Diagnosis not present

## 2023-08-19 DIAGNOSIS — I10 Essential (primary) hypertension: Secondary | ICD-10-CM | POA: Diagnosis present

## 2023-08-19 HISTORY — DX: Acute on chronic diastolic (congestive) heart failure: I50.33

## 2023-08-19 LAB — BASIC METABOLIC PANEL WITH GFR
Anion gap: 10 (ref 5–15)
BUN: 23 mg/dL (ref 8–23)
CO2: 24 mmol/L (ref 22–32)
Calcium: 9.5 mg/dL (ref 8.9–10.3)
Chloride: 104 mmol/L (ref 98–111)
Creatinine, Ser: 1.22 mg/dL — ABNORMAL HIGH (ref 0.44–1.00)
GFR, Estimated: 40 mL/min — ABNORMAL LOW (ref >60)
Glucose, Bld: 98 mg/dL (ref 70–99)
Potassium: 4.4 mmol/L (ref 3.5–5.1)
Sodium: 138 mmol/L (ref 135–145)

## 2023-08-19 LAB — CBC WITH DIFFERENTIAL/PLATELET
Abs Immature Granulocytes: 0 K/uL (ref 0.00–0.07)
Basophils Absolute: 0 K/uL (ref 0.0–0.1)
Basophils Relative: 0 %
Eosinophils Absolute: 0.1 K/uL (ref 0.0–0.5)
Eosinophils Relative: 2 %
HCT: 35.8 % — ABNORMAL LOW (ref 36.0–46.0)
Hemoglobin: 11.5 g/dL — ABNORMAL LOW (ref 12.0–15.0)
Immature Granulocytes: 0 %
Lymphocytes Relative: 17 %
Lymphs Abs: 0.6 K/uL — ABNORMAL LOW (ref 0.7–4.0)
MCH: 30.5 pg (ref 26.0–34.0)
MCHC: 32.1 g/dL (ref 30.0–36.0)
MCV: 95 fL (ref 80.0–100.0)
Monocytes Absolute: 0.3 K/uL (ref 0.1–1.0)
Monocytes Relative: 8 %
Neutro Abs: 2.6 K/uL (ref 1.7–7.7)
Neutrophils Relative %: 73 %
Platelets: 235 K/uL (ref 150–400)
RBC: 3.77 MIL/uL — ABNORMAL LOW (ref 3.87–5.11)
RDW: 14.6 % (ref 11.5–15.5)
WBC: 3.6 K/uL — ABNORMAL LOW (ref 4.0–10.5)
nRBC: 0 % (ref 0.0–0.2)

## 2023-08-19 LAB — RESP PANEL BY RT-PCR (RSV, FLU A&B, COVID)  RVPGX2
Influenza A by PCR: NEGATIVE
Influenza B by PCR: NEGATIVE
Resp Syncytial Virus by PCR: NEGATIVE
SARS Coronavirus 2 by RT PCR: NEGATIVE

## 2023-08-19 LAB — TROPONIN I (HIGH SENSITIVITY): Troponin I (High Sensitivity): 16 ng/L (ref <18)

## 2023-08-19 LAB — GLUCOSE, CAPILLARY: Glucose-Capillary: 116 mg/dL — ABNORMAL HIGH (ref 70–99)

## 2023-08-19 LAB — BRAIN NATRIURETIC PEPTIDE: B Natriuretic Peptide: 478.3 pg/mL — ABNORMAL HIGH (ref 0.0–100.0)

## 2023-08-19 MED ORDER — ALBUTEROL SULFATE HFA 108 (90 BASE) MCG/ACT IN AERS: 1.0000 | INHALATION_SPRAY | Freq: Four times a day (QID) | RESPIRATORY_TRACT | Status: DC | PRN

## 2023-08-19 MED ORDER — NITROGLYCERIN 0.4 MG SL SUBL: 0.4000 mg | SUBLINGUAL_TABLET | SUBLINGUAL | Status: DC | PRN

## 2023-08-19 MED ORDER — ADULT MULTIVITAMIN W/MINERALS CH
1.0000 | ORAL_TABLET | Freq: Every day | ORAL | Status: DC
  Administered 2023-08-19 – 2023-08-22 (×4): 1 via ORAL
  Filled 2023-08-19 (×4): qty 1

## 2023-08-19 MED ORDER — ONDANSETRON HCL 4 MG/2ML IJ SOLN: 4.0000 mg | Freq: Four times a day (QID) | INTRAMUSCULAR | Status: DC | PRN

## 2023-08-19 MED ORDER — ACETAMINOPHEN 325 MG PO TABS: 650.0000 mg | ORAL_TABLET | Freq: Four times a day (QID) | ORAL | Status: DC | PRN

## 2023-08-19 MED ORDER — FUROSEMIDE 10 MG/ML IJ SOLN
20.0000 mg | Freq: Once | INTRAMUSCULAR | Status: AC
  Administered 2023-08-19: 20 mg via INTRAVENOUS
  Filled 2023-08-19: qty 4

## 2023-08-19 MED ORDER — ALBUTEROL SULFATE (2.5 MG/3ML) 0.083% IN NEBU: 2.5000 mg | INHALATION_SOLUTION | Freq: Four times a day (QID) | RESPIRATORY_TRACT | Status: DC | PRN

## 2023-08-19 MED ORDER — ENOXAPARIN SODIUM 30 MG/0.3ML IJ SOSY
30.0000 mg | PREFILLED_SYRINGE | INTRAMUSCULAR | Status: DC
  Administered 2023-08-19 – 2023-08-21 (×3): 30 mg via SUBCUTANEOUS
  Filled 2023-08-19 (×3): qty 0.3

## 2023-08-19 MED ORDER — ONDANSETRON HCL 4 MG PO TABS: 4.0000 mg | ORAL_TABLET | Freq: Four times a day (QID) | ORAL | Status: DC | PRN

## 2023-08-19 MED ORDER — FUROSEMIDE 20 MG PO TABS
20.0000 mg | ORAL_TABLET | Freq: Every day | ORAL | Status: DC
  Administered 2023-08-19 – 2023-08-22 (×4): 20 mg via ORAL
  Filled 2023-08-19 (×4): qty 1

## 2023-08-19 MED ORDER — FERROUS SULFATE 325 (65 FE) MG PO TABS
325.0000 mg | ORAL_TABLET | Freq: Every day | ORAL | Status: DC
  Administered 2023-08-20 – 2023-08-22 (×3): 325 mg via ORAL
  Filled 2023-08-19 (×4): qty 1

## 2023-08-19 MED ORDER — METOPROLOL SUCCINATE ER 25 MG PO TB24
12.5000 mg | ORAL_TABLET | Freq: Every day | ORAL | Status: DC
  Administered 2023-08-19 – 2023-08-22 (×4): 12.5 mg via ORAL
  Filled 2023-08-19 (×4): qty 1

## 2023-08-19 MED ORDER — PANTOPRAZOLE SODIUM 40 MG PO TBEC
40.0000 mg | DELAYED_RELEASE_TABLET | Freq: Every day | ORAL | Status: DC
  Administered 2023-08-19 – 2023-08-22 (×4): 40 mg via ORAL
  Filled 2023-08-19 (×4): qty 1

## 2023-08-19 MED ORDER — VITAMIN D 25 MCG (1000 UNIT) PO TABS
2000.0000 [IU] | ORAL_TABLET | Freq: Every day | ORAL | Status: DC
  Administered 2023-08-19 – 2023-08-22 (×4): 2000 [IU] via ORAL
  Filled 2023-08-19 (×4): qty 2

## 2023-08-19 NOTE — H&P (Addendum)
 History and Physical    Patient: [name]:[id number] DOB: 1924-02-06 DOA: 08/19/2023 DOS: the patient was seen and examined on 08/19/2023 PCP: Jerrie Gathers, DO  Patient coming from: Home  Chief Complaint:  Chief Complaint  Patient presents with   Leg Swelling   HPI: [name] is a 89 y.o. female with medical history significant of HFpEF, hypertension, stage II 3 CKD, presented with acute on chronic HFpEF.  Patient reports increased work of breathing as well as dyspnea on exertion.  States has been going on for a long time .  Does not fully specify whether this has been over days or weeks.  Baseline history of HFpEF.  Followed by Dr. Robby outpatient.  States she takes Lasix  on a regular basis due to what appears to be every other day.  Also uses Voltaren  gel for osteoarthritis.  No reported high salt intake  No chest pain.  No abdominal pain or diarrhea.  Minimal cough.  Does report worsening lower extremity swelling.  No focal hemiparesis or confusion.  No diarrhea or dysuria.  Does report intermittent black stools in the setting of regular iron use.  No numbness or tingling.  No reported alcohol or tobacco use. Presented to the ER afebrile, hemodynamically stable.  Satting well on room air.  White count 3.6, hemoglobin 11.5, platelets 235, COVID flu and RSV negative.  Creatinine 1.22.  BNP 478.  Troponin is 16.  Chest x-ray within normal limits. Review of Systems: As mentioned in the history of present illness. All other systems reviewed and are negative. Past Medical History:  Diagnosis Date   Acid reflux    takes Nexium daily   Anemia    takes Ferrous Sulfate  daily   Aortic stenosis    mild AS pk grad 18, mean grad 9, AVA 1.32 cm 03/2015 echo (Dr. Ladona)   Arthritis    Chronic kidney disease (CKD), stage III (moderate) (HCC)    Diverticulitis    Hematochezia 03/2015   High cholesterol    can't take the meds   History of blood transfusion 03/2015   no abnormal  reaction noted   History of GI diverticular bleed    HTN (hypertension)    takes Losartan -HCTZ and Metoprolol  daily   Joint pain    Joint swelling    Nocturia    Numbness    in legs   Osteoarthritis of left hip 05/17/2015   Peripheral edema    occasionally   Shortness of breath dyspnea    occasionally and with exertion   Slow urinary stream    at times   Past Surgical History:  Procedure Laterality Date   ABCESS DRAINAGE     abdominal abcess   cataract surgery Bilateral    CHOLECYSTECTOMY     DILATION AND CURETTAGE OF UTERUS     ESOPHAGOGASTRODUODENOSCOPY N/A 02/23/2015   Procedure: ESOPHAGOGASTRODUODENOSCOPY (EGD);  Surgeon: Lynwood Bohr, MD;  Location: [facility] ENDOSCOPY;  Service: Endoscopy;  Laterality: N/A;   FLEXIBLE SIGMOIDOSCOPY N/A 02/24/2015   Procedure: FLEXIBLE SIGMOIDOSCOPY;  Surgeon: Lynwood Bohr, MD;  Location: [facility] ENDOSCOPY;  Service: Endoscopy;  Laterality: N/A;   IR ANGIOGRAM SELECTIVE EACH ADDITIONAL VESSEL  03/06/2017   IR ANGIOGRAM SELECTIVE EACH ADDITIONAL VESSEL  03/06/2017   IR ANGIOGRAM SELECTIVE EACH ADDITIONAL VESSEL  03/06/2017   IR ANGIOGRAM SELECTIVE EACH ADDITIONAL VESSEL  03/08/2017   IR ANGIOGRAM VISCERAL SELECTIVE  03/06/2017   IR ANGIOGRAM VISCERAL SELECTIVE  03/06/2017   IR ANGIOGRAM VISCERAL SELECTIVE  03/06/2017  IR ANGIOGRAM VISCERAL SELECTIVE  03/08/2017   IR ANGIOGRAM VISCERAL SELECTIVE  03/08/2017   IR ANGIOGRAM VISCERAL SELECTIVE  09/20/2022   IR EMBO ART  VEN HEMORR LYMPH EXTRAV  INC GUIDE ROADMAPPING  03/06/2017   IR EMBO ART  VEN HEMORR LYMPH EXTRAV  INC GUIDE ROADMAPPING  03/08/2017   IR EMBO ART  VEN HEMORR LYMPH EXTRAV  INC GUIDE ROADMAPPING  09/20/2022   IR FLUORO GUIDE CV LINE LEFT  09/20/2022   IR US  GUIDE VASC ACCESS LEFT  09/20/2022   IR US  GUIDE VASC ACCESS RIGHT  03/06/2017   IR US  GUIDE VASC ACCESS RIGHT  03/08/2017   IR US  GUIDE VASC ACCESS RIGHT  09/20/2022   left knee surgery     TOTAL HIP ARTHROPLASTY Left 05/17/2015   Procedure: LEFT  TOTAL HIP ARTHROPLASTY ANTERIOR APPROACH;  Surgeon: Lonni CINDERELLA Poli, MD;  Location: [facility] OR;  Service: Orthopedics;  Laterality: Left;   Social History:  reports that she has quit smoking. She has been exposed to tobacco smoke. She has never used smokeless tobacco. She reports that she does not drink alcohol and does not use drugs.  Allergies  Allergen Reactions   Ezetimibe Anaphylaxis    Other reaction(s): anaphylaxis   Lipitor [Atorvastatin] Swelling   Pravachol [Pravastatin] Swelling   Statins Swelling and Other (See Comments)    Swelling of mouth and lips Other reaction(s): anaphylaxis   Zocor [Simvastatin] Swelling   Cholestyramine Nausea And Vomiting    Family History  Problem Relation Age of Onset   Diabetes Other    Hypertension Other    Cancer Other    Heart disease Other     Prior to Admission medications   Medication Sig Start Date End Date Taking? Authorizing Provider  acetaminophen  (TYLENOL ) 500 MG tablet Take 1,000 mg by mouth every 6 (six) hours.   Yes [provider]  acetaminophen  (TYLENOL ) 650 MG CR tablet Take 650 mg by mouth every 6 (six) hours.   Yes [provider]  albuterol  (VENTOLIN  HFA) 108 (90 Base) MCG/ACT inhaler Inhale 1-2 puffs into the lungs every 6 (six) hours as needed for wheezing or shortness of breath. 07/10/23  Yes Joshua Domino, DO  Ascorbic Acid (VITAMIN C) 1000 MG tablet Take 1,000 mg by mouth daily.   Yes [provider]  calcium  carbonate (TUMS EX) 750 MG chewable tablet Chew 1 tablet by mouth as needed for heartburn.   Yes [provider]  Calcium  Carbonate Antacid (GAVISCON ACID BRKTHRGH FORMULA PO) Take 1 tablet by mouth as needed (Indigestion).   Yes [provider]  Cholecalciferol  (VITAMIN D3) 2000 units TABS Take 2,000 Units by mouth daily.   Yes [provider]  diclofenac  sodium (VOLTAREN ) 1 % GEL Apply 2 g topically 2 (two) times daily as needed (pain).   Yes [provider]  ferrous sulfate  325 (65 FE) MG EC tablet Take 1 tablet (325 mg total) by mouth daily with breakfast. 07/20/22  Yes Joshua Domino, DO  furosemide  (LASIX ) 20 MG tablet TAKE 1 TABLET BY MOUTH AS NEEDED FOR EDEMA Patient taking differently: Take 20 mg by mouth every other day. 08/14/23  Yes Nahser, Aleene PARAS, MD  metoprolol  succinate (TOPROL -XL) 25 MG 24 hr tablet Take 0.5 tablets (12.5 mg total) by mouth daily. 09/25/22  Yes Nicholas Bar, MD  Multiple Vitamin (MULITIVITAMIN WITH MINERALS) TABS Take 1 tablet by mouth daily.   Yes [provider]  nitroGLYCERIN  (NITROSTAT ) 0.4 MG SL tablet Place 1  tablet (0.4 mg total) under the tongue every 5 (five) minutes as needed for chest pain. 09/14/22  Yes Joshua Domino, DO  polyvinyl alcohol (LIQUIFILM TEARS) 1.4 % ophthalmic solution Place 1 drop into both eyes as needed for dry eyes.   Yes [provider]    Physical Exam: Vitals:   08/19/23 0915 08/19/23 1305  BP: (!) 156/65 (!) 155/91  Pulse: (!) 59 76  Resp: 16 18  Temp: 98.3 F (36.8 C) 97.7 F (36.5 C)  TempSrc: Oral Oral  SpO2: 98% 99%   Physical Exam Constitutional:      Appearance: She is normal weight.  HENT:     Head: Normocephalic and atraumatic.     Nose: Nose normal.     Mouth/Throat:     Mouth: Mucous membranes are moist.  Eyes:     Pupils: Pupils are equal, round, and reactive to light.  Cardiovascular:     Rate and Rhythm: Normal rate and regular rhythm.  Pulmonary:     Effort: Pulmonary effort is normal.  Abdominal:     General: Bowel sounds are normal.  Musculoskeletal:        General: Normal range of motion.     Right lower leg: Edema present.     Left lower leg: Edema present.  Skin:    General: Skin is warm.  Neurological:     General: No focal deficit present.  Psychiatric:        Mood and Affect: Mood normal.     Data Reviewed:  There are no new results to review at this time.  DG Chest 2 View CLINICAL DATA:  Shortness of  breath.  CHF.  EXAM: CHEST - 2 VIEW  COMPARISON:  05/29/2021.  FINDINGS: Bilateral lung fields are clear. Bilateral costophrenic angles are clear.  Stable cardio-mediastinal silhouette.  No acute osseous abnormalities.  The soft tissues are within normal limits.  IMPRESSION: No active cardiopulmonary disease.  Electronically Signed   By: Ree Molt M.D.   On: 08/19/2023 11:12  Lab Results  Component Value Date   WBC 3.6 (L) 08/19/2023   HGB 11.5 (L) 08/19/2023   HCT 35.8 (L) 08/19/2023   MCV 95.0 08/19/2023   PLT 235 08/19/2023   Last metabolic panel Lab Results  Component Value Date   GLUCOSE 98 08/19/2023   NA 138 08/19/2023   K 4.4 08/19/2023   CL 104 08/19/2023   CO2 24 08/19/2023   BUN 23 08/19/2023   CREATININE 1.22 (H) 08/19/2023   GFRNONAA 40 (L) 08/19/2023   CALCIUM  9.5 08/19/2023   PROT 5.5 (L) 09/20/2022   ALBUMIN 3.0 (L) 09/20/2022   BILITOT 0.4 09/20/2022   ALKPHOS 40 09/20/2022   AST 18 09/20/2022   ALT 13 09/20/2022   ANIONGAP 10 08/19/2023    Assessment and Plan: * Acute on chronic heart failure with preserved ejection fraction (HFpEF) (HCC) 2D ECHO 09/2022 EF 60-65% and grade 3 DD- Followed by Dr. Alveta  Progressive SOB and DOE on presentation- subacute vs. Chronic issue  No hypoxia at present  BNP 478 CXR stable  S/p 20 mg IV lasix  today  Resume home lasix  at 20mg  daily  Weight today 82.3 kg Strict Is and Os and daily weights  Consult cardiology as clinically appropriate     CKD (chronic kidney disease) stage 3, GFR 30-59 ml/min (HCC) Cr 1.22 with GFR 40 today  At baseline  Monitor with diuresis    CAD (coronary artery disease) Baseline CAD  No active  CP in setting of acute on chronic HFpEF  Troponin and EKG stable  Cont home regimen  Follow    Essential hypertension BP stable  Titrate home regimen with diuresis    GERD without esophagitis PPI        Advance Care Planning:   Code Status: Limited: Do  not attempt resuscitation (DNR) -DNR-LIMITED -Do Not Intubate/DNI    Consults: None at present. Cardiology consultation as clinically indicated.   Family Communication: no family present   Severity of Illness: The appropriate patient status for this patient is OBSERVATION. Observation status is judged to be reasonable and necessary in order to provide the required intensity of service to ensure the patient's safety. The patient's presenting symptoms, physical exam findings, and initial radiographic and laboratory data in the context of their medical condition is felt to place them at decreased risk for further clinical deterioration. Furthermore, it is anticipated that the patient will be medically stable for discharge from the hospital within 2 midnights of admission.   Author: Elspeth JINNY Masters, MD 08/19/2023 1:51 PM  For on call review [email or web address].

## 2023-08-19 NOTE — Assessment & Plan Note (Signed)
 PPI ?

## 2023-08-19 NOTE — Consult Note (Signed)
 Palliative Medicine Inpatient Consult Note  Consulting Provider: Dr. Eldonna  Reason for consult:   Palliative Care Consult Services Palliative Medicine Consult  Reason for Consult? goals of care   08/19/2023  HPI:  Per intake H&P --> [name] is a 89 y.o. female with medical history significant of HFpEF, hypertension, stage II 3 CKD, presented with shortness of breath due to heart failure. Palliative care has been asked to support additional goals of care conversations.  Clinical Assessment/Goals of Care:  *Please note that this is a verbal dictation therefore any spelling or grammatical errors are due to the Dragon Medical One system interpretation.  I have reviewed medical records including EPIC notes, labs and imaging, received report from bedside RN, assessed the patient who sitting up in bed in bed in good spirits.    I met with [name] to further discuss diagnosis prognosis, GOC, EOL wishes, disposition and options.   I introduced Palliative Medicine as specialized medical care for people living with serious illness. It focuses on providing relief from the symptoms and stress of a serious illness. The goal is to improve quality of life for both the patient and the family.  Medical History Review and Understanding:  A review (past medical history significant for hypertension, stage III chronic kidney disease, aortic stenosis, osteoarthritis of her hip, and heart failure was completed.  Social History:  [name] is from [place], [place] .  [name] is a widow.  [name] has 1 daughter, [name] who is living.  3 children who are deceased.  [name] has 4 grandchildren.  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  Birth is a woman of faith practicing within the [facility] denomination.  [name] enjoys her independence.  Functional and Nutritional State:  Preceding hospitalization, further had been living in a single-family home.  [name] has been able to attend to her basic activities of  daily living.  [name] does mobilize throughout her home without assistance.  [name] does have a robust appetite.  Palliative Symptoms:  Dyspnea on exertion thought to be related to her heart failure.  This is worsened over the past few weeks.  Advance Directives:  A detailed discussion was had today regarding advanced directives.  [name] does not have advanced directives at this time and [name] is aware that if decisions were needed to be made her daughter, [name] would be her default decision maker.  Code Status:  Concepts specific to code status, artifical feeding and hydration, continued IV antibiotics and rehospitalization was had.  The difference between a aggressive medical intervention path  and a palliative comfort care path for this patient at this time was had.   I confirmed with [name] that if [name] were to go into cardiac arrest that [name] would not want CPR, shocks, intubation, or mechanical ventilatory support.  [name] is an established DO NOT RESUSCITATE DO NOT INTUBATE CODE STATUS.  Discussion:  [name] shares understanding of hospitalization in the setting of her feeling short of breath.  Per the notes [name] has not been hospitalized since August of last year when [name] required intervention for bloody stools.  [name] understands that [name] is being hospitalized for management of her fluid accumulation/heart failure.  Discussions were held in the setting of [name]'s congestive heart failure.  We discussed how this is a chronic progressive disease.  We reviewed the importance of medication adherence, dietary restrictions, fluid restrictions, and healthy lifestyle habits.  I shared despite doing everything right birth it could still be in a situation whereby her heart  failure continues to worsen.  We reviewed when it is end-stage disease-this is often when patients can do very limited activity without profound symptoms such as fatigue and shortness of breath.  [name] shares her goals at this  time are to remain as independent as possible for as long as possible.  [name] has been in her home for over 70 years and would like to stay there for as long as possible.  [name] does note multiple family members have tried to encourage her to move in with them though [name] is not at a point where [name] wants to relinquish her autonomy.  I have encouraged [name] to mobilize as much is able to all hospitalized so [name] does not have decreased strength.  Discussed the importance of continued conversation with family and their  medical providers regarding overall plan of care and treatment options, ensuring decisions are within the context of the patients values and GOCs. ____________________ Addendum: I spoke with patient's daughter [name] about the above conversation.  [name] is aware of her mother's heart failure with. [name] feels that over time her mother's endurance has decreased though [name] does note her mother's strong desire to remain independent in her room wanting to honor this wish as long as possible.  Decision Maker: [name],[name] (Daughter): [phone] (Home Phone)   SUMMARY OF RECOMMENDATIONS   DNAR/DNI  Will complete a MOST form prior to discharge  Discussed the progressive nature of heart failure  Patient's goals are to remain on [name] as long as possible  The palliative care medicine team will remain involved while patient is hospitalized  Code Status/Advance Care Planning: DNAR/DNI   Symptom Management:  Dyspnea: - Heart failure management as per primary team  Palliative Prophylaxis:  Aspiration, Bowel Regimen, Delirium Protocol, Frequent Pain Assessment, Oral Care, Palliative Wound Care, and Turn Reposition  Additional Recommendations (Limitations, Scope, Preferences): Continue current care  Psycho-social/Spiritual:  Desire for further Chaplaincy support: Yes Additional Recommendations: Education on chronic disease burden-heart failure   Prognosis: Difficult to  determine at this time  Discharge Planning: Ideally back home.   Vitals:   08/19/23 1305 08/19/23 1410  BP: (!) 155/91 (!) 157/76  Pulse: 76 79  Resp: 18 16  Temp: 97.7 F (36.5 C) 97.7 F (36.5 C)  SpO2: 99% 100%    Intake/Output Summary (Last 24 hours) at 08/19/2023 1620 Last data filed at 08/19/2023 1500 Gross per 24 hour  Intake --  Output 900 ml  Net -900 ml   Last Weight  Most recent update: 08/19/2023  2:15 PM    Weight  80.8 kg (178 lb 2.1 oz)            Gen: Elderly African-American female HEENT: moist mucous membranes CV: Regular rate and rhythm PULM: On room air breathing is even and unlabored ABD: soft/nontender EXT: BLE edema Neuro: Alert and oriented x3  PPS: 50%   This conversation/these recommendations were discussed with patient primary care team, Dr. Eldonna ______________________________________________________ Rosaline [facility] Palliative Medicine Team Team Cell Phone: [phone] Please utilize secure chat with additional questions, if there is no response within 30 minutes please call the above phone number  Total Time: 75 Billing based on MDM: High  Palliative Medicine Team providers are available by phone from 7am to 7pm daily and can be reached through the team cell phone.  Should this patient require assistance outside of these hours, please call the patient's attending physician.

## 2023-08-19 NOTE — ED Provider Notes (Signed)
 [place] EMERGENCY DEPARTMENT AT [facility] Provider Note   CSN: [id number] Arrival date & time: 08/19/23  9096     Patient presents with: Leg Swelling   [name] is a 89 y.o. female.   HPI   28 female with medical history significant for CHF (last EF 60 to 65% with diastolic dysfunction August 2024), aortic stenosis, CKD stage III, diverticular bleed, presenting to the emergency department with lower extremity edema.  The patient states that she has had chronic edema since her hospitalization last August.  She has been taking half a tablet of Lasix  every day since then.  She endorses worsening dyspnea as well as acute on chronic chest discomfort which she associates with her CHF and worsening lower extremity edema to the point where she felt like she needed to get checked out.  She denies any ripping or tearing chest pain, no fevers or chills.  She endorses a mild nonproductive cough as well.  Prior to Admission medications   Medication Sig Start Date End Date Taking? Authorizing Provider  acetaminophen  (TYLENOL ) 650 MG CR tablet Take 650 mg by mouth daily.    [provider]  albuterol  (VENTOLIN  HFA) 108 (90 Base) MCG/ACT inhaler Inhale 1-2 puffs into the lungs every 6 (six) hours as needed for wheezing or shortness of breath. 07/10/23   Joshua Domino, DO  Alum Hydroxide-Mag Carbonate (GAVISCON PO) Take 1 tablet by mouth daily as needed (acid reflux).    [provider]  Ascorbic Acid (VITAMIN C) 1000 MG tablet Take 1,000 mg by mouth daily.    [provider]  Calcium  Carbonate Antacid (GAVISCON ACID BRKTHRGH FORMULA PO) Take 1 tablet by mouth as needed.    [provider]  Cholecalciferol  (VITAMIN D3) 2000 units TABS Take 2,000 Units by mouth daily.    [provider]  diclofenac  sodium (VOLTAREN ) 1 % GEL Apply 2 g topically 2 (two) times daily as needed (pain).    [provider]  ferrous sulfate  325 (65 FE) MG EC  tablet Take 1 tablet (325 mg total) by mouth daily with breakfast. 07/20/22   Joshua Domino, DO  fluticasone  (FLONASE ) 50 MCG/ACT nasal spray Place 2 sprays into both nostrils daily. 05/02/23   Baloch, Mahnoor, MD  furosemide  (LASIX ) 20 MG tablet TAKE 1 TABLET BY MOUTH AS NEEDED FOR EDEMA 08/14/23   Nahser, Aleene PARAS, MD  metoprolol  succinate (TOPROL -XL) 25 MG 24 hr tablet Take 0.5 tablets (12.5 mg total) by mouth daily. 09/25/22   Nicholas Bar, MD  Multiple Vitamin (MULITIVITAMIN WITH MINERALS) TABS Take 1 tablet by mouth daily.    [provider]  nitroGLYCERIN  (NITROSTAT ) 0.4 MG SL tablet Place 1 tablet (0.4 mg total) under the tongue every 5 (five) minutes as needed for chest pain. 09/14/22   Joshua Domino, DO  polyvinyl alcohol (LIQUIFILM TEARS) 1.4 % ophthalmic solution Place 1 drop into both eyes as needed for dry eyes.    [provider]    Allergies: Ezetimibe, Lipitor [atorvastatin], Pravachol [pravastatin], Statins, Zocor [simvastatin], and Cholestyramine    Review of Systems  Respiratory:  Positive for cough and shortness of breath.   Cardiovascular:  Positive for chest pain and leg swelling.  All other systems reviewed and are negative.   Updated Vital Signs BP (!) 156/65 (BP Location: Left Arm)   Pulse (!) 59   Temp 98.3 F (36.8 C) (Oral)   Resp 16   SpO2 98%   Physical Exam Vitals and nursing  note reviewed.  Constitutional:      General: She is not in acute distress.    Appearance: She is well-developed.  HENT:     Head: Normocephalic and atraumatic.  Eyes:     Conjunctiva/sclera: Conjunctivae normal.  Cardiovascular:     Rate and Rhythm: Normal rate and regular rhythm.  Pulmonary:     Effort: Pulmonary effort is normal. No respiratory distress.     Breath sounds: Rales present.  Abdominal:     Palpations: Abdomen is soft.     Tenderness: There is no abdominal tenderness.  Musculoskeletal:     Cervical back: Neck supple.     Right lower leg: Edema  present.     Left lower leg: Edema present.  Skin:    General: Skin is warm and dry.     Capillary Refill: Capillary refill takes less than 2 seconds.  Neurological:     Mental Status: She is alert.  Psychiatric:        Mood and Affect: Mood normal.     (all labs ordered are listed, but only abnormal results are displayed) Labs Reviewed  BASIC METABOLIC PANEL WITH GFR - Abnormal; Notable for the following components:      Result Value   Creatinine, Ser 1.22 (*)    GFR, Estimated 40 (*)    All other components within normal limits  BRAIN NATRIURETIC PEPTIDE - Abnormal; Notable for the following components:   B Natriuretic Peptide 478.3 (*)    All other components within normal limits  CBC WITH DIFFERENTIAL/PLATELET - Abnormal; Notable for the following components:   WBC 3.6 (*)    RBC 3.77 (*)    Hemoglobin 11.5 (*)    HCT 35.8 (*)    Lymphs Abs 0.6 (*)    All other components within normal limits  RESP PANEL BY RT-PCR (RSV, FLU A&B, COVID)  [id number]  TROPONIN I (HIGH SENSITIVITY)    EKG: None  Radiology: DG Chest 2 View Result Date: 08/19/2023 CLINICAL DATA:  Shortness of breath.  CHF. EXAM: CHEST - 2 VIEW COMPARISON:  05/29/2021. FINDINGS: Bilateral lung fields are clear. Bilateral costophrenic angles are clear. Stable cardio-mediastinal silhouette. No acute osseous abnormalities. The soft tissues are within normal limits. IMPRESSION: No active cardiopulmonary disease. Electronically Signed   By: Ree Molt M.D.   On: 08/19/2023 11:12     Procedures   Medications Ordered in the ED  furosemide  (LASIX ) injection 20 mg (20 mg Intravenous Given 08/19/23 1055)                                    Medical Decision Making Amount and/or Complexity of Data Reviewed Labs: ordered. Radiology: ordered.  Risk Prescription drug management. Decision regarding hospitalization.    76 female with medical history significant for CHF (last EF 60 to 65% with diastolic  dysfunction August 2024), aortic stenosis, CKD stage III, diverticular bleed, presenting to the emergency department with lower extremity edema.  The patient states that she has had chronic edema since her hospitalization last August.  She has been taking half a tablet of Lasix  every day since then.  She endorses worsening dyspnea as well as acute on chronic chest discomfort which she associates with her CHF and worsening lower extremity edema to the point where she felt like she needed to get checked out.  She denies any ripping or tearing chest pain, no fevers or chills.  She endorses a mild nonproductive cough as well.  On arrival, the patient was afebrile, mildly bradycardic heart rate 56, not tachypneic RR 16, BP 156/65, saturating 98% on room air.  Physical exam with mild rales bibasilarly, bilateral edema in the lower extremities.  Patient presenting with worsening symptoms of volume overload.  Denies any active chest pain at this time but does endorse chronic chest discomfort.  Initial EKG revealed atrial fibrillation, rate controlled, ventricular rate 62, no acute ischemic changes.  IV access is obtained and the patient was administered 20 mg of IV Lasix .  Chest x-ray: Clear lungs  Labs: BMP with mildly elevated serum creatinine at 1.22, BNP elevated at 478, cardiac troponin 16, do not think repeat cardiac troponin is indicated given patient chronicity of chest discomfort, CBC without significant anemia, hemoglobin 11.5.  COVID, flu, RSV PCR testing was collected and resulted negative.  Suspect likely CHF exacerbation with mild hypervolemia, given patient age, symptoms, recommended admission for observation and IV diuresis.  Hospitalist medicine consulted for admission, Dr. Eldonna accepting.     Final diagnoses:  Acute congestive heart failure, unspecified heart failure type [facility])    ED Discharge Orders     None          Jerrol Agent, MD 08/19/23 1246

## 2023-08-19 NOTE — Assessment & Plan Note (Signed)
 Cr 1.22 with GFR 40 today  At baseline  Monitor with diuresis

## 2023-08-19 NOTE — Assessment & Plan Note (Signed)
BP stable Titrate home regimen with diuresis

## 2023-08-19 NOTE — Assessment & Plan Note (Addendum)
 2D ECHO 09/2022 EF 60-65% and grade 3 DD- Followed by Dr. Alveta  Progressive SOB and DOE on presentation- subacute vs. Chronic issue  No hypoxia at present  BNP 478 CXR stable  S/p 20 mg IV lasix  today  Resume home lasix  at 20mg  daily  Weight today 82.3 kg Strict Is and Os and daily weights  Consult cardiology as clinically appropriate

## 2023-08-19 NOTE — ED Triage Notes (Signed)
 Pt BIBA from home for Bilateral lower leg edema, dizziness and leg pain.  Per EMS pt states taking Lasik PRN since last dr visit. Denies pain  BP 174/98 97%RA CBG 116 RR 16  Pt is A&O x 4 with some hearing difficulties

## 2023-08-19 NOTE — Assessment & Plan Note (Signed)
 Baseline CAD  No active CP in setting of acute on chronic HFpEF  Troponin and EKG stable  Cont home regimen  Follow

## 2023-08-20 ENCOUNTER — Observation Stay (HOSPITAL_COMMUNITY)

## 2023-08-20 DIAGNOSIS — I5033 Acute on chronic diastolic (congestive) heart failure: Secondary | ICD-10-CM | POA: Diagnosis not present

## 2023-08-20 DIAGNOSIS — I130 Hypertensive heart and chronic kidney disease with heart failure and stage 1 through stage 4 chronic kidney disease, or unspecified chronic kidney disease: Secondary | ICD-10-CM | POA: Diagnosis not present

## 2023-08-20 DIAGNOSIS — N1832 Chronic kidney disease, stage 3b: Secondary | ICD-10-CM | POA: Diagnosis not present

## 2023-08-20 DIAGNOSIS — Z515 Encounter for palliative care: Secondary | ICD-10-CM | POA: Diagnosis not present

## 2023-08-20 DIAGNOSIS — I5031 Acute diastolic (congestive) heart failure: Secondary | ICD-10-CM

## 2023-08-20 DIAGNOSIS — Z1152 Encounter for screening for COVID-19: Secondary | ICD-10-CM | POA: Diagnosis not present

## 2023-08-20 DIAGNOSIS — Z7189 Other specified counseling: Secondary | ICD-10-CM | POA: Diagnosis not present

## 2023-08-20 LAB — ECHOCARDIOGRAM COMPLETE
AR max vel: 1.64 cm2
AV Area VTI: 1.92 cm2
AV Area mean vel: 1.92 cm2
AV Mean grad: 12 mmHg
AV Peak grad: 22.5 mmHg
Ao pk vel: 2.37 m/s
Area-P 1/2: 2.65 cm2
Calc EF: 35.1 %
Height: 66 in
S' Lateral: 3.1 cm
Single Plane A2C EF: 27.8 %
Single Plane A4C EF: 38.9 %
Weight: 2828.94 [oz_av]

## 2023-08-20 LAB — COMPREHENSIVE METABOLIC PANEL WITH GFR
ALT: 13 U/L (ref 0–44)
AST: 20 U/L (ref 15–41)
Albumin: 3.7 g/dL (ref 3.5–5.0)
Alkaline Phosphatase: 52 U/L (ref 38–126)
Anion gap: 11 (ref 5–15)
BUN: 27 mg/dL — ABNORMAL HIGH (ref 8–23)
CO2: 27 mmol/L (ref 22–32)
Calcium: 10 mg/dL (ref 8.9–10.3)
Chloride: 105 mmol/L (ref 98–111)
Creatinine, Ser: 1.32 mg/dL — ABNORMAL HIGH (ref 0.44–1.00)
GFR, Estimated: 37 mL/min — ABNORMAL LOW (ref >60)
Glucose, Bld: 87 mg/dL (ref 70–99)
Potassium: 3.8 mmol/L (ref 3.5–5.1)
Sodium: 143 mmol/L (ref 135–145)
Total Bilirubin: 0.5 mg/dL (ref 0.0–1.2)
Total Protein: 6.8 g/dL (ref 6.5–8.1)

## 2023-08-20 LAB — CBC
HCT: 36.8 % (ref 36.0–46.0)
Hemoglobin: 12.1 g/dL (ref 12.0–15.0)
MCH: 30.9 pg (ref 26.0–34.0)
MCHC: 32.9 g/dL (ref 30.0–36.0)
MCV: 94.1 fL (ref 80.0–100.0)
Platelets: 233 K/uL (ref 150–400)
RBC: 3.91 MIL/uL (ref 3.87–5.11)
RDW: 14.6 % (ref 11.5–15.5)
WBC: 3.2 K/uL — ABNORMAL LOW (ref 4.0–10.5)
nRBC: 0 % (ref 0.0–0.2)

## 2023-08-20 MED ORDER — LOSARTAN POTASSIUM 25 MG PO TABS: 12.5000 mg | ORAL_TABLET | Freq: Every day | ORAL | Status: DC

## 2023-08-20 MED ORDER — BIOTENE DRY MOUTH MT LIQD: 15.0000 mL | OROMUCOSAL | Status: DC | PRN

## 2023-08-20 NOTE — Progress Notes (Signed)
  Echocardiogram 2D Echocardiogram has been performed.  Tinnie FORBES Gosling RDCS 08/20/2023, 10:21 AM

## 2023-08-20 NOTE — Hospital Course (Signed)
 [name] is a 89 y.o. female with a history of HFpEF, CKD stage IIIb, CAD, hypertension.  Patient presented secondary to leg swelling and dyspnea on exertion and found to have evidence of acute heart failure. Lasix  diuresis started. Transthoracic Echocardiogram obtained and is significant for newly reduced LVEF.

## 2023-08-20 NOTE — Progress Notes (Signed)
 OT Cancellation Note  Patient Details Name: [name] MRN: [id number] DOB: 1923-09-19   Cancelled Treatment:    Reason Eval/Treat Not Completed: Other (comment). Social work was present in the room and speaking with the pt.     Delanna LITTIE Molt, OTR/L 08/20/2023, 4:07 PM

## 2023-08-20 NOTE — Evaluation (Signed)
 Occupational Therapy Evaluation Patient Details Name: [name] MRN: [id number] DOB: 1923/08/28 Today's Date: 08/20/2023   History of Present Illness   [name] is a 89 yr old female admitted with  dyspnea on exertion and LE swelling, She was found to have acute on chronic heart failure. PMH: HFpEF, hypertension, stage II 3 CKD     Clinical Impressions The pt performed all assessed tasks with supervision or better, including supine to sit, lower body dressing, sit to stand, and ambulating in her room using a RW. She appears to be near to her baseline level of functioning for self-care management. She does not require further OT services. OT will sign off and recommend she return home at discharge.      If plan is discharge home, recommend the following:   Help with stairs or ramp for entrance     Functional Status Assessment   Patient has not had a recent decline in their functional status     Equipment Recommendations   None recommended by OT     Recommendations for Other Services         Precautions/Restrictions   Restrictions Weight Bearing Restrictions Per Provider Order: No     Mobility Bed Mobility Overal bed mobility: Modified Independent Bed Mobility: Supine to Sit     Supine to sit: Modified independent (Device/Increase time)          Transfers Overall transfer level: Needs assistance Equipment used: Rolling walker (2 wheels) Transfers: Sit to/from Stand Sit to Stand: Supervision                  Balance     Sitting balance-Leahy Scale: Good       Standing balance-Leahy Scale: Fair              ADL either performed or assessed with clinical judgement   ADL Overall ADL's : At baseline;Modified independent;Independent              Vision   Additional Comments: She correctly read the time depicted on the wall clock.            Pertinent Vitals/Pain Pain Assessment Pain Assessment: 0-10 Pain Score: 3   Pain Location: R knee (chronic pain due to arthritis) Pain Intervention(s): Monitored during session     Extremity/Trunk Assessment Upper Extremity Assessment Upper Extremity Assessment: Overall WFL for tasks assessed;Right hand dominant   Lower Extremity Assessment Lower Extremity Assessment: Overall WFL for tasks assessed       Communication Communication Communication: No apparent difficulties   Cognition Arousal: Alert Behavior During Therapy: WFL for tasks assessed/performed          Following commands: Intact                  Home Living Family/patient expects to be discharged to:: Private residence Living Arrangements: Alone Available Help at Discharge: Family;Friend(s);Available PRN/intermittently Type of Home: House Home Access: Stairs to enter [employer] of Steps: 2 front/3 back Entrance Stairs-Rails: Right;Left;Can reach both Home Layout: One level               Home Equipment: Shower seat;Cane - single point;Rolling Walker (2 wheels)          Prior Functioning/Environment Prior Level of Function : Independent/Modified Independent;Driving        Mobility: She used rolling walker or cane for ambulation.       ADLs Comments:  (She was modified independent to independent with ADLs, cooking, cleaning,  and driving.)    OT Problem List: Pain   OT Treatment/Interventions:   No further treatment needs identified      OT Goals(Current goals can be found in the care plan section)   Acute Rehab OT Goals OT Goal Formulation: All assessment and education complete, DC therapy   OT Frequency:   N/A       AM-PAC OT 6 Clicks Daily Activity     Outcome Measure Help from another person eating meals?: None Help from another person taking care of personal grooming?: None Help from another person toileting, which includes using toliet, bedpan, or urinal?: None Help from another person bathing (including washing, rinsing,  drying)?: None Help from another person to put on and taking off regular upper body clothing?: None Help from another person to put on and taking off regular lower body clothing?: None 6 Click Score: 24   End of Session Equipment Utilized During Treatment: Rolling walker (2 wheels) Nurse Communication: Mobility status  Activity Tolerance: Patient tolerated treatment well Patient left: in bed;with call bell/phone within reach  OT Visit Diagnosis: Muscle weakness (generalized) (M62.81)                Time: 1630-1650 OT Time Calculation (min): 20 min Charges:  OT General Charges $OT Visit: 1 Visit OT Evaluation $OT Eval Moderate Complexity: 1 Mod    Pansy Ostrovsky L [employer], OTR/L 08/20/2023, 5:40 PM

## 2023-08-20 NOTE — Progress Notes (Addendum)
 Palliative Medicine Inpatient Follow Up Note HPI: [name] is a 89 y.o. female with medical history significant of HFpEF, hypertension, stage II 3 CKD, presented with shortness of breath due to heart failure. Palliative care has been asked to support additional goals of care conversations.   Today's Discussion 08/20/2023  *Please note that this is a verbal dictation therefore any spelling or grammatical errors are due to the Dragon Medical One system interpretation.  Chart reviewed inclusive of vital signs, progress notes, laboratory results, and diagnostic images.   I met with [name] this morning at bedside. She shares she is feeling imrpoved from a breathing perspective though has dry mouth. We reviewed this is likely in the setting of her diuretic medications and I offered information on how to keep her mouth moist.   Created space and opportunity for patient to explore thoughts feelings and fears regarding current medical situation. She expresses that she wants to get back home. She shares feeling confident that she can continue to provide self care.  We completed a MOST form today. [name] outlined her wishes for the following treatment decisions:  Cardiopulmonary Resuscitation: Do Not Attempt Resuscitation (DNR/No CPR)  Medical Interventions: Limited Additional Interventions: Use medical treatment, IV fluids and cardiac monitoring as indicated, DO NOT USE intubation or mechanical ventilation. May consider use of less invasive airway support such as BiPAP or CPAP. Also provide comfort measures. Transfer to the hospital if indicated. Avoid intensive care.   Antibiotics: Determine use of limitation of antibiotics when infection occurs  IV Fluids: IV fluids for a defined trial period  Feeding Tube: No feeding tube   [name] to keep the MOST form in a visible place in her home.   [name] is clear that if she is unable to function and do for herself she wants to just go  on.   Questions and concerns addressed/Palliative Support Provided.   Objective Assessment: Vital Signs Vitals:   08/19/23 2211 08/20/23 0155  BP: 130/67 (!) 146/62  Pulse: (!) 56 (!) 56  Resp: 14 14  Temp: 97.8 F (36.6 C) 98.3 F (36.8 C)  SpO2: 100% 100%    Intake/Output Summary (Last 24 hours) at 08/20/2023 1111 Last data filed at 08/20/2023 9095 Gross per 24 hour  Intake 120 ml  Output 1500 ml  Net -1380 ml   Last Weight  Most recent update: 08/20/2023  7:44 AM    Weight  80.2 kg (176 lb 12.9 oz)            Gen: Elderly African-American female HEENT: moist mucous membranes CV: Regular rate and rhythm PULM: On room air breathing is even and unlabored ABD: soft/nontender EXT: BLE edema Neuro: Alert and oriented x3  SUMMARY OF RECOMMENDATIONS   DNAR/DNI   MOST Completed, paper copy placed onto the chart electric copy can be found in [employer]   Patient's goals are to remain independent as long as possible  Dry Mouth: - Lip moisturizer as needed - Biotene mouthrinse as needed   The palliative care medicine team will remain involved while patient is hospitalized ______________________________________________________________________________________ [name] Palliative Medicine Team Team Cell Phone: [phone] Please utilize secure chat with additional questions, if there is no response within 30 minutes please call the above phone number  Time Spent: 50 Billing based on MDM: High  Palliative Medicine Team providers are available by phone from 7am to 7pm daily and can be reached through the team cell phone.  Should this patient require  assistance outside of these hours, please call the patient's attending physician.

## 2023-08-20 NOTE — Plan of Care (Signed)
  Problem: Nutrition: Goal: Adequate nutrition will be maintained Outcome: Progressing   Problem: Coping: Goal: Level of anxiety will decrease Outcome: Progressing   Problem: Safety: Goal: Ability to remain free from injury will improve Outcome: Progressing   Problem: Skin Integrity: Goal: Risk for impaired skin integrity will decrease Outcome: Progressing   

## 2023-08-20 NOTE — TOC Initial Note (Signed)
 Transition of Care [facility]) - Initial/Assessment Note    Patient Details  Name: [name] MRN: [id number] Date of Birth: [birth year]  [facility] CM/SW Contact:    Tawni CHRISTELLA [name], LCSW Phone Number: 08/20/2023, 4:52 PM  Clinical Narrative:                  CSW met with the pt at bedside to discuss recommendations for home health services. The pt declined and stated that she does not want anyone coming to her home at this time. CSW explained that if she changes her mind in the future, she can speak with her PCP to arrange services. The pt has no DME needs. She reports that she may need transportation assistance at discharge. TOC to follow.   Expected Discharge Plan: Home/Self Care Barriers to Discharge: Continued Medical Work up   Patient Goals and CMS Choice Patient states their goals for this hospitalization and ongoing recovery are:: return home CMS Medicare.gov Compare Post Acute Care list provided to:: Patient        Expected Discharge Plan and Services       Living arrangements for the past 2 months: Single Family Home                                      Prior Living Arrangements/Services Living arrangements for the past 2 months: Single Family Home Lives with:: Self Patient language and need for interpreter reviewed:: Yes Do you feel safe going back to the place where you live?: Yes      Need for Family Participation in Patient Care: No (Comment) Care giver support system in place?: No (comment)      Activities of Daily Living   ADL Screening (condition at time of admission) Independently performs ADLs?: Yes (appropriate for developmental age) Is the patient deaf or have difficulty hearing?: Yes Does the patient have difficulty seeing, even when wearing glasses/contacts?: No Does the patient have difficulty concentrating, remembering, or making decisions?: No  Permission Sought/Granted                  Emotional  Assessment Appearance:: Appears stated age Attitude/Demeanor/Rapport: Gracious Affect (typically observed): Accepting Orientation: : Oriented to Place, Oriented to  Time, Oriented to Self, Oriented to Situation   Psych Involvement: No (comment)  Admission diagnosis:  Acute congestive heart failure, unspecified heart failure type (HCC) [I50.9] Acute on chronic heart failure with preserved ejection fraction (HFpEF) (HCC) [I50.33] Patient Active Problem List   Diagnosis Date Noted   Acute on chronic heart failure with preserved ejection fraction (HFpEF) (HCC) 08/19/2023   Counseling regarding advance directives and goals of care 03/19/2023   Acute cough 03/19/2023   Dizziness 10/01/2022   Other chronic pain 08/02/2022   Chronic heart failure (HCC) 08/02/2022   Orthostatic hypotension 07/20/2022   Osteoarthritis of right knee 04/10/2022   Blood in stool 04/20/2021   First degree AV block 10/24/2020   Lumbar back pain 06/16/2020   Assistance needed with transportation 06/16/2020   Left hemiparesis (HCC) 09/17/2019   CKD (chronic kidney disease) stage 3, GFR 30-59 ml/min (HCC) 07/21/2018   CAD (coronary artery disease) 01/21/2018   Bradycardia 10/23/2017   Acute lower GI bleeding 03/06/2017   Acute blood loss anemia (ABLA) 03/06/2017   Hiatal hernia 11/28/2016   Aortic stenosis 05/24/2015   GERD without esophagitis 05/23/2015   Status post total replacement of  left hip 05/17/2015   Diverticular bleed 11/13/2012   Essential hypertension 11/13/2012   Diverticulosis 02/23/2012   PCP:  Jerrie Gathers, DO Pharmacy:   [facility] [id number], [place] - [address] [place] [address] Phone: [phone] Fax: [phone]  [facility] - [place], [place] - [address] [place] [address] Phone: [phone] Fax: [phone]  [facility] [address] [place] [address] Phone: [phone] Fax: [phone]  [facility] [address] [place]), [place] - [address] [place] [address] Phone: [phone] Fax: [phone]     Social Drivers of Health (SDOH) Social History: SDOH Screenings   Food Insecurity: No Food Insecurity (08/19/2023)  Housing: Low Risk  (08/19/2023)  Transportation Needs: No Transportation Needs (08/19/2023)  Utilities: Not At Risk (08/19/2023)  Alcohol Screen: Low Risk  (10/10/2022)  Depression (PHQ2-9): Low Risk  (05/14/2023)  Financial Resource Strain: Low Risk  (10/10/2022)  Physical Activity: Inactive (10/10/2022)  Social Connections: Moderately Isolated (08/19/2023)  Stress: No Stress Concern Present (10/10/2022)  Tobacco Use: Medium Risk (08/19/2023)  Health Literacy: Adequate Health Literacy (10/10/2022)   SDOH Interventions:     Readmission Risk Interventions    09/24/2022    2:02 PM  Readmission Risk Prevention Plan  Post Dischage Appt --  Medication Screening Complete  Transportation Screening Complete

## 2023-08-20 NOTE — Progress Notes (Signed)
 PROGRESS NOTE    JAELEN [name]  FMW:[id number] DOB: 1923-10-20 DOA: 08/19/2023 PCP: Jerrie Gathers, DO   Brief Narrative: [name] is a 89 y.o. female with a history of HFpEF, CKD stage IIIb, CAD, hypertension.  Patient presented secondary to leg swelling and dyspnea on exertion and found to have evidence of acute heart failure. Lasix  diuresis started. Transthoracic Echocardiogram obtained and is significant for newly reduced LVEF.   Assessment and Plan:  Acute HFrEF Patient with a history of preserved EF heart failure, followed by Dr. Alveta. Patient is on Toprol  XL and Lasix  (as needed) as an outpatient already. Not on an ACEi/ARB. Transthoracic Echocardiogram this admission with newly reduced LVEF of 45-50^ with global hypokinesis and severe concentric left ventricular hypertrophy. Biatrial enlargement also noted. -Continue Toprol  XL -Continue Lasix  PO -Cardiology consultation since patient is an established patient -Will defer ACEi/ARB to cardiology recommendations  CAD Noted. No chest pain. Troponin negative at 16. -Continue Toprol  XL  CKD stage IIIb Stable.  Primary hypertension -Continue Toprol  XL -Could consider ACEi/ARB therapy, although unsure if may brisk hypotension in this lady; will hold for now  GERD -Continue Protonix     DVT prophylaxis: Lovenox  Code Status:   Code Status: Limited: Do not attempt resuscitation (DNR) -DNR-LIMITED -Do Not Intubate/DNI  Family Communication: None at bedside Disposition Plan: Discharge home likely tomorrow pending cardiology recommendations for discharge   Consultants:  Cardiology  Procedures:  Transthoracic Echocardiogram  Antimicrobials: None    Subjective: Patient reports no issues this morning. Feels better.  Objective: BP (!) 146/62 (BP Location: Left Arm)   Pulse (!) 56   Temp 98.3 F (36.8 C) (Oral)   Resp 14   Ht 5' 6 (1.676 m)   Wt 80.2 kg   SpO2 100%   BMI 28.54 kg/m    Examination:  General exam: Appears calm and comfortable Respiratory system: Clear to auscultation. Respiratory effort normal. Cardiovascular system: S1 & S2 heard, RRR. 2/6 systolic murmur. Gastrointestinal system: Abdomen is nondistended, soft and nontender. Normal bowel sounds heard. Central nervous system: Alert and oriented. No focal neurological deficits. Musculoskeletal: BLE edema. No calf tenderness Psychiatry: Judgement and insight appear normal. Mood & affect appropriate.    Data Reviewed: I have personally reviewed following labs and imaging studies  CBC Lab Results  Component Value Date   WBC 3.2 (L) 08/20/2023   RBC 3.91 08/20/2023   HGB 12.1 08/20/2023   HCT 36.8 08/20/2023   MCV 94.1 08/20/2023   MCH 30.9 08/20/2023   PLT 233 08/20/2023   MCHC 32.9 08/20/2023   RDW 14.6 08/20/2023   LYMPHSABS 0.6 (L) 08/19/2023   MONOABS 0.3 08/19/2023   EOSABS 0.1 08/19/2023   BASOSABS 0.0 08/19/2023     Last metabolic panel Lab Results  Component Value Date   NA 143 08/20/2023   K 3.8 08/20/2023   CL 105 08/20/2023   CO2 27 08/20/2023   BUN 27 (H) 08/20/2023   CREATININE 1.32 (H) 08/20/2023   GLUCOSE 87 08/20/2023   GFRNONAA 37 (L) 08/20/2023   GFRAA 33 (L) 10/14/2019   CALCIUM  10.0 08/20/2023   PROT 6.8 08/20/2023   ALBUMIN 3.7 08/20/2023   BILITOT 0.5 08/20/2023   ALKPHOS 52 08/20/2023   AST 20 08/20/2023   ALT 13 08/20/2023   ANIONGAP 11 08/20/2023    GFR: Estimated Creatinine Clearance: 26 mL/min (A) (by C-G formula based on SCr of 1.32 mg/dL (H)).  Recent Results (from the past 240 hours)  Resp panel  by RT-PCR (RSV, Flu A&B, Covid) Anterior Nasal Swab     Status: None   Collection Time: 08/19/23 11:11 AM   Specimen: Anterior Nasal Swab  Result Value Ref Range Status   SARS Coronavirus 2 by RT PCR NEGATIVE NEGATIVE Final    Comment: (NOTE) SARS-CoV-2 target nucleic acids are NOT DETECTED.  The SARS-CoV-2 RNA is generally detectable in upper  respiratory specimens during the acute phase of infection. The lowest concentration of SARS-CoV-2 viral copies this assay can detect is 138 copies/mL. A negative result does not preclude SARS-Cov-2 infection and should not be used as the sole basis for treatment or other patient management decisions. A negative result may occur with  improper specimen collection/handling, submission of specimen other than nasopharyngeal swab, presence of viral mutation(s) within the areas targeted by this assay, and inadequate number of viral copies(<138 copies/mL). A negative result must be combined with clinical observations, patient history, and epidemiological information. The expected result is Negative.  Fact Sheet for Patients:  BloggerCourse.com  Fact Sheet for Healthcare Providers:  SeriousBroker.it  This test is no t yet approved or cleared by the [place]  FDA and  has been authorized for detection and/or diagnosis of SARS-CoV-2 by FDA under an Emergency Use Authorization (EUA). This EUA will remain  in effect (meaning this test can be used) for the duration of the COVID-19 declaration under Section 564(b)(1) of the Act, 21 U.S.C.section 360bbb-3(b)(1), unless the authorization is terminated  or revoked sooner.       Influenza A by PCR NEGATIVE NEGATIVE Final   Influenza B by PCR NEGATIVE NEGATIVE Final    Comment: (NOTE) The Xpert Xpress SARS-CoV-2/FLU/RSV plus assay is intended as an aid in the diagnosis of influenza from Nasopharyngeal swab specimens and should not be used as a sole basis for treatment. Nasal washings and aspirates are unacceptable for Xpert Xpress SARS-CoV-2/FLU/RSV testing.  Fact Sheet for Patients: BloggerCourse.com  Fact Sheet for Healthcare Providers: SeriousBroker.it  This test is not yet approved or cleared by the [place]  FDA and has been  authorized for detection and/or diagnosis of SARS-CoV-2 by FDA under an Emergency Use Authorization (EUA). This EUA will remain in effect (meaning this test can be used) for the duration of the COVID-19 declaration under Section 564(b)(1) of the Act, 21 U.S.C. section 360bbb-3(b)(1), unless the authorization is terminated or revoked.     Resp Syncytial Virus by PCR NEGATIVE NEGATIVE Final    Comment: (NOTE) Fact Sheet for Patients: BloggerCourse.com  Fact Sheet for Healthcare Providers: SeriousBroker.it  This test is not yet approved or cleared by the [place]  FDA and has been authorized for detection and/or diagnosis of SARS-CoV-2 by FDA under an Emergency Use Authorization (EUA). This EUA will remain in effect (meaning this test can be used) for the duration of the COVID-19 declaration under Section 564(b)(1) of the Act, 21 U.S.C. section 360bbb-3(b)(1), unless the authorization is terminated or revoked.  Performed at [facility], [address], [place], [place] [address]       Radiology Studies: DG Chest 2 View Result Date: 08/19/2023 CLINICAL DATA:  Shortness of breath.  CHF. EXAM: CHEST - 2 VIEW COMPARISON:  05/29/2021. FINDINGS: Bilateral lung fields are clear. Bilateral costophrenic angles are clear. Stable cardio-mediastinal silhouette. No acute osseous abnormalities. The soft tissues are within normal limits. IMPRESSION: No active cardiopulmonary disease. Electronically Signed   By: Ree Molt M.D.   On: 08/19/2023 11:12      LOS: 0 days  Elgin Lam, MD [facility] 08/20/2023, 9:32 AM   If 7PM-7AM, please contact night-coverage [email or web address]

## 2023-08-20 NOTE — Evaluation (Signed)
 Physical Therapy Evaluation Patient Details Name: [name] MRN: [id number] DOB: Jan 03, 1924 Today's Date: 08/20/2023  History of Present Illness  [name] is a 89 y.o. female admitted with  acute on chronic HFpEF. PMH: HFpEF, hypertension, stage II 3 CKD  Clinical Impression  Pt admitted with above diagnosis. Pt reports ind with SPC at home, denies falls, drives or arranges transportation for appointments, reports past success with PT. On eval, pt needing supv-CGA for mobility, slower with SPC and decreased R LE weightbearing as pt reports chronic knee pain. Pt amb out into hallway with RW, more step through gait pattern, decreased cadence, no knee buckling or overt LOB. Recommend HHPT at d/c; pt reports has RW at home and a daughter and granddaughter who can check in PRN, but not stay all day/night. Pt currently with functional limitations due to the deficits listed below (see PT Problem List). Pt will benefit from acute skilled PT to increase their independence and safety with mobility to allow discharge.           If plan is discharge home, recommend the following: A little help with walking and/or transfers;A little help with bathing/dressing/bathroom;Assistance with cooking/housework;Assist for transportation;Help with stairs or ramp for entrance   Can travel by private vehicle        Equipment Recommendations None recommended by PT  Recommendations for Other Services       Functional Status Assessment Patient has had a recent decline in their functional status and demonstrates the ability to make significant improvements in function in a reasonable and predictable amount of time.     Precautions / Restrictions Precautions Precautions: Fall Restrictions Weight Bearing Restrictions Per Provider Order: No      Mobility  Bed Mobility Overal bed mobility: Needs Assistance Bed Mobility: Supine to Sit     Supine to sit: Supervision, Used rails     General bed  mobility comments: increased time and effort to mobilize to EOB, using bedrail to assist trunk    Transfers Overall transfer level: Needs assistance Equipment used: Rolling walker (2 wheels), Straight cane Transfers: Sit to/from Stand Sit to Stand: Contact guard assist           General transfer comment: powers to stand with SPC, slow to power up with slight weight shift to L, provided RW for following reps and pt rises quicker    Ambulation/Gait Ambulation/Gait assistance: Supervision Gait Distance (Feet): 80 Feet Assistive device: Rolling walker (2 wheels), Straight cane Gait Pattern/deviations: Step-through pattern, Decreased stride length Gait velocity: decreased     General Gait Details: step to pattern with R limited stance time while using SPC, provided RW and pt with more step thorugh gait pattern, reports improvement in R knee pain when using RW, increased time with turns  Careers information officer     Tilt Bed    Modified Rankin (Stroke Patients Only)       Balance Overall balance assessment: Needs assistance Sitting-balance support: Feet supported Sitting balance-Leahy Scale: Good     Standing balance support: During functional activity Standing balance-Leahy Scale: Fair Standing balance comment: static without UE support, dynamic using SPC or RW                             Pertinent Vitals/Pain Pain Assessment Pain Assessment: Faces Faces Pain Scale: Hurts a little bit Pain Location: R knee Pain Descriptors /  Indicators: Grimacing, Discomfort Pain Intervention(s): Limited activity within patient's tolerance, Monitored during session, Repositioned    Home Living Family/patient expects to be discharged to:: Private residence Living Arrangements: Alone Available Help at Discharge: Family;Friend(s);Available PRN/intermittently Type of Home: House Home Access: Stairs to enter Entrance Stairs-Rails: Right;Left;Can reach  both Entrance Stairs-Number of Steps: 2 front/3 back   Home Layout: One level Home Equipment: Shower seat;Cane - single Librarian, academic (2 wheels)      Prior Function Prior Level of Function : Independent/Modified Independent;Driving             Mobility Comments: pt reports SPC in the home and community, denies falls ADLs Comments: pt reports ind     Extremity/Trunk Assessment   Upper Extremity Assessment Upper Extremity Assessment: Defer to OT evaluation    Lower Extremity Assessment Lower Extremity Assessment: Overall WFL for tasks assessed    Cervical / Trunk Assessment Cervical / Trunk Assessment: Kyphotic  Communication   Communication Communication: No apparent difficulties    Cognition Arousal: Alert Behavior During Therapy: WFL for tasks assessed/performed   PT - Cognitive impairments: No apparent impairments                         Following commands: Intact       Cueing       General Comments      Exercises     Assessment/Plan    PT Assessment Patient needs continued PT services  PT Problem List Decreased strength;Decreased activity tolerance;Decreased mobility;Decreased balance;Cardiopulmonary status limiting activity;Pain       PT Treatment Interventions DME instruction;Gait training;Stair training;Functional mobility training;Therapeutic activities;Therapeutic exercise;Patient/family education    PT Goals (Current goals can be found in the Care Plan section)  Acute Rehab PT Goals Patient Stated Goal: agreeable to HHPT PT Goal Formulation: With patient Time For Goal Achievement: 09/03/23 Potential to Achieve Goals: Good    Frequency Min 3X/week     Co-evaluation               AM-PAC PT 6 Clicks Mobility  Outcome Measure Help needed turning from your back to your side while in a flat bed without using bedrails?: A Little Help needed moving from lying on your back to sitting on the side of a flat bed  without using bedrails?: A Little Help needed moving to and from a bed to a chair (including a wheelchair)?: A Little Help needed standing up from a chair using your arms (e.g., wheelchair or bedside chair)?: A Little Help needed to walk in hospital room?: A Little Help needed climbing 3-5 steps with a railing? : A Little 6 Click Score: 18    End of Session Equipment Utilized During Treatment: Gait belt Activity Tolerance: Patient tolerated treatment well Patient left: in chair;with call bell/phone within reach Nurse Communication: Mobility status;Other (comment) (no chair alarm in recliner- notified NTs) PT Visit Diagnosis: Other abnormalities of gait and mobility (R26.89)    Time: 8762-8744 PT Time Calculation (min) (ACUTE ONLY): 18 min   Charges:   PT Evaluation $PT Eval Low Complexity: 1 Low   PT General Charges $$ ACUTE PT VISIT: 1 Visit         Tori Druanne Bosques PT, DPT 08/20/23, 1:04 PM

## 2023-08-21 ENCOUNTER — Encounter (HOSPITAL_COMMUNITY): Payer: Self-pay | Admitting: Family Medicine

## 2023-08-21 DIAGNOSIS — Z515 Encounter for palliative care: Secondary | ICD-10-CM | POA: Diagnosis not present

## 2023-08-21 DIAGNOSIS — I5033 Acute on chronic diastolic (congestive) heart failure: Secondary | ICD-10-CM | POA: Diagnosis not present

## 2023-08-21 DIAGNOSIS — Z7189 Other specified counseling: Secondary | ICD-10-CM | POA: Diagnosis not present

## 2023-08-21 LAB — BASIC METABOLIC PANEL WITH GFR
Anion gap: 10 (ref 5–15)
BUN: 29 mg/dL — ABNORMAL HIGH (ref 8–23)
CO2: 26 mmol/L (ref 22–32)
Calcium: 8.9 mg/dL (ref 8.9–10.3)
Chloride: 101 mmol/L (ref 98–111)
Creatinine, Ser: 1.47 mg/dL — ABNORMAL HIGH (ref 0.44–1.00)
GFR, Estimated: 32 mL/min — ABNORMAL LOW (ref >60)
Glucose, Bld: 93 mg/dL (ref 70–99)
Potassium: 3.6 mmol/L (ref 3.5–5.1)
Sodium: 137 mmol/L (ref 135–145)

## 2023-08-21 LAB — MAGNESIUM: Magnesium: 2 mg/dL (ref 1.7–2.4)

## 2023-08-21 MED ORDER — ISOSORBIDE MONONITRATE ER 30 MG PO TB24
30.0000 mg | ORAL_TABLET | Freq: Every day | ORAL | Status: DC
  Administered 2023-08-21 – 2023-08-22 (×2): 30 mg via ORAL
  Filled 2023-08-21 (×2): qty 1

## 2023-08-21 NOTE — Progress Notes (Signed)
   Palliative Medicine Inpatient Follow Up Note HPI: [name] is a 89 y.o. female with medical history significant of HFpEF, hypertension, stage II 3 CKD, presented with shortness of breath due to heart failure. Palliative care has been asked to support additional goals of care conversations.   Today's Discussion 08/21/2023  *Please note that this is a verbal dictation therefore any spelling or grammatical errors are due to the [employer] One system interpretation.  Chart reviewed inclusive of vital signs, progress notes, laboratory results, and diagnostic images.   I met with [name] this morning at bedside, she is resting peacefully and is in NAD. She shares that she is hopeful to get home sooner than later.   [name] shares her dry mouth has improved.   [name] feels her SOB has gotten somewhat better though it is still noted with movement.   Education was provided again on th chronic progressive nature of heart failure in accordance with the NYHA classification system. Discussed the importance of medication management and lifestyle modifications. We reviewed that cardiology could further guide her.  Questions and concerns addressed/Palliative Support Provided.   Objective Assessment: Vital Signs Vitals:   08/21/23 0700 08/21/23 0816  BP:  (!) 153/67  Pulse:  (!) 58  Resp: 12   Temp:    SpO2:      Intake/Output Summary (Last 24 hours) at 08/21/2023 0909 Last data filed at 08/21/2023 0800 Gross per 24 hour  Intake --  Output 800 ml  Net -800 ml   Last Weight  Most recent update: 08/20/2023  7:44 AM    Weight  80.2 kg (176 lb 12.9 oz)            Gen: Elderly African-American female HEENT: moist mucous membranes CV: Regular rate and rhythm PULM: On room air breathing is even and unlabored ABD: soft/nontender EXT: BLE edema Neuro: Alert and oriented x3  SUMMARY OF RECOMMENDATIONS   DNAR/DNI   MOST Completed, paper copy placed onto the chart electric copy can  be found in [facility]   Patient's goals are to remain independent as long as possible  Dry Mouth: - Lip moisturizer as needed - Biotene mouthrinse as needed   The palliative care medicine team will remain involved while patient is hospitalized ______________________________________________________________________________________ [name] Palliative Medicine Team Team Cell Phone: [phone] Please utilize secure chat with additional questions, if there is no response within 30 minutes please call the above phone number  Billing based on MDM: Moderate   Palliative Medicine Team providers are available by phone from 7am to 7pm daily and can be reached through the team cell phone.  Should this patient require assistance outside of these hours, please call the patient's attending physician.

## 2023-08-21 NOTE — Plan of Care (Signed)
   Problem: Coping: Goal: Level of anxiety will decrease Outcome: Progressing   Problem: Elimination: Goal: Will not experience complications related to bowel motility Outcome: Progressing   Problem: Safety: Goal: Ability to remain free from injury will improve Outcome: Progressing

## 2023-08-21 NOTE — Consult Note (Addendum)
 Cardiology Consultation   Patient ID: [name] MRN: [id number]; DOB: [birth year]  Admit date: 08/19/2023 Date of Consult: 08/21/2023  PCP:  Jerrie Gathers, DO    [facility]Care Providers Cardiologist:  Previously a patient of Dr. Alveta  Patient Profile: [name] is a 89 y.o. female with a hx of chronic HFpEF, HTN, CKD, mild AS, history of GI bleed, minimal CAD per cath in 2003, brief NSVT episodes noted on monitor  who is being seen 08/21/2023 for the evaluation of acute on chronic HFpEF with newly moderately reduced EF at the request of Dr. Eldonna.  History of Present Illness: [name] has past medical history as stated above. She presented to [facility] ED on 08/19/2023 complaining of leg swelling. She reports that she has been experiencing chronic edema since being in the hospital August 2024 and has been taking PO Lasix  since then. She states that she has had worsening DOE and acute on chronic chest discomfort along with her leg swelling.   Relevant workup in the ED/since admission: BMP shows stable CKD, BUN beginning to rise, CBC stable, troponin negative, BNP 478, CXR showed no active disease, echocardiogram showed LVEF 45-50%, severe LVH, normal RV, BAE, mild MR, mild AS, dilated IVC. When compared to echo from 09/2022 where EF was 60-65%, mild LVH, G3DD, otherwise fairly similar. EKG showed sinus bradycardia with 1st degree AVB and LBBB, both noted on prior EKGs, HR 57.  She was given IV Lasix  20 mg x 1 dose then switched back to PO Lasix . She is currently receiving PO Lasix  20 mg daily, and Toprol  12.5 mg daily. Her home regimen was Toprol  12.5 mg daily, PRN PO Lasix  20 mg for edema.   She was last seen by Dr. Alveta as an outpatient 09/2022. At this appointment she was being seen for AS and HTN. During this appointment she reported that she has good days and bad days. She had recently worn a monitor that showed brief episodes of NSVT, therefore Toprol  was initiated at  this appointment.   After speaking with this patient, she agrees with the history as stated above. She reports that she was taking her PO Lasix  at home every other day at some point but then was told to take it as needed. She was taking it daily the last couple of days because she felt the LE edema gradually increasing. She reports that her LE edema has improved since admission but she is still feeling lightheaded and short of breath with exertion. She lives alone and is hoping to go home by herself without much help so she wants to ensure that she is fully recovered prior to discharge.   Past Medical History:  Diagnosis Date   Anemia    takes Ferrous Sulfate  daily   Aortic stenosis    mild AS pk grad 18, mean grad 9, AVA 1.32 cm 03/2015 echo (Dr. Ladona)   Arthritis    Chronic kidney disease (CKD), stage III (moderate) (HCC)    Diverticulitis    Hematochezia 03/2015   High cholesterol    can't take the meds   History of blood transfusion 03/2015   no abnormal reaction noted   History of GI diverticular bleed    HTN (hypertension)    takes Losartan -HCTZ and Metoprolol  daily   Osteoarthritis of left hip 05/17/2015   Past Surgical History:  Procedure Laterality Date   ABCESS DRAINAGE     abdominal abcess   cataract surgery Bilateral    CHOLECYSTECTOMY  DILATION AND CURETTAGE OF UTERUS     ESOPHAGOGASTRODUODENOSCOPY N/A 02/23/2015   Procedure: ESOPHAGOGASTRODUODENOSCOPY (EGD);  Surgeon: Lynwood Bohr, MD;  Location: [facility] ENDOSCOPY;  Service: Endoscopy;  Laterality: N/A;   FLEXIBLE SIGMOIDOSCOPY N/A 02/24/2015   Procedure: FLEXIBLE SIGMOIDOSCOPY;  Surgeon: Lynwood Bohr, MD;  Location: [facility] ENDOSCOPY;  Service: Endoscopy;  Laterality: N/A;   IR ANGIOGRAM SELECTIVE EACH ADDITIONAL VESSEL  03/06/2017   IR ANGIOGRAM SELECTIVE EACH ADDITIONAL VESSEL  03/06/2017   IR ANGIOGRAM SELECTIVE EACH ADDITIONAL VESSEL  03/06/2017   IR ANGIOGRAM SELECTIVE EACH ADDITIONAL VESSEL  03/08/2017   IR ANGIOGRAM  VISCERAL SELECTIVE  03/06/2017   IR ANGIOGRAM VISCERAL SELECTIVE  03/06/2017   IR ANGIOGRAM VISCERAL SELECTIVE  03/06/2017   IR ANGIOGRAM VISCERAL SELECTIVE  03/08/2017   IR ANGIOGRAM VISCERAL SELECTIVE  03/08/2017   IR ANGIOGRAM VISCERAL SELECTIVE  09/20/2022   IR EMBO ART  VEN HEMORR LYMPH EXTRAV  INC GUIDE ROADMAPPING  03/06/2017   IR EMBO ART  VEN HEMORR LYMPH EXTRAV  INC GUIDE ROADMAPPING  03/08/2017   IR EMBO ART  VEN HEMORR LYMPH EXTRAV  INC GUIDE ROADMAPPING  09/20/2022   IR FLUORO GUIDE CV LINE LEFT  09/20/2022   IR US  GUIDE VASC ACCESS LEFT  09/20/2022   IR US  GUIDE VASC ACCESS RIGHT  03/06/2017   IR US  GUIDE VASC ACCESS RIGHT  03/08/2017   IR US  GUIDE VASC ACCESS RIGHT  09/20/2022   left knee surgery     TOTAL HIP ARTHROPLASTY Left 05/17/2015   Procedure: LEFT TOTAL HIP ARTHROPLASTY ANTERIOR APPROACH;  Surgeon: Lonni CINDERELLA Poli, MD;  Location: [facility] OR;  Service: Orthopedics;  Laterality: Left;    Home Medications:  Prior to Admission medications   Medication Sig Start Date End Date Taking? Authorizing Provider  acetaminophen  (TYLENOL ) 500 MG tablet Take 1,000 mg by mouth every 6 (six) hours.   Yes [provider]  acetaminophen  (TYLENOL ) 650 MG CR tablet Take 650 mg by mouth every 6 (six) hours.   Yes [provider]  albuterol  (VENTOLIN  HFA) 108 (90 Base) MCG/ACT inhaler Inhale 1-2 puffs into the lungs every 6 (six) hours as needed for wheezing or shortness of breath. 07/10/23  Yes Joshua Domino, DO  Ascorbic Acid (VITAMIN C) 1000 MG tablet Take 1,000 mg by mouth daily.   Yes [provider]  calcium  carbonate (TUMS EX) 750 MG chewable tablet Chew 1 tablet by mouth as needed for heartburn.   Yes [provider]  Calcium  Carbonate Antacid (GAVISCON ACID BRKTHRGH FORMULA PO) Take 1 tablet by mouth as needed (Indigestion).   Yes [provider]  Cholecalciferol  (VITAMIN D3) 2000 units TABS Take 2,000 Units by mouth daily.   Yes [provider]   diclofenac  sodium (VOLTAREN ) 1 % GEL Apply 2 g topically 2 (two) times daily as needed (pain).   Yes [provider]  ferrous sulfate  325 (65 FE) MG EC tablet Take 1 tablet (325 mg total) by mouth daily with breakfast. 07/20/22  Yes Joshua Domino, DO  furosemide  (LASIX ) 20 MG tablet TAKE 1 TABLET BY MOUTH AS NEEDED FOR EDEMA Patient taking differently: Take 20 mg by mouth every other day. 08/14/23  Yes Nahser, Aleene PARAS, MD  metoprolol  succinate (TOPROL -XL) 25 MG 24 hr tablet Take 0.5 tablets (12.5 mg total) by mouth daily. 09/25/22  Yes Nicholas Bar, MD  Multiple Vitamin (MULITIVITAMIN WITH MINERALS) TABS Take 1 tablet by mouth daily.   Yes [provider]  nitroGLYCERIN  (NITROSTAT ) 0.4 MG SL  tablet Place 1 tablet (0.4 mg total) under the tongue every 5 (five) minutes as needed for chest pain. 09/14/22  Yes Joshua Domino, DO  polyvinyl alcohol (LIQUIFILM TEARS) 1.4 % ophthalmic solution Place 1 drop into both eyes as needed for dry eyes.   Yes [provider]    Scheduled Meds:  cholecalciferol   2,000 Units Oral Daily   enoxaparin  (LOVENOX ) injection  30 mg Subcutaneous Q24H   ferrous sulfate   325 mg Oral Q breakfast   furosemide   20 mg Oral Daily   metoprolol  succinate  12.5 mg Oral Daily   multivitamin with minerals  1 tablet Oral Daily   pantoprazole   40 mg Oral Daily   Continuous Infusions:  PRN Meds: acetaminophen , albuterol , antiseptic oral rinse, nitroGLYCERIN , ondansetron  **OR** ondansetron  (ZOFRAN ) IV  Allergies:    Allergies  Allergen Reactions   Ezetimibe Anaphylaxis    Other reaction(s): anaphylaxis   Lipitor [Atorvastatin] Swelling   Pravachol [Pravastatin] Swelling   Statins Swelling and Other (See Comments)    Swelling of mouth and lips Other reaction(s): anaphylaxis   Zocor [Simvastatin] Swelling   Cholestyramine Nausea And Vomiting   Social History:   Social History   Socioeconomic History   Marital status: Widowed    Spouse name: Not  on file   Number of children: Not on file   Years of education: Not on file   Highest education level: Not on file  Occupational History   Not on file  Tobacco Use   Smoking status: Former    Passive exposure: Past   Smokeless tobacco: Never   Tobacco comments:    quit smoking 8+ yrs ago  Vaping Use   Vaping status: Never Used  Substance and Sexual Activity   Alcohol use: No    Comment: quit yrs ago   Drug use: No   Sexual activity: Not Currently  Other Topics Concern   Not on file  Social History Narrative   Not on file   Social Drivers of Health   Financial Resource Strain: Low Risk  (10/10/2022)   Overall Financial Resource Strain (CARDIA)    Difficulty of Paying Living Expenses: Not very hard  Food Insecurity: No Food Insecurity (08/19/2023)   Hunger Vital Sign    Worried About Running Out of Food in the Last Year: Never true    Ran Out of Food in the Last Year: Never true  Transportation Needs: No Transportation Needs (08/19/2023)   PRAPARE - Administrator, Civil Service (Medical): No    Lack of Transportation (Non-Medical): No  Physical Activity: Inactive (10/10/2022)   Exercise Vital Sign    Days of Exercise per Week: 0 days    Minutes of Exercise per Session: 0 min  Stress: No Stress Concern Present (10/10/2022)   [employer] of Occupational Health - Occupational Stress Questionnaire    Feeling of Stress : Not at all  Social Connections: Moderately Isolated (08/19/2023)   Social Connection and Isolation Panel    Frequency of Communication with Friends and Family: More than three times a week    Frequency of Social Gatherings with Friends and Family: Twice a week    Attends Religious Services: 1 to 4 times per year    Active Member of [employer] or Organizations: No    Attends Banker Meetings: Never    Marital Status: Widowed  Intimate Partner Violence: Not At Risk (08/19/2023)   Humiliation, Afraid, Rape, and Kick questionnaire    Fear of  Current or Ex-Partner: No    Emotionally Abused: No    Physically Abused: No    Sexually Abused: No    Family History:   Family History  Problem Relation Age of Onset   Diabetes Other    Hypertension Other    Cancer Other    Heart disease Other     ROS:  Please see the history of present illness.  All other ROS reviewed and negative.     Physical Exam/Data: Vitals:   08/21/23 0445 08/21/23 0700 08/21/23 0800 08/21/23 0816  BP: 127/63   (!) 153/67  Pulse: (!) 49   (!) 58  Resp: 15 12 15    Temp: 98.5 F (36.9 C)     TempSrc: Oral     SpO2: 100%     Weight:      Height:        Intake/Output Summary (Last 24 hours) at 08/21/2023 1409 Last data filed at 08/21/2023 1300 Gross per 24 hour  Intake --  Output 1200 ml  Net -1200 ml      08/20/2023    7:16 AM 08/19/2023    2:10 PM 05/14/2023    1:25 PM  Last 3 Weights  Weight (lbs) 176 lb 12.9 oz 178 lb 2.1 oz 181 lb 6.4 oz  Weight (kg) 80.2 kg 80.8 kg 82.283 kg     Body mass index is 28.54 kg/m.   General:  Well nourished, well developed, in no acute distress, on room air HEENT: normal Neck: no JVD Vascular: Distal pulses 2+ bilaterally Cardiac:  normal S1, S2; regular rhythm, bradycardic; soft systolic murmur  Lungs:  decreased breath sounds at bases   Abd: soft, nontender, no hepatomegaly  Ext: no edema Musculoskeletal:  No deformities, BUE and BLE strength normal and equal Skin: warm and dry  Neuro:  no focal abnormalities noted Psych:  Normal affect   EKG:  The EKG was personally reviewed and demonstrates:  sinus rhythm with known 1st degree AVB, LBBB, HR 57   Telemetry:  Telemetry was personally reviewed and demonstrates:  sinus bradycardia with 1st degree AVB  Relevant CV Studies:  Echocardiogram, 08/20/2023 Left ventricular ejection fraction, by estimation, is 45 to 50% . The left ventricle has mildly decreased function. The left ventricle demonstrates global hypokinesis. There is severe concentric left  ventricular hypertrophy. Left ventricular diastolic function could not be evaluated.  Right ventricular systolic function is normal. The right ventricular size is normal.  Left atrial size was severely dilated.  Right atrial size was severely dilated.  The mitral valve is degenerative. Mild mitral valve regurgitation. The mean mitral valve gradient is 2. 0 mmHg with average heart rate of 52 bpm. Moderate mitral annular calcification.  The aortic valve was not well visualized. Aortic valve regurgitation is not visualized. Mild aortic valve stenosis. Aortic valve area, by VTI measures 1. 92 cm . Aortic valve Vmax measures 2. 37 m/ s. The inferior vena cava is dilated in size with < 50% respiratory variability, suggesting right atrial pressure of 15 mmHg.  Comparison(s) : Prior images reviewed side by side. Function is less vigorous from last study; aortic regurgitation not seen in this study.  Laboratory Data: High Sensitivity Troponin:   Recent Labs  Lab 08/19/23 0955  TROPONINIHS 16     Chemistry Recent Labs  Lab 08/19/23 0955 08/20/23 0418 08/21/23 0423  NA 138 143 137  K 4.4 3.8 3.6  CL 104 105 101  CO2 24 27 26   GLUCOSE 98 87  93  BUN 23 27* 29*  CREATININE 1.22* 1.32* 1.47*  CALCIUM  9.5 10.0 8.9  GFRNONAA 40* 37* 32*  ANIONGAP 10 11 10     Recent Labs  Lab 08/20/23 0418  PROT 6.8  ALBUMIN 3.7  AST 20  ALT 13  ALKPHOS 52  BILITOT 0.5   Lipids No results for input(s): CHOL, TRIG, HDL, LABVLDL, LDLCALC, CHOLHDL in the last 168 hours.  Hematology Recent Labs  Lab 08/19/23 0955 08/20/23 0418  WBC 3.6* 3.2*  RBC 3.77* 3.91  HGB 11.5* 12.1  HCT 35.8* 36.8  MCV 95.0 94.1  MCH 30.5 30.9  MCHC 32.1 32.9  RDW 14.6 14.6  PLT 235 233   Thyroid No results for input(s): TSH, FREET4 in the last 168 hours.  BNP Recent Labs  Lab 08/19/23 0955  BNP 478.3*    DDimer No results for input(s): DDIMER in the last 168 hours.  Radiology/Studies:   ECHOCARDIOGRAM COMPLETE Result Date: 08/20/2023    ECHOCARDIOGRAM REPORT   Patient Name:   AZAYA GOEDDE Date of Exam: 08/20/2023 Medical Rec #:  [id number]      Height:       66.0 in Accession #:    [id number]     Weight:       176.8 lb Date of Birth:  July 20, 1923      BSA:          1.898 m Patient Age:    89 years       BP:           146/62 mmHg Patient Gender: F              HR:           54 bpm. Exam Location:  Inpatient Procedure: 2D Echo, Color Doppler and Cardiac Doppler (Both Spectral and Color            Flow Doppler were utilized during procedure). Indications:    CHF Acute Diastolic I50.31  History:        Patient has prior history of Echocardiogram examinations, most                 recent 09/18/2019.  Sonographer:    Tinnie Gosling RDCS Referring Phys: [phone] STEVEN J NEWTON IMPRESSIONS  1. Left ventricular ejection fraction, by estimation, is 45 to 50%. The left ventricle has mildly decreased function. The left ventricle demonstrates global hypokinesis. There is severe concentric left ventricular hypertrophy. Left ventricular diastolic  function could not be evaluated.  2. Right ventricular systolic function is normal. The right ventricular size is normal.  3. Left atrial size was severely dilated.  4. Right atrial size was severely dilated.  5. The mitral valve is degenerative. Mild mitral valve regurgitation. The mean mitral valve gradient is 2.0 mmHg with average heart rate of 52 bpm. Moderate mitral annular calcification.  6. The aortic valve was not well visualized. Aortic valve regurgitation is not visualized. Mild aortic valve stenosis. Aortic valve area, by VTI measures 1.92 cm. Aortic valve Vmax measures 2.37 m/s.  7. The inferior vena cava is dilated in size with <50% respiratory variability, suggesting right atrial pressure of 15 mmHg. Comparison(s): Prior images reviewed side by side. Function is less vigorous from last study; aortic regurgitation not seen in this study. FINDINGS  Left  Ventricle: Left ventricular ejection fraction, by estimation, is 45 to 50%. The left ventricle has mildly decreased function. The left ventricle demonstrates global hypokinesis. The left ventricular internal cavity size was  small. There is severe concentric left ventricular hypertrophy. Abnormal (paradoxical) septal motion, consistent with left bundle branch block. Left ventricular diastolic function could not be evaluated due to mitral annular calcification (moderate or greater). Left ventricular diastolic function could not be evaluated. Right Ventricle: The right ventricular size is normal. No increase in right ventricular wall thickness. Right ventricular systolic function is normal. Left Atrium: Left atrial size was severely dilated. Right Atrium: Right atrial size was severely dilated. Pericardium: There is no evidence of pericardial effusion. Mitral Valve: The mitral valve is degenerative in appearance. Moderate mitral annular calcification. Mild mitral valve regurgitation. The mean mitral valve gradient is 2.0 mmHg with average heart rate of 52 bpm. Tricuspid Valve: The tricuspid valve is normal in structure. Tricuspid valve regurgitation is mild . No evidence of tricuspid stenosis. Aortic Valve: The aortic valve was not well visualized. Aortic valve regurgitation is not visualized. Mild aortic stenosis is present. Aortic valve mean gradient measures 12.0 mmHg. Aortic valve peak gradient measures 22.5 mmHg. Aortic valve area, by VTI  measures 1.92 cm. Pulmonic Valve: The pulmonic valve was not well visualized. Pulmonic valve regurgitation is not visualized. Aorta: The aortic root and ascending aorta are structurally normal, with no evidence of dilitation. Venous: The inferior vena cava is dilated in size with less than 50% respiratory variability, suggesting right atrial pressure of 15 mmHg. IAS/Shunts: The atrial septum is grossly normal.  LEFT VENTRICLE PLAX 2D LVIDd:         3.20 cm      Diastology  LVIDs:         3.10 cm      LV e' medial:    3.37 cm/s LV PW:         1.50 cm      LV E/e' medial:  27.7 LV IVS:        1.50 cm      LV e' lateral:   5.55 cm/s LVOT diam:     1.90 cm      LV E/e' lateral: 16.8 LV SV:         93 LV SV Index:   49 LVOT Area:     2.84 cm  LV Volumes (MOD) LV vol d, MOD A2C: 118.0 ml LV vol d, MOD A4C: 105.0 ml LV vol s, MOD A2C: 85.2 ml LV vol s, MOD A4C: 64.2 ml LV SV MOD A2C:     32.8 ml LV SV MOD A4C:     105.0 ml LV SV MOD BP:      41.4 ml RIGHT VENTRICLE            IVC RV S prime:     8.16 cm/s  IVC diam: 2.20 cm TAPSE (M-mode): 1.9 cm LEFT ATRIUM            Index        RIGHT ATRIUM           Index LA diam:      4.50 cm  2.37 cm/m   RA Area:     24.60 cm LA Vol (A4C): 104.0 ml 54.80 ml/m  RA Volume:   86.10 ml  45.37 ml/m  AORTIC VALVE AV Area (Vmax):    1.64 cm AV Area (Vmean):   1.92 cm AV Area (VTI):     1.92 cm AV Vmax:           237.00 cm/s AV Vmean:          149.000 cm/s AV VTI:  0.484 m AV Peak Grad:      22.5 mmHg AV Mean Grad:      12.0 mmHg LVOT Vmax:         137.00 cm/s LVOT Vmean:        101.000 cm/s LVOT VTI:          0.328 m LVOT/AV VTI ratio: 0.68  AORTA Ao Root diam: 2.80 cm Ao Asc diam:  3.60 cm MITRAL VALVE               TRICUSPID VALVE MV Area (PHT): 2.65 cm    TR Peak grad:   21.7 mmHg MV Mean grad:  2.0 mmHg    TR Vmax:        233.00 cm/s MV E velocity: 93.40 cm/s MV A velocity: 80.10 cm/s  SHUNTS MV E/A ratio:  1.17        Systemic VTI:  0.33 m                            Systemic Diam: 1.90 cm Stanly Leavens MD Electronically signed by Stanly Leavens MD Signature Date/Time: 08/20/2023/10:43:30 AM    Final    DG Chest 2 View Result Date: 08/19/2023 CLINICAL DATA:  Shortness of breath.  CHF. EXAM: CHEST - 2 VIEW COMPARISON:  05/29/2021. FINDINGS: Bilateral lung fields are clear. Bilateral costophrenic angles are clear. Stable cardio-mediastinal silhouette. No acute osseous abnormalities. The soft tissues are within normal limits.  IMPRESSION: No active cardiopulmonary disease. Electronically Signed   By: Ree Molt M.D.   On: 08/19/2023 11:12   Assessment and Plan:  Acute on chronic HFmrEF Mild AS  Hypertension  History of brief episodes of NSVT on cardiac monitor  Presented to ED with DOE, orthopnea, LE edema  BNP 478 Echo this admission: LVEF 45-50% (previously 60-65% in 09/2022), severe LVH, normal RV, severe BAE, mild MR, mild AS, dilated IVC Home meds: PO Lasix  20 mg PRN for edema, Toprol  12.5 mg daily Given IV Lasix  20 mg x 1 dose, creatinine and BUN rose, transitioned to PO Lasix  20 mg Net - 2.1 L this admission BP 153/67 with HR 58 -- prior to AM medications Patient reports much improvement in her LE edema, still feeling lightheaded and short of breath, of note patient has history of orthostatic hypotension  Continue Toprol  12.5 mg daily Continue PO Lasix  20 mg daily Patient states that given her age she does not wish to add on more medications, she wishes to remove the fluid while in patient and come up with better dosing plan with PO Lasix  at discharge  Continue to monitor strict I&O's, daily weights, daily BMPs  Goals of care Followed closely by palliative medicine Patient states she wants to discharge home and remain independent as long as possible MOST form was completed with patient  Currently DNR  She plans to discharge home, where she lives alone, therefore is concerned about being discharged before she is ready   Per primary CKD stage 3b GERD  Risk Assessment/Risk Scores:      [place]  Heart Association (NYHA) Functional Class NYHA Class II     For questions or updates, please contact [facility] Please consult [email or web address] for contact info under    Signed, Lynwood Schilling, MD  08/21/2023 2:09 PM   History and all data above reviewed.  I personally took the history today, performed the physical exam and formulated the assessment and plan.  I reviewed all  relevant  tests and studies. Patient examined.  I agree with the findings as above.  The patient is quite independent.  She still drives.  She does the grocery shopping.  She noticed that she was getting some increased lower extremity swelling and that is really why she presented.  She does have some chronic dyspnea but this seems to be about the same.  She is not describing new PND or orthopnea.  She gets occasional chest discomfort which she describes as a stump pain and she takes nitroglycerin  periodically.  She has been doing this a little more frequently.  She has not had any syncope but she does have some mild dizziness when standing.  She does not have any falls.  She feels better since she has had diuresis.  The patient exam reveals COR: Regular rate and rhythm, 2 out of 6 apical mid peaking systolic murmur radiating up aortic outflow tract, no diastolic,  Lungs: Few basilar crackles bilaterally,  Abd: Positive bowel sounds, no rebound no guarding.Ext Trace bilateral ankle edema  .  All available labs, radiology testing, previous records reviewed. Agree with documented assessment and plan.  Acute on chronic systolic and diastolic heart failure: The patient does have a slightly reduced ejection fraction compared to previous.    Given her advanced age and goals for palliative care certainly no invasive testing is indicated and I would not suggest further ischemia workup.  I would manage medically.  Given the use of nitroglycerin  I will go ahead and start Imdur  30 mg but we will have to watch her blood pressure and symptoms.  Aortic stenosis: This is mild.  No further workup.  Diastolic dysfunction: The patient has severe LVH on echo.  I would not suggest further workup rather just management of symptoms.  She has had a reasonable diuresis that she is starting to bump her creatinine mildly.   I agree with p.o.  Lasix  daily.  She was only taking it as needed.  We talked about symptoms of might happen if she gets  dehydrated and we will need to watch her closely as an outpatient.   NSVT: I will check a mag level.  Continue metoprolol .  Palliative care has been consulted.  She is a DNR/DNI.  Lynwood Phillipa Morden  2:09 PM  08/21/2023

## 2023-08-21 NOTE — Progress Notes (Signed)
  Progress Note   Patient: [name] FMW:[id number] DOB: 08-19-1923 DOA: 08/19/2023     0 DOS: the patient was seen and examined on 08/21/2023 at 10:04AM      Brief hospital course: [name] is a 89 y.o. female with a history of HFpEF, CKD stage IIIb, CAD, hypertension.  Patient presented secondary to leg swelling and dyspnea on exertion and found to have evidence of acute heart failure. Lasix  diuresis started. Transthoracic Echocardiogram obtained and is significant for newly reduced LVEF.     Assessment and Plan: * Acute on chronic heart failure with mildly reduced ejection fraction (HFpEF) (HCC) Mild aortic stenosis Admitted and diuresed.  Echocardiogram here showed EF 45 to 50%. Now symptoms back to baseline.  Cardiology were consulted, recommended Imdur  and monitoring on Imdur  given her age.  Continue oral Lasix . - Continue metoprolol , Lasix  - Start new Imdur    CKD (chronic kidney disease) stage 3b, GFR 30-59 ml/min (HCC) Creatinine stable relative to baseline   CAD (coronary artery disease) Essential hypertension Blood pressure elevated -Continue metoprolol  - Continue Lasix  - Add new Imdur   Nonsustained VT Mag normal, K 3.6         Subjective: She reports that she is close to her baseline.  She is feeling a little bit out of breath, but this is essentially her baseline.  Mentation is normal.  Cardiology was consulted today.     Physical Exam: BP (!) 154/66   Pulse (!) 58   Temp 98.5 F (36.9 C) (Oral)   Resp 15   Ht 5' 6 (1.676 m)   Wt 80.2 kg   SpO2 100%   BMI 28.54 kg/m   Elderly adult female, lying in bed, interactive and appropriate RRR, soft systolic murmur, no peripheral edema Respiratory normal, lungs diminished overall, atelectatic crackles, no rales, no wheezing Abdomen soft, no tenderness palpation Attention normal, affect normal, judgment and insight appear normal for age, generalized weakness but symmetric    Data  Reviewed: Discussed with cardiology team Basic metabolic panel shows creatinine is slightly up to 1.47, BUN slightly up today Magnesium normal CBC shows mild leukopenia     Family Communication: Patient requested I not call    Disposition: Status is: Observation Given age, and addition of new Imdur , I recommend observation int he hospital overnight        Author: [name], MD 08/21/2023 5:28 PM  For on call review [email or web address].

## 2023-08-22 ENCOUNTER — Other Ambulatory Visit: Payer: Self-pay

## 2023-08-22 ENCOUNTER — Other Ambulatory Visit (HOSPITAL_COMMUNITY): Payer: Self-pay

## 2023-08-22 DIAGNOSIS — I5033 Acute on chronic diastolic (congestive) heart failure: Secondary | ICD-10-CM | POA: Diagnosis not present

## 2023-08-22 LAB — COMPREHENSIVE METABOLIC PANEL WITH GFR
ALT: 10 U/L (ref 0–44)
AST: 18 U/L (ref 15–41)
Albumin: 3.4 g/dL — ABNORMAL LOW (ref 3.5–5.0)
Alkaline Phosphatase: 44 U/L (ref 38–126)
Anion gap: 9 (ref 5–15)
BUN: 24 mg/dL — ABNORMAL HIGH (ref 8–23)
CO2: 27 mmol/L (ref 22–32)
Calcium: 8.8 mg/dL — ABNORMAL LOW (ref 8.9–10.3)
Chloride: 98 mmol/L (ref 98–111)
Creatinine, Ser: 1.47 mg/dL — ABNORMAL HIGH (ref 0.44–1.00)
GFR, Estimated: 32 mL/min — ABNORMAL LOW (ref >60)
Glucose, Bld: 90 mg/dL (ref 70–99)
Potassium: 3.6 mmol/L (ref 3.5–5.1)
Sodium: 134 mmol/L — ABNORMAL LOW (ref 135–145)
Total Bilirubin: 1 mg/dL (ref 0.0–1.2)
Total Protein: 6.4 g/dL — ABNORMAL LOW (ref 6.5–8.1)

## 2023-08-22 LAB — CBC
HCT: 33.8 % — ABNORMAL LOW (ref 36.0–46.0)
Hemoglobin: 11.3 g/dL — ABNORMAL LOW (ref 12.0–15.0)
MCH: 31.1 pg (ref 26.0–34.0)
MCHC: 33.4 g/dL (ref 30.0–36.0)
MCV: 93.1 fL (ref 80.0–100.0)
Platelets: 245 K/uL (ref 150–400)
RBC: 3.63 MIL/uL — ABNORMAL LOW (ref 3.87–5.11)
RDW: 14.8 % (ref 11.5–15.5)
WBC: 3.5 K/uL — ABNORMAL LOW (ref 4.0–10.5)
nRBC: 0 % (ref 0.0–0.2)

## 2023-08-22 MED ORDER — FUROSEMIDE 20 MG PO TABS
20.0000 mg | ORAL_TABLET | Freq: Every day | ORAL | 0 refills | Status: DC
  Filled 2023-08-22: qty 90, 90d supply, fill #0

## 2023-08-22 MED ORDER — ISOSORBIDE MONONITRATE ER 30 MG PO TB24
30.0000 mg | ORAL_TABLET | Freq: Every day | ORAL | 0 refills | Status: DC
  Filled 2023-08-22 (×2): qty 90, 90d supply, fill #0

## 2023-08-22 NOTE — Progress Notes (Addendum)
  Progress Note  Patient Name: [name] Date of Encounter: 08/22/2023 [facility] Cardiologist: Lynwood Schilling, MD   Interval Summary   Reports feeling much better Eager to go home   Vital Signs Vitals:   08/21/23 1400 08/21/23 2221 08/22/23 0524 08/22/23 0914  BP: (!) 154/66 (!) 104/52 129/63 (!) 121/53  Pulse: (!) 58 (!) 52 (!) 52 (!) 57  Resp: 16 18 14    Temp: 98.5 F (36.9 C) 97.7 F (36.5 C) 98.3 F (36.8 C)   TempSrc: Oral Oral Oral   SpO2:  98% 100% 100%  Weight:      Height:        Intake/Output Summary (Last 24 hours) at 08/22/2023 0924 Last data filed at 08/22/2023 0748 Gross per 24 hour  Intake 480 ml  Output 1500 ml  Net -1020 ml      08/20/2023    7:16 AM 08/19/2023    2:10 PM 05/14/2023    1:25 PM  Last 3 Weights  Weight (lbs) 176 lb 12.9 oz 178 lb 2.1 oz 181 lb 6.4 oz  Weight (kg) 80.2 kg 80.8 kg 82.283 kg     Telemetry/ECG  Sinus bradycardia, HR 54, 1st degree AVB - Personally Reviewed  Physical Exam  GEN: No acute distress.   Neck: No JVD Cardiac: bradycardic; soft systolic murmur .  Respiratory: mild crackles at bases   GI: Soft, nontender, non-distended  MS: No edema  Assessment & Plan  89 y.o. female with a hx of chronic HFpEF, HTN, CKD, mild AS, history of GI bleed, minimal CAD per cath in 2003, brief NSVT episodes noted on monitor  who is being seen for acute on chronic HFpEF with newly moderately reduced EF  Acute on chronic HFmrEF Mild AS  Hypertension  History of brief episodes of NSVT on cardiac monitor  Presented to ED with DOE, orthopnea, LE edema  BNP 478 Echo this admission: LVEF 45-50% (previously 60-65% in 09/2022), severe LVH, normal RV, severe BAE, mild MR, mild AS, dilated IVC Home meds: PO Lasix  20 mg PRN for edema, Toprol  12.5 mg daily Given IV Lasix  20 mg x 1 dose, creatinine and BUN rose, transitioned to PO Lasix  20 mg Net - 3.2 L this admission BP 129/63 with HR 52  Creatinine stable, BUN trending  down Continue Toprol  12.5 mg daily Continue PO Lasix  20 mg daily Continue Imdur  30 mg daily  Patient states that given her age she does not wish to add on more medications, she wishes to remove the fluid while in patient and come up with better dosing plan with PO Lasix  at discharge  Continue to monitor strict I&O's, daily weights, daily BMPs Will schedule close outpatient follow up    Goals of care Followed closely by palliative medicine Patient states she wants to discharge home and remain independent as long as possible MOST form was completed with patient  Currently DNR  She plans to discharge home, where she lives alone, therefore is concerned about being discharged before she is ready    Per primary CKD stage 3b GERD   For questions or updates, please contact [facility] Please consult [email or web address] for contact info under       Signed, Brenda [name], PA-C

## 2023-08-22 NOTE — Discharge Summary (Signed)
 Physician Discharge Summary   Patient: [name] MRN: [id number] DOB: 24-Nov-1923  Admit date:     08/19/2023  Discharge date: 08/22/23  Discharge Physician: [name]   PCP: Jerrie Gathers, DO     Recommendations at discharge:  Follow up with PCP Dr. Jerrie in 1 week for CHF flare Dr. Jerrie: Please check BMP in 1 week on daily Lasix  (frequency increased) and adjust as appropriate     Discharge Diagnoses: Principal Problem:   Acute on chronic heart failure with preserved ejection fraction (HFpEF) (HCC) Active Problems:   GERD without esophagitis   Essential hypertension   CAD (coronary artery disease)   CKD (chronic kidney disease) stage 3, GFR 30-59 ml/min [facility])      Hospital Course: [name] is a 89 y.o. female with a history of HFpEF, CKD stage IIIb, CAD, hypertension.  Patient presented secondary to leg swelling and dyspnea on exertion and found to have evidence of acute heart failure. Lasix  diuresis started. Transthoracic Echocardiogram obtained and is significant for newly reduced LVEF.   * Acute on chronic heart failure with mildly reduced ejection fraction (HFpEF) (HCC) Mild aortic stenosis Admitted and diuresed.  Echocardiogram here showed EF 45 to 50%.  Now symptoms back to baseline.  Cardiology were consulted, recommended Imdur   Discharged on daily Lasix , new Imdur .  Recommend BMP in 1 week with PCP      CKD (chronic kidney disease) stage 3b, GFR 30-59 ml/min (HCC) Creatinine stable relative to baseline     CAD (coronary artery disease) with chronic stable angina Essential hypertension Started on new Imdur    Nonsustained VT Brief, asymptomatic.  Electrolytes repleted.                 The [place]  Controlled Substances Registry was reviewed for this patient prior to discharge.  Consultants: Cardiology Dr. Lavona Procedures performed: Echocardiogram  Disposition: Home health Diet recommendation:   Discharge Diet Orders (From admission, onward)     Start     Ordered   08/22/23 0000  Diet - low sodium heart healthy        08/22/23 1108             DISCHARGE MEDICATION: Allergies as of 08/22/2023       Reactions   Ezetimibe Anaphylaxis   Other reaction(s): anaphylaxis   Lipitor [atorvastatin] Swelling   Pravachol [pravastatin] Swelling   Statins Swelling, Other (See Comments)   Swelling of mouth and lips Other reaction(s): anaphylaxis   Zocor [simvastatin] Swelling   Cholestyramine Nausea And Vomiting        Medication List     TAKE these medications    acetaminophen  650 MG CR tablet Commonly known as: TYLENOL  Take 650 mg by mouth every 6 (six) hours.   acetaminophen  500 MG tablet Commonly known as: TYLENOL  Take 1,000 mg by mouth every 6 (six) hours.   albuterol  108 (90 Base) MCG/ACT inhaler Commonly known as: Ventolin  HFA Inhale 1-2 puffs into the lungs every 6 (six) hours as needed for wheezing or shortness of breath.   artificial tears ophthalmic solution Place 1 drop into both eyes as needed for dry eyes.   diclofenac  sodium 1 % Gel Commonly known as: VOLTAREN  Apply 2 g topically 2 (two) times daily as needed (pain).   ferrous sulfate  325 (65 FE) MG EC tablet Take 1 tablet (325 mg total) by mouth daily with breakfast.   furosemide  20 MG tablet Commonly known as: LASIX  Take 1 tablet (20  mg total) by mouth daily. What changed: See the new instructions.   GAVISCON ACID BRKTHRGH FORMULA PO Take 1 tablet by mouth as needed (Indigestion).   calcium  carbonate 750 MG chewable tablet Commonly known as: TUMS EX Chew 1 tablet by mouth as needed for heartburn.   isosorbide  mononitrate 30 MG 24 hr tablet Commonly known as: IMDUR  Take 1 tablet (30 mg total) by mouth daily. Start taking on: August 23, 2023   metoprolol  succinate 25 MG 24 hr tablet Commonly known as: TOPROL -XL Take 0.5 tablets (12.5 mg total) by mouth daily.   multivitamin with  minerals Tabs tablet Take 1 tablet by mouth daily.   nitroGLYCERIN  0.4 MG SL tablet Commonly known as: NITROSTAT  Place 1 tablet (0.4 mg total) under the tongue every 5 (five) minutes as needed for chest pain.   vitamin C 1000 MG tablet Take 1,000 mg by mouth daily.   Vitamin D3 50 MCG (2000 UT) Tabs Take 2,000 Units by mouth daily.        Follow-up Information     [place], Virali, DO. Schedule an appointment as soon as possible for a visit in 1 week(s).   Specialty: Family Medicine Contact information: [address] [place] [address] [phone]                 Discharge Instructions     Diet - low sodium heart healthy   Complete by: As directed    Discharge instructions   Complete by: As directed    **IMPORTANT DISCHARGE INSTRUCTIONS**   From Dr. Jonel: You were admited for fluid overload (feeling out of breath and having swollen legs) due to congestive heart failure flaring up  You were treated here with diuretics and this improved  Take furosemide  20 mg daily  START the new medicine isosorbide -mononitrate/Imdur  30 mg once daily  Go see your primary doctor in 1 week Have your kidney function checked   Increase activity slowly   Complete by: As directed        Discharge Exam: Filed Weights   08/19/23 1410 08/20/23 0716  Weight: 80.8 kg 80.2 kg    General: Pt is alert, awake, not in acute distress Cardiovascular: RRR, nl S1-S2, no murmurs appreciated.   No LE edema.   Respiratory: Normal respiratory rate and rhythm.  CTAB without rales or wheezes. Abdominal: Abdomen soft and non-tender.  No distension or HSM.   Neuro/Psych: Strength symmetric in upper and lower extremities.  Judgment and insight appear normal.   Condition at discharge: good  The results of significant diagnostics from this hospitalization (including imaging, microbiology, ancillary and laboratory) are listed below for reference.   Imaging Studies: ECHOCARDIOGRAM  COMPLETE Result Date: 08/20/2023    ECHOCARDIOGRAM REPORT   Patient Name:   [name] Date of Exam: 08/20/2023 Medical Rec #:  [id number]      Height:       66.0 in Accession #:    [id number]     Weight:       176.8 lb Date of Birth:  1923-02-24      BSA:          1.898 m Patient Age:    89 years       BP:           146/62 mmHg Patient Gender: F              HR:           54 bpm. Exam Location:  Inpatient Procedure: 2D Echo, Color Doppler and Cardiac Doppler (Both Spectral and Color            Flow Doppler were utilized during procedure). Indications:    CHF Acute Diastolic I50.31  History:        Patient has prior history of Echocardiogram examinations, most                 recent 09/18/2019.  Sonographer:    Tinnie Gosling RDCS Referring Phys: [phone] STEVEN J NEWTON IMPRESSIONS  1. Left ventricular ejection fraction, by estimation, is 45 to 50%. The left ventricle has mildly decreased function. The left ventricle demonstrates global hypokinesis. There is severe concentric left ventricular hypertrophy. Left ventricular diastolic  function could not be evaluated.  2. Right ventricular systolic function is normal. The right ventricular size is normal.  3. Left atrial size was severely dilated.  4. Right atrial size was severely dilated.  5. The mitral valve is degenerative. Mild mitral valve regurgitation. The mean mitral valve gradient is 2.0 mmHg with average heart rate of 52 bpm. Moderate mitral annular calcification.  6. The aortic valve was not well visualized. Aortic valve regurgitation is not visualized. Mild aortic valve stenosis. Aortic valve area, by VTI measures 1.92 cm. Aortic valve Vmax measures 2.37 m/s.  7. The inferior vena cava is dilated in size with <50% respiratory variability, suggesting right atrial pressure of 15 mmHg. Comparison(s): Prior images reviewed side by side. Function is less vigorous from last study; aortic regurgitation not seen in this study. FINDINGS  Left Ventricle: Left  ventricular ejection fraction, by estimation, is 45 to 50%. The left ventricle has mildly decreased function. The left ventricle demonstrates global hypokinesis. The left ventricular internal cavity size was small. There is severe concentric left ventricular hypertrophy. Abnormal (paradoxical) septal motion, consistent with left bundle branch block. Left ventricular diastolic function could not be evaluated due to mitral annular calcification (moderate or greater). Left ventricular diastolic function could not be evaluated. Right Ventricle: The right ventricular size is normal. No increase in right ventricular wall thickness. Right ventricular systolic function is normal. Left Atrium: Left atrial size was severely dilated. Right Atrium: Right atrial size was severely dilated. Pericardium: There is no evidence of pericardial effusion. Mitral Valve: The mitral valve is degenerative in appearance. Moderate mitral annular calcification. Mild mitral valve regurgitation. The mean mitral valve gradient is 2.0 mmHg with average heart rate of 52 bpm. Tricuspid Valve: The tricuspid valve is normal in structure. Tricuspid valve regurgitation is mild . No evidence of tricuspid stenosis. Aortic Valve: The aortic valve was not well visualized. Aortic valve regurgitation is not visualized. Mild aortic stenosis is present. Aortic valve mean gradient measures 12.0 mmHg. Aortic valve peak gradient measures 22.5 mmHg. Aortic valve area, by VTI  measures 1.92 cm. Pulmonic Valve: The pulmonic valve was not well visualized. Pulmonic valve regurgitation is not visualized. Aorta: The aortic root and ascending aorta are structurally normal, with no evidence of dilitation. Venous: The inferior vena cava is dilated in size with less than 50% respiratory variability, suggesting right atrial pressure of 15 mmHg. IAS/Shunts: The atrial septum is grossly normal.  LEFT VENTRICLE PLAX 2D LVIDd:         3.20 cm      Diastology LVIDs:         3.10  cm      LV e' medial:    3.37 cm/s LV PW:         1.50 cm  LV E/e' medial:  27.7 LV IVS:        1.50 cm      LV e' lateral:   5.55 cm/s LVOT diam:     1.90 cm      LV E/e' lateral: 16.8 LV SV:         93 LV SV Index:   49 LVOT Area:     2.84 cm  LV Volumes (MOD) LV vol d, MOD A2C: 118.0 ml LV vol d, MOD A4C: 105.0 ml LV vol s, MOD A2C: 85.2 ml LV vol s, MOD A4C: 64.2 ml LV SV MOD A2C:     32.8 ml LV SV MOD A4C:     105.0 ml LV SV MOD BP:      41.4 ml RIGHT VENTRICLE            IVC RV S prime:     8.16 cm/s  IVC diam: 2.20 cm TAPSE (M-mode): 1.9 cm LEFT ATRIUM            Index        RIGHT ATRIUM           Index LA diam:      4.50 cm  2.37 cm/m   RA Area:     24.60 cm LA Vol (A4C): 104.0 ml 54.80 ml/m  RA Volume:   86.10 ml  45.37 ml/m  AORTIC VALVE AV Area (Vmax):    1.64 cm AV Area (Vmean):   1.92 cm AV Area (VTI):     1.92 cm AV Vmax:           237.00 cm/s AV Vmean:          149.000 cm/s AV VTI:            0.484 m AV Peak Grad:      22.5 mmHg AV Mean Grad:      12.0 mmHg LVOT Vmax:         137.00 cm/s LVOT Vmean:        101.000 cm/s LVOT VTI:          0.328 m LVOT/AV VTI ratio: 0.68  AORTA Ao Root diam: 2.80 cm Ao Asc diam:  3.60 cm MITRAL VALVE               TRICUSPID VALVE MV Area (PHT): 2.65 cm    TR Peak grad:   21.7 mmHg MV Mean grad:  2.0 mmHg    TR Vmax:        233.00 cm/s MV E velocity: 93.40 cm/s MV A velocity: 80.10 cm/s  SHUNTS MV E/A ratio:  1.17        Systemic VTI:  0.33 m                            Systemic Diam: 1.90 cm Stanly Leavens MD Electronically signed by Stanly Leavens MD Signature Date/Time: 08/20/2023/10:43:30 AM    Final    DG Chest 2 View Result Date: 08/19/2023 CLINICAL DATA:  Shortness of breath.  CHF. EXAM: CHEST - 2 VIEW COMPARISON:  05/29/2021. FINDINGS: Bilateral lung fields are clear. Bilateral costophrenic angles are clear. Stable cardio-mediastinal silhouette. No acute osseous abnormalities. The soft tissues are within normal limits. IMPRESSION: No  active cardiopulmonary disease. Electronically Signed   By: Ree Molt M.D.   On: 08/19/2023 11:12    Microbiology: Results for orders placed or performed during the hospital encounter of 08/19/23  Resp panel  by RT-PCR (RSV, Flu A&B, Covid) Anterior Nasal Swab     Status: None   Collection Time: 08/19/23 11:11 AM   Specimen: Anterior Nasal Swab  Result Value Ref Range Status   SARS Coronavirus 2 by RT PCR NEGATIVE NEGATIVE Final    Comment: (NOTE) SARS-CoV-2 target nucleic acids are NOT DETECTED.  The SARS-CoV-2 RNA is generally detectable in upper respiratory specimens during the acute phase of infection. The lowest concentration of SARS-CoV-2 viral copies this assay can detect is 138 copies/mL. A negative result does not preclude SARS-Cov-2 infection and should not be used as the sole basis for treatment or other patient management decisions. A negative result may occur with  improper specimen collection/handling, submission of specimen other than nasopharyngeal swab, presence of viral mutation(s) within the areas targeted by this assay, and inadequate number of viral copies(<138 copies/mL). A negative result must be combined with clinical observations, patient history, and epidemiological information. The expected result is Negative.  Fact Sheet for Patients:  BloggerCourse.com  Fact Sheet for Healthcare Providers:  SeriousBroker.it  This test is no t yet approved or cleared by the [place]  FDA and  has been authorized for detection and/or diagnosis of SARS-CoV-2 by FDA under an Emergency Use Authorization (EUA). This EUA will remain  in effect (meaning this test can be used) for the duration of the COVID-19 declaration under Section 564(b)(1) of the Act, 21 U.S.C.section 360bbb-3(b)(1), unless the authorization is terminated  or revoked sooner.       Influenza A by PCR NEGATIVE NEGATIVE Final   Influenza B by PCR  NEGATIVE NEGATIVE Final    Comment: (NOTE) The Xpert Xpress SARS-CoV-2/FLU/RSV plus assay is intended as an aid in the diagnosis of influenza from Nasopharyngeal swab specimens and should not be used as a sole basis for treatment. Nasal washings and aspirates are unacceptable for Xpert Xpress SARS-CoV-2/FLU/RSV testing.  Fact Sheet for Patients: BloggerCourse.com  Fact Sheet for Healthcare Providers: SeriousBroker.it  This test is not yet approved or cleared by the [place]  FDA and has been authorized for detection and/or diagnosis of SARS-CoV-2 by FDA under an Emergency Use Authorization (EUA). This EUA will remain in effect (meaning this test can be used) for the duration of the COVID-19 declaration under Section 564(b)(1) of the Act, 21 U.S.C. section 360bbb-3(b)(1), unless the authorization is terminated or revoked.     Resp Syncytial Virus by PCR NEGATIVE NEGATIVE Final    Comment: (NOTE) Fact Sheet for Patients: BloggerCourse.com  Fact Sheet for Healthcare Providers: SeriousBroker.it  This test is not yet approved or cleared by the [place]  FDA and has been authorized for detection and/or diagnosis of SARS-CoV-2 by FDA under an Emergency Use Authorization (EUA). This EUA will remain in effect (meaning this test can be used) for the duration of the COVID-19 declaration under Section 564(b)(1) of the Act, 21 U.S.C. section 360bbb-3(b)(1), unless the authorization is terminated or revoked.  Performed at [facility], [address], [place], [place] [address]     Labs: CBC: Recent Labs  Lab 08/19/23 [phone] 08/20/23 0418 08/22/23 0644  WBC 3.6* 3.2* 3.5*  NEUTROABS 2.6  --   --   HGB 11.5* 12.1 11.3*  HCT 35.8* 36.8 33.8*  MCV 95.0 94.1 93.1  PLT 235 233 245   Basic Metabolic Panel: Recent Labs  Lab 08/19/23 0955 08/20/23 0418  08/21/23 0423 08/21/23 1600 08/22/23 0644  NA 138 143 137  --  134*  K 4.4 3.8 3.6  --  3.6  CL 104 105 101  --  98  CO2 24 27 26   --  27  GLUCOSE 98 87 93  --  90  BUN 23 27* 29*  --  24*  CREATININE 1.22* 1.32* 1.47*  --  1.47*  CALCIUM  9.5 10.0 8.9  --  8.8*  MG  --   --   --  2.0  --    Liver Function Tests: Recent Labs  Lab 08/20/23 0418 08/22/23 0644  AST 20 18  ALT 13 10  ALKPHOS 52 44  BILITOT 0.5 1.0  PROT 6.8 6.4*  ALBUMIN 3.7 3.4*   CBG: Recent Labs  Lab 08/19/23 1809  GLUCAP 116*    Discharge time spent: approximately 45 minutes spent on discharge counseling, evaluation of patient on day of discharge, and coordination of discharge planning with nursing, social work, pharmacy and case management  Signed: [name], MD [facility] 08/22/2023

## 2023-08-22 NOTE — TOC Transition Note (Incomplete)
 Transition of Care [facility]) - Discharge Note   Patient Details  Name: [name] MRN: [id number] Date of Birth: May 20, 1923  [facility] CM/SW Contact:  Tawni CHRISTELLA [name], LCSW Phone Number: 08/22/2023, 11:35 AM   Clinical Narrative:     Pt was given taxi voucher for d/c home .       Patient Goals and CMS Choice Patient states their goals for this hospitalization and ongoing recovery are:: return home CMS Medicare.gov Compare Post Acute Care list provided to:: Patient        Discharge Placement                       Discharge Plan and Services Additional resources added to the After Visit Summary for                                       Social Drivers of Health (SDOH) Interventions SDOH Screenings   Food Insecurity: No Food Insecurity (08/19/2023)  Housing: Low Risk  (08/19/2023)  Transportation Needs: No Transportation Needs (08/19/2023)  Utilities: Not At Risk (08/19/2023)  Alcohol Screen: Low Risk  (10/10/2022)  Depression (PHQ2-9): Low Risk  (05/14/2023)  Financial Resource Strain: Low Risk  (10/10/2022)  Physical Activity: Inactive (10/10/2022)  Social Connections: Moderately Isolated (08/19/2023)  Stress: No Stress Concern Present (10/10/2022)  Tobacco Use: Medium Risk (08/19/2023)  Health Literacy: Adequate Health Literacy (10/10/2022)     Readmission Risk Interventions    09/24/2022    2:02 PM  Readmission Risk Prevention Plan  Post Dischage Appt --  Medication Screening Complete  Transportation Screening Complete

## 2023-08-22 NOTE — Plan of Care (Signed)

## 2023-08-22 NOTE — Progress Notes (Signed)
 Heart Failure Navigator Progress Note  Assessed for Heart & Vascular TOC clinic readiness.  Patient does not meet criteria due to has a scheduled [facility] appointment on 09/06/2023. Patient followed closely by Palliative Medicine. No HF TOC. .   Navigator will sign off at this time.   Stephane Haddock, BSN, Scientist, clinical (histocompatibility and immunogenetics) Only

## 2023-08-22 NOTE — Progress Notes (Signed)
 Discharge medications delivered to patient at bedside [facility]

## 2023-08-26 ENCOUNTER — Ambulatory Visit (INDEPENDENT_AMBULATORY_CARE_PROVIDER_SITE_OTHER)

## 2023-08-26 ENCOUNTER — Other Ambulatory Visit

## 2023-08-26 VITALS — BP 150/68 | HR 69 | Wt 175.0 lb

## 2023-08-26 DIAGNOSIS — Z09 Encounter for follow-up examination after completed treatment for conditions other than malignant neoplasm: Secondary | ICD-10-CM | POA: Diagnosis not present

## 2023-08-26 DIAGNOSIS — I509 Heart failure, unspecified: Secondary | ICD-10-CM | POA: Diagnosis not present

## 2023-08-26 NOTE — Progress Notes (Signed)
     SUBJECTIVE:   CHIEF COMPLAINT / HPI:   [name] presents today for hospital follow up.   Hospitalized at [facility] from 08/19/2023 to 08/22/2023, for CHF exacerbation.  Since discharge, patient reports overall feeling better with less LE edema. She still reports DOE, not worse than normal. There is some persistent LE edema but this is much better than when she was first admitted. She denies CP, fever, urinary issues or any other complaints at this time. She was d/c with 20mg  furosemide  daily and 40mg  imdur  daily. She admits taking all her medications appropriately. She has been keeping a diary of her weight and stays she has remained stable at 174.2lbs since her hospital stay.   PERTINENT  PMH / PSH: HFpEF, CKD Stage 3b  OBJECTIVE:   BP (!) 150/68   Pulse 69   Wt 175 lb (79.4 kg)   SpO2 98%   BMI 28.25 kg/m    General: A&O, NAD HEENT: No sign of trauma, EOM grossly intact Cardiac: RRR, 2/6 systolic murmur Respiratory: CTAB, normal WOB, no w/c/r, good air movement throughout all lung fields GI: Soft, NTTP, non-distended  Extremities: NTTP, mild LE edema bilaterally, L worse than R without pitting, no overlying skin changes  Neuro: Normal gait with cane, moves all four extremities appropriately. Psych: Appropriate mood and affect   ASSESSMENT/PLAN:   Assessment & Plan Hospital discharge follow-up Admitted 07/07-07/10 for CHF exacerbation after presenting with DOE and CP. Patient was diuresed and d/c home on 20mg  furosemide  and 30mg  indur. BMP scheduled for tomorrow, July 15th afternoon at [facility] Patient transportation has verbally confirmed they will bring the patient.  -Continue furosemide  and imdur  at this time. -Continue weight diary.  -Will assess kidney function stability with BMP.      Camie Dixons, DO [facility]

## 2023-08-26 NOTE — Patient Instructions (Addendum)
 It was wonderful to see you today.  Please bring ALL of your medications with you to every visit.   Today we talked about:  I am glad you are feeling so much better. Continue taking your furosemide  20mg  tablets daily. Make a lab appointment to get a BMP. Return in 1 month for follow up with PCP.  Thank you for choosing [facility].   Please call [phone] with any questions about today's appointment.  Please arrive at least 15 minutes prior to your scheduled appointments.   If you had blood work today, I will send you a MyChart message or a letter if results are normal. Otherwise, I will give you a call.   If you had a referral placed, they will call you to set up an appointment. Please give us  a call if you don't hear back in the next 2 weeks.   If you need additional refills before your next appointment, please call your pharmacy first.   You should follow up in our clinic in No follow-ups on file.  Camie Dixons, DO Family Medicine

## 2023-08-27 ENCOUNTER — Other Ambulatory Visit

## 2023-08-28 LAB — BASIC METABOLIC PANEL WITH GFR
BUN/Creatinine Ratio: 16 (ref 12–28)
BUN: 26 mg/dL (ref 10–36)
CO2: 21 mmol/L (ref 20–29)
Calcium: 10.2 mg/dL (ref 8.7–10.3)
Chloride: 103 mmol/L (ref 96–106)
Creatinine, Ser: 1.65 mg/dL — ABNORMAL HIGH (ref 0.57–1.00)
Glucose: 92 mg/dL (ref 70–99)
Potassium: 5.2 mmol/L (ref 3.5–5.2)
Sodium: 142 mmol/L (ref 134–144)
eGFR: 28 mL/min/1.73 — ABNORMAL LOW (ref >59)

## 2023-08-29 ENCOUNTER — Telehealth: Payer: Self-pay

## 2023-08-29 NOTE — Telephone Encounter (Signed)
 Spoke to patient about her BMP since starting furosemide  20mg  daily. I would like to see her back next week for a repeat BMP and office visit to evaluate any edema and breathing. Patient verbalizes understanding and is scheduled for an office visit with me on Monday, September 02, 2023 at 9:30am.

## 2023-08-30 ENCOUNTER — Inpatient Hospital Stay

## 2023-09-02 ENCOUNTER — Ambulatory Visit (INDEPENDENT_AMBULATORY_CARE_PROVIDER_SITE_OTHER): Payer: Self-pay

## 2023-09-02 VITALS — BP 155/57 | HR 63 | Ht 66.0 in | Wt 177.2 lb

## 2023-09-02 DIAGNOSIS — I509 Heart failure, unspecified: Secondary | ICD-10-CM | POA: Diagnosis not present

## 2023-09-02 DIAGNOSIS — I10 Essential (primary) hypertension: Secondary | ICD-10-CM | POA: Diagnosis not present

## 2023-09-02 DIAGNOSIS — N1832 Chronic kidney disease, stage 3b: Secondary | ICD-10-CM

## 2023-09-02 NOTE — Assessment & Plan Note (Signed)
 Elevated in office and mildly elevated on recheck.  Patient states she does have a blood pressure monitor at home. She gets lower readings at home in the 130s and 140s systolic.  Advised to keep BP diary and bring to every visit. -Although not indicated for blood pressure control, consider increasing Imdur  30 to 60 mg daily if home blood pressures continue to rise.

## 2023-09-02 NOTE — Progress Notes (Signed)
    SUBJECTIVE:   CHIEF COMPLAINT / HPI:   [name] presents to the family medicine clinic for 1 week follow-up of CHF.  Patient was admitted to [facility] on 7 July for CHF exacerbation.  She saw me in the office for her hospital follow-up last week.  Since her hospital stay, patient has been doing much better.  She has not been using salt in her food and has decreased her water intake.  She sleeps with her feet elevated and states her pedal edema almost completely resolves overnight, and she only wakes up with very mild dorsal and lateral pedal edema that does not bother her too much.  She does report her normal DOE, but is comfortable at rest at home and in office. She denies chest pain, chest tightness, cough, urinary symptoms, flank pain, or any other complaints at this time.  She has been taking all her medications appropriately including the furosemide  20 mg daily as prescribed since the hospital visit. She does not feel she has gained any weight since the hospital stay but has not been keeping a diary of weights this time.   PERTINENT  PMH / PSH: CKD stage IIIb, chronic CHF, CAD, aortic stenosis  OBJECTIVE:   BP (!) 155/57   Pulse 63   Ht 5' 6 (1.676 m)   Wt 177 lb 3.2 oz (80.4 kg)   SpO2 100%   BMI 28.60 kg/m    General: A&O, NAD, pleasant HEENT: No sign of trauma, EOM grossly intact Cardiac: RRR, 2/6 systolic murmur Respiratory: CTAB, normal WOB, no w/c/r, good air movement throughout all lung fields GI: Soft, NTTP, non-distended  Extremities: NTTP, mild non-pitting edema to dorsal aspects of bilateral feet, very mild pitting edema to the lateral aspects of bilateral ankles without tenderness or overlying skin changes, full ROM of ankles and toes Neuro: Normal gait, moves all four extremities appropriately. Psych: Appropriate mood and affect   ASSESSMENT/PLAN:   Assessment & Plan Chronic heart failure, unspecified heart failure type [facility]) Patient doing much better  since her hospital stay.  She has been vigilant about her sodium and liquid intake.  Discussed parameters for sodium and water intake regarding her mildly reduced EF of 45 to 50% on most recent echo.  Reviewed how to read nutrition labels together in office, patient verbalizes understanding. Weight gain of 2 pounds since last visit, could be contributed to clothing, etc.  Advised to monitor with a weight diary and bring to every visit. -Eat less than 3000 mg of sodium and less than 4, 16 ounce bottles of water throughout the day.  Remember that other drinks such as Ensure, electrolyte drinks, sodas, etc. all count towards your daily liquid intake. -Keep weight, blood pressure, and diet diaries and bring them to every visit. -Continue furosemide  20 mg daily at this time. Stage 3b chronic kidney disease (HCC) BMP labs done today in office.  Will contact patient with results. Continue to monitor. -Return in 1 month to see PCP and repeat BMP. Essential hypertension Elevated in office and mildly elevated on recheck.  Patient states she does have a blood pressure monitor at home. She gets lower readings at home in the 130s and 140s systolic.  Advised to keep BP diary and bring to every visit. -Although not indicated for blood pressure control, consider increasing Imdur  30 to 60 mg daily if home blood pressures continue to rise.   Camie Dixons, DO [facility]

## 2023-09-02 NOTE — Assessment & Plan Note (Signed)
 BMP labs done today in office.  Will contact patient with results. Continue to monitor. -Return in 1 month to see PCP and repeat BMP.

## 2023-09-02 NOTE — Assessment & Plan Note (Signed)
 Patient doing much better since her hospital stay.  She has been vigilant about her sodium and liquid intake.  Discussed parameters for sodium and water intake regarding her mildly reduced EF of 45 to 50% on most recent echo.  Reviewed how to read nutrition labels together in office, patient verbalizes understanding. Weight gain of 2 pounds since last visit, could be contributed to clothing, etc.  Advised to monitor with a weight diary and bring to every visit. -Eat less than 3000 mg of sodium and less than 4, 16 ounce bottles of water throughout the day.  Remember that other drinks such as Ensure, electrolyte drinks, sodas, etc. all count towards your daily liquid intake. -Keep weight, blood pressure, and diet diaries and bring them to every visit. -Continue furosemide  20 mg daily at this time.

## 2023-09-02 NOTE — Patient Instructions (Addendum)
 It was wonderful to see you today.  Please bring ALL of your medications with you to every visit.   Today we talked about:  Your kidneys and and leg swelling since your last hospital stay and follow up in the office. I am glad you are doing so well! Keep up the good work.   You can have less than 3000mg  of salt and 4 or less 16oz water bottles throughout the day due to your congestive heart failure.   Follow up with your PCP in 3 months.  Thank you for choosing [facility].   Please call [phone] with any questions about today's appointment.  Please arrive at least 15 minutes prior to your scheduled appointments.   If you had blood work today, I will send you a MyChart message or a letter if results are normal. Otherwise, I will give you a call.   If you had a referral placed, they will call you to set up an appointment. Please give us  a call if you don't hear back in the next 2 weeks.   If you need additional refills before your next appointment, please call your pharmacy first.   You should follow up in our clinic in as needed  Camie Dixons, DO Family Medicine

## 2023-09-03 ENCOUNTER — Other Ambulatory Visit: Payer: Self-pay

## 2023-09-03 ENCOUNTER — Ambulatory Visit (INDEPENDENT_AMBULATORY_CARE_PROVIDER_SITE_OTHER)

## 2023-09-03 ENCOUNTER — Other Ambulatory Visit

## 2023-09-03 ENCOUNTER — Telehealth: Payer: Self-pay

## 2023-09-03 DIAGNOSIS — E875 Hyperkalemia: Secondary | ICD-10-CM

## 2023-09-03 LAB — BASIC METABOLIC PANEL WITH GFR
BUN/Creatinine Ratio: 19 (ref 12–28)
BUN: 30 mg/dL (ref 10–36)
CO2: 22 mmol/L (ref 20–29)
Calcium: 9.9 mg/dL (ref 8.7–10.3)
Chloride: 109 mmol/L — ABNORMAL HIGH (ref 96–106)
Creatinine, Ser: 1.62 mg/dL — ABNORMAL HIGH (ref 0.57–1.00)
Glucose: 96 mg/dL (ref 70–99)
Potassium: 5.8 mmol/L — ABNORMAL HIGH (ref 3.5–5.2)
Sodium: 146 mmol/L — ABNORMAL HIGH (ref 134–144)
eGFR: 29 mL/min/1.73 — ABNORMAL LOW (ref >59)

## 2023-09-03 NOTE — Telephone Encounter (Signed)
 Called patient to discuss elevated sodium and potassium on yesterday's labs. She states she feels well and only complains of her mild LE edema that is normal for her. She has taken all her medications this morning. She denies chest pain, dizziness, palpitations, or any other complaints. Dr. Anders and I spoke to patient together as well and asked her to come in this afternoon for a lab visit for a STAT BMP and a nurse visit for EKG. Patient verbalizes understanding and will be coming in today for these visits. Orders have been placed.

## 2023-09-03 NOTE — Progress Notes (Signed)
 Patient presents to nurse clinic for EKG.   EKG performed and provided to Dr. Anders and Dr. Lupie.   Patient then assisted to lab for BMP.   Brenda JAYSON English, RN

## 2023-09-03 NOTE — Telephone Encounter (Signed)
 EKG reviewed Similar to previous EKG from 08/20/23 with improved QTc Dr. Lupie to F/U Bmet for hyperkalemia and hypernatremia Consider Lokelma if it remains elevated ED precautions provided by Dr. Lupie.

## 2023-09-04 ENCOUNTER — Ambulatory Visit: Payer: Self-pay | Admitting: Family Medicine

## 2023-09-04 ENCOUNTER — Telehealth: Payer: Self-pay

## 2023-09-04 LAB — BASIC METABOLIC PANEL WITH GFR
BUN/Creatinine Ratio: 15 (ref 12–28)
BUN: 22 mg/dL (ref 10–36)
CO2: 21 mmol/L (ref 20–29)
Calcium: 9.9 mg/dL (ref 8.7–10.3)
Chloride: 104 mmol/L (ref 96–106)
Creatinine, Ser: 1.43 mg/dL — ABNORMAL HIGH (ref 0.57–1.00)
Glucose: 93 mg/dL (ref 70–99)
Potassium: 4.4 mmol/L (ref 3.5–5.2)
Sodium: 140 mmol/L (ref 134–144)
eGFR: 33 mL/min/1.73 — ABNORMAL LOW (ref >59)

## 2023-09-04 NOTE — Telephone Encounter (Signed)
 Spoke to patient regarding normalization of electrolytes on BMP. She states she is still doing well. Advised to see PCP in one month for follow up of CHF/edema and get BMP. She may come in sooner as needed. Patient verbalizes understanding and has made an appointment with Dr. Jerrie on 09/26/2023 at 9:30am.

## 2023-09-06 ENCOUNTER — Encounter: Payer: Self-pay | Admitting: Emergency Medicine

## 2023-09-06 ENCOUNTER — Ambulatory Visit: Attending: Emergency Medicine | Admitting: Emergency Medicine

## 2023-09-06 VITALS — BP 136/68 | HR 57 | Ht 66.0 in | Wt 178.0 lb

## 2023-09-06 DIAGNOSIS — I2510 Atherosclerotic heart disease of native coronary artery without angina pectoris: Secondary | ICD-10-CM | POA: Diagnosis not present

## 2023-09-06 DIAGNOSIS — I5022 Chronic systolic (congestive) heart failure: Secondary | ICD-10-CM

## 2023-09-06 DIAGNOSIS — I10 Essential (primary) hypertension: Secondary | ICD-10-CM | POA: Diagnosis not present

## 2023-09-06 DIAGNOSIS — Z79899 Other long term (current) drug therapy: Secondary | ICD-10-CM

## 2023-09-06 DIAGNOSIS — I4729 Other ventricular tachycardia: Secondary | ICD-10-CM

## 2023-09-06 DIAGNOSIS — I350 Nonrheumatic aortic (valve) stenosis: Secondary | ICD-10-CM

## 2023-09-06 DIAGNOSIS — N1832 Chronic kidney disease, stage 3b: Secondary | ICD-10-CM

## 2023-09-06 MED ORDER — FUROSEMIDE 20 MG PO TABS
20.0000 mg | ORAL_TABLET | Freq: Every day | ORAL | 1 refills | Status: AC
Start: 2023-09-06 — End: ?

## 2023-09-06 NOTE — Progress Notes (Signed)
 Cardiology Office Note:    Date:  09/06/2023  ID:  ZAELYN NOACK, DOB [birth year], MRN [id number] PCP: Jerrie Gathers, DO  [facility] Providers Cardiologist:  Lynwood Schilling, MD Cardiology APP:  Rana Lum CROME, NP       Patient Profile:       Chief Complaint: Hospital follow-up History of Present Illness:  [name] is a 89 y.o. female with visit-pertinent history of chronic HFpEF, hypertension, CKD, mild AS, history of GI bleed, minimal CAD per cath in 2003, brief NSVT  Cardiac catheterization in 2010 which showed nonobstructive disease.  She was admitted 09/2017 with chest for medical management due to age and kidney disease.  She had mild aortic stenosis with mean gradient of 11 mmHg by echo 09/2017.  Carotid duplex 09/2018 with bilateral 1-39% stenosis.  Echo 09/2019 LVEF 60 to 65%, grade 1 diastolic dysfunction, mild aortic stenosis with mean gradient 13.2 mmHg.  Previous long-term monitor 09/2019 showed sinus rhythm with 4 beat run of slow ventricular tachycardia rate 94 bpm which was asymptomatic.   Patient was admitted on 08/19/2023 for acute on chronic HFpEF with newly reduced EF.  She presented to the ED on 7/7 complaining of leg swelling.  She reported experience chronic edema since being hospitalized in August 2024 and has been taking p.o. Lasix  since then.  She also reports worsening DOE and acute on chronic chest discomfort.  Her BNP was 478 and chest x-ray showed no active disease.  Echocardiogram showed LVEF 45-50%, severe LVH, normal RV, BAE, mild MR, mild AS, dilated IVC.  When compared to echo from 09/2022 EF was 60 to 65%, mild LVH otherwise fairly similar.  EKG showed sinus bradycardia with first-degree AV block and LBBB.  She was started on IV Lasix .  Per patient report she was taking her p.o. Lasix  at home every other day and at some point began taking it only as needed.  Patient stated given her age she does not wish to add any more medications.  She is followed  closely by palliative medicine and she is currently a DNR.  No invasive testing is indicated and she was managed medically.  She was switched to p.o. Lasix  daily.  She was ultimately started on Imdur  30 mg daily.   Discussed the use of AI scribe software for clinical note transcription with the patient, who gave verbal consent to proceed.  History of Present Illness FLORIDE [name] is a 89 year old female who presents today after hospital follow-up.  Today patient is doing well overall.  She is without acute complaints today.  She tells me her chest pains have resolved now that she is taking Imdur .  Her dyspnea on exertion is unchanged.  She is no longer experiencing any orthopnea and denies any PND.  She reports that her leg swelling continues which is what is bothering her the most.  She elevates her legs at night which improves her leg swelling by morning.  Her legs began to swell throughout the day.  She does not have compression stockings and reports she would be willing to start wearing them however she does not have access taking any at this time.  Interestingly she did tell me she only has a minimal urination at home despite frequent urination in the hospital.  She did bring her medications in today and it looks like she does not have a bottle of Lasix  therefore she has not been taking it.  She reports she is not sure  how long she has not been taking her Lasix  at home.  She denies any lightheadedness, dizziness, palpitations, syncope, presyncope.  Review of systems:  Please see the history of present illness. All other systems are reviewed and otherwise negative.      Studies Reviewed:        Echocardiogram 08/20/2023  1. Left ventricular ejection fraction, by estimation, is 45 to 50%. The  left ventricle has mildly decreased function. The left ventricle  demonstrates global hypokinesis. There is severe concentric left  ventricular hypertrophy. Left ventricular diastolic   function  could not be evaluated.   2. Right ventricular systolic function is normal. The right ventricular  size is normal.   3. Left atrial size was severely dilated.   4. Right atrial size was severely dilated.   5. The mitral valve is degenerative. Mild mitral valve regurgitation. The  mean mitral valve gradient is 2.0 mmHg with average heart rate of 52 bpm.  Moderate mitral annular calcification.   6. The aortic valve was not well visualized. Aortic valve regurgitation  is not visualized. Mild aortic valve stenosis. Aortic valve area, by VTI  measures 1.92 cm. Aortic valve Vmax measures 2.37 m/s.   7. The inferior vena cava is dilated in size with <50% respiratory  variability, suggesting right atrial pressure of 15 mmHg.   Echocardiogram 10/09/2022 1. Left ventricular ejection fraction, by estimation, is 60 to 65%. The  left ventricle has normal function. The left ventricle has no regional  wall motion abnormalities. There is mild concentric left ventricular  hypertrophy. Left ventricular diastolic  parameters are consistent with Grade III diastolic dysfunction  (restrictive).   2. Right ventricular systolic function is normal. The right ventricular  size is normal. Moderately increased right ventricular wall thickness.  There is severely elevated pulmonary artery systolic pressure. The  estimated right ventricular systolic  pressure is 72.8 mmHg.   3. Left atrial size was severely dilated.   4. Right atrial size was severely dilated.   5. The mitral valve is normal in structure. Mild to moderate mitral valve  regurgitation. No evidence of mitral stenosis.   6. Tricuspid valve regurgitation is moderate.   7. The aortic valve is tricuspid. There is mild calcification of the  aortic valve. Aortic valve regurgitation is not visualized. Moderate  aortic valve stenosis. Aortic valve area, by VTI measures 1.13 cm. Aortic  valve mean gradient measures 14.8 mmHg.  Aortic valve Vmax measures  2.55 m/s.   8. The inferior vena cava is dilated in size with <50% respiratory  variability, suggesting right atrial pressure of 15 mmHg.  ZIO 11/12/2019 Sinus rhythm including NSR and sinus bradycardia Slowest HR recorded was 34 at 2:48 PMnon Sept. 21, 2021 She had a 4 beat run of slow ventricular tachycardia ( HR of 104 ) which was not symptomatic  Carotid duplex 09/18/2019 Right Carotid: Velocities in the right ICA are consistent with a 1-39%  stenosis.   Left Carotid: Velocities in the left ICA are consistent with a 1-39%  stenosis.   Vertebrals: Bilateral vertebral arteries demonstrate antegrade flow.  Risk Assessment/Calculations:              Physical Exam:   VS:  BP 136/68 (BP Location: Left Arm, Patient Position: Sitting, Cuff Size: Large)   Pulse (!) 57   Ht 5' 6 (1.676 m)   Wt 178 lb (80.7 kg)   BMI 28.73 kg/m    Wt Readings from Last 3 Encounters:  09/06/23 178  lb (80.7 kg)  09/02/23 177 lb 3.2 oz (80.4 kg)  08/26/23 175 lb (79.4 kg)    GEN: Well nourished, well developed in no acute distress NECK: No JVD; No carotid bruits CARDIAC: RRR.  2/6 systolic murmur.  Not rubs, gallops RESPIRATORY:  Clear to auscultation without rales, wheezing or rhonchi  ABDOMEN: Soft, non-tender, non-distended EXTREMITIES: 2+ pedal edema bilaterally no acute deformity      Assessment and Plan:  Chronic HFmrEF Echocardiogram 09/2022 LVEF 60 to 65%, no RWMA, mild LVH, grade 3 DD Echocardiogram 08/2023 with LVEF 45 to 50%, global hypokinesis, severe LVH Admitted 08/2023 for acute on chronic HF with newly reduced EF with net -3.2L - Today she is euvolemic.  Weight has been stable.  Chronic DOE has been stable and improving.  No orthopnea or PND.  Lower extremity edema persist which is most bothersome for her - Patient brought her home medications in the office today which I reviewed.  She did not have Lasix  as a part of her daily regimen.  She is unsure how long she has been without  Lasix  but at least several months prior to recent admission.  The lack of loop diuretic therapy likely led to her recent exacerbation - Plan to restart Lasix  20 mg daily.  She will pick this up from pharmacy today - Patient prefers not to add on more medications at this time as she feels she has been improving, noted she follows closely with palliative medicine - Continue current GDMT of Lasix  20 mg daily and metoprolol  XL 12.5 mg daily - Consider repeat echocardiogram at follow-up visit to reassess LV function - BMET x 2 weeks  Mild aortic valve stenosis Mild mitral valve regurgitation Echocardiogram 08/2023 showed mild mitral valve regurgitation and aortic valve stenosis - No chest pain or syncope today - There is no indication for intervention at this time  Coronary artery disease Nonobstructive by cardiac catheterization in 2010 She has been managed medically for angina since 2019 due to advanced age and chronic kidney disease During recent admission she did mention chest pains and started on Imdur  - Today her chest pains have entirely resolved.  She is without anginal symptoms, no indication for intervention at this time - She is not on aspirin  due to history of GI bleed and not on lipid-lowering therapy - Continue Imdur  30 mg daily  Hypertension Blood pressure today is 136/68 and well-controlled - Continue metoprolol  XL 12.5 mg daily - Continue home BP monitoring  NSVT ZIO 04/2019 showed 4 beat run of slow ventricular tachycardia (HR 104) - Today she denies palpitations, syncope, tachycardia - Continue metoprolol  XL 12.5 mg daily  Chronic kidney disease 3b Creatinine 1.43, GFR 33 on 09/03/2023 - Remains stable and improving      Dispo:  Return in about 3 months (around 12/07/2023).  Signed, Lum LITTIE Louis, NP

## 2023-09-06 NOTE — Patient Instructions (Signed)
 Medication Instructions:  START BACK TAKING YOUR LASIX  20 MG DAILY.  CONTINUE ALL OTHER MEDICATION THERAPY.   Lab Work: Nutritional therapist TO BE DONE IN TWO WEEKS.   Testing/Procedures: NONE  Follow-Up: At [facility], you and your health needs are our priority.  As part of our continuing mission to provide you with exceptional heart care, our providers are all part of one team.  This team includes your primary Cardiologist (physician) and Advanced Practice Providers or APPs (Physician Assistants and Nurse Practitioners) who all work together to provide you with the care you need, when you need it.  Your next appointment:   3 MONTHS  Provider:   MADISON FOUNTAIN, DNP   We recommend signing up for the patient portal called MyChart.  Sign up information is provided on this After Visit Summary.  MyChart is used to connect with patients for Virtual Visits (Telemedicine).  Patients are able to view lab/test results, encounter notes, upcoming appointments, etc.  Non-urgent messages can be sent to your provider as well.   To learn more about what you can do with MyChart, go to [email or web address].

## 2023-09-11 ENCOUNTER — Telehealth: Payer: Self-pay | Admitting: Cardiovascular Disease

## 2023-09-11 NOTE — Telephone Encounter (Signed)
 Pt c/o medication issue:  1. Name of Medication:   furosemide  (LASIX ) 20 MG tablet   2. How are you currently taking this medication (dosage and times per day)?   Patient stopped taking this medication  3. Are you having a reaction (difficulty breathing--STAT)?   4. What is your medication issue?   Patient stated her doctor stopped her taking this medication and patient wants to get alternate medication.

## 2023-09-11 NOTE — Telephone Encounter (Signed)
 Returned call to patient.Phone rings busy.

## 2023-09-12 NOTE — Telephone Encounter (Signed)
 Spoke to patient stated she was suppose to take Lasix  20 mg daily.Stated she went to her pharmacy to pick up and they told her too early to get prescription.While reviewing medications with patient she does have Furosemide  20 mg.Advised Furosemide  is generic for Lasix .Advised to take Furosemide  20 mg every morning.She will get bmet as told.

## 2023-09-20 LAB — BASIC METABOLIC PANEL WITH GFR
BUN/Creatinine Ratio: 16 (ref 12–28)
BUN: 22 mg/dL (ref 10–36)
CO2: 23 mmol/L (ref 20–29)
Calcium: 9.9 mg/dL (ref 8.7–10.3)
Chloride: 102 mmol/L (ref 96–106)
Creatinine, Ser: 1.41 mg/dL — ABNORMAL HIGH (ref 0.57–1.00)
Glucose: 95 mg/dL (ref 70–99)
Potassium: 4.1 mmol/L (ref 3.5–5.2)
Sodium: 143 mmol/L (ref 134–144)
eGFR: 34 mL/min/1.73 — ABNORMAL LOW (ref >59)

## 2023-09-22 ENCOUNTER — Ambulatory Visit: Payer: Self-pay | Admitting: Cardiology

## 2023-09-26 ENCOUNTER — Ambulatory Visit (INDEPENDENT_AMBULATORY_CARE_PROVIDER_SITE_OTHER): Payer: Self-pay

## 2023-09-26 VITALS — BP 158/78 | HR 58 | Ht 66.0 in | Wt 176.8 lb

## 2023-09-26 DIAGNOSIS — N1832 Chronic kidney disease, stage 3b: Secondary | ICD-10-CM | POA: Diagnosis not present

## 2023-09-26 DIAGNOSIS — Z0000 Encounter for general adult medical examination without abnormal findings: Secondary | ICD-10-CM

## 2023-09-26 DIAGNOSIS — I10 Essential (primary) hypertension: Secondary | ICD-10-CM

## 2023-09-26 DIAGNOSIS — R42 Dizziness and giddiness: Secondary | ICD-10-CM

## 2023-09-26 DIAGNOSIS — I509 Heart failure, unspecified: Secondary | ICD-10-CM | POA: Diagnosis not present

## 2023-09-26 HISTORY — DX: Encounter for general adult medical examination without abnormal findings: Z00.00

## 2023-09-26 NOTE — Assessment & Plan Note (Signed)
 BMP recently completed on 8/8 by cardiology with improving creatinine (1.41) and eGFR (34). Will not repeat labs today.

## 2023-09-26 NOTE — Assessment & Plan Note (Signed)
 Annual healthcare visit upcoming in September. Patient states she previously tried to get Shingrix  and had paperwork to fill out. Encouraged patient to bring paperwork to office if she needs help. - Provided counseling on receiving Shingrix  and Tdap vaccines at outside pharmacy.

## 2023-09-26 NOTE — Patient Instructions (Addendum)
 Thank you for visiting the clinic today, it was good to see you!  Please always bring your medication bottles  In today's visit we discussed: - Getting your Shingrix  and Tdap vaccinations at the pharmacy - Physical therapy referral  Please follow-up on 9/4 at 3:20pm for your annual visit.  For any questions, please call the office at [phone] or send me a message in MyChart. Have a great day!  Esme Durkin, DO [facility] Resident, PGY-1

## 2023-09-26 NOTE — Progress Notes (Signed)
    SUBJECTIVE:   CHIEF COMPLAINT / HPI:   Patient presenting for follow up of CHF/edema. Reports lightheadedness and feeling wobbly at times. Reports improving edema since starting Lasix  20mg . DOE is still present but comfortable at rest. Weight is stable, ranging from 174-179 lbs.  PERTINENT  PMH / PSH: HFpEF, CKD Stage 3b  OBJECTIVE:   BP (!) 158/78 Comment: Standing  Pulse (!) 58   Ht 5' 6 (1.676 m)   Wt 176 lb 12.8 oz (80.2 kg)   SpO2 99%   BMI 28.54 kg/m    General: Alert, well-appearing female in NAD.  HEENT: Normocephalic, atraumatic. EOMI. Cardiovascular: RRR, 2/6 systolic murmur. Femoral/radial pulse +2 bilaterally Pulmonary: Normal work of breathing. CTAB with no wheezes or crackles present Extremities: Warm and well-perfused, 1+ pitting edema in bilateral LE. No overlying skin changes.  Neurologic: Normal gait with cane, moves all four extremities appropriately.  Psych: Mood and affect are appropriate.  ASSESSMENT/PLAN:   Assessment & Plan Essential hypertension Initial BP 154/70, repeat 148/71. Diastolic low and patient complaining of lightheadedness and unsteadiness so got standing BP 158/78. Will move forward with physical therapy referral for now to see if this improves sx and prevent falls. - Continue current blood pressure regimen. - Referral to PT for help with lightheadedness and unsteadiness Stage 3b chronic kidney disease (HCC) BMP recently completed on 8/8 by cardiology with improving creatinine (1.41) and eGFR (34). Will not repeat labs today. Chronic heart failure, unspecified heart failure type (HCC) LE edema is improving. DOE still present but improving and comfortable at rest. Followed by cardiology. - Continue all meds as discussed with cardiology Routine adult health maintenance Annual healthcare visit upcoming in September. Patient states she previously tried to get Shingrix  and had paperwork to fill out. Encouraged patient to bring paperwork  to office if she needs help. - Provided counseling on receiving Shingrix  and Tdap vaccines at outside pharmacy.   Ladarrell Cornwall, DO [facility]

## 2023-09-26 NOTE — Assessment & Plan Note (Signed)
 Initial BP 154/70, repeat 148/71. Diastolic low and patient complaining of lightheadedness and unsteadiness so got standing BP 158/78. Will move forward with physical therapy referral for now to see if this improves sx and prevent falls. - Continue current blood pressure regimen. - Referral to PT for help with lightheadedness and unsteadiness

## 2023-09-26 NOTE — Assessment & Plan Note (Signed)
 LE edema is improving. DOE still present but improving and comfortable at rest. Followed by cardiology. - Continue all meds as discussed with cardiology

## 2023-10-01 ENCOUNTER — Telehealth: Payer: Self-pay

## 2023-10-01 NOTE — Telephone Encounter (Signed)
 Patient calls nurse line in regards to tetanus and shingles vaccines.   She reports reservations with getting these vaccines at the pharmacy. She is fearful for a reaction.   I advised she would not be able to get them here, due to stock issues (we do not carry shingles) and insurance (tdap would be out of pocket.)   Advised to share her concerns with the pharmacist who is administering the vaccine for reassurance.   She reports she will give us  a call if she has any further questions.   She was appreciative of time.

## 2023-10-17 ENCOUNTER — Ambulatory Visit: Payer: 59 | Admitting: *Deleted

## 2023-10-17 VITALS — Ht 63.0 in | Wt 175.0 lb

## 2023-10-17 DIAGNOSIS — Z0000 Encounter for general adult medical examination without abnormal findings: Secondary | ICD-10-CM

## 2023-10-17 NOTE — Patient Instructions (Signed)
 [name] , Thank you for taking time to come for your Medicare Wellness Visit. I appreciate your ongoing commitment to your health goals. Please review the following plan we discussed and let me know if I can assist you in the future.   Screening recommendations/referrals: Colonoscopy:  Mammogram:  Bone Density:  Recommended yearly ophthalmology/optometry visit for glaucoma screening and checkup Recommended yearly dental visit for hygiene and checkup  Vaccinations: Influenza vaccine:  Pneumococcal vaccine:  Tdap vaccine:  Shingles vaccine:         Preventive Care 65 Years and Older, Female Preventive care refers to lifestyle choices and visits with your health care provider that can promote health and wellness. What does preventive care include? A yearly physical exam. This is also called an annual well check. Dental exams once or twice a year. Routine eye exams. Ask your health care provider how often you should have your eyes checked. Personal lifestyle choices, including: Daily care of your teeth and gums. Regular physical activity. Eating a healthy diet. Avoiding tobacco and drug use. Limiting alcohol use. Practicing safe sex. Taking low-dose aspirin  every day. Taking vitamin and mineral supplements as recommended by your health care provider. What happens during an annual well check? The services and screenings done by your health care provider during your annual well check will depend on your age, overall health, lifestyle risk factors, and family history of disease. Counseling  Your health care provider may ask you questions about your: Alcohol use. Tobacco use. Drug use. Emotional well-being. Home and relationship well-being. Sexual activity. Eating habits. History of falls. Memory and ability to understand (cognition). Work and work Astronomer. Reproductive health. Screening  You may have the following tests or measurements: Height, weight, and  BMI. Blood pressure. Lipid and cholesterol levels. These may be checked every 5 years, or more frequently if you are over 1 years old. Skin check. Lung cancer screening. You may have this screening every year starting at age 80 if you have a 30-pack-year history of smoking and currently smoke or have quit within the past 15 years. Fecal occult blood test (FOBT) of the stool. You may have this test every year starting at age 48. Flexible sigmoidoscopy or colonoscopy. You may have a sigmoidoscopy every 5 years or a colonoscopy every 10 years starting at age 12. Hepatitis C blood test. Hepatitis B blood test. Sexually transmitted disease (STD) testing. Diabetes screening. This is done by checking your blood sugar (glucose) after you have not eaten for a while (fasting). You may have this done every 1-3 years. Bone density scan. This is done to screen for osteoporosis. You may have this done starting at age 25. Mammogram. This may be done every 1-2 years. Talk to your health care provider about how often you should have regular mammograms. Talk with your health care provider about your test results, treatment options, and if necessary, the need for more tests. Vaccines  Your health care provider may recommend certain vaccines, such as: Influenza vaccine. This is recommended every year. Tetanus, diphtheria, and acellular pertussis (Tdap, Td) vaccine. You may need a Td booster every 10 years. Zoster vaccine. You may need this after age 77. Pneumococcal 13-valent conjugate (PCV13) vaccine. One dose is recommended after age 70. Pneumococcal polysaccharide (PPSV23) vaccine. One dose is recommended after age 50. Talk to your health care provider about which screenings and vaccines you need and how often you need them. This information is not intended to replace advice given to you by your  health care provider. Make sure you discuss any questions you have with your health care provider. Document  Released: 02/25/2015 Document Revised: 10/19/2015 Document Reviewed: 11/30/2014 Elsevier Interactive Patient Education  2017 [employer].  Fall Prevention in the Home Falls can cause injuries. They can happen to people of all ages. There are many things you can do to make your home safe and to help prevent falls. What can I do on the outside of my home? Regularly fix the edges of walkways and driveways and fix any cracks. Remove anything that might make you trip as you walk through a door, such as a raised step or threshold. Trim any bushes or trees on the path to your home. Use bright outdoor lighting. Clear any walking paths of anything that might make someone trip, such as rocks or tools. Regularly check to see if handrails are loose or broken. Make sure that both sides of any steps have handrails. Any raised decks and porches should have guardrails on the edges. Have any leaves, snow, or ice cleared regularly. Use sand or salt on walking paths during winter. Clean up any spills in your garage right away. This includes oil or grease spills. What can I do in the bathroom? Use night lights. Install grab bars by the toilet and in the tub and shower. Do not use towel bars as grab bars. Use non-skid mats or decals in the tub or shower. If you need to sit down in the shower, use a plastic, non-slip stool. Keep the floor dry. Clean up any water that spills on the floor as soon as it happens. Remove soap buildup in the tub or shower regularly. Attach bath mats securely with double-sided non-slip rug tape. Do not have throw rugs and other things on the floor that can make you trip. What can I do in the bedroom? Use night lights. Make sure that you have a light by your bed that is easy to reach. Do not use any sheets or blankets that are too big for your bed. They should not hang down onto the floor. Have a firm chair that has side arms. You can use this for support while you get dressed. Do  not have throw rugs and other things on the floor that can make you trip. What can I do in the kitchen? Clean up any spills right away. Avoid walking on wet floors. Keep items that you use a lot in easy-to-reach places. If you need to reach something above you, use a strong step stool that has a grab bar. Keep electrical cords out of the way. Do not use floor polish or wax that makes floors slippery. If you must use wax, use non-skid floor wax. Do not have throw rugs and other things on the floor that can make you trip. What can I do with my stairs? Do not leave any items on the stairs. Make sure that there are handrails on both sides of the stairs and use them. Fix handrails that are broken or loose. Make sure that handrails are as long as the stairways. Check any carpeting to make sure that it is firmly attached to the stairs. Fix any carpet that is loose or worn. Avoid having throw rugs at the top or bottom of the stairs. If you do have throw rugs, attach them to the floor with carpet tape. Make sure that you have a light switch at the top of the stairs and the bottom of the stairs. If you do  not have them, ask someone to add them for you. What else can I do to help prevent falls? Wear shoes that: Do not have high heels. Have rubber bottoms. Are comfortable and fit you well. Are closed at the toe. Do not wear sandals. If you use a stepladder: Make sure that it is fully opened. Do not climb a closed stepladder. Make sure that both sides of the stepladder are locked into place. Ask someone to hold it for you, if possible. Clearly mark and make sure that you can see: Any grab bars or handrails. First and last steps. Where the edge of each step is. Use tools that help you move around (mobility aids) if they are needed. These include: Canes. Walkers. Scooters. Crutches. Turn on the lights when you go into a dark area. Replace any light bulbs as soon as they burn out. Set up your  furniture so you have a clear path. Avoid moving your furniture around. If any of your floors are uneven, fix them. If there are any pets around you, be aware of where they are. Review your medicines with your doctor. Some medicines can make you feel dizzy. This can increase your chance of falling. Ask your doctor what other things that you can do to help prevent falls. This information is not intended to replace advice given to you by your health care provider. Make sure you discuss any questions you have with your health care provider. Document Released: 11/25/2008 Document Revised: 07/07/2015 Document Reviewed: 03/05/2014 Elsevier Interactive Patient Education  2017 [employer].

## 2023-10-17 NOTE — Progress Notes (Signed)
 Subjective:   [name] is a 89 y.o. female who presents for Medicare Annual (Subsequent) preventive examination.  Visit Complete: Virtual I connected with  [name] on 10/17/23 by a audio enabled telemedicine application and verified that I am speaking with the correct person using two identifiers.  Patient Location: Home  Provider Location: Home Office  I discussed the limitations of evaluation and management by telemedicine. The patient expressed understanding and agreed to proceed.  Vital Signs: Because this visit was a virtual/telehealth visit, some criteria may be missing or patient reported. Any vitals not documented were not able to be obtained and vitals that have been documented are patient reported.  Cardiac Risk Factors include: advanced age (>73men, >21 women);hypertension;obesity (BMI >30kg/m2)     Objective:    Today's Vitals   10/17/23 1537  Weight: 175 lb (79.4 kg)  Height: 5' 3 (1.6 m)   Body mass index is 31 kg/m.     10/17/2023    3:33 PM 09/26/2023    9:20 AM 09/02/2023    8:26 AM 08/19/2023    4:12 PM 03/25/2023    2:26 PM 03/19/2023    8:16 AM 03/07/2023    2:36 PM  Advanced Directives  Does Patient Have a Medical Advance Directive?  No No No No No No  Would patient like information on creating a medical advance directive? No - Patient declined No - Patient declined  No - Patient declined No - Patient declined No - Patient declined No - Patient declined    Current Medications (verified) Outpatient Encounter Medications as of 10/17/2023  Medication Sig   acetaminophen  (TYLENOL ) 500 MG tablet Take 1,000 mg by mouth every 6 (six) hours.   albuterol  (VENTOLIN  HFA) 108 (90 Base) MCG/ACT inhaler Inhale 1-2 puffs into the lungs every 6 (six) hours as needed for wheezing or shortness of breath.   Ascorbic Acid (VITAMIN C) 1000 MG tablet Take 1,000 mg by mouth daily.   calcium  carbonate (TUMS EX) 750 MG chewable tablet Chew 1 tablet by mouth as needed  for heartburn.   Calcium  Carbonate Antacid (GAVISCON ACID BRKTHRGH FORMULA PO) Take 1 tablet by mouth as needed (Indigestion).   Cholecalciferol  (VITAMIN D3) 2000 units TABS Take 2,000 Units by mouth daily.   diclofenac  sodium (VOLTAREN ) 1 % GEL Apply 2 g topically 2 (two) times daily as needed (pain).   ferrous sulfate  325 (65 FE) MG EC tablet Take 1 tablet (325 mg total) by mouth daily with breakfast.   furosemide  (LASIX ) 20 MG tablet Take 1 tablet (20 mg total) by mouth daily.   isosorbide  mononitrate (IMDUR ) 30 MG 24 hr tablet Take 1 tablet (30 mg total) by mouth daily.   metoprolol  succinate (TOPROL -XL) 25 MG 24 hr tablet Take 0.5 tablets (12.5 mg total) by mouth daily.   Multiple Vitamins-Minerals (CENTRUM SILVER WOMEN 50+ PO) Take 1 tablet by mouth daily.   nitroGLYCERIN  (NITROSTAT ) 0.4 MG SL tablet Place 1 tablet (0.4 mg total) under the tongue every 5 (five) minutes as needed for chest pain.   polyvinyl alcohol (LIQUIFILM TEARS) 1.4 % ophthalmic solution Place 1 drop into both eyes as needed for dry eyes.   No facility-administered encounter medications on file as of 10/17/2023.    Allergies (verified) Ezetimibe, Lipitor [atorvastatin], Pravachol [pravastatin], Statins, Zocor [simvastatin], and Cholestyramine   History: Past Medical History:  Diagnosis Date   Anemia    takes Ferrous Sulfate  daily   Aortic stenosis    mild AS pk  grad 18, mean grad 9, AVA 1.32 cm 03/2015 echo (Dr. Ladona)   Arthritis    Chronic kidney disease (CKD), stage III (moderate) (HCC)    Diverticulitis    Hematochezia 03/2015   High cholesterol    can't take the meds   History of blood transfusion 03/2015   no abnormal reaction noted   History of GI diverticular bleed    HTN (hypertension)    takes Losartan -HCTZ and Metoprolol  daily   Osteoarthritis of left hip 05/17/2015   Past Surgical History:  Procedure Laterality Date   ABCESS DRAINAGE     abdominal abcess   cataract surgery Bilateral     CHOLECYSTECTOMY     DILATION AND CURETTAGE OF UTERUS     ESOPHAGOGASTRODUODENOSCOPY N/A 02/23/2015   Procedure: ESOPHAGOGASTRODUODENOSCOPY (EGD);  Surgeon: Lynwood Bohr, MD;  Location: [facility] ENDOSCOPY;  Service: Endoscopy;  Laterality: N/A;   FLEXIBLE SIGMOIDOSCOPY N/A 02/24/2015   Procedure: FLEXIBLE SIGMOIDOSCOPY;  Surgeon: Lynwood Bohr, MD;  Location: [facility] ENDOSCOPY;  Service: Endoscopy;  Laterality: N/A;   IR ANGIOGRAM SELECTIVE EACH ADDITIONAL VESSEL  03/06/2017   IR ANGIOGRAM SELECTIVE EACH ADDITIONAL VESSEL  03/06/2017   IR ANGIOGRAM SELECTIVE EACH ADDITIONAL VESSEL  03/06/2017   IR ANGIOGRAM SELECTIVE EACH ADDITIONAL VESSEL  03/08/2017   IR ANGIOGRAM VISCERAL SELECTIVE  03/06/2017   IR ANGIOGRAM VISCERAL SELECTIVE  03/06/2017   IR ANGIOGRAM VISCERAL SELECTIVE  03/06/2017   IR ANGIOGRAM VISCERAL SELECTIVE  03/08/2017   IR ANGIOGRAM VISCERAL SELECTIVE  03/08/2017   IR ANGIOGRAM VISCERAL SELECTIVE  09/20/2022   IR EMBO ART  VEN HEMORR LYMPH EXTRAV  INC GUIDE ROADMAPPING  03/06/2017   IR EMBO ART  VEN HEMORR LYMPH EXTRAV  INC GUIDE ROADMAPPING  03/08/2017   IR EMBO ART  VEN HEMORR LYMPH EXTRAV  INC GUIDE ROADMAPPING  09/20/2022   IR FLUORO GUIDE CV LINE LEFT  09/20/2022   IR US  GUIDE VASC ACCESS LEFT  09/20/2022   IR US  GUIDE VASC ACCESS RIGHT  03/06/2017   IR US  GUIDE VASC ACCESS RIGHT  03/08/2017   IR US  GUIDE VASC ACCESS RIGHT  09/20/2022   left knee surgery     TOTAL HIP ARTHROPLASTY Left 05/17/2015   Procedure: LEFT TOTAL HIP ARTHROPLASTY ANTERIOR APPROACH;  Surgeon: Lonni CINDERELLA Poli, MD;  Location: [facility] OR;  Service: Orthopedics;  Laterality: Left;   Family History  Problem Relation Age of Onset   Diabetes Other    Hypertension Other    Cancer Other    Heart disease Other    Social History   Socioeconomic History   Marital status: Widowed    Spouse name: Not on file   Number of children: Not on file   Years of education: Not on file   Highest education level: Not on file  Occupational  History   Not on file  Tobacco Use   Smoking status: Former    Passive exposure: Past   Smokeless tobacco: Never   Tobacco comments:    quit smoking 8+ yrs ago  Vaping Use   Vaping status: Never Used  Substance and Sexual Activity   Alcohol use: No    Comment: quit yrs ago   Drug use: No   Sexual activity: Not Currently  Other Topics Concern   Not on file  Social History Narrative   Not on file   Social Drivers of Health   Financial Resource Strain: Low Risk  (10/17/2023)   Overall Financial Resource Strain (CARDIA)    Difficulty of  Paying Living Expenses: Not hard at all  Food Insecurity: No Food Insecurity (10/17/2023)   Hunger Vital Sign    Worried About Running Out of Food in the Last Year: Never true    Ran Out of Food in the Last Year: Never true  Transportation Needs: No Transportation Needs (10/17/2023)   PRAPARE - Administrator, Civil Service (Medical): No    Lack of Transportation (Non-Medical): No  Physical Activity: Insufficiently Active (10/17/2023)   Exercise Vital Sign    Days of Exercise per Week: 4 days    Minutes of Exercise per Session: 10 min  Stress: No Stress Concern Present (10/17/2023)   [employer] of Occupational Health - Occupational Stress Questionnaire    Feeling of Stress: Not at all  Social Connections: Moderately Isolated (10/17/2023)   Social Connection and Isolation Panel    Frequency of Communication with Friends and Family: Three times a week    Frequency of Social Gatherings with Friends and Family: Twice a week    Attends Religious Services: 1 to 4 times per year    Active Member of [employer] or Organizations: No    Attends Banker Meetings: Never    Marital Status: Widowed    Tobacco Counseling Counseling given: Not Answered Tobacco comments: quit smoking 8+ yrs ago   Clinical Intake:  Pre-visit preparation completed: Yes  Pain : No/denies pain     Diabetes: No  How often do you need to have someone  help you when you read instructions, pamphlets, or other written materials from your doctor or pharmacy?: 1 - Never  Interpreter Needed?: No  Information entered by :: Mliss Graff LPN   Activities of Daily Living    10/17/2023    3:36 PM 08/19/2023    3:38 PM  In your present state of health, do you have any difficulty performing the following activities:  Hearing?  1  Vision? 1 0  Difficulty concentrating or making decisions? 0 0  Walking or climbing stairs? 1   Dressing or bathing? 0   Doing errands, shopping? 0   Preparing Food and eating ? N   Using the Toilet? N   In the past six months, have you accidently leaked urine? N   Do you have problems with loss of bowel control? N   Managing your Medications? N   Managing your Finances? N   Housekeeping or managing your Housekeeping? N     Patient Care Team: Bhagat, Virali, DO as PCP - General (Family Medicine) Lavona Agent, MD as PCP - Cardiology (Cardiology) Rana Lum CROME, NP as Nurse Practitioner (Cardiology)  Indicate any recent Medical Services you may have received from other than [facility] providers in the past year (date may be approximate).     Assessment:   This is a routine wellness examination for Lorrena.  Hearing/Vision screen Hearing Screening - Comments:: Hearing aids bilateral Vision Screening - Comments:: Up to date On [facility]  Unsure of name   Goals Addressed             This Visit's Progress    Patient Stated       Remain independent     Remain active and independent   On track      Depression Screen    10/17/2023    3:41 PM 09/26/2023    9:17 AM 09/02/2023    8:46 AM 05/14/2023    1:30 PM 03/25/2023    2:26 PM 03/19/2023    8:15  AM 03/07/2023    2:36 PM  PHQ 2/9 Scores  PHQ - 2 Score 0  1 0 1 0 0  PHQ- 9 Score 1  2 1 1 2 1   Exception Documentation  Patient refusal         Fall Risk    10/17/2023    3:33 PM 09/26/2023    9:17 AM 09/02/2023    8:26 AM 05/14/2023    1:26 PM  03/25/2023    2:26 PM  Fall Risk   Falls in the past year? 0 0 0 0 0  Number falls in past yr: 0 0  0 0  Injury with Fall? 0 0  0 0  Risk for fall due to : Impaired balance/gait    No Fall Risks  Follow up Falls evaluation completed;Education provided;Falls prevention discussed    Falls evaluation completed    MEDICARE RISK AT HOME: Medicare Risk at Home Any stairs in or around the home?: No If so, are there any without handrails?: No Home free of loose throw rugs in walkways, pet beds, electrical cords, etc?: Yes Adequate lighting in your home to reduce risk of falls?: Yes Life alert?: No Use of a cane, walker or w/c?: No Grab bars in the bathroom?: Yes Shower chair or bench in shower?: Yes Elevated toilet seat or a handicapped toilet?: Yes  TIMED UP AND GO:  Was the test performed?  No    Cognitive Function:        10/17/2023    3:37 PM 10/10/2022    2:03 PM  6CIT Screen  What Year? 4 points 0 points  What month? 0 points 0 points  What time? 0 points 0 points  Count back from 20 0 points 0 points  Months in reverse 2 points 2 points  Repeat phrase 4 points 2 points  Total Score 10 points 4 points    Immunizations Immunization History  Administered Date(s) Administered   Fluad Quad(high Dose 65+) 02/02/2020, 01/01/2022   Influenza,inj,Quad PF,6+ Mos 11/14/2012   Influenza-Unspecified 11/12/2013, 11/19/2022   Moderna Sars-Covid-2 Vaccination 06/18/2019, 07/16/2019, 01/25/2020   PFIZER Comirnaty Alejos Top)Covid-19 Tri-Sucrose Vaccine 07/20/2022   PNEUMOCOCCAL CONJUGATE-20 02/17/2021   PPD Test 05/20/2015   Pfizer Covid-19 Vaccine Bivalent Booster 37yrs & up 02/07/2021   Pfizer(Comirnaty )Fall Seasonal Vaccine 12 years and older 01/01/2022, 11/19/2022   Unspecified SARS-COV-2 Vaccination 11/19/2022   Zoster Recombinant(Shingrix ) 07/30/2022    TDAP status: Due, Education has been provided regarding the importance of this vaccine. Advised may receive this vaccine at  local pharmacy or Health Dept. Aware to provide a copy of the vaccination record if obtained from local pharmacy or Health Dept. Verbalized acceptance and understanding.  Flu Vaccine status: Due, Education has been provided regarding the importance of this vaccine. Advised may receive this vaccine at local pharmacy or Health Dept. Aware to provide a copy of the vaccination record if obtained from local pharmacy or Health Dept. Verbalized acceptance and understanding.  Pneumococcal vaccine status: Up to date  Covid-19 vaccine status: Information provided on how to obtain vaccines.   Qualifies for Shingles Vaccine? Yes   Zostavax completed No   Shingrix  Completed?: No.    Education has been provided regarding the importance of this vaccine. Patient has been advised to call insurance company to determine out of pocket expense if they have not yet received this vaccine. Advised may also receive vaccine at local pharmacy or Health Dept. Verbalized acceptance and understanding.  Screening Tests Health Maintenance  Topic  Date Due   DTaP/Tdap/Td (1 - Tdap) Never done   Zoster Vaccines- Shingrix  (2 of 2) 09/24/2022   INFLUENZA VACCINE  09/13/2023   COVID-19 Vaccine (8 - Moderna risk 2024-25 season) 10/14/2023   Medicare Annual Wellness (AWV)  10/16/2024   Pneumococcal Vaccine: 50+ Years  Completed   HPV VACCINES  Aged Out   Meningococcal B Vaccine  Aged Out   DEXA SCAN  Discontinued    Health Maintenance  Health Maintenance Due  Topic Date Due   DTaP/Tdap/Td (1 - Tdap) Never done   Zoster Vaccines- Shingrix  (2 of 2) 09/24/2022   INFLUENZA VACCINE  09/13/2023   COVID-19 Vaccine (8 - Moderna risk 2024-25 season) 10/14/2023    Colorectal cancer screening: No longer required.   Mammogram status: No longer required due to  .  Bone Density  Education provided  Lung Cancer Screening: (Low Dose CT Chest recommended if Age 25-80 years, 20 pack-year currently smoking OR have quit w/in  15years.) does not qualify.   Lung Cancer Screening Referral:   Additional Screening:  Hepatitis C Screening: does not qualify  Vision Screening: Recommended annual ophthalmology exams for early detection of glaucoma and other disorders of the eye. Is the patient up to date with their annual eye exam?  Yes  Who is the provider or what is the name of the office in which the patient attends annual eye exams? [facility] If pt is not established with a provider, would they like to be referred to a provider to establish care? No .   Dental Screening: Recommended annual dental exams for proper oral hygiene    Community Resource Referral / Chronic Care Management: CRR required this visit?  No   CCM required this visit?  No     Plan:     I have personally reviewed and noted the following in the patient's chart:   Medical and social history Use of alcohol, tobacco or illicit drugs  Current medications and supplements including opioid prescriptions. Patient is not currently taking opioid prescriptions. Functional ability and status Nutritional status Physical activity Advanced directives List of other physicians Hospitalizations, surgeries, and ER visits in previous 12 months Vitals Screenings to include cognitive, depression, and falls Referrals and appointments  In addition, I have reviewed and discussed with patient certain preventive protocols, quality metrics, and best practice recommendations. A written personalized care plan for preventive services as well as general preventive health recommendations were provided to patient.     Mliss Graff, LPN   [id number]   After Visit Summary: (MyChart) Due to this being a telephonic visit, the after visit summary with patients personalized plan was offered to patient via MyChart   Nurse Notes:

## 2023-12-13 ENCOUNTER — Encounter: Payer: Self-pay | Admitting: Emergency Medicine

## 2023-12-13 ENCOUNTER — Ambulatory Visit: Attending: Emergency Medicine | Admitting: Emergency Medicine

## 2023-12-13 VITALS — BP 136/60 | HR 66 | Ht 63.0 in | Wt 178.2 lb

## 2023-12-13 DIAGNOSIS — I2510 Atherosclerotic heart disease of native coronary artery without angina pectoris: Secondary | ICD-10-CM

## 2023-12-13 DIAGNOSIS — I10 Essential (primary) hypertension: Secondary | ICD-10-CM

## 2023-12-13 DIAGNOSIS — I5022 Chronic systolic (congestive) heart failure: Secondary | ICD-10-CM

## 2023-12-13 DIAGNOSIS — Z79899 Other long term (current) drug therapy: Secondary | ICD-10-CM | POA: Diagnosis not present

## 2023-12-13 DIAGNOSIS — N1832 Chronic kidney disease, stage 3b: Secondary | ICD-10-CM

## 2023-12-13 DIAGNOSIS — I4729 Other ventricular tachycardia: Secondary | ICD-10-CM

## 2023-12-13 DIAGNOSIS — I350 Nonrheumatic aortic (valve) stenosis: Secondary | ICD-10-CM

## 2023-12-13 NOTE — Progress Notes (Signed)
 Cardiology Office Note:    Date:  12/13/2023  ID:  [name], DOB 09/08/1923, MRN [id number] PCP: Jerrie Gathers, DO  [facility] Providers Cardiologist:  Lynwood Schilling, MD Cardiology APP:  Rana Lum CROME, NP       Patient Profile:       Chief Complaint: 4-month follow-up History of Present Illness:  [name] is a 89 y.o. female with visit-pertinent history of  chronic HFpEF, hypertension, CKD, mild AS, history of GI bleed, minimal CAD per cath in 2003, brief NSVT   Cardiac catheterization in 2010 which showed nonobstructive disease.  She was admitted 09/2017 with chest for medical management due to age and kidney disease.  She had mild aortic stenosis with mean gradient of 11 mmHg by echo 09/2017.  Carotid duplex 09/2018 with bilateral 1-39% stenosis.  Echo 09/2019 LVEF 60 to 65%, grade 1 diastolic dysfunction, mild aortic stenosis with mean gradient 13.2 mmHg.  Previous long-term monitor 09/2019 showed sinus rhythm with 4 beat run of slow ventricular tachycardia rate 94 bpm which was asymptomatic.    Patient was admitted on 08/19/2023 for acute on chronic HFpEF with newly reduced EF.  She presented to the ED on 7/7 complaining of leg swelling.  She reported experience chronic edema since being hospitalized in August 2024 and has been taking p.o. Lasix  since then.  She also reports worsening DOE and acute on chronic chest discomfort.  Her BNP was 478 and chest x-ray showed no active disease.  Echocardiogram showed LVEF 45-50%, severe LVH, normal RV, BAE, mild MR, mild AS, dilated IVC.  When compared to echo from 09/2022 EF was 60 to 65%, mild LVH otherwise fairly similar.  EKG showed sinus bradycardia with first-degree AV block and LBBB.  She was started on IV Lasix .  Per patient report she was taking her p.o. Lasix  at home every other day and at some point began taking it only as needed.  Patient stated given her age she does not wish to add any more medications.  She is  followed closely by palliative medicine and she is currently a DNR.  No invasive testing is indicated and she was managed medically.  She was switched to p.o. Lasix  daily.  She was ultimately started on Imdur  30 mg daily.  Patient was last seen in clinic on 09/06/2023.  She was doing well without acute complaints.  Chest pains have resolved.  Reports no longer experiencing any orthopnea.  Reported her leg swelling continues which seems to be bothering her the most.  Patient brought in her home medications which were reviewed.  She did not have Lasix  as part of her daily regimen and was unsure of how long she been without Lasix  but at least several months.  The thought was the lack of loop diuretic therapy at home likely read to her recent exacerbation.  Patient was restarted on Lasix  20 mg daily.  Discussed the use of AI scribe software for clinical note transcription with the patient, who gave verbal consent to proceed.  History of Present Illness [name] is a 89 year old female with heart failure who presents for evaluation of medication management and leg swelling.  She experiences confusion regarding her medication, specifically furosemide , which she now understands is the same as Lasix . She has resumed taking furosemide  3 months ago.  Since restarting furosemide  her leg swelling has improved.  She denies any orthopnea or PND. She experiences intermittent chest discomfort at rest and on activity.  The pain is located in her left breast.  She will experience mild shortness of breath only on exertion but no shortness of breath at rest.  She denies any weight gain.  She becomes lightheaded when standing up too quickly, with no episodes of syncope.   Review of systems:  Please see the history of present illness. All other systems are reviewed and otherwise negative.      Studies Reviewed:        Echocardiogram 08/20/2023  1. Left ventricular ejection fraction, by estimation, is 45 to 50%.  The  left ventricle has mildly decreased function. The left ventricle  demonstrates global hypokinesis. There is severe concentric left  ventricular hypertrophy. Left ventricular diastolic   function could not be evaluated.   2. Right ventricular systolic function is normal. The right ventricular  size is normal.   3. Left atrial size was severely dilated.   4. Right atrial size was severely dilated.   5. The mitral valve is degenerative. Mild mitral valve regurgitation. The  mean mitral valve gradient is 2.0 mmHg with average heart rate of 52 bpm.  Moderate mitral annular calcification.   6. The aortic valve was not well visualized. Aortic valve regurgitation  is not visualized. Mild aortic valve stenosis. Aortic valve area, by VTI  measures 1.92 cm. Aortic valve Vmax measures 2.37 m/s.   7. The inferior vena cava is dilated in size with <50% respiratory  variability, suggesting right atrial pressure of 15 mmHg.    Echocardiogram 10/09/2022 1. Left ventricular ejection fraction, by estimation, is 60 to 65%. The  left ventricle has normal function. The left ventricle has no regional  wall motion abnormalities. There is mild concentric left ventricular  hypertrophy. Left ventricular diastolic  parameters are consistent with Grade III diastolic dysfunction  (restrictive).   2. Right ventricular systolic function is normal. The right ventricular  size is normal. Moderately increased right ventricular wall thickness.  There is severely elevated pulmonary artery systolic pressure. The  estimated right ventricular systolic  pressure is 72.8 mmHg.   3. Left atrial size was severely dilated.   4. Right atrial size was severely dilated.   5. The mitral valve is normal in structure. Mild to moderate mitral valve  regurgitation. No evidence of mitral stenosis.   6. Tricuspid valve regurgitation is moderate.   7. The aortic valve is tricuspid. There is mild calcification of the  aortic valve.  Aortic valve regurgitation is not visualized. Moderate  aortic valve stenosis. Aortic valve area, by VTI measures 1.13 cm. Aortic  valve mean gradient measures 14.8 mmHg.  Aortic valve Vmax measures 2.55 m/s.   8. The inferior vena cava is dilated in size with <50% respiratory  variability, suggesting right atrial pressure of 15 mmHg.   ZIO 11/12/2019 Sinus rhythm including NSR and sinus bradycardia Slowest HR recorded was 34 at 2:48 PMnon Sept. 21, 2021 She had a 4 beat run of slow ventricular tachycardia ( HR of 104 ) which was not symptomatic   Carotid duplex 09/18/2019 Right Carotid: Velocities in the right ICA are consistent with a 1-39%  stenosis.   Left Carotid: Velocities in the left ICA are consistent with a 1-39%  stenosis.   Vertebrals: Bilateral vertebral arteries demonstrate antegrade flow.   Risk Assessment/Calculations:              Physical Exam:   VS:  BP 136/60   Pulse 66   Ht 5' 3 (1.6 m)   Wt 178  lb 3.2 oz (80.8 kg)   SpO2 97%   BMI 31.57 kg/m    Wt Readings from Last 3 Encounters:  12/13/23 178 lb 3.2 oz (80.8 kg)  10/17/23 175 lb (79.4 kg)  09/26/23 176 lb 12.8 oz (80.2 kg)    GEN: Well nourished, well developed in no acute distress NECK: No JVD; No carotid bruits CARDIAC: RRR, no murmurs, rubs, gallops RESPIRATORY:  Clear to auscultation without rales, wheezing or rhonchi  ABDOMEN: Soft, non-tender, non-distended EXTREMITIES:  No edema; No acute deformity      Assessment and Plan:  Chronic HFmrEF Echocardiogram 09/2022 LVEF 60 to 65%, no RWMA, mild LVH, grade 3 DD Echocardiogram 08/2023 with LVEF 45 to 50%, global hypokinesis, severe LVH Admitted 08/2023 for acute on chronic HF with newly reduced EF with net -3.2L - NYHA class II  - Today she appears euvolemic.  Weight has been stable.  Chronic DOE has been stable and unchanged.  LEE has improved since starting on furosemide .  No orthopnea or PND - Patient prefers not to further titrate GDMT  which I feel is reasonable as she remains stable - Continue furosemide  20 mg daily and metoprolol  succinate 12.5 mg daily - Can consider repeat echocardiogram at follow-up visit if felt clinically indicated   Mild aortic valve stenosis Mild mitral valve regurgitation Echocardiogram 08/2023 showed mild mitral valve regurgitation and mild aortic valve stenosis - No chest pain or syncope today.  No acute changes in chronic dyspnea - There is no indication for intervention at this time   Coronary artery disease Nonobstructive by cardiac catheterization in 2010 She has been managed medically for angina since 2019 due to advanced age and chronic kidney disease - She will occasionally have left sided breast pain at rest.  She denies any exertional symptoms.  No symptoms to suggest active angina, no indication for intervention at this time - She is not on aspirin  due to history of GI bleed and not on lipid-lowering therapy - Continue Imdur  30 mg daily   Hypertension Blood pressure today is 136/60 and well-controlled - Continue metoprolol  XL 12.5 mg daily - Encouraged home BP monitoring   NSVT ZIO 04/2019 showed 4 beat run of slow ventricular tachycardia (HR 104) - Quiescent without palpitations, syncope, tachycardia - Continue metoprolol  XL 12.5 mg daily   Chronic kidney disease 3b Creatinine 1.41 and GFR 34 on 09/2023 - Remains stable - BMET today      Dispo:  Return in about 3 months (around 03/14/2024).  Signed, Lum LITTIE Louis, NP

## 2023-12-13 NOTE — Patient Instructions (Signed)
 Medication Instructions:  NO CHANGES  Lab Work: BMET TO BE DONE TODAY.  Testing/Procedures: NONE  Follow-Up: At [facility], you and your health needs are our priority.  As part of our continuing mission to provide you with exceptional heart care, our providers are all part of one team.  This team includes your primary Cardiologist (physician) and Advanced Practice Providers or APPs (Physician Assistants and Nurse Practitioners) who all work together to provide you with the care you need, when you need it.  Your next appointment:   3 MONTHS  Provider:   Lynwood Schilling, MD    We recommend signing up for the patient portal called MyChart.  Sign up information is provided on this After Visit Summary.  MyChart is used to connect with patients for Virtual Visits (Telemedicine).  Patients are able to view lab/test results, encounter notes, upcoming appointments, etc.  Non-urgent messages can be sent to your provider as well.   To learn more about what you can do with MyChart, go to [email or web address].

## 2023-12-14 LAB — BASIC METABOLIC PANEL WITH GFR
BUN/Creatinine Ratio: 15 (ref 12–28)
BUN: 22 mg/dL (ref 10–36)
CO2: 23 mmol/L (ref 20–29)
Calcium: 9.8 mg/dL (ref 8.7–10.3)
Chloride: 102 mmol/L (ref 96–106)
Creatinine, Ser: 1.43 mg/dL — ABNORMAL HIGH (ref 0.57–1.00)
Glucose: 89 mg/dL (ref 70–99)
Potassium: 5.2 mmol/L (ref 3.5–5.2)
Sodium: 143 mmol/L (ref 134–144)
eGFR: 33 mL/min/1.73 — ABNORMAL LOW (ref >59)

## 2023-12-16 ENCOUNTER — Ambulatory Visit: Payer: Self-pay | Admitting: Emergency Medicine

## 2023-12-16 MED ORDER — ISOSORBIDE MONONITRATE ER 30 MG PO TB24: 30.0000 mg | ORAL_TABLET | Freq: Every day | ORAL | 3 refills | Status: AC

## 2023-12-16 MED ORDER — METOPROLOL SUCCINATE ER 25 MG PO TB24: 12.5000 mg | ORAL_TABLET | Freq: Every day | ORAL | 3 refills | Status: AC

## 2023-12-26 ENCOUNTER — Ambulatory Visit

## 2023-12-26 VITALS — BP 140/64 | HR 64 | Wt 178.0 lb

## 2023-12-26 DIAGNOSIS — Z0000 Encounter for general adult medical examination without abnormal findings: Secondary | ICD-10-CM

## 2023-12-26 DIAGNOSIS — G8929 Other chronic pain: Secondary | ICD-10-CM

## 2023-12-26 DIAGNOSIS — M1711 Unilateral primary osteoarthritis, right knee: Secondary | ICD-10-CM | POA: Diagnosis not present

## 2023-12-26 DIAGNOSIS — M545 Low back pain, unspecified: Secondary | ICD-10-CM | POA: Diagnosis not present

## 2023-12-26 DIAGNOSIS — D649 Anemia, unspecified: Secondary | ICD-10-CM | POA: Diagnosis not present

## 2023-12-26 NOTE — Progress Notes (Signed)
    SUBJECTIVE:   CHIEF COMPLAINT / HPI:   Patient presents today for f/u on chronic conditions. Compliant with all medications. Reports low back pain and R knee pain which improve with Tylenol . States she has tried to coordinate PT but is unable to go due to transportation issues. Has previously had home health PT and would like to establish this again to help with mobility  Also repots change in stool color to green. Stool is normally dark because she takes iron pills but has recently been green in color. Denies change in stool quality or increase in green vegetables in diet. No blood in the stool.  PERTINENT  PMH / PSH: HFpEF, CKD, anemia, HTN, CAD  OBJECTIVE:   BP (!) 140/64   Pulse 64   Wt 178 lb (80.7 kg)   SpO2 98%   BMI 31.53 kg/m    General: Alert, well-appearing female in NAD.  HEENT: No sign of trauma, EOM grossly intact. Cardiovascular: RRR Pulmonary: Normal WOB. CTAB with no w/c/r present. Abdomen: Soft, non-tender, non-distended. Extremities: Warm and well-perfused, without cyanosis or edema.  ASSESSMENT/PLAN:   Assessment & Plan Chronic bilateral low back pain, unspecified whether sciatica present Osteoarthritis of right knee, unspecified osteoarthritis type History of chronic LBP and R knee pain. Unable to go to PT 2/2 transportation issus and would benefit from home PT. - Referral placed for home health PT to help with mobility and pain Routine adult health maintenance - Up to date on shingles and tdap  - Patient unsure if she has received COVID vaccine for this season. Encouraged her to go to her pharamcy for proof of vaccination and bring to next visit - Encouraged patient to get RSV vaccine at pharmacy  Patient does have a history of diverticular bleed but denies BRBPR, change in stool quality or hematochezia. She does have dark stools in general but she uses ferrous sulfate  daily which is the cause. No concern for acute bleed at this time. Return  precautions discussed.  Brenda Winthrop, DO [facility]

## 2023-12-26 NOTE — Patient Instructions (Signed)
 Thank you for visiting the clinic today, it was good to see you!  Our plans for today: - Go to your pharmacy and ask them for proof of COVID vaccine and bring with you to your next visit - Get the RSV vaccine next time you go to the pharmacy - We placed a referral for home health so they can help with physical therapy  Please arrive 15 minutes PRIOR to your next scheduled appointment time! If you do not, this affects OTHER patients' care.  For any questions, please call the office at [phone] or send me a message in MyChart.  It was a pleasure to take care of you today. Have a great day!  Kameria Canizares, DO [facility] Resident, PGY-1  -------------------------------------------------------------------------------  Do you need your medications delivered to your home?   We'll send your prescription to the [facility] for delivery.          Address: [address] [place], [place], [place] [address]          Phone: [phone]  Please call the [facility] to speak with a pharmacist and set up your home medication delivery. If you have any questions, feel free to contact us  -- we're happy to help!  Other [facility] that offer affordable prices on both prescriptions and over-the-counter items, as well as convenient services like vaccinations, are  [facility], at [facility]         Address:  [address], [place], [place] [address]         Phone: [phone]  [facility], located in the [facility]        Address: [address] [place], [place] [address]        Phone: [phone]  [facility], at [facility]       Address: [address], [place], [place] [address]       Phone: [phone]  [facility], at [facility]       Address: [address] Floor, [place], [place] [address]        Phone: [phone]

## 2023-12-28 DIAGNOSIS — D649 Anemia, unspecified: Secondary | ICD-10-CM | POA: Insufficient documentation

## 2023-12-28 NOTE — Assessment & Plan Note (Signed)
-   Up to date on shingles and tdap  - Patient unsure if she has received COVID vaccine for this season. Encouraged her to go to her pharamcy for proof of vaccination and bring to next visit - Encouraged patient to get RSV vaccine at pharmacy

## 2023-12-28 NOTE — Assessment & Plan Note (Signed)
 History of chronic LBP and R knee pain. Unable to go to PT 2/2 transportation issus and would benefit from home PT. - Referral placed for home health PT to help with mobility and pain

## 2024-02-24 DIAGNOSIS — D175 Benign lipomatous neoplasm of intra-abdominal organs: Secondary | ICD-10-CM | POA: Insufficient documentation

## 2024-02-24 DIAGNOSIS — K589 Irritable bowel syndrome without diarrhea: Secondary | ICD-10-CM | POA: Insufficient documentation

## 2024-02-24 DIAGNOSIS — R143 Flatulence: Secondary | ICD-10-CM | POA: Insufficient documentation

## 2024-02-24 DIAGNOSIS — D509 Iron deficiency anemia, unspecified: Secondary | ICD-10-CM | POA: Insufficient documentation

## 2024-02-24 DIAGNOSIS — K8020 Calculus of gallbladder without cholecystitis without obstruction: Secondary | ICD-10-CM | POA: Insufficient documentation

## 2024-02-24 DIAGNOSIS — K5909 Other constipation: Secondary | ICD-10-CM | POA: Insufficient documentation

## 2024-02-24 DIAGNOSIS — R634 Abnormal weight loss: Secondary | ICD-10-CM | POA: Insufficient documentation

## 2024-02-24 DIAGNOSIS — M1990 Unspecified osteoarthritis, unspecified site: Secondary | ICD-10-CM | POA: Insufficient documentation

## 2024-02-24 DIAGNOSIS — K5792 Diverticulitis of intestine, part unspecified, without perforation or abscess without bleeding: Secondary | ICD-10-CM | POA: Insufficient documentation

## 2024-02-24 DIAGNOSIS — N183 Chronic kidney disease, stage 3 unspecified: Secondary | ICD-10-CM | POA: Insufficient documentation

## 2024-02-24 DIAGNOSIS — R140 Abdominal distension (gaseous): Secondary | ICD-10-CM | POA: Insufficient documentation

## 2024-03-05 DIAGNOSIS — I5021 Acute systolic (congestive) heart failure: Secondary | ICD-10-CM | POA: Insufficient documentation

## 2024-03-05 NOTE — Progress Notes (Signed)
 " Cardiology Office Note:   Date:  03/06/2024  ID:  [name], DOB 1923/10/23, MRN [id number] PCP: Jerrie Gathers, DO  [facility] Providers Cardiologist:  Lynwood Schilling, MD Cardiology APP:  Rana Lum CROME, NP {  History of Present Illness:   [name] is a 89 y.o. female with a hx of mild aortic stenosis, chronic kidney disease stage III, hypertension and history of GI bleed.  She has minimal coronary artery disease on distant cath.  She previously saw Dr. Alveta.  Looks like she was in the hospital around 2019 with some chest pain.  She has had palpitations and some arrhythmias to include very brief runs of nonsustained VT on distant monitoring.  She has some first-degree AV block and over the years beta-blockers were reduced because of this.  She has had some mild aortic stenosis though with well-preserved left ventricular function.  She has a mildly reduced ejection fraction and some renal insufficiency.  Since she was last seen she has had no new problems.  She lives by herself.  Her only living child is actually 71 and has poor health.  The patient herself gets around and actually still drives.  She walks with a cane.  She does get short of breath walking mild distance on level ground but this has been chronic.  She is not describing PND or orthopnea.  She does get some tingling in her chest and has been told to take Gaviscon for this and will occasionally also take a nitroglycerin  but this is a stable pattern.  She is not describing neck or arm discomfort.  She has not had any weight gain or edema.  She was in the hospital last July 2025 with some volume overload.  She has not had new symptoms since then and she seems to be doing well with the current dose of diuretic  ROS: As stated in the HPI and negative for all other systems.  Studies Reviewed:    EKG:         Risk Assessment/Calculations:     Physical Exam:   VS:  BP (!) 142/70   Pulse 67   Ht 5' 3  (1.6 m)   Wt 180 lb (81.6 kg)   SpO2 99%   BMI 31.89 kg/m    Wt Readings from Last 3 Encounters:  03/06/24 180 lb (81.6 kg)  12/26/23 178 lb (80.7 kg)  12/13/23 178 lb 3.2 oz (80.8 kg)     GEN:   Frail but looking pretty well for her stated age, well developed in no acute distress NECK: No JVD; No carotid bruits CARDIAC: RRR, 3 out of 6 systolic murmur radiating slightly at the [facility] tract and early peaking, no diastolic murmurs, rubs, gallops RESPIRATORY:  Clear to auscultation without rales, wheezing or rhonchi  ABDOMEN: Soft, non-tender, non-distended EXTREMITIES:  No edema; No deformity   ASSESSMENT AND PLAN:   Chronic HFmrEF: She seems to be euvolemic.  We talked about taking an extra 20 mg of Lasix  if she has any increased shortness of breath.  At this point I think that conservative management given her advanced age is very appropriate and she is doing well with this.  No further imaging.   Mild aortic valve stenosis: This should be managed medically.  Are not planning further echoes.  Mild mitral valve regurgitation: Again she would not be a candidate for any intervention given her advanced age and is doing well.  No change in therapy.  Coronary  artery disease:  She had nonobstructive by cardiac catheterization in 2010.  She seems to have a stable chest pain pattern that has nonanginal greater than anginal features but as needed nitroglycerin  is still reasonable.  She will let me know if she has to take more of this.   Hypertension: Her blood pressure is at target.  No change in therapy.  NSVT: She had this in 2021 but has no ongoing overt symptoms and could not tolerate higher dose beta-blocker.  No change in therapy.  Chronic kidney disease 3b: Creatinine was 1.43.  This was in October of last year.  No change in therapy.      Follow up with me in 1 year  Signed, Lynwood Schilling, MD   "

## 2024-03-06 ENCOUNTER — Encounter: Payer: Self-pay | Admitting: Cardiology

## 2024-03-06 ENCOUNTER — Ambulatory Visit: Attending: Cardiology | Admitting: Cardiology

## 2024-03-06 VITALS — BP 142/70 | HR 67 | Ht 63.0 in | Wt 180.0 lb

## 2024-03-06 DIAGNOSIS — I4729 Other ventricular tachycardia: Secondary | ICD-10-CM | POA: Diagnosis not present

## 2024-03-06 DIAGNOSIS — I340 Nonrheumatic mitral (valve) insufficiency: Secondary | ICD-10-CM | POA: Diagnosis not present

## 2024-03-06 DIAGNOSIS — N1832 Chronic kidney disease, stage 3b: Secondary | ICD-10-CM | POA: Diagnosis not present

## 2024-03-06 DIAGNOSIS — I2510 Atherosclerotic heart disease of native coronary artery without angina pectoris: Secondary | ICD-10-CM | POA: Diagnosis not present

## 2024-03-06 DIAGNOSIS — I350 Nonrheumatic aortic (valve) stenosis: Secondary | ICD-10-CM

## 2024-03-06 DIAGNOSIS — I5021 Acute systolic (congestive) heart failure: Secondary | ICD-10-CM

## 2024-03-06 NOTE — Patient Instructions (Signed)

## 2024-03-19 ENCOUNTER — Ambulatory Visit: Payer: Self-pay

## 2024-04-03 ENCOUNTER — Ambulatory Visit
